# Patient Record
Sex: Female | Born: 1970 | Race: Black or African American | Hispanic: No | Marital: Married | Smoking: Never smoker
Health system: Southern US, Community
[De-identification: ages and names within clinical notes are randomized; demographics above are authoritative.]

## PROBLEM LIST (undated history)

## (undated) DIAGNOSIS — Z940 Kidney transplant status: Secondary | ICD-10-CM

## (undated) DIAGNOSIS — Z9889 Other specified postprocedural states: Secondary | ICD-10-CM

## (undated) DIAGNOSIS — E119 Type 2 diabetes mellitus without complications: Secondary | ICD-10-CM

## (undated) DIAGNOSIS — E785 Hyperlipidemia, unspecified: Secondary | ICD-10-CM

## (undated) DIAGNOSIS — E11319 Type 2 diabetes mellitus with unspecified diabetic retinopathy without macular edema: Secondary | ICD-10-CM

## (undated) DIAGNOSIS — I2510 Atherosclerotic heart disease of native coronary artery without angina pectoris: Secondary | ICD-10-CM

## (undated) DIAGNOSIS — I739 Peripheral vascular disease, unspecified: Secondary | ICD-10-CM

## (undated) DIAGNOSIS — I10 Essential (primary) hypertension: Secondary | ICD-10-CM

## (undated) DIAGNOSIS — N289 Disorder of kidney and ureter, unspecified: Secondary | ICD-10-CM

## (undated) DIAGNOSIS — Z8669 Personal history of other diseases of the nervous system and sense organs: Secondary | ICD-10-CM

## (undated) DIAGNOSIS — Z8673 Personal history of transient ischemic attack (TIA), and cerebral infarction without residual deficits: Secondary | ICD-10-CM

## (undated) DIAGNOSIS — K5900 Constipation, unspecified: Secondary | ICD-10-CM

## (undated) DIAGNOSIS — J189 Pneumonia, unspecified organism: Secondary | ICD-10-CM

## (undated) DIAGNOSIS — D649 Anemia, unspecified: Secondary | ICD-10-CM

## (undated) HISTORY — PX: CARDIAC CATHETERIZATION: SHX172

## (undated) HISTORY — PX: EYE SURGERY: SHX253

---

## 1997-07-19 ENCOUNTER — Ambulatory Visit (HOSPITAL_COMMUNITY): Admission: RE | Admit: 1997-07-19 | Discharge: 1997-07-19 | Payer: Self-pay | Admitting: Obstetrics and Gynecology

## 1997-11-18 ENCOUNTER — Inpatient Hospital Stay (HOSPITAL_COMMUNITY): Admission: AD | Admit: 1997-11-18 | Discharge: 1997-11-18 | Payer: Self-pay | Admitting: Obstetrics and Gynecology

## 1997-11-25 ENCOUNTER — Inpatient Hospital Stay (HOSPITAL_COMMUNITY): Admission: AD | Admit: 1997-11-25 | Discharge: 1997-11-25 | Payer: Self-pay | Admitting: *Deleted

## 1997-12-08 ENCOUNTER — Inpatient Hospital Stay (HOSPITAL_COMMUNITY): Admission: AD | Admit: 1997-12-08 | Discharge: 1997-12-13 | Payer: Self-pay | Admitting: Obstetrics and Gynecology

## 1997-12-13 ENCOUNTER — Encounter (HOSPITAL_COMMUNITY): Admission: RE | Admit: 1997-12-13 | Discharge: 1998-03-13 | Payer: Self-pay | Admitting: Obstetrics and Gynecology

## 1998-03-16 ENCOUNTER — Encounter (HOSPITAL_COMMUNITY): Admission: RE | Admit: 1998-03-16 | Discharge: 1998-06-14 | Payer: Self-pay | Admitting: *Deleted

## 1998-12-14 ENCOUNTER — Emergency Department (HOSPITAL_COMMUNITY): Admission: EM | Admit: 1998-12-14 | Discharge: 1998-12-14 | Payer: Self-pay | Admitting: *Deleted

## 1998-12-20 ENCOUNTER — Other Ambulatory Visit: Admission: RE | Admit: 1998-12-20 | Discharge: 1998-12-20 | Payer: Self-pay | Admitting: Obstetrics and Gynecology

## 2002-01-07 ENCOUNTER — Other Ambulatory Visit: Admission: RE | Admit: 2002-01-07 | Discharge: 2002-01-07 | Payer: Self-pay | Admitting: Obstetrics and Gynecology

## 2002-07-17 ENCOUNTER — Emergency Department (HOSPITAL_COMMUNITY): Admission: EM | Admit: 2002-07-17 | Discharge: 2002-07-17 | Payer: Self-pay | Admitting: Emergency Medicine

## 2002-07-17 ENCOUNTER — Encounter: Payer: Self-pay | Admitting: Emergency Medicine

## 2003-05-25 ENCOUNTER — Encounter: Admission: RE | Admit: 2003-05-25 | Discharge: 2003-05-25 | Payer: Self-pay | Admitting: Internal Medicine

## 2005-01-25 ENCOUNTER — Encounter: Admission: RE | Admit: 2005-01-25 | Discharge: 2005-01-25 | Payer: Self-pay | Admitting: Internal Medicine

## 2006-01-18 ENCOUNTER — Emergency Department (HOSPITAL_COMMUNITY): Admission: EM | Admit: 2006-01-18 | Discharge: 2006-01-18 | Payer: Self-pay | Admitting: Emergency Medicine

## 2006-05-19 ENCOUNTER — Emergency Department (HOSPITAL_COMMUNITY): Admission: EM | Admit: 2006-05-19 | Discharge: 2006-05-20 | Payer: Self-pay | Admitting: Emergency Medicine

## 2006-05-25 ENCOUNTER — Emergency Department (HOSPITAL_COMMUNITY): Admission: EM | Admit: 2006-05-25 | Discharge: 2006-05-25 | Payer: Self-pay | Admitting: Emergency Medicine

## 2006-05-29 ENCOUNTER — Encounter: Admission: RE | Admit: 2006-05-29 | Discharge: 2006-07-11 | Payer: Self-pay | Admitting: Internal Medicine

## 2006-11-01 ENCOUNTER — Encounter: Admission: RE | Admit: 2006-11-01 | Discharge: 2006-11-01 | Payer: Self-pay | Admitting: Obstetrics

## 2008-06-24 ENCOUNTER — Emergency Department (HOSPITAL_COMMUNITY): Admission: EM | Admit: 2008-06-24 | Discharge: 2008-06-24 | Payer: Self-pay | Admitting: Emergency Medicine

## 2009-02-26 HISTORY — PX: OTHER SURGICAL HISTORY: SHX169

## 2009-04-28 ENCOUNTER — Ambulatory Visit: Payer: Self-pay | Admitting: Vascular Surgery

## 2009-05-03 ENCOUNTER — Ambulatory Visit: Payer: Self-pay | Admitting: Vascular Surgery

## 2009-05-03 ENCOUNTER — Ambulatory Visit (HOSPITAL_COMMUNITY): Admission: RE | Admit: 2009-05-03 | Discharge: 2009-05-03 | Payer: Self-pay | Admitting: Vascular Surgery

## 2009-05-10 ENCOUNTER — Ambulatory Visit: Payer: Self-pay | Admitting: Vascular Surgery

## 2009-06-02 ENCOUNTER — Encounter: Admission: RE | Admit: 2009-06-02 | Discharge: 2009-06-02 | Payer: Self-pay | Admitting: Nephrology

## 2009-06-09 ENCOUNTER — Ambulatory Visit: Payer: Self-pay | Admitting: Vascular Surgery

## 2010-05-22 LAB — GLUCOSE, CAPILLARY: Glucose-Capillary: 164 mg/dL — ABNORMAL HIGH (ref 70–99)

## 2010-05-22 LAB — POCT I-STAT 4, (NA,K, GLUC, HGB,HCT)
HCT: 35 % — ABNORMAL LOW (ref 36.0–46.0)
Potassium: 4.7 mEq/L (ref 3.5–5.1)

## 2010-06-04 ENCOUNTER — Other Ambulatory Visit (HOSPITAL_COMMUNITY): Payer: Self-pay | Admitting: Nephrology

## 2010-06-04 ENCOUNTER — Ambulatory Visit (HOSPITAL_COMMUNITY)
Admission: RE | Admit: 2010-06-04 | Discharge: 2010-06-04 | Disposition: A | Discharge status: Home / Self Care | Payer: 59 | Source: Ambulatory Visit | Attending: Nephrology | Admitting: Nephrology

## 2010-06-04 DIAGNOSIS — N186 End stage renal disease: Secondary | ICD-10-CM

## 2010-06-04 DIAGNOSIS — E119 Type 2 diabetes mellitus without complications: Secondary | ICD-10-CM | POA: Insufficient documentation

## 2010-06-04 DIAGNOSIS — Y832 Surgical operation with anastomosis, bypass or graft as the cause of abnormal reaction of the patient, or of later complication, without mention of misadventure at the time of the procedure: Secondary | ICD-10-CM | POA: Insufficient documentation

## 2010-06-04 DIAGNOSIS — T82898A Other specified complication of vascular prosthetic devices, implants and grafts, initial encounter: Secondary | ICD-10-CM | POA: Insufficient documentation

## 2010-06-04 DIAGNOSIS — I120 Hypertensive chronic kidney disease with stage 5 chronic kidney disease or end stage renal disease: Secondary | ICD-10-CM | POA: Insufficient documentation

## 2010-06-04 MED ORDER — IOHEXOL 300 MG/ML  SOLN
100.0000 mL | Freq: Once | INTRAMUSCULAR | Status: AC | PRN
  Administered 2010-06-04: 40 mL via INTRAVENOUS

## 2010-06-05 ENCOUNTER — Other Ambulatory Visit (HOSPITAL_COMMUNITY): Payer: Self-pay

## 2010-06-05 ENCOUNTER — Emergency Department (HOSPITAL_COMMUNITY)
Admission: EM | Admit: 2010-06-05 | Discharge: 2010-06-06 | Disposition: A | Discharge status: Other Acute Inpatient Hospital | Payer: 59 | Attending: Emergency Medicine | Admitting: Emergency Medicine

## 2010-06-05 DIAGNOSIS — R45851 Suicidal ideations: Secondary | ICD-10-CM | POA: Insufficient documentation

## 2010-06-05 DIAGNOSIS — Z794 Long term (current) use of insulin: Secondary | ICD-10-CM | POA: Insufficient documentation

## 2010-06-05 DIAGNOSIS — F329 Major depressive disorder, single episode, unspecified: Secondary | ICD-10-CM | POA: Insufficient documentation

## 2010-06-05 DIAGNOSIS — E785 Hyperlipidemia, unspecified: Secondary | ICD-10-CM | POA: Insufficient documentation

## 2010-06-05 DIAGNOSIS — F3289 Other specified depressive episodes: Secondary | ICD-10-CM | POA: Insufficient documentation

## 2010-06-05 DIAGNOSIS — E119 Type 2 diabetes mellitus without complications: Secondary | ICD-10-CM | POA: Insufficient documentation

## 2010-06-05 DIAGNOSIS — I120 Hypertensive chronic kidney disease with stage 5 chronic kidney disease or end stage renal disease: Secondary | ICD-10-CM | POA: Insufficient documentation

## 2010-06-05 DIAGNOSIS — Z79899 Other long term (current) drug therapy: Secondary | ICD-10-CM | POA: Insufficient documentation

## 2010-06-05 DIAGNOSIS — N186 End stage renal disease: Secondary | ICD-10-CM | POA: Insufficient documentation

## 2010-06-05 LAB — URINE MICROSCOPIC-ADD ON

## 2010-06-05 LAB — URINALYSIS, ROUTINE W REFLEX MICROSCOPIC
Glucose, UA: 250 mg/dL — AB
Hgb urine dipstick: NEGATIVE
Protein, ur: >300 mg/dL — AB
pH: 8 (ref 5.0–8.0)

## 2010-06-05 LAB — DIFFERENTIAL
Basophils Relative: 0 % (ref 0–1)
Eosinophils Absolute: 0 10*3/uL (ref 0.0–0.7)
Eosinophils Relative: 0 % (ref 0–5)
Lymphocytes Relative: 13 % (ref 12–46)
Lymphs Abs: 1.9 10*3/uL (ref 0.7–4.0)
Monocytes Absolute: 1.1 10*3/uL — ABNORMAL HIGH (ref 0.1–1.0)
Neutro Abs: 12 10*3/uL — ABNORMAL HIGH (ref 1.7–7.7)
Neutrophils Relative %: 80 % — ABNORMAL HIGH (ref 43–77)

## 2010-06-05 LAB — CBC
HCT: 32.2 % — ABNORMAL LOW (ref 36.0–46.0)
MCH: 28.3 pg (ref 26.0–34.0)
Platelets: 470 10*3/uL — ABNORMAL HIGH (ref 150–400)
RBC: 3.85 MIL/uL — ABNORMAL LOW (ref 3.87–5.11)
RDW: 13.2 % (ref 11.5–15.5)

## 2010-06-05 LAB — RAPID URINE DRUG SCREEN, HOSP PERFORMED
Barbiturates: NOT DETECTED
Benzodiazepines: POSITIVE — AB

## 2010-06-05 LAB — ETHANOL: Alcohol, Ethyl (B): <5 mg/dL (ref 0–10)

## 2010-06-06 ENCOUNTER — Ambulatory Visit (HOSPITAL_COMMUNITY)
Admission: RE | Admit: 2010-06-06 | Discharge: 2010-06-06 | Disposition: A | Discharge status: Home / Self Care | Payer: 59 | Source: Ambulatory Visit | Attending: Nephrology | Admitting: Nephrology

## 2010-06-06 ENCOUNTER — Inpatient Hospital Stay (HOSPITAL_COMMUNITY)
Admission: RE | Admit: 2010-06-06 | Discharge: 2010-06-07 | DRG: 881 | Disposition: A | Discharge status: Home / Self Care | Payer: 59 | Source: Ambulatory Visit | Attending: Psychiatry | Admitting: Psychiatry

## 2010-06-06 DIAGNOSIS — E119 Type 2 diabetes mellitus without complications: Secondary | ICD-10-CM

## 2010-06-06 DIAGNOSIS — T46904A Poisoning by unspecified agents primarily affecting the cardiovascular system, undetermined, initial encounter: Secondary | ICD-10-CM

## 2010-06-06 DIAGNOSIS — T50992A Poisoning by other drugs, medicaments and biological substances, intentional self-harm, initial encounter: Secondary | ICD-10-CM

## 2010-06-06 DIAGNOSIS — F3289 Other specified depressive episodes: Secondary | ICD-10-CM

## 2010-06-06 DIAGNOSIS — N186 End stage renal disease: Secondary | ICD-10-CM | POA: Insufficient documentation

## 2010-06-06 DIAGNOSIS — N189 Chronic kidney disease, unspecified: Secondary | ICD-10-CM

## 2010-06-06 DIAGNOSIS — R45851 Suicidal ideations: Secondary | ICD-10-CM

## 2010-06-06 DIAGNOSIS — F329 Major depressive disorder, single episode, unspecified: Principal | ICD-10-CM

## 2010-06-06 LAB — GLUCOSE, CAPILLARY: Glucose-Capillary: 70 mg/dL (ref 70–99)

## 2010-06-06 LAB — COMPREHENSIVE METABOLIC PANEL
Albumin: 4 g/dL (ref 3.5–5.2)
Alkaline Phosphatase: 61 U/L (ref 39–117)
CO2: 28 mEq/L (ref 19–32)
Chloride: 97 mEq/L (ref 96–112)
GFR calc Af Amer: 12 mL/min — ABNORMAL LOW (ref >60)
GFR calc non Af Amer: 10 mL/min — ABNORMAL LOW (ref >60)
Glucose, Bld: 170 mg/dL — ABNORMAL HIGH (ref 70–99)
Total Protein: 7.9 g/dL (ref 6.0–8.3)

## 2010-06-06 LAB — RENAL FUNCTION PANEL
CO2: 27 mEq/L (ref 19–32)
Chloride: 97 mEq/L (ref 96–112)
GFR calc Af Amer: 8 mL/min — ABNORMAL LOW (ref >60)
GFR calc non Af Amer: 7 mL/min — ABNORMAL LOW (ref >60)
Glucose, Bld: 225 mg/dL — ABNORMAL HIGH (ref 70–99)
Potassium: 3.3 mEq/L — ABNORMAL LOW (ref 3.5–5.1)
Sodium: 137 mEq/L (ref 135–145)

## 2010-06-06 LAB — CBC
HCT: 31 % — ABNORMAL LOW (ref 36.0–46.0)
Hemoglobin: 10.2 g/dL — ABNORMAL LOW (ref 12.0–15.0)
RBC: 3.59 MIL/uL — ABNORMAL LOW (ref 3.87–5.11)
WBC: 10.8 10*3/uL — ABNORMAL HIGH (ref 4.0–10.5)

## 2010-06-07 LAB — GLUCOSE, CAPILLARY
Glucose-Capillary: 155 mg/dL — ABNORMAL HIGH (ref 70–99)
Glucose-Capillary: 270 mg/dL — ABNORMAL HIGH (ref 70–99)

## 2010-06-13 ENCOUNTER — Ambulatory Visit: Payer: 59 | Admitting: Vascular Surgery

## 2010-06-28 ENCOUNTER — Encounter (INDEPENDENT_AMBULATORY_CARE_PROVIDER_SITE_OTHER): Payer: 59

## 2010-06-28 ENCOUNTER — Ambulatory Visit: Payer: 59 | Admitting: Vascular Surgery

## 2010-06-28 ENCOUNTER — Ambulatory Visit (INDEPENDENT_AMBULATORY_CARE_PROVIDER_SITE_OTHER): Payer: 59 | Admitting: Vascular Surgery

## 2010-06-28 DIAGNOSIS — N186 End stage renal disease: Secondary | ICD-10-CM

## 2010-06-28 DIAGNOSIS — Z01810 Encounter for preprocedural cardiovascular examination: Secondary | ICD-10-CM

## 2010-06-29 NOTE — Assessment & Plan Note (Signed)
OFFICE VISIT  Morgan [name], Morgan [name] DOB:  11/09/1970                                       06/28/2010 [id number]  I saw patient in the office today to evaluate her for new access.  This is a pleasant 40 year old woman who had a left upper arm basilic vein transposition placed on 05/03/09.  This had been working nicely; however, she presented in April of this year with an occluded fistula. She underwent thrombolysis by interventional radiologist.  It was noted that the vein was diffusely small in caliber with significant diffuse stenoses.  There was no central venous stenosis.  The patient underwent balloon dilatation of a diffusely stenotic basilic vein, but the graft occluded.  There was noted to be some contrast extravasation at the time of the procedure also.  They did not feel that the fistula could be salvaged, and she had a dialysis catheter placed.  This was on 06/06/10. She has been using her dialysis catheter for access and comes in to be evaluated for new access.  Of note, she has had no recent uremic symptoms.  Specifically, she denies fatigue, anorexia, nausea, vomiting, palpitations, or significant shortness of breath.  SOCIAL HISTORY:  She is married.  She has 1 child.  She does not use tobacco.  REVIEW OF SYSTEMS:  CARDIOVASCULAR:  She had no chest pain, chest pressure, palpitations or arrhythmias. PULMONARY:  She has had no productive cough, bronchitis, asthma or wheezing.  PHYSICAL EXAMINATION:  This is a pleasant 40 year old woman who appears her stated age.  Temperature is 97.4, blood pressure 115/64, heart rate is 101.  Lungs are clear bilaterally to auscultation.  Cardiovascular: She has a regular rate and rhythm.  She has palpable brachial and radial pulse bilaterally.  Abdomen:  Soft and nontender with normal-pitched bowel sounds.  Neurologic:  She has no focal weakness or paresthesias. Skin exam reveals an occluded left upper  arm basilic vein transposition with a sclerotic, thrombosed basilic vein.  I have reviewed her fistulogram from April, which shows multiple areas of stenosis on the fistula diffusely which were ballooned but the graft occluded.  I have also independently interpreted a vein map of the right upper extremity today which shows that her forearm and upper arm cephalic vein in the right arm are very small.  Likewise, the basilic vein is quite small on the right.  I do not see a good option for a fistula in the right arm.  I have recommend we place an AV graft in the left arm.  We can then remove the catheter on the right in 1 month.  We have discussed the procedure and potential complications in the office today, and she is agreeable to proceed.  She is scheduled to have this done on 07/07/10.    Judeth Cornfield. Scot Dock, M.D. Electronically Signed  CSD/MEDQ  D:  06/28/2010  T:  06/29/2010  Job:  [id number]  cc:   Elzie Rings. Lorrene Reid, M.D.

## 2010-07-06 NOTE — Procedures (Unsigned)
CEPHALIC VEIN MAPPING  INDICATION:  Preoperative  vein mapping of the right arm.  HISTORY:  EXAM: The right cephalic vein is compressible.  Diameter measurements range from 0.14 to 0.25 cm.  The right basilic vein is compressible.  Diameter measurements range from 0.19 to 0.26 cm.  See attached worksheet for all measurements.  IMPRESSION:  Patent right cephalic and basilic veins with diameter measurements as described above.  ___________________________________________ Judeth Cornfield. Scot Dock, M.D.  CH/MEDQ  D:  06/29/2010  T:  06/29/2010  Job:  [id number]

## 2010-07-11 NOTE — Consult Note (Signed)
NEW PATIENT CONSULTATION   Morgan [name], Morgan [name]  DOB:  [birth year]                                       04/28/2009  [id number]   I saw the patient in the office today in consultation for hemodialysis  access.  She was referred by Dr. Lorrene Reid.  This is a pleasant 40 year old  left-handed woman with chronic kidney disease secondary to diabetic  nephropathy.  She is not yet on dialysis.  She has had progressive  worsening renal insufficiency and Dr. Lorrene Reid has sent her for evaluation  for access.  Of note, she has had no recent uremic symptoms.  She has  had no nausea or vomiting or weakness.  She has had no significant  shortness of breath.   PAST MEDICAL HISTORY:  Is otherwise significant for diabetes which has  been stable on her current medications.  She does require insulin.  She  has also had a history of a retinal detachment in 2008 but has had no  further problems with this since then.  Past medical history is  otherwise fairly unremarkable.  She denies any history of hypertension,  hypercholesterolemia, history of previous myocardial infarction, history  of congestive heart failure or history of COPD.   FAMILY HISTORY:  Her grandmother had diabetes and high blood pressure.  She is unaware of any history of premature cardiovascular disease.   SOCIAL HISTORY:  She is married.  She has one child.  She does not use  tobacco.   MEDICATIONS:  Her medications are documented on the medical history form  in her chart.   ALLERGIES:  IV dye.   REVIEW OF SYSTEMS:  GENERAL:  She has had no recent weight loss, weight  gain or problems with her appetite.  CARDIOVASCULAR:  She has had no  chest pain, chest pressure, palpitations or arrhythmias.  She has had no  significant dyspnea on exertion.  She has had no claudication, rest pain  or nonhealing ulcers.  She has had no history of stroke, TIAs or  amaurosis fugax.  She has had no history of DVT or phlebitis.  Pulmonary, GI, GU, neurologic, hematologic and integumentary view of  systems is unremarkable.  MUSCULOSKELETAL:  She does have occasional muscle pain.  PSYCHIATRIC:  She has occasional depression.  ENT:  She has had problems with her vision in the past but no recent new  problems.   PHYSICAL EXAMINATION:  General:  This is a pleasant 40 year old woman  who appears her stated age.  Vital signs:  Her blood pressure is 138/85,  heart rate is 85, temperature 98.2.  HEENT:  Unremarkable.  Lungs:  Are  clear bilaterally to auscultation without rales, rhonchi or wheezing.  Cardiovascular:  I do not detect any carotid bruits.  She has a regular  rate and rhythm without murmur appreciated.  She has no significant  peripheral edema.  She has palpable femoral pulses with warm and well-  perfused feet.  She has palpable brachial and radial pulse bilaterally.  Abdomen:  Soft and nontender with normal pitched bowel sounds.  No  masses appreciated.  Musculoskeletal:  There are no major deformities or  cyanosis.  Neurological:  There is no focal weakness or paresthesias.  Skin:  There are no ulcers or rashes.   I have independently interpreted her vein mapping which shows that her  cephalic  vein in the upper arm and forearm bilaterally is very small and  does not appear to be usable for a fistula.  Likewise the basilic vein  on the right side appears fairly small.  The largest vein she has for  potential fistula is her left upper arm basilic vein with diameters  ranging from 0.25 to 0.32 cm.   I have also reviewed her lab studies which show PTH is 94.  Creatinine  is 2.64 on 04/15/2009.   I have recommended we explore her upper arm basilic vein on her left arm  and if this is adequate do a basilic transposition on the left.  If this  is not adequate we would place an AV graft.  I have discussed how we  perform the basilic transposition and the incisions involved.  We have  also discussed the  potential complications of surgery including but not  limited to failure of the fistula to mature, graft thrombosis, graft  infection, steal syndrome, wound healing problems, arm swelling and  bleeding.  All of her questions were answered and she is agreeable to  proceed.  Her surgery has been scheduled for 05/03/2009.     Judeth Cornfield. Scot Dock, M.D.  Electronically Signed   CSD/MEDQ  D:  04/28/2009  T:  04/29/2009  Job:  [id number]   cc:   Elzie Rings. Lorrene Reid, M.D.

## 2010-07-11 NOTE — Assessment & Plan Note (Signed)
OFFICE VISIT   Morgan [name], Morgan [name]  DOB:  03/10/1970                                       06/09/2009  [id number]   I saw the patient in the office today for followup after her left upper  arm basilic vein transposition which was done on 05/03/2009.  She has  been doing well and her only complaint is some paresthesia along the  left medial forearm.  She has had no pain in her hand.   On examination the incisions are healed nicely.  She has an excellent  thrill in her fistula.  She has a palpable radial pulse.  Overall I am  pleased with her progress and I think the vein appears to be maturing  gradually.  Hopefully this will be ready for access if and when she  needs dialysis.  I will see her back p.r.n.     Judeth Cornfield. Scot Dock, M.D.  Electronically Signed   CSD/MEDQ  D:  06/09/2009  T:  06/10/2009  Job:  [id number]   cc:   [facility]

## 2010-07-11 NOTE — Assessment & Plan Note (Signed)
OFFICE VISIT   Morgan [name], Morgan [name]  DOB:  Sep 02, 1970                                       05/10/2009  [id number]   Patient is a 40 year old black female with worsening chronic kidney  disease secondary to diabetic nephropathy.  She is not yet on dialysis.  She is status post a left basilic vein transposition by Dr. Deitra Mayo on 05/03/2009.  She is here today regarding concerns of left arm  pain and swelling.  She reports that after surgery she did have some  mild numbness in her left fingers, 1 through 3.  This is now beginning  to improve.  Her strength in her left hand was also decreased after  surgery, and she feels this is also improving.  She is most concerned  about fairly persistent sensation of pins and needles in her left  forearm.  The pain is 6/10 at times, and she does get overall relief  with 1 oxycodone.  Despite the pins and needles sensation, she says her  forearm and left upper arm biceps region continues to feel numb.  She  says the numbness in her left upper arm actually started to improve  today.  She reports that she has been fatigued since surgery and has had  a 14-pound weight gain in the past week and is now at 186 pounds.  She  denies any shortness of breath.  She says she is not due to see Dr.  Lorrene Reid for up approximately 4 months.   PHYSICAL EXAM FINDINGS:  Blood pressure 158/85, heart rate 96,  respirations 20.  She is alert, pleasant, and in no acute distress.  Her  heart has a regular rate and rhythm.  Lung sounds are clear.  Bilateral  upper extremities showed 2+ radial pulses.  She has an ulnar Doppler  signal in her left hand and faint palmar arch signals as well.  Her  fingertips are cool, but this is symmetric bilaterally.  Grips are  overall strong, but there is slight decrease in her left hand.  She has  mild generalized swelling, more pronounced in the upper arm on the left.  There is still mild bruising  of the medial left upper arm.  The basilic  vein fistula has a good thrill.   Patient is 1 week out following left basilic vein transposition.  As  mentioned, she is not yet on dialysis.  In regards to her right arm pain  and numbness, this seems more related to intraoperative nerve  irritation.  I suspect this will improve in the next few weeks to  months.  At this time, she does not exhibit any significant symptoms of  steal.  I do believe she did initially have a mild steal, as evidenced  by her left hand weakness and numbness, both which she says are  improving.  She has an easily palpable radial pulse on the left, and no  acute ischemic changes were noted in her left hand.  I have prescribed  her another 20 pills of oxycodone 5 mg 1-2 tablets p.o. q.4h. p.r.n. for  pain.  She has an appointment to see Dr. Scot Dock on 06/09/2009 at 10:30  a.m.  Will keep this as scheduled but she was instructed to call sooner  if her left upper extremity pain worsens or if she feels that  her left  hand feels like it is going to sleep, becomes persistently cool, or  other symptoms of steal.  In regards to her recent weight gain as well  as her increased feeling of fatigue and due to the fact that both legs  feel tight with edema, I have asked her to contact Dr. Sanda Klein office  today regarding having her seen sooner rather than later, as she seems  to be exhibiting some uremic symptoms.   Jacinta Shoe, PA   Nelda Severe. Kellie Simmering, M.D.  Electronically Signed   AWZ/MEDQ  D:  05/10/2009  T:  05/11/2009  Job:  [id number]   cc:   Elzie Rings. Lorrene Reid, M.D.

## 2010-07-11 NOTE — Procedures (Signed)
CEPHALIC VEIN MAPPING   INDICATION:  Evaluation for an AV fistula placement.   HISTORY:  Renal failure.   EXAM:   The right cephalic vein is compressible.   Diameter measurements range from 0.15 to 0.16 cm.   The right basilic vein is compressible.   Diameter measurements range from 0.15 to 0.19 cm.   The left cephalic vein is compressible.   Diameter measurements range from 0.13 to 0.15 cm.   The left basilic vein is compressible.   Diameter measurements range from 0.25 to 0.32 in the upper arm only.   See attached worksheet for all measurements.   IMPRESSION:  Patent's bilateral basilic and cephalic veins are not of  acceptable diameter for use as a dialysis access site.   ___________________________________________  Judeth Cornfield. Scot Dock, M.D.   CJ/MEDQ  D:  04/28/2009  T:  04/28/2009  Job:  [id number]

## 2010-07-17 ENCOUNTER — Emergency Department (HOSPITAL_COMMUNITY): Payer: 59

## 2010-07-17 ENCOUNTER — Emergency Department (HOSPITAL_COMMUNITY)
Admission: EM | Admit: 2010-07-17 | Discharge: 2010-07-17 | Disposition: A | Discharge status: Home / Self Care | Payer: 59 | Attending: Emergency Medicine | Admitting: Emergency Medicine

## 2010-07-17 DIAGNOSIS — I10 Essential (primary) hypertension: Secondary | ICD-10-CM | POA: Insufficient documentation

## 2010-07-17 DIAGNOSIS — S92909A Unspecified fracture of unspecified foot, initial encounter for closed fracture: Secondary | ICD-10-CM | POA: Insufficient documentation

## 2010-07-17 DIAGNOSIS — E119 Type 2 diabetes mellitus without complications: Secondary | ICD-10-CM | POA: Insufficient documentation

## 2010-07-17 DIAGNOSIS — Y92009 Unspecified place in unspecified non-institutional (private) residence as the place of occurrence of the external cause: Secondary | ICD-10-CM | POA: Insufficient documentation

## 2010-07-17 DIAGNOSIS — Z794 Long term (current) use of insulin: Secondary | ICD-10-CM | POA: Insufficient documentation

## 2010-07-17 DIAGNOSIS — W108XXA Fall (on) (from) other stairs and steps, initial encounter: Secondary | ICD-10-CM | POA: Insufficient documentation

## 2010-07-17 DIAGNOSIS — IMO0002 Reserved for concepts with insufficient information to code with codable children: Secondary | ICD-10-CM | POA: Insufficient documentation

## 2010-07-17 LAB — GLUCOSE, CAPILLARY: Glucose-Capillary: 192 mg/dL — ABNORMAL HIGH (ref 70–99)

## 2010-07-28 HISTORY — PX: ARTERIOVENOUS GRAFT PLACEMENT: SUR1029

## 2010-08-11 ENCOUNTER — Ambulatory Visit (HOSPITAL_COMMUNITY): Payer: 59

## 2010-08-11 ENCOUNTER — Ambulatory Visit (HOSPITAL_COMMUNITY)
Admission: RE | Admit: 2010-08-11 | Discharge: 2010-08-11 | Disposition: A | Discharge status: Home / Self Care | Payer: 59 | Source: Ambulatory Visit | Attending: Vascular Surgery | Admitting: Vascular Surgery

## 2010-08-11 DIAGNOSIS — Z794 Long term (current) use of insulin: Secondary | ICD-10-CM | POA: Insufficient documentation

## 2010-08-11 DIAGNOSIS — Z79899 Other long term (current) drug therapy: Secondary | ICD-10-CM | POA: Insufficient documentation

## 2010-08-11 DIAGNOSIS — Z01812 Encounter for preprocedural laboratory examination: Secondary | ICD-10-CM | POA: Insufficient documentation

## 2010-08-11 DIAGNOSIS — I120 Hypertensive chronic kidney disease with stage 5 chronic kidney disease or end stage renal disease: Secondary | ICD-10-CM | POA: Insufficient documentation

## 2010-08-11 DIAGNOSIS — N186 End stage renal disease: Secondary | ICD-10-CM

## 2010-08-11 DIAGNOSIS — Z01818 Encounter for other preprocedural examination: Secondary | ICD-10-CM | POA: Insufficient documentation

## 2010-08-11 DIAGNOSIS — E119 Type 2 diabetes mellitus without complications: Secondary | ICD-10-CM | POA: Insufficient documentation

## 2010-08-11 LAB — GLUCOSE, CAPILLARY
Glucose-Capillary: 99 mg/dL (ref 70–99)
Glucose-Capillary: 91 mg/dL (ref 70–99)

## 2010-08-11 LAB — POCT I-STAT 4, (NA,K, GLUC, HGB,HCT)
Glucose, Bld: 122 mg/dL — ABNORMAL HIGH (ref 70–99)
Hemoglobin: 16.3 g/dL — ABNORMAL HIGH (ref 12.0–15.0)
Potassium: 4.5 mEq/L (ref 3.5–5.1)
Sodium: 138 mEq/L (ref 135–145)

## 2010-08-11 LAB — SURGICAL PCR SCREEN: Staphylococcus aureus: NEGATIVE

## 2010-08-16 NOTE — Op Note (Signed)
  [name], Morgan [name]                ACCOUNT NO.:  [id number]  MEDICAL RECORD NO.:  [id number]  LOCATION:  [facility]                         FACILITY:  [facility]  PHYSICIAN:  Judeth Cornfield. Scot Dock, M.D.DATE OF BIRTH:  [birth year]  DATE OF PROCEDURE:  08/11/2010 DATE OF DISCHARGE:                              OPERATIVE REPORT   PREOPERATIVE DIAGNOSIS:  Chronic kidney disease.  POSTOPERATIVE DIAGNOSIS:  Chronic kidney disease.  PROCEDURE:  Placement of new left upper arm AV graft.  SURGEON:  Judeth Cornfield. Scot Dock, MD  ASSISTANT:  None.  ANESTHESIA:  Local with sedation.  TECHNIQUE:  The patient was taken to the operating room and sedated by Anesthesia.  The left upper extremity was prepped and draped in usual sterile fashion.  After the skin was infiltrated with 1% lidocaine, a longitudinal incision was made just above the antecubital level and here the artery was dissected free.  It was very small artery and interrogated with a Doppler and it was clear that the patient had a high bifurcation of the brachial artery.  I therefore elected to place an upper arm loop.  After the skin was anesthetized, a longitudinal incision was made in the axilla.  The high brachial vein was dissected free.  However, this too was a very small artery, only about 2.5 mm in diameter.  The adjacent brachial vein which was lateral was also very small.  However, this was the only remaining option for graft in this arm and therefore I elected to proceed with placement of an upper arm loop graft.  A 4-7 mm graft tunneled in a loop fashion in the upper arm with the arterial aspect of the graft along the ulnar aspect of the upper arm.  The patient was then heparinized.  The brachial artery was clamped proximally, distally, and a longitudinal arteriotomy was made. A segment of 4 mm of the graft was excised.  The graft was spatulated and sewn end-to-side to the artery using continuous 6-0 Prolene suture. Graft sewn  to the appropriate length for anastomosis to the high brachial vein.  The vein was ligated distally and then spatulated, was sewn end-to-end to the vein using continuous 6-0 Prolene suture.  At the completion, there was a palpable thrill in the graft.  Hemostasis was obtained in the wounds.  The wounds were closed with deep layer of 3-0 Vicryl.  The skin was closed with 4-0 Vicryl.  Sterile dressing was applied.  The patient tolerated the procedure well and was transferred to the recovery room in stable condition.  All needle and sponge counts were correct.    Judeth Cornfield. Scot Dock, M.D.    CSD/MEDQ  D:  08/11/2010  T:  08/12/2010  Job:  [id number]  Electronically Signed by Deitra Mayo M.D. on 08/16/2010 08:52:48 AM

## 2010-09-19 ENCOUNTER — Other Ambulatory Visit (HOSPITAL_COMMUNITY): Payer: Self-pay | Admitting: Nephrology

## 2010-09-19 DIAGNOSIS — N186 End stage renal disease: Secondary | ICD-10-CM

## 2010-09-22 ENCOUNTER — Other Ambulatory Visit (HOSPITAL_COMMUNITY): Payer: Self-pay | Admitting: Nephrology

## 2010-09-22 ENCOUNTER — Ambulatory Visit (HOSPITAL_COMMUNITY)
Admission: RE | Admit: 2010-09-22 | Discharge: 2010-09-22 | Disposition: A | Discharge status: Home / Self Care | Payer: 59 | Source: Ambulatory Visit | Attending: Nephrology | Admitting: Nephrology

## 2010-09-22 DIAGNOSIS — Z452 Encounter for adjustment and management of vascular access device: Secondary | ICD-10-CM | POA: Insufficient documentation

## 2010-09-22 DIAGNOSIS — N186 End stage renal disease: Secondary | ICD-10-CM

## 2010-12-28 DIAGNOSIS — E113599 Type 2 diabetes mellitus with proliferative diabetic retinopathy without macular edema, unspecified eye: Secondary | ICD-10-CM | POA: Insufficient documentation

## 2010-12-28 DIAGNOSIS — H35341 Macular cyst, hole, or pseudohole, right eye: Secondary | ICD-10-CM | POA: Insufficient documentation

## 2011-02-01 DIAGNOSIS — IMO0002 Reserved for concepts with insufficient information to code with codable children: Secondary | ICD-10-CM | POA: Insufficient documentation

## 2011-06-04 DIAGNOSIS — F339 Major depressive disorder, recurrent, unspecified: Secondary | ICD-10-CM | POA: Diagnosis not present

## 2011-06-12 ENCOUNTER — Other Ambulatory Visit: Payer: Self-pay | Admitting: Vascular Surgery

## 2011-06-15 DIAGNOSIS — F339 Major depressive disorder, recurrent, unspecified: Secondary | ICD-10-CM | POA: Diagnosis not present

## 2011-06-17 IMAGING — CR DG KNEE COMPLETE 4+V*R*
4 series · 4 of 4 positions shown · non-contrast
Comparison: None.

CLINICAL DATA: Right knee pain secondary to a fall yesterday.

RIGHT KNEE - COMPLETE 4+ VIEW

[t knee ap right]
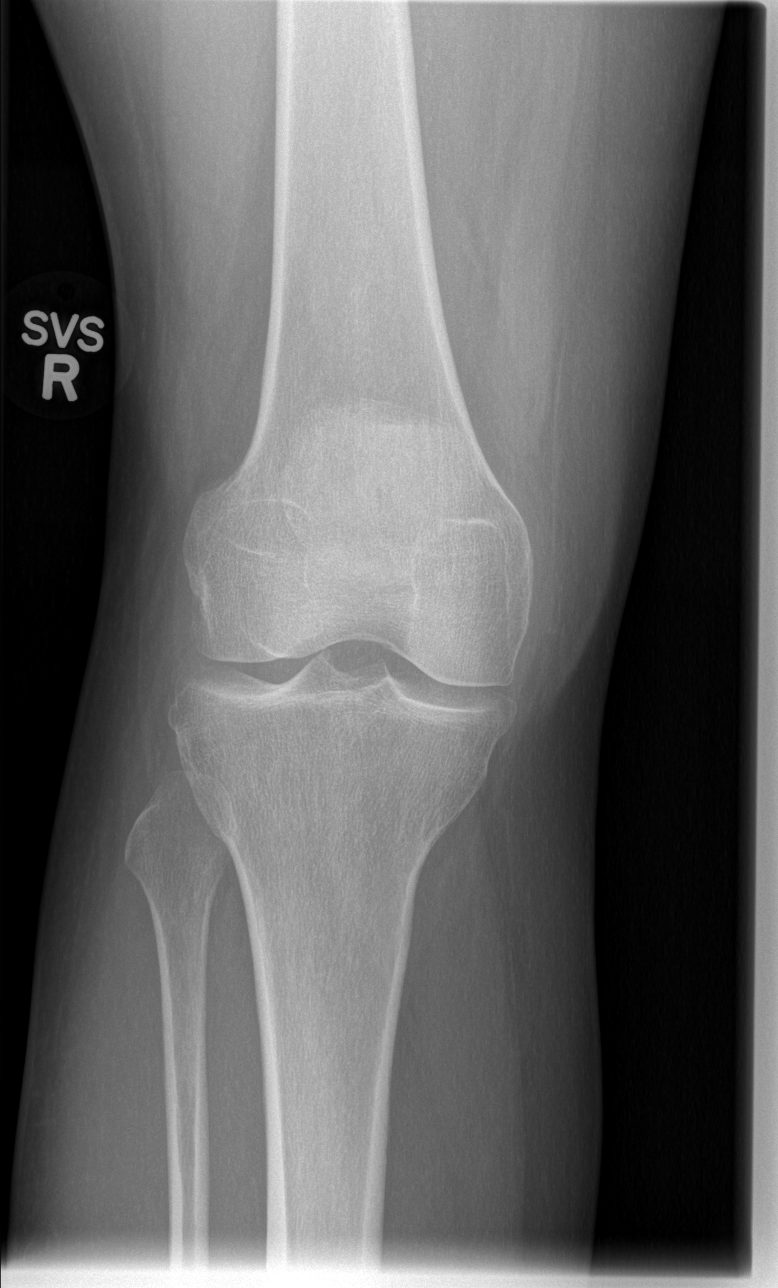

[t knee oblique right (1 of 2)]
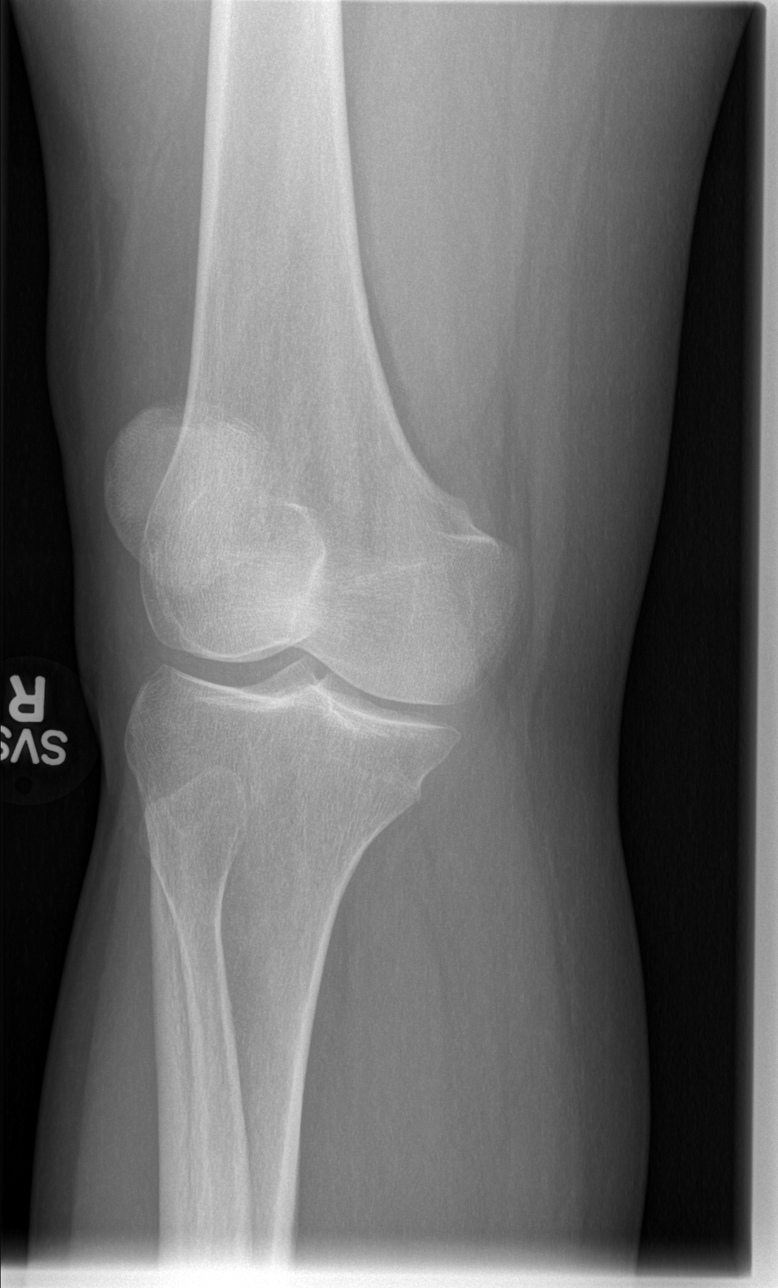

[t knee oblique right (2 of 2)]
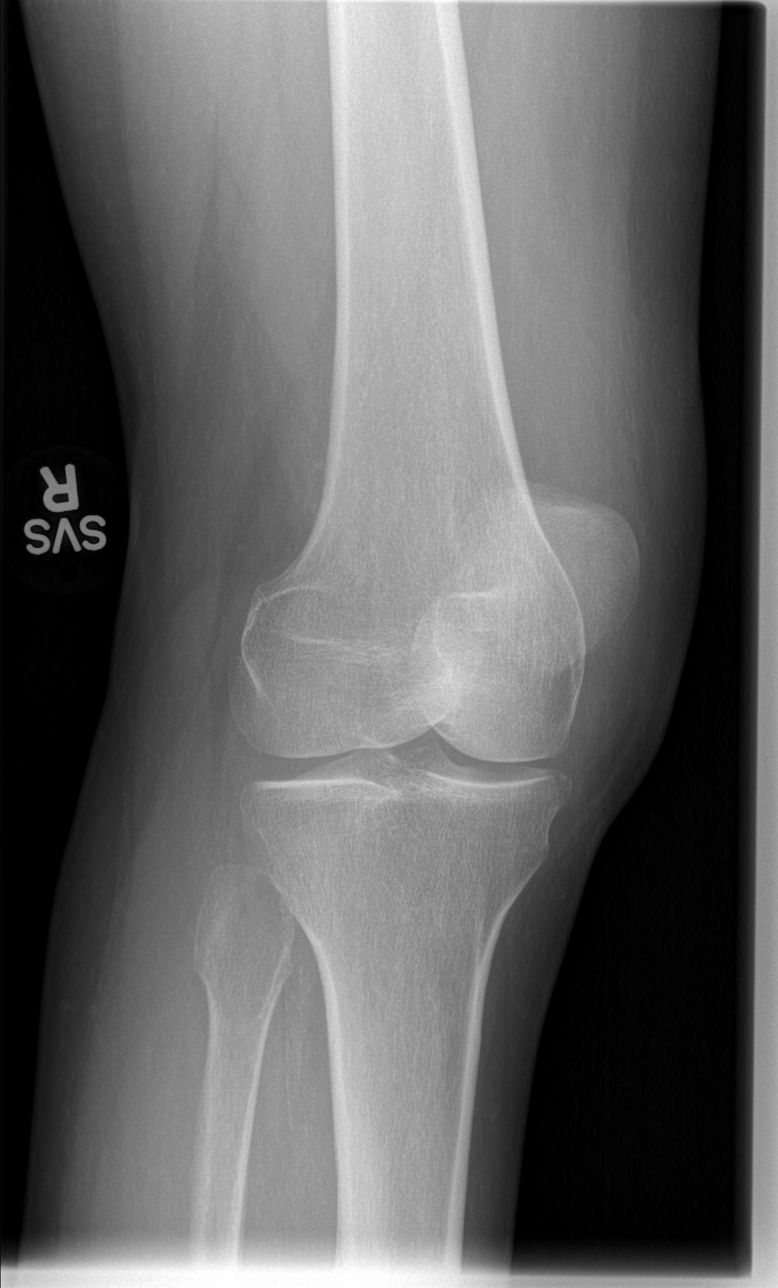

[t knee lat right]
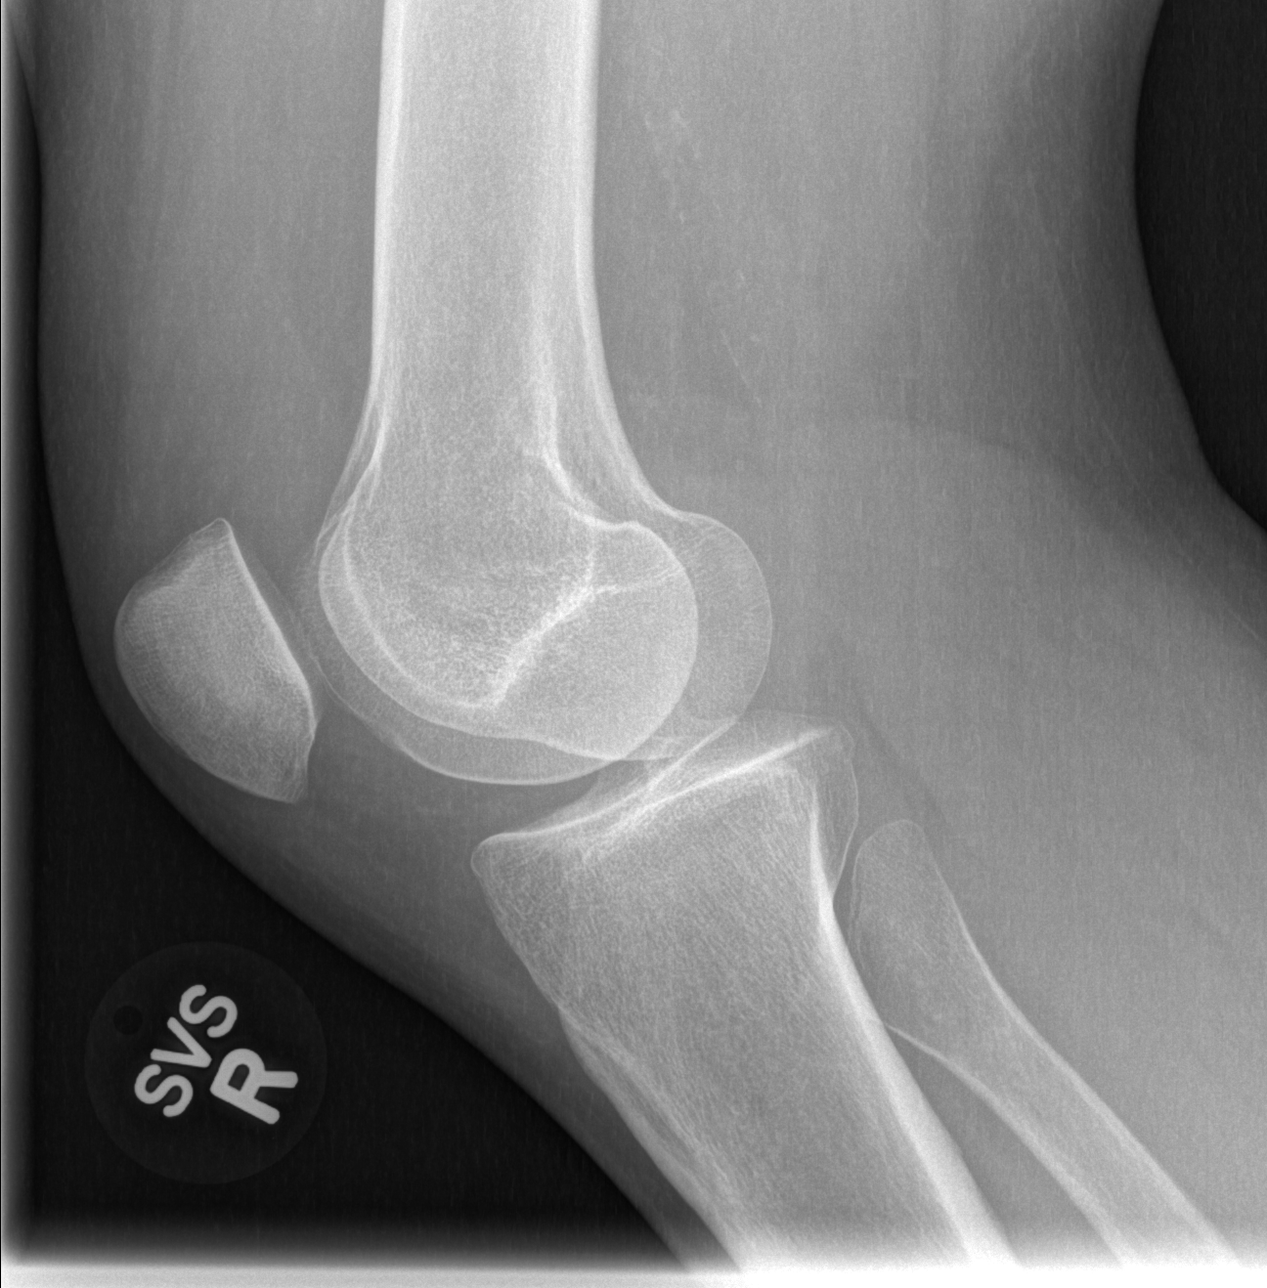

[4 of 4 positions shown; findings below may reference images not displayed]

FINDINGS: There is no fracture or dislocation.  However, there is a
prominent knee joint effusion.
IMPRESSION: No osseous abnormality.  Knee effusion.

## 2011-06-17 IMAGING — CR DG FOOT COMPLETE 3+V*L*
3 series · 3 of 3 positions shown · non-contrast
Comparison: None.

CLINICAL DATA: Dorsal foot pain secondary to a fall.

LEFT FOOT - COMPLETE 3+ VIEW

[t foot ap left]
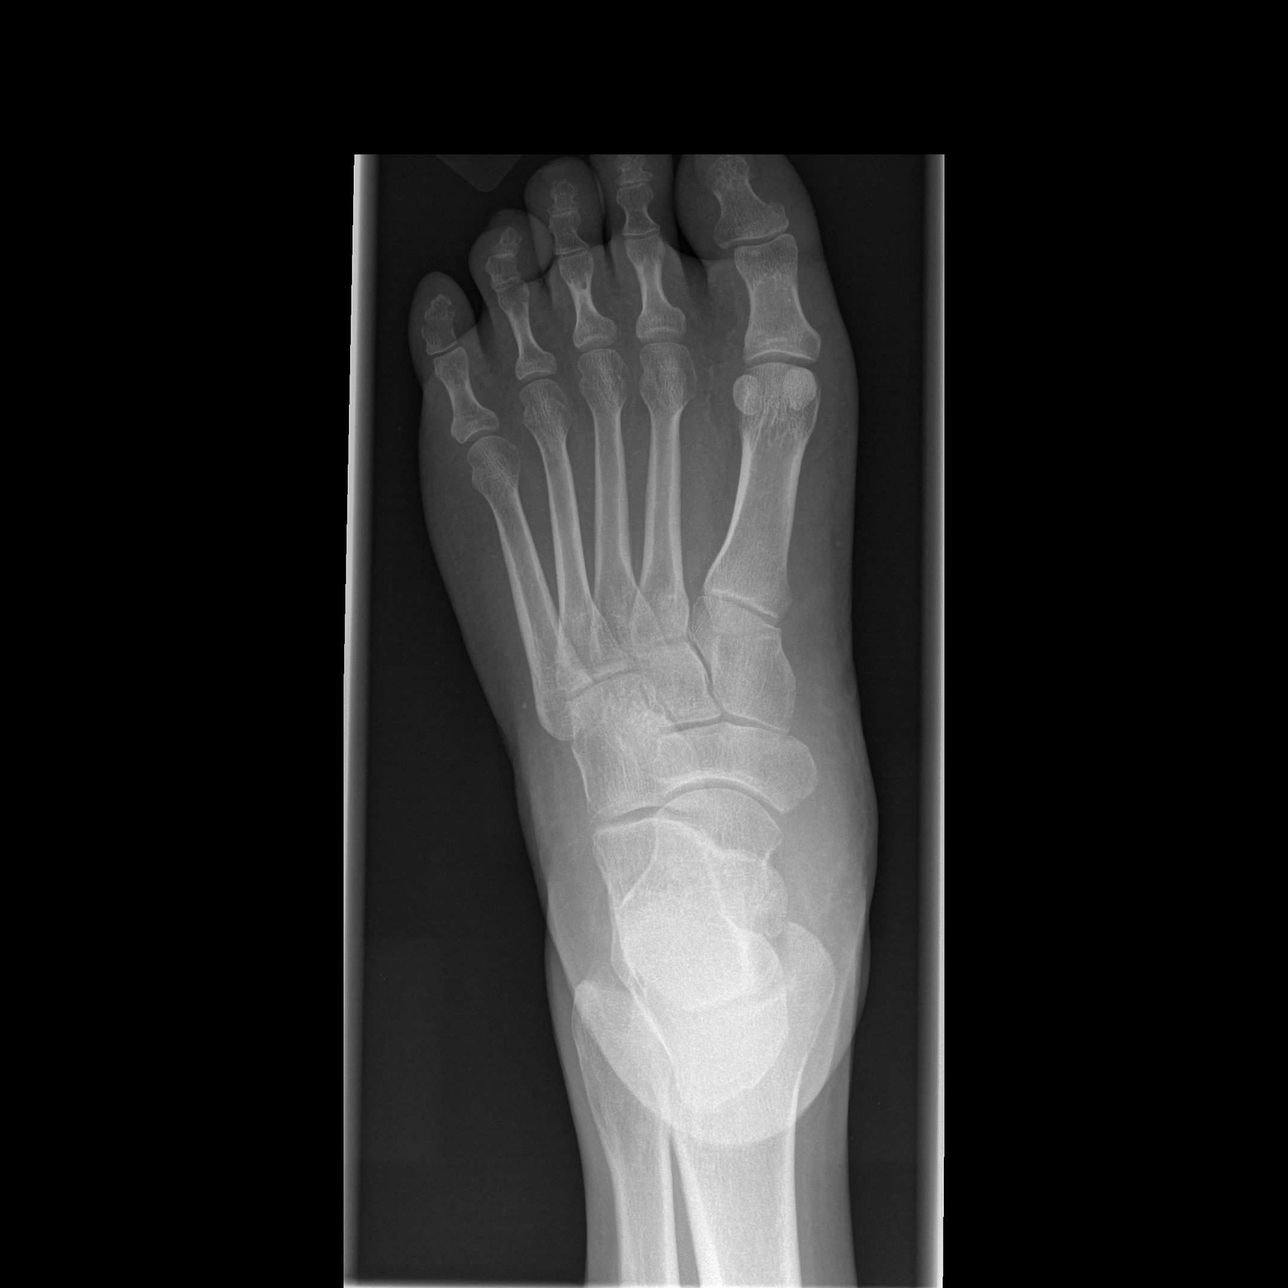

[t foot oblique left]
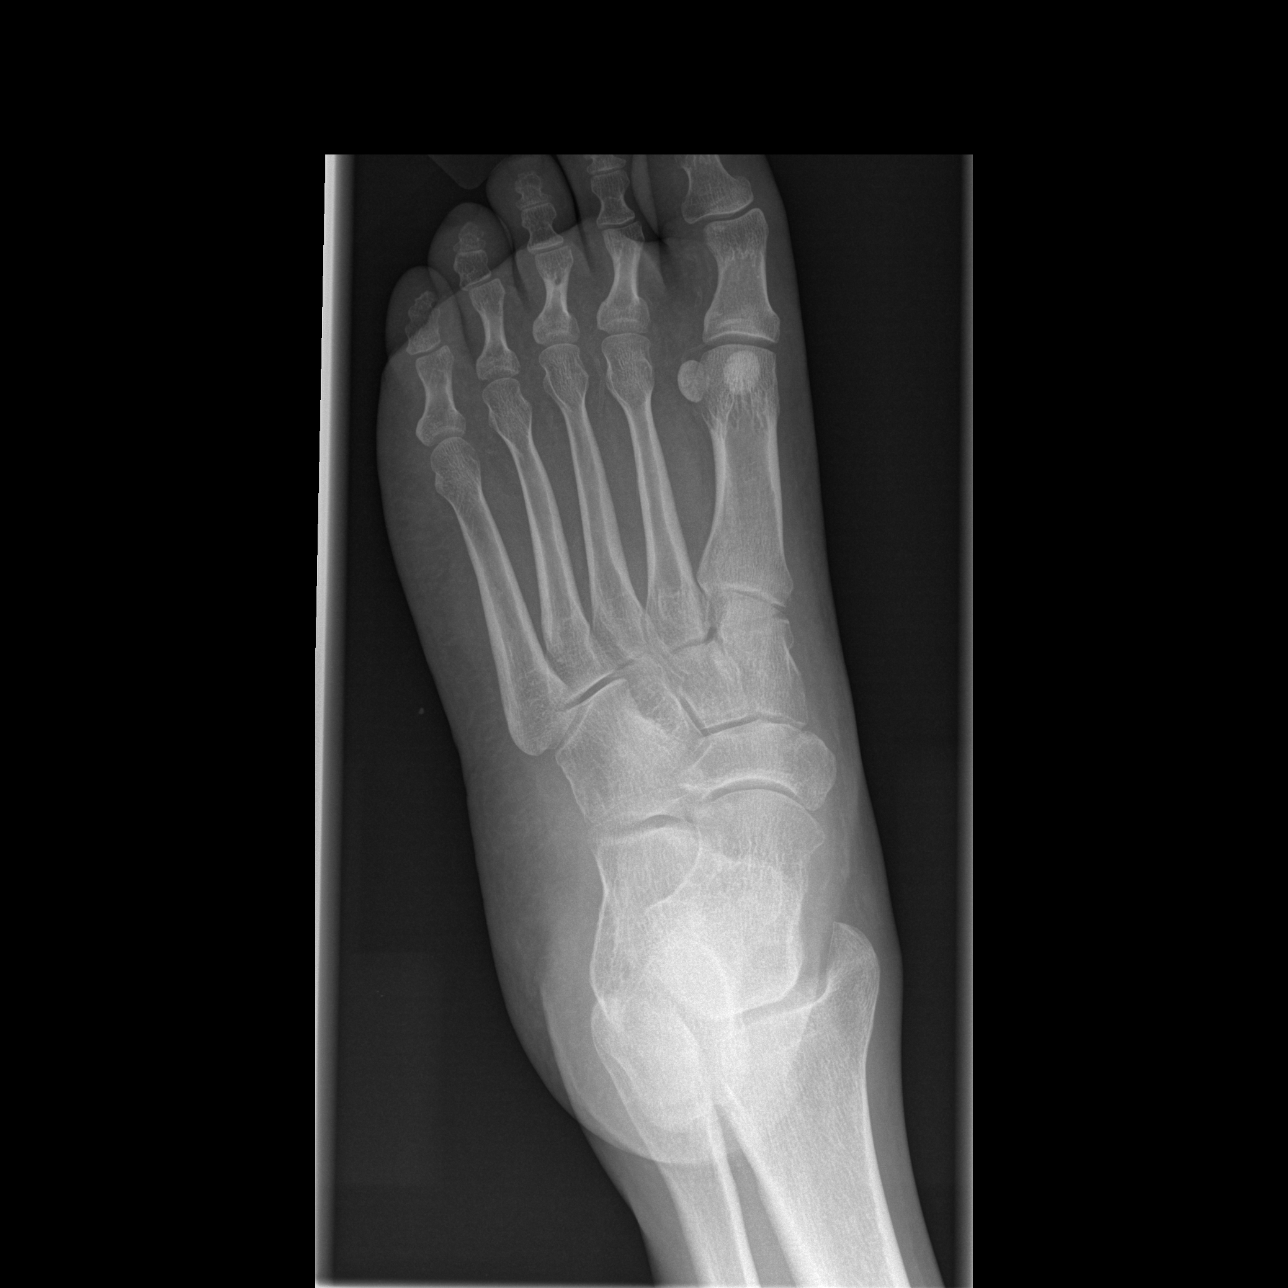

[t foot lat left]
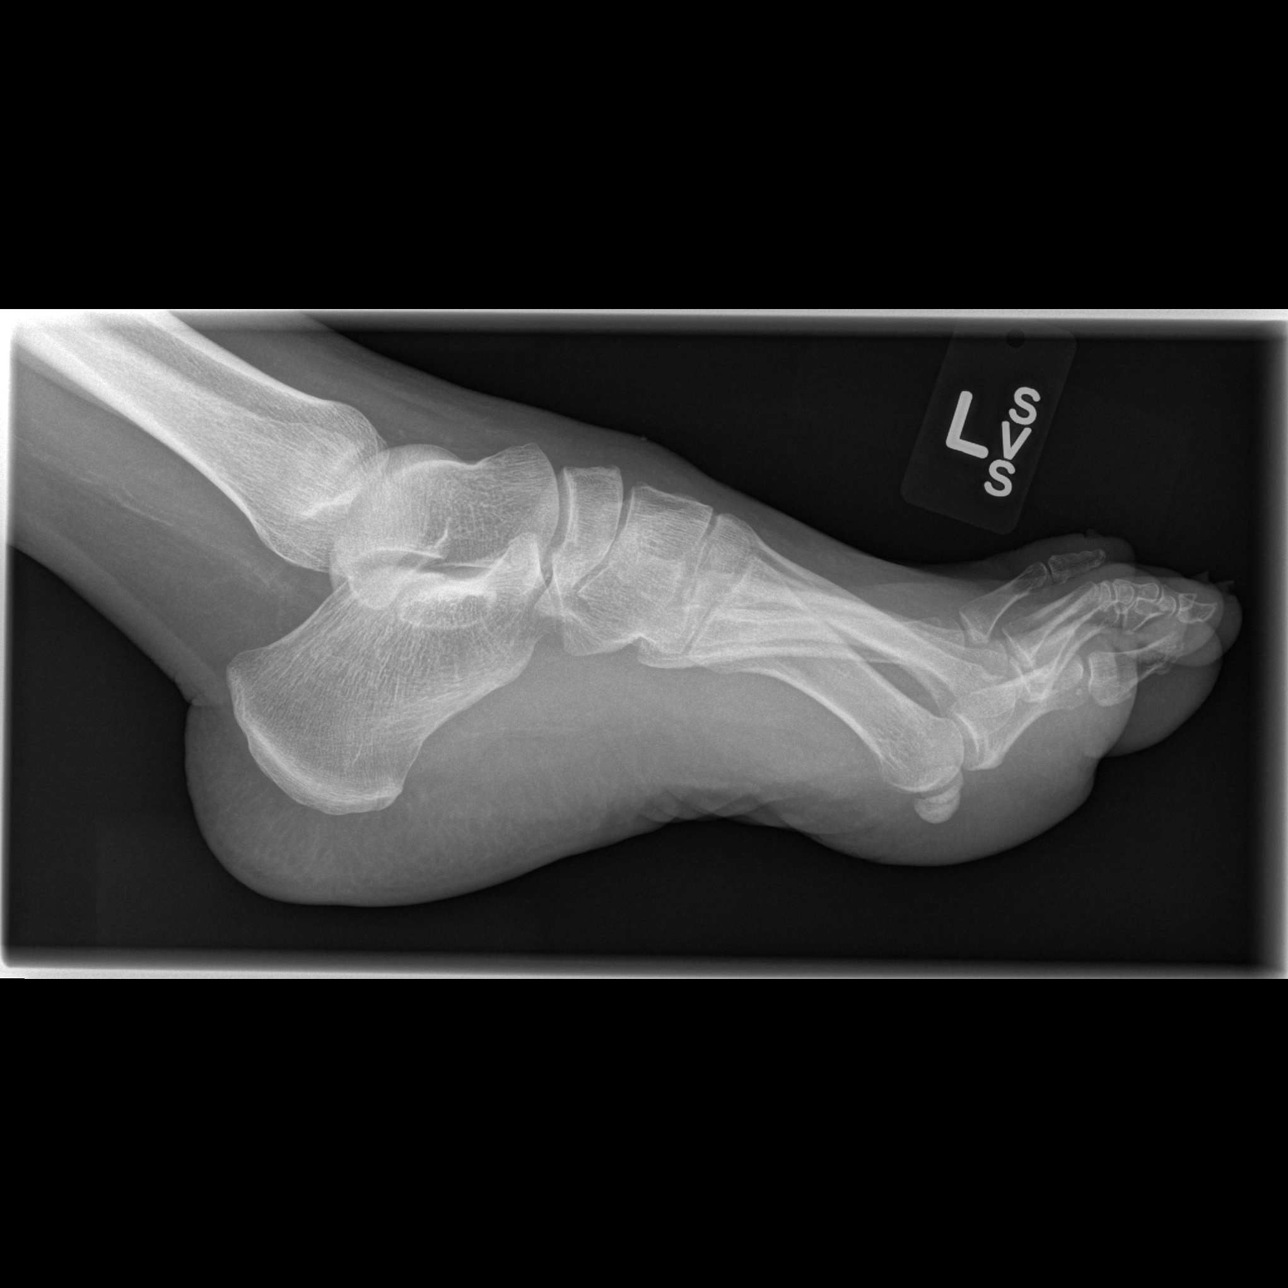

[3 of 3 positions shown; findings below may reference images not displayed]

FINDINGS: There is irregularity of the medial aspect of the
navicular with what appears be a hairline fracture.  This is not
definitive but there is soft tissue swelling over that area as
well.  The other bones of the foot appear normal.
IMPRESSION: Probable hairline fracture of the medial aspect of the navicular.

## 2011-06-20 DIAGNOSIS — F339 Major depressive disorder, recurrent, unspecified: Secondary | ICD-10-CM | POA: Diagnosis not present

## 2011-06-27 DIAGNOSIS — F339 Major depressive disorder, recurrent, unspecified: Secondary | ICD-10-CM | POA: Diagnosis not present

## 2011-07-04 DIAGNOSIS — F339 Major depressive disorder, recurrent, unspecified: Secondary | ICD-10-CM | POA: Diagnosis not present

## 2011-07-18 DIAGNOSIS — F339 Major depressive disorder, recurrent, unspecified: Secondary | ICD-10-CM | POA: Diagnosis not present

## 2011-07-30 ENCOUNTER — Emergency Department (HOSPITAL_COMMUNITY): Payer: No Typology Code available for payment source

## 2011-07-30 ENCOUNTER — Emergency Department (HOSPITAL_COMMUNITY)
Admission: EM | Admit: 2011-07-30 | Discharge: 2011-07-30 | Disposition: A | Discharge status: Home / Self Care | Payer: No Typology Code available for payment source | Attending: Emergency Medicine | Admitting: Emergency Medicine

## 2011-07-30 DIAGNOSIS — M542 Cervicalgia: Secondary | ICD-10-CM | POA: Insufficient documentation

## 2011-07-30 DIAGNOSIS — M25519 Pain in unspecified shoulder: Secondary | ICD-10-CM | POA: Insufficient documentation

## 2011-07-30 DIAGNOSIS — M25569 Pain in unspecified knee: Secondary | ICD-10-CM | POA: Insufficient documentation

## 2011-07-30 DIAGNOSIS — Y92410 Unspecified street and highway as the place of occurrence of the external cause: Secondary | ICD-10-CM | POA: Insufficient documentation

## 2011-07-30 MED ORDER — TRAMADOL HCL 50 MG PO TABS: 50.0000 mg | ORAL_TABLET | Freq: Four times a day (QID) | ORAL | Status: AC | PRN

## 2011-07-30 MED ORDER — CYCLOBENZAPRINE HCL 10 MG PO TABS: 10.0000 mg | ORAL_TABLET | Freq: Two times a day (BID) | ORAL | Status: AC | PRN

## 2011-07-30 NOTE — ED Provider Notes (Signed)
Medical screening examination/treatment/procedure(s) were performed by non-physician practitioner and as supervising physician I was immediately available for consultation/collaboration.   Kathalene Frames, MD 07/30/11 [phone]
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
## 2011-08-11 ENCOUNTER — Ambulatory Visit (INDEPENDENT_AMBULATORY_CARE_PROVIDER_SITE_OTHER): Payer: BC Managed Care – PPO | Admitting: Family Medicine

## 2011-08-11 VITALS — BP 109/72 | HR 125 | Temp 101.8°F | Resp 18 | Ht 61.75 in | Wt 181.0 lb

## 2011-08-11 DIAGNOSIS — R070 Pain in throat: Secondary | ICD-10-CM

## 2011-08-11 DIAGNOSIS — R509 Fever, unspecified: Secondary | ICD-10-CM

## 2011-08-11 DIAGNOSIS — E119 Type 2 diabetes mellitus without complications: Secondary | ICD-10-CM

## 2011-08-11 DIAGNOSIS — J029 Acute pharyngitis, unspecified: Secondary | ICD-10-CM

## 2011-08-11 DIAGNOSIS — J020 Streptococcal pharyngitis: Secondary | ICD-10-CM

## 2011-08-11 LAB — POCT CBC
Hemoglobin: 11.2 g/dL — AB (ref 12.2–16.2)
MCH, POC: 27.9 pg (ref 27–31.2)
MPV: 8.3 fL (ref 0–99.8)
POC Granulocyte: 10 — AB (ref 2–6.9)
POC MID %: 7.4 %M (ref 0–12)
RBC: 4.01 M/uL — AB (ref 4.04–5.48)
WBC: 12.1 10*3/uL — AB (ref 4.6–10.2)

## 2011-08-11 LAB — GLUCOSE, POCT (MANUAL RESULT ENTRY): POC Glucose: 121 mg/dl — AB (ref 70–99)

## 2011-08-11 LAB — POCT RAPID STREP A (OFFICE): Rapid Strep A Screen: POSITIVE — AB

## 2011-08-11 MED ORDER — PENICILLIN G BENZATHINE 1200000 UNIT/2ML IM SUSP
600000.0000 [IU] | Freq: Once | INTRAMUSCULAR | Status: AC
  Administered 2011-08-11: 600000 [IU] via INTRAMUSCULAR
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
## 2011-08-12 ENCOUNTER — Ambulatory Visit (INDEPENDENT_AMBULATORY_CARE_PROVIDER_SITE_OTHER): Payer: BC Managed Care – PPO | Admitting: Family Medicine

## 2011-08-12 VITALS — BP 144/81 | HR 105 | Temp 98.1°F | Resp 16

## 2011-08-12 DIAGNOSIS — J020 Streptococcal pharyngitis: Secondary | ICD-10-CM

## 2011-08-12 NOTE — Progress Notes (Signed)
     Patient Name: Morgan [name] Date of Birth: [birth year] Medical Record Number: [id number] Gender: female Date of Encounter: 08/12/2011  History of Present Illness:  Morgan [name] is a 41 y.o. very pleasant female patient who presents with the following:  Here today to follow- up illness- diagnosed with strep throat and treated with bicillin yesterday. She feels "so much better."  Fever is resolved, she is able to swallow and is generally doing a lot better today  There is no problem list on file for this patient.  No past medical history on file. No past surgical history on file. History  Substance Use Topics  . Smoking status: Never Smoker   . Smokeless tobacco: Never Used  . Alcohol Use: Not on file   No family history on file. Allergies  Allergen Reactions  . Iohexol      Code: HIVES, Desc: ITCHING, HIVES, SWELLING- 13 HR PRE-MEDS REQUIRED- ASM, Onset Date: [id number]     Medication list has been reviewed and updated.  Prior to Admission medications   Medication Sig Start Date End Date Taking? Authorizing Provider  cinacalcet (SENSIPAR) 30 MG tablet Take 60 mg by mouth daily.   Yes Historical Provider, MD  insulin glargine (LANTUS) 100 UNIT/ML injection Inject 30 Units into the skin daily.   Yes Historical Provider, MD  pioglitazone (ACTOS) 15 MG tablet Take 15 mg by mouth daily.   Yes Historical Provider, MD  sevelamer (RENVELA) 800 MG tablet Take 2,400 mg by mouth 3 (three) times daily with meals.   Yes Historical Provider, MD    Review of Systems:  As per HPI- otherwise negative.Marland Kitchen  Physical Examination: Filed Vitals:   08/12/11 0916  BP: 144/81  Pulse: 105  Temp: 98.1 F (36.7 C)  Resp: 16  pulse recheck about 80 BPM There were no vitals filed for this visit. There is no height or weight on file to calculate BMI. Ideal Body Weight:    GEN: WDWN, NAD, Non-toxic, A & O x 3, overweight HEENT: Atraumatic, Normocephalic. Neck supple. No masses, No LAD.   Oropharyngeal swelling is much better, exudate gone.  TM wnl bilaterally  Ears and Nose: No external deformity. CV: RRR, No M/G/R. No JVD. No thrill. No extra heart sounds. PULM: CTA B, no wheezes, crackles, rhonchi. No retractions. No resp. distress. No accessory muscle use. ABD: S, NT, ND, +BS. No rebound. No HSM. EXTR: No c/c/e NEURO Normal gait.  PSYCH: Normally interactive. Conversant. Not depressed or anxious appearing.  Calm demeanor.    Assessment and Plan: 1. Strep throat    Much better!  Continue supportive care at home, let me know if getting worse again.    Lamar Blinks, MD

## 2011-08-17 LAB — CULTURE, BLOOD, SINGLE SET ONLY: Organism ID, Bacteria: NO GROWTH

## 2011-08-23 DIAGNOSIS — F339 Major depressive disorder, recurrent, unspecified: Secondary | ICD-10-CM | POA: Diagnosis not present

## 2011-08-28 DIAGNOSIS — F339 Major depressive disorder, recurrent, unspecified: Secondary | ICD-10-CM | POA: Diagnosis not present

## 2011-09-10 DIAGNOSIS — F339 Major depressive disorder, recurrent, unspecified: Secondary | ICD-10-CM | POA: Diagnosis not present

## 2011-10-18 DIAGNOSIS — E11359 Type 2 diabetes mellitus with proliferative diabetic retinopathy without macular edema: Secondary | ICD-10-CM | POA: Diagnosis not present

## 2011-10-18 DIAGNOSIS — H35349 Macular cyst, hole, or pseudohole, unspecified eye: Secondary | ICD-10-CM | POA: Diagnosis not present

## 2011-10-18 DIAGNOSIS — H2510 Age-related nuclear cataract, unspecified eye: Secondary | ICD-10-CM | POA: Diagnosis not present

## 2012-01-16 DIAGNOSIS — E119 Type 2 diabetes mellitus without complications: Secondary | ICD-10-CM | POA: Insufficient documentation

## 2012-05-08 ENCOUNTER — Encounter (HOSPITAL_COMMUNITY): Payer: Self-pay | Admitting: Emergency Medicine

## 2012-05-08 ENCOUNTER — Inpatient Hospital Stay (HOSPITAL_COMMUNITY)
Admission: EM | Admit: 2012-05-08 | Discharge: 2012-05-12 | DRG: 449 | Disposition: A | Discharge status: Home / Self Care | Payer: BC Managed Care – PPO | Attending: Internal Medicine | Admitting: Internal Medicine

## 2012-05-08 ENCOUNTER — Emergency Department (HOSPITAL_COMMUNITY): Payer: BC Managed Care – PPO

## 2012-05-08 DIAGNOSIS — Z992 Dependence on renal dialysis: Secondary | ICD-10-CM

## 2012-05-08 DIAGNOSIS — N2581 Secondary hyperparathyroidism of renal origin: Secondary | ICD-10-CM | POA: Diagnosis present

## 2012-05-08 DIAGNOSIS — T383X1A Poisoning by insulin and oral hypoglycemic [antidiabetic] drugs, accidental (unintentional), initial encounter: Principal | ICD-10-CM

## 2012-05-08 DIAGNOSIS — T7421XA Adult sexual abuse, confirmed, initial encounter: Secondary | ICD-10-CM

## 2012-05-08 DIAGNOSIS — T50901A Poisoning by unspecified drugs, medicaments and biological substances, accidental (unintentional), initial encounter: Secondary | ICD-10-CM

## 2012-05-08 DIAGNOSIS — IMO0002 Reserved for concepts with insufficient information to code with codable children: Secondary | ICD-10-CM

## 2012-05-08 DIAGNOSIS — T7421XS Adult sexual abuse, confirmed, sequela: Secondary | ICD-10-CM

## 2012-05-08 DIAGNOSIS — N186 End stage renal disease: Secondary | ICD-10-CM

## 2012-05-08 DIAGNOSIS — T1491XA Suicide attempt, initial encounter: Secondary | ICD-10-CM

## 2012-05-08 DIAGNOSIS — E162 Hypoglycemia, unspecified: Secondary | ICD-10-CM | POA: Diagnosis present

## 2012-05-08 DIAGNOSIS — E1129 Type 2 diabetes mellitus with other diabetic kidney complication: Secondary | ICD-10-CM | POA: Diagnosis present

## 2012-05-08 DIAGNOSIS — Z794 Long term (current) use of insulin: Secondary | ICD-10-CM

## 2012-05-08 DIAGNOSIS — N19 Unspecified kidney failure: Secondary | ICD-10-CM

## 2012-05-08 DIAGNOSIS — D631 Anemia in chronic kidney disease: Secondary | ICD-10-CM | POA: Diagnosis present

## 2012-05-08 DIAGNOSIS — X838XXA Intentional self-harm by other specified means, initial encounter: Secondary | ICD-10-CM

## 2012-05-08 DIAGNOSIS — Z79899 Other long term (current) drug therapy: Secondary | ICD-10-CM

## 2012-05-08 DIAGNOSIS — F339 Major depressive disorder, recurrent, unspecified: Secondary | ICD-10-CM

## 2012-05-08 DIAGNOSIS — T50992A Poisoning by other drugs, medicaments and biological substances, intentional self-harm, initial encounter: Secondary | ICD-10-CM | POA: Diagnosis present

## 2012-05-08 DIAGNOSIS — E1169 Type 2 diabetes mellitus with other specified complication: Secondary | ICD-10-CM | POA: Diagnosis present

## 2012-05-08 DIAGNOSIS — F3189 Other bipolar disorder: Secondary | ICD-10-CM | POA: Diagnosis present

## 2012-05-08 DIAGNOSIS — E1029 Type 1 diabetes mellitus with other diabetic kidney complication: Secondary | ICD-10-CM

## 2012-05-08 HISTORY — DX: End stage renal disease: Z99.2

## 2012-05-08 HISTORY — DX: Poisoning by insulin and oral hypoglycemic (antidiabetic) drugs, accidental (unintentional), initial encounter: T38.3X1A

## 2012-05-08 HISTORY — DX: Type 2 diabetes mellitus without complications: E11.9

## 2012-05-08 HISTORY — DX: Disorder of kidney and ureter, unspecified: N28.9

## 2012-05-08 HISTORY — DX: Suicide attempt, initial encounter: T14.91XA

## 2012-05-08 HISTORY — DX: End stage renal disease: N18.6

## 2012-05-08 LAB — CBC WITH DIFFERENTIAL/PLATELET
Eosinophils Relative: 2 % (ref 0–5)
Lymphocytes Relative: 44 % (ref 12–46)
Lymphs Abs: 3.3 10*3/uL (ref 0.7–4.0)
MCV: 88 fL (ref 78.0–100.0)
Platelets: 312 10*3/uL (ref 150–400)
RBC: 4.75 MIL/uL (ref 3.87–5.11)
WBC: 7.5 10*3/uL (ref 4.0–10.5)

## 2012-05-08 LAB — ETHANOL: Alcohol, Ethyl (B): <11 mg/dL (ref 0–11)

## 2012-05-08 LAB — LIPASE, BLOOD: Lipase: 37 U/L (ref 11–59)

## 2012-05-08 LAB — GLUCOSE, CAPILLARY
Glucose-Capillary: 78 mg/dL (ref 70–99)
Glucose-Capillary: 56 mg/dL — ABNORMAL LOW (ref 70–99)

## 2012-05-08 LAB — COMPREHENSIVE METABOLIC PANEL
ALT: 21 U/L (ref 0–35)
Albumin: 3.5 g/dL (ref 3.5–5.2)
Alkaline Phosphatase: 95 U/L (ref 39–117)
Potassium: 4 mEq/L (ref 3.5–5.1)
Sodium: 137 mEq/L (ref 135–145)
Total Protein: 8 g/dL (ref 6.0–8.3)

## 2012-05-08 LAB — MRSA PCR SCREENING: MRSA by PCR: NEGATIVE

## 2012-05-08 LAB — SALICYLATE LEVEL: Salicylate Lvl: <2 mg/dL — ABNORMAL LOW (ref 2.8–20.0)

## 2012-05-08 LAB — ACETAMINOPHEN LEVEL: Acetaminophen (Tylenol), Serum: <15 ug/mL (ref 10–30)

## 2012-05-08 MED ORDER — DEXTROSE 10 % IV SOLN
INTRAVENOUS | Status: DC
  Administered 2012-05-08 – 2012-05-09 (×2): via INTRAVENOUS
  Filled 2012-05-08 (×2): qty 1000

## 2012-05-08 MED ORDER — DEXTROSE 50 % IV SOLN
50.0000 mL | Freq: Once | INTRAVENOUS | Status: AC
  Administered 2012-05-08: 50 mL via INTRAVENOUS
  Filled 2012-05-08: qty 50

## 2012-05-08 MED ORDER — ONDANSETRON HCL 4 MG/2ML IJ SOLN: 4.0000 mg | Freq: Four times a day (QID) | INTRAMUSCULAR | Status: DC | PRN

## 2012-05-08 MED ORDER — HEPARIN SODIUM (PORCINE) 5000 UNIT/ML IJ SOLN
5000.0000 [IU] | Freq: Three times a day (TID) | INTRAMUSCULAR | Status: DC
  Filled 2012-05-08 (×15): qty 1

## 2012-05-08 MED ORDER — ONDANSETRON HCL 4 MG PO TABS: 4.0000 mg | ORAL_TABLET | Freq: Four times a day (QID) | ORAL | Status: DC | PRN

## 2012-05-08 MED ORDER — DEXTROSE-NACL 5-0.45 % IV SOLN
INTRAVENOUS | Status: DC
  Administered 2012-05-08: 19:00:00 via INTRAVENOUS

## 2012-05-08 NOTE — ED Notes (Signed)
IV team at bedside for IV start 

## 2012-05-08 NOTE — ED Notes (Signed)
EMS reports being called to hotel where pt was staying due to marital problems.  Pt usually takes 30 units of Lantus but took "3x the dose" along with "3-4 actos tablets."  Pt did not receive dialysis today.

## 2012-05-08 NOTE — ED Notes (Signed)
MD Bridge at bedside for ultrasound guided IV attempt.

## 2012-05-08 NOTE — ED Notes (Signed)
Pt refusing to give urine sample.  Pt given orange juice d/t CBG, pt took two small sips then refused to drink more.  RN made aware

## 2012-05-08 NOTE — H&P (Signed)
PCP:   [name], NP   Chief Complaint:  overdose  HPI: 42 yo female esrd t/r/sat found in hotel today after taking overdose of her lantus and actos. Husband thinks took lantus 30 Units (on 10 u daily) and 3 actos pills.  Pt is very depressed mood, and will speak minimally to me.  She appears comfortable but very down.  She denies any pain.  Barely answers yes/no questions due to affect.    Review of Systems:  Unreliable from pt at this time  Past Medical History: Past Medical History  Diagnosis Date  . Diabetes mellitus without complication   . Renal disorder    Past Surgical History  Procedure Laterality Date  . Cesarean section    . Eye surgery      retinal detachment    Medications: Prior to Admission medications   Medication Sig Start Date End Date Taking? Authorizing Provider  cinacalcet (SENSIPAR) 30 MG tablet Take 60 mg by mouth daily.   Yes Historical Provider, MD  insulin glargine (LANTUS) 100 UNIT/ML injection Inject 30 Units into the skin daily.   Yes Historical Provider, MD  pioglitazone (ACTOS) 15 MG tablet Take 15 mg by mouth daily.   Yes Historical Provider, MD  sevelamer (RENVELA) 800 MG tablet Take 2,400 mg by mouth 3 (three) times daily with meals.   Yes Historical Provider, MD    Allergies:   Allergies  Allergen Reactions  . Iohexol      Code: HIVES, Desc: ITCHING, HIVES, SWELLING- 13 HR PRE-MEDS REQUIRED- ASM, Onset Date: [id number]   . Latex     unknown    Social History:  reports that she has never smoked. She has never used smokeless tobacco. She reports that she does not drink alcohol. Her drug history is not on file.  Family History: History reviewed. No pertinent family history.  Physical Exam: Filed Vitals:   05/08/12 1729 05/08/12 1830 05/08/12 1831 05/08/12 2005  BP: 154/70 141/71 141/70 157/86  Pulse: 84 85 84 85  Temp:      TempSrc:      Resp: 11 11 14 21   SpO2: 98% 98% 97% 100%   General appearance: alert and no  distress Head: Normocephalic, without obvious abnormality, atraumatic Eyes: negative Nose: Nares normal. Septum midline. Mucosa normal. No drainage or sinus tenderness. Neck: no JVD and supple, symmetrical, trachea midline Lungs: clear to auscultation bilaterally Heart: regular rate and rhythm, S1, S2 normal, no murmur, click, rub or gallop Abdomen: soft, non-tender; bowel sounds normal; no masses,  no organomegaly Extremities: extremities normal, atraumatic, no cyanosis or edema Pulses: 2+ and symmetric Skin: Skin color, texture, turgor normal. No rashes or lesions Neurologic: Grossly normal Psych:  Depressed and flat    Labs on Admission:   Recent Labs  05/08/12 1706  NA 137  K 4.0  CL 94*  CO2 22  GLUCOSE 69*  BUN 56*  CREATININE 9.17*  CALCIUM 8.8    Recent Labs  05/08/12 1706  AST 22  ALT 21  ALKPHOS 95  BILITOT 0.3  PROT 8.0  ALBUMIN 3.5    Recent Labs  05/08/12 1706  LIPASE 37    Recent Labs  05/08/12 1706  WBC 7.5  NEUTROABS 3.3  HGB 14.4  HCT 41.8  MCV 88.0  PLT 312   Radiological Exams on Admission: Dg Chest Port 1 View  05/08/2012  *RADIOLOGY REPORT*  Clinical Data: Diabetes, renal disorder, drug overdose  CHEST - 1 VIEW  Comparison:  08/11/2010  Findings: The heart size and mediastinal contours are within normal limits.  Both lungs are clear.  IMPRESSION: No active disease.   Original Report Authenticated By: Jerilynn Mages. Annamaria Boots, M.D.     Assessment/Plan  42 yo fejmale with intentional suicide attempt overdose of lantus/actos esrd patient Principal Problem:   Insulin overdose Active Problems:   ESRD (end stage renal disease) on dialysis   Suicide attempt   Hypoglycemia  d10 gtt.  Ccm consulted and did not feel patient needed monitoring in ICU and felt hourly glucose checks were sufficient.  Hold all diabetic meds.  Nephrology also notified and will see in am.  IVC paperwork reported to be done by EDP.  Full code.  Suicide  precautions.  DAVID,RACHAL A 05/08/2012, 8:30 PM

## 2012-05-08 NOTE — ED Provider Notes (Signed)
History     CSN: [id number]  Arrival date & time 05/08/12  1635   First MD Initiated Contact with Patient 05/08/12 1700      Chief Complaint  Patient presents with  . Drug Overdose    (Consider location/radiation/quality/duration/timing/severity/associated sxs/prior treatment) The history is provided by the EMS personnel, a relative and the patient. The history is limited by the condition of the patient.   42 year old female. Brought in by EMS. Patient was in a hotel room due to having marital problems. EMS was called to the hotel room. Patient is a known diabetic. She reported to EMS that she took 3 times her normal dose of Lantus. Her normal dose of Lantus is 30 units. Along with that she took 3-4 Actos tablets. Patient is also a dialysis patient she did not receive dialysis today. But she did receive dialysis 2 days ago. Patient in the ED acting somnolent not answering any questions. This is a presumed suicide attempt.  Later of patient's husband arrived he was not present in the hotel room however he did help clarify that she is followed by [facility].  For EMS the blood sugar was low initially some confusion on that we think it was in the 50s she was given oral glucose. IV line was not established by them.  Past Medical History  Diagnosis Date  . Diabetes mellitus without complication   . Renal disorder     Past Surgical History  Procedure Laterality Date  . Cesarean section    . Eye surgery      retinal detachment    History reviewed. No pertinent family history.  History  Substance Use Topics  . Smoking status: Never Smoker   . Smokeless tobacco: Never Used  . Alcohol Use: No    OB History   Grav Para Term Preterm Abortions TAB SAB Ect Mult Living                  Review of Systems  Unable to perform ROS Psychiatric/Behavioral: Positive for suicidal ideas.   level V caveat applies the review of systems. Patient not cooperating to give  information.  Allergies  Iohexol and Latex  Home Medications   Current Outpatient Rx  Name  Route  Sig  Dispense  Refill  . cinacalcet (SENSIPAR) 30 MG tablet   Oral   Take 60 mg by mouth daily.         . insulin glargine (LANTUS) 100 UNIT/ML injection   Subcutaneous   Inject 30 Units into the skin daily.         . pioglitazone (ACTOS) 15 MG tablet   Oral   Take 15 mg by mouth daily.         . sevelamer (RENVELA) 800 MG tablet   Oral   Take 2,400 mg by mouth 3 (three) times daily with meals.           BP 141/70  Pulse 84  Temp(Src) 98 F (36.7 C) (Oral)  Resp 14  SpO2 97%  Physical Exam  Nursing note and vitals reviewed. Constitutional: She appears well-developed and well-nourished.  HENT:  Head: Normocephalic and atraumatic.  Mouth/Throat: Oropharynx is clear and moist.  Eyes: Conjunctivae are normal. Pupils are equal, round, and reactive to light.  Cardiovascular: Normal rate, regular rhythm and normal heart sounds.   No murmur heard. Pulmonary/Chest: Effort normal and breath sounds normal. No respiratory distress.  Abdominal: Soft. Bowel sounds are normal. There is no tenderness.  Musculoskeletal:  Normal range of motion. She exhibits no edema.  Lymphadenopathy:    She has no cervical adenopathy.  Neurological:  Patient not cooperative neurological exam. She is a weight somewhat somnolent appears depressed moving all 4 extremities.  Skin: Skin is warm. No rash noted.    ED Course  Procedures (including critical care time)  Labs Reviewed  COMPREHENSIVE METABOLIC PANEL - Abnormal; Notable for the following:    Chloride 94 (*)    Glucose, Bld 69 (*)    BUN 56 (*)    Creatinine, Ser 9.17 (*)    GFR calc non Af Amer 5 (*)    GFR calc Af Amer 5 (*)    All other components within normal limits  SALICYLATE LEVEL - Abnormal; Notable for the following:    Salicylate Lvl 123456 (*)    All other components within normal limits  GLUCOSE, CAPILLARY -  Abnormal; Notable for the following:    Glucose-Capillary 56 (*)    All other components within normal limits  GLUCOSE, CAPILLARY  ETHANOL  LIPASE, BLOOD  CBC WITH DIFFERENTIAL  ACETAMINOPHEN LEVEL  GLUCOSE, CAPILLARY  GLUCOSE, CAPILLARY  URINE RAPID DRUG SCREEN (HOSP PERFORMED)  URINALYSIS, ROUTINE W REFLEX MICROSCOPIC  PREGNANCY, URINE   Dg Chest Port 1 View  05/08/2012  *RADIOLOGY REPORT*  Clinical Data: Diabetes, renal disorder, drug overdose  CHEST - 1 VIEW  Comparison:  08/11/2010  Findings: The heart size and mediastinal contours are within normal limits.  Both lungs are clear.  IMPRESSION: No active disease.   Original Report Authenticated By: Jerilynn Mages. Annamaria Boots, M.D.      Date: 05/08/2012  Rate: 87  Rhythm: normal sinus rhythm  QRS Axis: normal  Intervals: QT prolonged  ST/T Wave abnormalities: normal  Conduction Disutrbances:none  Narrative Interpretation:   Old EKG Reviewed: unchanged No change in EKG compared to 08/11/2010. Patient had prolonged QT then as well.  Results for orders placed during the hospital encounter of 05/08/12  GLUCOSE, CAPILLARY      Result Value Range   Glucose-Capillary 78  70 - 99 mg/dL   Comment 1 Documented in Chart     Comment 2 Notify RN    ETHANOL      Result Value Range   Alcohol, Ethyl (B) <11  0 - 11 mg/dL  COMPREHENSIVE METABOLIC PANEL      Result Value Range   Sodium 137  135 - 145 mEq/L   Potassium 4.0  3.5 - 5.1 mEq/L   Chloride 94 (*) 96 - 112 mEq/L   CO2 22  19 - 32 mEq/L   Glucose, Bld 69 (*) 70 - 99 mg/dL   BUN 56 (*) 6 - 23 mg/dL   Creatinine, Ser 9.17 (*) 0.50 - 1.10 mg/dL   Calcium 8.8  8.4 - 10.5 mg/dL   Total Protein 8.0  6.0 - 8.3 g/dL   Albumin 3.5  3.5 - 5.2 g/dL   AST 22  0 - 37 U/L   ALT 21  0 - 35 U/L   Alkaline Phosphatase 95  39 - 117 U/L   Total Bilirubin 0.3  0.3 - 1.2 mg/dL   GFR calc non Af Amer 5 (*) >90 mL/min   GFR calc Af Amer 5 (*) >90 mL/min  LIPASE, BLOOD      Result Value Range   Lipase 37  11  - 59 U/L  CBC WITH DIFFERENTIAL      Result Value Range   WBC 7.5  4.0 - 10.5 K/uL  RBC 4.75  3.87 - 5.11 MIL/uL   Hemoglobin 14.4  12.0 - 15.0 g/dL   HCT 41.8  36.0 - 46.0 %   MCV 88.0  78.0 - 100.0 fL   MCH 30.3  26.0 - 34.0 pg   MCHC 34.4  30.0 - 36.0 g/dL   RDW 12.8  11.5 - 15.5 %   Platelets 312  150 - 400 K/uL   Neutrophils Relative 44  43 - 77 %   Neutro Abs 3.3  1.7 - 7.7 K/uL   Lymphocytes Relative 44  12 - 46 %   Lymphs Abs 3.3  0.7 - 4.0 K/uL   Monocytes Relative 11  3 - 12 %   Monocytes Absolute 0.8  0.1 - 1.0 K/uL   Eosinophils Relative 2  0 - 5 %   Eosinophils Absolute 0.1  0.0 - 0.7 K/uL   Basophils Relative 0  0 - 1 %   Basophils Absolute 0.0  0.0 - 0.1 K/uL  ACETAMINOPHEN LEVEL      Result Value Range   Acetaminophen (Tylenol), Serum <15.0  10 - 30 ug/mL  SALICYLATE LEVEL      Result Value Range   Salicylate Lvl 123456 (*) 2.8 - 20.0 mg/dL  GLUCOSE, CAPILLARY      Result Value Range   Glucose-Capillary 56 (*) 70 - 99 mg/dL   Comment 1 Documented in Chart     Comment 2 Notify RN    GLUCOSE, CAPILLARY      Result Value Range   Glucose-Capillary 78  70 - 99 mg/dL   Comment 1 Notify RN     Comment 2 Documented in Chart    GLUCOSE, CAPILLARY      Result Value Range   Glucose-Capillary 74  70 - 99 mg/dL     1. Overdose, initial encounter   2. Hypoglycemia   3. Renal failure     CRITICAL CARE Performed by: Mervin Kung.   Total critical care time: 30 min Critical care time was exclusive of separately billable procedures and treating other patients.  Critical care was necessary to treat or prevent imminent or life-threatening deterioration.  Critical care was time spent personally by me on the following activities: development of treatment plan with patient and/or surrogate as well as nursing, discussions with consultants, evaluation of patient's response to treatment, examination of patient, obtaining history from patient or surrogate, ordering  and performing treatments and interventions, ordering and review of laboratory studies, ordering and review of radiographic studies, pulse oximetry and re-evaluation of patient's condition.   MDM  Patient will be admitted to step down unit by hospitalist team. Patient with attempted suicide by overdosing  on long-acting insulin and oral hypoglycemics Actos. Patient not cooperative in providing the amounts or times. However EMS got that she took 30 units of Lantus three-time her normal dose and took 3-4 Actos tablets.  The patient is also a dialysis patient. The thousands was on Tuesday she was due to be dialyzed today without dialyzed. Her potassium is okay [facility] has been consulted. Patient's treatment plan was initially D5 half-normal saline sugar for fine here EMS did have to give her oral of sugar for a low blood sugar it at the scene. However we had to getting IV access. Once we get IV access using the ultrasound patient blood sugar dropped down to 56. So the D5 switched over to D10 patient also given an amp of D50. Patient remained alert the whole thing. However not.  Cooperative. Involuntary commitment papers have been filed on her. Temporary admit orders completed.  Overdose workup negative for any evidence of a significant Tylenol overdose.       Mervin Kung, MD 05/14/12 [phone]
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
## 2012-05-08 NOTE — ED Notes (Signed)
IV team paged for IV attempt.

## 2012-05-08 NOTE — ED Notes (Signed)
Pt CBG read at 214 mg/dL

## 2012-05-08 NOTE — ED Notes (Signed)
Pt alert, lying in bed talking with husband at bedside.

## 2012-05-08 NOTE — Consult Note (Signed)
PULMONARY  / CRITICAL CARE MEDICINE  Name: CURLEY GRGAS MRN: [id number] DOB: [birth year]    ADMISSION DATE:  05/08/2012 CONSULTATION DATE:  05/08/12  REFERRING MD :  Linna Hoff  CHIEF COMPLAINT:  Intentional Insulin overdose  BRIEF PATIENT DESCRIPTION: 42 y/o female with ESRD had an intentional insulin overdose with 60 units of glargine insulin and actos on 3/13 and was admitted by [facility].  PCCM consulted for consideration of ICU level care.  SIGNIFICANT EVENTS / STUDIES:    LINES / TUBES:   CULTURES:   ANTIBIOTICS:   HISTORY OF PRESENT ILLNESS:  42 y/o female with ESRD had an intentional insulin overdose with 60 units of glargine insulin on 3/13 and was admitted by [facility]  PCCM consulted for consideration of ICU level care. She is quit reticent in the ED, but states that she took "two syringes full of glargine" intentionally on 3/13.  She states that she doesn't really recall why she did it but it is presumed that it was an intentional overdose.  She denies taking anything else.  She does not answer me when I ask about further suicidal ideation or even other general medical questions.  She was receiving D5 and PCCM consulted for consideration of ICU placement for frequent glucose checks.  PAST MEDICAL HISTORY :  Past Medical History  Diagnosis Date  . Diabetes mellitus without complication   . Renal disorder    Past Surgical History  Procedure Laterality Date  . Cesarean section    . Eye surgery      retinal detachment   Prior to Admission medications   Medication Sig Start Date End Date Taking? Authorizing Provider  cinacalcet (SENSIPAR) 30 MG tablet Take 60 mg by mouth daily.   Yes Historical Provider, MD  insulin glargine (LANTUS) 100 UNIT/ML injection Inject 30 Units into the skin daily.   Yes Historical Provider, MD  pioglitazone (ACTOS) 15 MG tablet Take 15 mg by mouth daily.   Yes Historical Provider, MD  sevelamer (RENVELA) 800 MG tablet Take 2,400 mg by mouth 3 (three)  times daily with meals.   Yes Historical Provider, MD   Allergies  Allergen Reactions  . Iohexol      Code: HIVES, Desc: ITCHING, HIVES, SWELLING- 13 HR PRE-MEDS REQUIRED- ASM, Onset Date: [id number]   . Latex     unknown    FAMILY HISTORY:  History reviewed. No pertinent family history. SOCIAL HISTORY:  reports that she has never smoked. She has never used smokeless tobacco. She reports that she does not drink alcohol. Her drug history is not on file.  REVIEW OF SYSTEMS:  Patient refuses to answer  SUBJECTIVE:   VITAL SIGNS: Temp:  [98 F (36.7 C)] 98 F (36.7 C) (03/13 2133) Pulse Rate:  [80-87] 87 (03/13 2133) Resp:  [10-21] 16 (03/13 2133) BP: (141-163)/(70-86) 155/84 mmHg (03/13 2133) SpO2:  [97 %-100 %] 100 % (03/13 2133) Weight:  [71 kg (156 lb 8.4 oz)] 71 kg (156 lb 8.4 oz) (03/13 2133) HEMODYNAMICS:   VENTILATOR SETTINGS:   INTAKE / OUTPUT: Intake/Output   None     PHYSICAL EXAMINATION:  Gen: forlorn but in no acute distress HEENT: NCAT, PERRL, EOMi, OP clear, neck supple without masses PULM: CTA B CV: RRR, no mgr, no JVD AB: BS+, soft, nontender, no hsm Ext: warm, no edema, no clubbing, no cyanosis Derm: no rash or skin breakdown Neuro: awake and alert, follows commands and moves all four extremities, fluent speech but reticent   LABS:  Recent Labs Lab 05/08/12 1706  HGB 14.4  WBC 7.5  PLT 312  NA 137  K 4.0  CL 94*  CO2 22  GLUCOSE 69*  BUN 56*  CREATININE 9.17*  CALCIUM 8.8  AST 22  ALT 21  ALKPHOS 95  BILITOT 0.3  PROT 8.0  ALBUMIN 3.5    Recent Labs Lab 05/08/12 1836 05/08/12 1916 05/08/12 1956 05/08/12 2033 05/08/12 2100  GLUCAP 56* 78 74 214* 192*    3/13 CXR: normal cardiac silhouette, lungs clear  Impression: 1) Intentional suicide attempt with insulin overdose 2) Hypoglycemia from suicide attempt 3) ESRD  ASSESSMENT / PLAN:  This 42 y/o female had an intentional overdose with insulin and actos on 3/13.  She  is currently intact from a neurologic standpoint and her glucose is well controlled on a D10 drip.  In fact she is now becoming hyperglycemic.  No indication for ICU level care  1) admit to SDU 2) D10 titrated to maintain blood glucose between 70 and 140 ideally 3) Encourage po intake 4) q1h to q2h accuchecks  5) Suicide precautions  PCCM will sign off, call if questions. [facility] available for assistance while in [place], DOUGLAS,MD Pulmonary and [place] Pager: [phone]  05/08/2012, 10:10 PM

## 2012-05-08 NOTE — ED Notes (Addendum)
Husband reported pt would drink apple juice instead of the orange juice.  Apple juice given to pt, pt refused to drink any.  MD made aware.

## 2012-05-08 NOTE — ED Notes (Signed)
IV team paged x2

## 2012-05-08 NOTE — ED Notes (Signed)
IV team unable to obtain IV access.  MD Zackonski made aware.

## 2012-05-08 NOTE — ED Notes (Signed)
Suicide precautions remain in place.  Sitter at bedside.

## 2012-05-09 ENCOUNTER — Inpatient Hospital Stay (HOSPITAL_COMMUNITY): Payer: BC Managed Care – PPO

## 2012-05-09 DIAGNOSIS — F3289 Other specified depressive episodes: Secondary | ICD-10-CM

## 2012-05-09 DIAGNOSIS — T7421XA Adult sexual abuse, confirmed, initial encounter: Secondary | ICD-10-CM

## 2012-05-09 DIAGNOSIS — N058 Unspecified nephritic syndrome with other morphologic changes: Secondary | ICD-10-CM

## 2012-05-09 DIAGNOSIS — F339 Major depressive disorder, recurrent, unspecified: Secondary | ICD-10-CM | POA: Diagnosis present

## 2012-05-09 DIAGNOSIS — F329 Major depressive disorder, single episode, unspecified: Secondary | ICD-10-CM

## 2012-05-09 DIAGNOSIS — E1029 Type 1 diabetes mellitus with other diabetic kidney complication: Secondary | ICD-10-CM

## 2012-05-09 LAB — URINALYSIS, ROUTINE W REFLEX MICROSCOPIC
Bilirubin Urine: NEGATIVE
Glucose, UA: 100 mg/dL — AB
Ketones, ur: NEGATIVE mg/dL
Leukocytes, UA: NEGATIVE
Nitrite: NEGATIVE
Protein, ur: 100 mg/dL — AB
Specific Gravity, Urine: 1.01 (ref 1.005–1.030)
Urobilinogen, UA: 0.2 mg/dL (ref 0.0–1.0)
pH: 6.5 (ref 5.0–8.0)

## 2012-05-09 LAB — GLUCOSE, CAPILLARY
Glucose-Capillary: 103 mg/dL — ABNORMAL HIGH (ref 70–99)
Glucose-Capillary: 112 mg/dL — ABNORMAL HIGH (ref 70–99)
Glucose-Capillary: 133 mg/dL — ABNORMAL HIGH (ref 70–99)
Glucose-Capillary: 133 mg/dL — ABNORMAL HIGH (ref 70–99)
Glucose-Capillary: 146 mg/dL — ABNORMAL HIGH (ref 70–99)
Glucose-Capillary: 149 mg/dL — ABNORMAL HIGH (ref 70–99)
Glucose-Capillary: 153 mg/dL — ABNORMAL HIGH (ref 70–99)
Glucose-Capillary: 163 mg/dL — ABNORMAL HIGH (ref 70–99)
Glucose-Capillary: 173 mg/dL — ABNORMAL HIGH (ref 70–99)
Glucose-Capillary: 179 mg/dL — ABNORMAL HIGH (ref 70–99)
Glucose-Capillary: 59 mg/dL — ABNORMAL LOW (ref 70–99)
Glucose-Capillary: 68 mg/dL — ABNORMAL LOW (ref 70–99)

## 2012-05-09 LAB — RENAL FUNCTION PANEL
BUN: 75 mg/dL — ABNORMAL HIGH (ref 6–23)
CO2: 23 mEq/L (ref 19–32)
Chloride: 89 mEq/L — ABNORMAL LOW (ref 96–112)
Glucose, Bld: 241 mg/dL — ABNORMAL HIGH (ref 70–99)
Potassium: 4.3 mEq/L (ref 3.5–5.1)

## 2012-05-09 LAB — RAPID URINE DRUG SCREEN, HOSP PERFORMED
Amphetamines: NOT DETECTED
Barbiturates: NOT DETECTED
Benzodiazepines: NOT DETECTED
Cocaine: NOT DETECTED
Opiates: NOT DETECTED
Tetrahydrocannabinol: NOT DETECTED

## 2012-05-09 LAB — CBC
HCT: 34.3 % — ABNORMAL LOW (ref 36.0–46.0)
Hemoglobin: 11.9 g/dL — ABNORMAL LOW (ref 12.0–15.0)
RBC: 3.98 MIL/uL (ref 3.87–5.11)
WBC: 6.9 10*3/uL (ref 4.0–10.5)

## 2012-05-09 LAB — PREGNANCY, URINE: Preg Test, Ur: NEGATIVE

## 2012-05-09 LAB — URINE MICROSCOPIC-ADD ON

## 2012-05-09 MED ORDER — SODIUM CHLORIDE 0.9 % IV SOLN
62.5000 mg | INTRAVENOUS | Status: AC
  Administered 2012-05-09: 62.5 mg via INTRAVENOUS
  Filled 2012-05-09: qty 5

## 2012-05-09 MED ORDER — DOXERCALCIFEROL 4 MCG/2ML IV SOLN
6.0000 ug | INTRAVENOUS | Status: DC
  Filled 2012-05-09: qty 4

## 2012-05-09 MED ORDER — SODIUM CHLORIDE 0.9 % IV SOLN: 62.5000 mg | INTRAVENOUS | Status: DC

## 2012-05-09 MED ORDER — DOXERCALCIFEROL 4 MCG/2ML IV SOLN
6.0000 ug | INTRAVENOUS | Status: AC
  Administered 2012-05-09: 6 ug via INTRAVENOUS
  Filled 2012-05-09: qty 4

## 2012-05-09 NOTE — Progress Notes (Signed)
Hypoglycemic Event  CBG: 59  Treatment: 4oz apple juice  Symptoms: asymptomatic  Follow-up CBG: Time: CBG Result:  Possible Reasons for Event: recent insulin OD. Reason for hospitalization  Comments/MD notified:    Phillis Knack  Remember to initiate Hypoglycemia Order Set & complete

## 2012-05-09 NOTE — Care Management Note (Signed)
    Page 1 of 1   05/09/2012     8:12:05 AM   CARE MANAGEMENT NOTE 05/09/2012  Patient:  Morgan [name], Morgan [name]   Account Number:  [id number]  Date Initiated:  05/09/2012  Documentation initiated by:  Elissa Hefty  Subjective/Objective Assessment:   adm w ins overdose     Action/Plan:   lives w husband, pcp dr Shanon Brow mabe   Anticipated DC Date:     Anticipated DC Plan:    In-house referral  Clinical Social Worker      DC Planning Services  CM consult      Choice offered to / List presented to:             Status of service:   Medicare Important Message given?   (If response is "NO", the following Medicare IM given date fields will be blank) Date Medicare IM given:   Date Additional Medicare IM given:    Discharge Disposition:    Per UR Regulation:  Reviewed for med. necessity/level of care/duration of stay  If discussed at [place] of Stay Meetings, dates discussed:    Comments:  3/14 0810 debbie dowell rn,bsn
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
## 2012-05-09 NOTE — Progress Notes (Signed)
Pt admitted to unit 6700 from unit 2600. Received report from [place], [place]. Pt is alert and oriented to staff, call bell, and room. Room swept for suicide precautions.  Sitter and husband at patient's bedside. Bed in lowest position. Call bell within reach. Full assessment to Epic. Will continue to monitor. Henry Russel, RN

## 2012-05-09 NOTE — Consult Note (Signed)
Morgan [name] 05/09/2012 SCHERTZ,ROBERT D Requesting Physician:  Dr Wynelle Cleveland  Reason for Consult:  ESRD pt with overdose, need for dialysis HPI: The patient is a 42 y.o. year-old with ESRD due to DM who was brought to ED after apparently intentional overdose with insulin and Actos because of marital problems. She is alert today on IV d10W at 125cc/hr.  No complaints, missed outpt HD yesterday. Gets HD at [facility] on TTS schedule.  ROS  no ha, visual change  no sob or cp   no n/v/d  no abd pain   no jt pain or swelling  no focal weakness or confusion   Past Medical History:  Past Medical History  Diagnosis Date  . Diabetes mellitus without complication   . Renal disorder     Past Surgical History:  Past Surgical History  Procedure Laterality Date  . Cesarean section    . Eye surgery      retinal detachment    Family History: History reviewed. No pertinent family history. Social History:  reports that she has never smoked. She has never used smokeless tobacco. She reports that she does not drink alcohol. Her drug history is not on file.  Allergies:  Allergies  Allergen Reactions  . Iohexol      Code: HIVES, Desc: ITCHING, HIVES, SWELLING- 13 HR PRE-MEDS REQUIRED- ASM, Onset Date: [id number]   . Latex     unknown    Home medications: Prior to Admission medications   Medication Sig Start Date End Date Taking? Authorizing Provider  cinacalcet (SENSIPAR) 30 MG tablet Take 60 mg by mouth daily.   Yes Historical Provider, MD  insulin glargine (LANTUS) 100 UNIT/ML injection Inject 30 Units into the skin daily.   Yes Historical Provider, MD  pioglitazone (ACTOS) 15 MG tablet Take 15 mg by mouth daily.   Yes Historical Provider, MD  sevelamer (RENVELA) 800 MG tablet Take 2,400 mg by mouth 3 (three) times daily with meals.   Yes Historical Provider, MD    Labs: Basic Metabolic Panel:  Recent Labs Lab 05/08/12 1706  NA 137  K 4.0  CL 94*  CO2 22  GLUCOSE 69*  BUN 56*   CREATININE 9.17*  CALCIUM 8.8   Liver Function Tests:  Recent Labs Lab 05/08/12 1706  AST 22  ALT 21  ALKPHOS 95  BILITOT 0.3  PROT 8.0  ALBUMIN 3.5    Recent Labs Lab 05/08/12 1706  LIPASE 37   No results found for this basename: AMMONIA,  in the last 168 hours CBC:  Recent Labs Lab 05/08/12 1706  WBC 7.5  NEUTROABS 3.3  HGB 14.4  HCT 41.8  MCV 88.0  PLT 312   PT/INR: @LABRCNTIP (inr:5) Cardiac Enzymes: )No results found for this basename: CKTOTAL, CKMB, CKMBINDEX, TROPONINI,  in the last 168 hours CBG:  Recent Labs Lab 05/09/12 0412 05/09/12 0512 05/09/12 0613 05/09/12 0710 05/09/12 0758  GLUCAP 103* 93 73 59* 68*    Physical Exam:  Blood pressure 141/85, pulse 86, temperature 97.7 F (36.5 C), temperature source Oral, resp. rate 18, height 5\' 2"  (1.575 m), weight 71 kg (156 lb 8.4 oz), SpO2 100.00%.  Gen: alert HEENT:  EOMI, sclera anicteric, throat clear Neck: no JVD, no LAN Chest: clear bilaterally CV: regular, no rub or gallop, , no carotid or femoral bruits, pedal pulses  Abdomen: soft, nontender, no mass or HSM Ext: no LE edema , no joint effusion or deformity, no gangrene or ulceration Neuro: alert, Ox3, no focal  deficit Access: LUA access is patent  [facility] 4hr EDW 70kg 2K/2.0Ca 400/A1.5 Heparin 7000u EPO none Hectorol 6ug/hd  Venofer 50/wk   Impression/Plan 1. Medication overdose w insuline and Actos- per primary 2. ESRD, missed hd yest, plan HD today 3 hr and usual HD tomorrow 3. HTN/volume- not on any BP medications at home. 1-2 kg up, UF same as tolerated 4. Anemia of CKD- no EPO, Hb 14, weekly IV iron 5. Secondary HPTH- sensipar 60/d, sevelamer 2400ac , vit D as above. Ca ok, check phos w HD 6. DM- takes insulin and oral agents   Kelly Splinter  MD [facility] [phone] pgr    [phone] cell 05/09/2012, 10:55 AM

## 2012-05-10 DIAGNOSIS — Z5189 Encounter for other specified aftercare: Secondary | ICD-10-CM

## 2012-05-10 LAB — GLUCOSE, CAPILLARY
Glucose-Capillary: 158 mg/dL — ABNORMAL HIGH (ref 70–99)
Glucose-Capillary: 156 mg/dL — ABNORMAL HIGH (ref 70–99)

## 2012-05-10 MED ORDER — INSULIN ASPART 100 UNIT/ML ~~LOC~~ SOLN
0.0000 [IU] | Freq: Three times a day (TID) | SUBCUTANEOUS | Status: DC
  Administered 2012-05-10: 2 [IU] via SUBCUTANEOUS
  Administered 2012-05-11: 3 [IU] via SUBCUTANEOUS

## 2012-05-10 MED ORDER — SEVELAMER CARBONATE 800 MG PO TABS
2400.0000 mg | ORAL_TABLET | Freq: Three times a day (TID) | ORAL | Status: DC
  Administered 2012-05-10 – 2012-05-12 (×5): 2400 mg via ORAL
  Filled 2012-05-10 (×10): qty 3

## 2012-05-10 NOTE — Progress Notes (Signed)
Subjective: no cos, husband and sitter in room Objective Vital signs in last 24 hours: Filed Vitals:   05/10/12 0130 05/10/12 0154 05/10/12 0245 05/10/12 1000  BP: 102/64 117/61 116/64 118/70  Pulse: 90 91 93 91  Temp:  98.4 F (36.9 C) 97.4 F (36.3 C) 98.5 F (36.9 C)  TempSrc:  Oral Oral Oral  Resp: 16 14 16 16   Height:      Weight:  71.2 kg (156 lb 15.5 oz)    SpO2:  97% 98% 98%   Weight change: 2.05 kg (4 lb 8.3 oz)  Intake/Output Summary (Last 24 hours) at 05/10/12 1220 Last data filed at 05/10/12 0900  Gross per 24 hour  Intake    620 ml  Output   1550 ml  Net   -930 ml   Labs: Basic Metabolic Panel:  Recent Labs Lab 05/08/12 1706 05/09/12 2302  NA 137 129*  K 4.0 4.3  CL 94* 89*  CO2 22 23  GLUCOSE 69* 241*  BUN 56* 75*  CREATININE 9.17* 10.48*  CALCIUM 8.8 8.3*  PHOS  --  6.4*   Liver Function Tests:  Recent Labs Lab 05/08/12 1706 05/09/12 2302  AST 22  --   ALT 21  --   ALKPHOS 95  --   BILITOT 0.3  --   PROT 8.0  --   ALBUMIN 3.5 3.1*    Recent Labs Lab 05/08/12 1706  LIPASE 37   No results found for this basename: AMMONIA,  in the last 168 hours CBC:  Recent Labs Lab 05/08/12 1706 05/09/12 2302  WBC 7.5 6.9  NEUTROABS 3.3  --   HGB 14.4 11.9*  HCT 41.8 34.3*  MCV 88.0 86.2  PLT 312 342   Cardiac Enzymes: No results found for this basename: CKTOTAL, CKMB, CKMBINDEX, TROPONINI,  in the last 168 hours CBG:  Recent Labs Lab 05/09/12 1731 05/09/12 1957 05/10/12 0239 05/10/12 0732 05/10/12 1151  GLUCAP 162* 163* 158* 156* 183*    Iron Studies: No results found for this basename: IRON, TIBC, TRANSFERRIN, FERRITIN,  in the last 72 hours Studies/Results: Mr Brain Wo Contrast  05/09/2012  *RADIOLOGY REPORT*  Clinical Data: Acute personality change.  Rule out mass.  Diabetes and renal failure  MRI HEAD WITHOUT CONTRAST  Technique:  Multiplanar, multiecho pulse sequences of the brain and surrounding structures were obtained  according to standard protocol without intravenous contrast.  Comparison: None  Findings: Generalized atrophy.  No significant acute or chronic ischemia is identified.  Negative for mass or edema.  No midline shift.  Ventricle size is normal.  Negative for hemorrhage.  Paranasal sinuses show mild mucosal edema.  IMPRESSION: No acute abnormality.  Mild atrophy without significant ischemic change.   Original Report Authenticated By: Carl Best, M.D.    Dg Chest Port 1 View  05/08/2012  *RADIOLOGY REPORT*  Clinical Data: Diabetes, renal disorder, drug overdose  CHEST - 1 VIEW  Comparison:  08/11/2010  Findings: The heart size and mediastinal contours are within normal limits.  Both lungs are clear.  IMPRESSION: No active disease.   Original Report Authenticated By: Jerilynn Mages. Annamaria Boots, M.D.    Medications:   . doxercalciferol  6 mcg Intravenous Q Fri-HD  . doxercalciferol  6 mcg Intravenous Q T,Th,Sa-HD  . ferric gluconate (FERRLECIT/NULECIT) IV  62.5 mg Intravenous Q Fri-HD  . [START ON 05/15/2012] ferric gluconate (FERRLECIT/NULECIT) IV  62.5 mg Intravenous Q Thu-HD  . heparin  5,000 Units Subcutaneous Q8H  . insulin aspart  0-9 Units Subcutaneous TID WC   I  have reviewed scheduled and prn medications.  Physical Exam: General: alert, flat affect, NAD Heart: RRR Lungs: CTA Bilaterally Abdomen:  bs pos. Soft ,nontender Extremities: Dialysis Access:  No pedal ,edema. Positive bruit  Lu A AVF  [facility] 4hr EDW 70kg 2K/2.0Ca 400/A1.5 Heparin 7000u EPO none Hectorol 6ug/hd Venofer 50/wk   Impression/Plan  1. Medication overdose w insuline and Actos- per primary 2. Depression= Psy seeing 3. ESRD,=([facility]) missed hd Thursday, HD tolarated3 hr last pm  and usual HD today latter afternoon 4. HTN/volume-  BP=118/70 this am/ not on any BP medications at home., UF same as tolerated 5. Anemia of CKD- no EPO, Hb 14> 11.9/ fu hgb  weekly IV iron 6. Secondary HPTH- sensipar 60/d, start  sevelamer 2400ac  With phos=6.4, vit D on hd/. Ca ok, /  7. DM- takes insulin and oral agents   Ernest Haber, PA-C [facility] [phone] 05/10/2012,12:20 PM  LOS: 2 days   Patient seen and examined.  Agree with assessment and plan as above. Kelly Splinter  MD [phone] pgr    [phone] cell 05/10/2012, 2:57 PM
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
## 2012-05-10 NOTE — Consult Note (Signed)
Reason for Consult:anger issues Referring Physician: unknown  Morgan [name] is an 42 y.o. female.  HPI:  42 yo female esrd t/r/sat found in hotel today after taking overdose of her lantus and actos. Husband thinks took lantus 30 Units (on 10 u daily) and 3 actos pills.   Seen. Reports anger issues and conflict with husband. Per pt she is going to talk and husband now and both willing to go for therapy now. Per pt she is not depressed. Denies mania or psychosis. Per her OD happened when she angry with her husband. Per pt it was impulsive act as she wanted hurt herself at that time. Denies any SI thoughts after that.    Past Medical History  Diagnosis Date  . Diabetes mellitus without complication   . Renal disorder     Past Surgical History  Procedure Laterality Date  . Cesarean section    . Eye surgery      retinal detachment    History reviewed. No pertinent family history.  Social History:  reports that she has never smoked. She has never used smokeless tobacco. She reports that she does not drink alcohol. Her drug history is not on file.  Allergies:  Allergies  Allergen Reactions  . Iohexol      Code: HIVES, Desc: ITCHING, HIVES, SWELLING- 13 HR PRE-MEDS REQUIRED- ASM, Onset Date: [id number]   . Latex     unknown    Medications: I have reviewed the patient's current medications.  Results for orders placed during the hospital encounter of 05/08/12 (from the past 48 hour(s))  MRSA PCR SCREENING     Status: None   Collection Time    05/08/12  9:36 PM      Result Value Range   MRSA by PCR NEGATIVE  NEGATIVE   Comment:            The GeneXpert MRSA Assay (FDA     approved for NASAL specimens     only), is one component of a     comprehensive MRSA colonization     surveillance program. It is not     intended to diagnose MRSA     infection nor to guide or     monitor treatment for     MRSA infections.  GLUCOSE, CAPILLARY     Status: Abnormal   Collection Time   05/08/12  9:38 PM      Result Value Range   Glucose-Capillary 174 (*) 70 - 99 mg/dL  GLUCOSE, CAPILLARY     Status: Abnormal   Collection Time    05/08/12 10:09 PM      Result Value Range   Glucose-Capillary 173 (*) 70 - 99 mg/dL  GLUCOSE, CAPILLARY     Status: Abnormal   Collection Time    05/08/12 11:14 PM      Result Value Range   Glucose-Capillary 179 (*) 70 - 99 mg/dL  GLUCOSE, CAPILLARY     Status: Abnormal   Collection Time    05/09/12 12:11 AM      Result Value Range   Glucose-Capillary 163 (*) 70 - 99 mg/dL  GLUCOSE, CAPILLARY     Status: Abnormal   Collection Time    05/09/12  1:12 AM      Result Value Range   Glucose-Capillary 149 (*) 70 - 99 mg/dL  GLUCOSE, CAPILLARY     Status: Abnormal   Collection Time    05/09/12  2:11 AM      Result Value Range  Glucose-Capillary 133 (*) 70 - 99 mg/dL  GLUCOSE, CAPILLARY     Status: Abnormal   Collection Time    05/09/12  3:11 AM      Result Value Range   Glucose-Capillary 106 (*) 70 - 99 mg/dL  GLUCOSE, CAPILLARY     Status: Abnormal   Collection Time    05/09/12  4:12 AM      Result Value Range   Glucose-Capillary 103 (*) 70 - 99 mg/dL  GLUCOSE, CAPILLARY     Status: None   Collection Time    05/09/12  5:12 AM      Result Value Range   Glucose-Capillary 93  70 - 99 mg/dL  URINE RAPID DRUG SCREEN (HOSP PERFORMED)     Status: None   Collection Time    05/09/12  5:32 AM      Result Value Range   Opiates NONE DETECTED  NONE DETECTED   Cocaine NONE DETECTED  NONE DETECTED   Benzodiazepines NONE DETECTED  NONE DETECTED   Amphetamines NONE DETECTED  NONE DETECTED   Tetrahydrocannabinol NONE DETECTED  NONE DETECTED   Barbiturates NONE DETECTED  NONE DETECTED   Comment:            DRUG SCREEN FOR MEDICAL PURPOSES     ONLY.  IF CONFIRMATION IS NEEDED     FOR ANY PURPOSE, NOTIFY LAB     WITHIN 5 DAYS.                LOWEST DETECTABLE LIMITS     FOR URINE DRUG SCREEN     Drug Class       Cutoff (ng/mL)      Amphetamine      1000     Barbiturate      200     Benzodiazepine   [id number]     Tricyclics       [id number]     Opiates          300     Cocaine          300     THC              50  URINALYSIS, ROUTINE W REFLEX MICROSCOPIC     Status: Abnormal   Collection Time    05/09/12  5:32 AM      Result Value Range   Color, Urine YELLOW  YELLOW   APPearance CLEAR  CLEAR   Specific Gravity, Urine 1.010  1.005 - 1.030   pH 6.5  5.0 - 8.0   Glucose, UA 100 (*) NEGATIVE mg/dL   Hgb urine dipstick LARGE (*) NEGATIVE   Bilirubin Urine NEGATIVE  NEGATIVE   Ketones, ur NEGATIVE  NEGATIVE mg/dL   Protein, ur 100 (*) NEGATIVE mg/dL   Urobilinogen, UA 0.2  0.0 - 1.0 mg/dL   Nitrite NEGATIVE  NEGATIVE   Leukocytes, UA NEGATIVE  NEGATIVE  URINE MICROSCOPIC-ADD ON     Status: None   Collection Time    05/09/12  5:32 AM      Result Value Range   Squamous Epithelial / LPF RARE  RARE   WBC, UA 0-2  <3 WBC/hpf   RBC / HPF 21-50  <3 RBC/hpf   Bacteria, UA RARE  RARE  PREGNANCY, URINE     Status: None   Collection Time    05/09/12  5:33 AM      Result Value Range   Preg Test, Ur NEGATIVE  NEGATIVE   Comment:  THE SENSITIVITY OF THIS     METHODOLOGY IS >20 mIU/mL.  GLUCOSE, CAPILLARY     Status: None   Collection Time    05/09/12  6:13 AM      Result Value Range   Glucose-Capillary 73  70 - 99 mg/dL  GLUCOSE, CAPILLARY     Status: Abnormal   Collection Time    05/09/12  7:10 AM      Result Value Range   Glucose-Capillary 59 (*) 70 - 99 mg/dL  GLUCOSE, CAPILLARY     Status: Abnormal   Collection Time    05/09/12  7:58 AM      Result Value Range   Glucose-Capillary 68 (*) 70 - 99 mg/dL  GLUCOSE, CAPILLARY     Status: Abnormal   Collection Time    05/09/12  9:07 AM      Result Value Range   Glucose-Capillary 112 (*) 70 - 99 mg/dL   Comment 1 Notify RN     Comment 2 Documented in Chart    GLUCOSE, CAPILLARY     Status: Abnormal   Collection Time    05/09/12 10:13 AM      Result Value  Range   Glucose-Capillary 133 (*) 70 - 99 mg/dL  GLUCOSE, CAPILLARY     Status: Abnormal   Collection Time    05/09/12 11:03 AM      Result Value Range   Glucose-Capillary 146 (*) 70 - 99 mg/dL   Comment 1 Notify RN     Comment 2 Documented in Chart    GLUCOSE, CAPILLARY     Status: Abnormal   Collection Time    05/09/12 12:05 PM      Result Value Range   Glucose-Capillary 153 (*) 70 - 99 mg/dL  GLUCOSE, CAPILLARY     Status: Abnormal   Collection Time    05/09/12  5:31 PM      Result Value Range   Glucose-Capillary 162 (*) 70 - 99 mg/dL  GLUCOSE, CAPILLARY     Status: Abnormal   Collection Time    05/09/12  7:57 PM      Result Value Range   Glucose-Capillary 163 (*) 70 - 99 mg/dL   Comment 1 Documented in Chart     Comment 2 Notify RN    CBC     Status: Abnormal   Collection Time    05/09/12 11:02 PM      Result Value Range   WBC 6.9  4.0 - 10.5 K/uL   RBC 3.98  3.87 - 5.11 MIL/uL   Hemoglobin 11.9 (*) 12.0 - 15.0 g/dL   Comment: DELTA CHECK NOTED     REPEATED TO VERIFY   HCT 34.3 (*) 36.0 - 46.0 %   MCV 86.2  78.0 - 100.0 fL   MCH 29.9  26.0 - 34.0 pg   MCHC 34.7  30.0 - 36.0 g/dL   RDW 12.6  11.5 - 15.5 %   Platelets 342  150 - 400 K/uL  RENAL FUNCTION PANEL     Status: Abnormal   Collection Time    05/09/12 11:02 PM      Result Value Range   Sodium 129 (*) 135 - 145 mEq/L   Comment: DELTA CHECK NOTED   Potassium 4.3  3.5 - 5.1 mEq/L   Chloride 89 (*) 96 - 112 mEq/L   CO2 23  19 - 32 mEq/L   Glucose, Bld 241 (*) 70 - 99 mg/dL   BUN 75 (*) 6 -  23 mg/dL   Creatinine, Ser 10.48 (*) 0.50 - 1.10 mg/dL   Calcium 8.3 (*) 8.4 - 10.5 mg/dL   Phosphorus 6.4 (*) 2.3 - 4.6 mg/dL   Albumin 3.1 (*) 3.5 - 5.2 g/dL   GFR calc non Af Amer 4 (*) >90 mL/min   GFR calc Af Amer 5 (*) >90 mL/min   Comment:            The eGFR has been calculated     using the CKD EPI equation.     This calculation has not been     validated in all clinical     situations.     eGFR's  persistently     <90 mL/min signify     possible Chronic Kidney Disease.  GLUCOSE, CAPILLARY     Status: Abnormal   Collection Time    05/10/12  2:39 AM      Result Value Range   Glucose-Capillary 158 (*) 70 - 99 mg/dL   Comment 1 Notify RN     Comment 2 Documented in Chart    GLUCOSE, CAPILLARY     Status: Abnormal   Collection Time    05/10/12  7:32 AM      Result Value Range   Glucose-Capillary 156 (*) 70 - 99 mg/dL   Comment 1 Documented in Chart     Comment 2 Notify RN    GLUCOSE, CAPILLARY     Status: Abnormal   Collection Time    05/10/12 11:51 AM      Result Value Range   Glucose-Capillary 183 (*) 70 - 99 mg/dL  GLUCOSE, CAPILLARY     Status: Abnormal   Collection Time    05/10/12  5:00 PM      Result Value Range   Glucose-Capillary 128 (*) 70 - 99 mg/dL    Mr Brain Wo Contrast  05/09/2012  *RADIOLOGY REPORT*  Clinical Data: Acute personality change.  Rule out mass.  Diabetes and renal failure  MRI HEAD WITHOUT CONTRAST  Technique:  Multiplanar, multiecho pulse sequences of the brain and surrounding structures were obtained according to standard protocol without intravenous contrast.  Comparison: None  Findings: Generalized atrophy.  No significant acute or chronic ischemia is identified.  Negative for mass or edema.  No midline shift.  Ventricle size is normal.  Negative for hemorrhage.  Paranasal sinuses show mild mucosal edema.  IMPRESSION: No acute abnormality.  Mild atrophy without significant ischemic change.   Original Report Authenticated By: Carl Best, M.D.     ROS Blood pressure 135/75, pulse 87, temperature 98.2 F (36.8 C), temperature source Oral, resp. rate 18, height 5\' 2"  (1.575 m), weight 71.2 kg (156 lb 15.5 oz), SpO2 98.00%. Physical Exam  Mental Status Examination/Evaluation:  Appearance: on bed  Eye Contact:: Good  Speech: normal  Volume: Normal  Mood: ok  Affect: ristricted  Thought Process: organized  Orientation: Full  Thought  Content: NO AVH  Suicidal Thoughts: No  Homicidal Thoughts: no  Memory: Recent; Poor  Judgement:  fair  Insight: fair  Psychomotor Activity: Normal  Concentration: Fair  Recall: Fair  Akathisia: No  Assessment:  AXIS I: adjustment d/o with mixed emotions  AXIS II: Deferred  AXIS III: see emdical hx ? ?  ? ?  ?  ? ?  ?  ? ?  ?  ? ?  ?  ?  AXIS IV: conflict with husband  AXIS V: 50  ?  Treatment Plan/Recommendations:  1. Offered psy in  pt. But pt not interested. Pt denies any SI thought now. Tried to call husband  3 times but no luck. Pt agreed with out pt treatment. Pt can be discharged if pt's husband feels safe to take her home at this time.  2. Will follow as needed  Raiford Simmonds 05/10/2012, 9:13 PM

## 2012-05-10 NOTE — Progress Notes (Signed)
Patient and spouse requesting female sitters only. Charge and [employer] notified. Will continue to monitor.

## 2012-05-11 LAB — GLUCOSE, CAPILLARY
Glucose-Capillary: 202 mg/dL — ABNORMAL HIGH (ref 70–99)
Glucose-Capillary: 136 mg/dL — ABNORMAL HIGH (ref 70–99)

## 2012-05-11 MED ORDER — DOXERCALCIFEROL 4 MCG/2ML IV SOLN
INTRAVENOUS | Status: AC
  Administered 2012-05-11: 6 ug via INTRAVENOUS
  Filled 2012-05-11: qty 4

## 2012-05-11 MED ORDER — CINACALCET HCL 30 MG PO TABS
60.0000 mg | ORAL_TABLET | Freq: Every day | ORAL | Status: DC
  Administered 2012-05-12: 60 mg via ORAL
  Filled 2012-05-11 (×2): qty 2

## 2012-05-11 NOTE — Progress Notes (Signed)
Subjective:  No cos, hd late last pm tolerated Objective Vital signs in last 24 hours: Filed Vitals:   05/11/12 0400 05/11/12 0430 05/11/12 0500 05/11/12 0515  BP: 124/88 113/58 98/47 126/56  Pulse: 90 98 95 91  Temp:    97.7 F (36.5 C)  TempSrc:    Oral  Resp: 16 16 16 18   Height:      Weight:    69.1 kg (152 lb 5.4 oz)  SpO2:    99%   Weight change: 0.35 kg (12.3 oz)  Intake/Output Summary (Last 24 hours) at 05/11/12 0843 Last data filed at 05/11/12 0515  Gross per 24 hour  Intake    360 ml  Output   3300 ml  Net  -2940 ml   Labs: Basic Metabolic Panel:  Recent Labs Lab 05/08/12 1706 05/09/12 2302  NA 137 129*  K 4.0 4.3  CL 94* 89*  CO2 22 23  GLUCOSE 69* 241*  BUN 56* 75*  CREATININE 9.17* 10.48*  CALCIUM 8.8 8.3*  PHOS  --  6.4*   Liver Function Tests:  Recent Labs Lab 05/08/12 1706 05/09/12 2302  AST 22  --   ALT 21  --   ALKPHOS 95  --   BILITOT 0.3  --   PROT 8.0  --   ALBUMIN 3.5 3.1*    Recent Labs Lab 05/08/12 1706  LIPASE 37   No results found for this basename: AMMONIA,  in the last 168 hours CBC:  Recent Labs Lab 05/08/12 1706 05/09/12 2302  WBC 7.5 6.9  NEUTROABS 3.3  --   HGB 14.4 11.9*  HCT 41.8 34.3*  MCV 88.0 86.2  PLT 312 342   Cardiac Enzymes: No results found for this basename: CKTOTAL, CKMB, CKMBINDEX, TROPONINI,  in the last 168 hours CBG:  Recent Labs Lab 05/10/12 0732 05/10/12 1151 05/10/12 1700 05/10/12 2125 05/11/12 0733  GLUCAP 156* 183* 128* 173* 129*    Iron Studies: No results found for this basename: IRON, TIBC, TRANSFERRIN, FERRITIN,  in the last 72 hours Studies/Results: Mr Brain Wo Contrast  05/09/2012  *RADIOLOGY REPORT*  Clinical Data: Acute personality change.  Rule out mass.  Diabetes and renal failure  MRI HEAD WITHOUT CONTRAST  Technique:  Multiplanar, multiecho pulse sequences of the brain and surrounding structures were obtained according to standard protocol without intravenous  contrast.  Comparison: None  Findings: Generalized atrophy.  No significant acute or chronic ischemia is identified.  Negative for mass or edema.  No midline shift.  Ventricle size is normal.  Negative for hemorrhage.  Paranasal sinuses show mild mucosal edema.  IMPRESSION: No acute abnormality.  Mild atrophy without significant ischemic change.   Original Report Authenticated By: Carl Best, M.D.    Medications:   . doxercalciferol  6 mcg Intravenous Q T,Th,Sa-HD  . [START ON 05/15/2012] ferric gluconate (FERRLECIT/NULECIT) IV  62.5 mg Intravenous Q Thu-HD  . heparin  5,000 Units Subcutaneous Q8H  . insulin aspart  0-9 Units Subcutaneous TID WC  . sevelamer carbonate  2,400 mg Oral TID WC   I  have reviewed scheduled and prn medications.  Physical Exam: General: Alert, nad, sitter in room Heart: RRR Lungs: CTA  Abdomen: soft, nontender Extremities: Dialysis Access: no pedal edema, positive bruit LU A AVF   [facility] 4hr EDW 70kg 2K/2.0Ca 400/A1.5 Heparin 7000u EPO none Hectorol 6ug/hd Venofer 50/wk   Impression/Plan  1. Medication overdose w insuline and Actos- per primary 2. Depression with reported  Suicide attempt = Psy seeing 3. ESRD,=(TTS , [facility]) missed hd Thursday, HD last night  Post wt to  69.1/  4. HTN/volume- BP=126/56 this am/ not on any BP medications at home.or hospital. Sl below edw of 70 kg. 5. Anemia of CKD- no EPO, Hb 14> 11.9/ fu hgb weekly IV iron 6. Secondary HPTH- sensipar 60/d, start sevelamer 2400ac With phos=6.4, vit D on hd/. Ca ok. 7. DM- takes insulin and oral agents    Ernest Haber, PA-C [facility] Bee[facility] [phone] 05/11/2012,8:43 AM  LOS: 3 days   Patient seen and examined.  Agree with assessment and plan as above. Kelly Splinter  MD [phone] pgr    [phone] cell 05/11/2012, 1:00 PM
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
## 2012-05-12 LAB — GLUCOSE, CAPILLARY: Glucose-Capillary: 221 mg/dL — ABNORMAL HIGH (ref 70–99)

## 2012-05-12 MED ORDER — RENA-VITE PO TABS
1.0000 | ORAL_TABLET | Freq: Every day | ORAL | Status: DC
  Administered 2012-05-12: 10:00:00 via ORAL
  Filled 2012-05-12: qty 1

## 2012-05-12 NOTE — Progress Notes (Signed)
Pt alert and oriented,VSS no neuro changes noted , understands all discharge by repeating fu care , meds and also noted education on su cide and community  Resources ."I am ready to go home waiting on my husband  To come back to pick me up ." No other concerns noted or voiced . IV came out in the shower

## 2012-05-12 NOTE — Progress Notes (Signed)
Utilization review completed.  

## 2012-05-12 NOTE — Consult Note (Signed)
Patient Identification:  Morgan [name] Date of Evaluation:  05/12/2012 Reason for Consult:  Overdose, Suicide attempt  F/U  Referring Provider: Dr. Wynelle Cleveland  ERROR:    Already Discharged today  Past Medical History:     Past Medical History  Diagnosis Date  . Diabetes mellitus without complication   . Renal disorder        Past Surgical History  Procedure Laterality Date  . Cesarean section    . Eye surgery      retinal detachment    Allergies:  Allergies  Allergen Reactions  . Iohexol      Code: HIVES, Desc: ITCHING, HIVES, SWELLING- 13 HR PRE-MEDS REQUIRED- ASM, Onset Date: [id number]   . Latex     unknown    Current Medications:  Prior to Admission medications   Medication Sig Start Date End Date Taking? Authorizing Provider  cinacalcet (SENSIPAR) 30 MG tablet Take 60 mg by mouth daily.   Yes Historical Provider, MD  insulin glargine (LANTUS) 100 UNIT/ML injection Inject 30 Units into the skin daily.   Yes Historical Provider, MD  pioglitazone (ACTOS) 15 MG tablet Take 15 mg by mouth daily.   Yes Historical Provider, MD  sevelamer (RENVELA) 800 MG tablet Take 2,400 mg by mouth 3 (three) times daily with meals.   Yes Historical Provider, MD    Social History:    reports that she has never smoked. She has never used smokeless tobacco. She reports that she does not drink alcohol. Her drug history is not on file.   Family History:    History reviewed. No pertinent family history.   Assessment/Plan:  Dr. Wynelle Cleveland,  Psych CSW Error  Already Discharged today  Maryruth Eve MD 05/12/2012 2:30 PM

## 2012-05-12 NOTE — Progress Notes (Signed)
  [facility] Progress Note  Subjective:   No problems with dialysis Saturday. Dislikes the food.  Objective Filed Vitals:   05/11/12 1304 05/11/12 1713 05/11/12 2005 05/12/12 0411  BP: 122/61 116/76 143/86 131/75  Pulse: 100 95 88 88  Temp: 98.7 F (37.1 C) 98 F (36.7 C) 98.2 F (36.8 C) 98 F (36.7 C)  TempSrc: Oral Oral Oral Oral  Resp: 18 18 18 18   Height:   5\' 2"  (1.575 m)   Weight:   69.1 kg (152 lb 5.4 oz)   SpO2: 99% 98% 99% 99%   Physical Exam General: NAD sitting in bed Heart: RRR Lungs: no wheezes or rales Abdomen: soft Extremities: no LE edema Dialysis Access:  Left upper AVGG + bruit  [facility] 4hr EDW 70kg 2K/2.0Ca 400/A1.5 Heparin 7000u EPO none Hectorol 6ug/hd Venofer 50/wk   Assessment/Plan: 1. Medication overdose w insuline and Actos in the setting of marital conflict; agrees to outpt counseling; has  2. Chronic depression/anger issues- needs psychotherapy - other prior issues plus now chronic dialysis x 1.5 years 3. ESRD,=TTS - HD orders written for tomorrow 4. HTN/volume- volume control; no meds 5. Anemia of CKD- no EPO, Hb 14> 11.9/ fu hgb weekly IV iron - no ESA 6. Secondary HPTH- sensipar 60/d, start sevelamer 2400ac With phos=6.4, vit D on hd/. Ca ok, /  7. DM- takes insulin and oral agents 8. Nutrition -- renal diet + multivitamin   [name], PA-C [facility] Beeper [phone] 05/12/2012,8:20 AM  LOS: 4 days    Additional Objective Labs: Basic Metabolic Panel:  Recent Labs Lab 05/08/12 1706 05/09/12 2302  NA 137 129*  K 4.0 4.3  CL 94* 89*  CO2 22 23  GLUCOSE 69* 241*  BUN 56* 75*  CREATININE 9.17* 10.48*  CALCIUM 8.8 8.3*  PHOS  --  6.4*   Liver Function Tests:  Recent Labs Lab 05/08/12 1706 05/09/12 2302  AST 22  --   ALT 21  --   ALKPHOS 95  --   BILITOT 0.3  --   PROT 8.0  --   ALBUMIN 3.5 3.1*    Recent Labs Lab 05/08/12 1706  LIPASE 37   CBC:  Recent  Labs Lab 05/08/12 1706 05/09/12 2302  WBC 7.5 6.9  NEUTROABS 3.3  --   HGB 14.4 11.9*  HCT 41.8 34.3*  MCV 88.0 86.2  PLT 312 342  CBG:  Recent Labs Lab 05/11/12 1712 05/11/12 2008 05/12/12 0005 05/12/12 0346 05/12/12 0738  GLUCAP 136* 207* 221* 146* 141*   Medications:   . cinacalcet  60 mg Oral Q breakfast  . doxercalciferol  6 mcg Intravenous Q T,Th,Sa-HD  . [START ON 05/15/2012] ferric gluconate (FERRLECIT/NULECIT) IV  62.5 mg Intravenous Q Thu-HD  . heparin  5,000 Units Subcutaneous Q8H  . insulin aspart  0-9 Units Subcutaneous TID WC  . sevelamer carbonate  2,400 mg Oral TID WC    Stable, feels better and mood stabilizing, hemodynamics are stable. For HD Tuesday. Morgan [name] C

## 2012-05-12 NOTE — Progress Notes (Signed)
Clinical Social Work Department CLINICAL SOCIAL WORK PSYCHIATRY SERVICE LINE ASSESSMENT 05/12/2012  Patient:  Morgan [name]  Account:  [id number]  Admit Date:  05/08/2012  Clinical Social Worker:  Daiva Huge  Date/Time:  05/12/2012 12:34 PM Referred by:  Physician  Date referred:  05/12/2012 Reason for Referral  Behavioral Health Issues   Presenting Symptoms/Problems (In the person's/family's own words):   Patient admits to the overdose of her meds- regrets thtis action and is glad she was not successful in killing herself.   Abuse/Neglect/Trauma History (check all that apply)  Denies history   Abuse/Neglect/Trauma Comments:   Psychiatric History (check all that apply)  Outpatient treatment   Psychiatric medications:  Current Mental Health Hospitalizations/Previous Mental Health History:   Patient denies any history of suicidal thoughts/attempts- states she sees Ecologist at the [facility] a regular basis- already has an appointment set for this Thursday.   Current provider:   Marita Snellen @ [place] and Date:   Thursday March 17th- 1pm   Current Medications:   .  cinacalcet   60 mg  Oral  Q breakfast  .  doxercalciferol   6 mcg  Intravenous  Q T,Th,Sa-HD  .  [START ON 05/15/2012] ferric gluconate (FERRLECIT/NULECIT) IV   62.5 mg  Intravenous  Q Thu-HD  .  heparin   5,000 Units  Subcutaneous  Q8H  .  insulin aspart   0-9 Units  Subcutaneous  TID WC  .  sevelamer carbonate   2,400 mg  Oral  TID WC   Previous Impatient Admission/Date/Reason:   N/A   Emotional Health / Current Symptoms    Suicide/Self Harm  None reported   Suicide attempt in the past:   05/08/12- this admission   Other harmful behavior:   Psychotic/Dissociative Symptoms  None reported   Other Psychotic/Dissociative Symptoms:    Attention/Behavioral Symptoms  Other - See comment   Other Attention / Behavioral Symptoms:     Other  Cognitive Impairment:      Stress, Anxiety, Trauma, Any Recent Loss/Stressor  Anxiety  Relationship   Anxiety (frequency):   Phobia (specify):   Compulsive behavior (specify):   Obsessive behavior (specify):   Other:   finances, death of her grandmother whom she was very close to, marital issues   Substance Abuse/Use  None   SBIRT completed (please refer for detailed history):  N  Self-reported substance use:   Urinary Drug Screen Completed:   Alcohol level:    Environmental/Housing/Living Arrangement  With Family Member   Who is in the home:   husband  13yo daughter   Emergency contact:  Product manager   Patient's Strengths and Goals (patient's own words):   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 She is motivated for professional growth and is eager to get her personal life(marriage) figured out as well.   Clinical Social Worker's Interpretive Summary:   Patient is very open and eager to talk about her anger issues which are primarily related to her marital issues-  She is trying to work out plans with her husband related to their marriage and decisions related to separation, marriage reconciliation, custody of daughter, etc.   Disposition:  Outpatient referral made/needed Eduard Clos, MSW, [place]
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
## 2012-05-15 DIAGNOSIS — F339 Major depressive disorder, recurrent, unspecified: Secondary | ICD-10-CM | POA: Diagnosis not present

## 2012-05-20 ENCOUNTER — Ambulatory Visit (HOSPITAL_COMMUNITY): Payer: BC Managed Care – PPO | Admitting: Psychiatry

## 2012-05-26 ENCOUNTER — Telehealth (HOSPITAL_COMMUNITY): Payer: Self-pay

## 2012-05-26 ENCOUNTER — Ambulatory Visit (HOSPITAL_COMMUNITY): Payer: Self-pay | Admitting: Psychiatry

## 2012-05-26 NOTE — Telephone Encounter (Signed)
05/26/12 pt called requesting to r/s stating that she had just left another dr's office - informed the pt that the protocol is that only 2-r/s for new patient 3rd r/s is referral outisde the practice.  Gave pt 2- referrals to schedule appt/sh

## 2012-05-30 ENCOUNTER — Ambulatory Visit: Payer: Self-pay | Admitting: Internal Medicine

## 2012-05-30 DIAGNOSIS — Z0289 Encounter for other administrative examinations: Secondary | ICD-10-CM

## 2012-06-06 DIAGNOSIS — F339 Major depressive disorder, recurrent, unspecified: Secondary | ICD-10-CM | POA: Diagnosis not present

## 2012-06-27 DIAGNOSIS — F339 Major depressive disorder, recurrent, unspecified: Secondary | ICD-10-CM | POA: Diagnosis not present

## 2012-07-16 DIAGNOSIS — F339 Major depressive disorder, recurrent, unspecified: Secondary | ICD-10-CM | POA: Diagnosis not present

## 2012-08-15 DIAGNOSIS — F339 Major depressive disorder, recurrent, unspecified: Secondary | ICD-10-CM | POA: Diagnosis not present

## 2013-04-10 DIAGNOSIS — N3090 Cystitis, unspecified without hematuria: Secondary | ICD-10-CM | POA: Diagnosis not present

## 2013-04-10 DIAGNOSIS — R300 Dysuria: Secondary | ICD-10-CM | POA: Diagnosis not present

## 2013-04-10 DIAGNOSIS — N760 Acute vaginitis: Secondary | ICD-10-CM | POA: Diagnosis not present

## 2013-06-24 DIAGNOSIS — F4323 Adjustment disorder with mixed anxiety and depressed mood: Secondary | ICD-10-CM | POA: Diagnosis not present

## 2013-08-26 ENCOUNTER — Ambulatory Visit (HOSPITAL_COMMUNITY)
Admission: RE | Admit: 2013-08-26 | Discharge: 2013-08-26 | Disposition: A | Discharge status: Home / Self Care | Payer: Medicare Other | Source: Ambulatory Visit | Attending: Nephrology | Admitting: Nephrology

## 2013-08-26 ENCOUNTER — Other Ambulatory Visit (HOSPITAL_COMMUNITY): Payer: Self-pay | Admitting: Nephrology

## 2013-08-26 DIAGNOSIS — R059 Cough, unspecified: Secondary | ICD-10-CM | POA: Diagnosis not present

## 2013-08-26 DIAGNOSIS — R05 Cough: Secondary | ICD-10-CM

## 2013-08-26 DIAGNOSIS — R0989 Other specified symptoms and signs involving the circulatory and respiratory systems: Secondary | ICD-10-CM | POA: Diagnosis not present

## 2013-08-27 DIAGNOSIS — N2581 Secondary hyperparathyroidism of renal origin: Secondary | ICD-10-CM | POA: Diagnosis not present

## 2013-08-27 DIAGNOSIS — D509 Iron deficiency anemia, unspecified: Secondary | ICD-10-CM | POA: Diagnosis not present

## 2013-08-27 DIAGNOSIS — N186 End stage renal disease: Secondary | ICD-10-CM | POA: Diagnosis not present

## 2013-08-27 DIAGNOSIS — E1129 Type 2 diabetes mellitus with other diabetic kidney complication: Secondary | ICD-10-CM | POA: Diagnosis not present

## 2013-09-17 DIAGNOSIS — E1129 Type 2 diabetes mellitus with other diabetic kidney complication: Secondary | ICD-10-CM | POA: Diagnosis not present

## 2013-09-23 DIAGNOSIS — I871 Compression of vein: Secondary | ICD-10-CM | POA: Diagnosis not present

## 2013-09-23 DIAGNOSIS — T82898A Other specified complication of vascular prosthetic devices, implants and grafts, initial encounter: Secondary | ICD-10-CM | POA: Diagnosis not present

## 2013-09-23 DIAGNOSIS — N186 End stage renal disease: Secondary | ICD-10-CM | POA: Diagnosis not present

## 2013-09-25 DIAGNOSIS — N186 End stage renal disease: Secondary | ICD-10-CM | POA: Diagnosis not present

## 2013-09-26 DIAGNOSIS — E1129 Type 2 diabetes mellitus with other diabetic kidney complication: Secondary | ICD-10-CM | POA: Diagnosis not present

## 2013-09-26 DIAGNOSIS — D509 Iron deficiency anemia, unspecified: Secondary | ICD-10-CM | POA: Diagnosis not present

## 2013-09-26 DIAGNOSIS — N186 End stage renal disease: Secondary | ICD-10-CM | POA: Diagnosis not present

## 2013-09-26 DIAGNOSIS — N2581 Secondary hyperparathyroidism of renal origin: Secondary | ICD-10-CM | POA: Diagnosis not present

## 2013-10-05 DIAGNOSIS — N760 Acute vaginitis: Secondary | ICD-10-CM | POA: Diagnosis not present

## 2013-10-05 DIAGNOSIS — N949 Unspecified condition associated with female genital organs and menstrual cycle: Secondary | ICD-10-CM | POA: Diagnosis not present

## 2013-10-05 DIAGNOSIS — Z30431 Encounter for routine checking of intrauterine contraceptive device: Secondary | ICD-10-CM | POA: Diagnosis not present

## 2013-10-16 DIAGNOSIS — N186 End stage renal disease: Secondary | ICD-10-CM | POA: Diagnosis not present

## 2013-10-16 DIAGNOSIS — T82898A Other specified complication of vascular prosthetic devices, implants and grafts, initial encounter: Secondary | ICD-10-CM | POA: Diagnosis not present

## 2013-10-16 DIAGNOSIS — I871 Compression of vein: Secondary | ICD-10-CM | POA: Diagnosis not present

## 2013-10-26 DIAGNOSIS — N186 End stage renal disease: Secondary | ICD-10-CM | POA: Diagnosis not present

## 2013-10-27 DIAGNOSIS — N2581 Secondary hyperparathyroidism of renal origin: Secondary | ICD-10-CM | POA: Diagnosis not present

## 2013-10-27 DIAGNOSIS — E1129 Type 2 diabetes mellitus with other diabetic kidney complication: Secondary | ICD-10-CM | POA: Diagnosis not present

## 2013-10-27 DIAGNOSIS — N186 End stage renal disease: Secondary | ICD-10-CM | POA: Diagnosis not present

## 2013-10-27 DIAGNOSIS — D509 Iron deficiency anemia, unspecified: Secondary | ICD-10-CM | POA: Diagnosis not present

## 2013-11-13 DIAGNOSIS — I871 Compression of vein: Secondary | ICD-10-CM | POA: Diagnosis not present

## 2013-11-13 DIAGNOSIS — N186 End stage renal disease: Secondary | ICD-10-CM | POA: Diagnosis not present

## 2013-11-13 DIAGNOSIS — T82898A Other specified complication of vascular prosthetic devices, implants and grafts, initial encounter: Secondary | ICD-10-CM | POA: Diagnosis not present

## 2013-11-25 DIAGNOSIS — N186 End stage renal disease: Secondary | ICD-10-CM | POA: Diagnosis not present

## 2013-11-26 DIAGNOSIS — Z23 Encounter for immunization: Secondary | ICD-10-CM | POA: Diagnosis not present

## 2013-11-26 DIAGNOSIS — E1129 Type 2 diabetes mellitus with other diabetic kidney complication: Secondary | ICD-10-CM | POA: Diagnosis not present

## 2013-11-26 DIAGNOSIS — N186 End stage renal disease: Secondary | ICD-10-CM | POA: Diagnosis not present

## 2013-11-26 DIAGNOSIS — D509 Iron deficiency anemia, unspecified: Secondary | ICD-10-CM | POA: Diagnosis not present

## 2013-11-28 DIAGNOSIS — N186 End stage renal disease: Secondary | ICD-10-CM | POA: Diagnosis not present

## 2013-11-28 DIAGNOSIS — Z23 Encounter for immunization: Secondary | ICD-10-CM | POA: Diagnosis not present

## 2013-11-28 DIAGNOSIS — E1129 Type 2 diabetes mellitus with other diabetic kidney complication: Secondary | ICD-10-CM | POA: Diagnosis not present

## 2013-11-28 DIAGNOSIS — D509 Iron deficiency anemia, unspecified: Secondary | ICD-10-CM | POA: Diagnosis not present

## 2013-12-01 DIAGNOSIS — E1129 Type 2 diabetes mellitus with other diabetic kidney complication: Secondary | ICD-10-CM | POA: Diagnosis not present

## 2013-12-01 DIAGNOSIS — N186 End stage renal disease: Secondary | ICD-10-CM | POA: Diagnosis not present

## 2013-12-01 DIAGNOSIS — Z23 Encounter for immunization: Secondary | ICD-10-CM | POA: Diagnosis not present

## 2013-12-01 DIAGNOSIS — D509 Iron deficiency anemia, unspecified: Secondary | ICD-10-CM | POA: Diagnosis not present

## 2013-12-03 DIAGNOSIS — N186 End stage renal disease: Secondary | ICD-10-CM | POA: Diagnosis not present

## 2013-12-03 DIAGNOSIS — D509 Iron deficiency anemia, unspecified: Secondary | ICD-10-CM | POA: Diagnosis not present

## 2013-12-03 DIAGNOSIS — E1129 Type 2 diabetes mellitus with other diabetic kidney complication: Secondary | ICD-10-CM | POA: Diagnosis not present

## 2013-12-03 DIAGNOSIS — Z23 Encounter for immunization: Secondary | ICD-10-CM | POA: Diagnosis not present

## 2013-12-05 DIAGNOSIS — E1129 Type 2 diabetes mellitus with other diabetic kidney complication: Secondary | ICD-10-CM | POA: Diagnosis not present

## 2013-12-05 DIAGNOSIS — Z23 Encounter for immunization: Secondary | ICD-10-CM | POA: Diagnosis not present

## 2013-12-05 DIAGNOSIS — D509 Iron deficiency anemia, unspecified: Secondary | ICD-10-CM | POA: Diagnosis not present

## 2013-12-05 DIAGNOSIS — N186 End stage renal disease: Secondary | ICD-10-CM | POA: Diagnosis not present

## 2013-12-08 DIAGNOSIS — N186 End stage renal disease: Secondary | ICD-10-CM | POA: Diagnosis not present

## 2013-12-08 DIAGNOSIS — E1129 Type 2 diabetes mellitus with other diabetic kidney complication: Secondary | ICD-10-CM | POA: Diagnosis not present

## 2013-12-08 DIAGNOSIS — D509 Iron deficiency anemia, unspecified: Secondary | ICD-10-CM | POA: Diagnosis not present

## 2013-12-08 DIAGNOSIS — Z23 Encounter for immunization: Secondary | ICD-10-CM | POA: Diagnosis not present

## 2013-12-10 DIAGNOSIS — Z23 Encounter for immunization: Secondary | ICD-10-CM | POA: Diagnosis not present

## 2013-12-10 DIAGNOSIS — E1129 Type 2 diabetes mellitus with other diabetic kidney complication: Secondary | ICD-10-CM | POA: Diagnosis not present

## 2013-12-10 DIAGNOSIS — N186 End stage renal disease: Secondary | ICD-10-CM | POA: Diagnosis not present

## 2013-12-10 DIAGNOSIS — D509 Iron deficiency anemia, unspecified: Secondary | ICD-10-CM | POA: Diagnosis not present

## 2013-12-12 DIAGNOSIS — D509 Iron deficiency anemia, unspecified: Secondary | ICD-10-CM | POA: Diagnosis not present

## 2013-12-12 DIAGNOSIS — N186 End stage renal disease: Secondary | ICD-10-CM | POA: Diagnosis not present

## 2013-12-12 DIAGNOSIS — E1129 Type 2 diabetes mellitus with other diabetic kidney complication: Secondary | ICD-10-CM | POA: Diagnosis not present

## 2013-12-12 DIAGNOSIS — Z23 Encounter for immunization: Secondary | ICD-10-CM | POA: Diagnosis not present

## 2013-12-15 DIAGNOSIS — E1129 Type 2 diabetes mellitus with other diabetic kidney complication: Secondary | ICD-10-CM | POA: Diagnosis not present

## 2013-12-15 DIAGNOSIS — D509 Iron deficiency anemia, unspecified: Secondary | ICD-10-CM | POA: Diagnosis not present

## 2013-12-15 DIAGNOSIS — Z23 Encounter for immunization: Secondary | ICD-10-CM | POA: Diagnosis not present

## 2013-12-15 DIAGNOSIS — N186 End stage renal disease: Secondary | ICD-10-CM | POA: Diagnosis not present

## 2013-12-17 DIAGNOSIS — E1129 Type 2 diabetes mellitus with other diabetic kidney complication: Secondary | ICD-10-CM | POA: Diagnosis not present

## 2013-12-17 DIAGNOSIS — N186 End stage renal disease: Secondary | ICD-10-CM | POA: Diagnosis not present

## 2013-12-17 DIAGNOSIS — Z23 Encounter for immunization: Secondary | ICD-10-CM | POA: Diagnosis not present

## 2013-12-17 DIAGNOSIS — D509 Iron deficiency anemia, unspecified: Secondary | ICD-10-CM | POA: Diagnosis not present

## 2013-12-19 DIAGNOSIS — D509 Iron deficiency anemia, unspecified: Secondary | ICD-10-CM | POA: Diagnosis not present

## 2013-12-19 DIAGNOSIS — N186 End stage renal disease: Secondary | ICD-10-CM | POA: Diagnosis not present

## 2013-12-19 DIAGNOSIS — Z23 Encounter for immunization: Secondary | ICD-10-CM | POA: Diagnosis not present

## 2013-12-19 DIAGNOSIS — E1129 Type 2 diabetes mellitus with other diabetic kidney complication: Secondary | ICD-10-CM | POA: Diagnosis not present

## 2013-12-22 DIAGNOSIS — Z23 Encounter for immunization: Secondary | ICD-10-CM | POA: Diagnosis not present

## 2013-12-22 DIAGNOSIS — E1129 Type 2 diabetes mellitus with other diabetic kidney complication: Secondary | ICD-10-CM | POA: Diagnosis not present

## 2013-12-22 DIAGNOSIS — N186 End stage renal disease: Secondary | ICD-10-CM | POA: Diagnosis not present

## 2013-12-22 DIAGNOSIS — D509 Iron deficiency anemia, unspecified: Secondary | ICD-10-CM | POA: Diagnosis not present

## 2013-12-23 DIAGNOSIS — Z1231 Encounter for screening mammogram for malignant neoplasm of breast: Secondary | ICD-10-CM | POA: Diagnosis not present

## 2013-12-23 DIAGNOSIS — N760 Acute vaginitis: Secondary | ICD-10-CM | POA: Diagnosis not present

## 2013-12-24 DIAGNOSIS — E1129 Type 2 diabetes mellitus with other diabetic kidney complication: Secondary | ICD-10-CM | POA: Diagnosis not present

## 2013-12-24 DIAGNOSIS — N186 End stage renal disease: Secondary | ICD-10-CM | POA: Diagnosis not present

## 2013-12-24 DIAGNOSIS — D509 Iron deficiency anemia, unspecified: Secondary | ICD-10-CM | POA: Diagnosis not present

## 2013-12-24 DIAGNOSIS — Z23 Encounter for immunization: Secondary | ICD-10-CM | POA: Diagnosis not present

## 2013-12-26 DIAGNOSIS — N186 End stage renal disease: Secondary | ICD-10-CM | POA: Diagnosis not present

## 2013-12-26 DIAGNOSIS — E1129 Type 2 diabetes mellitus with other diabetic kidney complication: Secondary | ICD-10-CM | POA: Diagnosis not present

## 2013-12-26 DIAGNOSIS — Z992 Dependence on renal dialysis: Secondary | ICD-10-CM | POA: Diagnosis not present

## 2013-12-26 DIAGNOSIS — D509 Iron deficiency anemia, unspecified: Secondary | ICD-10-CM | POA: Diagnosis not present

## 2013-12-26 DIAGNOSIS — Z23 Encounter for immunization: Secondary | ICD-10-CM | POA: Diagnosis not present

## 2013-12-29 DIAGNOSIS — D509 Iron deficiency anemia, unspecified: Secondary | ICD-10-CM | POA: Diagnosis not present

## 2013-12-29 DIAGNOSIS — N2581 Secondary hyperparathyroidism of renal origin: Secondary | ICD-10-CM | POA: Diagnosis not present

## 2013-12-29 DIAGNOSIS — E1129 Type 2 diabetes mellitus with other diabetic kidney complication: Secondary | ICD-10-CM | POA: Diagnosis not present

## 2013-12-29 DIAGNOSIS — N186 End stage renal disease: Secondary | ICD-10-CM | POA: Diagnosis not present

## 2014-01-15 DIAGNOSIS — I871 Compression of vein: Secondary | ICD-10-CM | POA: Diagnosis not present

## 2014-01-15 DIAGNOSIS — N186 End stage renal disease: Secondary | ICD-10-CM | POA: Diagnosis not present

## 2014-01-15 DIAGNOSIS — Z992 Dependence on renal dialysis: Secondary | ICD-10-CM | POA: Diagnosis not present

## 2014-01-15 DIAGNOSIS — T82858A Stenosis of vascular prosthetic devices, implants and grafts, initial encounter: Secondary | ICD-10-CM | POA: Diagnosis not present

## 2014-01-25 DIAGNOSIS — N186 End stage renal disease: Secondary | ICD-10-CM | POA: Diagnosis not present

## 2014-01-25 DIAGNOSIS — Z992 Dependence on renal dialysis: Secondary | ICD-10-CM | POA: Diagnosis not present

## 2014-01-26 DIAGNOSIS — D509 Iron deficiency anemia, unspecified: Secondary | ICD-10-CM | POA: Diagnosis not present

## 2014-01-26 DIAGNOSIS — N2581 Secondary hyperparathyroidism of renal origin: Secondary | ICD-10-CM | POA: Diagnosis not present

## 2014-01-26 DIAGNOSIS — N186 End stage renal disease: Secondary | ICD-10-CM | POA: Diagnosis not present

## 2014-01-26 DIAGNOSIS — E1129 Type 2 diabetes mellitus with other diabetic kidney complication: Secondary | ICD-10-CM | POA: Diagnosis not present

## 2014-02-25 DIAGNOSIS — Z992 Dependence on renal dialysis: Secondary | ICD-10-CM | POA: Diagnosis not present

## 2014-02-25 DIAGNOSIS — N186 End stage renal disease: Secondary | ICD-10-CM | POA: Diagnosis not present

## 2014-02-28 DIAGNOSIS — E1129 Type 2 diabetes mellitus with other diabetic kidney complication: Secondary | ICD-10-CM | POA: Diagnosis not present

## 2014-02-28 DIAGNOSIS — N2581 Secondary hyperparathyroidism of renal origin: Secondary | ICD-10-CM | POA: Diagnosis not present

## 2014-02-28 DIAGNOSIS — D509 Iron deficiency anemia, unspecified: Secondary | ICD-10-CM | POA: Diagnosis not present

## 2014-02-28 DIAGNOSIS — N186 End stage renal disease: Secondary | ICD-10-CM | POA: Diagnosis not present

## 2014-03-02 DIAGNOSIS — N186 End stage renal disease: Secondary | ICD-10-CM | POA: Diagnosis not present

## 2014-03-02 DIAGNOSIS — D509 Iron deficiency anemia, unspecified: Secondary | ICD-10-CM | POA: Diagnosis not present

## 2014-03-02 DIAGNOSIS — E1129 Type 2 diabetes mellitus with other diabetic kidney complication: Secondary | ICD-10-CM | POA: Diagnosis not present

## 2014-03-02 DIAGNOSIS — N2581 Secondary hyperparathyroidism of renal origin: Secondary | ICD-10-CM | POA: Diagnosis not present

## 2014-03-04 DIAGNOSIS — N2581 Secondary hyperparathyroidism of renal origin: Secondary | ICD-10-CM | POA: Diagnosis not present

## 2014-03-04 DIAGNOSIS — E1129 Type 2 diabetes mellitus with other diabetic kidney complication: Secondary | ICD-10-CM | POA: Diagnosis not present

## 2014-03-04 DIAGNOSIS — D509 Iron deficiency anemia, unspecified: Secondary | ICD-10-CM | POA: Diagnosis not present

## 2014-03-04 DIAGNOSIS — N186 End stage renal disease: Secondary | ICD-10-CM | POA: Diagnosis not present

## 2014-03-09 DIAGNOSIS — N2581 Secondary hyperparathyroidism of renal origin: Secondary | ICD-10-CM | POA: Diagnosis not present

## 2014-03-09 DIAGNOSIS — N186 End stage renal disease: Secondary | ICD-10-CM | POA: Diagnosis not present

## 2014-03-09 DIAGNOSIS — E1129 Type 2 diabetes mellitus with other diabetic kidney complication: Secondary | ICD-10-CM | POA: Diagnosis not present

## 2014-03-09 DIAGNOSIS — D509 Iron deficiency anemia, unspecified: Secondary | ICD-10-CM | POA: Diagnosis not present

## 2014-03-11 DIAGNOSIS — N2581 Secondary hyperparathyroidism of renal origin: Secondary | ICD-10-CM | POA: Diagnosis not present

## 2014-03-11 DIAGNOSIS — N186 End stage renal disease: Secondary | ICD-10-CM | POA: Diagnosis not present

## 2014-03-11 DIAGNOSIS — D509 Iron deficiency anemia, unspecified: Secondary | ICD-10-CM | POA: Diagnosis not present

## 2014-03-11 DIAGNOSIS — E1129 Type 2 diabetes mellitus with other diabetic kidney complication: Secondary | ICD-10-CM | POA: Diagnosis not present

## 2014-03-12 DIAGNOSIS — N390 Urinary tract infection, site not specified: Secondary | ICD-10-CM | POA: Diagnosis not present

## 2014-03-12 DIAGNOSIS — R309 Painful micturition, unspecified: Secondary | ICD-10-CM | POA: Diagnosis not present

## 2014-03-12 DIAGNOSIS — N760 Acute vaginitis: Secondary | ICD-10-CM | POA: Diagnosis not present

## 2014-03-13 DIAGNOSIS — D509 Iron deficiency anemia, unspecified: Secondary | ICD-10-CM | POA: Diagnosis not present

## 2014-03-13 DIAGNOSIS — N186 End stage renal disease: Secondary | ICD-10-CM | POA: Diagnosis not present

## 2014-03-13 DIAGNOSIS — N2581 Secondary hyperparathyroidism of renal origin: Secondary | ICD-10-CM | POA: Diagnosis not present

## 2014-03-13 DIAGNOSIS — E1129 Type 2 diabetes mellitus with other diabetic kidney complication: Secondary | ICD-10-CM | POA: Diagnosis not present

## 2014-03-16 DIAGNOSIS — N2581 Secondary hyperparathyroidism of renal origin: Secondary | ICD-10-CM | POA: Diagnosis not present

## 2014-03-16 DIAGNOSIS — E1129 Type 2 diabetes mellitus with other diabetic kidney complication: Secondary | ICD-10-CM | POA: Diagnosis not present

## 2014-03-16 DIAGNOSIS — N186 End stage renal disease: Secondary | ICD-10-CM | POA: Diagnosis not present

## 2014-03-16 DIAGNOSIS — D509 Iron deficiency anemia, unspecified: Secondary | ICD-10-CM | POA: Diagnosis not present

## 2014-03-18 DIAGNOSIS — D509 Iron deficiency anemia, unspecified: Secondary | ICD-10-CM | POA: Diagnosis not present

## 2014-03-18 DIAGNOSIS — N2581 Secondary hyperparathyroidism of renal origin: Secondary | ICD-10-CM | POA: Diagnosis not present

## 2014-03-18 DIAGNOSIS — N186 End stage renal disease: Secondary | ICD-10-CM | POA: Diagnosis not present

## 2014-03-18 DIAGNOSIS — E1129 Type 2 diabetes mellitus with other diabetic kidney complication: Secondary | ICD-10-CM | POA: Diagnosis not present

## 2014-03-20 DIAGNOSIS — N2581 Secondary hyperparathyroidism of renal origin: Secondary | ICD-10-CM | POA: Diagnosis not present

## 2014-03-20 DIAGNOSIS — E1129 Type 2 diabetes mellitus with other diabetic kidney complication: Secondary | ICD-10-CM | POA: Diagnosis not present

## 2014-03-20 DIAGNOSIS — N186 End stage renal disease: Secondary | ICD-10-CM | POA: Diagnosis not present

## 2014-03-20 DIAGNOSIS — D509 Iron deficiency anemia, unspecified: Secondary | ICD-10-CM | POA: Diagnosis not present

## 2014-03-23 DIAGNOSIS — D509 Iron deficiency anemia, unspecified: Secondary | ICD-10-CM | POA: Diagnosis not present

## 2014-03-23 DIAGNOSIS — N2581 Secondary hyperparathyroidism of renal origin: Secondary | ICD-10-CM | POA: Diagnosis not present

## 2014-03-23 DIAGNOSIS — E1129 Type 2 diabetes mellitus with other diabetic kidney complication: Secondary | ICD-10-CM | POA: Diagnosis not present

## 2014-03-23 DIAGNOSIS — N186 End stage renal disease: Secondary | ICD-10-CM | POA: Diagnosis not present

## 2014-03-25 DIAGNOSIS — N2581 Secondary hyperparathyroidism of renal origin: Secondary | ICD-10-CM | POA: Diagnosis not present

## 2014-03-25 DIAGNOSIS — N186 End stage renal disease: Secondary | ICD-10-CM | POA: Diagnosis not present

## 2014-03-25 DIAGNOSIS — D509 Iron deficiency anemia, unspecified: Secondary | ICD-10-CM | POA: Diagnosis not present

## 2014-03-25 DIAGNOSIS — E1129 Type 2 diabetes mellitus with other diabetic kidney complication: Secondary | ICD-10-CM | POA: Diagnosis not present

## 2014-03-27 DIAGNOSIS — N2581 Secondary hyperparathyroidism of renal origin: Secondary | ICD-10-CM | POA: Diagnosis not present

## 2014-03-27 DIAGNOSIS — N186 End stage renal disease: Secondary | ICD-10-CM | POA: Diagnosis not present

## 2014-03-27 DIAGNOSIS — Z992 Dependence on renal dialysis: Secondary | ICD-10-CM | POA: Diagnosis not present

## 2014-03-28 DIAGNOSIS — N186 End stage renal disease: Secondary | ICD-10-CM | POA: Diagnosis not present

## 2014-03-28 DIAGNOSIS — Z992 Dependence on renal dialysis: Secondary | ICD-10-CM | POA: Diagnosis not present

## 2014-03-30 DIAGNOSIS — N2581 Secondary hyperparathyroidism of renal origin: Secondary | ICD-10-CM | POA: Diagnosis not present

## 2014-03-30 DIAGNOSIS — E1129 Type 2 diabetes mellitus with other diabetic kidney complication: Secondary | ICD-10-CM | POA: Diagnosis not present

## 2014-03-30 DIAGNOSIS — N186 End stage renal disease: Secondary | ICD-10-CM | POA: Diagnosis not present

## 2014-04-01 DIAGNOSIS — N2581 Secondary hyperparathyroidism of renal origin: Secondary | ICD-10-CM | POA: Diagnosis not present

## 2014-04-01 DIAGNOSIS — N186 End stage renal disease: Secondary | ICD-10-CM | POA: Diagnosis not present

## 2014-04-01 DIAGNOSIS — E1129 Type 2 diabetes mellitus with other diabetic kidney complication: Secondary | ICD-10-CM | POA: Diagnosis not present

## 2014-04-03 DIAGNOSIS — N186 End stage renal disease: Secondary | ICD-10-CM | POA: Diagnosis not present

## 2014-04-03 DIAGNOSIS — E1129 Type 2 diabetes mellitus with other diabetic kidney complication: Secondary | ICD-10-CM | POA: Diagnosis not present

## 2014-04-03 DIAGNOSIS — N2581 Secondary hyperparathyroidism of renal origin: Secondary | ICD-10-CM | POA: Diagnosis not present

## 2014-04-05 ENCOUNTER — Encounter: Payer: Self-pay | Admitting: Endocrinology

## 2014-04-05 ENCOUNTER — Ambulatory Visit (INDEPENDENT_AMBULATORY_CARE_PROVIDER_SITE_OTHER): Payer: Medicare Other | Admitting: Endocrinology

## 2014-04-05 ENCOUNTER — Telehealth: Payer: Self-pay | Admitting: Endocrinology

## 2014-04-05 VITALS — BP 136/84 | HR 96 | Temp 98.4°F | Ht 62.0 in | Wt 172.0 lb

## 2014-04-05 DIAGNOSIS — E1029 Type 1 diabetes mellitus with other diabetic kidney complication: Secondary | ICD-10-CM | POA: Diagnosis not present

## 2014-04-05 MED ORDER — INSULIN LISPRO 100 UNIT/ML (KWIKPEN): 10.0000 [IU] | PEN_INJECTOR | Freq: Three times a day (TID) | SUBCUTANEOUS | Status: DC

## 2014-04-05 MED ORDER — GLUCOSE BLOOD VI STRP: 1.0000 | ORAL_STRIP | Freq: Two times a day (BID) | Status: DC

## 2014-04-05 NOTE — Telephone Encounter (Signed)
Lvom advising of note below. Advised pt to call back if she had any questions.

## 2014-04-05 NOTE — Telephone Encounter (Signed)
i was planning to continue for now, but i'll leave it up to you.

## 2014-04-05 NOTE — Patient Instructions (Addendum)
good diet and exercise habits significanly improve the control of your diabetes.  please let me know if you wish to be referred to a dietician.  high blood sugar is very risky to your health.  you should see an eye doctor and dentist every year.  It is very important to get all recommended vaccinations.  controlling your blood pressure and cholesterol drastically reduces the damage diabetes does to your body.  Those who smoke should quit.  please discuss these with your doctor.  check your blood sugar twice a day.  vary the time of day when you check, between before the 3 meals, and at bedtime.  also check if you have symptoms of your blood sugar being too high or too low.  please keep a record of the readings and bring it to your next appointment here.  You can write it on any piece of paper.  please call us sooner if your blood sugar goes below 70, or if you have a lot of readings over 200. Please change the lantus to humalog 10 units 3 times a day (just before each meal). Please come back for a follow-up appointment in 2 weeks.

## 2014-04-05 NOTE — Telephone Encounter (Signed)
See note below and please advise, Thanks! 

## 2014-04-05 NOTE — Telephone Encounter (Signed)
Patient seen Dr. Loanne Drilling today as new patient  She would like to know if she is still required to take the actos while on insulin   Please advise  Thank You

## 2014-04-05 NOTE — Progress Notes (Signed)
Subjective:    Patient ID: Morgan [name], female    DOB: Dec 11, 1970, 44 y.o.   MRN: [id number]  HPI pt states DM was dx'ed in 1998; she has moderate neuropathy of the lower extremities; she has associated proliferative retinopathy and renal failure; she has been on insulin since dx; pt says her diet is good, but exercise is not good; she has never had GDM, pancreatitis, severe hypoglycemia or DKA.  She does not check cbg's.  Past Medical History  Diagnosis Date  . Diabetes mellitus without complication   . Renal disorder     Past Surgical History  Procedure Laterality Date  . Cesarean section    . Eye surgery      retinal detachment    History   Social History  . Marital Status: Married    Spouse Name: N/A    Number of Children: N/A  . Years of Education: N/A   Occupational History  . Not on file.   Social History Main Topics  . Smoking status: Never Smoker   . Smokeless tobacco: Never Used  . Alcohol Use: No  . Drug Use: Not on file  . Sexual Activity: Not on file   Other Topics Concern  . Not on file   Social History Narrative    Current Outpatient Prescriptions on File Prior to Visit  Medication Sig Dispense Refill  . pioglitazone (ACTOS) 15 MG tablet Take 15 mg by mouth daily.     No current facility-administered medications on file prior to visit.    Allergies  Allergen Reactions  . Iohexol      Code: HIVES, Desc: ITCHING, HIVES, SWELLING- 13 HR PRE-MEDS REQUIRED- ASM, Onset Date: [id number]   . Latex     unknown    Family History  Problem Relation Age of Onset  . Diabetes Maternal Grandmother     BP 136/84 mmHg  Pulse 96  Temp(Src) 98.4 F (36.9 C) (Oral)  Ht _0  (1.575 m)  Wt 172 lb (78.019 kg)  BMI 31.45 kg/m2  SpO2 94%  Review of Systems denies weight loss, headache, chest pain, sob, n/v, muscle cramps, excessive diaphoresis, rhinorrhea, and easy bruising.  No change in chronic vision loss.  She urinates only once per day.   Depression is well-controlled.  She has chronic cold intolerance.    Objective:   Physical Exam VS: see vs page GEN: no distress HEAD: head: no deformity eyes: no periorbital swelling, no proptosis external nose and ears are normal mouth: no lesion seen NECK: supple, thyroid is not enlarged CHEST WALL: no deformity LUNGS:  Clear to auscultation.  CV: reg rate and rhythm, no murmur ABD: abdomen is soft, nontender.  no hepatosplenomegaly.  not distended.  no hernia MUSCULOSKELETAL: muscle bulk and strength are grossly normal.  no obvious joint swelling.  gait is normal and steady EXTEMITIES: no deformity.  no ulcer on the feet.  feet are of normal color and temp.  no edema.  Graft os present at the left upper arm.   PULSES: dorsalis pedis intact bilat.  no carotid bruit NEURO:  cn 2-12 grossly intact.   readily moves all 4's.  sensation is intact to touch on the feet, but decreased from normal. SKIN:  Normal texture and temperature.  No rash or suspicious lesion is visible.   NODES:  None palpable at the neck PSYCH: alert, well-oriented.  Does not appear anxious nor depressed.    outside test results are reviewed: A1c=8.0    Assessment &  Plan:  DM: new to me.   Renal failure, severe: in this context, rapid-acting insulin may be better.  She says she'll take it tid.   Patient is advised the following: Patient Instructions  good diet and exercise habits significanly improve the control of your diabetes.  please let me know if you wish to be referred to a dietician.  high blood sugar is very risky to your health.  you should see an eye doctor and dentist every year.  It is very important to get all recommended vaccinations.  controlling your blood pressure and cholesterol drastically reduces the damage diabetes does to your body.  Those who smoke should quit.  please discuss these with your doctor.  check your blood sugar twice a day.  vary the time of day when you check, between  before the 3 meals, and at bedtime.  also check if you have symptoms of your blood sugar being too high or too low.  please keep a record of the readings and bring it to your next appointment here.  You can write it on any piece of paper.  please call us sooner if your blood sugar goes below 70, or if you have a lot of readings over 200. Please change the lantus to humalog 10 units 3 times a day (just before each meal). Please come back for a follow-up appointment in 2 weeks.

## 2014-04-06 DIAGNOSIS — E1129 Type 2 diabetes mellitus with other diabetic kidney complication: Secondary | ICD-10-CM | POA: Diagnosis not present

## 2014-04-06 DIAGNOSIS — N186 End stage renal disease: Secondary | ICD-10-CM | POA: Diagnosis not present

## 2014-04-06 DIAGNOSIS — N2581 Secondary hyperparathyroidism of renal origin: Secondary | ICD-10-CM | POA: Diagnosis not present

## 2014-04-08 DIAGNOSIS — E1129 Type 2 diabetes mellitus with other diabetic kidney complication: Secondary | ICD-10-CM | POA: Diagnosis not present

## 2014-04-08 DIAGNOSIS — N186 End stage renal disease: Secondary | ICD-10-CM | POA: Diagnosis not present

## 2014-04-08 DIAGNOSIS — N2581 Secondary hyperparathyroidism of renal origin: Secondary | ICD-10-CM | POA: Diagnosis not present

## 2014-04-09 DIAGNOSIS — I871 Compression of vein: Secondary | ICD-10-CM | POA: Diagnosis not present

## 2014-04-09 DIAGNOSIS — Z992 Dependence on renal dialysis: Secondary | ICD-10-CM | POA: Diagnosis not present

## 2014-04-09 DIAGNOSIS — N186 End stage renal disease: Secondary | ICD-10-CM | POA: Diagnosis not present

## 2014-04-09 DIAGNOSIS — T82858D Stenosis of vascular prosthetic devices, implants and grafts, subsequent encounter: Secondary | ICD-10-CM | POA: Diagnosis not present

## 2014-04-10 DIAGNOSIS — N186 End stage renal disease: Secondary | ICD-10-CM | POA: Diagnosis not present

## 2014-04-10 DIAGNOSIS — E1129 Type 2 diabetes mellitus with other diabetic kidney complication: Secondary | ICD-10-CM | POA: Diagnosis not present

## 2014-04-10 DIAGNOSIS — N2581 Secondary hyperparathyroidism of renal origin: Secondary | ICD-10-CM | POA: Diagnosis not present

## 2014-04-13 DIAGNOSIS — N2581 Secondary hyperparathyroidism of renal origin: Secondary | ICD-10-CM | POA: Diagnosis not present

## 2014-04-13 DIAGNOSIS — N186 End stage renal disease: Secondary | ICD-10-CM | POA: Diagnosis not present

## 2014-04-13 DIAGNOSIS — E1129 Type 2 diabetes mellitus with other diabetic kidney complication: Secondary | ICD-10-CM | POA: Diagnosis not present

## 2014-04-15 DIAGNOSIS — E1129 Type 2 diabetes mellitus with other diabetic kidney complication: Secondary | ICD-10-CM | POA: Diagnosis not present

## 2014-04-15 DIAGNOSIS — N186 End stage renal disease: Secondary | ICD-10-CM | POA: Diagnosis not present

## 2014-04-15 DIAGNOSIS — N2581 Secondary hyperparathyroidism of renal origin: Secondary | ICD-10-CM | POA: Diagnosis not present

## 2014-04-16 DIAGNOSIS — F4323 Adjustment disorder with mixed anxiety and depressed mood: Secondary | ICD-10-CM | POA: Diagnosis not present

## 2014-04-17 DIAGNOSIS — N2581 Secondary hyperparathyroidism of renal origin: Secondary | ICD-10-CM | POA: Diagnosis not present

## 2014-04-17 DIAGNOSIS — E1129 Type 2 diabetes mellitus with other diabetic kidney complication: Secondary | ICD-10-CM | POA: Diagnosis not present

## 2014-04-17 DIAGNOSIS — N186 End stage renal disease: Secondary | ICD-10-CM | POA: Diagnosis not present

## 2014-04-19 ENCOUNTER — Encounter: Payer: Self-pay | Admitting: Endocrinology

## 2014-04-19 ENCOUNTER — Ambulatory Visit (INDEPENDENT_AMBULATORY_CARE_PROVIDER_SITE_OTHER): Payer: Medicare Other | Admitting: Endocrinology

## 2014-04-19 VITALS — BP 144/103 | HR 99 | Temp 98.5°F | Ht 62.0 in | Wt 173.0 lb

## 2014-04-19 DIAGNOSIS — E1029 Type 1 diabetes mellitus with other diabetic kidney complication: Secondary | ICD-10-CM

## 2014-04-19 NOTE — Progress Notes (Signed)
Subjective:    Patient ID: Morgan [name], female    DOB: 1970/06/14, 44 y.o.   MRN: [id number]  HPI  Pt returns for f/u of diabetes mellitus: DM type: Insulin-requiring type 2 Dx'ed: 2376 Complications: polyneuropathy, proliferative retinopathy, and ESRD. Therapy: insulin since dx GDM: never DKA: never Severe hypoglycemia: never Pancreatitis: never Other: she takes pioglitizone (which she would like to continue), and multiple daily injections Interval history: Pt says she takes humalog 1-2 times per day.  She says her meals are extremely irregular.  She has mild hypoglycemia approx once per week.  This happens in the fasting state.  no cbg record, but states cbg's vary from 72-180.  It is in general higher as the day goes on Past Medical History  Diagnosis Date  . Diabetes mellitus without complication   . Renal disorder     Past Surgical History  Procedure Laterality Date  . Cesarean section    . Eye surgery      retinal detachment    History   Social History  . Marital Status: Married    Spouse Name: N/A  . Number of Children: N/A  . Years of Education: N/A   Occupational History  . Not on file.   Social History Main Topics  . Smoking status: Never Smoker   . Smokeless tobacco: Never Used  . Alcohol Use: No  . Drug Use: Not on file  . Sexual Activity: Not on file   Other Topics Concern  . Not on file   Social History Narrative    Current Outpatient Prescriptions on File Prior to Visit  Medication Sig Dispense Refill  . glucose blood (ONE TOUCH ULTRA TEST) test strip 1 each by Other route 2 (two) times daily. And lancets 2/day 100 each 12  . insulin lispro (HUMALOG KWIKPEN) 100 UNIT/ML KiwkPen Inject 0.1 mLs (10 Units total) into the skin 3 (three) times daily with meals. And pen needles 3/day 15 mL 11  . pioglitazone (ACTOS) 15 MG tablet Take 15 mg by mouth daily.     No current facility-administered medications on file prior to visit.    Allergies    Allergen Reactions  . Iohexol      Code: HIVES, Desc: ITCHING, HIVES, SWELLING- 13 HR PRE-MEDS REQUIRED- ASM, Onset Date: [id number]   . Latex     unknown    Family History  Problem Relation Age of Onset  . Diabetes Maternal Grandmother     BP 144/103 mmHg  Pulse 99  Temp(Src) 98.5 F (36.9 C) (Oral)  Ht 5' 2" (1.575 m)  Wt 173 lb (78.472 kg)  BMI 31.63 kg/m2  SpO2 96%    Review of Systems Denies LOC and weight change    Objective:   Physical Exam VITAL SIGNS:  See vs page GENERAL: no distress Pulses: dorsalis pedis intact bilat.   MSK: no deformity of the feet CV: no leg edema.  Skin:  no ulcer on the feet.  normal color and temp on the feet. Neuro: sensation is intact to touch on the feet.        Assessment & Plan:  DM: mild exacerbation Noncompliance with cbg recording and insulin dosing: we discussed compliance.   Pt say she will work on this.  The pattern of cbg's says it is in general higher as the day goes on, so intermediate or long-acting insulin is much less preferred.     Patient is advised the following: Patient Instructions  check your blood sugar  twice a day.  vary the time of day when you check, between before the 3 meals, and at bedtime.  also check if you have symptoms of your blood sugar being too high or too low.  please keep a record of the readings and bring it to your next appointment here.  You can write it on any piece of paper.  please call us sooner if your blood sugar goes below 70, or if you have a lot of readings over 200. Please continue the humalog, 10 units 3 times a day (just before each meal).  It is important to never miss this. Please come back for a follow-up appointment in 2 months.

## 2014-04-19 NOTE — Patient Instructions (Addendum)
check your blood sugar twice a day.  vary the time of day when you check, between before the 3 meals, and at bedtime.  also check if you have symptoms of your blood sugar being too high or too low.  please keep a record of the readings and bring it to your next appointment here.  You can write it on any piece of paper.  please call us sooner if your blood sugar goes below 70, or if you have a lot of readings over 200. Please continue the humalog, 10 units 3 times a day (just before each meal).  It is important to never miss this. Please come back for a follow-up appointment in 2 months.

## 2014-04-20 DIAGNOSIS — N186 End stage renal disease: Secondary | ICD-10-CM | POA: Diagnosis not present

## 2014-04-20 DIAGNOSIS — N2581 Secondary hyperparathyroidism of renal origin: Secondary | ICD-10-CM | POA: Diagnosis not present

## 2014-04-20 DIAGNOSIS — E1129 Type 2 diabetes mellitus with other diabetic kidney complication: Secondary | ICD-10-CM | POA: Diagnosis not present

## 2014-04-22 DIAGNOSIS — N2581 Secondary hyperparathyroidism of renal origin: Secondary | ICD-10-CM | POA: Diagnosis not present

## 2014-04-22 DIAGNOSIS — N186 End stage renal disease: Secondary | ICD-10-CM | POA: Diagnosis not present

## 2014-04-22 DIAGNOSIS — E1129 Type 2 diabetes mellitus with other diabetic kidney complication: Secondary | ICD-10-CM | POA: Diagnosis not present

## 2014-04-23 DIAGNOSIS — F4323 Adjustment disorder with mixed anxiety and depressed mood: Secondary | ICD-10-CM | POA: Diagnosis not present

## 2014-04-24 DIAGNOSIS — N2581 Secondary hyperparathyroidism of renal origin: Secondary | ICD-10-CM | POA: Diagnosis not present

## 2014-04-24 DIAGNOSIS — N186 End stage renal disease: Secondary | ICD-10-CM | POA: Diagnosis not present

## 2014-04-24 DIAGNOSIS — E1129 Type 2 diabetes mellitus with other diabetic kidney complication: Secondary | ICD-10-CM | POA: Diagnosis not present

## 2014-04-26 DIAGNOSIS — Z992 Dependence on renal dialysis: Secondary | ICD-10-CM | POA: Diagnosis not present

## 2014-04-26 DIAGNOSIS — N186 End stage renal disease: Secondary | ICD-10-CM | POA: Diagnosis not present

## 2014-04-27 DIAGNOSIS — E1129 Type 2 diabetes mellitus with other diabetic kidney complication: Secondary | ICD-10-CM | POA: Diagnosis not present

## 2014-04-27 DIAGNOSIS — N2581 Secondary hyperparathyroidism of renal origin: Secondary | ICD-10-CM | POA: Diagnosis not present

## 2014-04-27 DIAGNOSIS — N186 End stage renal disease: Secondary | ICD-10-CM | POA: Diagnosis not present

## 2014-04-30 DIAGNOSIS — F4323 Adjustment disorder with mixed anxiety and depressed mood: Secondary | ICD-10-CM | POA: Diagnosis not present

## 2014-05-14 DIAGNOSIS — F4323 Adjustment disorder with mixed anxiety and depressed mood: Secondary | ICD-10-CM | POA: Diagnosis not present

## 2014-05-27 DIAGNOSIS — E1129 Type 2 diabetes mellitus with other diabetic kidney complication: Secondary | ICD-10-CM | POA: Diagnosis not present

## 2014-05-27 DIAGNOSIS — Z992 Dependence on renal dialysis: Secondary | ICD-10-CM | POA: Diagnosis not present

## 2014-05-27 DIAGNOSIS — N186 End stage renal disease: Secondary | ICD-10-CM | POA: Diagnosis not present

## 2014-05-28 DIAGNOSIS — F4323 Adjustment disorder with mixed anxiety and depressed mood: Secondary | ICD-10-CM | POA: Diagnosis not present

## 2014-05-29 DIAGNOSIS — D509 Iron deficiency anemia, unspecified: Secondary | ICD-10-CM | POA: Diagnosis not present

## 2014-05-29 DIAGNOSIS — N186 End stage renal disease: Secondary | ICD-10-CM | POA: Diagnosis not present

## 2014-05-29 DIAGNOSIS — N2581 Secondary hyperparathyroidism of renal origin: Secondary | ICD-10-CM | POA: Diagnosis not present

## 2014-05-29 DIAGNOSIS — D631 Anemia in chronic kidney disease: Secondary | ICD-10-CM | POA: Diagnosis not present

## 2014-06-01 DIAGNOSIS — F4323 Adjustment disorder with mixed anxiety and depressed mood: Secondary | ICD-10-CM | POA: Diagnosis not present

## 2014-06-05 ENCOUNTER — Encounter (HOSPITAL_COMMUNITY): Payer: Self-pay | Admitting: *Deleted

## 2014-06-05 ENCOUNTER — Observation Stay (HOSPITAL_COMMUNITY)
Admission: EM | Admit: 2014-06-05 | Discharge: 2014-06-06 | Disposition: A | Discharge status: Home / Self Care | Payer: Medicare Other | Attending: Internal Medicine | Admitting: Internal Medicine

## 2014-06-05 ENCOUNTER — Emergency Department (HOSPITAL_COMMUNITY): Payer: Medicare Other

## 2014-06-05 DIAGNOSIS — R079 Chest pain, unspecified: Secondary | ICD-10-CM | POA: Diagnosis not present

## 2014-06-05 DIAGNOSIS — E119 Type 2 diabetes mellitus without complications: Secondary | ICD-10-CM | POA: Insufficient documentation

## 2014-06-05 DIAGNOSIS — R0602 Shortness of breath: Secondary | ICD-10-CM | POA: Insufficient documentation

## 2014-06-05 DIAGNOSIS — R0789 Other chest pain: Secondary | ICD-10-CM | POA: Diagnosis not present

## 2014-06-05 DIAGNOSIS — Z79899 Other long term (current) drug therapy: Secondary | ICD-10-CM | POA: Insufficient documentation

## 2014-06-05 DIAGNOSIS — Z794 Long term (current) use of insulin: Secondary | ICD-10-CM | POA: Diagnosis not present

## 2014-06-05 DIAGNOSIS — N186 End stage renal disease: Secondary | ICD-10-CM

## 2014-06-05 DIAGNOSIS — Z992 Dependence on renal dialysis: Secondary | ICD-10-CM | POA: Diagnosis not present

## 2014-06-05 DIAGNOSIS — E1029 Type 1 diabetes mellitus with other diabetic kidney complication: Secondary | ICD-10-CM

## 2014-06-05 DIAGNOSIS — I120 Hypertensive chronic kidney disease with stage 5 chronic kidney disease or end stage renal disease: Secondary | ICD-10-CM | POA: Diagnosis not present

## 2014-06-05 DIAGNOSIS — Z91040 Latex allergy status: Secondary | ICD-10-CM | POA: Insufficient documentation

## 2014-06-05 DIAGNOSIS — F339 Major depressive disorder, recurrent, unspecified: Secondary | ICD-10-CM | POA: Diagnosis present

## 2014-06-05 HISTORY — DX: End stage renal disease: Z99.2

## 2014-06-05 HISTORY — DX: End stage renal disease: N18.6

## 2014-06-05 HISTORY — DX: Essential (primary) hypertension: I10

## 2014-06-05 LAB — BASIC METABOLIC PANEL
Anion gap: 10 (ref 5–15)
BUN: 17 mg/dL (ref 6–23)
CO2: 34 mmol/L — ABNORMAL HIGH (ref 19–32)
CREATININE: 5.08 mg/dL — AB (ref 0.50–1.10)
Calcium: 8.7 mg/dL (ref 8.4–10.5)
Chloride: 95 mmol/L — ABNORMAL LOW (ref 96–112)
GFR, EST AFRICAN AMERICAN: 11 mL/min — AB (ref >90)
GFR, EST NON AFRICAN AMERICAN: 9 mL/min — AB (ref >90)
Glucose, Bld: 209 mg/dL — ABNORMAL HIGH (ref 70–99)
Potassium: 4.1 mmol/L (ref 3.5–5.1)
Sodium: 139 mmol/L (ref 135–145)

## 2014-06-05 LAB — CBC
HCT: 41.5 % (ref 36.0–46.0)
Hemoglobin: 13.8 g/dL (ref 12.0–15.0)
MCH: 30.3 pg (ref 26.0–34.0)
MCHC: 33.3 g/dL (ref 30.0–36.0)
MCV: 91 fL (ref 78.0–100.0)
Platelets: 301 10*3/uL (ref 150–400)
RBC: 4.56 MIL/uL (ref 3.87–5.11)
RDW: 12.8 % (ref 11.5–15.5)
WBC: 6.7 10*3/uL (ref 4.0–10.5)

## 2014-06-05 LAB — TROPONIN I: Troponin I: <0.03 ng/mL (ref <0.031)

## 2014-06-05 LAB — GLUCOSE, CAPILLARY
GLUCOSE-CAPILLARY: 146 mg/dL — AB (ref 70–99)
Glucose-Capillary: 173 mg/dL — ABNORMAL HIGH (ref 70–99)

## 2014-06-05 MED ORDER — INSULIN ASPART 100 UNIT/ML ~~LOC~~ SOLN
6.0000 [IU] | Freq: Three times a day (TID) | SUBCUTANEOUS | Status: DC
  Administered 2014-06-05 – 2014-06-06 (×2): 6 [IU] via SUBCUTANEOUS

## 2014-06-05 MED ORDER — CINACALCET HCL 30 MG PO TABS
60.0000 mg | ORAL_TABLET | Freq: Every day | ORAL | Status: DC
  Administered 2014-06-06: 60 mg via ORAL
  Filled 2014-06-05 (×2): qty 2

## 2014-06-05 MED ORDER — ASPIRIN EC 325 MG PO TBEC
325.0000 mg | DELAYED_RELEASE_TABLET | Freq: Every day | ORAL | Status: DC
  Administered 2014-06-06: 325 mg via ORAL
  Filled 2014-06-05: qty 1

## 2014-06-05 MED ORDER — INSULIN ASPART 100 UNIT/ML ~~LOC~~ SOLN
0.0000 [IU] | Freq: Three times a day (TID) | SUBCUTANEOUS | Status: DC
  Administered 2014-06-06: 3 [IU] via SUBCUTANEOUS

## 2014-06-05 MED ORDER — ONDANSETRON HCL 4 MG/2ML IJ SOLN: 4.0000 mg | Freq: Four times a day (QID) | INTRAMUSCULAR | Status: DC | PRN

## 2014-06-05 MED ORDER — HEPARIN SODIUM (PORCINE) 5000 UNIT/ML IJ SOLN
5000.0000 [IU] | Freq: Three times a day (TID) | INTRAMUSCULAR | Status: DC
  Administered 2014-06-05: 5000 [IU] via SUBCUTANEOUS
  Filled 2014-06-05: qty 1

## 2014-06-05 MED ORDER — CALCIUM ACETATE (PHOS BINDER) 667 MG PO CAPS
667.0000 mg | ORAL_CAPSULE | Freq: Three times a day (TID) | ORAL | Status: DC
  Administered 2014-06-05 – 2014-06-06 (×2): 667 mg via ORAL
  Filled 2014-06-05 (×5): qty 1

## 2014-06-05 MED ORDER — METOPROLOL TARTRATE 12.5 MG HALF TABLET
12.5000 mg | ORAL_TABLET | Freq: Two times a day (BID) | ORAL | Status: DC
Start: 2014-06-05 — End: 2014-06-06
  Administered 2014-06-05: 12.5 mg via ORAL
  Filled 2014-06-05: qty 1

## 2014-06-05 MED ORDER — INSULIN ASPART 100 UNIT/ML ~~LOC~~ SOLN: 0.0000 [IU] | Freq: Every day | SUBCUTANEOUS | Status: DC

## 2014-06-05 MED ORDER — SEVELAMER CARBONATE 800 MG PO TABS: 4000.0000 mg | ORAL_TABLET | Freq: Three times a day (TID) | ORAL | Status: DC

## 2014-06-05 MED ORDER — SEVELAMER CARBONATE 800 MG PO TABS
4000.0000 mg | ORAL_TABLET | Freq: Three times a day (TID) | ORAL | Status: DC
  Administered 2014-06-05 – 2014-06-06 (×2): 4000 mg via ORAL
  Filled 2014-06-05 (×2): qty 5

## 2014-06-05 MED ORDER — ACETAMINOPHEN 325 MG PO TABS: 650.0000 mg | ORAL_TABLET | ORAL | Status: DC | PRN

## 2014-06-05 MED ORDER — PIOGLITAZONE HCL 15 MG PO TABS
15.0000 mg | ORAL_TABLET | Freq: Every day | ORAL | Status: DC
Start: 2014-06-06 — End: 2014-06-06
  Administered 2014-06-06: 15 mg via ORAL
  Filled 2014-06-05: qty 1

## 2014-06-05 MED ORDER — NITROGLYCERIN 0.4 MG SL SUBL: 0.4000 mg | SUBLINGUAL_TABLET | SUBLINGUAL | Status: DC | PRN

## 2014-06-05 MED ORDER — RENA-VITE PO TABS
1.0000 | ORAL_TABLET | Freq: Every day | ORAL | Status: DC
  Administered 2014-06-06: 1 via ORAL
  Filled 2014-06-05 (×2): qty 1

## 2014-06-05 MED ORDER — ALBUTEROL SULFATE (2.5 MG/3ML) 0.083% IN NEBU: 3.0000 mL | INHALATION_SOLUTION | Freq: Four times a day (QID) | RESPIRATORY_TRACT | Status: DC | PRN

## 2014-06-05 NOTE — ED Provider Notes (Signed)
CSN: [id number]     Arrival date & time 06/05/14  1312 History   First MD Initiated Contact with Patient 06/05/14 1314     Chief Complaint  Patient presents with  . Chest Pain     (Consider location/radiation/quality/duration/timing/severity/associated sxs/prior Treatment) HPI Comments: Patient is a 44 year old female past medical history significant for DM, ESRD on dialysis TTS presenting to the ED for evaluation of chest heaviness that occurred while at dialysis. Patient states that approximately 1140 she developed severe central chest heaviness with associated shortness of breath, last approximately 20-25 minutes and resolved without recurrence. She states she was completely finished with dialysis at this time. Denies any missed dialysis sessions. Denies any history of similar chest pain. No history of echocardiogram, cardiac catheterization or stress test. Received 324mg  ASA via EMS.   Patient is a 44 y.o. female presenting with chest pain.  Chest Pain   Past Medical History  Diagnosis Date  . Diabetes mellitus without complication   . Renal disorder   . Hypertension    Past Surgical History  Procedure Laterality Date  . Cesarean section    . Eye surgery      retinal detachment   Family History  Problem Relation Age of Onset  . Diabetes Maternal Grandmother    History  Substance Use Topics  . Smoking status: Never Smoker   . Smokeless tobacco: Never Used  . Alcohol Use: No   OB History    No data available     Review of Systems  Cardiovascular: Positive for chest pain.  All other systems reviewed and are negative.     Allergies  Iohexol and Latex  Home Medications   Prior to Admission medications   Medication Sig Start Date End Date Taking? Authorizing Provider  albuterol (PROVENTIL HFA;VENTOLIN HFA) 108 (90 BASE) MCG/ACT inhaler Inhale 1-2 puffs into the lungs every 6 (six) hours as needed for wheezing or shortness of breath.   Yes Historical Provider, MD    B Complex-C-Folic Acid (DIALYVITE TABLET) TABS Take 1 tablet by mouth daily at 12 noon.   Yes Historical Provider, MD  calcium acetate (PHOSLO) 667 MG capsule Take 667 mg by mouth 3 (three) times daily with meals.    Yes Historical Provider, MD  cinacalcet (SENSIPAR) 60 MG tablet Take 60 mg by mouth daily.   Yes Historical Provider, MD  glucose blood (ONE TOUCH ULTRA TEST) test strip 1 each by Other route 2 (two) times daily. And lancets 2/day 04/05/14  Yes Renato Shin, MD  insulin lispro (HUMALOG KWIKPEN) 100 UNIT/ML KiwkPen Inject 0.1 mLs (10 Units total) into the skin 3 (three) times daily with meals. And pen needles 3/day 04/05/14  Yes Renato Shin, MD  levonorgestrel (MIRENA) 20 MCG/24HR IUD 1 each by Intrauterine route once.   Yes Historical Provider, MD  lidocaine-prilocaine (EMLA) cream Apply 1 application topically daily as needed (dialysis).    Yes Historical Provider, MD  pioglitazone (ACTOS) 15 MG tablet Take 15 mg by mouth daily.   Yes Historical Provider, MD  sevelamer carbonate (RENVELA) 800 MG tablet Take 4,000-4,800 mg by mouth 3 (three) times daily with meals.    Yes Historical Provider, MD   BP 162/91 mmHg  Pulse 88  Temp(Src) 97.8 F (36.6 C) (Oral)  Resp 14  SpO2 97% Physical Exam  Constitutional: She is oriented to person, place, and time. She appears well-developed and well-nourished.  HENT:  Head: Normocephalic and atraumatic.  Right Ear: External ear normal.  Left Ear: External  ear normal.  Nose: Nose normal.  Eyes: Conjunctivae are normal.  Neck: Neck supple.  Cardiovascular: Normal rate, regular rhythm and normal heart sounds.   Pulmonary/Chest: Effort normal and breath sounds normal. No respiratory distress.  Abdominal: Soft. There is no tenderness.  Musculoskeletal: Normal range of motion. She exhibits no edema.  Neurological: She is alert and oriented to person, place, and time.  Skin: Skin is warm and dry.     Nursing note and vitals reviewed.   ED  Course  Procedures (including critical care time) Medications - No data to display  Labs Review Labs Reviewed  BASIC METABOLIC PANEL - Abnormal; Notable for the following:    Chloride 95 (*)    CO2 34 (*)    Glucose, Bld 209 (*)    Creatinine, Ser 5.08 (*)    GFR calc non Af Amer 9 (*)    GFR calc Af Amer 11 (*)    All other components within normal limits  CBC  TROPONIN I    Imaging Review Dg Chest 2 View  06/05/2014   CLINICAL DATA:  Chest pain.  EXAM: CHEST  2 VIEW  COMPARISON:  August 26, 2013.  FINDINGS: The heart size and mediastinal contours are within normal limits. Both lungs are clear. No pneumothorax or pleural effusion is noted. The visualized skeletal structures are unremarkable.  IMPRESSION: No active cardiopulmonary disease.   Electronically Signed   By: Marijo Conception, M.D.   On: 06/05/2014 14:11     EKG Interpretation   Date/Time:  Saturday June 05 2014 13:20:38 EDT Ventricular Rate:  89 PR Interval:  132 QRS Duration: 73 QT Interval:  408 QTC Calculation: 496 R Axis:   -7 Text Interpretation:  Sinus rhythm Borderline T wave abnormalities  Borderline prolonged QT interval diffuse T wave flattening Confirmed by  Wyvonnia Dusky  MD, STEPHEN [phone]) on 06/05/2014 1:39:04 PM      MDM   Final diagnoses:  Chest pain, unspecified chest pain type    Filed Vitals:   06/05/14 1630  BP: 162/91  Pulse: 88  Temp:   Resp: 14   Afebrile, NAD, non-toxic appearing, AAOx4.  I have reviewed nursing notes, vital signs, and all appropriate lab and imaging results for this patient.  Concern for cardiac etiology of Chest Pain. Hospitalist has been consulted and will see patient in the ED for likely admit. Pt does not meet criteria for CP protocol and a further evaluation is recommended. Pt has been re-evaluated prior to consult and VSS, NAD, heart RRR, pain 0/10, lungs CTAB. No acute abnormalities found on EKG and first round of cardiac enzymes negative. This case was discussed  with Dr. Wyvonnia Dusky who has seen the patient and agrees with plan to admit.      Baron Sane, PA-C 06/05/14 [place], MD 06/05/14 [phone]

## 2014-06-05 NOTE — H&P (Addendum)
[facility] History and Physical  DORCUS RIGA FHL:[id number] DOB: March 09, 1970 DOA: 06/05/2014  Referring physician: ED PCP: [name], NP   Primary nephrologist: Jamal Maes, MD  Chief Complaint:  Chest pain since one day  HPI:  [name] with history of end-stage renal disease on dialysis, diabetes mellitus on Actos and insulin, major depression who was at dialysis today when she had chest heaviness. Patient reports she was about to complete her dialysis when she developed chest heaviness with feeling of someone sitting on her chest, 4/10 in severe T and lasting for almost 25 minutes. Pain was nonradiating and without any aggravating or relieving factors. Denies similar pain symptoms in the past. Denies shortness of breath, orthopnea, PND or palpitations. She reported that her dialysis was completed and EMS was called. She received a full dose aspirin in route chest pain subsided. She had a normal stress echo in 2013 done at [facility] prior to being evaluated for renal transplant. Patient denies headache, dizziness, fever, chills, nausea , vomiting,  palpitations, SOB, abdominal pain, bowel or urinary symptoms. Denies change in weight or appetite. Reports making urine twice a day.  Course in the ED Patient's vitals were stable except for mildly elevated blood pressure. Blood work showed normal CBC, chemistry showed sodium 139, K of 4.1, chloride 95, CO2 34, BUN 17 and creatinine 5.08. Initial troponin was negative. Glucose of 209. Chest x-ray was unremarkable. EKG showed normal sinus rhythm at 89 with T-wave flattening in lateral leads which is new compared to last EKG from 2014. Hospitalist admission requested to telemetry.  Review of Systems:  Constitutional: Denies fever, chills, diaphoresis, appetite change and fatigue.  HEENT: Denies sore hearing symptoms, difficult swallowing, congestion, neck pain or stiffness Respiratory: Denies SOB, DOE,  cough,   and wheezing.   Cardiovascular: Chest pain++, denies palpitations and leg swelling.  Gastrointestinal: Denies nausea, vomiting, abdominal pain, diarrhea,  blood in stool and abdominal distention.  Genitourinary: Denies dysuria,  hematuria, flank pain and difficulty urinating.  Endocrine: Denies hot or cold intolerance, polyuria, polydipsia  Musculoskeletal: Denies myalgias, back pain, joint pain or swelling  Skin: Denies  rash and wound.  Neurological: Denies dizziness, syncope, weakness, light-headedness, numbness and headaches.  Hematological: Denies adenopathy.  Psychiatric/Behavioral: Denies mood changes, confusion  Past Medical History  Diagnosis Date  . Diabetes mellitus without complication   . Renal disorder   . Hypertension    Past Surgical History  Procedure Laterality Date  . Cesarean section    . Eye surgery      retinal detachment   Social History:  reports that she has never smoked. She has never used smokeless tobacco. She reports that she does not drink alcohol. Her drug history is not on file.  Allergies  Allergen Reactions  . Iohexol      Code: HIVES, Desc: ITCHING, HIVES, SWELLING- 13 HR PRE-MEDS REQUIRED- ASM, Onset Date: [id number]   . Latex     unknown    Family History  Problem Relation Age of Onset  . Diabetes Maternal Grandmother    grandmother had angina. Grandfather died of heart attack.  Prior to Admission medications   Medication Sig Start Date End Date Taking? Authorizing Provider  albuterol (PROVENTIL HFA;VENTOLIN HFA) 108 (90 BASE) MCG/ACT inhaler Inhale 1-2 puffs into the lungs every 6 (six) hours as needed for wheezing or shortness of breath.   Yes Historical Provider, MD  B Complex-C-Folic Acid (DIALYVITE TABLET) TABS Take 1 tablet by mouth daily at 12 noon.  Yes Historical Provider, MD  calcium acetate (PHOSLO) 667 MG capsule Take 667 mg by mouth 3 (three) times daily with meals.    Yes Historical Provider, MD  cinacalcet  (SENSIPAR) 60 MG tablet Take 60 mg by mouth daily.   Yes Historical Provider, MD  glucose blood (ONE TOUCH ULTRA TEST) test strip 1 each by Other route 2 (two) times daily. And lancets 2/day 04/05/14  Yes Renato Shin, MD  insulin lispro (HUMALOG KWIKPEN) 100 UNIT/ML KiwkPen Inject 0.1 mLs (10 Units total) into the skin 3 (three) times daily with meals. And pen needles 3/day 04/05/14  Yes Renato Shin, MD  levonorgestrel (MIRENA) 20 MCG/24HR IUD 1 each by Intrauterine route once.   Yes Historical Provider, MD  lidocaine-prilocaine (EMLA) cream Apply 1 application topically daily as needed (dialysis).    Yes Historical Provider, MD  pioglitazone (ACTOS) 15 MG tablet Take 15 mg by mouth daily.   Yes Historical Provider, MD  sevelamer carbonate (RENVELA) 800 MG tablet Take 4,000-4,800 mg by mouth 3 (three) times daily with meals.    Yes Historical Provider, MD     Physical Exam:  Filed Vitals:   06/05/14 1323 06/05/14 1552 06/05/14 1615 06/05/14 1630  BP: 130/68 153/74 164/75 162/91  Pulse: 95 88 85 88  Temp: 97.8 F (36.6 C)     TempSrc: Oral     Resp: '14 13 10 14  ' SpO2: 98% 99% 97% 97%    Constitutional: Vital signs reviewed.  Middle aged female in no acute distress HEENT: no pallor, no icterus, moist oral mucosa, no cervical lymphadenopathy Cardiovascular: RRR, S1 normal, S2 normal, no MRG Chest: CTAB, no wheezes, rales, or rhonchi GI: Soft. Non-tender, non-distended, bowel sounds are normal,   musculoskeletal: warm, no edema, left upper extremities AV graft  Neurological: Alert and oriented  Labs on Admission:  Basic Metabolic Panel:  Recent Labs Lab 06/05/14 1412  NA 139  K 4.1  CL 95*  CO2 34*  GLUCOSE 209*  BUN 17  CREATININE 5.08*  CALCIUM 8.7   Liver Function Tests: No results for input(s): AST, ALT, ALKPHOS, BILITOT, PROT, ALBUMIN in the last 168 hours. No results for input(s): LIPASE, AMYLASE in the last 168 hours. No results for input(s): AMMONIA in the last 168  hours. CBC:  Recent Labs Lab 06/05/14 1412  WBC 6.7  HGB 13.8  HCT 41.5  MCV 91.0  PLT 301   Cardiac Enzymes:  Recent Labs Lab 06/05/14 1412  TROPONINI <0.03   BNP: Invalid input(s): POCBNP CBG: No results for input(s): GLUCAP in the last 168 hours.  Radiological Exams on Admission: Dg Chest 2 View  06/05/2014   CLINICAL DATA:  Chest pain.  EXAM: CHEST  2 VIEW  COMPARISON:  August 26, 2013.  FINDINGS: The heart size and mediastinal contours are within normal limits. Both lungs are clear. No pneumothorax or pleural effusion is noted. The visualized skeletal structures are unremarkable.  IMPRESSION: No active cardiopulmonary disease.   Electronically Signed   By: Marijo Conception, M.D.   On: 06/05/2014 14:11    EKG: Normal sinus rhythm with T-wave flattening in lateral leads. This is new compared to last EKG 2 years back.  Assessment/Plan  Principal Problem:   Chest pain at rest Admit on observation to telemetry. Patient has both typical and atypical features. Her heart score is 4. EKG showing T-wave flattening in lateral leads. Initial troponin negative. Cycle serial cardiac enzymes and EKG. -place on full dose aspirin. Sublingual nitroglycerin when  necessary. -We'll place on low-dose beta blocker. -Spoke with cardiology consult Dr. Aundra Dubin who agrees with ruling out for ACS and if negative, stress test in the morning.  Active Problems:   End-stage renal disease on hemodialysis (Tu, th, sat) Follows with Dr. Lorrene Reid as outpatient. Reports completing her dialysis today. Appears euvolemic. Renal consult notified by ED. Continue calcium acetate, Sensipar and Renvela  Diabetes mellitus Patient on Humalog 8 units 3 times a day with meals. place on NovoLog 6 units 3 times a day with meals along with sliding scale coverage. Continue Actos  Depression Resume home medications  There is a less: Subcutaneous heparin  Diet: Heart healthy/renal  Code Status: full code Family  Communication: discussed with husband at bedside Disposition Plan: admit to [place], [place] [facility] Pager [phone]  Total time spent on admission :55 minutes  If 7PM-7AM, please contact night-coverage [email or web address] Password [facility] 06/05/2014, 4:47 PM

## 2014-06-05 NOTE — ED Notes (Signed)
Pt reports being at dialysis when her chest began to feel heavy during the return process. Heaviness lasted 20-38mins with shortness of breath then resolved after being disconnected. Pt had 4.3kL of fluid removed. Denies shortness of breath or chest heaviness at this time

## 2014-06-05 NOTE — ED Notes (Signed)
Pt to ED from dialysis via [facility] c/o chest pain. Reports chest heaviness started after return of blood while on the machine associated with shortness of breath lastiing 20-25 mins. Chest pain has since resolved. Denies sob at this time.Graft to L arm deaccessed prior to arrival, pressure bandage applied. EMS gave 324mg  [facility] VS 147/93; pulse 93; CBG 210

## 2014-06-06 ENCOUNTER — Observation Stay (HOSPITAL_COMMUNITY): Payer: Medicare Other

## 2014-06-06 ENCOUNTER — Other Ambulatory Visit: Payer: Self-pay

## 2014-06-06 DIAGNOSIS — N186 End stage renal disease: Secondary | ICD-10-CM | POA: Diagnosis not present

## 2014-06-06 DIAGNOSIS — R079 Chest pain, unspecified: Secondary | ICD-10-CM | POA: Diagnosis not present

## 2014-06-06 DIAGNOSIS — Z992 Dependence on renal dialysis: Secondary | ICD-10-CM | POA: Diagnosis not present

## 2014-06-06 DIAGNOSIS — E119 Type 2 diabetes mellitus without complications: Secondary | ICD-10-CM | POA: Diagnosis not present

## 2014-06-06 DIAGNOSIS — I120 Hypertensive chronic kidney disease with stage 5 chronic kidney disease or end stage renal disease: Secondary | ICD-10-CM | POA: Diagnosis not present

## 2014-06-06 LAB — GLUCOSE, CAPILLARY
Glucose-Capillary: 171 mg/dL — ABNORMAL HIGH (ref 70–99)
Glucose-Capillary: 243 mg/dL — ABNORMAL HIGH (ref 70–99)

## 2014-06-06 LAB — TROPONIN I

## 2014-06-06 MED ORDER — TECHNETIUM TC 99M SESTAMIBI GENERIC - CARDIOLITE
10.0000 | Freq: Once | INTRAVENOUS | Status: AC | PRN
  Administered 2014-06-06: 10 via INTRAVENOUS

## 2014-06-06 MED ORDER — TECHNETIUM TC 99M SESTAMIBI GENERIC - CARDIOLITE
30.0000 | Freq: Once | INTRAVENOUS | Status: AC | PRN
  Administered 2014-06-06: 30 via INTRAVENOUS

## 2014-06-06 MED ORDER — REGADENOSON 0.4 MG/5ML IV SOLN
INTRAVENOUS | Status: AC
Start: 2014-06-06 — End: 2014-06-06
  Filled 2014-06-06: qty 5

## 2014-06-06 MED ORDER — REGADENOSON 0.4 MG/5ML IV SOLN
0.4000 mg | Freq: Once | INTRAVENOUS | Status: AC
  Administered 2014-06-06: 0.4 mg via INTRAVENOUS
  Filled 2014-06-06: qty 5

## 2014-06-06 NOTE — Discharge Instructions (Signed)

## 2014-06-06 NOTE — Progress Notes (Signed)
UR completed 

## 2014-06-06 NOTE — Discharge Summary (Signed)
Physician Discharge Summary  Morgan [name] EHM:[id number] DOB: [birth year] DOA: 06/05/2014  PCP: Morgan Napoleon, NP  Admit date: 06/05/2014 Discharge date: 06/06/2014  Time spent: 25 minutes  Recommendations for Outpatient Follow-up:  Discharge home with outpt follow up  Discharge Diagnoses:  Principal Problem:   Chest pain at rest  Active Problems:   Depression, major, recurrent   DM (diabetes mellitus) type I controlled with renal manifestation   ESRD on hemodialysis   Discharge Condition: fair  Diet recommendation: renal/ diabetic  Filed Weights   06/06/14 0000 06/06/14 0349  Weight: 75.206 kg (165 lb 12.8 oz) 75.206 kg (165 lb 12.8 oz)    History of present illness:  [name] with history of end-stage renal disease on dialysis, diabetes mellitus on Actos and insulin, major depression who was at dialysis  when she had chest heaviness. Patient was sent to the ED after completion of her dialysis. Admission EKG showed  T-wave flattening in lateral leads with negative troponin.. Admitted to hospitalist service for chest pain rule out.  Hospital Course:   Principal Problem:  Chest pain at rest  likely musculoskeletal. EKG showing T-wave flattening in lateral leads. Serial cardiac enzymes negative. Patient remains chest pain free.  Placed on aspirin and low-dose beta blocker. Lexiscan Myoview done on shows low risk for ischemia with normal EF of 63%  Active Problems:  End-stage renal disease on hemodialysis (Tu, th, sat) Follows with Morgan [name] as outpatient. Completed dialysis on the day of admission. Euvolemic.  Continue calcium acetate, Sensipar and Renvela  scheduled dialysis as outpatient.  Diabetes mellitus Last A1c of 7.8. Resume home dose humalog and actos.    Depression Continue home medication    Code Status: full code Family Communication: None at bedside today  Disposition Plan: Home  Discharge Exam: Filed  Vitals:   06/06/14 0952  BP: 109/66  Pulse: 100  Temp:   Resp:     General: Middle aged female in no acute distress HEENT: No pallor, moist oral mucosa Chest: Clear to auscultation bilaterally, no added sounds CVS: Normal S1&S2, no murmurs GI: Soft, nondistended, nontender Musculoskeletal: Warm, left UE AV fistula CNS: alert and oriented   Discharge Instructions    Current Discharge Medication List    CONTINUE these medications which have NOT CHANGED   Details  albuterol (PROVENTIL HFA;VENTOLIN HFA) 108 (90 BASE) MCG/ACT inhaler Inhale 1-2 puffs into the lungs every 6 (six) hours as needed for wheezing or shortness of breath.    B Complex-C-Folic Acid (DIALYVITE TABLET) TABS Take 1 tablet by mouth daily at 12 noon.    calcium acetate (PHOSLO) 667 MG capsule Take 667 mg by mouth 3 (three) times daily with meals.     cinacalcet (SENSIPAR) 60 MG tablet Take 60 mg by mouth daily.    glucose blood (ONE TOUCH ULTRA TEST) test strip 1 each by Other route 2 (two) times daily. And lancets 2/day Qty: 100 each, Refills: 12    insulin lispro (HUMALOG KWIKPEN) 100 UNIT/ML KiwkPen Inject 0.1 mLs (10 Units total) into the skin 3 (three) times daily with meals. And pen needles 3/day Qty: 15 mL, Refills: 11    levonorgestrel (MIRENA) 20 MCG/24HR IUD 1 each by Intrauterine route once.    lidocaine-prilocaine (EMLA) cream Apply 1 application topically daily as needed (dialysis).     pioglitazone (ACTOS) 15 MG tablet Take 15 mg by mouth daily.    sevelamer carbonate (RENVELA) 800 MG tablet Take 4,000-4,800 mg by mouth 3 (three) times daily  with meals.        Allergies  Allergen Reactions  . Iohexol      Code: HIVES, Desc: ITCHING, HIVES, SWELLING- 13 HR PRE-MEDS REQUIRED- ASM, Onset Date: [id number]   . Latex     unknown   Follow-up Information    Follow up with MorganDAVID, NP. Schedule an appointment as soon as possible for a visit in 1 week.   Specialty:  Family Medicine    Contact information:   [place] [place] [address] [phone]        The results of significant diagnostics from this hospitalization (including imaging, microbiology, ancillary and laboratory) are listed below for reference.    Significant Diagnostic Studies: Dg Chest 2 View  06/05/2014   CLINICAL DATA:  Chest pain.  EXAM: CHEST  2 VIEW  COMPARISON:  August 26, 2013.  FINDINGS: The heart size and mediastinal contours are within normal limits. Both lungs are clear. No pneumothorax or pleural effusion is noted. The visualized skeletal structures are unremarkable.  IMPRESSION: No active cardiopulmonary disease.   Electronically Signed   By: Marijo Conception, M.D.   On: 06/05/2014 14:11    Microbiology: No results found for this or any previous visit (from the past 240 hour(s)).   Labs: Basic Metabolic Panel:  Recent Labs Lab 06/05/14 1412  NA 139  K 4.1  CL 95*  CO2 34*  GLUCOSE 209*  BUN 17  CREATININE 5.08*  CALCIUM 8.7   Liver Function Tests: No results for input(s): AST, ALT, ALKPHOS, BILITOT, PROT, ALBUMIN in the last 168 hours. No results for input(s): LIPASE, AMYLASE in the last 168 hours. No results for input(s): AMMONIA in the last 168 hours. CBC:  Recent Labs Lab 06/05/14 1412  WBC 6.7  HGB 13.8  HCT 41.5  MCV 91.0  PLT 301   Cardiac Enzymes:  Recent Labs Lab 06/05/14 1412 06/05/14 2050 06/05/14 2345 06/06/14 0525  TROPONINI <0.03 <0.03 <0.03 <0.03   BNP: BNP (last 3 results) No results for input(s): BNP in the last 8760 hours.  ProBNP (last 3 results) No results for input(s): PROBNP in the last 8760 hours.  CBG:  Recent Labs Lab 06/05/14 1719 06/05/14 2202 06/06/14 0748  GLUCAP 173* 146* 171*       Signed:  Malvin [name]  [facility] 06/06/2014, 11:40 AM

## 2014-06-06 NOTE — Progress Notes (Signed)
   [place] Injected. ECG: no ST changes Tolerated well.  Complained of SOB, weakness. Images pending. Signed,  Richardson Dopp, PA-C   06/06/2014 9:43 AM

## 2014-06-10 DIAGNOSIS — I708 Atherosclerosis of other arteries: Secondary | ICD-10-CM | POA: Diagnosis not present

## 2014-06-10 DIAGNOSIS — Z7982 Long term (current) use of aspirin: Secondary | ICD-10-CM | POA: Diagnosis not present

## 2014-06-10 DIAGNOSIS — I771 Stricture of artery: Secondary | ICD-10-CM | POA: Diagnosis not present

## 2014-06-10 DIAGNOSIS — R55 Syncope and collapse: Secondary | ICD-10-CM | POA: Diagnosis not present

## 2014-06-10 DIAGNOSIS — Z992 Dependence on renal dialysis: Secondary | ICD-10-CM | POA: Diagnosis not present

## 2014-06-10 DIAGNOSIS — Z794 Long term (current) use of insulin: Secondary | ICD-10-CM | POA: Diagnosis not present

## 2014-06-10 DIAGNOSIS — D631 Anemia in chronic kidney disease: Secondary | ICD-10-CM | POA: Diagnosis not present

## 2014-06-10 DIAGNOSIS — E119 Type 2 diabetes mellitus without complications: Secondary | ICD-10-CM | POA: Diagnosis not present

## 2014-06-10 DIAGNOSIS — I120 Hypertensive chronic kidney disease with stage 5 chronic kidney disease or end stage renal disease: Secondary | ICD-10-CM | POA: Diagnosis not present

## 2014-06-10 DIAGNOSIS — R9431 Abnormal electrocardiogram [ECG] [EKG]: Secondary | ICD-10-CM | POA: Diagnosis not present

## 2014-06-10 DIAGNOSIS — I739 Peripheral vascular disease, unspecified: Secondary | ICD-10-CM | POA: Diagnosis not present

## 2014-06-10 DIAGNOSIS — N186 End stage renal disease: Secondary | ICD-10-CM | POA: Diagnosis not present

## 2014-06-11 DIAGNOSIS — I739 Peripheral vascular disease, unspecified: Secondary | ICD-10-CM | POA: Diagnosis not present

## 2014-06-11 DIAGNOSIS — R55 Syncope and collapse: Secondary | ICD-10-CM | POA: Diagnosis not present

## 2014-06-11 DIAGNOSIS — E119 Type 2 diabetes mellitus without complications: Secondary | ICD-10-CM | POA: Diagnosis not present

## 2014-06-11 DIAGNOSIS — D631 Anemia in chronic kidney disease: Secondary | ICD-10-CM | POA: Diagnosis not present

## 2014-06-11 DIAGNOSIS — I120 Hypertensive chronic kidney disease with stage 5 chronic kidney disease or end stage renal disease: Secondary | ICD-10-CM | POA: Diagnosis not present

## 2014-06-11 DIAGNOSIS — N186 End stage renal disease: Secondary | ICD-10-CM | POA: Diagnosis not present

## 2014-06-12 DIAGNOSIS — N186 End stage renal disease: Secondary | ICD-10-CM | POA: Diagnosis not present

## 2014-06-12 DIAGNOSIS — I120 Hypertensive chronic kidney disease with stage 5 chronic kidney disease or end stage renal disease: Secondary | ICD-10-CM | POA: Diagnosis not present

## 2014-06-12 DIAGNOSIS — R55 Syncope and collapse: Secondary | ICD-10-CM | POA: Diagnosis not present

## 2014-06-12 DIAGNOSIS — E119 Type 2 diabetes mellitus without complications: Secondary | ICD-10-CM | POA: Diagnosis not present

## 2014-06-12 DIAGNOSIS — I739 Peripheral vascular disease, unspecified: Secondary | ICD-10-CM | POA: Diagnosis not present

## 2014-06-12 DIAGNOSIS — D631 Anemia in chronic kidney disease: Secondary | ICD-10-CM | POA: Diagnosis not present

## 2014-06-17 ENCOUNTER — Encounter: Payer: Self-pay | Admitting: Vascular Surgery

## 2014-06-17 ENCOUNTER — Ambulatory Visit (HOSPITAL_COMMUNITY)
Admission: RE | Admit: 2014-06-17 | Discharge: 2014-06-17 | Disposition: A | Discharge status: Home / Self Care | Payer: Medicare Other | Source: Ambulatory Visit | Attending: Vascular Surgery | Admitting: Vascular Surgery

## 2014-06-17 ENCOUNTER — Ambulatory Visit (INDEPENDENT_AMBULATORY_CARE_PROVIDER_SITE_OTHER): Payer: Medicare Other | Admitting: Vascular Surgery

## 2014-06-17 ENCOUNTER — Other Ambulatory Visit: Payer: Self-pay | Admitting: *Deleted

## 2014-06-17 VITALS — BP 125/75 | HR 105 | Ht 62.0 in | Wt 168.9 lb

## 2014-06-17 DIAGNOSIS — E1129 Type 2 diabetes mellitus with other diabetic kidney complication: Secondary | ICD-10-CM | POA: Diagnosis not present

## 2014-06-17 DIAGNOSIS — I724 Aneurysm of artery of lower extremity: Secondary | ICD-10-CM | POA: Insufficient documentation

## 2014-06-17 DIAGNOSIS — R1909 Other intra-abdominal and pelvic swelling, mass and lump: Secondary | ICD-10-CM | POA: Diagnosis not present

## 2014-06-17 NOTE — Progress Notes (Signed)
[facility] [place] HISTORY AND PHYSICAL   History of Present Illness:  Patient is a 44 y.o. 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 The patient was recently referred by Dr. Augustin Coupe of [facility] to [facility] for evaluation of her left arm AV graft. No records of that procedure are available for review today. The patient states that when she awoke from her procedure she had a lump in her right groin and a stitch in her left arm AV graft. She states that the lump in her groin has been present since the procedure. This was one week ago. Her procedure was done by Dr. Andree Elk at [facility]. The patient was told that she was referred there to undergo a "specialized procedure".  Her left arm AV graft was placed by Dr. Scot Dock 4 years ago. She is currently on Plavix and aspirin subsequent to the procedure that she underwent at [facility]. She states that her graft has been working okay since she returned from there.  Other medical problems include diabetes and end-stage renal disease as well as hypertension. These are both currently controlled. Her nephrologist is Dr. Lorrene Reid. [facility] Thursday [facility] She does not report any bleeding from the right groin.  Past Medical History  Diagnosis Date  . Diabetes mellitus without complication   . Renal disorder   . Hypertension     Past Surgical History  Procedure Laterality Date  . Cesarean section    . Eye surgery      retinal detachment    Social History History  Substance Use Topics  . Smoking status: Never Smoker   . Smokeless tobacco: Never Used  . Alcohol Use: No    Family History Family History  Problem Relation Age of Onset  . Diabetes Maternal Grandmother     Allergies  Allergies  Allergen Reactions  . Iohexol      Code: HIVES, Desc: ITCHING, HIVES, SWELLING- 13 HR PRE-MEDS REQUIRED- ASM, Onset Date: [id number]   . Latex    unknown     Current Outpatient Prescriptions  Medication Sig Dispense Refill  . aspirin 81 MG tablet Take 81 mg by mouth daily.    . B Complex-C-Folic Acid (DIALYVITE TABLET) TABS Take 1 tablet by mouth daily at 12 noon.    . calcium acetate (PHOSLO) 667 MG capsule Take 667 mg by mouth 3 (three) times daily with meals.     . cinacalcet (SENSIPAR) 60 MG tablet Take 60 mg by mouth daily.    Marland Kitchen glucose blood (ONE TOUCH ULTRA TEST) test strip 1 each by Other route 2 (two) times daily. And lancets 2/day 100 each 12  . insulin lispro (HUMALOG KWIKPEN) 100 UNIT/ML KiwkPen Inject 0.1 mLs (10 Units total) into the skin 3 (three) times daily with meals. And pen needles 3/day 15 mL 11  . levonorgestrel (MIRENA) 20 MCG/24HR IUD 1 each by Intrauterine route once.    . lidocaine-prilocaine (EMLA) cream Apply 1 application topically daily as needed (dialysis).     . pioglitazone (ACTOS) 15 MG tablet Take 15 mg by mouth daily.    . sevelamer carbonate (RENVELA) 800 MG tablet Take 4,000-4,800 mg by mouth 3 (three) times daily with meals.     Marland Kitchen albuterol (PROVENTIL HFA;VENTOLIN HFA) 108 (90 BASE) MCG/ACT inhaler Inhale 1-2 puffs into the lungs every 6 (six) hours as needed for wheezing or shortness of breath.  No current facility-administered medications for this visit.    ROS:   General:  No weight loss, Fever, chills  HEENT: No recent headaches, no nasal bleeding, no visual changes, no sore throat  Neurologic: No dizziness, blackouts, seizures. No recent symptoms of stroke or mini- stroke. No recent episodes of slurred speech, or temporary blindness.  Cardiac: No recent episodes of chest pain/pressure, no shortness of breath at rest.  No shortness of breath with exertion.  Denies history of atrial fibrillation or irregular heartbeat  Vascular: No history of rest pain in feet.  No history of claudication.  No history of non-healing ulcer, No history of DVT   Pulmonary: No home oxygen, no productive  cough, no hemoptysis,  No asthma or wheezing  Musculoskeletal:  _0  Arthritis, _1  Low back pain,  _2  Joint pain  Hematologic:No history of hypercoagulable state.  No history of easy bleeding.  No history of anemia  Gastrointestinal: No hematochezia or melena,  No gastroesophageal reflux, no trouble swallowing  Urinary: _3  chronic Kidney disease, _4  on HD - _5  MWF or _6  TTHS, _7  Burning with urination, _8  Frequent urination, _9  Difficulty urinating;   Skin: No rashes  Psychological: No history of anxiety,  No history of depression   Physical Examination  Filed Vitals:   06/17/14 1602  BP: 125/75  Pulse: 105  Height: _10  (1.575 m)  Weight: 168 lb 14 oz (76.6 kg)  SpO2: 96%    Body mass index is 30.88 kg/(m^2).  General:  Alert and oriented, no acute distress HEENT: Normal Neck: No bruit or JVD Pulmonary: Clear to auscultation bilaterally Cardiac: Regular Rate and Rhythm without murmur Abdomen: Soft, non-tender, non-distended, no mass Skin: No rash Extremity Pulses:  2+ radial, brachial, femoral, dorsalis pedis right leg, 2 cm mass right common femoral region slightly tender to palpation no ulceration no erythema, audible bruit over the left upper arm loop graft Musculoskeletal: No deformity or edema  Neurologic: Upper and lower extremity motor 5/5 and symmetric  DATA:  Patient had a duplex ultrasound of her right groin today. This showed a right common femoral pseudoaneurysm with a long narrow neck. Pseudoaneurysm was approximately 2 cm in diameter. No obvious evidence of AV fistula.   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 Andree Elk at [place]:  Right femoral artery pseudoaneurysm thrombin injection scheduled for Monday in short stay. If we are unable to thrombose the pseudoaneurysm with a thrombin injection she may need open operative repair. All of this was discussed with the patient today as well as the  possibility of vessel thrombosis after thrombin injection. She understands and agrees to proceed. She should not do any significant work on Tuesday. She should be able to return to normal activities by Wednesday. To be able to have normal dialysis on Tuesday.  Ruta Hinds, MD [facility] [place] Office: [phone] Pager: [phone]

## 2014-06-18 ENCOUNTER — Other Ambulatory Visit: Payer: Self-pay

## 2014-06-18 ENCOUNTER — Telehealth: Payer: Self-pay | Admitting: Endocrinology

## 2014-06-18 ENCOUNTER — Ambulatory Visit: Payer: Self-pay | Admitting: Endocrinology

## 2014-06-18 DIAGNOSIS — F4323 Adjustment disorder with mixed anxiety and depressed mood: Secondary | ICD-10-CM | POA: Diagnosis not present

## 2014-06-18 NOTE — Telephone Encounter (Signed)
Patient no showed today's appt. Please advise on how to follow up. °A. No follow up necessary. °B. Follow up urgent. Contact patient immediately. °C. Follow up necessary. Contact patient and schedule visit in ___ days. °D. Follow up advised. Contact patient and schedule visit in ____weeks. ° °

## 2014-06-20 MED ORDER — SODIUM CHLORIDE 0.9 % IV SOLN
INTRAVENOUS | Status: DC
  Administered 2014-06-21: 10:00:00 via INTRAVENOUS

## 2014-06-20 NOTE — Telephone Encounter (Signed)
Is this a new patient?

## 2014-06-21 ENCOUNTER — Encounter (HOSPITAL_COMMUNITY)
Admission: RE | Disposition: A | Discharge status: Home / Self Care | Payer: Self-pay | Source: Ambulatory Visit | Attending: Vascular Surgery

## 2014-06-21 ENCOUNTER — Ambulatory Visit (HOSPITAL_COMMUNITY)
Admission: RE | Admit: 2014-06-21 | Discharge: 2014-06-21 | Disposition: A | Discharge status: Home / Self Care | Payer: Medicare Other | Source: Ambulatory Visit | Attending: Vascular Surgery | Admitting: Vascular Surgery

## 2014-06-21 ENCOUNTER — Telehealth: Payer: Self-pay | Admitting: Vascular Surgery

## 2014-06-21 DIAGNOSIS — Z992 Dependence on renal dialysis: Secondary | ICD-10-CM | POA: Diagnosis not present

## 2014-06-21 DIAGNOSIS — Z7982 Long term (current) use of aspirin: Secondary | ICD-10-CM | POA: Insufficient documentation

## 2014-06-21 DIAGNOSIS — Z7902 Long term (current) use of antithrombotics/antiplatelets: Secondary | ICD-10-CM | POA: Diagnosis not present

## 2014-06-21 DIAGNOSIS — Z975 Presence of (intrauterine) contraceptive device: Secondary | ICD-10-CM | POA: Insufficient documentation

## 2014-06-21 DIAGNOSIS — I724 Aneurysm of artery of lower extremity: Secondary | ICD-10-CM | POA: Diagnosis not present

## 2014-06-21 DIAGNOSIS — I120 Hypertensive chronic kidney disease with stage 5 chronic kidney disease or end stage renal disease: Secondary | ICD-10-CM | POA: Diagnosis not present

## 2014-06-21 DIAGNOSIS — N186 End stage renal disease: Secondary | ICD-10-CM | POA: Insufficient documentation

## 2014-06-21 DIAGNOSIS — Z794 Long term (current) use of insulin: Secondary | ICD-10-CM | POA: Insufficient documentation

## 2014-06-21 DIAGNOSIS — E119 Type 2 diabetes mellitus without complications: Secondary | ICD-10-CM | POA: Insufficient documentation

## 2014-06-21 DIAGNOSIS — Z79899 Other long term (current) drug therapy: Secondary | ICD-10-CM | POA: Insufficient documentation

## 2014-06-21 LAB — GLUCOSE, CAPILLARY: Glucose-Capillary: 248 mg/dL — ABNORMAL HIGH (ref 70–99)

## 2014-06-21 SURGERY — CARDIOVERSION
Anesthesia: LOCAL

## 2014-06-21 MED ORDER — LIDOCAINE HCL (PF) 1 % IJ SOLN
INTRAMUSCULAR | Status: AC
Start: 2014-06-21 — End: 2014-06-21
  Filled 2014-06-21: qty 30

## 2014-06-21 MED ORDER — CHLORHEXIDINE GLUCONATE CLOTH 2 % EX PADS: 6.0000 | MEDICATED_PAD | Freq: Once | CUTANEOUS | Status: DC

## 2014-06-21 MED ORDER — MIDAZOLAM HCL 2 MG/2ML IJ SOLN: 2.0000 mg | Freq: Once | INTRAMUSCULAR | Status: DC

## 2014-06-21 MED ORDER — FENTANYL CITRATE (PF) 100 MCG/2ML IJ SOLN
INTRAMUSCULAR | Status: AC
  Filled 2014-06-21: qty 2

## 2014-06-21 MED ORDER — ACETAMINOPHEN 325 MG PO TABS: 325.0000 mg | ORAL_TABLET | ORAL | Status: DC | PRN

## 2014-06-21 MED ORDER — GUAIFENESIN-DM 100-10 MG/5ML PO SYRP: 15.0000 mL | ORAL_SOLUTION | ORAL | Status: DC | PRN

## 2014-06-21 MED ORDER — PHENOL 1.4 % MT LIQD: 1.0000 | OROMUCOSAL | Status: DC | PRN

## 2014-06-21 MED ORDER — HYDRALAZINE HCL 20 MG/ML IJ SOLN: 5.0000 mg | INTRAMUSCULAR | Status: DC | PRN

## 2014-06-21 MED ORDER — ACETAMINOPHEN 325 MG RE SUPP: 325.0000 mg | RECTAL | Status: DC | PRN

## 2014-06-21 MED ORDER — ONDANSETRON HCL 4 MG/2ML IJ SOLN: 4.0000 mg | Freq: Four times a day (QID) | INTRAMUSCULAR | Status: DC | PRN

## 2014-06-21 MED ORDER — THROMBIN 20000 UNITS EX KIT
PACK | CUTANEOUS | Status: AC
Start: 2014-06-21 — End: 2014-06-21
  Filled 2014-06-21: qty 1

## 2014-06-21 MED ORDER — LABETALOL HCL 5 MG/ML IV SOLN: 10.0000 mg | INTRAVENOUS | Status: DC | PRN

## 2014-06-21 MED ORDER — MIDAZOLAM HCL 2 MG/2ML IJ SOLN
INTRAMUSCULAR | Status: AC
  Filled 2014-06-21: qty 2

## 2014-06-21 MED ORDER — METOPROLOL TARTRATE 1 MG/ML IV SOLN: 2.0000 mg | INTRAVENOUS | Status: DC | PRN

## 2014-06-21 MED ORDER — FENTANYL CITRATE (PF) 100 MCG/2ML IJ SOLN: 25.0000 ug | Freq: Once | INTRAMUSCULAR | Status: DC

## 2014-06-21 NOTE — Progress Notes (Signed)
VASCULAR LAB PRELIMINARY  PRELIMINARY  PRELIMINARY  PRELIMINARY  Thrombin injection for pseudoaneurysm closure completed.    Preliminary report:  Positive for pseudoaneurysm closure immediately post injection and 2 hours after.  Garnell Phenix, RVS 06/21/2014, 2:31 PM

## 2014-06-21 NOTE — Telephone Encounter (Signed)
-----   Message from Mena Goes, RN sent at 06/21/2014  1:23 PM EDT ----- Regarding: Schedule FYI no follow up   ----- Message -----    From: Elam Dutch, MD    Sent: 06/21/2014  11:47 AM      To: Vvs Charge Pool  US guided thrombin injection of right femoral pseudoaneurysm  She does not need follow up  Ruta Hinds

## 2014-06-21 NOTE — H&P (View-Only) (Signed)
[facility] [place] HISTORY AND PHYSICAL   History of Present Illness:  Patient is a 44 y.o. 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 The patient was recently referred by Dr. Augustin Coupe of [facility] to [facility] for evaluation of her left arm AV graft. No records of that procedure are available for review today. The patient states that when she awoke from her procedure she had a lump in her right groin and a stitch in her left arm AV graft. She states that the lump in her groin has been present since the procedure. This was one week ago. Her procedure was done by Dr. Andree Elk at [facility]. The patient was told that she was referred there to undergo a "specialized procedure".  Her left arm AV graft was placed by Dr. Scot Dock 4 years ago. She is currently on Plavix and aspirin subsequent to the procedure that she underwent at [facility]. She states that her graft has been working okay since she returned from there.  Other medical problems include diabetes and end-stage renal disease as well as hypertension. These are both currently controlled. Her nephrologist is Dr. Lorrene Reid. [facility] She does not report any bleeding from the right groin.  Past Medical History  Diagnosis Date  . Diabetes mellitus without complication   . Renal disorder   . Hypertension     Past Surgical History  Procedure Laterality Date  . Cesarean section    . Eye surgery      retinal detachment    Social History History  Substance Use Topics  . Smoking status: Never Smoker   . Smokeless tobacco: Never Used  . Alcohol Use: No    Family History Family History  Problem Relation Age of Onset  . Diabetes Maternal Grandmother     Allergies  Allergies  Allergen Reactions  . Iohexol      Code: HIVES, Desc: ITCHING, HIVES, SWELLING- 13 HR PRE-MEDS REQUIRED- ASM, Onset Date: 25427062   . Latex    unknown     Current Outpatient Prescriptions  Medication Sig Dispense Refill  . aspirin 81 MG tablet Take 81 mg by mouth daily.    . B Complex-C-Folic Acid (DIALYVITE TABLET) TABS Take 1 tablet by mouth daily at 12 noon.    . calcium acetate (PHOSLO) 667 MG capsule Take 667 mg by mouth 3 (three) times daily with meals.     . cinacalcet (SENSIPAR) 60 MG tablet Take 60 mg by mouth daily.    Marland Kitchen glucose blood (ONE TOUCH ULTRA TEST) test strip 1 each by Other route 2 (two) times daily. And lancets 2/day 100 each 12  . insulin lispro (HUMALOG KWIKPEN) 100 UNIT/ML KiwkPen Inject 0.1 mLs (10 Units total) into the skin 3 (three) times daily with meals. And pen needles 3/day 15 mL 11  . levonorgestrel (MIRENA) 20 MCG/24HR IUD 1 each by Intrauterine route once.    . lidocaine-prilocaine (EMLA) cream Apply 1 application topically daily as needed (dialysis).     . pioglitazone (ACTOS) 15 MG tablet Take 15 mg by mouth daily.    . sevelamer carbonate (RENVELA) 800 MG tablet Take 4,000-4,800 mg by mouth 3 (three) times daily with meals.     Marland Kitchen albuterol (PROVENTIL HFA;VENTOLIN HFA) 108 (90 BASE) MCG/ACT inhaler Inhale 1-2 puffs into the lungs every 6 (six) hours as needed for wheezing or shortness of breath.  No current facility-administered medications for this visit.    ROS:   General:  No weight loss, Fever, chills  HEENT: No recent headaches, no nasal bleeding, no visual changes, no sore throat  Neurologic: No dizziness, blackouts, seizures. No recent symptoms of stroke or mini- stroke. No recent episodes of slurred speech, or temporary blindness.  Cardiac: No recent episodes of chest pain/pressure, no shortness of breath at rest.  No shortness of breath with exertion.  Denies history of atrial fibrillation or irregular heartbeat  Vascular: No history of rest pain in feet.  No history of claudication.  No history of non-healing ulcer, No history of DVT   Pulmonary: No home oxygen, no productive  cough, no hemoptysis,  No asthma or wheezing  Musculoskeletal:  _0  Arthritis, _1  Low back pain,  _2  Joint pain  Hematologic:No history of hypercoagulable state.  No history of easy bleeding.  No history of anemia  Gastrointestinal: No hematochezia or melena,  No gastroesophageal reflux, no trouble swallowing  Urinary: _3  chronic Kidney disease, _4  on HD - _5  MWF or _6  TTHS, _7  Burning with urination, _8  Frequent urination, _9  Difficulty urinating;   Skin: No rashes  Psychological: No history of anxiety,  No history of depression   Physical Examination  Filed Vitals:   06/17/14 1602  BP: 125/75  Pulse: 105  Height: _10  (1.575 m)  Weight: 168 lb 14 oz (76.6 kg)  SpO2: 96%    Body mass index is 30.88 kg/(m^2).  General:  Alert and oriented, no acute distress HEENT: Normal Neck: No bruit or JVD Pulmonary: Clear to auscultation bilaterally Cardiac: Regular Rate and Rhythm without murmur Abdomen: Soft, non-tender, non-distended, no mass Skin: No rash Extremity Pulses:  2+ radial, brachial, femoral, dorsalis pedis right leg, 2 cm mass right common femoral region slightly tender to palpation no ulceration no erythema, audible bruit over the left upper arm loop graft Musculoskeletal: No deformity or edema  Neurologic: Upper and lower extremity motor 5/5 and symmetric  DATA:  Patient had a duplex ultrasound of her right groin today. This showed a right common femoral pseudoaneurysm with a long narrow neck. Pseudoaneurysm was approximately 2 cm in diameter. No obvious evidence of AV fistula.   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 Andree Elk at [place]:  Right femoral artery pseudoaneurysm thrombin injection scheduled for Monday in short stay. If we are unable to thrombose the pseudoaneurysm with a thrombin injection she may need open operative repair. All of this was discussed with the patient today as well as the  possibility of vessel thrombosis after thrombin injection. She understands and agrees to proceed. She should not do any significant work on Tuesday. She should be able to return to normal activities by Wednesday. To be able to have normal dialysis on Tuesday.  Ruta Hinds, MD [facility] [place] Office: [phone] Pager: [phone]

## 2014-06-21 NOTE — Discharge Instructions (Signed)
Pseudoaneurysm A pseudoaneurysm occurs when there is injury to an artery. The injury allows blood to leak out of the artery. The leaking blood collects or pools and is contained in the nearby tissues. CAUSES  The most common cause for a pseudoaneurysm is a procedure such as angiography. After angiography, if the insertion site does not fully seal, blood may leak out of the artery. Other causes include:  Trauma to the walls of an artery.  Bypass artery grafting surgery.  An infection affecting the walls of an artery.  A heart attack (myocardial infarction).  The rupture of an aneurysm. SYMPTOMS  Symptoms may include:  Pain, discomfort, or tenderness at the angiography insertion site or site of trauma or surgery.  Swelling at the angiography insertion site or site of trauma or surgery.  Bruising or discoloration at the angiography insertion site or site of trauma or surgery.  A pulsing mass at the angiography insertion site or site of trauma or surgery. DIAGNOSIS  Doppler ultrasonography is used to diagnose the pseudoaneurysm. This imaging exam shows the blood flow in the arteries and pseudoaneurysm. TREATMENT A pseudoaneurysm may resolve without treatment. To prevent uncontrolled bleeding or other problems, your health care provider may recommend one of the following treatments:  Injecting a blood-clotting enzyme, such as thrombin, into the area.  Repairing the artery with surgery.  Applying pressure (compression) to the pseudoaneurysm. HOME CARE INSTRUCTIONS  Only take over-the-counter or prescription medicines for pain or discomfort as directed by your health care provider.  Take your prescribed medicine as directed by your health care provider. You may need to keep taking blood thinners if they were prescribed for you.  Follow your health care provider's advice for physical activity.  Keep all follow-up appointments as directed by your health care provider. SEEK MEDICAL  CARE IF:  Your pain, discomfort, or tenderness at the angiography insertion site or site of trauma or surgery increases.  The swelling at the angiography insertion site or site of trauma or surgery increases. SEEK IMMEDIATE MEDICAL CARE IF:   You have severe or persistent pain at the angiography insertion site or site of trauma or surgery.  There is bleeding or drainage from the angiography insertion site or site of trauma or surgery.  The extremity used as the angiography insertion site or site of trauma or surgery becomes painful, discolored, cold, or numb.  You develop chest pain or shortness of breath, feel faint, or pass out. MAKE SURE YOU:  Understand these instructions.  Will watch your condition.  Will get help right away if you are not doing well or get worse. Document Released: 08/01/2007 Document Revised: 02/17/2013 Document Reviewed: 09/22/2012 [facility] Patient Information 2015 [place], [place]. This information is not intended to replace advice given to you by your health care provider. Make sure you discuss any questions you have with your health care provider.

## 2014-06-21 NOTE — Op Note (Signed)
Procedure: Ultrasound guided injection of right femoral pseudoaneurysm PreOp: right common femoral artery pseudoaneurysm Post Op: same Anesthesia: local Details: Pt groin prepped with betadine.  Right common femoral pseudoaneurysm identified under ultrasound.  Recombinant thrombin reconstituted with saline.  Local anesthesia over right common femoral artery 1% plain lidocaine.  Introducer needle placed just into pseudoaneurysm cavity under ultrasound.  0.5 cc thrombin injected under direct vision.  Successful thrombosis of pseudoaneurysm.  Pressure held for 5 min with ultrasound probe.  Follow up study in 2 hours.  Pt had palpable right DP pulse 2+ pre and post procedure.  Ruta Hinds, MD [facility] [place] Office: [phone] Pager: [phone]

## 2014-06-21 NOTE — Interval H&P Note (Signed)
History and Physical Interval Note:  06/21/2014 10:39 AM  Morgan [name]  has presented today for surgery, with the diagnosis of pseudo-Aneurysm  The various methods of treatment have been discussed with the patient and family. After consideration of risks, benefits and other options for treatment, the patient has consented to  Procedure(s): Right Femoral Artery Pseudo-Aneurysm Injection (N/A) as a surgical intervention .  The patient's history has been reviewed, patient examined, no change in status, stable for surgery.  I have reviewed the patient's chart and labs.  Questions were answered to the patient's satisfaction.     FIELDS,CHARLES E

## 2014-06-26 DIAGNOSIS — Z992 Dependence on renal dialysis: Secondary | ICD-10-CM | POA: Diagnosis not present

## 2014-06-26 DIAGNOSIS — N186 End stage renal disease: Secondary | ICD-10-CM | POA: Diagnosis not present

## 2014-06-26 DIAGNOSIS — E1129 Type 2 diabetes mellitus with other diabetic kidney complication: Secondary | ICD-10-CM | POA: Diagnosis not present

## 2014-06-28 DIAGNOSIS — N2581 Secondary hyperparathyroidism of renal origin: Secondary | ICD-10-CM | POA: Diagnosis not present

## 2014-06-28 DIAGNOSIS — D509 Iron deficiency anemia, unspecified: Secondary | ICD-10-CM | POA: Diagnosis not present

## 2014-06-28 DIAGNOSIS — E1129 Type 2 diabetes mellitus with other diabetic kidney complication: Secondary | ICD-10-CM | POA: Diagnosis not present

## 2014-06-28 DIAGNOSIS — T82858D Stenosis of vascular prosthetic devices, implants and grafts, subsequent encounter: Secondary | ICD-10-CM | POA: Diagnosis not present

## 2014-06-28 DIAGNOSIS — I871 Compression of vein: Secondary | ICD-10-CM | POA: Diagnosis not present

## 2014-06-28 DIAGNOSIS — N186 End stage renal disease: Secondary | ICD-10-CM | POA: Diagnosis not present

## 2014-06-28 DIAGNOSIS — Z992 Dependence on renal dialysis: Secondary | ICD-10-CM | POA: Diagnosis not present

## 2014-07-01 DIAGNOSIS — D509 Iron deficiency anemia, unspecified: Secondary | ICD-10-CM | POA: Diagnosis not present

## 2014-07-01 DIAGNOSIS — E1129 Type 2 diabetes mellitus with other diabetic kidney complication: Secondary | ICD-10-CM | POA: Diagnosis not present

## 2014-07-01 DIAGNOSIS — N2581 Secondary hyperparathyroidism of renal origin: Secondary | ICD-10-CM | POA: Diagnosis not present

## 2014-07-01 DIAGNOSIS — N186 End stage renal disease: Secondary | ICD-10-CM | POA: Diagnosis not present

## 2014-07-02 DIAGNOSIS — Z124 Encounter for screening for malignant neoplasm of cervix: Secondary | ICD-10-CM | POA: Diagnosis not present

## 2014-07-02 DIAGNOSIS — Z01419 Encounter for gynecological examination (general) (routine) without abnormal findings: Secondary | ICD-10-CM | POA: Diagnosis not present

## 2014-07-02 DIAGNOSIS — N921 Excessive and frequent menstruation with irregular cycle: Secondary | ICD-10-CM | POA: Diagnosis not present

## 2014-07-02 DIAGNOSIS — F4323 Adjustment disorder with mixed anxiety and depressed mood: Secondary | ICD-10-CM | POA: Diagnosis not present

## 2014-07-02 DIAGNOSIS — N760 Acute vaginitis: Secondary | ICD-10-CM | POA: Diagnosis not present

## 2014-07-03 DIAGNOSIS — N2581 Secondary hyperparathyroidism of renal origin: Secondary | ICD-10-CM | POA: Diagnosis not present

## 2014-07-03 DIAGNOSIS — E1129 Type 2 diabetes mellitus with other diabetic kidney complication: Secondary | ICD-10-CM | POA: Diagnosis not present

## 2014-07-03 DIAGNOSIS — D509 Iron deficiency anemia, unspecified: Secondary | ICD-10-CM | POA: Diagnosis not present

## 2014-07-03 DIAGNOSIS — N186 End stage renal disease: Secondary | ICD-10-CM | POA: Diagnosis not present

## 2014-07-06 DIAGNOSIS — N186 End stage renal disease: Secondary | ICD-10-CM | POA: Diagnosis not present

## 2014-07-06 DIAGNOSIS — E1129 Type 2 diabetes mellitus with other diabetic kidney complication: Secondary | ICD-10-CM | POA: Diagnosis not present

## 2014-07-06 DIAGNOSIS — N2581 Secondary hyperparathyroidism of renal origin: Secondary | ICD-10-CM | POA: Diagnosis not present

## 2014-07-06 DIAGNOSIS — D509 Iron deficiency anemia, unspecified: Secondary | ICD-10-CM | POA: Diagnosis not present

## 2014-07-08 DIAGNOSIS — N2581 Secondary hyperparathyroidism of renal origin: Secondary | ICD-10-CM | POA: Diagnosis not present

## 2014-07-08 DIAGNOSIS — N186 End stage renal disease: Secondary | ICD-10-CM | POA: Diagnosis not present

## 2014-07-08 DIAGNOSIS — E1129 Type 2 diabetes mellitus with other diabetic kidney complication: Secondary | ICD-10-CM | POA: Diagnosis not present

## 2014-07-08 DIAGNOSIS — D509 Iron deficiency anemia, unspecified: Secondary | ICD-10-CM | POA: Diagnosis not present

## 2014-07-09 DIAGNOSIS — F4323 Adjustment disorder with mixed anxiety and depressed mood: Secondary | ICD-10-CM | POA: Diagnosis not present

## 2014-07-10 DIAGNOSIS — N186 End stage renal disease: Secondary | ICD-10-CM | POA: Diagnosis not present

## 2014-07-10 DIAGNOSIS — E1129 Type 2 diabetes mellitus with other diabetic kidney complication: Secondary | ICD-10-CM | POA: Diagnosis not present

## 2014-07-10 DIAGNOSIS — D509 Iron deficiency anemia, unspecified: Secondary | ICD-10-CM | POA: Diagnosis not present

## 2014-07-10 DIAGNOSIS — N2581 Secondary hyperparathyroidism of renal origin: Secondary | ICD-10-CM | POA: Diagnosis not present

## 2014-07-13 DIAGNOSIS — N186 End stage renal disease: Secondary | ICD-10-CM | POA: Diagnosis not present

## 2014-07-13 DIAGNOSIS — N2581 Secondary hyperparathyroidism of renal origin: Secondary | ICD-10-CM | POA: Diagnosis not present

## 2014-07-13 DIAGNOSIS — E1129 Type 2 diabetes mellitus with other diabetic kidney complication: Secondary | ICD-10-CM | POA: Diagnosis not present

## 2014-07-13 DIAGNOSIS — D509 Iron deficiency anemia, unspecified: Secondary | ICD-10-CM | POA: Diagnosis not present

## 2014-07-15 DIAGNOSIS — D509 Iron deficiency anemia, unspecified: Secondary | ICD-10-CM | POA: Diagnosis not present

## 2014-07-15 DIAGNOSIS — N2581 Secondary hyperparathyroidism of renal origin: Secondary | ICD-10-CM | POA: Diagnosis not present

## 2014-07-15 DIAGNOSIS — N186 End stage renal disease: Secondary | ICD-10-CM | POA: Diagnosis not present

## 2014-07-15 DIAGNOSIS — E1129 Type 2 diabetes mellitus with other diabetic kidney complication: Secondary | ICD-10-CM | POA: Diagnosis not present

## 2014-07-17 DIAGNOSIS — N2581 Secondary hyperparathyroidism of renal origin: Secondary | ICD-10-CM | POA: Diagnosis not present

## 2014-07-17 DIAGNOSIS — E1129 Type 2 diabetes mellitus with other diabetic kidney complication: Secondary | ICD-10-CM | POA: Diagnosis not present

## 2014-07-17 DIAGNOSIS — D509 Iron deficiency anemia, unspecified: Secondary | ICD-10-CM | POA: Diagnosis not present

## 2014-07-17 DIAGNOSIS — N186 End stage renal disease: Secondary | ICD-10-CM | POA: Diagnosis not present

## 2014-07-20 DIAGNOSIS — N186 End stage renal disease: Secondary | ICD-10-CM | POA: Diagnosis not present

## 2014-07-20 DIAGNOSIS — E1129 Type 2 diabetes mellitus with other diabetic kidney complication: Secondary | ICD-10-CM | POA: Diagnosis not present

## 2014-07-20 DIAGNOSIS — D509 Iron deficiency anemia, unspecified: Secondary | ICD-10-CM | POA: Diagnosis not present

## 2014-07-20 DIAGNOSIS — N2581 Secondary hyperparathyroidism of renal origin: Secondary | ICD-10-CM | POA: Diagnosis not present

## 2014-07-22 DIAGNOSIS — E1129 Type 2 diabetes mellitus with other diabetic kidney complication: Secondary | ICD-10-CM | POA: Diagnosis not present

## 2014-07-22 DIAGNOSIS — N186 End stage renal disease: Secondary | ICD-10-CM | POA: Diagnosis not present

## 2014-07-22 DIAGNOSIS — N2581 Secondary hyperparathyroidism of renal origin: Secondary | ICD-10-CM | POA: Diagnosis not present

## 2014-07-22 DIAGNOSIS — D509 Iron deficiency anemia, unspecified: Secondary | ICD-10-CM | POA: Diagnosis not present

## 2014-07-24 DIAGNOSIS — N186 End stage renal disease: Secondary | ICD-10-CM | POA: Diagnosis not present

## 2014-07-24 DIAGNOSIS — D509 Iron deficiency anemia, unspecified: Secondary | ICD-10-CM | POA: Diagnosis not present

## 2014-07-24 DIAGNOSIS — N2581 Secondary hyperparathyroidism of renal origin: Secondary | ICD-10-CM | POA: Diagnosis not present

## 2014-07-24 DIAGNOSIS — E1129 Type 2 diabetes mellitus with other diabetic kidney complication: Secondary | ICD-10-CM | POA: Diagnosis not present

## 2014-07-27 DIAGNOSIS — Z992 Dependence on renal dialysis: Secondary | ICD-10-CM | POA: Diagnosis not present

## 2014-07-27 DIAGNOSIS — N2581 Secondary hyperparathyroidism of renal origin: Secondary | ICD-10-CM | POA: Diagnosis not present

## 2014-07-27 DIAGNOSIS — D509 Iron deficiency anemia, unspecified: Secondary | ICD-10-CM | POA: Diagnosis not present

## 2014-07-27 DIAGNOSIS — N186 End stage renal disease: Secondary | ICD-10-CM | POA: Diagnosis not present

## 2014-07-27 DIAGNOSIS — E1129 Type 2 diabetes mellitus with other diabetic kidney complication: Secondary | ICD-10-CM | POA: Diagnosis not present

## 2014-07-29 DIAGNOSIS — D631 Anemia in chronic kidney disease: Secondary | ICD-10-CM | POA: Diagnosis not present

## 2014-07-29 DIAGNOSIS — D509 Iron deficiency anemia, unspecified: Secondary | ICD-10-CM | POA: Diagnosis not present

## 2014-07-29 DIAGNOSIS — N186 End stage renal disease: Secondary | ICD-10-CM | POA: Diagnosis not present

## 2014-07-29 DIAGNOSIS — N2581 Secondary hyperparathyroidism of renal origin: Secondary | ICD-10-CM | POA: Diagnosis not present

## 2014-07-31 DIAGNOSIS — N2581 Secondary hyperparathyroidism of renal origin: Secondary | ICD-10-CM | POA: Diagnosis not present

## 2014-07-31 DIAGNOSIS — N186 End stage renal disease: Secondary | ICD-10-CM | POA: Diagnosis not present

## 2014-08-03 DIAGNOSIS — D509 Iron deficiency anemia, unspecified: Secondary | ICD-10-CM | POA: Diagnosis not present

## 2014-08-03 DIAGNOSIS — N2581 Secondary hyperparathyroidism of renal origin: Secondary | ICD-10-CM | POA: Diagnosis not present

## 2014-08-03 DIAGNOSIS — N186 End stage renal disease: Secondary | ICD-10-CM | POA: Diagnosis not present

## 2014-08-03 DIAGNOSIS — D631 Anemia in chronic kidney disease: Secondary | ICD-10-CM | POA: Diagnosis not present

## 2014-08-05 DIAGNOSIS — N186 End stage renal disease: Secondary | ICD-10-CM | POA: Diagnosis not present

## 2014-08-05 DIAGNOSIS — D509 Iron deficiency anemia, unspecified: Secondary | ICD-10-CM | POA: Diagnosis not present

## 2014-08-05 DIAGNOSIS — N2581 Secondary hyperparathyroidism of renal origin: Secondary | ICD-10-CM | POA: Diagnosis not present

## 2014-08-05 DIAGNOSIS — F4323 Adjustment disorder with mixed anxiety and depressed mood: Secondary | ICD-10-CM | POA: Diagnosis not present

## 2014-08-05 DIAGNOSIS — D631 Anemia in chronic kidney disease: Secondary | ICD-10-CM | POA: Diagnosis not present

## 2014-08-07 DIAGNOSIS — N186 End stage renal disease: Secondary | ICD-10-CM | POA: Diagnosis not present

## 2014-08-07 DIAGNOSIS — N2581 Secondary hyperparathyroidism of renal origin: Secondary | ICD-10-CM | POA: Diagnosis not present

## 2014-08-07 DIAGNOSIS — D509 Iron deficiency anemia, unspecified: Secondary | ICD-10-CM | POA: Diagnosis not present

## 2014-08-07 DIAGNOSIS — D631 Anemia in chronic kidney disease: Secondary | ICD-10-CM | POA: Diagnosis not present

## 2014-08-10 DIAGNOSIS — N2581 Secondary hyperparathyroidism of renal origin: Secondary | ICD-10-CM | POA: Diagnosis not present

## 2014-08-10 DIAGNOSIS — D631 Anemia in chronic kidney disease: Secondary | ICD-10-CM | POA: Diagnosis not present

## 2014-08-10 DIAGNOSIS — N186 End stage renal disease: Secondary | ICD-10-CM | POA: Diagnosis not present

## 2014-08-10 DIAGNOSIS — D509 Iron deficiency anemia, unspecified: Secondary | ICD-10-CM | POA: Diagnosis not present

## 2014-08-12 DIAGNOSIS — D509 Iron deficiency anemia, unspecified: Secondary | ICD-10-CM | POA: Diagnosis not present

## 2014-08-12 DIAGNOSIS — N186 End stage renal disease: Secondary | ICD-10-CM | POA: Diagnosis not present

## 2014-08-12 DIAGNOSIS — D631 Anemia in chronic kidney disease: Secondary | ICD-10-CM | POA: Diagnosis not present

## 2014-08-12 DIAGNOSIS — N2581 Secondary hyperparathyroidism of renal origin: Secondary | ICD-10-CM | POA: Diagnosis not present

## 2014-08-13 DIAGNOSIS — F4323 Adjustment disorder with mixed anxiety and depressed mood: Secondary | ICD-10-CM | POA: Diagnosis not present

## 2014-08-14 DIAGNOSIS — N2581 Secondary hyperparathyroidism of renal origin: Secondary | ICD-10-CM | POA: Diagnosis not present

## 2014-08-14 DIAGNOSIS — D631 Anemia in chronic kidney disease: Secondary | ICD-10-CM | POA: Diagnosis not present

## 2014-08-14 DIAGNOSIS — N186 End stage renal disease: Secondary | ICD-10-CM | POA: Diagnosis not present

## 2014-08-14 DIAGNOSIS — D509 Iron deficiency anemia, unspecified: Secondary | ICD-10-CM | POA: Diagnosis not present

## 2014-08-17 DIAGNOSIS — D631 Anemia in chronic kidney disease: Secondary | ICD-10-CM | POA: Diagnosis not present

## 2014-08-17 DIAGNOSIS — D509 Iron deficiency anemia, unspecified: Secondary | ICD-10-CM | POA: Diagnosis not present

## 2014-08-17 DIAGNOSIS — N2581 Secondary hyperparathyroidism of renal origin: Secondary | ICD-10-CM | POA: Diagnosis not present

## 2014-08-17 DIAGNOSIS — N186 End stage renal disease: Secondary | ICD-10-CM | POA: Diagnosis not present

## 2014-08-18 DIAGNOSIS — F4323 Adjustment disorder with mixed anxiety and depressed mood: Secondary | ICD-10-CM | POA: Diagnosis not present

## 2014-08-19 DIAGNOSIS — D509 Iron deficiency anemia, unspecified: Secondary | ICD-10-CM | POA: Diagnosis not present

## 2014-08-19 DIAGNOSIS — N2581 Secondary hyperparathyroidism of renal origin: Secondary | ICD-10-CM | POA: Diagnosis not present

## 2014-08-19 DIAGNOSIS — N186 End stage renal disease: Secondary | ICD-10-CM | POA: Diagnosis not present

## 2014-08-19 DIAGNOSIS — D631 Anemia in chronic kidney disease: Secondary | ICD-10-CM | POA: Diagnosis not present

## 2014-08-21 DIAGNOSIS — N186 End stage renal disease: Secondary | ICD-10-CM | POA: Diagnosis not present

## 2014-08-21 DIAGNOSIS — N2581 Secondary hyperparathyroidism of renal origin: Secondary | ICD-10-CM | POA: Diagnosis not present

## 2014-08-21 DIAGNOSIS — D509 Iron deficiency anemia, unspecified: Secondary | ICD-10-CM | POA: Diagnosis not present

## 2014-08-21 DIAGNOSIS — D631 Anemia in chronic kidney disease: Secondary | ICD-10-CM | POA: Diagnosis not present

## 2014-08-24 DIAGNOSIS — N186 End stage renal disease: Secondary | ICD-10-CM | POA: Diagnosis not present

## 2014-08-24 DIAGNOSIS — D631 Anemia in chronic kidney disease: Secondary | ICD-10-CM | POA: Diagnosis not present

## 2014-08-24 DIAGNOSIS — D509 Iron deficiency anemia, unspecified: Secondary | ICD-10-CM | POA: Diagnosis not present

## 2014-08-24 DIAGNOSIS — N2581 Secondary hyperparathyroidism of renal origin: Secondary | ICD-10-CM | POA: Diagnosis not present

## 2014-08-26 DIAGNOSIS — Z992 Dependence on renal dialysis: Secondary | ICD-10-CM | POA: Diagnosis not present

## 2014-08-26 DIAGNOSIS — E1129 Type 2 diabetes mellitus with other diabetic kidney complication: Secondary | ICD-10-CM | POA: Diagnosis not present

## 2014-08-26 DIAGNOSIS — D631 Anemia in chronic kidney disease: Secondary | ICD-10-CM | POA: Diagnosis not present

## 2014-08-26 DIAGNOSIS — D509 Iron deficiency anemia, unspecified: Secondary | ICD-10-CM | POA: Diagnosis not present

## 2014-08-26 DIAGNOSIS — N2581 Secondary hyperparathyroidism of renal origin: Secondary | ICD-10-CM | POA: Diagnosis not present

## 2014-08-26 DIAGNOSIS — N186 End stage renal disease: Secondary | ICD-10-CM | POA: Diagnosis not present

## 2014-08-28 DIAGNOSIS — N186 End stage renal disease: Secondary | ICD-10-CM | POA: Diagnosis not present

## 2014-08-28 DIAGNOSIS — E1129 Type 2 diabetes mellitus with other diabetic kidney complication: Secondary | ICD-10-CM | POA: Diagnosis not present

## 2014-08-28 DIAGNOSIS — D509 Iron deficiency anemia, unspecified: Secondary | ICD-10-CM | POA: Diagnosis not present

## 2014-08-28 DIAGNOSIS — N2581 Secondary hyperparathyroidism of renal origin: Secondary | ICD-10-CM | POA: Diagnosis not present

## 2014-09-10 ENCOUNTER — Other Ambulatory Visit: Payer: Self-pay

## 2014-09-10 MED ORDER — INSULIN PEN NEEDLE 31G X 5 MM MISC: Status: DC

## 2014-09-16 DIAGNOSIS — E1129 Type 2 diabetes mellitus with other diabetic kidney complication: Secondary | ICD-10-CM | POA: Diagnosis not present

## 2014-09-21 DIAGNOSIS — F4323 Adjustment disorder with mixed anxiety and depressed mood: Secondary | ICD-10-CM | POA: Diagnosis not present

## 2014-09-22 ENCOUNTER — Inpatient Hospital Stay (HOSPITAL_COMMUNITY)
Admission: EM | Admit: 2014-09-22 | Discharge: 2014-09-24 | DRG: 640 | Disposition: A | Discharge status: Home / Self Care | Payer: Medicare Other | Attending: Internal Medicine | Admitting: Internal Medicine

## 2014-09-22 ENCOUNTER — Encounter (HOSPITAL_COMMUNITY): Payer: Self-pay | Admitting: Emergency Medicine

## 2014-09-22 ENCOUNTER — Emergency Department (HOSPITAL_COMMUNITY): Payer: Medicare Other

## 2014-09-22 DIAGNOSIS — Z91040 Latex allergy status: Secondary | ICD-10-CM | POA: Diagnosis not present

## 2014-09-22 DIAGNOSIS — I724 Aneurysm of artery of lower extremity: Secondary | ICD-10-CM | POA: Diagnosis present

## 2014-09-22 DIAGNOSIS — Z833 Family history of diabetes mellitus: Secondary | ICD-10-CM | POA: Diagnosis not present

## 2014-09-22 DIAGNOSIS — K828 Other specified diseases of gallbladder: Secondary | ICD-10-CM | POA: Diagnosis present

## 2014-09-22 DIAGNOSIS — N838 Other noninflammatory disorders of ovary, fallopian tube and broad ligament: Secondary | ICD-10-CM | POA: Diagnosis not present

## 2014-09-22 DIAGNOSIS — N186 End stage renal disease: Secondary | ICD-10-CM | POA: Diagnosis present

## 2014-09-22 DIAGNOSIS — Z794 Long term (current) use of insulin: Secondary | ICD-10-CM | POA: Diagnosis not present

## 2014-09-22 DIAGNOSIS — Z91041 Radiographic dye allergy status: Secondary | ICD-10-CM

## 2014-09-22 DIAGNOSIS — E1022 Type 1 diabetes mellitus with diabetic chronic kidney disease: Secondary | ICD-10-CM | POA: Diagnosis present

## 2014-09-22 DIAGNOSIS — R197 Diarrhea, unspecified: Secondary | ICD-10-CM | POA: Diagnosis present

## 2014-09-22 DIAGNOSIS — E1065 Type 1 diabetes mellitus with hyperglycemia: Secondary | ICD-10-CM | POA: Diagnosis present

## 2014-09-22 DIAGNOSIS — Z7982 Long term (current) use of aspirin: Secondary | ICD-10-CM

## 2014-09-22 DIAGNOSIS — I120 Hypertensive chronic kidney disease with stage 5 chronic kidney disease or end stage renal disease: Secondary | ICD-10-CM | POA: Diagnosis present

## 2014-09-22 DIAGNOSIS — N1330 Unspecified hydronephrosis: Secondary | ICD-10-CM | POA: Diagnosis not present

## 2014-09-22 DIAGNOSIS — Z992 Dependence on renal dialysis: Secondary | ICD-10-CM | POA: Diagnosis not present

## 2014-09-22 DIAGNOSIS — F4310 Post-traumatic stress disorder, unspecified: Secondary | ICD-10-CM | POA: Diagnosis present

## 2014-09-22 DIAGNOSIS — T82858D Stenosis of vascular prosthetic devices, implants and grafts, subsequent encounter: Secondary | ICD-10-CM | POA: Diagnosis not present

## 2014-09-22 DIAGNOSIS — Z7902 Long term (current) use of antithrombotics/antiplatelets: Secondary | ICD-10-CM | POA: Diagnosis not present

## 2014-09-22 DIAGNOSIS — D72829 Elevated white blood cell count, unspecified: Secondary | ICD-10-CM | POA: Diagnosis not present

## 2014-09-22 DIAGNOSIS — Y832 Surgical operation with anastomosis, bypass or graft as the cause of abnormal reaction of the patient, or of later complication, without mention of misadventure at the time of the procedure: Secondary | ICD-10-CM | POA: Diagnosis present

## 2014-09-22 DIAGNOSIS — N8320 Unspecified ovarian cysts: Secondary | ICD-10-CM | POA: Diagnosis present

## 2014-09-22 DIAGNOSIS — R52 Pain, unspecified: Secondary | ICD-10-CM

## 2014-09-22 DIAGNOSIS — Z95820 Peripheral vascular angioplasty status with implants and grafts: Secondary | ICD-10-CM | POA: Diagnosis not present

## 2014-09-22 DIAGNOSIS — R310 Gross hematuria: Secondary | ICD-10-CM | POA: Diagnosis present

## 2014-09-22 DIAGNOSIS — R1084 Generalized abdominal pain: Secondary | ICD-10-CM | POA: Diagnosis not present

## 2014-09-22 DIAGNOSIS — T82858A Stenosis of vascular prosthetic devices, implants and grafts, initial encounter: Secondary | ICD-10-CM | POA: Diagnosis present

## 2014-09-22 DIAGNOSIS — R109 Unspecified abdominal pain: Secondary | ICD-10-CM | POA: Diagnosis not present

## 2014-09-22 DIAGNOSIS — R319 Hematuria, unspecified: Secondary | ICD-10-CM | POA: Diagnosis not present

## 2014-09-22 DIAGNOSIS — E10319 Type 1 diabetes mellitus with unspecified diabetic retinopathy without macular edema: Secondary | ICD-10-CM | POA: Diagnosis present

## 2014-09-22 DIAGNOSIS — E1129 Type 2 diabetes mellitus with other diabetic kidney complication: Secondary | ICD-10-CM | POA: Diagnosis not present

## 2014-09-22 DIAGNOSIS — Z79899 Other long term (current) drug therapy: Secondary | ICD-10-CM | POA: Diagnosis not present

## 2014-09-22 DIAGNOSIS — D649 Anemia, unspecified: Secondary | ICD-10-CM | POA: Diagnosis present

## 2014-09-22 DIAGNOSIS — I871 Compression of vein: Secondary | ICD-10-CM | POA: Diagnosis not present

## 2014-09-22 DIAGNOSIS — E1029 Type 1 diabetes mellitus with other diabetic kidney complication: Secondary | ICD-10-CM | POA: Diagnosis not present

## 2014-09-22 DIAGNOSIS — T82898A Other specified complication of vascular prosthetic devices, implants and grafts, initial encounter: Secondary | ICD-10-CM | POA: Diagnosis not present

## 2014-09-22 DIAGNOSIS — E875 Hyperkalemia: Secondary | ICD-10-CM | POA: Diagnosis not present

## 2014-09-22 DIAGNOSIS — N949 Unspecified condition associated with female genital organs and menstrual cycle: Secondary | ICD-10-CM | POA: Diagnosis not present

## 2014-09-22 DIAGNOSIS — F329 Major depressive disorder, single episode, unspecified: Secondary | ICD-10-CM | POA: Diagnosis present

## 2014-09-22 DIAGNOSIS — N2889 Other specified disorders of kidney and ureter: Secondary | ICD-10-CM | POA: Diagnosis not present

## 2014-09-22 DIAGNOSIS — Z975 Presence of (intrauterine) contraceptive device: Secondary | ICD-10-CM | POA: Diagnosis not present

## 2014-09-22 DIAGNOSIS — D631 Anemia in chronic kidney disease: Secondary | ICD-10-CM | POA: Diagnosis not present

## 2014-09-22 DIAGNOSIS — N9489 Other specified conditions associated with female genital organs and menstrual cycle: Secondary | ICD-10-CM | POA: Diagnosis present

## 2014-09-22 HISTORY — DX: Pneumonia, unspecified organism: J18.9

## 2014-09-22 LAB — RENAL FUNCTION PANEL
Albumin: 3.8 g/dL (ref 3.5–5.0)
Anion gap: 17 — ABNORMAL HIGH (ref 5–15)
BUN: 24 mg/dL — ABNORMAL HIGH (ref 6–20)
CO2: 25 mmol/L (ref 22–32)
Calcium: 9.5 mg/dL (ref 8.9–10.3)
Chloride: 96 mmol/L — ABNORMAL LOW (ref 101–111)
Creatinine, Ser: 5.5 mg/dL — ABNORMAL HIGH (ref 0.44–1.00)
GFR calc Af Amer: 10 mL/min — ABNORMAL LOW (ref >60)
GFR calc non Af Amer: 9 mL/min — ABNORMAL LOW (ref >60)
Glucose, Bld: 228 mg/dL — ABNORMAL HIGH (ref 65–99)
Phosphorus: 5.7 mg/dL — ABNORMAL HIGH (ref 2.5–4.6)
Potassium: 4.3 mmol/L (ref 3.5–5.1)
Sodium: 138 mmol/L (ref 135–145)

## 2014-09-22 LAB — COMPREHENSIVE METABOLIC PANEL
ALK PHOS: 56 U/L (ref 38–126)
ALT: 8 U/L — ABNORMAL LOW (ref 14–54)
AST: 60 U/L — AB (ref 15–41)
Albumin: 3.5 g/dL (ref 3.5–5.0)
Anion gap: 17 — ABNORMAL HIGH (ref 5–15)
BILIRUBIN TOTAL: 2.3 mg/dL — AB (ref 0.3–1.2)
BUN: 66 mg/dL — ABNORMAL HIGH (ref 6–20)
CO2: 17 mmol/L — ABNORMAL LOW (ref 22–32)
Calcium: 8.8 mg/dL — ABNORMAL LOW (ref 8.9–10.3)
Chloride: 97 mmol/L — ABNORMAL LOW (ref 101–111)
Creatinine, Ser: 9.81 mg/dL — ABNORMAL HIGH (ref 0.44–1.00)
GFR calc Af Amer: 5 mL/min — ABNORMAL LOW (ref >60)
GFR calc non Af Amer: 4 mL/min — ABNORMAL LOW (ref >60)
Glucose, Bld: 308 mg/dL — ABNORMAL HIGH (ref 65–99)
POTASSIUM: 7.4 mmol/L — AB (ref 3.5–5.1)
Sodium: 131 mmol/L — ABNORMAL LOW (ref 135–145)
Total Protein: 7.4 g/dL (ref 6.5–8.1)

## 2014-09-22 LAB — URINALYSIS, ROUTINE W REFLEX MICROSCOPIC
Bilirubin Urine: NEGATIVE
GLUCOSE, UA: 500 mg/dL — AB
Ketones, ur: 15 mg/dL — AB
NITRITE: NEGATIVE
PROTEIN: 100 mg/dL — AB
Specific Gravity, Urine: 1.014 (ref 1.005–1.030)
Urobilinogen, UA: 0.2 mg/dL (ref 0.0–1.0)
pH: 8 (ref 5.0–8.0)

## 2014-09-22 LAB — URINE MICROSCOPIC-ADD ON

## 2014-09-22 LAB — CBC WITH DIFFERENTIAL/PLATELET
BAND NEUTROPHILS: 0 % (ref 0–10)
BASOS PCT: 0 % (ref 0–1)
Basophils Absolute: 0 10*3/uL (ref 0.0–0.1)
Blasts: 0 %
EOS ABS: 0 10*3/uL (ref 0.0–0.7)
EOS PCT: 0 % (ref 0–5)
HCT: 37.6 % (ref 36.0–46.0)
HEMOGLOBIN: 12.8 g/dL (ref 12.0–15.0)
Lymphocytes Relative: 12 % (ref 12–46)
Lymphs Abs: 1.7 10*3/uL (ref 0.7–4.0)
MCH: 30.9 pg (ref 26.0–34.0)
MCHC: 34 g/dL (ref 30.0–36.0)
MCV: 90.8 fL (ref 78.0–100.0)
METAMYELOCYTES PCT: 0 %
MONO ABS: 0 10*3/uL — AB (ref 0.1–1.0)
Monocytes Relative: 0 % — ABNORMAL LOW (ref 3–12)
Myelocytes: 0 %
NEUTROS ABS: 12.2 10*3/uL — AB (ref 1.7–7.7)
Neutrophils Relative %: 88 % — ABNORMAL HIGH (ref 43–77)
Other: 0 %
PLATELETS: 405 10*3/uL — AB (ref 150–400)
Promyelocytes Absolute: 0 %
RBC: 4.14 MIL/uL (ref 3.87–5.11)
RDW: 13.5 % (ref 11.5–15.5)
WBC: 13.9 10*3/uL — ABNORMAL HIGH (ref 4.0–10.5)
nRBC: 0 /100 WBC

## 2014-09-22 LAB — POC URINE PREG, ED: Preg Test, Ur: NEGATIVE

## 2014-09-22 LAB — GLUCOSE, CAPILLARY
Glucose-Capillary: 183 mg/dL — ABNORMAL HIGH (ref 65–99)
Glucose-Capillary: 214 mg/dL — ABNORMAL HIGH (ref 65–99)

## 2014-09-22 LAB — POTASSIUM
POTASSIUM: 6.4 mmol/L — AB (ref 3.5–5.1)
Potassium: 3.7 mmol/L (ref 3.5–5.1)

## 2014-09-22 LAB — LIPASE, BLOOD: Lipase: 35 U/L (ref 22–51)

## 2014-09-22 MED ORDER — SODIUM CHLORIDE 0.9 % IV SOLN: 100.0000 mL | INTRAVENOUS | Status: DC | PRN

## 2014-09-22 MED ORDER — HEPARIN SODIUM (PORCINE) 1000 UNIT/ML DIALYSIS: 1000.0000 [IU] | INTRAMUSCULAR | Status: DC | PRN

## 2014-09-22 MED ORDER — CHLORHEXIDINE GLUCONATE 0.12 % MT SOLN
15.0000 mL | Freq: Two times a day (BID) | OROMUCOSAL | Status: DC
  Administered 2014-09-23 – 2014-09-24 (×4): 15 mL via OROMUCOSAL
  Filled 2014-09-22 (×4): qty 15

## 2014-09-22 MED ORDER — SEVELAMER CARBONATE 800 MG PO TABS
4000.0000 mg | ORAL_TABLET | Freq: Three times a day (TID) | ORAL | Status: DC
  Administered 2014-09-24: 4000 mg via ORAL
  Filled 2014-09-22 (×2): qty 5

## 2014-09-22 MED ORDER — ALTEPLASE 2 MG IJ SOLR
2.0000 mg | Freq: Once | INTRAMUSCULAR | Status: AC | PRN
  Filled 2014-09-22: qty 2

## 2014-09-22 MED ORDER — CINACALCET HCL 30 MG PO TABS
60.0000 mg | ORAL_TABLET | Freq: Every day | ORAL | Status: DC
  Administered 2014-09-22 – 2014-09-24 (×2): 60 mg via ORAL
  Filled 2014-09-22 (×3): qty 2

## 2014-09-22 MED ORDER — ALUM & MAG HYDROXIDE-SIMETH 200-200-20 MG/5ML PO SUSP: 30.0000 mL | Freq: Four times a day (QID) | ORAL | Status: DC | PRN

## 2014-09-22 MED ORDER — SODIUM CHLORIDE 0.9 % IV SOLN
INTRAVENOUS | Status: DC
  Administered 2014-09-22: 21:00:00 via INTRAVENOUS

## 2014-09-22 MED ORDER — ASPIRIN 81 MG PO TABS: 81.0000 mg | ORAL_TABLET | Freq: Every day | ORAL | Status: DC

## 2014-09-22 MED ORDER — PREDNISONE 50 MG PO TABS
50.0000 mg | ORAL_TABLET | Freq: Four times a day (QID) | ORAL | Status: AC
  Administered 2014-09-22 – 2014-09-23 (×3): 50 mg via ORAL
  Filled 2014-09-22 (×3): qty 1

## 2014-09-22 MED ORDER — ONDANSETRON HCL 4 MG/2ML IJ SOLN
4.0000 mg | Freq: Four times a day (QID) | INTRAMUSCULAR | Status: DC | PRN
Start: 2014-09-22 — End: 2014-09-24

## 2014-09-22 MED ORDER — ACETAMINOPHEN 650 MG RE SUPP: 650.0000 mg | Freq: Four times a day (QID) | RECTAL | Status: DC | PRN

## 2014-09-22 MED ORDER — ACETAMINOPHEN 325 MG PO TABS: 650.0000 mg | ORAL_TABLET | Freq: Four times a day (QID) | ORAL | Status: DC | PRN

## 2014-09-22 MED ORDER — HYDROCORTISONE NA SUCCINATE PF 100 MG IJ SOLR
200.0000 mg | Freq: Once | INTRAMUSCULAR | Status: AC
  Administered 2014-09-22: 200 mg via INTRAVENOUS
  Filled 2014-09-22: qty 4

## 2014-09-22 MED ORDER — SODIUM CHLORIDE 0.9 % IJ SOLN
3.0000 mL | Freq: Two times a day (BID) | INTRAMUSCULAR | Status: DC
  Administered 2014-09-24: 3 mL via INTRAVENOUS

## 2014-09-22 MED ORDER — ONDANSETRON HCL 4 MG PO TABS
4.0000 mg | ORAL_TABLET | Freq: Four times a day (QID) | ORAL | Status: DC | PRN
Start: 2014-09-22 — End: 2014-09-24

## 2014-09-22 MED ORDER — DIPHENHYDRAMINE HCL 25 MG PO CAPS
50.0000 mg | ORAL_CAPSULE | Freq: Once | ORAL | Status: AC
  Administered 2014-09-22: 50 mg via ORAL
  Filled 2014-09-22: qty 2

## 2014-09-22 MED ORDER — PENTAFLUOROPROP-TETRAFLUOROETH EX AERO: 1.0000 "application " | INHALATION_SPRAY | CUTANEOUS | Status: DC | PRN

## 2014-09-22 MED ORDER — NEPRO/CARBSTEADY PO LIQD: 237.0000 mL | ORAL | Status: DC | PRN

## 2014-09-22 MED ORDER — LIDOCAINE-PRILOCAINE 2.5-2.5 % EX CREA
1.0000 "application " | TOPICAL_CREAM | Freq: Every day | CUTANEOUS | Status: DC | PRN
  Filled 2014-09-22: qty 5

## 2014-09-22 MED ORDER — MORPHINE SULFATE 4 MG/ML IJ SOLN
6.0000 mg | Freq: Once | INTRAMUSCULAR | Status: AC
  Administered 2014-09-22: 6 mg via INTRAVENOUS
  Filled 2014-09-22: qty 2

## 2014-09-22 MED ORDER — CINACALCET HCL 30 MG PO TABS: 60.0000 mg | ORAL_TABLET | Freq: Every day | ORAL | Status: DC

## 2014-09-22 MED ORDER — HEPARIN SODIUM (PORCINE) 5000 UNIT/ML IJ SOLN
5000.0000 [IU] | Freq: Three times a day (TID) | INTRAMUSCULAR | Status: DC
  Administered 2014-09-22 – 2014-09-24 (×5): 5000 [IU] via SUBCUTANEOUS
  Filled 2014-09-22: qty 1

## 2014-09-22 MED ORDER — DIPHENHYDRAMINE HCL 25 MG PO CAPS
50.0000 mg | ORAL_CAPSULE | Freq: Once | ORAL | Status: AC
  Administered 2014-09-23: 50 mg via ORAL
  Filled 2014-09-22: qty 2

## 2014-09-22 MED ORDER — CLOPIDOGREL BISULFATE 75 MG PO TABS
75.0000 mg | ORAL_TABLET | Freq: Every day | ORAL | Status: DC
Start: 2014-09-22 — End: 2014-09-24
  Administered 2014-09-22 – 2014-09-24 (×3): 75 mg via ORAL
  Filled 2014-09-22 (×3): qty 1

## 2014-09-22 MED ORDER — CETYLPYRIDINIUM CHLORIDE 0.05 % MT LIQD
7.0000 mL | Freq: Two times a day (BID) | OROMUCOSAL | Status: DC
  Administered 2014-09-23: 7 mL via OROMUCOSAL

## 2014-09-22 MED ORDER — LIDOCAINE-PRILOCAINE 2.5-2.5 % EX CREA
1.0000 "application " | TOPICAL_CREAM | CUTANEOUS | Status: DC | PRN
  Filled 2014-09-22: qty 5

## 2014-09-22 MED ORDER — ALBUTEROL SULFATE (2.5 MG/3ML) 0.083% IN NEBU: 3.0000 mL | INHALATION_SOLUTION | Freq: Four times a day (QID) | RESPIRATORY_TRACT | Status: DC | PRN

## 2014-09-22 MED ORDER — CALCIUM ACETATE (PHOS BINDER) 667 MG PO CAPS
667.0000 mg | ORAL_CAPSULE | Freq: Three times a day (TID) | ORAL | Status: DC
  Administered 2014-09-24: 667 mg via ORAL
  Filled 2014-09-22 (×2): qty 1

## 2014-09-22 MED ORDER — ASPIRIN 81 MG PO CHEW
81.0000 mg | CHEWABLE_TABLET | Freq: Every day | ORAL | Status: DC
  Administered 2014-09-22 – 2014-09-24 (×3): 81 mg via ORAL
  Filled 2014-09-22 (×3): qty 1

## 2014-09-22 MED ORDER — LIDOCAINE HCL (PF) 1 % IJ SOLN: 5.0000 mL | INTRAMUSCULAR | Status: DC | PRN

## 2014-09-22 MED ORDER — PIOGLITAZONE HCL 15 MG PO TABS
15.0000 mg | ORAL_TABLET | Freq: Every day | ORAL | Status: DC
  Administered 2014-09-22 – 2014-09-24 (×3): 15 mg via ORAL
  Filled 2014-09-22 (×4): qty 1

## 2014-09-22 MED ORDER — HEPARIN SODIUM (PORCINE) 1000 UNIT/ML DIALYSIS: 20.0000 [IU]/kg | INTRAMUSCULAR | Status: DC | PRN

## 2014-09-22 MED ORDER — INSULIN ASPART 100 UNIT/ML ~~LOC~~ SOLN
0.0000 [IU] | Freq: Three times a day (TID) | SUBCUTANEOUS | Status: DC
Start: 2014-09-23 — End: 2014-09-24
  Administered 2014-09-23: 7 [IU] via SUBCUTANEOUS
  Administered 2014-09-23: 11 [IU] via SUBCUTANEOUS
  Administered 2014-09-24: 3 [IU] via SUBCUTANEOUS

## 2014-09-22 NOTE — H&P (Addendum)
History and Physical  Morgan [name]:[id number] DOB: 02-11-1971 DOA: 09/22/2014  Referring physician: Dr Winfred Leeds, ED physician PCP: [name], NP   Chief Complaint: Abdominal pain, diarrhea  HPI: Morgan [name] is a 44 y.o. female  With a history of end-stage renal disease on dialysis, diabetes mellitus, hypertension.  Patient has a partial occlusion of her graft and was supposed to undergo a procedure by interventional radiology today to correct this. Because of her allergy to IV contrast, the patient was undergoing premedication with prednisone yesterday. The patient started having severe constant central abdominal pain yesterday with multiple episodes of nonbloody, watery diarrhea. The patient presented to the emergency department this morning for evaluation. Her pain radiates into her lower abdomen. She denies palliating or provoking factors.  Emergency department she was noted to have hyperkalemia, although she currently has no EKG changes. She is on dialysis and has her dialysis Tuesday, Thursday, and Saturdays. She did have dialysis yesterday, however only spent 3 hours at dialysis.  Review of Systems:   Pt denies any fevers, chills, nausea, vomiting, chest pain, palpitations, rectal bleeding, melena, headache, blurred vision, shortness of breath.  Review of systems are otherwise negative  Past Medical History  Diagnosis Date  . Diabetes mellitus without complication   . Renal disorder   . Hypertension    Past Surgical History  Procedure Laterality Date  . Cesarean section    . Eye surgery      retinal detachment   Social History:  reports that she has never smoked. She has never used smokeless tobacco. She reports that she does not drink alcohol or use illicit drugs. Patient lives at home & is able to participate in activities of daily living  Allergies  Allergen Reactions  . Iohexol      Code: HIVES, Desc: ITCHING, HIVES, SWELLING- 13 HR  PRE-MEDS REQUIRED- ASM, Onset Date: 31517616   . Latex     unknown    Family History  Problem Relation Age of Onset  . Diabetes Maternal Grandmother       Prior to Admission medications   Medication Sig Start Date End Date Taking? Authorizing Provider  albuterol (PROVENTIL HFA;VENTOLIN HFA) 108 (90 BASE) MCG/ACT inhaler Inhale 1-2 puffs into the lungs every 6 (six) hours as needed for wheezing or shortness of breath.   Yes Historical Provider, MD  aspirin 81 MG tablet Take 81 mg by mouth daily.   Yes Historical Provider, MD  B Complex-C-Folic Acid (DIALYVITE TABLET) TABS Take 1 tablet by mouth daily at 12 noon.   Yes Historical Provider, MD  calcium acetate (PHOSLO) 667 MG capsule Take 667 mg by mouth 3 (three) times daily with meals.    Yes Historical Provider, MD  cinacalcet (SENSIPAR) 60 MG tablet Take 60 mg by mouth daily.   Yes Historical Provider, MD  clopidogrel (PLAVIX) 75 MG tablet Take 75 mg by mouth daily.   Yes Historical Provider, MD  glucose blood (ONE TOUCH ULTRA TEST) test strip 1 each by Other route 2 (two) times daily. And lancets 2/day 04/05/14  Yes Renato Shin, MD  insulin lispro (HUMALOG KWIKPEN) 100 UNIT/ML KiwkPen Inject 0.1 mLs (10 Units total) into the skin 3 (three) times daily with meals. And pen needles 3/day 04/05/14  Yes Renato Shin, MD  Insulin Pen Needle (DROPLET PEN NEEDLES) 31G X 5 MM MISC Use to inject insulin 3 times per day 09/10/14  Yes Renato Shin, MD  levonorgestrel (MIRENA) 20 MCG/24HR IUD 1 each by Intrauterine  route once.   Yes Historical Provider, MD  lidocaine-prilocaine (EMLA) cream Apply 1 application topically daily as needed (dialysis).    Yes Historical Provider, MD  pioglitazone (ACTOS) 15 MG tablet Take 15 mg by mouth daily.   Yes Historical Provider, MD  sevelamer carbonate (RENVELA) 800 MG tablet Take 4,000-4,800 mg by mouth 3 (three) times daily with meals.    Yes Historical Provider, MD    Physical Exam: BP 145/59 mmHg  Pulse 81   Temp(Src) 98.2 F (36.8 C) (Oral)  Resp 14  Ht '5\' 2"'  (1.575 m)  Wt 75.5 kg (166 lb 7.2 oz)  BMI 30.44 kg/m2  SpO2 94%  General: Middle-aged black female. Awake and alert and oriented x3. No acute cardiopulmonary distress.  Eyes: Pupils equal, round, reactive to light. Extraocular muscles are intact. Sclerae anicteric and noninjected.  ENT:  Moist mucosal membranes. No mucosal lesions.   Neck: Neck supple without lymphadenopathy. No carotid bruits. No masses palpated.  Cardiovascular: Regular rate with normal S1-S2 sounds. No murmurs, rubs, gallops auscultated. No JVD.  Respiratory: Good respiratory effort with no wheezes, rales, rhonchi. Lungs clear to auscultation bilaterally.  Abdomen: Soft, diffusely tender to palpation particularly periumbilically and in the lower abdomen, nondistended. Active bowel sounds. No masses or hepatosplenomegaly  Skin: Dry, warm to touch. 2+ dorsalis pedis and radial pulses. Musculoskeletal: No calf or leg pain. All major joints not erythematous nontender.  Psychiatric: Intact judgment and insight.  Neurologic: No focal neurological deficits. Cranial nerves II through XII are grossly intact.           Labs on Admission:  Basic Metabolic Panel:  Recent Labs Lab 09/22/14 1350 09/22/14 1512  NA 131*  --   K 7.4* 6.4*  CL 97*  --   CO2 17*  --   GLUCOSE 308*  --   BUN 66*  --   CREATININE 9.81*  --   CALCIUM 8.8*  --    Liver Function Tests:  Recent Labs Lab 09/22/14 1350  AST 60*  ALT 8*  ALKPHOS 56  BILITOT 2.3*  PROT 7.4  ALBUMIN 3.5    Recent Labs Lab 09/22/14 1350  LIPASE 35   No results for input(s): AMMONIA in the last 168 hours. CBC:  Recent Labs Lab 09/22/14 1350  WBC 13.9*  NEUTROABS 12.2*  HGB 12.8  HCT 37.6  MCV 90.8  PLT 405*   Cardiac Enzymes: No results for input(s): CKTOTAL, CKMB, CKMBINDEX, TROPONINI in the last 168 hours.  BNP (last 3 results) No results for input(s): BNP in the last 8760  hours.  ProBNP (last 3 results) No results for input(s): PROBNP in the last 8760 hours.  CBG: No results for input(s): GLUCAP in the last 168 hours.  Radiological Exams on Admission: Dg Abd Acute W/chest  09/22/2014   CLINICAL DATA:  Central and right-sided abdominal pain since last night with radiation to back. Dialysis patient. History of hypertension and diabetes.  EXAM: DG ABDOMEN ACUTE W/ 1V CHEST  COMPARISON:  None.  FINDINGS: Single view of the chest: Cardiomediastinal silhouette is within normal limits in size and configuration. Lungs are clear. No evidence of pneumonia. No pleural effusion. No pneumothorax. Axillary/subclavian vessels stents noted on the left. No osseous abnormality.  Decubitus and supine views of the abdomen: Bowel gas pattern is nonobstructive. No evidence of free intraperitoneal air. No evidence of soft tissue mass or abnormal fluid collection. No pathologic -appearing calcifications. Intrauterine device noted within the central pelvis. No osseous abnormality.  IMPRESSION: No evidence of acute cardiopulmonary abnormality.  Lungs are clear.  Nonobstructive bowel gas pattern and no evidence of acute intra-abdominal abnormality.   Electronically Signed   By: Franki Cabot M.D.   On: 09/22/2014 14:32    EKG: Independently reviewed. Sinus rhythm with a ventricular rate of 89. Normal PR and QRS intervals. Slightly prolonged QTC at 0.492. No acute ST elevation or depression. No evidence of STEMI  Assessment/Plan Present on Admission:  . Leukocytosis . Hyperkalemia . Abdominal pain . Diarrhea . 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  We also discussed care given by the ED provider.  #1 abdominal pain  Due to inability to obtain CAT scan and the [facility] department will admit to telemetry, premedicate with steroids and Benadryl  and obtain CAT scan tomorrow to look for colitis  We'll hold nothing by mouth for now  As the patient is not septic or have evidence of cardiovascular instability, we'll hold off on empiric treatment with antibiotics until results of the CT obtained  IV fluids with light hydration at 75 mL per hour #2 hyperkalemia  Hemodialysis tonight - nephrology consulted  Check metabolic panel in the morning #3 leukocytosis  Likely secondary to steroids  Check CBC in the morning #4 diarrhea  Symptomatic treatment #5 diabetes mellitus  Continue Actos  CBG every 4 hours  Sliding Scale insulin #6 end-stage renal disease on dialysis #7 Graft partial occlusion  Interventional radiology will need to be consulted in the morning to see if they're able to do the procedure well the patient is inpatient    DVT prophylaxis: Heparin  Consultants: Nephrology  Code Status: Full code  Family Communication: None   Disposition Plan: Pending   Truett Mainland, DO [facility] Pager [phone]

## 2014-09-22 NOTE — ED Notes (Signed)
Family at bedside. 

## 2014-09-22 NOTE — Consult Note (Signed)
[facility] Renal Consultation Note    Indication for Consultation:  Management of ESRD/hemodialysis; anemia, hypertension/volume and secondary hyperparathyroidism PCP:  HPI: Morgan [name] is a 44 y.o. female with ESRD since 04/2010 who has hemodialysis TTS at [facility]. Past medical history significant for DM, HTN, retinopathy, Depression, PTSD.  Patient presented to [facility] ED today with C/O Abdominal pain she describes as starting in subxiphoid area and radiatating to back with watery diarrhea last night.  Patient was scheduled today for fistulogram at [facility] for decreased AF. Patient was taking prednisone prior to procedure due to Iohexol. She said she waited at [facility] and couldn't stand the pain any longer and came to ED. Upon arrival to ED,  Serum Potassium was found to be initially 7.0. Repeat serum K+6.4.  She was brought to hemodialysis for urgent dialysis.  Patient denies fever, + chills, dizziness, N,V today. No headaches, joint aches, chest pain/SOB. Patient states that she took nothing for abdominal pain and that the only thing that improved pain was morphine given earlier in ED department.  Patient has HD at [facility]. Patient has signed off hemodialysis early last 7 treatments. Last Lab Values are as follows: Hgb 13.2, Ca 9.2 C Ca 9.3 Phos 6.9 PTH 293.   Past Medical History  Diagnosis Date  . Diabetes mellitus without complication   . Renal disorder   . Hypertension    Past Surgical History  Procedure Laterality Date  . Cesarean section    . Eye surgery      retinal detachment   Family History  Problem Relation Age of Onset  . Diabetes Maternal Grandmother    Social History:  reports that she has never smoked. She has never used smokeless tobacco. She reports that she does not drink alcohol or use illicit drugs. Allergies  Allergen Reactions  . Iohexol      Code: HIVES, Desc:  ITCHING, HIVES, SWELLING- 13 HR PRE-MEDS REQUIRED- ASM, Onset Date: 10315945   . Latex     unknown   Prior to Admission medications   Medication Sig Start Date End Date Taking? Authorizing Provider  albuterol (PROVENTIL HFA;VENTOLIN HFA) 108 (90 BASE) MCG/ACT inhaler Inhale 1-2 puffs into the lungs every 6 (six) hours as needed for wheezing or shortness of breath.   Yes Historical Provider, MD  aspirin 81 MG tablet Take 81 mg by mouth daily.   Yes Historical Provider, MD  B Complex-C-Folic Acid (DIALYVITE TABLET) TABS Take 1 tablet by mouth daily at 12 noon.   Yes Historical Provider, MD  calcium acetate (PHOSLO) 667 MG capsule Take 667 mg by mouth 3 (three) times daily with meals.    Yes Historical Provider, MD  cinacalcet (SENSIPAR) 60 MG tablet Take 60 mg by mouth daily.   Yes Historical Provider, MD  clopidogrel (PLAVIX) 75 MG tablet Take 75 mg by mouth daily.   Yes Historical Provider, MD  glucose blood (ONE TOUCH ULTRA TEST) test strip 1 each by Other route 2 (two) times daily. And lancets 2/day 04/05/14  Yes Renato Shin, MD  insulin lispro (HUMALOG KWIKPEN) 100 UNIT/ML KiwkPen Inject 0.1 mLs (10 Units total) into the skin 3 (three) times daily with meals. And pen needles 3/day 04/05/14  Yes Renato Shin, MD  Insulin Pen Needle (DROPLET PEN NEEDLES) 31G X 5 MM MISC Use to inject insulin 3 times per day 09/10/14  Yes Renato Shin, MD  levonorgestrel (MIRENA) 20 MCG/24HR IUD 1 each by Intrauterine  route once.   Yes Historical Provider, MD  lidocaine-prilocaine (EMLA) cream Apply 1 application topically daily as needed (dialysis).    Yes Historical Provider, MD  pioglitazone (ACTOS) 15 MG tablet Take 15 mg by mouth daily.   Yes Historical Provider, MD  sevelamer carbonate (RENVELA) 800 MG tablet Take 4,000-4,800 mg by mouth 3 (three) times daily with meals.    Yes Historical Provider, MD   No current facility-administered medications for this encounter.   Current Outpatient Prescriptions   Medication Sig Dispense Refill  . albuterol (PROVENTIL HFA;VENTOLIN HFA) 108 (90 BASE) MCG/ACT inhaler Inhale 1-2 puffs into the lungs every 6 (six) hours as needed for wheezing or shortness of breath.    Marland Kitchen aspirin 81 MG tablet Take 81 mg by mouth daily.    . B Complex-C-Folic Acid (DIALYVITE TABLET) TABS Take 1 tablet by mouth daily at 12 noon.    . calcium acetate (PHOSLO) 667 MG capsule Take 667 mg by mouth 3 (three) times daily with meals.     . cinacalcet (SENSIPAR) 60 MG tablet Take 60 mg by mouth daily.    . clopidogrel (PLAVIX) 75 MG tablet Take 75 mg by mouth daily.    Marland Kitchen glucose blood (ONE TOUCH ULTRA TEST) test strip 1 each by Other route 2 (two) times daily. And lancets 2/day 100 each 12  . insulin lispro (HUMALOG KWIKPEN) 100 UNIT/ML KiwkPen Inject 0.1 mLs (10 Units total) into the skin 3 (three) times daily with meals. And pen needles 3/day 15 mL 11  . Insulin Pen Needle (DROPLET PEN NEEDLES) 31G X 5 MM MISC Use to inject insulin 3 times per day 300 each 2  . levonorgestrel (MIRENA) 20 MCG/24HR IUD 1 each by Intrauterine route once.    . lidocaine-prilocaine (EMLA) cream Apply 1 application topically daily as needed (dialysis).     . pioglitazone (ACTOS) 15 MG tablet Take 15 mg by mouth daily.    . sevelamer carbonate (RENVELA) 800 MG tablet Take 4,000-4,800 mg by mouth 3 (three) times daily with meals.      Labs: Basic Metabolic Panel:  Recent Labs Lab 09/22/14 1350 09/22/14 1512  NA 131*  --   K 7.4* 6.4*  CL 97*  --   CO2 17*  --   GLUCOSE 308*  --   BUN 66*  --   CREATININE 9.81*  --   CALCIUM 8.8*  --    Liver Function Tests:  Recent Labs Lab 09/22/14 1350  AST 60*  ALT 8*  ALKPHOS 56  BILITOT 2.3*  PROT 7.4  ALBUMIN 3.5    Recent Labs Lab 09/22/14 1350  LIPASE 35   No results for input(s): AMMONIA in the last 168 hours. CBC:  Recent Labs Lab 09/22/14 1350  WBC 13.9*  NEUTROABS 12.2*  HGB 12.8  HCT 37.6  MCV 90.8  PLT 405*   Cardiac  Enzymes: No results for input(s): CKTOTAL, CKMB, CKMBINDEX, TROPONINI in the last 168 hours. CBG: No results for input(s): GLUCAP in the last 168 hours. Iron Studies: No results for input(s): IRON, TIBC, TRANSFERRIN, FERRITIN in the last 72 hours. Studies/Results: Dg Abd Acute W/chest  09/22/2014   CLINICAL DATA:  Central and right-sided abdominal pain since last night with radiation to back. Dialysis patient. History of hypertension and diabetes.  EXAM: DG ABDOMEN ACUTE W/ 1V CHEST  COMPARISON:  None.  FINDINGS: Single view of the chest: Cardiomediastinal silhouette is within normal limits in size and configuration. Lungs are clear. No evidence  of pneumonia. No pleural effusion. No pneumothorax. Axillary/subclavian vessels stents noted on the left. No osseous abnormality.  Decubitus and supine views of the abdomen: Bowel gas pattern is nonobstructive. No evidence of free intraperitoneal air. No evidence of soft tissue mass or abnormal fluid collection. No pathologic -appearing calcifications. Intrauterine device noted within the central pelvis. No osseous abnormality.  IMPRESSION: No evidence of acute cardiopulmonary abnormality.  Lungs are clear.  Nonobstructive bowel gas pattern and no evidence of acute intra-abdominal abnormality.   Electronically Signed   By: Franki Cabot M.D.   On: 09/22/2014 14:32    ROS: As per HPI otherwise negative.   Physical Exam: Filed Vitals:   09/22/14 1126 09/22/14 1400 09/22/14 1555 09/22/14 1600  BP: 181/64 144/68 122/102 145/59  Pulse: 79 83 77 81  Temp: 98.2 F (36.8 C)     TempSrc: Oral     Resp: '18  12 14  ' Height: '5\' 2"'  (1.575 m)     Weight: 75.5 kg (166 lb 7.2 oz)     SpO2: 100% 98% 95% 94%     General: Well developed, well nourished, in no acute distress. Head: Normocephalic, atraumatic, sclera non-icteric, mucus membranes are moist Neck: Supple. JVD not elevated. Lungs: Clear bilaterally to auscultation without wheezes, rales, or rhonchi.  Breathing is unlabored. Heart: RRR with S1 S2. No murmurs, rubs, or gallops appreciated. Abdomen: Soft, tender in across RUQ, RLQ LLQ. Normoactive BS.  No rebound/guarding. No obvious abdominal masses. M-S:  Strength and tone appear normal for age. Lower extremities:without edema or ischemic changes, no open wounds  Neuro: Alert and oriented X 3. Moves all extremities spontaneously. Psych:  Responds to questions appropriately with a normal affect. Dialysis Access: LUA AVG + Thrill  Dialysis Orders: Center: [facility] on TTS . EDW 75.5 KG HD Bath 2.0 K 2.0 K 1 mg   Time 4 hours Heparin 3500 Units. Access LUA AVG BFR 400 DFR Autoflow 1.5    Hectoral 4 mcg IV T,T,S (last dose 0726/16)   Assessment/Plan: 1.  Hyperkalemia:  On HD at present on 1.0 K bath. Recheck K+ in 1 hour. Will have HD for 3 hours. 12 lead EKG normal. 2.  Decreased AF in AVG: AF 1702 on 08/28/14 Repeat AF 470 (09/18/14) and 452 (09/21/14). Had apt with Dr. Augustin Coupe today for graftogram.  If patient stays in hospital will consult [facility] to see pt. 3.  Abdominal Pain: Per primary-will have CT of abdomen after HD.  4.  ESRD - T,T,S at [facility].  Heparin 3500 units at center. No heparin tonight. 5.  Hypertension/volume  - OP EDW 75.5 kg. Current wt 79.5. Will UF for 2 liters. SBP 145-181. No antihypertensive drugs prescribed. 6.  Anemia  - HGB 12.8 No ESAs. Follow CBCs.  7.  Metabolic bone disease -  Ca 8.8 C Ca 9.2 Continue out patient binders.  8.  Nutrition - NPO at present.  9.  DM/Hyperglycemia: BS in ED 308 now 202. Probably R/T prednisone. SS per primary.    Marcin Holte H. Owens Shark, NP-C 09/22/2014, 5:16 PM  [employer] [phone]

## 2014-09-22 NOTE — ED Notes (Addendum)
Patient states she started having abdominal pain last night with radiation to back.  Patient states did have some diarrhea and nausea.  Patient denies any vomiting.   Patient denies urine problems at this time. Dialysis patient Cain Saupe, and Saturday.   Patient states she did have dialysis yesterday.

## 2014-09-22 NOTE — ED Notes (Signed)
IV team at the bedside. 

## 2014-09-22 NOTE — Progress Notes (Signed)
Patient was seen on dialysis and the procedure was supervised.   BFR 350 Via LUA loop AVG  BP is 111/57.  AP -170   VP 190 UF 3L Bath 2/2.5  Patient appears to be tolerating treatment well. Certainly feeling more comfortable. Denies any nausea, dizziness or cramping.  Otelia Santee, MD 09/22/2014, 8:29 PM

## 2014-09-22 NOTE — ED Notes (Signed)
EDP informed about potassium level

## 2014-09-22 NOTE — ED Provider Notes (Signed)
CSN: [id number]     Arrival date & time 09/22/14  1052 History   First MD Initiated Contact with Patient 09/22/14 1225     Chief Complaint  Patient presents with  . Abdominal Pain  . Back Pain     (Consider location/radiation/quality/duration/timing/severity/associated sxs/prior Treatment) HPI Patient complain of abdominal pain, diffuse rating tobacco onset 7 PM yesterday. Symptoms are coming by 2 episodes of nonbloody nonbilious diarrhea. No other associated symptoms. She denies nausea or vomiting. No fever. No other associated symptoms. Pain got worse progressively throughout today. Pain is severe constant and diffuse she's not had pain similar to this before. No treatment prior to coming here. No other associated symptoms. Past Medical History  Diagnosis Date  . Diabetes mellitus without complication   . Renal disorder   . Hypertension    Past Surgical History  Procedure Laterality Date  . Cesarean section    . Eye surgery      retinal detachment   Family History  Problem Relation Age of Onset  . Diabetes Maternal Grandmother    History  Substance Use Topics  . Smoking status: Never Smoker   . Smokeless tobacco: Never Used  . Alcohol Use: No   OB History    No data available     Review of Systems  Gastrointestinal: Positive for abdominal pain and diarrhea.  Genitourinary:       Hemodialysis patient. Receives hemodialysis Tuesdays Thursdays Saturdays. Urinates approximately twice per day  All other systems reviewed and are negative.     Allergies  Iohexol and Latex  Home Medications   Prior to Admission medications   Medication Sig Start Date End Date Taking? Authorizing Provider  albuterol (PROVENTIL HFA;VENTOLIN HFA) 108 (90 BASE) MCG/ACT inhaler Inhale 1-2 puffs into the lungs every 6 (six) hours as needed for wheezing or shortness of breath.    Historical Provider, MD  aspirin 81 MG tablet Take 81 mg by mouth daily.    Historical Provider, MD  B  Complex-C-Folic Acid (DIALYVITE TABLET) TABS Take 1 tablet by mouth daily at 12 noon.    Historical Provider, MD  calcium acetate (PHOSLO) 667 MG capsule Take 667 mg by mouth 3 (three) times daily with meals.     Historical Provider, MD  cinacalcet (SENSIPAR) 60 MG tablet Take 60 mg by mouth daily.    Historical Provider, MD  clopidogrel (PLAVIX) 75 MG tablet Take 75 mg by mouth daily.    Historical Provider, MD  glucose blood (ONE TOUCH ULTRA TEST) test strip 1 each by Other route 2 (two) times daily. And lancets 2/day 04/05/14   Renato Shin, MD  insulin lispro (HUMALOG KWIKPEN) 100 UNIT/ML KiwkPen Inject 0.1 mLs (10 Units total) into the skin 3 (three) times daily with meals. And pen needles 3/day 04/05/14   Renato Shin, MD  Insulin Pen Needle (DROPLET PEN NEEDLES) 31G X 5 MM MISC Use to inject insulin 3 times per day 09/10/14   Renato Shin, MD  levonorgestrel (MIRENA) 20 MCG/24HR IUD 1 each by Intrauterine route once.    Historical Provider, MD  lidocaine-prilocaine (EMLA) cream Apply 1 application topically daily as needed (dialysis).     Historical Provider, MD  pioglitazone (ACTOS) 15 MG tablet Take 15 mg by mouth daily.    Historical Provider, MD  sevelamer carbonate (RENVELA) 800 MG tablet Take 4,000-4,800 mg by mouth 3 (three) times daily with meals.     Historical Provider, MD   BP 181/64 mmHg  Pulse 79  Temp(Src)  98.2 F (36.8 C) (Oral)  Resp 18  Ht 5\' 2"  (1.575 m)  Wt 166 lb 7.2 oz (75.5 kg)  BMI 30.44 kg/m2  SpO2 100% Physical Exam  Constitutional: She is oriented to person, place, and time. She appears well-developed and well-nourished. She appears distressed.  Years uncomfortable Glasgow Coma Score 15  HENT:  Head: Normocephalic and atraumatic.  Eyes: Conjunctivae are normal. Pupils are equal, round, and reactive to light.  Neck: Neck supple. No tracheal deviation present. No thyromegaly present.  Cardiovascular: Normal rate and regular rhythm.   No murmur  heard. Pulmonary/Chest: Effort normal and breath sounds normal.  Abdominal: Soft. Bowel sounds are normal. She exhibits no distension and no mass. There is tenderness. There is no rebound and no guarding.  Diffusely tender  Genitourinary:  Bilateral flank tenderness  Musculoskeletal: Normal range of motion. She exhibits no edema or tenderness.  Left upper extremity with graft with good thrill  Neurological: She is alert and oriented to person, place, and time. Coordination normal.  Skin: Skin is warm and dry. No rash noted.  Psychiatric: She has a normal mood and affect.  Nursing note and vitals reviewed.   ED Course  Procedures (including critical care time) Labs Review Labs Reviewed  CBC WITH DIFFERENTIAL/PLATELET  COMPREHENSIVE METABOLIC PANEL  LIPASE, BLOOD  URINALYSIS, ROUTINE W REFLEX MICROSCOPIC (NOT AT [facility])    Imaging Review No results found.   EKG Interpretation   Date/Time:  Wednesday September 22 2014 14:52:12 EDT Ventricular Rate:  89 PR Interval:  131 QRS Duration: 72 QT Interval:  404 QTC Calculation: 492 R Axis:   29 Text Interpretation:  Sinus rhythm Low voltage, precordial leads  Borderline prolonged QT interval Baseline wander in lead(s) V6 No  significant change since last tracing Confirmed by Winfred Leeds  MD, Massa Pe  [phone]) on 09/22/2014 3:57:54 PM     4:30 PM patient resting comfortably after treatment with intravenous opioids Acute Abdominal series x-rays viewed by me Results for orders placed or performed during the hospital encounter of 09/22/14  CBC WITH DIFFERENTIAL  Result Value Ref Range   WBC 13.9 (H) 4.0 - 10.5 K/uL   RBC 4.14 3.87 - 5.11 MIL/uL   Hemoglobin 12.8 12.0 - 15.0 g/dL   HCT 37.6 36.0 - 46.0 %   MCV 90.8 78.0 - 100.0 fL   MCH 30.9 26.0 - 34.0 pg   MCHC 34.0 30.0 - 36.0 g/dL   RDW 13.5 11.5 - 15.5 %   Platelets 405 (H) 150 - 400 K/uL   Neutrophils Relative % 88 (H) 43 - 77 %   Lymphocytes Relative 12 12 - 46 %   Monocytes  Relative 0 (L) 3 - 12 %   Eosinophils Relative 0 0 - 5 %   Basophils Relative 0 0 - 1 %   Band Neutrophils 0 0 - 10 %   Metamyelocytes Relative 0 %   Myelocytes 0 %   Promyelocytes Absolute 0 %   Blasts 0 %   nRBC 0 0 /100 WBC   Other 0 %   Neutro Abs 12.2 (H) 1.7 - 7.7 K/uL   Lymphs Abs 1.7 0.7 - 4.0 K/uL   Monocytes Absolute 0.0 (L) 0.1 - 1.0 K/uL   Eosinophils Absolute 0.0 0.0 - 0.7 K/uL   Basophils Absolute 0.0 0.0 - 0.1 K/uL   Smear Review MORPHOLOGY UNREMARKABLE   Comprehensive metabolic panel  Result Value Ref Range   Sodium 131 (L) 135 - 145 mmol/L   Potassium  7.4 (HH) 3.5 - 5.1 mmol/L   Chloride 97 (L) 101 - 111 mmol/L   CO2 17 (L) 22 - 32 mmol/L   Glucose, Bld 308 (H) 65 - 99 mg/dL   BUN 66 (H) 6 - 20 mg/dL   Creatinine, Ser 9.81 (H) 0.44 - 1.00 mg/dL   Calcium 8.8 (L) 8.9 - 10.3 mg/dL   Total Protein 7.4 6.5 - 8.1 g/dL   Albumin 3.5 3.5 - 5.0 g/dL   AST 60 (H) 15 - 41 U/L   ALT 8 (L) 14 - 54 U/L   Alkaline Phosphatase 56 38 - 126 U/L   Total Bilirubin 2.3 (H) 0.3 - 1.2 mg/dL   GFR calc non Af Amer 4 (L) >60 mL/min   GFR calc Af Amer 5 (L) >60 mL/min   Anion gap 17 (H) 5 - 15  Lipase, blood  Result Value Ref Range   Lipase 35 22 - 51 U/L  Urinalysis, Routine w reflex microscopic (not at [facility])  Result Value Ref Range   Color, Urine ORANGE (A) YELLOW   APPearance CLOUDY (A) CLEAR   Specific Gravity, Urine 1.014 1.005 - 1.030   pH 8.0 5.0 - 8.0   Glucose, UA 500 (A) NEGATIVE mg/dL   Hgb urine dipstick LARGE (A) NEGATIVE   Bilirubin Urine NEGATIVE NEGATIVE   Ketones, ur 15 (A) NEGATIVE mg/dL   Protein, ur 100 (A) NEGATIVE mg/dL   Urobilinogen, UA 0.2 0.0 - 1.0 mg/dL   Nitrite NEGATIVE NEGATIVE   Leukocytes, UA TRACE (A) NEGATIVE  Potassium  Result Value Ref Range   Potassium 6.4 (HH) 3.5 - 5.1 mmol/L  Urine microscopic-add on  Result Value Ref Range   Squamous Epithelial / LPF FEW (A) RARE   WBC, UA 0-2 <3 WBC/hpf   RBC / HPF TOO NUMEROUS TO COUNT <3  RBC/hpf   Bacteria, UA RARE RARE  POC urine preg, ED (not at [facility])  Result Value Ref Range   Preg Test, Ur NEGATIVE NEGATIVE   Dg Abd Acute W/chest  09/22/2014   CLINICAL DATA:  Central and right-sided abdominal pain since last night with radiation to back. Dialysis patient. History of hypertension and diabetes.  EXAM: DG ABDOMEN ACUTE W/ 1V CHEST  COMPARISON:  None.  FINDINGS: Single view of the chest: Cardiomediastinal silhouette is within normal limits in size and configuration. Lungs are clear. No evidence of pneumonia. No pleural effusion. No pneumothorax. Axillary/subclavian vessels stents noted on the left. No osseous abnormality.  Decubitus and supine views of the abdomen: Bowel gas pattern is nonobstructive. No evidence of free intraperitoneal air. No evidence of soft tissue mass or abnormal fluid collection. No pathologic -appearing calcifications. Intrauterine device noted within the central pelvis. No osseous abnormality.  IMPRESSION: No evidence of acute cardiopulmonary abnormality.  Lungs are clear.  Nonobstructive bowel gas pattern and no evidence of acute intra-abdominal abnormality.   Electronically Signed   By: Franki Cabot M.D.   On: 09/22/2014 14:32    MDM  I spoke with Dr. Justin Mend who will arrange for emergent hemodialysis for hyperkalemia. EKG shows no evidence of hyperkalemia . I spoke with Dr. Nehemiah Settle will arrange for CT scan and premedication as patient has had allergic reaction to contrast dye. Also suggested consultation with vascular surgeon as her dialysis graft is partially occluded and she was to have evaluation by vascular surgeon of her dialysis graft Final diagnoses:  None   plan 23 hour observation to telemetry Diagnosis #1 abdominal pain #2 hyperkalemia #3  hematuria    Orlie Dakin, MD 09/22/14 1640

## 2014-09-23 ENCOUNTER — Observation Stay (HOSPITAL_COMMUNITY): Payer: Medicare Other

## 2014-09-23 ENCOUNTER — Encounter (HOSPITAL_COMMUNITY): Payer: Self-pay | Admitting: Radiology

## 2014-09-23 DIAGNOSIS — I871 Compression of vein: Secondary | ICD-10-CM | POA: Diagnosis not present

## 2014-09-23 DIAGNOSIS — E1029 Type 1 diabetes mellitus with other diabetic kidney complication: Secondary | ICD-10-CM

## 2014-09-23 DIAGNOSIS — Z7982 Long term (current) use of aspirin: Secondary | ICD-10-CM | POA: Diagnosis not present

## 2014-09-23 DIAGNOSIS — Z833 Family history of diabetes mellitus: Secondary | ICD-10-CM | POA: Diagnosis not present

## 2014-09-23 DIAGNOSIS — N2889 Other specified disorders of kidney and ureter: Secondary | ICD-10-CM | POA: Diagnosis not present

## 2014-09-23 DIAGNOSIS — N949 Unspecified condition associated with female genital organs and menstrual cycle: Secondary | ICD-10-CM | POA: Diagnosis not present

## 2014-09-23 DIAGNOSIS — R310 Gross hematuria: Secondary | ICD-10-CM | POA: Diagnosis present

## 2014-09-23 DIAGNOSIS — Z992 Dependence on renal dialysis: Secondary | ICD-10-CM | POA: Diagnosis not present

## 2014-09-23 DIAGNOSIS — E10319 Type 1 diabetes mellitus with unspecified diabetic retinopathy without macular edema: Secondary | ICD-10-CM | POA: Diagnosis present

## 2014-09-23 DIAGNOSIS — T82898A Other specified complication of vascular prosthetic devices, implants and grafts, initial encounter: Secondary | ICD-10-CM | POA: Diagnosis not present

## 2014-09-23 DIAGNOSIS — Z79899 Other long term (current) drug therapy: Secondary | ICD-10-CM | POA: Diagnosis not present

## 2014-09-23 DIAGNOSIS — R1084 Generalized abdominal pain: Secondary | ICD-10-CM | POA: Diagnosis not present

## 2014-09-23 DIAGNOSIS — I724 Aneurysm of artery of lower extremity: Secondary | ICD-10-CM | POA: Diagnosis present

## 2014-09-23 DIAGNOSIS — F4310 Post-traumatic stress disorder, unspecified: Secondary | ICD-10-CM | POA: Diagnosis present

## 2014-09-23 DIAGNOSIS — R197 Diarrhea, unspecified: Secondary | ICD-10-CM | POA: Diagnosis present

## 2014-09-23 DIAGNOSIS — Z7902 Long term (current) use of antithrombotics/antiplatelets: Secondary | ICD-10-CM | POA: Diagnosis not present

## 2014-09-23 DIAGNOSIS — T82858A Stenosis of vascular prosthetic devices, implants and grafts, initial encounter: Secondary | ICD-10-CM | POA: Diagnosis present

## 2014-09-23 DIAGNOSIS — F329 Major depressive disorder, single episode, unspecified: Secondary | ICD-10-CM | POA: Diagnosis present

## 2014-09-23 DIAGNOSIS — Z975 Presence of (intrauterine) contraceptive device: Secondary | ICD-10-CM | POA: Diagnosis not present

## 2014-09-23 DIAGNOSIS — Z91040 Latex allergy status: Secondary | ICD-10-CM | POA: Diagnosis not present

## 2014-09-23 DIAGNOSIS — N1330 Unspecified hydronephrosis: Secondary | ICD-10-CM | POA: Diagnosis not present

## 2014-09-23 DIAGNOSIS — R109 Unspecified abdominal pain: Secondary | ICD-10-CM | POA: Diagnosis not present

## 2014-09-23 DIAGNOSIS — E875 Hyperkalemia: Secondary | ICD-10-CM | POA: Diagnosis not present

## 2014-09-23 DIAGNOSIS — N838 Other noninflammatory disorders of ovary, fallopian tube and broad ligament: Secondary | ICD-10-CM | POA: Diagnosis not present

## 2014-09-23 DIAGNOSIS — Y832 Surgical operation with anastomosis, bypass or graft as the cause of abnormal reaction of the patient, or of later complication, without mention of misadventure at the time of the procedure: Secondary | ICD-10-CM | POA: Diagnosis present

## 2014-09-23 DIAGNOSIS — T82858D Stenosis of vascular prosthetic devices, implants and grafts, subsequent encounter: Secondary | ICD-10-CM | POA: Diagnosis not present

## 2014-09-23 DIAGNOSIS — E1065 Type 1 diabetes mellitus with hyperglycemia: Secondary | ICD-10-CM | POA: Diagnosis present

## 2014-09-23 DIAGNOSIS — E1022 Type 1 diabetes mellitus with diabetic chronic kidney disease: Secondary | ICD-10-CM | POA: Diagnosis present

## 2014-09-23 DIAGNOSIS — Z91041 Radiographic dye allergy status: Secondary | ICD-10-CM | POA: Diagnosis not present

## 2014-09-23 DIAGNOSIS — I120 Hypertensive chronic kidney disease with stage 5 chronic kidney disease or end stage renal disease: Secondary | ICD-10-CM | POA: Diagnosis present

## 2014-09-23 DIAGNOSIS — E1129 Type 2 diabetes mellitus with other diabetic kidney complication: Secondary | ICD-10-CM | POA: Diagnosis not present

## 2014-09-23 DIAGNOSIS — N8320 Unspecified ovarian cysts: Secondary | ICD-10-CM | POA: Diagnosis present

## 2014-09-23 DIAGNOSIS — Z95820 Peripheral vascular angioplasty status with implants and grafts: Secondary | ICD-10-CM | POA: Diagnosis not present

## 2014-09-23 DIAGNOSIS — N186 End stage renal disease: Secondary | ICD-10-CM | POA: Diagnosis not present

## 2014-09-23 DIAGNOSIS — D72829 Elevated white blood cell count, unspecified: Secondary | ICD-10-CM | POA: Diagnosis not present

## 2014-09-23 DIAGNOSIS — D649 Anemia, unspecified: Secondary | ICD-10-CM | POA: Diagnosis present

## 2014-09-23 DIAGNOSIS — Z794 Long term (current) use of insulin: Secondary | ICD-10-CM | POA: Diagnosis not present

## 2014-09-23 LAB — BASIC METABOLIC PANEL
Anion gap: 10 (ref 5–15)
BUN: 24 mg/dL — ABNORMAL HIGH (ref 6–20)
CHLORIDE: 111 mmol/L (ref 101–111)
CO2: 21 mmol/L — ABNORMAL LOW (ref 22–32)
CREATININE: 5.35 mg/dL — AB (ref 0.44–1.00)
Calcium: 7 mg/dL — ABNORMAL LOW (ref 8.9–10.3)
GFR calc non Af Amer: 9 mL/min — ABNORMAL LOW (ref >60)
GFR, EST AFRICAN AMERICAN: 10 mL/min — AB (ref >60)
Glucose, Bld: 217 mg/dL — ABNORMAL HIGH (ref 65–99)
Potassium: 3.6 mmol/L (ref 3.5–5.1)
Sodium: 142 mmol/L (ref 135–145)

## 2014-09-23 LAB — GLUCOSE, CAPILLARY
GLUCOSE-CAPILLARY: 276 mg/dL — AB (ref 65–99)
GLUCOSE-CAPILLARY: 378 mg/dL — AB (ref 65–99)
Glucose-Capillary: 202 mg/dL — ABNORMAL HIGH (ref 65–99)
Glucose-Capillary: 250 mg/dL — ABNORMAL HIGH (ref 65–99)
Glucose-Capillary: 225 mg/dL — ABNORMAL HIGH (ref 65–99)

## 2014-09-23 LAB — CBC
HEMATOCRIT: 32.6 % — AB (ref 36.0–46.0)
HEMOGLOBIN: 10.6 g/dL — AB (ref 12.0–15.0)
MCH: 30.3 pg (ref 26.0–34.0)
MCHC: 32.5 g/dL (ref 30.0–36.0)
MCV: 93.1 fL (ref 78.0–100.0)
Platelets: 321 10*3/uL (ref 150–400)
RBC: 3.5 MIL/uL — AB (ref 3.87–5.11)
RDW: 13.9 % (ref 11.5–15.5)
WBC: 10.5 10*3/uL (ref 4.0–10.5)

## 2014-09-23 LAB — HEPATIC FUNCTION PANEL
ALT: 15 U/L (ref 14–54)
AST: 28 U/L (ref 15–41)
Albumin: 3.6 g/dL (ref 3.5–5.0)
Alkaline Phosphatase: 64 U/L (ref 38–126)
BILIRUBIN TOTAL: 0.6 mg/dL (ref 0.3–1.2)
Bilirubin, Direct: <0.1 mg/dL — ABNORMAL LOW (ref 0.1–0.5)
Total Protein: 7.6 g/dL (ref 6.5–8.1)

## 2014-09-23 MED ORDER — LIDOCAINE HCL (PF) 1 % IJ SOLN: 5.0000 mL | INTRAMUSCULAR | Status: DC | PRN

## 2014-09-23 MED ORDER — LIDOCAINE-PRILOCAINE 2.5-2.5 % EX CREA
1.0000 "application " | TOPICAL_CREAM | CUTANEOUS | Status: DC | PRN
  Filled 2014-09-23: qty 5

## 2014-09-23 MED ORDER — NEPRO/CARBSTEADY PO LIQD: 237.0000 mL | ORAL | Status: DC | PRN

## 2014-09-23 MED ORDER — SODIUM CHLORIDE 0.9 % IV SOLN: 100.0000 mL | INTRAVENOUS | Status: DC | PRN

## 2014-09-23 MED ORDER — HEPARIN SODIUM (PORCINE) 1000 UNIT/ML DIALYSIS: 1000.0000 [IU] | INTRAMUSCULAR | Status: DC | PRN

## 2014-09-23 MED ORDER — ALTEPLASE 2 MG IJ SOLR
2.0000 mg | Freq: Once | INTRAMUSCULAR | Status: DC | PRN
  Filled 2014-09-23: qty 2

## 2014-09-23 MED ORDER — PENTAFLUOROPROP-TETRAFLUOROETH EX AERO: 1.0000 "application " | INHALATION_SPRAY | CUTANEOUS | Status: DC | PRN

## 2014-09-23 MED ORDER — INSULIN ASPART 100 UNIT/ML ~~LOC~~ SOLN
3.0000 [IU] | Freq: Three times a day (TID) | SUBCUTANEOUS | Status: DC
  Administered 2014-09-24: 3 [IU] via SUBCUTANEOUS

## 2014-09-23 MED ORDER — HEPARIN SODIUM (PORCINE) 1000 UNIT/ML DIALYSIS: 20.0000 [IU]/kg | INTRAMUSCULAR | Status: DC | PRN

## 2014-09-23 MED ORDER — CEFTRIAXONE SODIUM IN DEXTROSE 20 MG/ML IV SOLN
1.0000 g | INTRAVENOUS | Status: DC
Start: 2014-09-23 — End: 2014-09-24
  Administered 2014-09-23: 1 g via INTRAVENOUS
  Filled 2014-09-23 (×2): qty 50

## 2014-09-23 MED ORDER — IOHEXOL 300 MG/ML  SOLN
100.0000 mL | Freq: Once | INTRAMUSCULAR | Status: AC | PRN
  Administered 2014-09-23: 100 mL via INTRAVENOUS

## 2014-09-23 NOTE — Progress Notes (Signed)
09/22/2014 9:05PM  Patient arrived on unit on bed with Dialysis RN. Patient alert and oriented x4. Patient oriented to room, staff and unit. Patient placed on telemetry, Bernie notified. Skin assessment completed with Rockie Neighbours RN. Bruising on lower extremities were noted. IV clean, dry and intact. Patient denies being in pain. Safety Fall Prevention Plan was given, discussed and signed by patient. Patient has daughter and husband at the bedside. Orders have been reviewed and implemented. Call light has been placed within reach. RN will continue to monitor the patient.   Nena Polio BSN, RN  Phone Number: [phone]

## 2014-09-23 NOTE — Consult Note (Signed)
Hospital Consult    Reason for Consult:  "partially clogged graft" Referring Physician:  Nephrology MRN #:  [id number]  History of Present Illness: This is a 44 y.o. female with ESRD who states she has been having decreased flow rates on dialysis.  She is not having trouble dialyzing via her graft otherwise.   A couple of days ago, she had undergone premedication for a procedure by Dr. Augustin Coupe to "unclog" her graft.  She states that about an 1.5 hrs after taking her medication, she developed abdominal pain.  She states that it was going to be about an hour before Dr. Augustin Coupe could do her procedure and she states she couldn't wait and came to the ED.  At that time, she was found to have hyperkalemia and was admitted to the hospital and underwent HD for 3 hours.  K+ this am is 3.6.    She states that her abdominal pain has improved with Morphine.  She is undergoing premedication at this time for CT scan today.  She states that it was all of a sudden and not associated with eating.  She did have some non bloody diarrhea that lasted ~ 4 hours.  After that, she had the sensation to use the bathroom, but no further diarrhea.  She states she did have some blood in her urine this morning.  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 Scot Dock.  She states that she is very protective of her graft and does not let anyone stick the same place twice.    In April 2016, she was seen by Dr. Oneida Alar after having a right groin pseudoaneurysm after she was sent to [facility] by Dr. Augustin Coupe for a "specialized procedure".  After review of her records, she underwent a stent placement of the left axillary artery with a 6x120 Supera SES.  She is on Plavix for this.  The 80% stenosis was decreased to a 20% stenosis.  She was having pain in her right groin ~ 1 week later and she was found to have a  pseudoaneurysm, which was injected by Dr. Oneida Alar.  She has not had any further issues with this.    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  She is on insulin/Actos for her DM.   She states that she is not on a transplant list at this time, but does have plans to restart the process in September.   She dialyzes T/T/S at [facility] center.  She is left handed, but states she was not a candidate for right arm access.  Past Medical History  Diagnosis Date  . Diabetes mellitus without complication   . Renal disorder   . Hypertension   . Pneumonia     Past Surgical History  Procedure Laterality Date  . Cesarean section    . Eye surgery      retinal detachment    Allergies  Allergen Reactions  . Iohexol      Code: HIVES, Desc: ITCHING, HIVES, SWELLING- 13 HR PRE-MEDS REQUIRED- ASM, Onset Date: [id number]   . Latex     unknown    Prior to Admission medications   Medication Sig Start Date End Date Taking? Authorizing Provider  albuterol (PROVENTIL HFA;VENTOLIN HFA) 108 (90 BASE) MCG/ACT inhaler Inhale 1-2 puffs into the lungs every 6 (six) hours as needed  for wheezing or shortness of breath.   Yes Historical Provider, MD  aspirin 81 MG tablet Take 81 mg by mouth daily.   Yes Historical Provider, MD  B Complex-C-Folic Acid (DIALYVITE TABLET) TABS Take 1 tablet by mouth daily at 12 noon.   Yes Historical Provider, MD  calcium acetate (PHOSLO) 667 MG capsule Take 667 mg by mouth 3 (three) times daily with meals.    Yes Historical Provider, MD  cinacalcet (SENSIPAR) 60 MG tablet Take 60 mg by mouth daily.   Yes Historical Provider, MD  clopidogrel (PLAVIX) 75 MG tablet Take 75 mg by mouth daily.   Yes Historical Provider, MD  glucose blood (ONE TOUCH ULTRA TEST) test strip 1 each by Other route 2 (two) times daily. And lancets 2/day 04/05/14  Yes Renato Shin, MD  insulin lispro (HUMALOG KWIKPEN) 100 UNIT/ML KiwkPen Inject 0.1 mLs (10 Units total) into the skin 3  (three) times daily with meals. And pen needles 3/day 04/05/14  Yes Renato Shin, MD  Insulin Pen Needle (DROPLET PEN NEEDLES) 31G X 5 MM MISC Use to inject insulin 3 times per day 09/10/14  Yes Renato Shin, MD  levonorgestrel (MIRENA) 20 MCG/24HR IUD 1 each by Intrauterine route once.   Yes Historical Provider, MD  lidocaine-prilocaine (EMLA) cream Apply 1 application topically daily as needed (dialysis).    Yes Historical Provider, MD  pioglitazone (ACTOS) 15 MG tablet Take 15 mg by mouth daily.   Yes Historical Provider, MD  sevelamer carbonate (RENVELA) 800 MG tablet Take 4,000-4,800 mg by mouth 3 (three) times daily with meals.    Yes Historical Provider, MD    History   Social History  . Marital Status: Married    Spouse Name: N/A  . Number of Children: N/A  . Years of Education: N/A   Occupational History  . Not on file.   Social History Main Topics  . Smoking status: Never Smoker   . Smokeless tobacco: Never Used  . Alcohol Use: No  . Drug Use: No  . Sexual Activity: Not on file   Other Topics Concern  . Not on file   Social History Narrative     Family History  Problem Relation Age of Onset  . Diabetes Maternal Grandmother     ROS: '[x]'  Positive   '[ ]'  Negative   '[ ]'  All sytems reviewed and are negative  Cardiovascular: '[]'  chest pain/pressure '[]'  palpitations '[]'  SOB lying flat '[]'  DOE '[]'  pain in legs while walking '[]'  pain in legs at rest '[]'  pain in legs at night '[]'  non-healing ulcers '[]'  hx of DVT '[]'  swelling in legs  Pulmonary: '[]'  productive cough '[]'  asthma/wheezing '[]'  home O2  Neurologic: '[]'  weakness in '[]'  arms '[]'  legs '[]'  numbness in '[]'  arms '[]'  legs '[]'  hx of CVA '[]'  mini stroke '[]' difficulty speaking or slurred speech '[]'  temporary loss of vision in one eye '[]'  dizziness  Hematologic: '[]'  hx of cancer '[]'  bleeding problems '[]'  problems with blood clotting easily  Endocrine:   '[x]'  diabetes '[]'  thyroid disease  GI '[]'  vomiting blood '[]'  blood in  stool '[x]'  diarrhea-resolved '[x]'  abdominal pain-improved with morphine  GU: '[x]'  CKD/renal failure '[x]'  HD--'[]'  M/W/F or '[x]'  T/T/S-[place] '[]'  burning with urination '[x]'  blood in urine  Psychiatric: '[]'  anxiety '[]'  depression  Musculoskeletal: '[]'  arthritis '[]'  joint pain  Integumentary: '[]'  rashes '[]'  ulcers  Constitutional: '[]'  fever '[]'  chills   Physical Examination  Filed Vitals:   09/23/14 0952  BP: 146/64  Pulse: 89  Temp: 98.3 F (36.8 C)  Resp: 18   Body mass index is 30.72 kg/(m^2).  General:  WDWN in NAD Gait: Not observed HENT: WNL, normocephalic Pulmonary: normal non-labored breathing, without Rales, rhonchi,  wheezing Cardiac: regular, without  Murmurs, rubs or gallops; without carotid bruits Abdomen: soft, NT/ND, no masses Skin: without rashes, without ulcers  Vascular Exam/Pulses:  Right Left  Radial 1+ (weak) 1+ (weak)  Ulnar Unable to palpate  Unable to palpate   Femoral Unable to palpate  2+ (normal)  DP 2+ (normal) 2+ (normal)  PT Unable to palpate  Unable to palpate    Extremities: without ischemic changes, without Gangrene , without cellulitis; without open wounds;  +thrill/bruit within graft.  Well healed incisions. Musculoskeletal: no muscle wasting or atrophy  Neurologic: A&O X 3; Appropriate Affect ; SENSATION: normal; MOTOR FUNCTION:  moving all extremities equally. Speech is fluent/normal Psychiatric:  Normal affect appearing capable of medical decision making Lymph:  No inguinal lymphadenopathy   CBC    Component Value Date/Time   WBC 10.5 09/23/2014 0446   WBC 12.1* 08/11/2011 1330   RBC 3.50* 09/23/2014 0446   RBC 4.01* 08/11/2011 1330   HGB 10.6* 09/23/2014 0446   HGB 11.2* 08/11/2011 1330   HCT 32.6* 09/23/2014 0446   HCT 35.0* 08/11/2011 1330   PLT 321 09/23/2014 0446   MCV 93.1 09/23/2014 0446   MCV 87.2 08/11/2011 1330   MCH 30.3 09/23/2014 0446   MCH 27.9 08/11/2011 1330   MCHC 32.5 09/23/2014 0446   MCHC  32.0 08/11/2011 1330   RDW 13.9 09/23/2014 0446   LYMPHSABS 1.7 09/22/2014 1350   MONOABS 0.0* 09/22/2014 1350   EOSABS 0.0 09/22/2014 1350   BASOSABS 0.0 09/22/2014 1350    BMET    Component Value Date/Time   NA 142 09/23/2014 0446   K 3.6 09/23/2014 0446   CL 111 09/23/2014 0446   CO2 21* 09/23/2014 0446   GLUCOSE 217* 09/23/2014 0446   BUN 24* 09/23/2014 0446   CREATININE 5.35* 09/23/2014 0446   CALCIUM 7.0* 09/23/2014 0446   GFRNONAA 9* 09/23/2014 0446   GFRAA 10* 09/23/2014 0446    COAGS: No results found for: INR, PROTIME   Non-Invasive Vascular Imaging:   none  Statin:  No. Beta Blocker:  No. Aspirin:  Yes.   ACEI:  No. ARB:  No. Other antiplatelets/anticoagulants:  Yes.   Heparin SQ   ASSESSMENT/PLAN: This is a 44 y.o. female with ESRD with LUA AVG with low flow rates on HD.   -pt was scheduled to undergo procedure by Dr. Augustin Coupe a couple of days ago, but this did not happen secondary to acute abdominal pain and she was admitted to the hospital. -she is not having any trouble dialyzing at this time, however, she is having low return rates per pt.  After she has her CT scans and her abdominal pain issues have resolved, she may need fistulogram to evaluate graft. -she has had stenting of her left axillary artery earlier this year at [facility] hospital with subsequent right femoral pseudoaneurysm treated with thrombin injection by Dr. Oneida Alar in April -will follow   Leontine Locket, PA-C [facility] [phone]  Agree with above.  Patient is currently on HD and the graft is working well. If it becomes a problem workup could include duplex to evaluate AVG and to evaluate the left axillary stent (although may be difficult to visualize) +/- fistulogram.  Will follow.  Deitra Mayo, MD, FACS  Beeper [phone]

## 2014-09-23 NOTE — Consult Note (Signed)
Urology Consult  CC: Referring physician:  Dr. Cruzita Lederer Reason for referral: Right hydronephrosis  History of Present Illness: The patient is a 44 year old female with end-stage renal disease secondary to hypertension and diabetes.  She was admitted to the hospital abdominal pain that she described as severe and primarily located in the anterior abdomen beneath the ribs and radiated in the area of the spine on the right-hand side. She said she noted that the pain was worsened when she laid on her right side.  A CT scan was obtained which revealed right hydronephrosis with bilateral renal atrophy and no evidence of renal calculi or renal mass.  There was no evidence of stone along the course of the ureter.  The bladder was decompressed and she had reported experiencing gross hematuria earlier today. She reports that she has never had a kidney stone. She has had rare UTIs. She has not had any fever but said she had some chills before she came into the hospital. She urinates about 2-3 times per day. She has not expressed any gross hematuria prior to her hospitalization however she was catheterized for a specimen and reports that when she urinated after having been catheterized she noted grossly bloody urine. Her abdominal and flank pain has now resolved. She denies any suprapubic discomfort.  Past Medical History  Diagnosis Date  . Diabetes mellitus without complication   . Renal disorder   . Hypertension   . Pneumonia    Past Surgical History  Procedure Laterality Date  . Cesarean section    . Eye surgery      retinal detachment    Medications:  Prior to Admission:  Prescriptions prior to admission  Medication Sig Dispense Refill Last Dose  . albuterol (PROVENTIL HFA;VENTOLIN HFA) 108 (90 BASE) MCG/ACT inhaler Inhale 1-2 puffs into the lungs every 6 (six) hours as needed for wheezing or shortness of breath.   rescue at rescue  . aspirin 81 MG tablet Take 81 mg by mouth daily.   09/21/2014 at  Unknown time  . B Complex-C-Folic Acid (DIALYVITE TABLET) TABS Take 1 tablet by mouth daily at 12 noon.   09/21/2014 at Unknown time  . calcium acetate (PHOSLO) 667 MG capsule Take 667 mg by mouth 3 (three) times daily with meals.    09/21/2014 at Unknown time  . cinacalcet (SENSIPAR) 60 MG tablet Take 60 mg by mouth daily.   09/21/2014 at Unknown time  . clopidogrel (PLAVIX) 75 MG tablet Take 75 mg by mouth daily.   09/21/2014 at Unknown time  . glucose blood (ONE TOUCH ULTRA TEST) test strip 1 each by Other route 2 (two) times daily. And lancets 2/day 100 each 12 09/21/2014 at Unknown time  . insulin lispro (HUMALOG KWIKPEN) 100 UNIT/ML KiwkPen Inject 0.1 mLs (10 Units total) into the skin 3 (three) times daily with meals. And pen needles 3/day 15 mL 11 09/21/2014 at Unknown time  . Insulin Pen Needle (DROPLET PEN NEEDLES) 31G X 5 MM MISC Use to inject insulin 3 times per day 300 each 2 09/21/2014 at Unknown time  . levonorgestrel (MIRENA) 20 MCG/24HR IUD 1 each by Intrauterine route once.   unk at unk  . lidocaine-prilocaine (EMLA) cream Apply 1 application topically daily as needed (dialysis).    Past Week at Unknown time  . pioglitazone (ACTOS) 15 MG tablet Take 15 mg by mouth daily.   09/21/2014 at Unknown time  . sevelamer carbonate (RENVELA) 800 MG tablet Take 4,000-4,800 mg by mouth 3 (three)  times daily with meals.    09/21/2014 at Unknown time   Scheduled: . antiseptic oral rinse  7 mL Mouth Rinse q12n4p  . aspirin  81 mg Oral Daily  . calcium acetate  667 mg Oral TID WC  . cefTRIAXone (ROCEPHIN)  IV  1 g Intravenous Q24H  . chlorhexidine  15 mL Mouth Rinse BID  . cinacalcet  60 mg Oral Q breakfast  . clopidogrel  75 mg Oral Daily  . heparin  5,000 Units Subcutaneous 3 times per day  . insulin aspart  0-20 Units Subcutaneous TID WC  . insulin aspart  3 Units Subcutaneous TID WC  . pioglitazone  15 mg Oral Daily  . sevelamer carbonate  4,000 mg Oral TID WC  . sodium chloride  3 mL  Intravenous Q12H    Allergies:  Allergies  Allergen Reactions  . Iohexol      Code: HIVES, Desc: ITCHING, HIVES, SWELLING- 13 HR PRE-MEDS REQUIRED- ASM, Onset Date: [id number]   . Latex     unknown    Family History  Problem Relation Age of Onset  . Diabetes Maternal Grandmother     Social History:  reports that she has never smoked. She has never used smokeless tobacco. She reports that she does not drink alcohol or use illicit drugs.  Review of Systems: Pertinent items are noted in HPI. A comprehensive review of systems was negative except as noted above.  Physical Exam:  Vital signs in last 24 hours: Temp:  [97.7 F (36.5 C)-98.3 F (36.8 C)] 98.3 F (36.8 C) (07/28 0952) Pulse Rate:  [77-98] 83 (07/28 1500) Resp:  [12-21] 17 (07/28 1500) BP: (99-181)/(51-102) 152/76 mmHg (07/28 1500) SpO2:  [94 %-100 %] 98 % (07/28 0952) Weight:  [76.204 kg (168 lb)-79.5 kg (175 lb 4.3 oz)] 76.204 kg (168 lb) (07/27 2111) General appearance: alert and appears stated age Head: Normocephalic, without obvious abnormality, atraumatic Eyes: conjunctivae/corneas clear. EOM's intact.  Oropharynx: moist mucous membranes Neck: supple, symmetrical, trachea midline Resp: normal respiratory effort Cardio: regular rate and rhythm Back: symmetric, no curvature. ROM normal. Mild right CVAT. GI: soft, non-tender; bowel sounds normal; no masses,  no organomegaly Extremities: extremities normal, atraumatic, no cyanosis or edema Skin: Skin color normal. No visible rashes or lesions Neurologic: Grossly normal  Laboratory Data:   Recent Labs  09/22/14 1350 09/23/14 0446  WBC 13.9* 10.5  HGB 12.8 10.6*  HCT 37.6 32.6*   BMET  Recent Labs  09/22/14 2221 09/23/14 0446  NA 138 142  K 4.3 3.6  CL 96* 111  CO2 25 21*  GLUCOSE 228* 217*  BUN 24* 24*  CREATININE 5.50* 5.35*  CALCIUM 9.5 7.0*   No results for input(s): LABPT, INR in the last 72 hours. No results for input(s): LABURIN in  the last 72 hours. Results for orders placed or performed during the hospital encounter of 05/08/12  MRSA PCR Screening     Status: None   Collection Time: 05/08/12  9:36 PM  Result Value Ref Range Status   MRSA by PCR NEGATIVE NEGATIVE Final    Comment:        The GeneXpert MRSA Assay (FDA approved for NASAL specimens only), is one component of a comprehensive MRSA colonization surveillance program. It is not intended to diagnose MRSA infection nor to guide or monitor treatment for MRSA infections.   Creatinine:  Recent Labs  09/22/14 1350 09/22/14 2221 09/23/14 0446  CREATININE 9.81* 5.50* 5.35*    Imaging: Ct Abdomen  Pelvis W Contrast  09/23/2014   CLINICAL DATA:  Right upper quadrant and right flank pain and tenderness.  EXAM: CT ABDOMEN AND PELVIS WITH CONTRAST  TECHNIQUE: Multidetector CT imaging of the abdomen and pelvis was performed using the standard protocol following bolus administration of intravenous contrast.  CONTRAST:  80 mL Omnipaque 300  COMPARISON:  Noncontrast CT on 05/19/2006  FINDINGS: Lower Chest: No acute findings.  Hepatobiliary: No masses or other significant abnormality identified. Gallbladder is unremarkable.  Pancreas: No mass, inflammatory changes, or other significant abnormality identified.  Spleen:  Within normal limits in size and appearance.  Adrenals:  No masses identified.  Kidneys/Urinary Tract: Progressive diffuse bilateral renal parenchymal atrophy is seen compared to previous study. Renal vascular calcification noted. No definite renal calculi identified.  Moderate right hydronephrosis and ureterectasis is seen which is new since previous study, however no obstructing calculus visualized. The urinary bladder is nearly completely empty and not well visualized.  Stomach/Bowel/Peritoneum: No evidence of wall thickening, mass, or obstruction. Normal appendix visualized.  Vascular/Lymphatic: No pathologically enlarged lymph nodes identified. No  abdominal aortic aneurysm or other significant retroperitoneal abnormality demonstrated.  Reproductive: IUD seen in appropriate position. A complex cystic lesion is seen in the left adnexa which measures 2.8 x 4.6 cm and has a mural nodular density measuring 2.4 cm. This is new since previous study and suspicious for cystic ovarian neoplasm. No evidence of free fluid.  Other:  None.  Musculoskeletal:  No suspicious bone lesions identified.  IMPRESSION: Moderate right hydronephrosis and ureterectasis. No ureteral calculus or other obstructing etiology visualized radiographically. Consider urology referral and retrograde ureteroscopy for further evaluation.  Diffuse bilateral renal parenchymal atrophy which shows significant progression since 2008 exam.  4.6 cm cystic and solid appearing left adnexal mass, suspicious for cystic ovarian neoplasm. Recommend pelvic ultrasound for further evaluation.  IUD in appropriate position.   Electronically Signed   By: Earle Gell M.D.   On: 09/23/2014 14:09   Dg Abd Acute W/chest  09/22/2014   CLINICAL DATA:  Central and right-sided abdominal pain since last night with radiation to back. Dialysis patient. History of hypertension and diabetes.  EXAM: DG ABDOMEN ACUTE W/ 1V CHEST  COMPARISON:  None.  FINDINGS: Single view of the chest: Cardiomediastinal silhouette is within normal limits in size and configuration. Lungs are clear. No evidence of pneumonia. No pleural effusion. No pneumothorax. Axillary/subclavian vessels stents noted on the left. No osseous abnormality.  Decubitus and supine views of the abdomen: Bowel gas pattern is nonobstructive. No evidence of free intraperitoneal air. No evidence of soft tissue mass or abnormal fluid collection. No pathologic -appearing calcifications. Intrauterine device noted within the central pelvis. No osseous abnormality.  IMPRESSION: No evidence of acute cardiopulmonary abnormality.  Lungs are clear.  Nonobstructive bowel gas pattern  and no evidence of acute intra-abdominal abnormality.   Electronically Signed   By: Franki Cabot M.D.   On: 09/22/2014 14:32    Impression/Assessment:  She has unexplained right hydronephrosis.  Acute obstruction of the kidney due to a stone and cause flank pain but a slow, progressive obstruction of the kidney as seen in neoplastic causes typically causes no discomfort whatsoever.  Her white blood cell count was slightly elevated at 13.9 yesterday and today is normal at 10.5 and she is on antibiotics but her urine did not appear to be infected in any way with only 0-2 white blood cells and nitrite negativity.  A urine culture is currently pending.  She has not  had any fever.  While the passage of a stone could have occurred which would result in acute flank pain and hematuria with no stone being seen on the CT scan, no prior history of stones and a history of having been catheterized and then experiencing gross hematuria the next time she urinated, it is my opinion that this still needs further evaluation to rule out a neoplastic causes. What will be necessary is cystoscopy with retrograde pyelogram and probable ureteroscopy.  This will be undertaken as an outpatient.  I do not feel she needs an acute stent at this time.   I went over the planned procedure with her today in detail and have discussed the potential risks and complications, alternatives, probability of success, outpatient nature of the procedure as well as the anticipated postoperative course. She understands the fact that she very well may need to have a stent remain indwelling after the procedure. We also discussed the reason why this cannot be performed here at [facility] during this admission.    Plan:  1. Await urine culture results. 2. My office will schedule her for cystoscopy, retrograde pyelography, ureteroscopy and stent.   Juliah Scadden C 09/23/2014, 3:15 PM

## 2014-09-23 NOTE — Progress Notes (Signed)
Results for TAMALYN, MELKA (MRN [id number]) as of 09/23/2014 14:30  Ref. Range 09/22/2014 21:07 09/22/2014 23:50 09/23/2014 04:32 09/23/2014 08:08 09/23/2014 12:54  Glucose-Capillary Latest Ref Range: 65-99 mg/dL 183 (H) 214 (H) 250 (H) 276 (H) 225 (H)  Noted that blood sugars are greater than 180 mg/dl.  Recommend adding Novolog 4-5 units TID as meal coverage if postprandial blood sugars continue to be greater than 180 mg/dl.  Patient takes Novolog 10 units TID at home. Harvel Ricks RN BSN CDE

## 2014-09-23 NOTE — Progress Notes (Signed)
[facility] Progress Note  Assessment/Plan: 1. Hyperkalemia:Had HD last night for three hours,  1 hour on 1 K bath.  09/23/14 K+ 3.6 Resolved.  2. Decreased AF in AVG: AF 1702 on 08/28/14 Repeat AF 470 (09/18/14) and 452 (09/21/14). Had apt with Dr. Augustin Coupe today for graftogram. Called [facility] to see patient 3. Abdominal Pain: Per primary-was scheduled for CT of abdomen after HD last night. Has not been done-remains NPO. States abdominal pain improved.  4. ESRD - T,T,S at [facility]. Heparin 3500 units at center. No heparin tonight. 5. Hypertension/volume - Net UF 2000 Post Wt 76.2. Will had short HD today to get back on schedule.  6. Anemia - HGB 12.8 No ESAs. Follow CBCs.  7. Metabolic bone disease - Ca 8.8 C Ca 9.2 Continue out patient binders.  8. Nutrition - remains NPO for CT scan of abdomin.  9. DM/Hyperglycemia: SS per primary.  Rita H. Brown NP-C 09/23/2014, 8:49 AM  [facility] [phone]  Pt seen, examined and agree w A/P as above. SP pain/ flank pain/ epigastric pain with urinary urgency and abnormal UA. Also some RUQ tenderness. CT pending r/o cholecystitis, other likely cause is UTI +/- pyelo. Agree w empiric abx. Will follow. HD today again to get back on schedule. Have asked [facility] to see for poor access flows, she is on a prednisone prep for a dye allergy already.  Kelly Splinter MD pager [phone]    cell [phone] 09/23/2014, 11:26 AM    Subjective:  "I'm feeling a little better-I still haven't had my scans". Sitting in bed talking on phone. States abdominal pain improved but still lingering in back. No temps during night, no further NVD.   Objective Filed Vitals:   09/22/14 2030 09/22/14 2034 09/22/14 2111 09/23/14 0443  BP: 106/84 122/65 99/51 134/56  Pulse: 94 92 91 85  Temp:  98 F (36.7 C) 97.7 F (36.5 C) 98 F (36.7 C)  TempSrc:  Oral    Resp: 16 17 21 18   Height:      Weight:   76.204 kg (168 lb)   SpO2:  100% 100% 99%    Physical Exam General: Well nourished, alert, NAD Heart: S1,S2, RRR  Lungs: Bilateral breath sounds CTA Abdomen: soft still tender upper quadrants no distention. Extremities: No edema.  Dialysis Access: LUA AVG + Thrill + Bruit. Decreased in R. Inner aspect of graft.   Dialysis Orders: Center: [facility] on TTS . EDW 75.5 KG HD Bath 2.0 K 2.0 K 1 mg Time 4 hours Heparin 3500 Units. Access LUA AVG BFR 400 DFR Autoflow 1.5  Hectoral 4 mcg IV T,T,S (last dose 0726/16)  Additional Objective Labs: Basic Metabolic Panel:  Recent Labs Lab 09/22/14 1350  09/22/14 1840 09/22/14 2221 09/23/14 0446  NA 131*  --   --  138 142  K 7.4*  < > 3.7 4.3 3.6  CL 97*  --   --  96* 111  CO2 17*  --   --  25 21*  GLUCOSE 308*  --   --  228* 217*  BUN 66*  --   --  24* 24*  CREATININE 9.81*  --   --  5.50* 5.35*  CALCIUM 8.8*  --   --  9.5 7.0*  PHOS  --   --   --  5.7*  --   < > = values in this interval not displayed. Liver Function Tests:  Recent Labs Lab 09/22/14 1350 09/22/14 2221  AST 60*  --  ALT 8*  --   ALKPHOS 56  --   BILITOT 2.3*  --   PROT 7.4  --   ALBUMIN 3.5 3.8    Recent Labs Lab 09/22/14 1350  LIPASE 35   CBC:  Recent Labs Lab 09/22/14 1350 09/23/14 0446  WBC 13.9* 10.5  NEUTROABS 12.2*  --   HGB 12.8 10.6*  HCT 37.6 32.6*  MCV 90.8 93.1  PLT 405* 321   Blood Culture No results found for: SDES, SPECREQUEST, CULT, REPTSTATUS  Cardiac Enzymes: No results for input(s): CKTOTAL, CKMB, CKMBINDEX, TROPONINI in the last 168 hours. CBG:  Recent Labs Lab 09/22/14 2107 09/22/14 2350 09/23/14 0432 09/23/14 0808  GLUCAP 183* 214* 250* 276*   Iron Studies: No results for input(s): IRON, TIBC, TRANSFERRIN, FERRITIN in the last 72 hours. @lablastinr3 @ Studies/Results: Dg Abd Acute W/chest  09/22/2014   CLINICAL DATA:  Central and right-sided abdominal pain since last night with radiation to back. Dialysis patient. History of hypertension and diabetes.   EXAM: DG ABDOMEN ACUTE W/ 1V CHEST  COMPARISON:  None.  FINDINGS: Single view of the chest: Cardiomediastinal silhouette is within normal limits in size and configuration. Lungs are clear. No evidence of pneumonia. No pleural effusion. No pneumothorax. Axillary/subclavian vessels stents noted on the left. No osseous abnormality.  Decubitus and supine views of the abdomen: Bowel gas pattern is nonobstructive. No evidence of free intraperitoneal air. No evidence of soft tissue mass or abnormal fluid collection. No pathologic -appearing calcifications. Intrauterine device noted within the central pelvis. No osseous abnormality.  IMPRESSION: No evidence of acute cardiopulmonary abnormality.  Lungs are clear.  Nonobstructive bowel gas pattern and no evidence of acute intra-abdominal abnormality.   Electronically Signed   By: Franki Cabot M.D.   On: 09/22/2014 14:32   Medications:   . antiseptic oral rinse  7 mL Mouth Rinse q12n4p  . aspirin  81 mg Oral Daily  . calcium acetate  667 mg Oral TID WC  . chlorhexidine  15 mL Mouth Rinse BID  . cinacalcet  60 mg Oral Q breakfast  . clopidogrel  75 mg Oral Daily  . diphenhydrAMINE  50 mg Oral Once  . heparin  5,000 Units Subcutaneous 3 times per day  . insulin aspart  0-20 Units Subcutaneous TID WC  . pioglitazone  15 mg Oral Daily  . predniSONE  50 mg Oral Q6H  . sevelamer carbonate  4,000 mg Oral TID WC  . sodium chloride  3 mL Intravenous Q12H

## 2014-09-23 NOTE — Progress Notes (Addendum)
PROGRESS NOTE  ESSICA JAISWAL [id number] DOB: 1970/04/22 DOA: 09/22/2014 PCP: [name], NP   HPI: Morgan [name] is a 44 y.o. female end-stage renal disease on dialysis, diabetes mellitus, and hypertension presenting with central abdominal pain. Patient has a partial occlusion of her graft and was supposed to undergo a procedure by interventional radiology today to correct this. Because of her allergy to IV contrast, the patient was undergoing premedication with prednisone yesterday. The patient started having severe constant central abdominal pain yesterday with multiple episodes of nonbloody, watery diarrhea. The patient presented to the emergency department yesterday morning for evaluation. Her pain radiates into her lower abdomen. She denies palliating or provoking factors.  In the Emergency Department, she was noted to have hyperkalemia, although she currently has no EKG changes. She is on dialysis and has her dialysis Tuesday, Thursday, and Saturdays. She did have dialysis yesterday, however only spent 3 hours at dialysis.  Subjective / 24 H Interval events - Persistent pain in central abdomen, RUQ, and right flank. Blood in urine seen this am.  - Denies nausea, vomiting, or chest pain. No fever.  Assessment/Plan: Active Problems:   DM (diabetes mellitus) type I controlled with renal manifestation   Leukocytosis   Hyperkalemia   Abdominal pain   Diarrhea  Right upper quadrant/Right flank pain with right hydronephrosis - Right CVA tenderness - CT scan shows moderate right hydronephrosis and ureterectasis - no ureteral calculus or other obstructing cause visualized. Will consult urology.   Left Adnexal mass - CT also shows a 4.6 cm cystic and solid appearing left adnexal mass, suspicious for cystic ovarian neoplasm. Pelvic US ordered. Will contact her gynecologist Dr. Aloha Gell at [facility].  Hyperkalemia - Potassium is 3.6. - monitor  metabolic panel in a.m.  Leukocytosis - WBC is 10.5. - follow CBC in a.m.  Diarrhea - no complaints today - maalox prn  Diabetes Mellitus - continue Actos - CBG every 4 hours - SSI Novolog + scheduled  End-stage renal disease, on dialysis - dialyzed today - nephrology following  Graft partial occlusion - Vascular surgery consulted.   Diet: Diet renal with fluid restriction Fluid restriction:: 1200 mL Fluid; Room service appropriate?: Yes; Fluid consistency:: Thin Fluids: NS DVT Prophylaxis: Heparin  Code Status: Full Code Family Communication: no family at bedside Disposition Plan: Pending  Consultants:  Nephrology  Urology  Procedures:  Dialysis   Antibiotics  Anti-infectives    Start     Dose/Rate Route Frequency Ordered Stop   09/23/14 1100  cefTRIAXone (ROCEPHIN) 1 g in dextrose 5 % 50 mL IVPB - Premix     1 g 100 mL/hr over 30 Minutes Intravenous Every 24 hours 09/23/14 1048        Studies  Ct Abdomen Pelvis W Contrast  09/23/2014   CLINICAL DATA:  Right upper quadrant and right flank pain and tenderness.  EXAM: CT ABDOMEN AND PELVIS WITH CONTRAST  TECHNIQUE: Multidetector CT imaging of the abdomen and pelvis was performed using the standard protocol following bolus administration of intravenous contrast.  CONTRAST:  80 mL Omnipaque 300  COMPARISON:  Noncontrast CT on 05/19/2006  FINDINGS: Lower Chest: No acute findings.  Hepatobiliary: No masses or other significant abnormality identified. Gallbladder is unremarkable.  Pancreas: No mass, inflammatory changes, or other significant abnormality identified.  Spleen:  Within normal limits in size and appearance.  Adrenals:  No masses identified.  Kidneys/Urinary Tract: Progressive diffuse bilateral renal parenchymal atrophy is seen compared to previous study. Renal vascular  calcification noted. No definite renal calculi identified.  Moderate right hydronephrosis and ureterectasis is seen which is new since  previous study, however no obstructing calculus visualized. The urinary bladder is nearly completely empty and not well visualized.  Stomach/Bowel/Peritoneum: No evidence of wall thickening, mass, or obstruction. Normal appendix visualized.  Vascular/Lymphatic: No pathologically enlarged lymph nodes identified. No abdominal aortic aneurysm or other significant retroperitoneal abnormality demonstrated.  Reproductive: IUD seen in appropriate position. A complex cystic lesion is seen in the left adnexa which measures 2.8 x 4.6 cm and has a mural nodular density measuring 2.4 cm. This is new since previous study and suspicious for cystic ovarian neoplasm. No evidence of free fluid.  Other:  None.  Musculoskeletal:  No suspicious bone lesions identified.  IMPRESSION: Moderate right hydronephrosis and ureterectasis. No ureteral calculus or other obstructing etiology visualized radiographically. Consider urology referral and retrograde ureteroscopy for further evaluation.  Diffuse bilateral renal parenchymal atrophy which shows significant progression since 2008 exam.  4.6 cm cystic and solid appearing left adnexal mass, suspicious for cystic ovarian neoplasm. Recommend pelvic ultrasound for further evaluation.  IUD in appropriate position.   Electronically Signed   By: Earle Gell M.D.   On: 09/23/2014 14:09   Dg Abd Acute W/chest  09/22/2014   CLINICAL DATA:  Central and right-sided abdominal pain since last night with radiation to back. Dialysis patient. History of hypertension and diabetes.  EXAM: DG ABDOMEN ACUTE W/ 1V CHEST  COMPARISON:  None.  FINDINGS: Single view of the chest: Cardiomediastinal silhouette is within normal limits in size and configuration. Lungs are clear. No evidence of pneumonia. No pleural effusion. No pneumothorax. Axillary/subclavian vessels stents noted on the left. No osseous abnormality.  Decubitus and supine views of the abdomen: Bowel gas pattern is nonobstructive. No evidence of free  intraperitoneal air. No evidence of soft tissue mass or abnormal fluid collection. No pathologic -appearing calcifications. Intrauterine device noted within the central pelvis. No osseous abnormality.  IMPRESSION: No evidence of acute cardiopulmonary abnormality.  Lungs are clear.  Nonobstructive bowel gas pattern and no evidence of acute intra-abdominal abnormality.   Electronically Signed   By: Franki Cabot M.D.   On: 09/22/2014 14:32   Objective  Filed Vitals:   09/23/14 0443 09/23/14 0952 09/23/14 1455 09/23/14 1500  BP: 134/56 146/64 156/77 152/76  Pulse: 85 89 79 83  Temp: 98 F (36.7 C) 98.3 F (36.8 C)    TempSrc:  Oral    Resp: 18 18 18 17   Height:      Weight:      SpO2: 99% 98%      Intake/Output Summary (Last 24 hours) at 09/23/14 1516 Last data filed at 09/23/14 1336  Gross per 24 hour  Intake    710 ml  Output   2000 ml  Net  -1290 ml   Filed Weights   09/22/14 1126 09/22/14 1656 09/22/14 2111  Weight: 75.5 kg (166 lb 7.2 oz) 79.5 kg (175 lb 4.3 oz) 76.204 kg (168 lb)   Exam:  General:  NAD.  Cardiovascular: RRR without MRG, 2+ peripheral pulses, no edema, no JVD  Respiratory: CTA bilaterally, good air movement, no wheezing, no crackles, no rales  Abdomen: soft, tender to palpation of LLQ and RUQ with increased tenderness in RUQ, non-distended, active bowel sounds.   MSK/Extremities: no clubbing/cyanosis, no joint swelling, right CVA tenderness  Skin: no rashes, 2+ dorsalis pedis  Neuro: alert and oriented x 3, non-focal, moves all extremities  Data Reviewed:  Basic Metabolic Panel:  Recent Labs Lab 09/22/14 1350 09/22/14 1512 09/22/14 1840 09/22/14 2221 09/23/14 0446  NA 131*  --   --  138 142  K 7.4* 6.4* 3.7 4.3 3.6  CL 97*  --   --  96* 111  CO2 17*  --   --  25 21*  GLUCOSE 308*  --   --  228* 217*  BUN 66*  --   --  24* 24*  CREATININE 9.81*  --   --  5.50* 5.35*  CALCIUM 8.8*  --   --  9.5 7.0*  PHOS  --   --   --  5.7*  --     Liver Function Tests:  Recent Labs Lab 09/22/14 1350 09/22/14 2221 09/23/14 1057  AST 60*  --  28  ALT 8*  --  15  ALKPHOS 56  --  64  BILITOT 2.3*  --  0.6  PROT 7.4  --  7.6  ALBUMIN 3.5 3.8 3.6    Recent Labs Lab 09/22/14 1350  LIPASE 35   CBC:  Recent Labs Lab 09/22/14 1350 09/23/14 0446  WBC 13.9* 10.5  NEUTROABS 12.2*  --   HGB 12.8 10.6*  HCT 37.6 32.6*  MCV 90.8 93.1  PLT 405* 321   CBG:  Recent Labs Lab 09/22/14 2107 09/22/14 2350 09/23/14 0432 09/23/14 0808 09/23/14 1254  GLUCAP 183* 214* 250* 276* 225*   Scheduled Meds: . antiseptic oral rinse  7 mL Mouth Rinse q12n4p  . aspirin  81 mg Oral Daily  . calcium acetate  667 mg Oral TID WC  . cefTRIAXone (ROCEPHIN)  IV  1 g Intravenous Q24H  . chlorhexidine  15 mL Mouth Rinse BID  . cinacalcet  60 mg Oral Q breakfast  . clopidogrel  75 mg Oral Daily  . heparin  5,000 Units Subcutaneous 3 times per day  . insulin aspart  0-20 Units Subcutaneous TID WC  . pioglitazone  15 mg Oral Daily  . sevelamer carbonate  4,000 mg Oral TID WC  . sodium chloride  3 mL Intravenous Q12H   Lenda Kelp, PA-S   Time spent: 25 minutes  Marzetta Board, MD [facility] Pager [phone]. If 7 PM - 7 AM, please contact night-coverage at [email or web address], password [facility] 09/23/2014, 3:16 PM  LOS: 0 days

## 2014-09-24 ENCOUNTER — Inpatient Hospital Stay (HOSPITAL_COMMUNITY): Payer: Medicare Other

## 2014-09-24 DIAGNOSIS — N949 Unspecified condition associated with female genital organs and menstrual cycle: Secondary | ICD-10-CM

## 2014-09-24 DIAGNOSIS — T82858D Stenosis of vascular prosthetic devices, implants and grafts, subsequent encounter: Secondary | ICD-10-CM | POA: Diagnosis not present

## 2014-09-24 DIAGNOSIS — Z992 Dependence on renal dialysis: Secondary | ICD-10-CM | POA: Diagnosis not present

## 2014-09-24 DIAGNOSIS — N186 End stage renal disease: Secondary | ICD-10-CM | POA: Diagnosis not present

## 2014-09-24 DIAGNOSIS — I871 Compression of vein: Secondary | ICD-10-CM | POA: Diagnosis not present

## 2014-09-24 DIAGNOSIS — N9489 Other specified conditions associated with female genital organs and menstrual cycle: Secondary | ICD-10-CM | POA: Diagnosis present

## 2014-09-24 LAB — RENAL FUNCTION PANEL
ALBUMIN: 3.3 g/dL — AB (ref 3.5–5.0)
ANION GAP: 13 (ref 5–15)
BUN: 33 mg/dL — AB (ref 6–20)
CALCIUM: 9 mg/dL (ref 8.9–10.3)
CO2: 27 mmol/L (ref 22–32)
CREATININE: 5.98 mg/dL — AB (ref 0.44–1.00)
Chloride: 95 mmol/L — ABNORMAL LOW (ref 101–111)
GFR calc Af Amer: 9 mL/min — ABNORMAL LOW (ref >60)
GFR calc non Af Amer: 8 mL/min — ABNORMAL LOW (ref >60)
Glucose, Bld: 197 mg/dL — ABNORMAL HIGH (ref 65–99)
Phosphorus: 4.3 mg/dL (ref 2.5–4.6)
Potassium: 4.2 mmol/L (ref 3.5–5.1)
Sodium: 135 mmol/L (ref 135–145)

## 2014-09-24 LAB — CBC
HEMATOCRIT: 37.4 % (ref 36.0–46.0)
HEMOGLOBIN: 12.2 g/dL (ref 12.0–15.0)
MCH: 30.3 pg (ref 26.0–34.0)
MCHC: 32.6 g/dL (ref 30.0–36.0)
MCV: 92.8 fL (ref 78.0–100.0)
Platelets: 388 10*3/uL (ref 150–400)
RBC: 4.03 MIL/uL (ref 3.87–5.11)
RDW: 13.9 % (ref 11.5–15.5)
WBC: 16.2 10*3/uL — ABNORMAL HIGH (ref 4.0–10.5)

## 2014-09-24 LAB — URINE CULTURE

## 2014-09-24 LAB — GLUCOSE, CAPILLARY
GLUCOSE-CAPILLARY: 145 mg/dL — AB (ref 65–99)
GLUCOSE-CAPILLARY: 220 mg/dL — AB (ref 65–99)
GLUCOSE-CAPILLARY: 446 mg/dL — AB (ref 65–99)

## 2014-09-24 MED ORDER — CIPROFLOXACIN HCL 250 MG PO TABS: 250.0000 mg | ORAL_TABLET | Freq: Every day | ORAL | Status: DC

## 2014-09-24 MED ORDER — INSULIN GLARGINE 100 UNIT/ML ~~LOC~~ SOLN
15.0000 [IU] | Freq: Every day | SUBCUTANEOUS | Status: DC
  Administered 2014-09-24: 15 [IU] via SUBCUTANEOUS
  Filled 2014-09-24 (×2): qty 0.15

## 2014-09-24 MED ORDER — PREDNISONE 50 MG PO TABS
50.0000 mg | ORAL_TABLET | Freq: Once | ORAL | Status: AC
  Administered 2014-09-24: 50 mg via ORAL
  Filled 2014-09-24: qty 1

## 2014-09-24 MED ORDER — INSULIN ASPART 100 UNIT/ML ~~LOC~~ SOLN
15.0000 [IU] | Freq: Once | SUBCUTANEOUS | Status: AC
  Administered 2014-09-24: 15 [IU] via SUBCUTANEOUS

## 2014-09-24 NOTE — Discharge Summary (Signed)
Physician Discharge Summary  Morgan [name] DOB: 1970/09/26 DOA: 09/22/2014  PCP: [name], NP  Admit date: 09/22/2014 Discharge date: 09/24/2014  Time spent: > 45 minutes  Recommendations for Outpatient Follow-up:  1. Follow up with Nephrology for HD 2. Follow up with Dr. Karsten Ro with Urology  3. Follow up with Dr. Pamala Hurry with ObGyn 4. Follow up with Vascular surgery    Discharge Diagnoses:  Active Problems:   DM (diabetes mellitus) type I controlled with renal manifestation   Leukocytosis   Hyperkalemia   Abdominal pain   Diarrhea  Discharge Condition: stable  Diet recommendation: renal  Filed Weights   09/23/14 1450 09/23/14 1755 09/23/14 2004  Weight: 77.2 kg (170 lb 3.1 oz) 75.5 kg (166 lb 7.2 oz) 76.6 kg (168 lb 14 oz)   History of present illness:  Morgan [name] is a 44 y.o. female With a history of end-stage renal disease on dialysis, diabetes mellitus, hypertension. Patient has a partial occlusion of her graft and was supposed to undergo a procedure by interventional radiology today to correct this. Because of her allergy to IV contrast, the patient was undergoing premedication with prednisone yesterday. The patient started having severe constant central abdominal pain yesterday with multiple episodes of nonbloody, watery diarrhea. The patient presented to the emergency department this morning for evaluation. Her pain radiates into her lower abdomen. She denies palliating or provoking factors. Emergency department she was noted to have hyperkalemia, although she currently has no EKG changes. She is on dialysis and has her dialysis Tuesday, Thursday, and Saturdays. She did have dialysis yesterday, however only spent 3 hours at dialysis.  Hospital Course:  Right upper quadrant/Right flank pain with right hydronephrosis - Right CVA tenderness, on admission patient underwent a CT scan which showed moderate right hydronephrosis and  ureterectasis - no ureteral calculus or other obstructing cause visualized. Urology was consulted and Dr. Karsten Ro evaluated the patient while hospitalized. He plans to do a cystoscopy, retrograde pyelography, ureteroscopic with potential stent, however there was no need for emergent intervention and this can be done as an outpatient. His office will plan to schedule this after patient's discharge. Her CVA tenderness and abdominal pain have improved significantly on discharge. Her initial urinalysis was concerning for infection, she was empirically started on ceftriaxone and this was narrowed to ciprofloxacin for 3 additional days on discharge. Urine cultures showed multiple species present. Left Adnexal mass - CT also shows a 4.6 cm cystic and solid appearing left adnexal mass, suspicious for cystic ovarian neoplasm. Patient underwent a pelvic ultrasound which shows cystic masses arising from the left ovary as well as a complex cystic mass measuring [phone] cm, representing simple and hemorrhagic cysts versus cystadenoma. I've contacted her primary OB/GYN doctor Fogleman who was on vacation, however I spoke with her nurse and the message will be related to her when she gets back. I've also discussed extensively with the patient regarding close follow-up with probable repeat ultrasound 6-12 weeks per radiology recommendations. Diarrhea - resolved Diabetes Mellitus - resume home medications End-stage renal disease, on dialysis - dialyzed per nephrology Graft partial occlusion - Vascular surgery consulted, per nephrology she go today to [place] right after discharge to have it evaluated.   Procedures:  None    Consultations:  Nephrology  Vascular surgery   Discharge Exam: Filed Vitals:   09/23/14 1755 09/23/14 2004 09/24/14 0416 09/24/14 1054  BP: 132/64 126/54 121/58 113/55  Pulse: 97 100 88 88  Temp: 98.3 F (36.8  C) 98.2 F (36.8 C) 98.1 F (36.7 C) 98.2 F (36.8 C)  TempSrc: Oral    Oral  Resp: 18 20 21 19   Height:      Weight: 75.5 kg (166 lb 7.2 oz) 76.6 kg (168 lb 14 oz)    SpO2: 100% 96% 98% 98%    General: NAD Cardiovascular: RRR Respiratory: CTA biL  Discharge Instructions     Medication List    TAKE these medications        albuterol 108 (90 BASE) MCG/ACT inhaler  Commonly known as:  PROVENTIL HFA;VENTOLIN HFA  Inhale 1-2 puffs into the lungs every 6 (six) hours as needed for wheezing or shortness of breath.     aspirin 81 MG tablet  Take 81 mg by mouth daily.     calcium acetate 667 MG capsule  Commonly known as:  PHOSLO  Take 667 mg by mouth 3 (three) times daily with meals.     cinacalcet 60 MG tablet  Commonly known as:  SENSIPAR  Take 60 mg by mouth daily.     ciprofloxacin 250 MG tablet  Commonly known as:  CIPRO  Take 1 tablet (250 mg total) by mouth daily with breakfast.     clopidogrel 75 MG tablet  Commonly known as:  PLAVIX  Take 75 mg by mouth daily.     DIALYVITE TABLET Tabs  Take 1 tablet by mouth daily at 12 noon.     glucose blood test strip  Commonly known as:  ONE TOUCH ULTRA TEST  1 each by Other route 2 (two) times daily. And lancets 2/day     insulin lispro 100 UNIT/ML KiwkPen  Commonly known as:  HUMALOG KWIKPEN  Inject 0.1 mLs (10 Units total) into the skin 3 (three) times daily with meals. And pen needles 3/day     Insulin Pen Needle 31G X 5 MM Misc  Commonly known as:  DROPLET PEN NEEDLES  Use to inject insulin 3 times per day     levonorgestrel 20 MCG/24HR IUD  Commonly known as:  MIRENA  1 each by Intrauterine route once.     lidocaine-prilocaine cream  Commonly known as:  EMLA  Apply 1 application topically daily as needed (dialysis).     pioglitazone 15 MG tablet  Commonly known as:  ACTOS  Take 15 mg by mouth daily.     sevelamer carbonate 800 MG tablet  Commonly known as:  RENVELA  Take 4,000-4,800 mg by mouth 3 (three) times daily with meals.           Follow-up Information     Follow up with Claybon Jabs, MD.   Specialty:  Urology   Why:  We will contact you with a date and time of your planned procedure.   Contact information:   [address] [place] [address] [phone]       Follow up with MABE,DAVID, NP In 1 month.   Specialty:  Family Medicine   Contact information:   [place] [place] [address] [phone]       Follow up with Aloha Gell A., MD. Schedule an appointment as soon as possible for a visit in 3 weeks.   Specialty:  Obstetrics and Gynecology   Contact information:   [place] [address] [phone]       The results of significant diagnostics from this hospitalization (including imaging, microbiology, ancillary and laboratory) are listed below for reference.    Significant Diagnostic Studies:  US Transvaginal Non-ob  09/24/2014   CLINICAL DATA:  Complex left adnexal mass seen on recent CT  EXAM: TRANSABDOMINAL AND TRANSVAGINAL ULTRASOUND OF PELVIS  TECHNIQUE: Study was performed transabdominally to optimize pelvic field of view evaluation and transvaginally to optimize internal visceral architecture evaluation.  COMPARISON:  CT abdomen and pelvis September 23, 2014  FINDINGS: Uterus  Measurements: 8.8 x 3.9 x 4.3 cm. No fibroids or other mass visualized.  Endometrium  Thickness: 11 mm. There is no intrauterine mass. Intrauterine device is positioned within the endometrium.  Right ovary  Measurements: 2.2 x 2.0 x 2.0 cm. Normal appearance/no adnexal mass.  Left ovary  Measurements: 4.5 x 2.7 x 4.6 cm. There is a cystic mass arising from the left ovary measuring 3.6 x 2.6 x 2.6 cm. There is a hypoechoic mass also arising from the left ovary measuring 2.2 x 2.2 x 1.9 cm. There is also a probable follicle measuring 1.8 x 1.5 x 1.8 cm.  Other findings  No free fluid.  IMPRESSION: There is an intrauterine device positioned within the endometrium. Uterus otherwise appears unremarkable.  There are  cystic masses arising from left ovary as well as a complex cystic mass measuring 2.2 x 2.2 x 1.9 cm. While these cystic masses likely represent simple and hemorrhagic cysts, the possibility of cystadenoma must be of concern. Short-interval follow up ultrasound in 6-12 weeks is recommended, preferably during the week following the patient's normal menses.   Electronically Signed   By: Lowella Grip III M.D.   On: 09/24/2014 07:58   US Pelvis Complete  09/24/2014   CLINICAL DATA:  Complex left adnexal mass seen on recent CT  EXAM: TRANSABDOMINAL AND TRANSVAGINAL ULTRASOUND OF PELVIS  TECHNIQUE: Study was performed transabdominally to optimize pelvic field of view evaluation and transvaginally to optimize internal visceral architecture evaluation.  COMPARISON:  CT abdomen and pelvis September 23, 2014  FINDINGS: Uterus  Measurements: 8.8 x 3.9 x 4.3 cm. No fibroids or other mass visualized.  Endometrium  Thickness: 11 mm. There is no intrauterine mass. Intrauterine device is positioned within the endometrium.  Right ovary  Measurements: 2.2 x 2.0 x 2.0 cm. Normal appearance/no adnexal mass.  Left ovary  Measurements: 4.5 x 2.7 x 4.6 cm. There is a cystic mass arising from the left ovary measuring 3.6 x 2.6 x 2.6 cm. There is a hypoechoic mass also arising from the left ovary measuring 2.2 x 2.2 x 1.9 cm. There is also a probable follicle measuring 1.8 x 1.5 x 1.8 cm.  Other findings  No free fluid.  IMPRESSION: There is an intrauterine device positioned within the endometrium. Uterus otherwise appears unremarkable.  There are cystic masses arising from left ovary as well as a complex cystic mass measuring 2.2 x 2.2 x 1.9 cm. While these cystic masses likely represent simple and hemorrhagic cysts, the possibility of cystadenoma must be of concern. Short-interval follow up ultrasound in 6-12 weeks is recommended, preferably during the week following the patient's normal menses.   Electronically Signed   By: Lowella Grip III M.D.   On: 09/24/2014 07:58   Ct Abdomen Pelvis W Contrast  09/23/2014   CLINICAL DATA:  Right upper quadrant and right flank pain and tenderness.  EXAM: CT ABDOMEN AND PELVIS WITH CONTRAST  TECHNIQUE: Multidetector CT imaging of the abdomen and pelvis was performed using the standard protocol following bolus administration of intravenous contrast.  CONTRAST:  80 mL Omnipaque 300  COMPARISON:  Noncontrast CT on 05/19/2006  FINDINGS: Lower Chest: No acute findings.  Hepatobiliary: No masses or other significant abnormality identified. Gallbladder is unremarkable.  Pancreas: No mass, inflammatory changes, or other significant abnormality identified.  Spleen:  Within normal limits in size and appearance.  Adrenals:  No masses identified.  Kidneys/Urinary Tract: Progressive diffuse bilateral renal parenchymal atrophy is seen compared to previous study. Renal vascular calcification noted. No definite renal calculi identified.  Moderate right hydronephrosis and ureterectasis is seen which is new since previous study, however no obstructing calculus visualized. The urinary bladder is nearly completely empty and not well visualized.  Stomach/Bowel/Peritoneum: No evidence of wall thickening, mass, or obstruction. Normal appendix visualized.  Vascular/Lymphatic: No pathologically enlarged lymph nodes identified. No abdominal aortic aneurysm or other significant retroperitoneal abnormality demonstrated.  Reproductive: IUD seen in appropriate position. A complex cystic lesion is seen in the left adnexa which measures 2.8 x 4.6 cm and has a mural nodular density measuring 2.4 cm. This is new since previous study and suspicious for cystic ovarian neoplasm. No evidence of free fluid.  Other:  None.  Musculoskeletal:  No suspicious bone lesions identified.  IMPRESSION: Moderate right hydronephrosis and ureterectasis. No ureteral calculus or other obstructing etiology visualized radiographically. Consider urology  referral and retrograde ureteroscopy for further evaluation.  Diffuse bilateral renal parenchymal atrophy which shows significant progression since 2008 exam.  4.6 cm cystic and solid appearing left adnexal mass, suspicious for cystic ovarian neoplasm. Recommend pelvic ultrasound for further evaluation.  IUD in appropriate position.   Electronically Signed   By: Earle Gell M.D.   On: 09/23/2014 14:09   Dg Abd Acute W/chest  09/22/2014   CLINICAL DATA:  Central and right-sided abdominal pain since last night with radiation to back. Dialysis patient. History of hypertension and diabetes.  EXAM: DG ABDOMEN ACUTE W/ 1V CHEST  COMPARISON:  None.  FINDINGS: Single view of the chest: Cardiomediastinal silhouette is within normal limits in size and configuration. Lungs are clear. No evidence of pneumonia. No pleural effusion. No pneumothorax. Axillary/subclavian vessels stents noted on the left. No osseous abnormality.  Decubitus and supine views of the abdomen: Bowel gas pattern is nonobstructive. No evidence of free intraperitoneal air. No evidence of soft tissue mass or abnormal fluid collection. No pathologic -appearing calcifications. Intrauterine device noted within the central pelvis. No osseous abnormality.  IMPRESSION: No evidence of acute cardiopulmonary abnormality.  Lungs are clear.  Nonobstructive bowel gas pattern and no evidence of acute intra-abdominal abnormality.   Electronically Signed   By: Franki Cabot M.D.   On: 09/22/2014 14:32    Microbiology: Recent Results (from the past 240 hour(s))  Culture, Urine     Status: None   Collection Time: 09/23/14 12:30 PM  Result Value Ref Range Status   Specimen Description URINE, RANDOM  Final   Special Requests NONE  Final   Culture MULTIPLE SPECIES PRESENT, SUGGEST RECOLLECTION  Final   Report Status 09/24/2014 FINAL  Final     Labs: Basic Metabolic Panel:  Recent Labs Lab 09/22/14 1350 09/22/14 1512 09/22/14 1840 09/22/14 2221  09/23/14 0446 09/24/14 0442  NA 131*  --   --  138 142 135  K 7.4* 6.4* 3.7 4.3 3.6 4.2  CL 97*  --   --  96* 111 95*  CO2 17*  --   --  25 21* 27  GLUCOSE 308*  --   --  228* 217* 197*  BUN 66*  --   --  24* 24* 33*  CREATININE 9.81*  --   --  5.50* 5.35* 5.98*  CALCIUM 8.8*  --   --  9.5 7.0* 9.0  PHOS  --   --   --  5.7*  --  4.3   Liver Function Tests:  Recent Labs Lab 09/22/14 1350 09/22/14 2221 09/23/14 1057 09/24/14 0442  AST 60*  --  28  --   ALT 8*  --  15  --   ALKPHOS 56  --  64  --   BILITOT 2.3*  --  0.6  --   PROT 7.4  --  7.6  --   ALBUMIN 3.5 3.8 3.6 3.3*    Recent Labs Lab 09/22/14 1350  LIPASE 35   CBC:  Recent Labs Lab 09/22/14 1350 09/23/14 0446 09/24/14 0442  WBC 13.9* 10.5 16.2*  NEUTROABS 12.2*  --   --   HGB 12.8 10.6* 12.2  HCT 37.6 32.6* 37.4  MCV 90.8 93.1 92.8  PLT 405* 321 388    CBG:  Recent Labs Lab 09/23/14 1254 09/23/14 2002 09/24/14 0007 09/24/14 0412 09/24/14 0749  GLUCAP 225* 378* 446* 220* 145*    Signed:  Marzetta Board  [facility] 09/24/2014, 6:30 PM

## 2014-09-24 NOTE — Progress Notes (Signed)
Patient received 200 mg of Solucortef + 3 doses of 50 mg of PO Prednisone. Glucose increased into the 400's. Agree with current medication regimen. Will watch trend while inpatient.  Thanks,  Tama Headings RN, MSN, [facility] Inpatient Diabetes Coordinator Team Pager [phone]

## 2014-09-24 NOTE — Discharge Instructions (Signed)
Follow with MABE,DAVID, NP in 1-2 weeks  Please get a complete blood count and chemistry panel checked by your Primary MD at your next visit, and again as instructed by your Primary MD. Please get your medications reviewed and adjusted by your Primary MD.  Please request your Primary MD to go over all Hospital Tests and Procedure/Radiological results at the follow up, please get all Hospital records sent to your Prim MD by signing hospital release before you go home.  If you had Pneumonia of Lung problems at the Hospital: Please get a 2 view Chest X ray done in 6-8 weeks after hospital discharge or sooner if instructed by your Primary MD.  If you have Congestive Heart Failure: Please call your Cardiologist or Primary MD anytime you have any of the following symptoms:  1) 3 pound weight gain in 24 hours or 5 pounds in 1 week  2) shortness of breath, with or without a dry hacking cough  3) swelling in the hands, feet or stomach  4) if you have to sleep on extra pillows at night in order to breathe  Follow cardiac low salt diet and 1.5 lit/day fluid restriction.  If you have diabetes Accuchecks 4 times/day, Once in AM empty stomach and then before each meal. Log in all results and show them to your primary doctor at your next visit. If any glucose reading is under 80 or above 300 call your primary MD immediately.  If you have Seizure/Convulsions/Epilepsy: Please do not drive, operate heavy machinery, participate in activities at heights or participate in high speed sports until you have seen by Primary MD or a Neurologist and advised to do so again.  If you had Gastrointestinal Bleeding: Please ask your Primary MD to check a complete blood count within one week of discharge or at your next visit. Your endoscopic/colonoscopic biopsies that are pending at the time of discharge, will also need to followed by your Primary MD.  Get Medicines reviewed and adjusted. Please take all your  medications with you for your next visit with your Primary MD  Please request your Primary MD to go over all hospital tests and procedure/radiological results at the follow up, please ask your Primary MD to get all Hospital records sent to his/her office.  If you experience worsening of your admission symptoms, develop shortness of breath, life threatening emergency, suicidal or homicidal thoughts you must seek medical attention immediately by calling 911 or calling your MD immediately  if symptoms less severe.  You must read complete instructions/literature along with all the possible adverse reactions/side effects for all the Medicines you take and that have been prescribed to you. Take any new Medicines after you have completely understood and accpet all the possible adverse reactions/side effects.   Do not drive or operate heavy machinery when taking Pain medications.   Do not take more than prescribed Pain, Sleep and Anxiety Medications  Special Instructions: If you have smoked or chewed Tobacco  in the last 2 yrs please stop smoking, stop any regular Alcohol  and or any Recreational drug use.  Wear Seat belts while driving.  Please note You were cared for by a hospitalist during your hospital stay. If you have any questions about your discharge medications or the care you received while you were in the hospital after you are discharged, you can call the unit and asked to speak with the hospitalist on call if the hospitalist that took care of you is not available. Once you  are discharged, your primary care physician will handle any further medical issues. Please note that NO REFILLS for any discharge medications will be authorized once you are discharged, as it is imperative that you return to your primary care physician (or establish a relationship with a primary care physician if you do not have one) for your aftercare needs so that they can reassess your need for medications and monitor your  lab values.  You can reach the hospitalist office at phone [phone] or fax [phone]   If you do not have a primary care physician, you can call [phone] for a physician referral.  Activity: As tolerated with Full fall precautions use walker/cane & assistance as needed  Diet: renal  Disposition Home

## 2014-09-24 NOTE — Progress Notes (Signed)
[facility] Progress Note  Assessment/Plan: 1. Hyperkalemia: Resolved.  2. Decreased AF in AVG: AF 1702 on 08/28/14 Repeat AF 470 (09/18/14) and 452 (09/21/14). Seen per Dr. Scot Dock yesterday. VVS following. 3. Right flank pain with right hydronephrosis:  Per primary: Urology consulted and following. Also found to have L adnexal mass. OB/GYN consulted. 4. ESRD - T,T,S at [place]. Heparin 3500 units at center. Had HD yesterday to resume TTS schedule 5. Hypertension/volume - HD 09/23/14 Net UF 1700 Post Wt 75.5 which is OP EDW.  Will have HD tomorrow in center if [place]. 6. Anemia - HGB 12.2 today. No ESAs. Follow CBCs.  7. Metabolic bone disease - Ca 9.0 C Ca 9.6  Phos 4.3 .Continue out patient binders.  8. Nutrition - Renal diet with fld restrictions. Renal vit.  9. DM/Hyperglycemia: SS per primary.   Rita H. Brown NP-C 09/24/2014, 9:27 AM  [facility] [phone]  Pt seen, examined and agree w A/P as above.  Kelly Splinter MD pager [phone]    cell [phone] 09/24/2014, 1:21 PM    Subjective:  "I feel a lot better". No C/O Abdominal pain. Sitting up in bed, talking on phone.  Objective Filed Vitals:   09/23/14 1729 09/23/14 1755 09/23/14 2004 09/24/14 0416  BP: 142/65 132/64 126/54 121/58  Pulse: 98 97 100 88  Temp:  98.3 F (36.8 C) 98.2 F (36.8 C) 98.1 F (36.7 C)  TempSrc:  Oral    Resp: 16 18 20 21   Height:      Weight:  75.5 kg (166 lb 7.2 oz) 76.6 kg (168 lb 14 oz)   SpO2:  100% 96% 98%   General: Well nourished, alert, NAD Heart: S1,S2, RRR  Lungs: Bilateral breath sounds CTA Abdomen: soft still tender upper quadrants no distention. Extremities: No edema.  Dialysis Access: LUA AVG + Thrill + Bruit. High pitched bruit on RL limb of AVG  Dialysis Orders: Center: [facility] on TTS . EDW 75.5 KG HD Bath 2.0 K 2.0 K 1 mg Time 4 hours Heparin 3500 Units. Access LUA AVG BFR 400 DFR Autoflow 1.5  Hectoral 4 mcg IV T,T,S (last dose  0726/16)  Additional Objective Labs: Basic Metabolic Panel:  Recent Labs Lab 09/22/14 2221 09/23/14 0446 09/24/14 0442  NA 138 142 135  K 4.3 3.6 4.2  CL 96* 111 95*  CO2 25 21* 27  GLUCOSE 228* 217* 197*  BUN 24* 24* 33*  CREATININE 5.50* 5.35* 5.98*  CALCIUM 9.5 7.0* 9.0  PHOS 5.7*  --  4.3   Liver Function Tests:  Recent Labs Lab 09/22/14 1350 09/22/14 2221 09/23/14 1057 09/24/14 0442  AST 60*  --  28  --   ALT 8*  --  15  --   ALKPHOS 56  --  64  --   BILITOT 2.3*  --  0.6  --   PROT 7.4  --  7.6  --   ALBUMIN 3.5 3.8 3.6 3.3*    Recent Labs Lab 09/22/14 1350  LIPASE 35   CBC:  Recent Labs Lab 09/22/14 1350 09/23/14 0446 09/24/14 0442  WBC 13.9* 10.5 16.2*  NEUTROABS 12.2*  --   --   HGB 12.8 10.6* 12.2  HCT 37.6 32.6* 37.4  MCV 90.8 93.1 92.8  PLT 405* 321 388   Blood Culture No results found for: SDES, SPECREQUEST, CULT, REPTSTATUS  Cardiac Enzymes: No results for input(s): CKTOTAL, CKMB, CKMBINDEX, TROPONINI in the last 168 hours. CBG:  Recent Labs Lab 09/23/14  1254 09/23/14 2002 09/24/14 0007 09/24/14 0412 09/24/14 0749  GLUCAP 225* 378* 446* 220* 145*   Iron Studies: No results for input(s): IRON, TIBC, TRANSFERRIN, FERRITIN in the last 72 hours. @lablastinr3 @ Studies/Results: US Transvaginal Non-ob  09/24/2014   CLINICAL DATA:  Complex left adnexal mass seen on recent CT  EXAM: TRANSABDOMINAL AND TRANSVAGINAL ULTRASOUND OF PELVIS  TECHNIQUE: Study was performed transabdominally to optimize pelvic field of view evaluation and transvaginally to optimize internal visceral architecture evaluation.  COMPARISON:  CT abdomen and pelvis September 23, 2014  FINDINGS: Uterus  Measurements: 8.8 x 3.9 x 4.3 cm. No fibroids or other mass visualized.  Endometrium  Thickness: 11 mm. There is no intrauterine mass. Intrauterine device is positioned within the endometrium.  Right ovary  Measurements: 2.2 x 2.0 x 2.0 cm. Normal appearance/no adnexal mass.   Left ovary  Measurements: 4.5 x 2.7 x 4.6 cm. There is a cystic mass arising from the left ovary measuring 3.6 x 2.6 x 2.6 cm. There is a hypoechoic mass also arising from the left ovary measuring 2.2 x 2.2 x 1.9 cm. There is also a probable follicle measuring 1.8 x 1.5 x 1.8 cm.  Other findings  No free fluid.  IMPRESSION: There is an intrauterine device positioned within the endometrium. Uterus otherwise appears unremarkable.  There are cystic masses arising from left ovary as well as a complex cystic mass measuring 2.2 x 2.2 x 1.9 cm. While these cystic masses likely represent simple and hemorrhagic cysts, the possibility of cystadenoma must be of concern. Short-interval follow up ultrasound in 6-12 weeks is recommended, preferably during the week following the patient's normal menses.   Electronically Signed   By: Lowella Grip III M.D.   On: 09/24/2014 07:58   US Pelvis Complete  09/24/2014   CLINICAL DATA:  Complex left adnexal mass seen on recent CT  EXAM: TRANSABDOMINAL AND TRANSVAGINAL ULTRASOUND OF PELVIS  TECHNIQUE: Study was performed transabdominally to optimize pelvic field of view evaluation and transvaginally to optimize internal visceral architecture evaluation.  COMPARISON:  CT abdomen and pelvis September 23, 2014  FINDINGS: Uterus  Measurements: 8.8 x 3.9 x 4.3 cm. No fibroids or other mass visualized.  Endometrium  Thickness: 11 mm. There is no intrauterine mass. Intrauterine device is positioned within the endometrium.  Right ovary  Measurements: 2.2 x 2.0 x 2.0 cm. Normal appearance/no adnexal mass.  Left ovary  Measurements: 4.5 x 2.7 x 4.6 cm. There is a cystic mass arising from the left ovary measuring 3.6 x 2.6 x 2.6 cm. There is a hypoechoic mass also arising from the left ovary measuring 2.2 x 2.2 x 1.9 cm. There is also a probable follicle measuring 1.8 x 1.5 x 1.8 cm.  Other findings  No free fluid.  IMPRESSION: There is an intrauterine device positioned within the endometrium.  Uterus otherwise appears unremarkable.  There are cystic masses arising from left ovary as well as a complex cystic mass measuring 2.2 x 2.2 x 1.9 cm. While these cystic masses likely represent simple and hemorrhagic cysts, the possibility of cystadenoma must be of concern. Short-interval follow up ultrasound in 6-12 weeks is recommended, preferably during the week following the patient's normal menses.   Electronically Signed   By: Lowella Grip III M.D.   On: 09/24/2014 07:58   Ct Abdomen Pelvis W Contrast  09/23/2014   CLINICAL DATA:  Right upper quadrant and right flank pain and tenderness.  EXAM: CT ABDOMEN AND PELVIS WITH CONTRAST  TECHNIQUE:  Multidetector CT imaging of the abdomen and pelvis was performed using the standard protocol following bolus administration of intravenous contrast.  CONTRAST:  80 mL Omnipaque 300  COMPARISON:  Noncontrast CT on 05/19/2006  FINDINGS: Lower Chest: No acute findings.  Hepatobiliary: No masses or other significant abnormality identified. Gallbladder is unremarkable.  Pancreas: No mass, inflammatory changes, or other significant abnormality identified.  Spleen:  Within normal limits in size and appearance.  Adrenals:  No masses identified.  Kidneys/Urinary Tract: Progressive diffuse bilateral renal parenchymal atrophy is seen compared to previous study. Renal vascular calcification noted. No definite renal calculi identified.  Moderate right hydronephrosis and ureterectasis is seen which is new since previous study, however no obstructing calculus visualized. The urinary bladder is nearly completely empty and not well visualized.  Stomach/Bowel/Peritoneum: No evidence of wall thickening, mass, or obstruction. Normal appendix visualized.  Vascular/Lymphatic: No pathologically enlarged lymph nodes identified. No abdominal aortic aneurysm or other significant retroperitoneal abnormality demonstrated.  Reproductive: IUD seen in appropriate position. A complex cystic  lesion is seen in the left adnexa which measures 2.8 x 4.6 cm and has a mural nodular density measuring 2.4 cm. This is new since previous study and suspicious for cystic ovarian neoplasm. No evidence of free fluid.  Other:  None.  Musculoskeletal:  No suspicious bone lesions identified.  IMPRESSION: Moderate right hydronephrosis and ureterectasis. No ureteral calculus or other obstructing etiology visualized radiographically. Consider urology referral and retrograde ureteroscopy for further evaluation.  Diffuse bilateral renal parenchymal atrophy which shows significant progression since 2008 exam.  4.6 cm cystic and solid appearing left adnexal mass, suspicious for cystic ovarian neoplasm. Recommend pelvic ultrasound for further evaluation.  IUD in appropriate position.   Electronically Signed   By: Earle Gell M.D.   On: 09/23/2014 14:09   Dg Abd Acute W/chest  09/22/2014   CLINICAL DATA:  Central and right-sided abdominal pain since last night with radiation to back. Dialysis patient. History of hypertension and diabetes.  EXAM: DG ABDOMEN ACUTE W/ 1V CHEST  COMPARISON:  None.  FINDINGS: Single view of the chest: Cardiomediastinal silhouette is within normal limits in size and configuration. Lungs are clear. No evidence of pneumonia. No pleural effusion. No pneumothorax. Axillary/subclavian vessels stents noted on the left. No osseous abnormality.  Decubitus and supine views of the abdomen: Bowel gas pattern is nonobstructive. No evidence of free intraperitoneal air. No evidence of soft tissue mass or abnormal fluid collection. No pathologic -appearing calcifications. Intrauterine device noted within the central pelvis. No osseous abnormality.  IMPRESSION: No evidence of acute cardiopulmonary abnormality.  Lungs are clear.  Nonobstructive bowel gas pattern and no evidence of acute intra-abdominal abnormality.   Electronically Signed   By: Franki Cabot M.D.   On: 09/22/2014 14:32   Medications:   .  antiseptic oral rinse  7 mL Mouth Rinse q12n4p  . aspirin  81 mg Oral Daily  . calcium acetate  667 mg Oral TID WC  . cefTRIAXone (ROCEPHIN)  IV  1 g Intravenous Q24H  . chlorhexidine  15 mL Mouth Rinse BID  . cinacalcet  60 mg Oral Q breakfast  . clopidogrel  75 mg Oral Daily  . heparin  5,000 Units Subcutaneous 3 times per day  . insulin aspart  0-20 Units Subcutaneous TID WC  . insulin aspart  3 Units Subcutaneous TID WC  . insulin glargine  15 Units Subcutaneous QHS  . pioglitazone  15 mg Oral Daily  . sevelamer carbonate  4,000 mg Oral  TID WC  . sodium chloride  3 mL Intravenous Q12H

## 2014-09-24 NOTE — Progress Notes (Signed)
Morgan [name] to be D/C'd Home per MD order. Gave patient Prednisone before discharge per Dr. Augustin Coupe.  Discussed prescriptions and follow up appointments with the patient. Prescriptions given to patient, medication list explained in detail. Pt verbalized understanding.    Medication List    TAKE these medications        albuterol 108 (90 BASE) MCG/ACT inhaler  Commonly known as:  PROVENTIL HFA;VENTOLIN HFA  Inhale 1-2 puffs into the lungs every 6 (six) hours as needed for wheezing or shortness of breath.     aspirin 81 MG tablet  Take 81 mg by mouth daily.     calcium acetate 667 MG capsule  Commonly known as:  PHOSLO  Take 667 mg by mouth 3 (three) times daily with meals.     cinacalcet 60 MG tablet  Commonly known as:  SENSIPAR  Take 60 mg by mouth daily.     ciprofloxacin 250 MG tablet  Commonly known as:  CIPRO  Take 1 tablet (250 mg total) by mouth daily with breakfast.     clopidogrel 75 MG tablet  Commonly known as:  PLAVIX  Take 75 mg by mouth daily.     DIALYVITE TABLET Tabs  Take 1 tablet by mouth daily at 12 noon.     glucose blood test strip  Commonly known as:  ONE TOUCH ULTRA TEST  1 each by Other route 2 (two) times daily. And lancets 2/day     insulin lispro 100 UNIT/ML KiwkPen  Commonly known as:  HUMALOG KWIKPEN  Inject 0.1 mLs (10 Units total) into the skin 3 (three) times daily with meals. And pen needles 3/day     Insulin Pen Needle 31G X 5 MM Misc  Commonly known as:  DROPLET PEN NEEDLES  Use to inject insulin 3 times per day     levonorgestrel 20 MCG/24HR IUD  Commonly known as:  MIRENA  1 each by Intrauterine route once.     lidocaine-prilocaine cream  Commonly known as:  EMLA  Apply 1 application topically daily as needed (dialysis).     pioglitazone 15 MG tablet  Commonly known as:  ACTOS  Take 15 mg by mouth daily.     sevelamer carbonate 800 MG tablet  Commonly known as:  RENVELA  Take 4,000-4,800 mg by mouth 3 (three)  times daily with meals.        Filed Vitals:   09/24/14 1054  BP: 113/55  Pulse: 88  Temp: 98.2 F (36.8 C)  Resp: 19    Skin clean, dry and intact without evidence of skin break down, no evidence of skin tears noted. IV catheter discontinued intact. Site without signs and symptoms of complications. Dressing and pressure applied. Pt denies pain at this time. No complaints noted.  An After Visit Summary was printed and given to the patient. Patient escorted via [place], and D/C home via private auto.  [name] A 09/24/2014 12:20 PM

## 2014-09-24 NOTE — Progress Notes (Signed)
Patient ID: Morgan [name], female   DOB: 03-May-1970, 44 y.o.   MRN: [id number]  Subjective: The patient reports no further abdominal or flank pain.  Objective: Vital signs in last 24 hours: Temp:  [97.9 F (36.6 C)-98.3 F (36.8 C)] 98.1 F (36.7 C) (07/29 0416) Pulse Rate:  [79-100] 88 (07/29 0416) Resp:  [13-21] 21 (07/29 0416) BP: (121-159)/(54-78) 121/58 mmHg (07/29 0416) SpO2:  [96 %-100 %] 98 % (07/29 0416) Weight:  [75.5 kg (166 lb 7.2 oz)-77.2 kg (170 lb 3.1 oz)] 76.6 kg (168 lb 14 oz) (07/28 2004)A  Intake/Output from previous day: 07/28 0701 - 07/29 0700 In: 240 [P.O.:240] Out: 1850 [Urine:150] Intake/Output this shift:    Past Medical History  Diagnosis Date  . Diabetes mellitus without complication   . Renal disorder   . Hypertension   . Pneumonia     Physical Exam:  Lungs - Normal respiratory effort, chest expands symmetrically.  Abdomen - Soft, non-tender & non-distended.  Lab Results:  Recent Labs  09/22/14 1350 09/23/14 0446 09/24/14 0442  WBC 13.9* 10.5 16.2*  HGB 12.8 10.6* 12.2  HCT 37.6 32.6* 37.4   BMET  Recent Labs  09/23/14 0446 09/24/14 0442  NA 142 135  K 3.6 4.2  CL 111 95*  CO2 21* 27  GLUCOSE 217* 197*  BUN 24* 33*  CREATININE 5.35* 5.98*  CALCIUM 7.0* 9.0   No results for input(s): LABURIN in the last 72 hours. Results for orders placed or performed during the hospital encounter of 05/08/12  MRSA PCR Screening     Status: None   Collection Time: 05/08/12  9:36 PM  Result Value Ref Range Status   MRSA by PCR NEGATIVE NEGATIVE Final    Comment:        The GeneXpert MRSA Assay (FDA approved for NASAL specimens only), is one component of a comprehensive MRSA colonization surveillance program. It is not intended to diagnose MRSA infection nor to guide or monitor treatment for MRSA infections.    Studies/Results: Ct Abdomen Pelvis W Contrast  09/23/2014   CLINICAL DATA:  Right upper quadrant and right  flank pain and tenderness.  EXAM: CT ABDOMEN AND PELVIS WITH CONTRAST  TECHNIQUE: Multidetector CT imaging of the abdomen and pelvis was performed using the standard protocol following bolus administration of intravenous contrast.  CONTRAST:  80 mL Omnipaque 300  COMPARISON:  Noncontrast CT on 05/19/2006  FINDINGS: Lower Chest: No acute findings.  Hepatobiliary: No masses or other significant abnormality identified. Gallbladder is unremarkable.  Pancreas: No mass, inflammatory changes, or other significant abnormality identified.  Spleen:  Within normal limits in size and appearance.  Adrenals:  No masses identified.  Kidneys/Urinary Tract: Progressive diffuse bilateral renal parenchymal atrophy is seen compared to previous study. Renal vascular calcification noted. No definite renal calculi identified.  Moderate right hydronephrosis and ureterectasis is seen which is new since previous study, however no obstructing calculus visualized. The urinary bladder is nearly completely empty and not well visualized.  Stomach/Bowel/Peritoneum: No evidence of wall thickening, mass, or obstruction. Normal appendix visualized.  Vascular/Lymphatic: No pathologically enlarged lymph nodes identified. No abdominal aortic aneurysm or other significant retroperitoneal abnormality demonstrated.  Reproductive: IUD seen in appropriate position. A complex cystic lesion is seen in the left adnexa which measures 2.8 x 4.6 cm and has a mural nodular density measuring 2.4 cm. This is new since previous study and suspicious for cystic ovarian neoplasm. No evidence of free fluid.  Other:  None.  Musculoskeletal:  No suspicious bone lesions identified.  IMPRESSION: Moderate right hydronephrosis and ureterectasis. No ureteral calculus or other obstructing etiology visualized radiographically. Consider urology referral and retrograde ureteroscopy for further evaluation.  Diffuse bilateral renal parenchymal atrophy which shows significant  progression since 2008 exam.  4.6 cm cystic and solid appearing left adnexal mass, suspicious for cystic ovarian neoplasm. Recommend pelvic ultrasound for further evaluation.  IUD in appropriate position.   Electronically Signed   By: Earle Gell M.D.   On: 09/23/2014 14:09    Assessment: Her flank pain has resolved. Urine culture remains pending.  Plan:  1. Treat culture positivity per sensitivities. 2. I will schedule her for further evaluation as an outpatient.  Morgan [name] C 09/24/2014, 7:34 AM

## 2014-09-26 DIAGNOSIS — E1129 Type 2 diabetes mellitus with other diabetic kidney complication: Secondary | ICD-10-CM | POA: Diagnosis not present

## 2014-09-26 DIAGNOSIS — Z992 Dependence on renal dialysis: Secondary | ICD-10-CM | POA: Diagnosis not present

## 2014-09-26 DIAGNOSIS — N186 End stage renal disease: Secondary | ICD-10-CM | POA: Diagnosis not present

## 2014-09-27 ENCOUNTER — Other Ambulatory Visit: Payer: Self-pay | Admitting: Urology

## 2014-09-27 MED ORDER — IOHEXOL 300 MG/ML  SOLN
80.0000 mL | Freq: Once | INTRAMUSCULAR | Status: AC | PRN
  Administered 2014-09-27: 80 mL via INTRAVENOUS

## 2014-09-28 DIAGNOSIS — D631 Anemia in chronic kidney disease: Secondary | ICD-10-CM | POA: Diagnosis not present

## 2014-09-28 DIAGNOSIS — N186 End stage renal disease: Secondary | ICD-10-CM | POA: Diagnosis not present

## 2014-09-28 DIAGNOSIS — N2581 Secondary hyperparathyroidism of renal origin: Secondary | ICD-10-CM | POA: Diagnosis not present

## 2014-09-28 DIAGNOSIS — E1129 Type 2 diabetes mellitus with other diabetic kidney complication: Secondary | ICD-10-CM | POA: Diagnosis not present

## 2014-09-29 DIAGNOSIS — F4323 Adjustment disorder with mixed anxiety and depressed mood: Secondary | ICD-10-CM | POA: Diagnosis not present

## 2014-10-01 DIAGNOSIS — I771 Stricture of artery: Secondary | ICD-10-CM | POA: Diagnosis not present

## 2014-10-01 DIAGNOSIS — T82858D Stenosis of vascular prosthetic devices, implants and grafts, subsequent encounter: Secondary | ICD-10-CM | POA: Diagnosis not present

## 2014-10-01 DIAGNOSIS — N186 End stage renal disease: Secondary | ICD-10-CM | POA: Diagnosis not present

## 2014-10-01 DIAGNOSIS — Z992 Dependence on renal dialysis: Secondary | ICD-10-CM | POA: Diagnosis not present

## 2014-10-05 DIAGNOSIS — F4323 Adjustment disorder with mixed anxiety and depressed mood: Secondary | ICD-10-CM | POA: Diagnosis not present

## 2014-10-06 ENCOUNTER — Ambulatory Visit (INDEPENDENT_AMBULATORY_CARE_PROVIDER_SITE_OTHER): Payer: Medicare Other

## 2014-10-06 ENCOUNTER — Encounter (HOSPITAL_COMMUNITY): Payer: Self-pay | Admitting: Family Medicine

## 2014-10-06 ENCOUNTER — Emergency Department (HOSPITAL_COMMUNITY): Payer: Medicare Other

## 2014-10-06 ENCOUNTER — Ambulatory Visit (INDEPENDENT_AMBULATORY_CARE_PROVIDER_SITE_OTHER): Payer: Medicare Other | Admitting: Family Medicine

## 2014-10-06 ENCOUNTER — Inpatient Hospital Stay (HOSPITAL_COMMUNITY)
Admission: EM | Admit: 2014-10-06 | Discharge: 2014-10-09 | DRG: 444 | Disposition: A | Discharge status: Home / Self Care | Payer: Medicare Other | Attending: Internal Medicine | Admitting: Internal Medicine

## 2014-10-06 VITALS — BP 128/80 | HR 94 | Temp 98.6°F | Resp 16 | Ht 62.0 in | Wt 168.0 lb

## 2014-10-06 DIAGNOSIS — K828 Other specified diseases of gallbladder: Secondary | ICD-10-CM | POA: Diagnosis present

## 2014-10-06 DIAGNOSIS — Z992 Dependence on renal dialysis: Secondary | ICD-10-CM | POA: Diagnosis not present

## 2014-10-06 DIAGNOSIS — N186 End stage renal disease: Secondary | ICD-10-CM

## 2014-10-06 DIAGNOSIS — N2581 Secondary hyperparathyroidism of renal origin: Secondary | ICD-10-CM | POA: Diagnosis present

## 2014-10-06 DIAGNOSIS — Z794 Long term (current) use of insulin: Secondary | ICD-10-CM

## 2014-10-06 DIAGNOSIS — Z7982 Long term (current) use of aspirin: Secondary | ICD-10-CM

## 2014-10-06 DIAGNOSIS — M898X9 Other specified disorders of bone, unspecified site: Secondary | ICD-10-CM | POA: Diagnosis present

## 2014-10-06 DIAGNOSIS — E875 Hyperkalemia: Secondary | ICD-10-CM

## 2014-10-06 DIAGNOSIS — N949 Unspecified condition associated with female genital organs and menstrual cycle: Secondary | ICD-10-CM | POA: Diagnosis not present

## 2014-10-06 DIAGNOSIS — N189 Chronic kidney disease, unspecified: Secondary | ICD-10-CM | POA: Diagnosis not present

## 2014-10-06 DIAGNOSIS — E1022 Type 1 diabetes mellitus with diabetic chronic kidney disease: Secondary | ICD-10-CM | POA: Diagnosis present

## 2014-10-06 DIAGNOSIS — R1011 Right upper quadrant pain: Secondary | ICD-10-CM

## 2014-10-06 DIAGNOSIS — N9489 Other specified conditions associated with female genital organs and menstrual cycle: Secondary | ICD-10-CM | POA: Diagnosis present

## 2014-10-06 DIAGNOSIS — E8770 Fluid overload, unspecified: Secondary | ICD-10-CM | POA: Diagnosis not present

## 2014-10-06 DIAGNOSIS — D649 Anemia, unspecified: Secondary | ICD-10-CM | POA: Diagnosis present

## 2014-10-06 DIAGNOSIS — K8010 Calculus of gallbladder with chronic cholecystitis without obstruction: Secondary | ICD-10-CM | POA: Diagnosis not present

## 2014-10-06 DIAGNOSIS — Z79899 Other long term (current) drug therapy: Secondary | ICD-10-CM

## 2014-10-06 DIAGNOSIS — K8061 Calculus of gallbladder and bile duct with cholecystitis, unspecified, with obstruction: Secondary | ICD-10-CM

## 2014-10-06 DIAGNOSIS — Z7902 Long term (current) use of antithrombotics/antiplatelets: Secondary | ICD-10-CM

## 2014-10-06 DIAGNOSIS — R110 Nausea: Secondary | ICD-10-CM | POA: Diagnosis not present

## 2014-10-06 DIAGNOSIS — I120 Hypertensive chronic kidney disease with stage 5 chronic kidney disease or end stage renal disease: Secondary | ICD-10-CM | POA: Diagnosis present

## 2014-10-06 DIAGNOSIS — E1122 Type 2 diabetes mellitus with diabetic chronic kidney disease: Secondary | ICD-10-CM | POA: Diagnosis not present

## 2014-10-06 DIAGNOSIS — K8020 Calculus of gallbladder without cholecystitis without obstruction: Principal | ICD-10-CM | POA: Diagnosis present

## 2014-10-06 DIAGNOSIS — R1012 Left upper quadrant pain: Secondary | ICD-10-CM

## 2014-10-06 DIAGNOSIS — D72829 Elevated white blood cell count, unspecified: Secondary | ICD-10-CM | POA: Diagnosis not present

## 2014-10-06 DIAGNOSIS — R109 Unspecified abdominal pain: Secondary | ICD-10-CM | POA: Diagnosis not present

## 2014-10-06 DIAGNOSIS — N2889 Other specified disorders of kidney and ureter: Secondary | ICD-10-CM | POA: Diagnosis not present

## 2014-10-06 DIAGNOSIS — E1029 Type 1 diabetes mellitus with other diabetic kidney complication: Secondary | ICD-10-CM | POA: Diagnosis not present

## 2014-10-06 DIAGNOSIS — R1084 Generalized abdominal pain: Secondary | ICD-10-CM | POA: Diagnosis not present

## 2014-10-06 DIAGNOSIS — R1013 Epigastric pain: Secondary | ICD-10-CM

## 2014-10-06 DIAGNOSIS — R142 Eructation: Secondary | ICD-10-CM

## 2014-10-06 DIAGNOSIS — E119 Type 2 diabetes mellitus without complications: Secondary | ICD-10-CM

## 2014-10-06 DIAGNOSIS — Z87440 Personal history of urinary (tract) infections: Secondary | ICD-10-CM

## 2014-10-06 LAB — COMPREHENSIVE METABOLIC PANEL
ALBUMIN: 3.1 g/dL — AB (ref 3.5–5.0)
ALT: 14 U/L (ref 14–54)
AST: 16 U/L (ref 15–41)
Alkaline Phosphatase: 70 U/L (ref 38–126)
Anion gap: 12 (ref 5–15)
BILIRUBIN TOTAL: 0.4 mg/dL (ref 0.3–1.2)
BUN: 46 mg/dL — ABNORMAL HIGH (ref 6–20)
CHLORIDE: 96 mmol/L — AB (ref 101–111)
CO2: 26 mmol/L (ref 22–32)
Calcium: 8.3 mg/dL — ABNORMAL LOW (ref 8.9–10.3)
Creatinine, Ser: 8.73 mg/dL — ABNORMAL HIGH (ref 0.44–1.00)
GFR calc Af Amer: 6 mL/min — ABNORMAL LOW (ref >60)
GFR calc non Af Amer: 5 mL/min — ABNORMAL LOW (ref >60)
Glucose, Bld: 379 mg/dL — ABNORMAL HIGH (ref 65–99)
Potassium: 5.3 mmol/L — ABNORMAL HIGH (ref 3.5–5.1)
Sodium: 134 mmol/L — ABNORMAL LOW (ref 135–145)
Total Protein: 6.8 g/dL (ref 6.5–8.1)

## 2014-10-06 LAB — POCT CBC
Granulocyte percent: 73 %G (ref 37–80)
HCT, POC: 38.7 % (ref 37.7–47.9)
Hemoglobin: 11.8 g/dL — AB (ref 12.2–16.2)
LYMPH, POC: 2.6 (ref 0.6–3.4)
MCH: 27.6 pg (ref 27–31.2)
MCHC: 30.5 g/dL — AB (ref 31.8–35.4)
MCV: 90.6 fL (ref 80–97)
MID (cbc): 1.3 — AB (ref 0–0.9)
MPV: 7.4 fL (ref 0–99.8)
PLATELET COUNT, POC: 396 10*3/uL (ref 142–424)
POC Granulocyte: 10.4 — AB (ref 2–6.9)
POC LYMPH PERCENT: 18.1 %L (ref 10–50)
POC MID %: 8.9 % (ref 0–12)
RBC: 4.27 M/uL (ref 4.04–5.48)
RDW, POC: 15.9 %
WBC: 14.2 10*3/uL — AB (ref 4.6–10.2)

## 2014-10-06 LAB — POCT URINALYSIS DIPSTICK
Bilirubin, UA: NEGATIVE
GLUCOSE UA: 500
KETONES UA: NEGATIVE
Leukocytes, UA: NEGATIVE
Nitrite, UA: NEGATIVE
Protein, UA: 100
SPEC GRAV UA: 1.015
Urobilinogen, UA: 0.2
pH, UA: 8

## 2014-10-06 LAB — POCT UA - MICROSCOPIC ONLY
Casts, Ur, LPF, POC: NEGATIVE
Crystals, Ur, HPF, POC: NEGATIVE
MUCUS UA: NEGATIVE
Yeast, UA: NEGATIVE

## 2014-10-06 LAB — CBC
HCT: 36.1 % (ref 36.0–46.0)
Hemoglobin: 11.9 g/dL — ABNORMAL LOW (ref 12.0–15.0)
MCH: 30.3 pg (ref 26.0–34.0)
MCHC: 33 g/dL (ref 30.0–36.0)
MCV: 91.9 fL (ref 78.0–100.0)
Platelets: 345 10*3/uL (ref 150–400)
RBC: 3.93 MIL/uL (ref 3.87–5.11)
RDW: 14.1 % (ref 11.5–15.5)
WBC: 11.4 10*3/uL — ABNORMAL HIGH (ref 4.0–10.5)

## 2014-10-06 LAB — HEPATIC FUNCTION PANEL
ALT: 11 U/L (ref 6–29)
AST: 11 U/L (ref 10–30)
Albumin: 3.9 g/dL (ref 3.6–5.1)
Alkaline Phosphatase: 73 U/L (ref 33–115)
BILIRUBIN INDIRECT: 0.4 mg/dL (ref 0.2–1.2)
Bilirubin, Direct: 0.1 mg/dL (ref <=0.2)
Total Bilirubin: 0.5 mg/dL (ref 0.2–1.2)
Total Protein: 7.3 g/dL (ref 6.1–8.1)

## 2014-10-06 LAB — LIPASE, BLOOD: Lipase: 40 U/L (ref 22–51)

## 2014-10-06 LAB — LIPASE: Lipase: 41 U/L (ref 7–60)

## 2014-10-06 LAB — GLUCOSE, POCT (MANUAL RESULT ENTRY): POC GLUCOSE: 236 mg/dL — AB (ref 70–99)

## 2014-10-06 MED ORDER — GI COCKTAIL ~~LOC~~
30.0000 mL | Freq: Once | ORAL | Status: AC
  Administered 2014-10-06: 30 mL via ORAL
  Filled 2014-10-06: qty 30

## 2014-10-06 MED ORDER — PANTOPRAZOLE SODIUM 40 MG PO TBEC
40.0000 mg | DELAYED_RELEASE_TABLET | Freq: Once | ORAL | Status: AC
  Administered 2014-10-06: 40 mg via ORAL
  Filled 2014-10-06: qty 1

## 2014-10-06 MED ORDER — FAMOTIDINE 20 MG PO TABS
20.0000 mg | ORAL_TABLET | Freq: Once | ORAL | Status: AC
  Administered 2014-10-06: 20 mg via ORAL
  Filled 2014-10-06: qty 1

## 2014-10-06 NOTE — Progress Notes (Signed)
Called ED RN for report. Room ready for admission.

## 2014-10-06 NOTE — ED Provider Notes (Signed)
CSN: [id number]     Arrival date & time 10/06/14  1452 History   First MD Initiated Contact with Patient 10/06/14 1802     Chief Complaint  Patient presents with  . Abdominal Pain     (Consider location/radiation/quality/duration/timing/severity/associated sxs/prior Treatment) HPI Comments: Pt is a 44 yo female who presents to the ED from [facility] complaining of indigestion, onset Saturday. Pt reports that she started taking Prednisone on Saturday (due to her allergy to contrast and having a procedure done on Monday to place a dialysis port) and notes that she began to have indigestion and abdominal pain after starting the medication. She states the pain is dull, constant, worsens with laying supine and worsens an hour after eating. Endorses nausea. Denies fever, headache, CP, SOB, vomiting, diarrhea, constipation, urinary sxs. Pt reports that she went to [facility] today where she had labs done (elevated WBC) and was then told to come to the ED. She notes that she has taken Tums without relief. She reports that she tried to drink a coke which caused her to belch and reliefed the pain for only a few hours. She notes that she also is having a procedure done on Friday for her kidney.    Past Medical History  Diagnosis Date  . Diabetes mellitus without complication   . Renal disorder   . Hypertension   . Pneumonia    Past Surgical History  Procedure Laterality Date  . Cesarean section    . Eye surgery      retinal detachment   Family History  Problem Relation Age of Onset  . Diabetes Maternal Grandmother    Social History  Substance Use Topics  . Smoking status: Never Smoker   . Smokeless tobacco: Never Used  . Alcohol Use: No   OB History    No data available     Review of Systems  Gastrointestinal: Positive for nausea and abdominal pain.  All other systems reviewed and are negative.     Allergies  Iohexol and Latex  Home Medications   Prior to Admission medications    Medication Sig Start Date End Date Taking? Authorizing Provider  albuterol (PROVENTIL HFA;VENTOLIN HFA) 108 (90 BASE) MCG/ACT inhaler Inhale 1-2 puffs into the lungs every 6 (six) hours as needed for wheezing or shortness of breath.    Historical Provider, MD  aspirin 81 MG tablet Take 81 mg by mouth daily.    Historical Provider, MD  B Complex-C-Folic Acid (DIALYVITE TABLET) TABS Take 1 tablet by mouth daily at 12 noon.    Historical Provider, MD  calcium acetate (PHOSLO) 667 MG capsule Take 667 mg by mouth 3 (three) times daily with meals.     Historical Provider, MD  cinacalcet (SENSIPAR) 60 MG tablet Take 60 mg by mouth daily.    Historical Provider, MD  ciprofloxacin (CIPRO) 250 MG tablet Take 1 tablet (250 mg total) by mouth daily with breakfast. Patient not taking: Reported on 10/06/2014 09/24/14   Caren Griffins, MD  clopidogrel (PLAVIX) 75 MG tablet Take 75 mg by mouth daily.    Historical Provider, MD  glucose blood (ONE TOUCH ULTRA TEST) test strip 1 each by Other route 2 (two) times daily. And lancets 2/day 04/05/14   Renato Shin, MD  insulin lispro (HUMALOG KWIKPEN) 100 UNIT/ML KiwkPen Inject 0.1 mLs (10 Units total) into the skin 3 (three) times daily with meals. And pen needles 3/day 04/05/14   Renato Shin, MD  Insulin Pen Needle (DROPLET PEN NEEDLES) 31G  X 5 MM MISC Use to inject insulin 3 times per day 09/10/14   Renato Shin, MD  levonorgestrel [facility]) 20 MCG/24HR IUD 1 each by Intrauterine route once.    Historical Provider, MD  lidocaine-prilocaine (EMLA) cream Apply 1 application topically daily as needed (dialysis).     Historical Provider, MD  pioglitazone (ACTOS) 15 MG tablet Take 15 mg by mouth daily.    Historical Provider, MD  sevelamer carbonate (RENVELA) 800 MG tablet Take 4,000-4,800 mg by mouth 3 (three) times daily with meals.     Historical Provider, MD   BP 152/57 mmHg  Pulse 95  Temp(Src) 98.2 F (36.8 C)  Resp 18  SpO2 100% Physical Exam  Constitutional:  She is oriented to person, place, and time. She appears well-developed and well-nourished.  HENT:  Head: Normocephalic and atraumatic.  Eyes: Conjunctivae and EOM are normal. Right eye exhibits no discharge. Left eye exhibits no discharge. No scleral icterus.  Cardiovascular: Normal rate, regular rhythm, normal heart sounds and intact distal pulses.   Pulmonary/Chest: Effort normal and breath sounds normal.  Abdominal: Soft. Bowel sounds are normal. She exhibits no mass. There is tenderness. There is guarding. There is no rebound.  Diffuse mild tenderness with moderate tenderness at epigastric and RUQ region. Mild guarding present.  Musculoskeletal: She exhibits no edema.  Lymphadenopathy:    She has no cervical adenopathy.  Neurological: She is alert and oriented to person, place, and time.  Skin: Skin is warm and dry.    ED Course  Procedures (including critical care time) Labs Review Labs Reviewed - No data to display  Imaging Review Dg Abd Acute W/chest  10/06/2014   CLINICAL DATA:  Indigestion for last 4 hours, nausea without vomiting, epigastric pain, belching, heartburn, end-stage renal disease post dialysis graft placement 2 days ago, diabetes mellitus, hypertension  EXAM: DG ABDOMEN ACUTE W/ 1V CHEST  COMPARISON:  09/22/2014  FINDINGS: Normal heart size, mediastinal contours, and pulmonary vascularity.  Lungs clear.  No pleural effusion or pneumothorax.  Vascular grafts in the LEFT upper arm and at the LEFT axillary region.  Nonobstructive bowel gas pattern.  IUD projects over pelvis.  No bowel dilatation or bowel wall thickening, or free intraperitoneal air.  Small LEFT pelvic phleboliths stable.  No urinary tract calcification or acute osseous findings.  IMPRESSION: No acute abnormalities.   Electronically Signed   By: Lavonia Dana M.D.   On: 10/06/2014 15:01      MDM   Final diagnoses:  Right upper quadrant pain  Hyperkalemia    Pt presents from [facility] with indigestion. Labs  ordered. Abdominal US ordered to assess gallbladder.   Labs reviewed, WBC elevated (11.4), Hyperkalemia (5.3). Abdominal US shows multiple gallstones with no gallbladder wall thickening or pericholecystic fluid.   Hospitalist consulted, will plan to admit. Also spoke with surgeon who will plan to schedule a HIDA scan.   Discussed plan for admission with pt, pt agrees.   Meds given in ED:  Medications  famotidine (PEPCID) tablet 20 mg (20 mg Oral Given 10/06/14 2059)  pantoprazole (PROTONIX) EC tablet 40 mg (40 mg Oral Given 10/06/14 2059)  gi cocktail (Maalox,Lidocaine,Donnatal) (30 mLs Oral Given 10/06/14 2059)    New Prescriptions   No medications on file    Nona Dell, [place] 10/06/14 [place], MD 10/07/14 [phone]

## 2014-10-06 NOTE — ED Notes (Signed)
Clarified with Dr. Hulen Skains, patient is to have nuclear med study tomorrow morning. Called radiology and spoke to [place] to update on plan of care.
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
## 2014-10-06 NOTE — ED Notes (Signed)
Morgan [name], [place], as patient is requesting food to eat. She acknowledges, patient is allowed clear liquids at this time. Informed patient.
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
## 2014-10-06 NOTE — ED Notes (Signed)
Phlebotomy at the bedside  

## 2014-10-06 NOTE — ED Notes (Signed)
Called xray in regards to nuclear med study.

## 2014-10-06 NOTE — ED Notes (Signed)
Pt here for abd pain, burning and loss of appetite. Sent here by [facility] r/o gallbladder issues. sts her WBC count was elevated at [facility].

## 2014-10-06 NOTE — Patient Instructions (Signed)
Your infection fighting cells or white blood cells were elevated in the office today and with your abdominal pain  Recommend you have further evaluation to the emergency room with possible repeat CT T scan to look at gallbladder. I did discuss this plan with your nephrologist, Dr. Lorrene Reid. Your urine test today showed a few infection fighting cells, but not enough to indicate an obvious infection. We will check a urine culture,  but again this is less likely an infection in the urine causing your elevated blood count or abdominal pain.

## 2014-10-06 NOTE — ED Provider Notes (Signed)
The patient is a 44 year old female who is end-stage renal disease on dialysis. She has recently had an admission to the hospital for severe hyperkalemia requiring emergent dialysis, she has also recently had trouble with access for dialysis and has been undergoing multiple procedures requiring IV dye. She has an allergy to dye and thus has been on prednisone to help with preventing allergic reactions. She took her last dose on Saturday. This correlates with the onset of pain in her upper abdomen which is in the epigastrium and right upper quadrant, seems to be somewhat worse after eating a meal but has been rather persistent throughout the last several days. She denies nausea vomiting or diarrhea  On exam the patient has a soft obese abdomen, she has tenderness in the epigastrium and the right upper quadrant with mild guarding, no peritoneal signs, no pain in the lower abdomen, clear heart and lung sounds. The patient does not appear to be in distress.  Due to the prednisone treatment she may have a stomach ulcer, this could also be related to biliary colic or cholelithiasis, cholecystitis. Labs pending, ultrasound pending, the patient is in agreement with the plan.  Care was discussed with Dr. Hulen Skains of the Gen. surgery's service as well as with Dr. Shanon Brow of the hospitalist service, the latter of who will admit.  Medical screening examination/treatment/procedure(s) were conducted as a shared visit with non-physician practitioner(s) and myself.  I personally evaluated the patient during the encounter.  Clinical Impression:   Final diagnoses:  Right upper quadrant pain  Hyperkalemia         Noemi Chapel, MD 10/07/14 1549

## 2014-10-06 NOTE — ED Notes (Signed)
Explained npo until testing has been completed. Patient and family acknowledge.

## 2014-10-06 NOTE — Consult Note (Signed)
Reason for Consult:Gallstones and abdominal pain Referring Physician: [name] is an 44 y.o. female.  HPI: Abdominal pain with nausea and vomiting.  US demonstrates stones without GB wall thickening, no pericholecystic fluid.  LFTs normal  Past Medical History  Diagnosis Date  . Diabetes mellitus without complication   . Renal disorder   . Hypertension   . Pneumonia     Past Surgical History  Procedure Laterality Date  . Cesarean section    . Eye surgery      retinal detachment    Family History  Problem Relation Age of Onset  . Diabetes Maternal Grandmother     Social History:  reports that she has never smoked. She has never used smokeless tobacco. She reports that she does not drink alcohol or use illicit drugs.  Allergies:  Allergies  Allergen Reactions  . Iohexol Hives, Itching and Swelling     Code: HIVES, Desc: ITCHING, HIVES, SWELLING- 13 HR PRE-MEDS REQUIRED- ASM, Onset Date: [id number]   . Latex Other (See Comments)    unknown    Medications: I have reviewed the patient's current medications.  Results for orders placed or performed during the hospital encounter of 10/06/14 (from the past 48 hour(s))  CBC     Status: Abnormal   Collection Time: 10/06/14  9:07 PM  Result Value Ref Range   WBC 11.4 (H) 4.0 - 10.5 K/uL   RBC 3.93 3.87 - 5.11 MIL/uL   Hemoglobin 11.9 (L) 12.0 - 15.0 g/dL   HCT 36.1 36.0 - 46.0 %   MCV 91.9 78.0 - 100.0 fL   MCH 30.3 26.0 - 34.0 pg   MCHC 33.0 30.0 - 36.0 g/dL   RDW 14.1 11.5 - 15.5 %   Platelets 345 150 - 400 K/uL    US Abdomen Complete  10/06/2014   CLINICAL DATA:  Upper abdominal pain  EXAM: ULTRASOUND ABDOMEN COMPLETE  COMPARISON:  CT abdomen and pelvis September 23, 2014  FINDINGS: Gallbladder: Within the gallbladder, there are multiple echogenic foci which move and shadow consistent with gallstones. Largest individual gallstone measures 1.4 cm in length. There is no gallbladder wall thickening  or pericholecystic fluid. Patient is focally tender over the gallbladder.  Common bile duct: Diameter: 4 mm. There is no intrahepatic, common hepatic, or common bile duct dilatation.  Liver: No focal lesion identified. Within normal limits in parenchymal echogenicity.  IVC: No abnormality visualized.  Pancreas: Visualized portion unremarkable. Much of the pancreas is obscured by gas.  Spleen: Size and appearance within normal limits.  Right Kidney: Length: 8.9 cm. Echogenicity is increased. There is renal cortical thinning. No mass or hydronephrosis visualized.  Left Kidney: Length: 7.6 cm. Echogenicity is increased. There is borderline renal cortical thinning. No mass or hydronephrosis visualized.  Abdominal aorta: No aneurysm visualized.  Other findings: No demonstrable ascites.  IMPRESSION: Cholelithiasis with focal tenderness over the gallbladder. These are findings concerning for early cholecystitis.  Small kidneys with increased echogenicity and renal cortical thinning. These findings are concerning for medical renal disease.  Most of pancreas obscured by gas. Visualized portions of pancreas appear normal.  Study otherwise unremarkable.   Electronically Signed   By: Morgan [name] M.D.   On: 10/06/2014 21:05   Dg Abd Acute W/chest  10/06/2014   CLINICAL DATA:  Indigestion for last 4 hours, nausea without vomiting, epigastric pain, belching, heartburn, end-stage renal disease post dialysis graft placement 2 days ago, diabetes mellitus, hypertension  EXAM: DG ABDOMEN  ACUTE W/ 1V CHEST  COMPARISON:  09/22/2014  FINDINGS: Normal heart size, mediastinal contours, and pulmonary vascularity.  Lungs clear.  No pleural effusion or pneumothorax.  Vascular grafts in the LEFT upper arm and at the LEFT axillary region.  Nonobstructive bowel gas pattern.  IUD projects over pelvis.  No bowel dilatation or bowel wall thickening, or free intraperitoneal air.  Small LEFT pelvic phleboliths stable.  No urinary tract  calcification or acute osseous findings.  IMPRESSION: No acute abnormalities.   Electronically Signed   By: Morgan [name] M.D.   On: 10/06/2014 15:01    Review of Systems  Gastrointestinal: Positive for nausea, vomiting and abdominal pain.  All other systems reviewed and are negative.  Blood pressure 166/73, pulse 96, temperature 98.2 F (36.8 C), resp. rate 20, SpO2 100 %. Physical Exam  Vitals reviewed. Constitutional: She is oriented to person, place, and time. She appears well-developed and well-nourished.  HENT:  Head: Normocephalic and atraumatic.  Right Ear: External ear normal.  Left Ear: External ear normal.  Eyes: Conjunctivae and EOM are normal. Pupils are equal, round, and reactive to light.  Neck: Normal range of motion. Neck supple.  Cardiovascular: Normal rate, regular rhythm, normal heart sounds and intact distal pulses.   Respiratory: Effort normal and breath sounds normal.  GI: Soft. Normal appearance and bowel sounds are normal. There is tenderness in the right upper quadrant, right lower quadrant and epigastric area. There is positive Murphy's sign. There is no rigidity, no rebound and no guarding.    Musculoskeletal: Normal range of motion.  Neurological: She is alert and oriented to person, place, and time. She has normal reflexes.  Skin: Skin is warm and dry.  Psychiatric: She has a normal mood and affect. Her behavior is normal. Judgment and thought content normal.    Assessment/Plan: Likely acute biliary colic and perhaps coleocystitis, but not seen on [place], and no sonographic Murphy's sign, but does have Murphy's sign on my examination.  LFTs normal.  With this slight ambiguity, I believe that the patient requires a HIDA scan in the AM.  NPO now.  She will need cholecystectomy likely before being discharges.  Patient was scheduled for procedure by Morgan [name] on this Friday.  May need to call him to see if this can be postponed, or if it could be combined with  cholecystectomy on Friday.  Morgan [name] 10/06/2014, 10:05 PM

## 2014-10-07 ENCOUNTER — Encounter (HOSPITAL_COMMUNITY): Payer: Self-pay | Admitting: *Deleted

## 2014-10-07 ENCOUNTER — Inpatient Hospital Stay (HOSPITAL_COMMUNITY): Payer: Medicare Other

## 2014-10-07 DIAGNOSIS — N186 End stage renal disease: Secondary | ICD-10-CM

## 2014-10-07 DIAGNOSIS — E1029 Type 1 diabetes mellitus with other diabetic kidney complication: Secondary | ICD-10-CM

## 2014-10-07 DIAGNOSIS — R1011 Right upper quadrant pain: Secondary | ICD-10-CM

## 2014-10-07 DIAGNOSIS — Z992 Dependence on renal dialysis: Secondary | ICD-10-CM

## 2014-10-07 LAB — COMPREHENSIVE METABOLIC PANEL
ALK PHOS: 52 U/L (ref 38–126)
ALT: 10 U/L — AB (ref 14–54)
ANION GAP: 15 (ref 5–15)
AST: 12 U/L — ABNORMAL LOW (ref 15–41)
Albumin: 2.5 g/dL — ABNORMAL LOW (ref 3.5–5.0)
BUN: 49 mg/dL — AB (ref 6–20)
CHLORIDE: 98 mmol/L — AB (ref 101–111)
CO2: 23 mmol/L (ref 22–32)
Calcium: 8 mg/dL — ABNORMAL LOW (ref 8.9–10.3)
Creatinine, Ser: 9.21 mg/dL — ABNORMAL HIGH (ref 0.44–1.00)
GFR calc non Af Amer: 5 mL/min — ABNORMAL LOW (ref >60)
GFR, EST AFRICAN AMERICAN: 5 mL/min — AB (ref >60)
GLUCOSE: 142 mg/dL — AB (ref 65–99)
Potassium: 4.3 mmol/L (ref 3.5–5.1)
Sodium: 136 mmol/L (ref 135–145)
TOTAL PROTEIN: 5.7 g/dL — AB (ref 6.5–8.1)
Total Bilirubin: 0.5 mg/dL (ref 0.3–1.2)

## 2014-10-07 LAB — CBC
HCT: 31.7 % — ABNORMAL LOW (ref 36.0–46.0)
HEMOGLOBIN: 10.5 g/dL — AB (ref 12.0–15.0)
MCH: 30.3 pg (ref 26.0–34.0)
MCHC: 33.1 g/dL (ref 30.0–36.0)
MCV: 91.6 fL (ref 78.0–100.0)
Platelets: 318 10*3/uL (ref 150–400)
RBC: 3.46 MIL/uL — ABNORMAL LOW (ref 3.87–5.11)
RDW: 14.3 % (ref 11.5–15.5)
WBC: 8.6 10*3/uL (ref 4.0–10.5)

## 2014-10-07 LAB — GLUCOSE, CAPILLARY
GLUCOSE-CAPILLARY: 262 mg/dL — AB (ref 65–99)
GLUCOSE-CAPILLARY: 276 mg/dL — AB (ref 65–99)
Glucose-Capillary: 119 mg/dL — ABNORMAL HIGH (ref 65–99)
Glucose-Capillary: 289 mg/dL — ABNORMAL HIGH (ref 65–99)

## 2014-10-07 MED ORDER — ONDANSETRON HCL 4 MG/2ML IJ SOLN: 4.0000 mg | Freq: Three times a day (TID) | INTRAMUSCULAR | Status: DC | PRN

## 2014-10-07 MED ORDER — CEFTRIAXONE SODIUM 2 G IJ SOLR
2.0000 g | INTRAMUSCULAR | Status: DC
  Administered 2014-10-07: 2 g via INTRAVENOUS
  Filled 2014-10-07: qty 2

## 2014-10-07 MED ORDER — CALCIUM ACETATE (PHOS BINDER) 667 MG PO CAPS
1334.0000 mg | ORAL_CAPSULE | Freq: Three times a day (TID) | ORAL | Status: DC
  Administered 2014-10-07: 667 mg via ORAL
  Filled 2014-10-07: qty 2

## 2014-10-07 MED ORDER — HYDROMORPHONE HCL 1 MG/ML IJ SOLN: 0.5000 mg | Freq: Four times a day (QID) | INTRAMUSCULAR | Status: DC | PRN

## 2014-10-07 MED ORDER — TECHNETIUM TC 99M MEBROFENIN IV KIT
5.0000 | PACK | Freq: Once | INTRAVENOUS | Status: DC | PRN
  Administered 2014-10-07: 5 via INTRAVENOUS
  Filled 2014-10-07: qty 6

## 2014-10-07 MED ORDER — INSULIN ASPART 100 UNIT/ML ~~LOC~~ SOLN
3.0000 [IU] | Freq: Once | SUBCUTANEOUS | Status: AC
  Administered 2014-10-07: 3 [IU] via SUBCUTANEOUS

## 2014-10-07 MED ORDER — INSULIN ASPART 100 UNIT/ML ~~LOC~~ SOLN
0.0000 [IU] | Freq: Three times a day (TID) | SUBCUTANEOUS | Status: DC
  Administered 2014-10-09 (×2): 3 [IU] via SUBCUTANEOUS

## 2014-10-07 MED ORDER — SINCALIDE 5 MCG IJ SOLR
INTRAMUSCULAR | Status: AC
  Administered 2014-10-07: 1.57 ug
  Filled 2014-10-07: qty 5

## 2014-10-07 MED ORDER — DOXERCALCIFEROL 4 MCG/2ML IV SOLN
4.0000 ug | INTRAVENOUS | Status: DC
  Administered 2014-10-08 – 2014-10-09 (×2): 4 ug via INTRAVENOUS
  Filled 2014-10-07 (×2): qty 2

## 2014-10-07 MED ORDER — PANTOPRAZOLE SODIUM 40 MG PO TBEC
40.0000 mg | DELAYED_RELEASE_TABLET | Freq: Two times a day (BID) | ORAL | Status: DC
  Administered 2014-10-07 – 2014-10-09 (×4): 40 mg via ORAL
  Filled 2014-10-07 (×4): qty 1

## 2014-10-07 MED ORDER — CINACALCET HCL 30 MG PO TABS
60.0000 mg | ORAL_TABLET | Freq: Every day | ORAL | Status: DC
  Administered 2014-10-07: 60 mg via ORAL
  Filled 2014-10-07 (×2): qty 2

## 2014-10-07 MED ORDER — CALCIUM ACETATE (PHOS BINDER) 667 MG PO CAPS
667.0000 mg | ORAL_CAPSULE | Freq: Three times a day (TID) | ORAL | Status: DC
  Administered 2014-10-09: 667 mg via ORAL
  Filled 2014-10-07 (×2): qty 1

## 2014-10-07 MED ORDER — OXYCODONE-ACETAMINOPHEN 5-325 MG PO TABS: 1.0000 | ORAL_TABLET | ORAL | Status: DC | PRN

## 2014-10-07 MED ORDER — ALBUTEROL SULFATE (2.5 MG/3ML) 0.083% IN NEBU: 3.0000 mL | INHALATION_SOLUTION | Freq: Four times a day (QID) | RESPIRATORY_TRACT | Status: DC | PRN

## 2014-10-07 MED ORDER — ACETAMINOPHEN 325 MG PO TABS: 650.0000 mg | ORAL_TABLET | Freq: Four times a day (QID) | ORAL | Status: DC | PRN

## 2014-10-07 MED ORDER — INSULIN ASPART 100 UNIT/ML ~~LOC~~ SOLN
0.0000 [IU] | SUBCUTANEOUS | Status: DC
  Administered 2014-10-07: 5 [IU] via SUBCUTANEOUS

## 2014-10-07 MED ORDER — SEVELAMER CARBONATE 800 MG PO TABS
3200.0000 mg | ORAL_TABLET | Freq: Three times a day (TID) | ORAL | Status: DC
  Administered 2014-10-07: 4000 mg via ORAL
  Administered 2014-10-09: 3200 mg via ORAL
  Filled 2014-10-07: qty 5
  Filled 2014-10-07 (×2): qty 4

## 2014-10-07 MED ORDER — IBUPROFEN 400 MG PO TABS
400.0000 mg | ORAL_TABLET | Freq: Four times a day (QID) | ORAL | Status: DC | PRN
  Administered 2014-10-08 – 2014-10-09 (×3): 400 mg via ORAL
  Filled 2014-10-07 (×3): qty 1

## 2014-10-07 MED ORDER — STERILE WATER FOR INJECTION IJ SOLN
INTRAMUSCULAR | Status: AC
  Filled 2014-10-07: qty 10

## 2014-10-07 MED ORDER — SODIUM CHLORIDE 0.9 % IJ SOLN
3.0000 mL | Freq: Two times a day (BID) | INTRAMUSCULAR | Status: DC
  Administered 2014-10-07 – 2014-10-09 (×3): 3 mL via INTRAVENOUS

## 2014-10-07 MED ORDER — SODIUM CHLORIDE 0.9 % IV SOLN: 250.0000 mL | INTRAVENOUS | Status: DC | PRN

## 2014-10-07 MED ORDER — PIPERACILLIN-TAZOBACTAM IN DEX 2-0.25 GM/50ML IV SOLN
2.2500 g | Freq: Three times a day (TID) | INTRAVENOUS | Status: DC
  Administered 2014-10-07: 2.25 g via INTRAVENOUS
  Filled 2014-10-07 (×2): qty 50

## 2014-10-07 MED ORDER — SODIUM CHLORIDE 0.9 % IJ SOLN: 3.0000 mL | INTRAMUSCULAR | Status: DC | PRN

## 2014-10-07 NOTE — Progress Notes (Signed)
[facility] Progress Note     Subjective: Pt still hurting in RUQ/epigastrium.  No N/V, ambulating well.  Awaiting HIDA.  NPO.  Concerned about proceeding with her urology procedure (cystoscopy? To check to see why her right kidney is blocked).  She also has a pending appointment with [facility] on Monday that she's worried about missing to re-do her HD catheter.  Objective: Vital signs in last 24 hours: Temp:  [98.2 F (36.8 C)-98.7 F (37.1 C)] 98.4 F (36.9 C) (08/11 0356) Pulse Rate:  [84-98] 84 (08/11 0356) Resp:  [16-20] 18 (08/11 0356) BP: (128-166)/(57-81) 137/61 mmHg (08/11 0356) SpO2:  [93 %-100 %] 98 % (08/11 0356) Weight:  [76.204 kg (168 lb)-78.6 kg (173 lb 4.5 oz)] 78.6 kg (173 lb 4.5 oz) (08/11 0030) Last BM Date: 10/06/14  Intake/Output from previous day:   Intake/Output this shift: Total I/O In: 50 [IV Piggyback:50] Out: -   PE: Gen:  Alert, NAD, pleasant Abd: Soft, tender in RUQ and epigastrium, ND, +BS, no HSM   Lab Results:   Recent Labs  10/06/14 2107 10/07/14 0640  WBC 11.4* 8.6  HGB 11.9* 10.5*  HCT 36.1 31.7*  PLT 345 318   BMET  Recent Labs  10/06/14 2107 10/07/14 0640  NA 134* 136  K 5.3* 4.3  CL 96* 98*  CO2 26 23  GLUCOSE 379* 142*  BUN 46* 49*  CREATININE 8.73* 9.21*  CALCIUM 8.3* 8.0*   PT/INR No results for input(s): LABPROT, INR in the last 72 hours. CMP     Component Value Date/Time   NA 136 10/07/2014 0640   K 4.3 10/07/2014 0640   CL 98* 10/07/2014 0640   CO2 23 10/07/2014 0640   GLUCOSE 142* 10/07/2014 0640   BUN 49* 10/07/2014 0640   CREATININE 9.21* 10/07/2014 0640   CALCIUM 8.0* 10/07/2014 0640   PROT 5.7* 10/07/2014 0640   ALBUMIN 2.5* 10/07/2014 0640   AST 12* 10/07/2014 0640   ALT 10* 10/07/2014 0640   ALKPHOS 52 10/07/2014 0640   BILITOT 0.5 10/07/2014 0640   GFRNONAA 5* 10/07/2014 0640   GFRAA 5* 10/07/2014 0640   Lipase     Component Value Date/Time   LIPASE 40  10/06/2014 2107       Studies/Results: US Abdomen Complete  10/06/2014   CLINICAL DATA:  Upper abdominal pain  EXAM: ULTRASOUND ABDOMEN COMPLETE  COMPARISON:  CT abdomen and pelvis September 23, 2014  FINDINGS: Gallbladder: Within the gallbladder, there are multiple echogenic foci which move and shadow consistent with gallstones. Largest individual gallstone measures 1.4 cm in length. There is no gallbladder wall thickening or pericholecystic fluid. Patient is focally tender over the gallbladder.  Common bile duct: Diameter: 4 mm. There is no intrahepatic, common hepatic, or common bile duct dilatation.  Liver: No focal lesion identified. Within normal limits in parenchymal echogenicity.  IVC: No abnormality visualized.  Pancreas: Visualized portion unremarkable. Much of the pancreas is obscured by gas.  Spleen: Size and appearance within normal limits.  Right Kidney: Length: 8.9 cm. Echogenicity is increased. There is renal cortical thinning. No mass or hydronephrosis visualized.  Left Kidney: Length: 7.6 cm. Echogenicity is increased. There is borderline renal cortical thinning. No mass or hydronephrosis visualized.  Abdominal aorta: No aneurysm visualized.  Other findings: No demonstrable ascites.  IMPRESSION: Cholelithiasis with focal tenderness over the gallbladder. These are findings concerning for early cholecystitis.  Small kidneys with increased echogenicity and renal cortical thinning. These findings are concerning  for medical renal disease.  Most of pancreas obscured by gas. Visualized portions of pancreas appear normal.  Study otherwise unremarkable.   Electronically Signed   By: Lowella Grip III M.D.   On: 10/06/2014 21:05   Dg Abd Acute W/chest  10/06/2014   CLINICAL DATA:  Indigestion for last 4 hours, nausea without vomiting, epigastric pain, belching, heartburn, end-stage renal disease post dialysis graft placement 2 days ago, diabetes mellitus, hypertension  EXAM: DG ABDOMEN ACUTE W/ 1V  CHEST  COMPARISON:  09/22/2014  FINDINGS: Normal heart size, mediastinal contours, and pulmonary vascularity.  Lungs clear.  No pleural effusion or pneumothorax.  Vascular grafts in the LEFT upper arm and at the LEFT axillary region.  Nonobstructive bowel gas pattern.  IUD projects over pelvis.  No bowel dilatation or bowel wall thickening, or free intraperitoneal air.  Small LEFT pelvic phleboliths stable.  No urinary tract calcification or acute osseous findings.  IMPRESSION: No acute abnormalities.   Electronically Signed   By: Lavonia Dana M.D.   On: 10/06/2014 15:01    Anti-infectives: Anti-infectives    Start     Dose/Rate Route Frequency Ordered Stop   10/07/14 0800  piperacillin-tazobactam (ZOSYN) IVPB 2.25 g     2.25 g 100 mL/hr over 30 Minutes Intravenous Every 8 hours 10/07/14 0741         Assessment/Plan Biliary colic, possible cholecystitis -HIDA scan pending at lunch -NPO, IVF, pain control, antiemetics -Was pending procedure by Dr. Karsten Ro on Friday, this will need to be postponed or in combination with lap chole on Friday.  Sent him an epic message to discuss. -WBC normalized this am and LFT's normal -D/c Zosyn, change to rocephin -Concerned about proceeding with her urology procedure (cystoscopy? To check to see why her right kidney is blocked).  She also has a pending appointment with [facility] on Monday to see about re-doing her HD cath. -If HIDA negative could transfer to [facility] for her procedure tomorrow?  She'd prefer if HIDA negative to not interrupt her pending surgery and appointments and to see [place] as an OP.  ESRD on HD -Would be better to have HD tonight so that she can get her HIDA today and potentially surgery tomorrow if HIDA positive    LOS: 1 day    Nat Christen 10/07/2014, 9:37 AM Pager: [phone]

## 2014-10-07 NOTE — H&P (Signed)
PCP:   MABE,Nyala Kirchner, NP   Chief Complaint:  abd pain  HPI: 44 yo female h/o esrd s/p renal transplant failed still on dialysis due tomorrow comes in with ruq abd pain all day today no worse with eating.  Denies fever.  Pt not very cooperative with interview, she is upset that she is here still in the ED and being admitted.  She said we were "forcing her to stay".  She reports she has other surgeries scheduled for later this week and this is going to mess up her other appointments.  Her pain has resolved with treatment in the ED.  No n/v.  She still has her gallbladder.  General surgery has seen the patient and requested HIDA scan in the am for which she was referred to [place] for admission for HIDA in am.    Review of Systems:  Positive and negative as per HPI otherwise all other systems are negative  Past Medical History: Past Medical History  Diagnosis Date  . Diabetes mellitus without complication   . Renal disorder   . Hypertension   . Pneumonia    Past Surgical History  Procedure Laterality Date  . Cesarean section    . Eye surgery      retinal detachment    Medications: Prior to Admission medications   Medication Sig Start Date End Date Taking? Authorizing Provider  albuterol (PROVENTIL HFA;VENTOLIN HFA) 108 (90 BASE) MCG/ACT inhaler Inhale 1-2 puffs into the lungs every 6 (six) hours as needed for wheezing or shortness of breath.   Yes Historical Provider, MD  aspirin 81 MG tablet Take 81 mg by mouth daily.   Yes Historical Provider, MD  B Complex-C-Folic Acid (DIALYVITE TABLET) TABS Take 1 tablet by mouth daily at 12 noon.   Yes Historical Provider, MD  calcium acetate (PHOSLO) 667 MG capsule Take 667 mg by mouth 3 (three) times daily with meals.    Yes Historical Provider, MD  cinacalcet (SENSIPAR) 60 MG tablet Take 60 mg by mouth daily.   Yes Historical Provider, MD  clopidogrel (PLAVIX) 75 MG tablet Take 75 mg by mouth daily.   Yes Historical Provider, MD  insulin lispro  (HUMALOG KWIKPEN) 100 UNIT/ML KiwkPen Inject 0.1 mLs (10 Units total) into the skin 3 (three) times daily with meals. And pen needles 3/day 04/05/14  Yes Renato Shin, MD  levonorgestrel (MIRENA) 20 MCG/24HR IUD 1 each by Intrauterine route once.   Yes Historical Provider, MD  lidocaine-prilocaine (EMLA) cream Apply 1 application topically daily as needed (dialysis).    Yes Historical Provider, MD  pioglitazone (ACTOS) 15 MG tablet Take 15 mg by mouth daily.   Yes Historical Provider, MD  sevelamer carbonate (RENVELA) 800 MG tablet Take 4,000-4,800 mg by mouth 3 (three) times daily with meals.    Yes Historical Provider, MD  ciprofloxacin (CIPRO) 250 MG tablet Take 1 tablet (250 mg total) by mouth daily with breakfast. Patient not taking: Reported on 10/06/2014 09/24/14   Caren Griffins, MD  glucose blood (ONE TOUCH ULTRA TEST) test strip 1 each by Other route 2 (two) times daily. And lancets 2/day 04/05/14   Renato Shin, MD  Insulin Pen Needle (DROPLET PEN NEEDLES) 31G X 5 MM MISC Use to inject insulin 3 times per day 09/10/14   Renato Shin, MD    Allergies:   Allergies  Allergen Reactions  . Iohexol Hives, Itching and Swelling     Code: HIVES, Desc: ITCHING, HIVES, SWELLING- 13 HR PRE-MEDS REQUIRED- ASM, Onset  Date: [id number]   . Latex Other (See Comments)    unknown    Social History:  reports that she has never smoked. She has never used smokeless tobacco. She reports that she does not drink alcohol or use illicit drugs.  Family History: Family History  Problem Relation Age of Onset  . Diabetes Maternal Grandmother     Physical Exam: Filed Vitals:   10/06/14 2245 10/06/14 2314 10/06/14 2315 10/07/14 0030  BP: 160/72 141/79 128/66 147/63  Pulse: 88 92 87 85  Temp:    98.5 F (36.9 C)  TempSrc:      Resp:  20  20  Weight:    78.6 kg (173 lb 4.5 oz)  SpO2: 100% 100% 100% 100%    At first patient would not let me examine her as she report "i have already been examined multiple  times".  After explaining why i would like to examine her she gave me permission to touch her with husband in the room.  General appearance: alert, no distress and uncooperative Head: Normocephalic, without obvious abnormality, atraumatic Eyes: negative Nose: Nares normal. Septum midline. Mucosa normal. No drainage or sinus tenderness. Neck: no JVD and supple, symmetrical, trachea midline Lungs: clear to auscultation bilaterally Heart: regular rate and rhythm, S1, S2 normal, no murmur, click, rub or gallop Abdomen: soft, non-tender; bowel sounds normal; no masses,  no organomegaly Extremities: extremities normal, atraumatic, no cyanosis or edema Pulses: 2+ and symmetric Skin: Skin color, texture, turgor normal. No rashes or lesions Neurologic: Grossly normal    Labs on Admission:   Recent Labs  10/06/14 2107  NA 134*  K 5.3*  CL 96*  CO2 26  GLUCOSE 379*  BUN 46*  CREATININE 8.73*  CALCIUM 8.3*    Recent Labs  10/06/14 1254 10/06/14 2107  AST 11 16  ALT 11 14  ALKPHOS 73 70  BILITOT 0.5 0.4  PROT 7.3 6.8  ALBUMIN 3.9 3.1*    Recent Labs  10/06/14 1254 10/06/14 2107  LIPASE 41 40    Recent Labs  10/06/14 1308 10/06/14 2107  WBC 14.2* 11.4*  HGB 11.8* 11.9*  HCT 38.7 36.1  MCV 90.6 91.9  PLT  --  345   No results for input(s): CKTOTAL, CKMB, CKMBINDEX, TROPONINI in the last 72 hours. No results for input(s): TSH, T4TOTAL, T3FREE, THYROIDAB in the last 72 hours.  Invalid input(s): FREET3 No results for input(s): VITAMINB12, FOLATE, FERRITIN, TIBC, IRON, RETICCTPCT in the last 72 hours.  Radiological Exams on Admission: US Abdomen Complete  10/06/2014   CLINICAL DATA:  Upper abdominal pain  EXAM: ULTRASOUND ABDOMEN COMPLETE  COMPARISON:  CT abdomen and pelvis September 23, 2014  FINDINGS: Gallbladder: Within the gallbladder, there are multiple echogenic foci which move and shadow consistent with gallstones. Largest individual gallstone measures 1.4 cm in  length. There is no gallbladder wall thickening or pericholecystic fluid. Patient is focally tender over the gallbladder.  Common bile duct: Diameter: 4 mm. There is no intrahepatic, common hepatic, or common bile duct dilatation.  Liver: No focal lesion identified. Within normal limits in parenchymal echogenicity.  IVC: No abnormality visualized.  Pancreas: Visualized portion unremarkable. Much of the pancreas is obscured by gas.  Spleen: Size and appearance within normal limits.  Right Kidney: Length: 8.9 cm. Echogenicity is increased. There is renal cortical thinning. No mass or hydronephrosis visualized.  Left Kidney: Length: 7.6 cm. Echogenicity is increased. There is borderline renal cortical thinning. No mass or hydronephrosis visualized.  Abdominal  aorta: No aneurysm visualized.  Other findings: No demonstrable ascites.  IMPRESSION: Cholelithiasis with focal tenderness over the gallbladder. These are findings concerning for early cholecystitis.  Small kidneys with increased echogenicity and renal cortical thinning. These findings are concerning for medical renal disease.  Most of pancreas obscured by gas. Visualized portions of pancreas appear normal.  Study otherwise unremarkable.   Electronically Signed   By: Lowella Grip III M.D.   On: 10/06/2014 21:05   Dg Abd Acute W/chest  10/06/2014   CLINICAL DATA:  Indigestion for last 4 hours, nausea without vomiting, epigastric pain, belching, heartburn, end-stage renal disease post dialysis graft placement 2 days ago, diabetes mellitus, hypertension  EXAM: DG ABDOMEN ACUTE W/ 1V CHEST  COMPARISON:  09/22/2014  FINDINGS: Normal heart size, mediastinal contours, and pulmonary vascularity.  Lungs clear.  No pleural effusion or pneumothorax.  Vascular grafts in the LEFT upper arm and at the LEFT axillary region.  Nonobstructive bowel gas pattern.  IUD projects over pelvis.  No bowel dilatation or bowel wall thickening, or free intraperitoneal air.  Small LEFT  pelvic phleboliths stable.  No urinary tract calcification or acute osseous findings.  IMPRESSION: No acute abnormalities.   Electronically Signed   By: Lavonia Dana M.D.   On: 10/06/2014 15:01   Assessment/Plan  44 yo female ESRD dialysis status with ruq pain concerning   Principal Problem:   Abdominal pain-  obs for HIDA in am.  General surgery following.  Active Problems:   CKD (chronic kidney disease) stage V requiring chronic dialysis- called dialysis and left a message for dialysis in am   DM (diabetes mellitus) type I controlled with renal manifestation- cont lantus.  Ssi.   Pt seen before midnight Keri Tavella A 10/07/2014, 12:44 AM

## 2014-10-07 NOTE — Progress Notes (Signed)
Called Darrel Reach, scheduler at [facility] and left message on voicemail  that patient is in [facility] with gallbladder issues and is on the OR schedule for surgery at [facility] on Friday 10/08/2014.

## 2014-10-07 NOTE — Progress Notes (Addendum)
[facility] PROGRESS NOTE  Morgan [name] [id number] DOB: 01/15/1971 DOA: 10/06/2014 PCP: [name], NP  Brief Summary  44 yo female h/o esrd s/p renal transplant failed still on dialysis due tomorrow comes in with ruq abd pain all day today no worse with eating. Denies fever. Pt not very cooperative with interview, she is upset that she is here still in the ED and being admitted. She said we were "forcing her to stay". She reports she has other surgeries scheduled for later this week and this is going to mess up her other appointments. Her pain has resolved with treatment in the ED. No n/v. She still has her gallbladder. General surgery has seen the patient and requested HIDA scan in the am for which she was referred to [place] for admission for HIDA in am.   Assessment/Plan  Abdominal pain. Differential diagnosis included right hydronephrosis, acute cholecystitis, peptic ulcer disease, gastritis. She had an abdominal ultrasound which demonstrated cholelithiasis with tenderness over the gallbladder which was concerning for cholecystitis. Although she had had some hydronephrosis on ultrasound a few weeks ago, her kidneys appear to be small with medical renal disease. They did not have hydronephrosis during this admission. She was seen by general surgery who recommended a HIDA scan which demonstrated normal gallbladder filling (NO acute cholecystitis), however, she had no contraction with CCK infusion suggesting gallbladder dyskinesia.   -  Appreciate urology and general surgery assistance -  May still need cholecystectomy for gallbladder dyskinesia -  Continue to hold plavix and aspirin  ESRD -  Appreciate nephrology assistance  DM, type 2 with renal manifestations -  Continue low dose SSI   Diet:  Renal/diabetic Access:  PIV IVF:  off Proph:  SCDs  Code Status: Full code Family Communication: Patient alone Disposition Plan: Pending possible laparoscopic  cholecystectomy   Consultants:  Nephrology  General surgery  Urology  Procedures:  Abdominal ultrasound  HIDA scan  Antibiotics:  Ceftriaxone once on 8/11   HPI/Subjective:  States that she continues to have severe right upper quadrant pain and some chest pain.  Did not want to answer my questions today.  Objective: Filed Vitals:   10/06/14 2315 10/07/14 0030 10/07/14 0356 10/07/14 0845  BP: 128/66 147/63 137/61 124/68  Pulse: 87 85 84 77  Temp:  98.5 F (36.9 C) 98.4 F (36.9 C) 97.3 F (36.3 C)  TempSrc:    Oral  Resp:  20 18 18   Weight:  78.6 kg (173 lb 4.5 oz)    SpO2: 100% 100% 98% 98%    Intake/Output Summary (Last 24 hours) at 10/07/14 1625 Last data filed at 10/07/14 1400  Gross per 24 hour  Intake    100 ml  Output      0 ml  Net    100 ml   Filed Weights   10/07/14 0030  Weight: 78.6 kg (173 lb 4.5 oz)   Body mass index is 31.69 kg/(m^2).  Exam:   General:  Adult female, No acute distress  HEENT:  NCAT, MMM  Cardiovascular:  RRR  Respiratory:  CTAB, no increased WOB  Abdomen:   NABS, soft, tender to palpation in the right upper quadrant and epigastrium without rebound or guarding  MSK:   Normal tone and bulk, no LEE  Neuro:  Grossly intact  Data Reviewed: Basic Metabolic Panel:  Recent Labs Lab 10/06/14 2107 10/07/14 0640  NA 134* 136  K 5.3* 4.3  CL 96* 98*  CO2 26 23  GLUCOSE 379* 142*  BUN 46* 49*  CREATININE 8.73* 9.21*  CALCIUM 8.3* 8.0*   Liver Function Tests:  Recent Labs Lab 10/06/14 1254 10/06/14 2107 10/07/14 0640  AST 11 16 12*  ALT 11 14 10*  ALKPHOS 73 70 52  BILITOT 0.5 0.4 0.5  PROT 7.3 6.8 5.7*  ALBUMIN 3.9 3.1* 2.5*    Recent Labs Lab 10/06/14 1254 10/06/14 2107  LIPASE 41 40   No results for input(s): AMMONIA in the last 168 hours. CBC:  Recent Labs Lab 10/06/14 1308 10/06/14 2107 10/07/14 0640  WBC 14.2* 11.4* 8.6  HGB 11.8* 11.9* 10.5*  HCT 38.7 36.1 31.7*  MCV 90.6 91.9  91.6  PLT  --  345 318    No results found for this or any previous visit (from the past 240 hour(s)).   Studies: Nm Hepatobiliary Including Gb  10/07/2014   CLINICAL DATA:  Upper abdominal pain.  Cholelithiasis.  EXAM: NUCLEAR MEDICINE HEPATOBILIARY IMAGING WITH GALLBLADDER EF  TECHNIQUE: Sequential images of the abdomen were obtained out to 60 minutes following intravenous administration of radiopharmaceutical. After slow intravenous infusion of 1.57 micrograms Cholecystokinin, gallbladder ejection fraction was determined.  RADIOPHARMACEUTICALS:  5.0 mCi Tc-12m Choletec IV  COMPARISON:  Ultrasound dated 10/06/2014  FINDINGS: The gallbladder is visualized at 10 min. There is activity in the duodenum at 10 min. At 59 min the ejection fraction was 1.2%. At 45 min, normal ejection fraction is greater than 40%.  IMPRESSION: Abnormal hepatobiliary scan. The gallbladder visualizes normally but there is no contraction with CCK infusion. 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   Electronically Signed   By: Lorriane Shire M.D.   On: 10/07/2014 16:03   US Abdomen Complete  10/06/2014   CLINICAL DATA:  Upper abdominal pain  EXAM: ULTRASOUND ABDOMEN COMPLETE  COMPARISON:  CT abdomen and pelvis September 23, 2014  FINDINGS: Gallbladder: Within the gallbladder, there are multiple echogenic foci which move and shadow consistent with gallstones. Largest individual gallstone measures 1.4 cm in length. There is no gallbladder wall thickening or pericholecystic fluid. Patient is focally tender over the gallbladder.  Common bile duct: Diameter: 4 mm. There is no intrahepatic, common hepatic, or common bile duct dilatation.  Liver: No focal lesion identified. Within normal limits in parenchymal echogenicity.  IVC: No abnormality visualized.  Pancreas: Visualized portion unremarkable. Much of the pancreas is obscured by gas.  Spleen: Size and appearance within normal limits.  Right Kidney: Length: 8.9 cm.  Echogenicity is increased. There is renal cortical thinning. No mass or hydronephrosis visualized.  Left Kidney: Length: 7.6 cm. Echogenicity is increased. There is borderline renal cortical thinning. No mass or hydronephrosis visualized.  Abdominal aorta: No aneurysm visualized.  Other findings: No demonstrable ascites.  IMPRESSION: Cholelithiasis with focal tenderness over the gallbladder. These are findings concerning for early cholecystitis.  Small kidneys with increased echogenicity and renal cortical thinning. These findings are concerning for medical renal disease.  Most of pancreas obscured by gas. Visualized portions of pancreas appear normal.  Study otherwise unremarkable.   Electronically Signed   By: Lowella Grip III M.D.   On: 10/06/2014 21:05   Dg Abd Acute W/chest  10/06/2014   CLINICAL DATA:  Indigestion for last 4 hours, nausea without vomiting, epigastric pain, belching, heartburn, end-stage renal disease post dialysis graft placement 2 days ago, diabetes mellitus, hypertension  EXAM: DG ABDOMEN ACUTE W/ 1V CHEST  COMPARISON:  09/22/2014  FINDINGS: Normal heart size, mediastinal contours, and pulmonary vascularity.  Lungs clear.  No  pleural effusion or pneumothorax.  Vascular grafts in the LEFT upper arm and at the LEFT axillary region.  Nonobstructive bowel gas pattern.  IUD projects over pelvis.  No bowel dilatation or bowel wall thickening, or free intraperitoneal air.  Small LEFT pelvic phleboliths stable.  No urinary tract calcification or acute osseous findings.  IMPRESSION: No acute abnormalities.   Electronically Signed   By: Lavonia Dana M.D.   On: 10/06/2014 15:01    Scheduled Meds: . calcium acetate  1,334 mg Oral TID WC  . cefTRIAXone (ROCEPHIN)  IV  2 g Intravenous Q24H  . cinacalcet  60 mg Oral Q supper  . doxercalciferol  4 mcg Intravenous Q T,Th,Sa-HD  . [START ON 10/08/2014] insulin aspart  0-9 Units Subcutaneous TID WC  . sodium chloride  3 mL Intravenous Q12H  .  sterile water (preservative free)       Continuous Infusions:   Principal Problem:   Abdominal pain Active Problems:   CKD (chronic kidney disease) stage V requiring chronic dialysis   DM (diabetes mellitus) type I controlled with renal manifestation    Time spent: 30 min    Shaquitta Burbridge, [facility] Pager [phone]. If 7PM-7AM, please contact night-coverage at [email or web address], password [facility] 10/07/2014, 4:25 PM  LOS: 1 day

## 2014-10-07 NOTE — Progress Notes (Signed)
ANTIBIOTIC CONSULT NOTE - INITIAL  Pharmacy Consult for zosyn Indication: intra-abd infection  Allergies  Allergen Reactions  . Iohexol Hives, Itching and Swelling     Code: HIVES, Desc: ITCHING, HIVES, SWELLING- 13 HR PRE-MEDS REQUIRED- ASM, Onset Date: 09233007   . Latex Other (See Comments)    unknown    Patient Measurements: Weight: 173 lb 4.5 oz (78.6 kg)   Vital Signs: Temp: 98.4 F (36.9 C) (08/11 0356) Temp Source: Oral (08/10 2224) BP: 137/61 mmHg (08/11 0356) Pulse Rate: 84 (08/11 0356) Intake/Output from previous day:   Intake/Output from this shift:    Labs:  Recent Labs  10/06/14 1308 10/06/14 2107  WBC 14.2* 11.4*  HGB 11.8* 11.9*  PLT  --  345  CREATININE  --  8.73*   Estimated Creatinine Clearance: 8 mL/min (by C-G formula based on Cr of 8.73). No results for input(s): VANCOTROUGH, VANCOPEAK, VANCORANDOM, GENTTROUGH, GENTPEAK, GENTRANDOM, TOBRATROUGH, TOBRAPEAK, TOBRARND, AMIKACINPEAK, AMIKACINTROU, AMIKACIN in the last 72 hours.   Microbiology: Recent Results (from the past 720 hour(s))  Culture, Urine     Status: None   Collection Time: 09/23/14 12:30 PM  Result Value Ref Range Status   Specimen Description URINE, RANDOM  Final   Special Requests NONE  Final   Culture MULTIPLE SPECIES PRESENT, SUGGEST RECOLLECTION  Final   Report Status 09/24/2014 FINAL  Final    Medical History: Past Medical History  Diagnosis Date  . Diabetes mellitus without complication   . Renal disorder   . Hypertension   . Pneumonia    Assessment: 44 yo ESRD pt s/p failed renal transplant, on HD.  Pharmacy consulted to dose zosyn for intra-abd infection.   Plan:  -zosyn 2.25 gm IV q8h -pharmacy to sign off Thanks  Eudelia Bunch, Pharm.D. [phone] 10/07/2014 7:42 AM

## 2014-10-07 NOTE — Consult Note (Signed)
Indication for Consultation:  Management of ESRD/hemodialysis; anemia, hypertension/volume and secondary hyperparathyroidism  HPI: Morgan [name] is a 44 y.o. female who was sent to the ED last night from urgent care for evaluation of abd pain and WBC 14.2. She recieves HD [facility], history of HTN, DM. Pt is not cooperative with exam. She was recently admitted 7/27-/29 for abd pain, she was also found to have R hydronephrosis and Left adnexal mass. She is scheduled to have cysto tomorrow 8/12 with Dr Karsten Ro. She was admitted for further evaluation of abd pain, surgery is following for possible cholecystitis. Will arrange for HD  Past Medical History  Diagnosis Date  . Diabetes mellitus without complication   . Renal disorder   . Hypertension   . Pneumonia    Past Surgical History  Procedure Laterality Date  . Cesarean section    . Eye surgery      retinal detachment   Family History  Problem Relation Age of Onset  . Diabetes Maternal Grandmother    Social History:  reports that she has never smoked. She has never used smokeless tobacco. She reports that she does not drink alcohol or use illicit drugs. Allergies  Allergen Reactions  . Iohexol Hives, Itching and Swelling     Code: HIVES, Desc: ITCHING, HIVES, SWELLING- 13 HR PRE-MEDS REQUIRED- ASM, Onset Date: 16109604   . Latex Other (See Comments)    unknown   Prior to Admission medications   Medication Sig Start Date End Date Taking? Authorizing Provider  albuterol (PROVENTIL HFA;VENTOLIN HFA) 108 (90 BASE) MCG/ACT inhaler Inhale 1-2 puffs into the lungs every 6 (six) hours as needed for wheezing or shortness of breath.   Yes Historical Provider, MD  aspirin 81 MG tablet Take 81 mg by mouth daily.   Yes Historical Provider, MD  B Complex-C-Folic Acid (DIALYVITE TABLET) TABS Take 1 tablet by mouth daily at 12 noon.   Yes Historical Provider, MD  calcium acetate (PHOSLO) 667 MG capsule Take 667 mg by mouth 3 (three)  times daily with meals.    Yes Historical Provider, MD  cinacalcet (SENSIPAR) 60 MG tablet Take 60 mg by mouth daily.   Yes Historical Provider, MD  clopidogrel (PLAVIX) 75 MG tablet Take 75 mg by mouth daily.   Yes Historical Provider, MD  insulin lispro (HUMALOG KWIKPEN) 100 UNIT/ML KiwkPen Inject 0.1 mLs (10 Units total) into the skin 3 (three) times daily with meals. And pen needles 3/day 04/05/14  Yes Renato Shin, MD  levonorgestrel (MIRENA) 20 MCG/24HR IUD 1 each by Intrauterine route once.   Yes Historical Provider, MD  lidocaine-prilocaine (EMLA) cream Apply 1 application topically daily as needed (dialysis).    Yes Historical Provider, MD  pioglitazone (ACTOS) 15 MG tablet Take 15 mg by mouth daily.   Yes Historical Provider, MD  sevelamer carbonate (RENVELA) 800 MG tablet Take 4,000-4,800 mg by mouth 3 (three) times daily with meals.    Yes Historical Provider, MD  ciprofloxacin (CIPRO) 250 MG tablet Take 1 tablet (250 mg total) by mouth daily with breakfast. Patient not taking: Reported on 10/06/2014 09/24/14   Caren Griffins, MD  glucose blood (ONE TOUCH ULTRA TEST) test strip 1 each by Other route 2 (two) times daily. And lancets 2/day 04/05/14   Renato Shin, MD  Insulin Pen Needle (DROPLET PEN NEEDLES) 31G X 5 MM MISC Use to inject insulin 3 times per day 09/10/14   Renato Shin, MD   Current Facility-Administered Medications  Medication Dose  Route Frequency Provider Last Rate Last Dose  . 0.9 %  sodium chloride infusion  250 mL Intravenous PRN Phillips Grout, MD      . albuterol (PROVENTIL) (2.5 MG/3ML) 0.083% nebulizer solution 3 mL  3 mL Inhalation Q6H PRN Phillips Grout, MD      . cefTRIAXone (ROCEPHIN) 2 g in dextrose 5 % 50 mL IVPB  2 g Intravenous Q24H Nat Christen, PA-C   2 g at 10/07/14 1058  . doxercalciferol (HECTOROL) injection 4 mcg  4 mcg Intravenous Q T,Th,Sa-HD Shelle Iron, NP      . HYDROmorphone (DILAUDID) injection 0.5 mg  0.5 mg Intravenous Q6H PRN Noemi Chapel,  MD      . insulin aspart (novoLOG) injection 0-9 Units  0-9 Units Subcutaneous 6 times per day Phillips Grout, MD   5 Units at 10/07/14 0357  . ondansetron (ZOFRAN) injection 4 mg  4 mg Intravenous Q8H PRN Noemi Chapel, MD      . sodium chloride 0.9 % injection 3 mL  3 mL Intravenous Q12H Phillips Grout, MD   3 mL at 10/07/14 0103  . sodium chloride 0.9 % injection 3 mL  3 mL Intravenous PRN Phillips Grout, MD       Labs: Basic Metabolic Panel:  Recent Labs Lab 10/06/14 2107 10/07/14 0640  NA 134* 136  K 5.3* 4.3  CL 96* 98*  CO2 26 23  GLUCOSE 379* 142*  BUN 46* 49*  CREATININE 8.73* 9.21*  CALCIUM 8.3* 8.0*   Liver Function Tests:  Recent Labs Lab 10/06/14 1254 10/06/14 2107 10/07/14 0640  AST 11 16 12*  ALT 11 14 10*  ALKPHOS 73 70 52  BILITOT 0.5 0.4 0.5  PROT 7.3 6.8 5.7*  ALBUMIN 3.9 3.1* 2.5*    Recent Labs Lab 10/06/14 1254 10/06/14 2107  LIPASE 41 40   No results for input(s): AMMONIA in the last 168 hours. CBC:  Recent Labs Lab 10/06/14 1308 10/06/14 2107 10/07/14 0640  WBC 14.2* 11.4* 8.6  HGB 11.8* 11.9* 10.5*  HCT 38.7 36.1 31.7*  MCV 90.6 91.9 91.6  PLT  --  345 318   Cardiac Enzymes: No results for input(s): CKTOTAL, CKMB, CKMBINDEX, TROPONINI in the last 168 hours. CBG:  Recent Labs Lab 10/07/14 0027 10/07/14 0353 10/07/14 0810  GLUCAP 276* 262* 119*   Iron Studies: No results for input(s): IRON, TIBC, TRANSFERRIN, FERRITIN in the last 72 hours. Studies/Results: US Abdomen Complete  10/06/2014   CLINICAL DATA:  Upper abdominal pain  EXAM: ULTRASOUND ABDOMEN COMPLETE  COMPARISON:  CT abdomen and pelvis September 23, 2014  FINDINGS: Gallbladder: Within the gallbladder, there are multiple echogenic foci which move and shadow consistent with gallstones. Largest individual gallstone measures 1.4 cm in length. There is no gallbladder wall thickening or pericholecystic fluid. Patient is focally tender over the gallbladder.  Common bile duct:  Diameter: 4 mm. There is no intrahepatic, common hepatic, or common bile duct dilatation.  Liver: No focal lesion identified. Within normal limits in parenchymal echogenicity.  IVC: No abnormality visualized.  Pancreas: Visualized portion unremarkable. Much of the pancreas is obscured by gas.  Spleen: Size and appearance within normal limits.  Right Kidney: Length: 8.9 cm. Echogenicity is increased. There is renal cortical thinning. No mass or hydronephrosis visualized.  Left Kidney: Length: 7.6 cm. Echogenicity is increased. There is borderline renal cortical thinning. No mass or hydronephrosis visualized.  Abdominal aorta: No aneurysm visualized.  Other findings: No demonstrable  ascites.  IMPRESSION: Cholelithiasis with focal tenderness over the gallbladder. These are findings concerning for early cholecystitis.  Small kidneys with increased echogenicity and renal cortical thinning. These findings are concerning for medical renal disease.  Most of pancreas obscured by gas. Visualized portions of pancreas appear normal.  Study otherwise unremarkable.   Electronically Signed   By: Lowella Grip III M.D.   On: 10/06/2014 21:05   Dg Abd Acute W/chest  10/06/2014   CLINICAL DATA:  Indigestion for last 4 hours, nausea without vomiting, epigastric pain, belching, heartburn, end-stage renal disease post dialysis graft placement 2 days ago, diabetes mellitus, hypertension  EXAM: DG ABDOMEN ACUTE W/ 1V CHEST  COMPARISON:  09/22/2014  FINDINGS: Normal heart size, mediastinal contours, and pulmonary vascularity.  Lungs clear.  No pleural effusion or pneumothorax.  Vascular grafts in the LEFT upper arm and at the LEFT axillary region.  Nonobstructive bowel gas pattern.  IUD projects over pelvis.  No bowel dilatation or bowel wall thickening, or free intraperitoneal air.  Small LEFT pelvic phleboliths stable.  No urinary tract calcification or acute osseous findings.  IMPRESSION: No acute abnormalities.   Electronically  Signed   By: Lavonia Dana M.D.   On: 10/06/2014 15:01    Review of Systems: abd pain is her only complaint Not cooperative with exam  Physical Exam: Filed Vitals:   10/06/14 2315 10/07/14 0030 10/07/14 0356 10/07/14 0845  BP: 128/66 147/63 137/61 124/68  Pulse: 87 85 84 77  Temp:  98.5 F (36.9 C) 98.4 F (36.9 C) 97.3 F (36.3 C)  TempSrc:    Oral  Resp:  _0 Weight:  78.6 kg (173 lb 4.5 oz)    SpO2: 100% 100% 98% 98%     General: Well developed, well nourished, in no acute distress. Head: Normocephalic, atraumatic, sclera non-icteric, mucus membranes are moist Neck: Supple. JVD not elevated. Lungs: Clear bilaterally to auscultation without wheezes, rales, or rhonchi. Breathing is unlabored. Heart: RRR with S1 S2. No murmurs, rubs, or gallops appreciated. Abdomen: soft, mild epigastric tenderness. +BS M-S:  Strength and tone appear normal for age. Lower extremities:without edema or ischemic changes, no open wounds  Neuro: Alert and oriented X 3. Moves all extremities spontaneously. Psych:  Responds to questions appropriately with a normal affect. Dialysis Access: L AVG +b/t   Dialysis Orders:  [facility] 4hrs    75.5kgs   2k/2ca    LAVG    3500u heparin hectorol 4  Assessment/Plan: 1.  abd pain- [place]- cholelithiasis. HIDA scan pending. General surgery following. Rocephin 2. R hydronephrosis- urology following- plan for cysto tomorrow 3.  ESRD -  [facility], reports apt at [facility] Monday 8/15 to have stents placed in access- had fistulogram 8/5 for persistant low AF 4.  Hypertension/volume  - 124/68 no volume excess. Up 2-3 kg by wt 5.  Anemia  - hgb 10.5 no ESA or Fe. Watch labs 6.  Metabolic bone disease - last phos 6.9 and PTH 293 cont hectorol, phoslo and sensipar 7.  Nutrition - NPO, alb 2.5 8. DM- per primary  Shelle Iron, NP [facility] [phone] 10/07/2014, 11:44 AM   Pt seen, examined and agree w A/P as above.  Kelly Splinter MD pager  [phone]    cell [phone] 10/07/2014, 2:59 PM

## 2014-10-07 NOTE — Progress Notes (Signed)
I saw the patient was admitted with right upper quadrant pain and it looks like she was having pain in in the right upper quadrant again.  She was having this pain when I saw her initially and she was found to have some right hydronephrosis.  During this admission she came in and she had a renal ultrasound done that shows complete resolution of her hydronephrosis now.  It appears her right upper quadrant pain is likely from her gallbladder and not from her kidney but either way with her hydronephrosis now having resolved she does not need any further urologic evaluation.  I have discussed this with the patient.  Her urologic procedure will be canceled.

## 2014-10-08 ENCOUNTER — Ambulatory Visit (HOSPITAL_COMMUNITY): Admission: RE | Admit: 2014-10-08 | Payer: Medicare Other | Source: Ambulatory Visit | Admitting: Urology

## 2014-10-08 ENCOUNTER — Encounter (HOSPITAL_COMMUNITY): Payer: Self-pay | Admitting: Certified Registered"

## 2014-10-08 ENCOUNTER — Encounter (HOSPITAL_COMMUNITY)
Admission: EM | Disposition: A | Discharge status: Home / Self Care | Payer: Self-pay | Source: Home / Self Care | Attending: Internal Medicine

## 2014-10-08 ENCOUNTER — Encounter (HOSPITAL_COMMUNITY): Admission: RE | Payer: Self-pay | Source: Ambulatory Visit

## 2014-10-08 ENCOUNTER — Inpatient Hospital Stay (HOSPITAL_COMMUNITY): Payer: Medicare Other | Admitting: Certified Registered"

## 2014-10-08 HISTORY — PX: CHOLECYSTECTOMY: SHX55

## 2014-10-08 LAB — CBC
HCT: 31.6 % — ABNORMAL LOW (ref 36.0–46.0)
HEMOGLOBIN: 10.4 g/dL — AB (ref 12.0–15.0)
MCH: 29.6 pg (ref 26.0–34.0)
MCHC: 32.9 g/dL (ref 30.0–36.0)
MCV: 90 fL (ref 78.0–100.0)
Platelets: 355 10*3/uL (ref 150–400)
RBC: 3.51 MIL/uL — AB (ref 3.87–5.11)
RDW: 14.2 % (ref 11.5–15.5)
WBC: 8.9 10*3/uL (ref 4.0–10.5)

## 2014-10-08 LAB — PHOSPHORUS: PHOSPHORUS: 6.2 mg/dL — AB (ref 2.5–4.6)

## 2014-10-08 LAB — COMPREHENSIVE METABOLIC PANEL
ALT: 12 U/L — ABNORMAL LOW (ref 14–54)
AST: 11 U/L — ABNORMAL LOW (ref 15–41)
Albumin: 2.6 g/dL — ABNORMAL LOW (ref 3.5–5.0)
Alkaline Phosphatase: 61 U/L (ref 38–126)
Anion gap: 13 (ref 5–15)
BUN: 57 mg/dL — ABNORMAL HIGH (ref 6–20)
CALCIUM: 8.2 mg/dL — AB (ref 8.9–10.3)
CHLORIDE: 99 mmol/L — AB (ref 101–111)
CO2: 23 mmol/L (ref 22–32)
CREATININE: 10.62 mg/dL — AB (ref 0.44–1.00)
GFR calc Af Amer: 4 mL/min — ABNORMAL LOW (ref >60)
GFR calc non Af Amer: 4 mL/min — ABNORMAL LOW (ref >60)
Glucose, Bld: 189 mg/dL — ABNORMAL HIGH (ref 65–99)
POTASSIUM: 4.7 mmol/L (ref 3.5–5.1)
Sodium: 135 mmol/L (ref 135–145)
Total Bilirubin: 0.6 mg/dL (ref 0.3–1.2)
Total Protein: 6 g/dL — ABNORMAL LOW (ref 6.5–8.1)

## 2014-10-08 LAB — GLUCOSE, CAPILLARY
GLUCOSE-CAPILLARY: 181 mg/dL — AB (ref 65–99)
Glucose-Capillary: 114 mg/dL — ABNORMAL HIGH (ref 65–99)
Glucose-Capillary: 112 mg/dL — ABNORMAL HIGH (ref 65–99)
Glucose-Capillary: 146 mg/dL — ABNORMAL HIGH (ref 65–99)
Glucose-Capillary: 153 mg/dL — ABNORMAL HIGH (ref 65–99)

## 2014-10-08 LAB — URINE CULTURE
COLONY COUNT: NO GROWTH
Organism ID, Bacteria: NO GROWTH

## 2014-10-08 LAB — MAGNESIUM: Magnesium: 2.1 mg/dL (ref 1.7–2.4)

## 2014-10-08 SURGERY — LAPAROSCOPIC CHOLECYSTECTOMY WITH INTRAOPERATIVE CHOLANGIOGRAM
Anesthesia: General | Site: Abdomen

## 2014-10-08 SURGERY — CYSTOURETEROSCOPY, WITH RETROGRADE PYELOGRAM AND STENT INSERTION
Anesthesia: General | Laterality: Right

## 2014-10-08 MED ORDER — PROPOFOL 10 MG/ML IV BOLUS
INTRAVENOUS | Status: DC | PRN
  Administered 2014-10-08: 150 mg via INTRAVENOUS

## 2014-10-08 MED ORDER — HEMOSTATIC AGENTS (NO CHARGE) OPTIME
TOPICAL | Status: DC | PRN
  Administered 2014-10-08: 1 via TOPICAL

## 2014-10-08 MED ORDER — IOHEXOL 300 MG/ML  SOLN: INTRAMUSCULAR | Status: DC | PRN

## 2014-10-08 MED ORDER — GLYCOPYRROLATE 0.2 MG/ML IJ SOLN
INTRAMUSCULAR | Status: AC
  Filled 2014-10-08: qty 2

## 2014-10-08 MED ORDER — MEPERIDINE HCL 25 MG/ML IJ SOLN: 6.2500 mg | INTRAMUSCULAR | Status: DC | PRN

## 2014-10-08 MED ORDER — MIDAZOLAM HCL 2 MG/2ML IJ SOLN: 0.5000 mg | Freq: Once | INTRAMUSCULAR | Status: DC | PRN

## 2014-10-08 MED ORDER — DOXERCALCIFEROL 4 MCG/2ML IV SOLN
INTRAVENOUS | Status: AC
Start: 2014-10-08 — End: 2014-10-08
  Filled 2014-10-08: qty 2

## 2014-10-08 MED ORDER — MIDAZOLAM HCL 2 MG/2ML IJ SOLN
INTRAMUSCULAR | Status: AC
  Filled 2014-10-08: qty 4

## 2014-10-08 MED ORDER — PROMETHAZINE HCL 25 MG/ML IJ SOLN: 6.2500 mg | INTRAMUSCULAR | Status: DC | PRN

## 2014-10-08 MED ORDER — FENTANYL CITRATE (PF) 100 MCG/2ML IJ SOLN
INTRAMUSCULAR | Status: AC
  Filled 2014-10-08: qty 2

## 2014-10-08 MED ORDER — HEPARIN SODIUM (PORCINE) 1000 UNIT/ML DIALYSIS: 20.0000 [IU]/kg | Freq: Once | INTRAMUSCULAR | Status: DC

## 2014-10-08 MED ORDER — ROCURONIUM BROMIDE 50 MG/5ML IV SOLN
INTRAVENOUS | Status: AC
  Filled 2014-10-08: qty 1

## 2014-10-08 MED ORDER — NEPRO/CARBSTEADY PO LIQD: 237.0000 mL | ORAL | Status: DC | PRN

## 2014-10-08 MED ORDER — LIDOCAINE HCL (PF) 1 % IJ SOLN
5.0000 mL | INTRAMUSCULAR | Status: DC | PRN
  Administered 2014-10-08: 5 mL via INTRADERMAL

## 2014-10-08 MED ORDER — BUPIVACAINE-EPINEPHRINE (PF) 0.25% -1:200000 IJ SOLN
INTRAMUSCULAR | Status: AC
  Filled 2014-10-08: qty 30

## 2014-10-08 MED ORDER — FENTANYL CITRATE (PF) 250 MCG/5ML IJ SOLN
INTRAMUSCULAR | Status: AC
  Filled 2014-10-08: qty 5

## 2014-10-08 MED ORDER — OXYCODONE-ACETAMINOPHEN 5-325 MG PO TABS
1.0000 | ORAL_TABLET | ORAL | Status: DC | PRN
  Administered 2014-10-08 – 2014-10-09 (×3): 1 via ORAL
  Filled 2014-10-08 (×3): qty 1

## 2014-10-08 MED ORDER — CEFAZOLIN SODIUM-DEXTROSE 2-3 GM-% IV SOLR
INTRAVENOUS | Status: AC
Start: 2014-10-08 — End: 2014-10-08
  Filled 2014-10-08: qty 50

## 2014-10-08 MED ORDER — ONDANSETRON HCL 4 MG/2ML IJ SOLN
INTRAMUSCULAR | Status: AC
  Filled 2014-10-08: qty 2

## 2014-10-08 MED ORDER — GLYCOPYRROLATE 0.2 MG/ML IJ SOLN
INTRAMUSCULAR | Status: DC | PRN
  Administered 2014-10-08: .6 mg via INTRAVENOUS

## 2014-10-08 MED ORDER — ALBUTEROL SULFATE HFA 108 (90 BASE) MCG/ACT IN AERS
INHALATION_SPRAY | RESPIRATORY_TRACT | Status: AC
  Filled 2014-10-08: qty 6.7

## 2014-10-08 MED ORDER — SODIUM CHLORIDE 0.9 % IV SOLN: 100.0000 mL | INTRAVENOUS | Status: DC | PRN

## 2014-10-08 MED ORDER — ALTEPLASE 2 MG IJ SOLR: 2.0000 mg | Freq: Once | INTRAMUSCULAR | Status: DC | PRN

## 2014-10-08 MED ORDER — SODIUM CHLORIDE 0.9 % IV SOLN
INTRAVENOUS | Status: DC
  Administered 2014-10-08: 13:00:00 via INTRAVENOUS

## 2014-10-08 MED ORDER — MIDAZOLAM HCL 5 MG/5ML IJ SOLN
INTRAMUSCULAR | Status: DC | PRN
  Administered 2014-10-08: 2 mg via INTRAVENOUS

## 2014-10-08 MED ORDER — LIDOCAINE-PRILOCAINE 2.5-2.5 % EX CREA: 1.0000 "application " | TOPICAL_CREAM | CUTANEOUS | Status: DC | PRN

## 2014-10-08 MED ORDER — CISATRACURIUM BESYLATE (PF) 10 MG/5ML IV SOLN
INTRAVENOUS | Status: DC | PRN
  Administered 2014-10-08: 1200 ug via INTRAVENOUS

## 2014-10-08 MED ORDER — SODIUM CHLORIDE 0.9 % IR SOLN
Status: DC | PRN
  Administered 2014-10-08: 1

## 2014-10-08 MED ORDER — CISATRACURIUM BESYLATE 20 MG/10ML IV SOLN
INTRAVENOUS | Status: AC
  Filled 2014-10-08: qty 10

## 2014-10-08 MED ORDER — LIDOCAINE HCL (CARDIAC) 20 MG/ML IV SOLN
INTRAVENOUS | Status: DC | PRN
  Administered 2014-10-08: 20 mg via INTRAVENOUS

## 2014-10-08 MED ORDER — CEFAZOLIN SODIUM-DEXTROSE 2-3 GM-% IV SOLR
INTRAVENOUS | Status: DC | PRN
  Administered 2014-10-08: 2 g via INTRAVENOUS

## 2014-10-08 MED ORDER — GLYCOPYRROLATE 0.2 MG/ML IJ SOLN
INTRAMUSCULAR | Status: AC
  Filled 2014-10-08: qty 1

## 2014-10-08 MED ORDER — LIDOCAINE HCL (CARDIAC) 20 MG/ML IV SOLN
INTRAVENOUS | Status: AC
  Filled 2014-10-08: qty 10

## 2014-10-08 MED ORDER — BUPIVACAINE-EPINEPHRINE 0.25% -1:200000 IJ SOLN
INTRAMUSCULAR | Status: DC | PRN
  Administered 2014-10-08: 8 mL

## 2014-10-08 MED ORDER — 0.9 % SODIUM CHLORIDE (POUR BTL) OPTIME
TOPICAL | Status: DC | PRN
  Administered 2014-10-08: 100 mL

## 2014-10-08 MED ORDER — PENTAFLUOROPROP-TETRAFLUOROETH EX AERO: 1.0000 "application " | INHALATION_SPRAY | CUTANEOUS | Status: DC | PRN

## 2014-10-08 MED ORDER — ONDANSETRON HCL 4 MG/2ML IJ SOLN
INTRAMUSCULAR | Status: DC | PRN
  Administered 2014-10-08: 4 mg via INTRAVENOUS

## 2014-10-08 MED ORDER — HEPARIN SODIUM (PORCINE) 1000 UNIT/ML DIALYSIS: 1000.0000 [IU] | INTRAMUSCULAR | Status: DC | PRN

## 2014-10-08 MED ORDER — PROPOFOL 10 MG/ML IV BOLUS
INTRAVENOUS | Status: AC
  Filled 2014-10-08: qty 20

## 2014-10-08 MED ORDER — NEOSTIGMINE METHYLSULFATE 10 MG/10ML IV SOLN
INTRAVENOUS | Status: AC
Start: 2014-10-08 — End: 2014-10-08
  Filled 2014-10-08: qty 1

## 2014-10-08 MED ORDER — FENTANYL CITRATE (PF) 100 MCG/2ML IJ SOLN
25.0000 ug | INTRAMUSCULAR | Status: DC | PRN
  Administered 2014-10-08: 50 ug via INTRAVENOUS
  Administered 2014-10-08 (×2): 25 ug via INTRAVENOUS

## 2014-10-08 MED ORDER — NEOSTIGMINE METHYLSULFATE 10 MG/10ML IV SOLN
INTRAVENOUS | Status: DC | PRN
  Administered 2014-10-08: 4 mg via INTRAVENOUS

## 2014-10-08 MED ORDER — FENTANYL CITRATE (PF) 250 MCG/5ML IJ SOLN
INTRAMUSCULAR | Status: DC | PRN
  Administered 2014-10-08: 100 ug via INTRAVENOUS

## 2014-10-08 SURGICAL SUPPLY — 43 items
APPLIER CLIP ROT 10 11.4 M/L (STAPLE) ×2
APR CLP MED LRG 11.4X10 (STAPLE) ×1
BAG SPEC RTRVL LRG 6X4 10 (ENDOMECHANICALS) ×1
BLADE SURG ROTATE 9660 (MISCELLANEOUS) IMPLANT
CANISTER SUCTION 2500CC (MISCELLANEOUS) ×2 IMPLANT
CHLORAPREP W/TINT 26ML (MISCELLANEOUS) ×2 IMPLANT
CLIP APPLIE ROT 10 11.4 M/L (STAPLE) ×1 IMPLANT
COVER MAYO STAND STRL (DRAPES) ×2 IMPLANT
COVER SURGICAL LIGHT HANDLE (MISCELLANEOUS) ×2 IMPLANT
DRAPE C-ARM 42X72 X-RAY (DRAPES) ×2 IMPLANT
DRAPE WARM FLUID 44X44 (DRAPE) ×2 IMPLANT
ELECT REM PT RETURN 9FT ADLT (ELECTROSURGICAL) ×2
ELECTRODE REM PT RTRN 9FT ADLT (ELECTROSURGICAL) ×1 IMPLANT
GLOVE BIO SURGEON STRL SZ8 (GLOVE) ×2 IMPLANT
GLOVE BIOGEL PI IND STRL 7.0 (GLOVE) IMPLANT
GLOVE BIOGEL PI IND STRL 8 (GLOVE) ×1 IMPLANT
GLOVE BIOGEL PI INDICATOR 7.0 (GLOVE) ×2
GLOVE BIOGEL PI INDICATOR 8 (GLOVE) ×1
GLOVE SURG SS PI 7.0 STRL IVOR (GLOVE) ×1 IMPLANT
GLOVE SURG SS PI 8.0 STRL IVOR (GLOVE) ×2 IMPLANT
GOWN STRL REUS W/ TWL LRG LVL3 (GOWN DISPOSABLE) ×2 IMPLANT
GOWN STRL REUS W/ TWL XL LVL3 (GOWN DISPOSABLE) ×1 IMPLANT
GOWN STRL REUS W/TWL LRG LVL3 (GOWN DISPOSABLE) ×4
GOWN STRL REUS W/TWL XL LVL3 (GOWN DISPOSABLE) ×2
KIT BASIN OR (CUSTOM PROCEDURE TRAY) ×2 IMPLANT
KIT ROOM TURNOVER OR (KITS) ×2 IMPLANT
LIQUID BAND (GAUZE/BANDAGES/DRESSINGS) ×2 IMPLANT
NS IRRIG 1000ML POUR BTL (IV SOLUTION) ×2 IMPLANT
PAD ARMBOARD 7.5X6 YLW CONV (MISCELLANEOUS) ×2 IMPLANT
POUCH SPECIMEN RETRIEVAL 10MM (ENDOMECHANICALS) ×2 IMPLANT
SCISSORS LAP 5X35 DISP (ENDOMECHANICALS) ×2 IMPLANT
SET CHOLANGIOGRAPH 5 50 .035 (SET/KITS/TRAYS/PACK) ×2 IMPLANT
SET IRRIG TUBING LAPAROSCOPIC (IRRIGATION / IRRIGATOR) ×2 IMPLANT
SLEEVE ENDOPATH XCEL 5M (ENDOMECHANICALS) ×2 IMPLANT
SPECIMEN JAR SMALL (MISCELLANEOUS) ×2 IMPLANT
SUT MNCRL AB 4-0 PS2 18 (SUTURE) ×2 IMPLANT
TOWEL OR 17X24 6PK STRL BLUE (TOWEL DISPOSABLE) ×2 IMPLANT
TOWEL OR 17X26 10 PK STRL BLUE (TOWEL DISPOSABLE) ×2 IMPLANT
TRAY LAPAROSCOPIC MC (CUSTOM PROCEDURE TRAY) ×2 IMPLANT
TROCAR XCEL BLUNT TIP 100MML (ENDOMECHANICALS) ×2 IMPLANT
TROCAR XCEL NON-BLD 11X100MML (ENDOMECHANICALS) ×2 IMPLANT
TROCAR XCEL NON-BLD 5MMX100MML (ENDOMECHANICALS) ×2 IMPLANT
TUBING INSUFFLATION (TUBING) ×2 IMPLANT

## 2014-10-08 NOTE — Progress Notes (Signed)
[facility] Progress Note     Subjective: Pt still has pain in RUQ.  No N/V.  Ambulating well.  Anxious to proceed with surgery.  NPO  Objective: Vital signs in last 24 hours: Temp:  [97.2 F (36.2 C)-98.4 F (36.9 C)] 98.4 F (36.9 C) (08/12 0700) Pulse Rate:  [84-95] 86 (08/12 0700) Resp:  [16-20] 16 (08/12 0700) BP: (114-155)/(55-82) 135/66 mmHg (08/12 0700) SpO2:  [95 %-99 %] 99 % (08/12 0700) Weight:  [78.2 kg (172 lb 6.4 oz)-79.1 kg (174 lb 6.1 oz)] 78.2 kg (172 lb 6.4 oz) (08/12 0400) Last BM Date: 10/06/14  Intake/Output from previous day: 08/11 0701 - 08/12 0700 In: 340 [P.O.:240; IV Piggyback:100] Out: 0  Intake/Output this shift:    PE: Gen:  Alert, NAD, pleasant Abd: Soft, tenderness in RUQ, ND, +BS, no HSM   Lab Results:   Recent Labs  10/07/14 0640 10/08/14 0430  WBC 8.6 8.9  HGB 10.5* 10.4*  HCT 31.7* 31.6*  PLT 318 355   BMET  Recent Labs  10/07/14 0640 10/08/14 0430  NA 136 135  K 4.3 4.7  CL 98* 99*  CO2 23 23  GLUCOSE 142* 189*  BUN 49* 57*  CREATININE 9.21* 10.62*  CALCIUM 8.0* 8.2*   PT/INR No results for input(s): LABPROT, INR in the last 72 hours. CMP     Component Value Date/Time   NA 135 10/08/2014 0430   K 4.7 10/08/2014 0430   CL 99* 10/08/2014 0430   CO2 23 10/08/2014 0430   GLUCOSE 189* 10/08/2014 0430   BUN 57* 10/08/2014 0430   CREATININE 10.62* 10/08/2014 0430   CALCIUM 8.2* 10/08/2014 0430   PROT 6.0* 10/08/2014 0430   ALBUMIN 2.6* 10/08/2014 0430   AST 11* 10/08/2014 0430   ALT 12* 10/08/2014 0430   ALKPHOS 61 10/08/2014 0430   BILITOT 0.6 10/08/2014 0430   GFRNONAA 4* 10/08/2014 0430   GFRAA 4* 10/08/2014 0430   Lipase     Component Value Date/Time   LIPASE 40 10/06/2014 2107       Studies/Results: Nm Hepatobiliary Including Gb  10/07/2014   CLINICAL DATA:  Upper abdominal pain.  Cholelithiasis.  EXAM: NUCLEAR MEDICINE HEPATOBILIARY IMAGING WITH GALLBLADDER EF  TECHNIQUE:  Sequential images of the abdomen were obtained out to 60 minutes following intravenous administration of radiopharmaceutical. After slow intravenous infusion of 1.57 micrograms Cholecystokinin, gallbladder ejection fraction was determined.  RADIOPHARMACEUTICALS:  5.0 mCi Tc-65m Choletec IV  COMPARISON:  Ultrasound dated 10/06/2014  FINDINGS: The gallbladder is visualized at 10 min. There is activity in the duodenum at 10 min. At 59 min the ejection fraction was 1.2%. At 45 min, normal ejection fraction is greater than 40%.  IMPRESSION: Abnormal hepatobiliary scan. The gallbladder visualizes normally but there is no contraction with CCK infusion. 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   Electronically Signed   By: Lorriane Shire M.D.   On: 10/07/2014 16:03   US Abdomen Complete  10/06/2014   CLINICAL DATA:  Upper abdominal pain  EXAM: ULTRASOUND ABDOMEN COMPLETE  COMPARISON:  CT abdomen and pelvis September 23, 2014  FINDINGS: Gallbladder: Within the gallbladder, there are multiple echogenic foci which move and shadow consistent with gallstones. Largest individual gallstone measures 1.4 cm in length. There is no gallbladder wall thickening or pericholecystic fluid. Patient is focally tender over the gallbladder.  Common bile duct: Diameter: 4 mm. There is no intrahepatic, common hepatic, or common bile duct dilatation.  Liver: No focal lesion  identified. Within normal limits in parenchymal echogenicity.  IVC: No abnormality visualized.  Pancreas: Visualized portion unremarkable. Much of the pancreas is obscured by gas.  Spleen: Size and appearance within normal limits.  Right Kidney: Length: 8.9 cm. Echogenicity is increased. There is renal cortical thinning. No mass or hydronephrosis visualized.  Left Kidney: Length: 7.6 cm. Echogenicity is increased. There is borderline renal cortical thinning. No mass or hydronephrosis visualized.  Abdominal aorta: No aneurysm visualized.  Other findings: No  demonstrable ascites.  IMPRESSION: Cholelithiasis with focal tenderness over the gallbladder. These are findings concerning for early cholecystitis.  Small kidneys with increased echogenicity and renal cortical thinning. These findings are concerning for medical renal disease.  Most of pancreas obscured by gas. Visualized portions of pancreas appear normal.  Study otherwise unremarkable.   Electronically Signed   By: Lowella Grip III M.D.   On: 10/06/2014 21:05   Dg Abd Acute W/chest  10/06/2014   CLINICAL DATA:  Indigestion for last 4 hours, nausea without vomiting, epigastric pain, belching, heartburn, end-stage renal disease post dialysis graft placement 2 days ago, diabetes mellitus, hypertension  EXAM: DG ABDOMEN ACUTE W/ 1V CHEST  COMPARISON:  09/22/2014  FINDINGS: Normal heart size, mediastinal contours, and pulmonary vascularity.  Lungs clear.  No pleural effusion or pneumothorax.  Vascular grafts in the LEFT upper arm and at the LEFT axillary region.  Nonobstructive bowel gas pattern.  IUD projects over pelvis.  No bowel dilatation or bowel wall thickening, or free intraperitoneal air.  Small LEFT pelvic phleboliths stable.  No urinary tract calcification or acute osseous findings.  IMPRESSION: No acute abnormalities.   Electronically Signed   By: Lavonia Dana M.D.   On: 10/06/2014 15:01    Anti-infectives: Anti-infectives    Start     Dose/Rate Route Frequency Ordered Stop   10/07/14 1000  cefTRIAXone (ROCEPHIN) 2 g in dextrose 5 % 50 mL IVPB  Status:  Discontinued    Comments:  Pharmacy may adjust dosing strength / duration / interval for maximal efficacy   2 g 100 mL/hr over 30 Minutes Intravenous Every 24 hours 10/07/14 0945 10/07/14 1642   10/07/14 0800  piperacillin-tazobactam (ZOSYN) IVPB 2.25 g  Status:  Discontinued     2.25 g 100 mL/hr over 30 Minutes Intravenous Every 8 hours 10/07/14 0741 10/07/14 0945       Assessment/Plan Biliary colic, possible cholecystitis -HIDA  scan shows biliary dyskinesia, will proceed with lap chole with possible IOC -NPO, IVF, pain control, antiemetics -Was pending procedure by Dr. Karsten Ro says her hydro has resolved, and thus doesn't need her scheduled procedure -WBC and LFT's normal -Rocephin Day #2, will not likely need to be continued after surgery -Discussed surgical risks/benefits, she would like to proceed  ESRD on HD     LOS: 2 days    Nat Christen 10/08/2014, 9:13 AM Pager: [phone]

## 2014-10-08 NOTE — Care Management Important Message (Signed)
Important Message  Patient Details  Name: Morgan [name] MRN: [id number] Date of Birth: 13-Feb-1971   Medicare Important Message Given:  Yes-second notification given    Delorse Lek 10/08/2014, 3:09 PM

## 2014-10-08 NOTE — H&P (View-Only) (Signed)
[facility] Progress Note     Subjective: Pt still has pain in RUQ.  No N/V.  Ambulating well.  Anxious to proceed with surgery.  NPO  Objective: Vital signs in last 24 hours: Temp:  [97.2 F (36.2 C)-98.4 F (36.9 C)] 98.4 F (36.9 C) (08/12 0700) Pulse Rate:  [84-95] 86 (08/12 0700) Resp:  [16-20] 16 (08/12 0700) BP: (114-155)/(55-82) 135/66 mmHg (08/12 0700) SpO2:  [95 %-99 %] 99 % (08/12 0700) Weight:  [78.2 kg (172 lb 6.4 oz)-79.1 kg (174 lb 6.1 oz)] 78.2 kg (172 lb 6.4 oz) (08/12 0400) Last BM Date: 10/06/14  Intake/Output from previous day: 08/11 0701 - 08/12 0700 In: 340 [P.O.:240; IV Piggyback:100] Out: 0  Intake/Output this shift:    PE: Gen:  Alert, NAD, pleasant Abd: Soft, tenderness in RUQ, ND, +BS, no HSM   Lab Results:   Recent Labs  10/07/14 0640 10/08/14 0430  WBC 8.6 8.9  HGB 10.5* 10.4*  HCT 31.7* 31.6*  PLT 318 355   BMET  Recent Labs  10/07/14 0640 10/08/14 0430  NA 136 135  K 4.3 4.7  CL 98* 99*  CO2 23 23  GLUCOSE 142* 189*  BUN 49* 57*  CREATININE 9.21* 10.62*  CALCIUM 8.0* 8.2*   PT/INR No results for input(s): LABPROT, INR in the last 72 hours. CMP     Component Value Date/Time   NA 135 10/08/2014 0430   K 4.7 10/08/2014 0430   CL 99* 10/08/2014 0430   CO2 23 10/08/2014 0430   GLUCOSE 189* 10/08/2014 0430   BUN 57* 10/08/2014 0430   CREATININE 10.62* 10/08/2014 0430   CALCIUM 8.2* 10/08/2014 0430   PROT 6.0* 10/08/2014 0430   ALBUMIN 2.6* 10/08/2014 0430   AST 11* 10/08/2014 0430   ALT 12* 10/08/2014 0430   ALKPHOS 61 10/08/2014 0430   BILITOT 0.6 10/08/2014 0430   GFRNONAA 4* 10/08/2014 0430   GFRAA 4* 10/08/2014 0430   Lipase     Component Value Date/Time   LIPASE 40 10/06/2014 2107       Studies/Results: Nm Hepatobiliary Including Gb  10/07/2014   CLINICAL DATA:  Upper abdominal pain.  Cholelithiasis.  EXAM: NUCLEAR MEDICINE HEPATOBILIARY IMAGING WITH GALLBLADDER EF  TECHNIQUE:  Sequential images of the abdomen were obtained out to 60 minutes following intravenous administration of radiopharmaceutical. After slow intravenous infusion of 1.57 micrograms Cholecystokinin, gallbladder ejection fraction was determined.  RADIOPHARMACEUTICALS:  5.0 mCi Tc-65m Choletec IV  COMPARISON:  Ultrasound dated 10/06/2014  FINDINGS: The gallbladder is visualized at 10 min. There is activity in the duodenum at 10 min. At 59 min the ejection fraction was 1.2%. At 45 min, normal ejection fraction is greater than 40%.  IMPRESSION: Abnormal hepatobiliary scan. The gallbladder visualizes normally but there is no contraction with CCK infusion. 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   Electronically Signed   By: Lorriane Shire M.D.   On: 10/07/2014 16:03   US Abdomen Complete  10/06/2014   CLINICAL DATA:  Upper abdominal pain  EXAM: ULTRASOUND ABDOMEN COMPLETE  COMPARISON:  CT abdomen and pelvis September 23, 2014  FINDINGS: Gallbladder: Within the gallbladder, there are multiple echogenic foci which move and shadow consistent with gallstones. Largest individual gallstone measures 1.4 cm in length. There is no gallbladder wall thickening or pericholecystic fluid. Patient is focally tender over the gallbladder.  Common bile duct: Diameter: 4 mm. There is no intrahepatic, common hepatic, or common bile duct dilatation.  Liver: No focal lesion  identified. Within normal limits in parenchymal echogenicity.  IVC: No abnormality visualized.  Pancreas: Visualized portion unremarkable. Much of the pancreas is obscured by gas.  Spleen: Size and appearance within normal limits.  Right Kidney: Length: 8.9 cm. Echogenicity is increased. There is renal cortical thinning. No mass or hydronephrosis visualized.  Left Kidney: Length: 7.6 cm. Echogenicity is increased. There is borderline renal cortical thinning. No mass or hydronephrosis visualized.  Abdominal aorta: No aneurysm visualized.  Other findings: No  demonstrable ascites.  IMPRESSION: Cholelithiasis with focal tenderness over the gallbladder. These are findings concerning for early cholecystitis.  Small kidneys with increased echogenicity and renal cortical thinning. These findings are concerning for medical renal disease.  Most of pancreas obscured by gas. Visualized portions of pancreas appear normal.  Study otherwise unremarkable.   Electronically Signed   By: Lowella Grip III M.D.   On: 10/06/2014 21:05   Dg Abd Acute W/chest  10/06/2014   CLINICAL DATA:  Indigestion for last 4 hours, nausea without vomiting, epigastric pain, belching, heartburn, end-stage renal disease post dialysis graft placement 2 days ago, diabetes mellitus, hypertension  EXAM: DG ABDOMEN ACUTE W/ 1V CHEST  COMPARISON:  09/22/2014  FINDINGS: Normal heart size, mediastinal contours, and pulmonary vascularity.  Lungs clear.  No pleural effusion or pneumothorax.  Vascular grafts in the LEFT upper arm and at the LEFT axillary region.  Nonobstructive bowel gas pattern.  IUD projects over pelvis.  No bowel dilatation or bowel wall thickening, or free intraperitoneal air.  Small LEFT pelvic phleboliths stable.  No urinary tract calcification or acute osseous findings.  IMPRESSION: No acute abnormalities.   Electronically Signed   By: Lavonia Dana M.D.   On: 10/06/2014 15:01    Anti-infectives: Anti-infectives    Start     Dose/Rate Route Frequency Ordered Stop   10/07/14 1000  cefTRIAXone (ROCEPHIN) 2 g in dextrose 5 % 50 mL IVPB  Status:  Discontinued    Comments:  Pharmacy may adjust dosing strength / duration / interval for maximal efficacy   2 g 100 mL/hr over 30 Minutes Intravenous Every 24 hours 10/07/14 0945 10/07/14 1642   10/07/14 0800  piperacillin-tazobactam (ZOSYN) IVPB 2.25 g  Status:  Discontinued     2.25 g 100 mL/hr over 30 Minutes Intravenous Every 8 hours 10/07/14 0741 10/07/14 0945       Assessment/Plan Biliary colic, possible cholecystitis -HIDA  scan shows biliary dyskinesia, will proceed with lap chole with possible IOC -NPO, IVF, pain control, antiemetics -Was pending procedure by Dr. Karsten Ro says her hydro has resolved, and thus doesn't need her scheduled procedure -WBC and LFT's normal -Rocephin Day #2, will not likely need to be continued after surgery -Discussed surgical risks/benefits, she would like to proceed  ESRD on HD     LOS: 2 days    Nat Christen 10/08/2014, 9:13 AM Pager: [phone]

## 2014-10-08 NOTE — Transfer of Care (Signed)
Immediate Anesthesia Transfer of Care Note  Patient: Morgan [name]  Procedure(s) Performed: Procedure(s): LAPAROSCOPIC CHOLECYSTECTOMY  (N/A)  Patient Location: PACU  Anesthesia Type:General  Level of Consciousness: awake, alert  and oriented  Airway & Oxygen Therapy: Patient Spontanous Breathing and Patient connected to face mask oxygen  Post-op Assessment: Report given to RN and Post -op Vital signs reviewed and stable  Post vital signs: Reviewed and stable  Last Vitals:  Filed Vitals:   10/08/14 1500  BP: 131/55  Pulse:   Temp: 36.7 C  Resp: 19    Complications: No apparent anesthesia complications

## 2014-10-08 NOTE — Anesthesia Postprocedure Evaluation (Signed)
  Anesthesia Post-op Note  Patient: Morgan [name]  Procedure(s) Performed: Procedure(s): LAPAROSCOPIC CHOLECYSTECTOMY  (N/A)  Patient Location: PACU  Anesthesia Type:General  Level of Consciousness: awake, alert , oriented and patient cooperative  Airway and Oxygen Therapy: Patient Spontanous Breathing  Post-op Pain: mild  Post-op Assessment: Post-op Vital signs reviewed, Patient's Cardiovascular Status Stable, Respiratory Function Stable, Patent Airway, No signs of Nausea or vomiting and Pain level controlled              Post-op Vital Signs: Reviewed and stable  Last Vitals:  Filed Vitals:   10/08/14 1547  BP: 117/57  Pulse: 74  Temp: 36.2 C  Resp:     Complications: No apparent anesthesia complications

## 2014-10-08 NOTE — Anesthesia Preprocedure Evaluation (Addendum)
Anesthesia Evaluation  Patient identified by MRN, date of birth, ID band Patient awake    Reviewed: Allergy & Precautions, NPO status , Patient's Chart, lab work & pertinent test results  History of Anesthesia Complications Negative for: history of anesthetic complications  Airway Mallampati: I  TM Distance: >3 FB Neck ROM: Full    Dental  (+) Dental Advisory Given   Pulmonary COPD COPD inhaler,  breath sounds clear to auscultation        Cardiovascular hypertension (controlled ), Rhythm:Regular Rate:Normal  4/16 Stress myoview: EF 63%, normal perfusion   Neuro/Psych Depression negative neurological ROS     GI/Hepatic Neg liver ROS, Acute cholecystitis   Endo/Other  diabetes (glu 112), Insulin Dependent, Oral Hypoglycemic AgentsMorbid obesity  Renal/GU ESRF and DialysisRenal disease (K+ 4.7, dialyzed today)     Musculoskeletal   Abdominal (+) + obese,   Peds  Hematology  (+) Blood dyscrasia (Hb 10.4), ,   Anesthesia Other Findings   Reproductive/Obstetrics                           Anesthesia Physical Anesthesia Plan  ASA: III  Anesthesia Plan: General   Post-op Pain Management:    Induction: Intravenous  Airway Management Planned: Oral ETT  Additional Equipment:   Intra-op Plan:   Post-operative Plan: Extubation in OR  Informed Consent: I have reviewed the patients History and Physical, chart, labs and discussed the procedure including the risks, benefits and alternatives for the proposed anesthesia with the patient or authorized representative who has indicated his/her understanding and acceptance.   Dental advisory given  Plan Discussed with: CRNA and Surgeon  Anesthesia Plan Comments: (Plan routine monitors, GETA)        Anesthesia Quick Evaluation

## 2014-10-08 NOTE — Anesthesia Procedure Notes (Signed)
Procedure Name: Intubation Date/Time: 10/08/2014 1:31 PM Performed by: Julian Reil Pre-anesthesia Checklist: Patient identified, Emergency Drugs available, Suction available and Patient being monitored Patient Re-evaluated:Patient Re-evaluated prior to inductionOxygen Delivery Method: Circle system utilized Preoxygenation: Pre-oxygenation with 100% oxygen Intubation Type: IV induction Ventilation: Mask ventilation without difficulty and Oral airway inserted - appropriate to patient size Laryngoscope Size: Mac and 3 Grade View: Grade II Tube type: Oral Tube size: 7.5 mm Number of attempts: 1 Airway Equipment and Method: Stylet Placement Confirmation: positive ETCO2,  ETT inserted through vocal cords under direct vision and breath sounds checked- equal and bilateral Secured at: 22 cm Tube secured with: Tape Dental Injury: Teeth and Oropharynx as per pre-operative assessment

## 2014-10-08 NOTE — Progress Notes (Signed)
Rings given to patients daughter.

## 2014-10-08 NOTE — Progress Notes (Addendum)
[facility] PROGRESS NOTE  Morgan [name] [id number] DOB: 1970-02-28 DOA: 10/06/2014 PCP: [name], NP  Brief Summary  44 yo female h/o esrd s/p renal transplant failed still on dialysis due tomorrow comes in with ruq abd pain all day today no worse with eating. Denies fever. Pt not very cooperative with interview, she is upset that she is here still in the ED and being admitted. She said we were "forcing her to stay". She reports she has other surgeries scheduled for later this week and this is going to mess up her other appointments. Her pain has resolved with treatment in the ED. No n/v. She still has her gallbladder. General surgery has seen the patient and requested HIDA scan in the am for which she was referred to [place] for admission for HIDA in am.   Assessment/Plan  Gallbladder dyskinesia with RUQ pain. HIDA scan which demonstrated normal gallbladder filling (NO acute cholecystitis), however, she had no contraction with CCK infusion.  Her previous right hydronephrosis had resolved on her recent ultrasound and therefore she does not need to follow-up with urology. -  Laparoscopic cholecystectomy on 8/12 -  Continue to hold plavix and aspirin -  Discontinue antibiotics  ESRD -  Appreciate nephrology assistance -  Plan for hemodialysis on 8/13  DM, type 2 with renal manifestations, CBG well controlled -  Continue low dose SSI  Left Adnexal mass - CT from a month ago demonstrated a 4.6 cm cystic and solid appearing left adnexal mass, suspicious for cystic ovarian neoplasm and pelvic ultrasound demonstrated cystic masses arising from the left ovary as well as a complex cystic mass measuring [phone] cm, representing simple hemorrhagic cyst versus cystadenoma.  -  Repeat US in 1 month  -  F/u with GYN scheduled for Monday  Diet:  Renal/diabetic Access:  PIV IVF:  off Proph:  SCDs  Code Status: Full code Family Communication: Patient alone Disposition  Plan:  Once tolerating diet, recovered from surgery, home after HD tomorrow   Consultants:  Nephrology  General surgery  Urology  Procedures:  Abdominal ultrasound  HIDA scan  Antibiotics:  Ceftriaxone once on 8/11   HPI/Subjective:  RUQ is better today.  Denies SOB, CP, nausea, vomiting.   Objective: Filed Vitals:   10/08/14 0600 10/08/14 0630 10/08/14 0700 10/08/14 1500  BP: 138/74 114/75 135/66 131/55  Pulse: 91 90 86   Temp:   98.4 F (36.9 C) 98 F (36.7 C)  TempSrc:   Oral   Resp:   16 19  Weight:      SpO2:   99% 99%    Intake/Output Summary (Last 24 hours) at 10/08/14 1511 Last data filed at 10/08/14 0829  Gross per 24 hour  Intake    240 ml  Output      0 ml  Net    240 ml   Filed Weights   10/07/14 0030 10/07/14 2100 10/08/14 0400  Weight: 78.6 kg (173 lb 4.5 oz) 79.1 kg (174 lb 6.1 oz) 78.2 kg (172 lb 6.4 oz)   Body mass index is 31.52 kg/(m^2).  Exam:   General:  Adult female, No acute distress  HEENT:  NCAT, MMM  Cardiovascular:  RRR  Respiratory:  CTAB, no increased WOB  Abdomen:   NABS, soft, mildly tender to palpation in the right upper quadrant and epigastrium without rebound or guarding  MSK:   Normal tone and bulk, no LEE  Neuro:  Grossly intact  Data Reviewed: Basic Metabolic Panel:  Recent  Labs Lab 10/06/14 2107 10/07/14 0640 10/08/14 0430  NA 134* 136 135  K 5.3* 4.3 4.7  CL 96* 98* 99*  CO2 26 23 23   GLUCOSE 379* 142* 189*  BUN 46* 49* 57*  CREATININE 8.73* 9.21* 10.62*  CALCIUM 8.3* 8.0* 8.2*  MG  --   --  2.1  PHOS  --   --  6.2*   Liver Function Tests:  Recent Labs Lab 10/06/14 1254 10/06/14 2107 10/07/14 0640 10/08/14 0430  AST 11 16 12* 11*  ALT 11 14 10* 12*  ALKPHOS 73 70 52 61  BILITOT 0.5 0.4 0.5 0.6  PROT 7.3 6.8 5.7* 6.0*  ALBUMIN 3.9 3.1* 2.5* 2.6*    Recent Labs Lab 10/06/14 1254 10/06/14 2107  LIPASE 41 40   No results for input(s): AMMONIA in the last 168  hours. CBC:  Recent Labs Lab 10/06/14 1308 10/06/14 2107 10/07/14 0640 10/08/14 0430  WBC 14.2* 11.4* 8.6 8.9  HGB 11.8* 11.9* 10.5* 10.4*  HCT 38.7 36.1 31.7* 31.6*  MCV 90.6 91.9 91.6 90.0  PLT  --  345 318 355    Recent Results (from the past 240 hour(s))  Urine culture     Status: None   Collection Time: 10/06/14  2:30 PM  Result Value Ref Range Status   Colony Count NO GROWTH  Final   Organism ID, Bacteria NO GROWTH  Final     Studies: Nm Hepatobiliary Including Gb  10/07/2014   CLINICAL DATA:  Upper abdominal pain.  Cholelithiasis.  EXAM: NUCLEAR MEDICINE HEPATOBILIARY IMAGING WITH GALLBLADDER EF  TECHNIQUE: Sequential images of the abdomen were obtained out to 60 minutes following intravenous administration of radiopharmaceutical. After slow intravenous infusion of 1.57 micrograms Cholecystokinin, gallbladder ejection fraction was determined.  RADIOPHARMACEUTICALS:  5.0 mCi Tc-42m Choletec IV  COMPARISON:  Ultrasound dated 10/06/2014  FINDINGS: The gallbladder is visualized at 10 min. There is activity in the duodenum at 10 min. At 59 min the ejection fraction was 1.2%. At 45 min, normal ejection fraction is greater than 40%.  IMPRESSION: Abnormal hepatobiliary scan. The gallbladder visualizes normally but there is no contraction with CCK infusion. 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   Electronically Signed   By: Lorriane Shire M.D.   On: 10/07/2014 16:03   US Abdomen Complete  10/06/2014   CLINICAL DATA:  Upper abdominal pain  EXAM: ULTRASOUND ABDOMEN COMPLETE  COMPARISON:  CT abdomen and pelvis September 23, 2014  FINDINGS: Gallbladder: Within the gallbladder, there are multiple echogenic foci which move and shadow consistent with gallstones. Largest individual gallstone measures 1.4 cm in length. There is no gallbladder wall thickening or pericholecystic fluid. Patient is focally tender over the gallbladder.  Common bile duct: Diameter: 4 mm. There is no  intrahepatic, common hepatic, or common bile duct dilatation.  Liver: No focal lesion identified. Within normal limits in parenchymal echogenicity.  IVC: No abnormality visualized.  Pancreas: Visualized portion unremarkable. Much of the pancreas is obscured by gas.  Spleen: Size and appearance within normal limits.  Right Kidney: Length: 8.9 cm. Echogenicity is increased. There is renal cortical thinning. No mass or hydronephrosis visualized.  Left Kidney: Length: 7.6 cm. Echogenicity is increased. There is borderline renal cortical thinning. No mass or hydronephrosis visualized.  Abdominal aorta: No aneurysm visualized.  Other findings: No demonstrable ascites.  IMPRESSION: Cholelithiasis with focal tenderness over the gallbladder. These are findings concerning for early cholecystitis.  Small kidneys with increased echogenicity and renal cortical thinning. These findings  are concerning for medical renal disease.  Most of pancreas obscured by gas. Visualized portions of pancreas appear normal.  Study otherwise unremarkable.   Electronically Signed   By: Lowella Grip III M.D.   On: 10/06/2014 21:05    Scheduled Meds: . [MAR Hold] calcium acetate  667 mg Oral TID WC  . [MAR Hold] cinacalcet  60 mg Oral Q supper  . doxercalciferol      . [MAR Hold] doxercalciferol  4 mcg Intravenous Q T,Th,Sa-HD  . [MAR Hold] insulin aspart  0-9 Units Subcutaneous TID WC  . [MAR Hold] pantoprazole  40 mg Oral BID AC  . [MAR Hold] sevelamer carbonate  3,200-4,000 mg Oral TID WC  . [MAR Hold] sodium chloride  3 mL Intravenous Q12H   Continuous Infusions: . sodium chloride 10 mL/hr at 10/08/14 1236    Principal Problem:   Abdominal pain Active Problems:   CKD (chronic kidney disease) stage V requiring chronic dialysis   DM (diabetes mellitus) type I controlled with renal manifestation    Time spent: 30 min    Evann Erazo, [facility] Pager [phone]. If 7PM-7AM, please contact night-coverage  at [email or web address], password [facility] 10/08/2014, 3:11 PM  LOS: 2 days

## 2014-10-08 NOTE — Progress Notes (Signed)
[facility] Progress Note  Assessment/Plan: 1. abd pain- US- Gallbladder dyskinesia with RUQ pain. Laparoscopic cholecystectomy today. Primary and surgery following. 2. R hydronephrosis- resolved.  3. ESRD - [facility], reports apt at [facility] Monday 8/15 to have stents placed in access- had fistulogram 8/5 for persistant low AF. Will have HD tomorrow to get back on schedule. 4. Hypertension/volume - BP 114/75-155/67. Had HD this AM. Net UF 3 liters. Post wt. 74.8 KG.  Will attempt 74 kg tomorrow as EDW.  5. Anemia - hgb 10.4 . Follow CBC.  6. Metabolic bone disease - Ca 8.2 C Ca 9.32 Phos 6.2 continue binders as ordered. 7. Nutrition - NPO except ice chips at present. Advance diet as needed.  8. DM- per primary  Rita H. Brown NP-C 10/08/2014, 4:31 PM  [facility] [phone]  Pt seen, examined and agree w A/P as above.  Kelly Splinter MD pager [phone]    cell [phone] 10/09/2014, 12:29 PM    Subjective:   "I'm hurting, I'm hurting so bad".  Just arrived to room from PACU. Husband and daughter at bedside.  Objective Filed Vitals:   10/08/14 1500 10/08/14 1515 10/08/14 1530 10/08/14 1547  BP: 131/55 113/55 110/55 117/57  Pulse:  80 74 74  Temp: 98 F (36.7 C)   97.2 F (36.2 C)  TempSrc:      Resp: 19 16 11    Weight:      SpO2: 99% 93% 100% 100%   Physical Exam General: Awake, C/O extreme pain and cramps in LE.  Heart: S1, S2, RRR. No M/R/G Lungs: Bilateral breath sounds decreased in bases otherwise CTA Abdomen: Abdomen firm, no BS at present. Laparoscopy  incisions C/D/I.  Extremities: no edema LE. Having cramps in feet.  Dialysis Access: LUA  AVG  + Bruit.  Dialysis Orders: TTS [facility] 4hrs 75.5kgs 2k/2ca LAVG 3500u heparin hectorol 4 Additional Objective Labs: Basic Metabolic Panel:  Recent Labs Lab 10/06/14 2107 10/07/14 0640 10/08/14 0430  NA 134* 136 135  K 5.3* 4.3 4.7  CL 96* 98* 99*  CO2 26 23 23   GLUCOSE  379* 142* 189*  BUN 46* 49* 57*  CREATININE 8.73* 9.21* 10.62*  CALCIUM 8.3* 8.0* 8.2*  PHOS  --   --  6.2*   Liver Function Tests:  Recent Labs Lab 10/06/14 2107 10/07/14 0640 10/08/14 0430  AST 16 12* 11*  ALT 14 10* 12*  ALKPHOS 70 52 61  BILITOT 0.4 0.5 0.6  PROT 6.8 5.7* 6.0*  ALBUMIN 3.1* 2.5* 2.6*    Recent Labs Lab 10/06/14 1254 10/06/14 2107  LIPASE 41 40   CBC:  Recent Labs Lab 10/06/14 1308 10/06/14 2107 10/07/14 0640 10/08/14 0430  WBC 14.2* 11.4* 8.6 8.9  HGB 11.8* 11.9* 10.5* 10.4*  HCT 38.7 36.1 31.7* 31.6*  MCV 90.6 91.9 91.6 90.0  PLT  --  345 318 355   Blood Culture    Component Value Date/Time   SDES URINE, RANDOM 09/23/2014 1230   SPECREQUEST NONE 09/23/2014 1230   CULT MULTIPLE SPECIES PRESENT, SUGGEST RECOLLECTION 09/23/2014 1230   REPTSTATUS 09/24/2014 FINAL 09/23/2014 1230    Cardiac Enzymes: No results for input(s): CKTOTAL, CKMB, CKMBINDEX, TROPONINI in the last 168 hours. CBG:  Recent Labs Lab 10/07/14 0810 10/07/14 2111 10/08/14 0755 10/08/14 1129 10/08/14 1503  GLUCAP 119* 289* 114* 112* 146*   Iron Studies: No results for input(s): IRON, TIBC, TRANSFERRIN, FERRITIN in the last 72 hours. @lablastinr3 @ Studies/Results: Nm Hepatobiliary Including Gb  10/07/2014   CLINICAL DATA:  Upper abdominal pain.  Cholelithiasis.  EXAM: NUCLEAR MEDICINE HEPATOBILIARY IMAGING WITH GALLBLADDER EF  TECHNIQUE: Sequential images of the abdomen were obtained out to 60 minutes following intravenous administration of radiopharmaceutical. After slow intravenous infusion of 1.57 micrograms Cholecystokinin, gallbladder ejection fraction was determined.  RADIOPHARMACEUTICALS:  5.0 mCi Tc-49m Choletec IV  COMPARISON:  Ultrasound dated 10/06/2014  FINDINGS: The gallbladder is visualized at 10 min. There is activity in the duodenum at 10 min. At 59 min the ejection fraction was 1.2%. At 45 min, normal ejection fraction is greater than 40%.   IMPRESSION: Abnormal hepatobiliary scan. The gallbladder visualizes normally but there is no contraction with CCK infusion. 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   Electronically Signed   By: Lorriane Shire M.D.   On: 10/07/2014 16:03   US Abdomen Complete  10/06/2014   CLINICAL DATA:  Upper abdominal pain  EXAM: ULTRASOUND ABDOMEN COMPLETE  COMPARISON:  CT abdomen and pelvis September 23, 2014  FINDINGS: Gallbladder: Within the gallbladder, there are multiple echogenic foci which move and shadow consistent with gallstones. Largest individual gallstone measures 1.4 cm in length. There is no gallbladder wall thickening or pericholecystic fluid. Patient is focally tender over the gallbladder.  Common bile duct: Diameter: 4 mm. There is no intrahepatic, common hepatic, or common bile duct dilatation.  Liver: No focal lesion identified. Within normal limits in parenchymal echogenicity.  IVC: No abnormality visualized.  Pancreas: Visualized portion unremarkable. Much of the pancreas is obscured by gas.  Spleen: Size and appearance within normal limits.  Right Kidney: Length: 8.9 cm. Echogenicity is increased. There is renal cortical thinning. No mass or hydronephrosis visualized.  Left Kidney: Length: 7.6 cm. Echogenicity is increased. There is borderline renal cortical thinning. No mass or hydronephrosis visualized.  Abdominal aorta: No aneurysm visualized.  Other findings: No demonstrable ascites.  IMPRESSION: Cholelithiasis with focal tenderness over the gallbladder. These are findings concerning for early cholecystitis.  Small kidneys with increased echogenicity and renal cortical thinning. These findings are concerning for medical renal disease.  Most of pancreas obscured by gas. Visualized portions of pancreas appear normal.  Study otherwise unremarkable.   Electronically Signed   By: Lowella Grip III M.D.   On: 10/06/2014 21:05   Medications: . sodium chloride 10 mL/hr at 10/08/14 1236   .  calcium acetate  667 mg Oral TID WC  . cinacalcet  60 mg Oral Q supper  . doxercalciferol      . doxercalciferol  4 mcg Intravenous Q T,Th,Sa-HD  . fentaNYL      . insulin aspart  0-9 Units Subcutaneous TID WC  . pantoprazole  40 mg Oral BID AC  . sevelamer carbonate  3,200-4,000 mg Oral TID WC  . sodium chloride  3 mL Intravenous Q12H

## 2014-10-08 NOTE — Interval H&P Note (Signed)
History and Physical Interval Note:  10/08/2014 12:58 PM  Morgan [name]  has presented today for surgery, with the diagnosis of Biliary colic/dyskinesia  The various methods of treatment have been discussed with the patient and family. After consideration of risks, benefits and other options for treatment, the patient has consented to  Procedure(s): LAPAROSCOPIC CHOLECYSTECTOMY WITH INTRAOPERATIVE CHOLANGIOGRAM (N/A) as a surgical intervention .  The patient's history has been reviewed, patient examined, no change in status, stable for surgery.  I have reviewed the patient's chart and labs.  Questions were answered to the patient's satisfaction.   The procedure has been discussed with the patient. Operative and non operative treatments have been discussed. Risks of surgery include bleeding, infection,  Common bile duct injury,  Injury to the stomach,liver, colon,small intestine, abdominal wall,  Diaphragm,  Major blood vessels,  And the need for an open procedure.  Other risks include worsening of medical problems, death,  DVT and pulmonary embolism, and cardiovascular events.   Medical options have also been discussed. The patient has been informed of long term expectations of surgery and non surgical options,  The patient agrees to proceed.    [name] A.

## 2014-10-08 NOTE — Op Note (Signed)
Laparoscopic Cholecystectomy  Procedure Note  Indications: This patient presents with symptomatic gallbladder disease and will undergo laparoscopic cholecystectomy. The procedure has been discussed with the patient. Operative and non operative treatments have been discussed. Risks of surgery include bleeding, infection,  Common bile duct injury,  Injury to the stomach,liver, colon,small intestine, abdominal wall,  Diaphragm,  Major blood vessels,  And the need for an open procedure.  Other risks include worsening of medical problems, death,  DVT and pulmonary embolism, and cardiovascular events.   Medical options have also been discussed. The patient has been informed of long term expectations of surgery and non surgical options,  The patient agrees to proceed.    Pre-operative Diagnosis: Calculus of gallbladder without mention of cholecystitis or obstruction  Post-operative Diagnosis: Same  Surgeon: Kree Armato A.   Assistants: Deon Pilling RNFA  Anesthesia: General endotracheal anesthesia and Local anesthesia 0.25.% bupivacaine, with epinephrine  ASA Class: 3  Procedure Details  The patient was seen again in the Holding Room. The risks, benefits, complications, treatment options, and expected outcomes were discussed with the patient. The possibilities of reaction to medication, pulmonary aspiration, perforation of viscus, bleeding, recurrent infection, finding a normal gallbladder, the need for additional procedures, failure to diagnose a condition, the possible need to convert to an open procedure, and creating a complication requiring transfusion or operation were discussed with the patient. The patient and/or family concurred with the proposed plan, giving informed consent. The site of surgery properly noted/marked. The patient was taken to Operating Room, identified as [place] and the procedure verified as Laparoscopic Cholecystectomy with Intraoperative Cholangiograms. A Time  Out was held and the above information confirmed.  Prior to the induction of general anesthesia, antibiotic prophylaxis was administered. General endotracheal anesthesia was then administered and tolerated well. After the induction, the abdomen was prepped in the usual sterile fashion. The patient was positioned in the supine position with the left arm comfortably tucked, along with some reverse Trendelenburg.  Local anesthetic agent was injected into the skin near the umbilicus and an incision made. The midline fascia was incised and the Hasson technique was used to introduce a 12 mm port under direct vision. It was secured with a figure of eight Vicryl suture placed in the usual fashion. Pneumoperitoneum was then created with CO2 and tolerated well without any adverse changes in the patient's vital signs. Additional trocars were introduced under direct vision with an 11 mm trocar in the epigastrium and 2 5 mm trocars in the right upper quadrant. All skin incisions were infiltrated with a local anesthetic agent before making the incision and placing the trocars.   The gallbladder was identified, the fundus grasped and retracted cephalad. Adhesions were lysed bluntly and with the electrocautery where indicated, taking care not to injure any adjacent organs or viscus. The infundibulum was grasped and retracted laterally, exposing the peritoneum overlying the triangle of Calot. This was then divided and exposed in a blunt fashion. The cystic duct was clearly identified and bluntly dissected circumferentially. The junctions of the gallbladder, cystic duct and common bile duct were clearly identified prior to the division of any linear structure.   An incision was made in the cystic duct. Multiple attempts to pass the catheter failed. I decided t forgo cholangiogram at this point given HIDA and normal LFT studies.    The cystic duct was then  ligated with surgical clips  on the patient side and  clipped on  the gallbladder side and divided. The cystic  artery was identified, dissected free, ligated with clips and divided as well. Posterior cystic artery clipped and divided.  The gallbladder was dissected from the liver bed in retrograde fashion with the electrocautery. The gallbladder was removed via the umbilical incision with Endocatch bag. The liver bed was irrigated and inspected. Hemostasis was achieved with the electrocautery. Copious irrigation was utilized and was repeatedly aspirated until clear all particulate matter. Hemostasis was achieved with no signs  Of bleeding or bile leakage.  Pneumoperitoneum was completely reduced after viewing removal of the trocars under direct vision. The wound was thoroughly irrigated and the fascia was then closed with a figure of eight suture; the skin was then closed with 4 0 MONOCRYL and a sterile dressing was applied.  Instrument, sponge, and needle counts were correct at closure and at the conclusion of the case.   Findings:  Cholelithiasis  Estimated Blood Loss: Minimal                Total IV Fluids: 400 mL         Specimens: Gallbladder           Complications: None; patient tolerated the procedure well.         Disposition: PACU - hemodynamically stable.         Condition: stable

## 2014-10-09 DIAGNOSIS — N949 Unspecified condition associated with female genital organs and menstrual cycle: Secondary | ICD-10-CM

## 2014-10-09 DIAGNOSIS — K828 Other specified diseases of gallbladder: Secondary | ICD-10-CM

## 2014-10-09 LAB — COMPREHENSIVE METABOLIC PANEL
ALT: 22 U/L (ref 14–54)
AST: 60 U/L — ABNORMAL HIGH (ref 15–41)
Albumin: 2.7 g/dL — ABNORMAL LOW (ref 3.5–5.0)
Alkaline Phosphatase: 60 U/L (ref 38–126)
Anion gap: 15 (ref 5–15)
BILIRUBIN TOTAL: 0.3 mg/dL (ref 0.3–1.2)
BUN: 38 mg/dL — AB (ref 6–20)
CO2: 25 mmol/L (ref 22–32)
CREATININE: 9.76 mg/dL — AB (ref 0.44–1.00)
Calcium: 8.2 mg/dL — ABNORMAL LOW (ref 8.9–10.3)
Chloride: 95 mmol/L — ABNORMAL LOW (ref 101–111)
GFR calc Af Amer: 5 mL/min — ABNORMAL LOW (ref >60)
GFR, EST NON AFRICAN AMERICAN: 4 mL/min — AB (ref >60)
Glucose, Bld: 183 mg/dL — ABNORMAL HIGH (ref 65–99)
Potassium: 4.6 mmol/L (ref 3.5–5.1)
Sodium: 135 mmol/L (ref 135–145)
Total Protein: 6.3 g/dL — ABNORMAL LOW (ref 6.5–8.1)

## 2014-10-09 LAB — CBC
HCT: 34.1 % — ABNORMAL LOW (ref 36.0–46.0)
Hemoglobin: 11.1 g/dL — ABNORMAL LOW (ref 12.0–15.0)
MCH: 30.1 pg (ref 26.0–34.0)
MCHC: 32.6 g/dL (ref 30.0–36.0)
MCV: 92.4 fL (ref 78.0–100.0)
PLATELETS: 331 10*3/uL (ref 150–400)
RBC: 3.69 MIL/uL — ABNORMAL LOW (ref 3.87–5.11)
RDW: 14.5 % (ref 11.5–15.5)
WBC: 8.6 10*3/uL (ref 4.0–10.5)

## 2014-10-09 LAB — GLUCOSE, CAPILLARY
GLUCOSE-CAPILLARY: 237 mg/dL — AB (ref 65–99)
GLUCOSE-CAPILLARY: 177 mg/dL — AB (ref 65–99)
Glucose-Capillary: 228 mg/dL — ABNORMAL HIGH (ref 65–99)

## 2014-10-09 MED ORDER — ALTEPLASE 2 MG IJ SOLR
2.0000 mg | Freq: Once | INTRAMUSCULAR | Status: DC | PRN
  Filled 2014-10-09: qty 2

## 2014-10-09 MED ORDER — SODIUM CHLORIDE 0.9 % IV SOLN: 100.0000 mL | INTRAVENOUS | Status: DC | PRN

## 2014-10-09 MED ORDER — PENTAFLUOROPROP-TETRAFLUOROETH EX AERO: 1.0000 "application " | INHALATION_SPRAY | CUTANEOUS | Status: DC | PRN

## 2014-10-09 MED ORDER — HEPARIN SODIUM (PORCINE) 1000 UNIT/ML DIALYSIS: 1000.0000 [IU] | INTRAMUSCULAR | Status: DC | PRN

## 2014-10-09 MED ORDER — LIDOCAINE-PRILOCAINE 2.5-2.5 % EX CREA: 1.0000 "application " | TOPICAL_CREAM | CUTANEOUS | Status: DC | PRN

## 2014-10-09 MED ORDER — DOXERCALCIFEROL 4 MCG/2ML IV SOLN
INTRAVENOUS | Status: AC
  Filled 2014-10-09: qty 2

## 2014-10-09 MED ORDER — OXYCODONE-ACETAMINOPHEN 5-325 MG PO TABS: 1.0000 | ORAL_TABLET | Freq: Four times a day (QID) | ORAL | Status: DC | PRN

## 2014-10-09 MED ORDER — LIDOCAINE HCL (PF) 1 % IJ SOLN: 5.0000 mL | INTRAMUSCULAR | Status: DC | PRN

## 2014-10-09 MED ORDER — NEPRO/CARBSTEADY PO LIQD: 237.0000 mL | ORAL | Status: DC | PRN

## 2014-10-09 NOTE — Progress Notes (Signed)
[facility] Progress Note  Assessment/Plan: 1. Abd pain- US- Gallbladder dyskinesia with RUQ pain. Laparoscopic cholecystectomy POD1. Eating and drinking, No C/Os. Primary and surgery following. 2. R hydronephrosis- resolved.  3. ESRD - [facility], reports apt at [place] Monday 8/15 to have stents placed in access- had fistulogram 8/5 for persistant low AF. Will have HD today to get back on schedule. 4. Hypertension/volume - HD today. Will attempt 74 kg. BP controlled.  5. Anemia - hgb 10.4 . Follow CBC.  6. Metabolic bone disease - Ca 8.2 C Ca 9.32 Phos 6.2 continue binders as ordered. 7. Nutrition - Renal diet. Tolerating PO intake well.  8. DM- per primary  Disposition:  Home today after HD.   Rita H. Brown NP-C 10/09/2014, 10:02 AM  [facility] [phone]  Pt seen, examined and agree w A/P as above.  Kelly Splinter MD pager [phone]    cell [phone] 10/09/2014, 12:28 PM    Subjective:   "I feel so much better".  Sitting up in up bed. No c/os abd. Pain.   Objective Filed Vitals:   10/08/14 1547 10/08/14 2100 10/09/14 0500 10/09/14 0727  BP: 142/59 115/51 95/45 95/52   Pulse: 74 81 80 85  Temp: 97.2 F (36.2 C) 98.2 F (36.8 C) 98.1 F (36.7 C) 98 F (36.7 C)  TempSrc:  Oral Oral Oral  Resp:  16 16 17   Weight:  78.1 kg (172 lb 2.9 oz)    SpO2: 100% 99% 100% 99%   Physical Exam General: Awake, pleasant and cooperative. NAD  Heart: S1, S2, RRR. No M/R/G Lungs: Bilateral breath sounds  CTA Abdomen: Abdomen soft, hypoactive BS.  Laparoscopy incisions C/D/I.  Extremities: no edema LE.  Dialysis Access: LUA AVG + Bruit.  Dialysis Orders: [facility] 4hrs 75.5kgs 2k/2ca LAVG 3500u heparin hectorol 4 Additional Objective Labs: Basic Metabolic Panel:  Recent Labs Lab 10/06/14 2107 10/07/14 0640 10/08/14 0430  NA 134* 136 135  K 5.3* 4.3 4.7  CL 96* 98* 99*  CO2 26 23 23   GLUCOSE 379* 142* 189*  BUN 46* 49* 57*   CREATININE 8.73* 9.21* 10.62*  CALCIUM 8.3* 8.0* 8.2*  PHOS  --   --  6.2*   Liver Function Tests:  Recent Labs Lab 10/06/14 2107 10/07/14 0640 10/08/14 0430  AST 16 12* 11*  ALT 14 10* 12*  ALKPHOS 70 52 61  BILITOT 0.4 0.5 0.6  PROT 6.8 5.7* 6.0*  ALBUMIN 3.1* 2.5* 2.6*    Recent Labs Lab 10/06/14 1254 10/06/14 2107  LIPASE 41 40   CBC:  Recent Labs Lab 10/06/14 1308 10/06/14 2107 10/07/14 0640 10/08/14 0430  WBC 14.2* 11.4* 8.6 8.9  HGB 11.8* 11.9* 10.5* 10.4*  HCT 38.7 36.1 31.7* 31.6*  MCV 90.6 91.9 91.6 90.0  PLT  --  345 318 355   Blood Culture    Component Value Date/Time   SDES URINE, RANDOM 09/23/2014 1230   SPECREQUEST NONE 09/23/2014 1230   CULT MULTIPLE SPECIES PRESENT, SUGGEST RECOLLECTION 09/23/2014 1230   REPTSTATUS 09/24/2014 FINAL 09/23/2014 1230    Cardiac Enzymes: No results for input(s): CKTOTAL, CKMB, CKMBINDEX, TROPONINI in the last 168 hours. CBG:  Recent Labs Lab 10/08/14 1129 10/08/14 1503 10/08/14 1601 10/08/14 2146 10/09/14 0725  GLUCAP 112* 146* 153* 181* 228*   Iron Studies: No results for input(s): IRON, TIBC, TRANSFERRIN, FERRITIN in the last 72 hours. @lablastinr3 @ Studies/Results: Nm Hepatobiliary Including Gb  10/07/2014   CLINICAL DATA:  Upper abdominal pain.  Cholelithiasis.  EXAM: NUCLEAR MEDICINE HEPATOBILIARY IMAGING WITH GALLBLADDER EF  TECHNIQUE: Sequential images of the abdomen were obtained out to 60 minutes following intravenous administration of radiopharmaceutical. After slow intravenous infusion of 1.57 micrograms Cholecystokinin, gallbladder ejection fraction was determined.  RADIOPHARMACEUTICALS:  5.0 mCi Tc-72m Choletec IV  COMPARISON:  Ultrasound dated 10/06/2014  FINDINGS: The gallbladder is visualized at 10 min. There is activity in the duodenum at 10 min. At 59 min the ejection fraction was 1.2%. At 45 min, normal ejection fraction is greater than 40%.  IMPRESSION: Abnormal hepatobiliary scan.  The gallbladder visualizes normally but there is no contraction with CCK infusion. 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   Electronically Signed   By: Lorriane Shire M.D.   On: 10/07/2014 16:03   Medications: . sodium chloride 10 mL/hr at 10/08/14 1236   . calcium acetate  667 mg Oral TID WC  . cinacalcet  60 mg Oral Q supper  . doxercalciferol  4 mcg Intravenous Q T,Th,Sa-HD  . insulin aspart  0-9 Units Subcutaneous TID WC  . pantoprazole  40 mg Oral BID AC  . sevelamer carbonate  3,200-4,000 mg Oral TID WC  . sodium chloride  3 mL Intravenous Q12H

## 2014-10-09 NOTE — Progress Notes (Signed)
1 Day Post-Op  Subjective: Pt feels well  Objective: Vital signs in last 24 hours: Temp:  [97.2 F (36.2 C)-98.2 F (36.8 C)] 98 F (36.7 C) (08/13 0727) Pulse Rate:  [74-85] 85 (08/13 0727) Resp:  [11-19] 17 (08/13 0727) BP: (95-142)/(45-59) 95/52 mmHg (08/13 0727) SpO2:  [93 %-100 %] 99 % (08/13 0727) Weight:  [78.1 kg (172 lb 2.9 oz)] 78.1 kg (172 lb 2.9 oz) (08/12 2100) Last BM Date: 10/08/14  Intake/Output from previous day: 08/12 0701 - 08/13 0700 In: 690 [P.O.:340; I.V.:350] Out: 0  Intake/Output this shift: Total I/O In: 240 [P.O.:240] Out: -   Incision/Wound:CDI soft   Lab Results:   Recent Labs  10/07/14 0640 10/08/14 0430  WBC 8.6 8.9  HGB 10.5* 10.4*  HCT 31.7* 31.6*  PLT 318 355   BMET  Recent Labs  10/07/14 0640 10/08/14 0430  NA 136 135  K 4.3 4.7  CL 98* 99*  CO2 23 23  GLUCOSE 142* 189*  BUN 49* 57*  CREATININE 9.21* 10.62*  CALCIUM 8.0* 8.2*   PT/INR No results for input(s): LABPROT, INR in the last 72 hours. ABG No results for input(s): PHART, HCO3 in the last 72 hours.  Invalid input(s): PCO2, PO2  Studies/Results: Nm Hepatobiliary Including Gb  10/07/2014   CLINICAL DATA:  Upper abdominal pain.  Cholelithiasis.  EXAM: NUCLEAR MEDICINE HEPATOBILIARY IMAGING WITH GALLBLADDER EF  TECHNIQUE: Sequential images of the abdomen were obtained out to 60 minutes following intravenous administration of radiopharmaceutical. After slow intravenous infusion of 1.57 micrograms Cholecystokinin, gallbladder ejection fraction was determined.  RADIOPHARMACEUTICALS:  5.0 mCi Tc-2m Choletec IV  COMPARISON:  Ultrasound dated 10/06/2014  FINDINGS: The gallbladder is visualized at 10 min. There is activity in the duodenum at 10 min. At 59 min the ejection fraction was 1.2%. At 45 min, normal ejection fraction is greater than 40%.  IMPRESSION: Abnormal hepatobiliary scan. The gallbladder visualizes normally but there is no contraction with CCK infusion. The  finding is consistent with gallbladder dyskinesia.   Electronically Signed   By: Lorriane Shire M.D.   On: 10/07/2014 16:03    Anti-infectives: Anti-infectives    Start     Dose/Rate Route Frequency Ordered Stop   10/07/14 1000  cefTRIAXone (ROCEPHIN) 2 g in dextrose 5 % 50 mL IVPB  Status:  Discontinued    Comments:  Pharmacy may adjust dosing strength / duration / interval for maximal efficacy   2 g 100 mL/hr over 30 Minutes Intravenous Every 24 hours 10/07/14 0945 10/07/14 1642   10/07/14 0800  piperacillin-tazobactam (ZOSYN) IVPB 2.25 g  Status:  Discontinued     2.25 g 100 mL/hr over 30 Minutes Intravenous Every 8 hours 10/07/14 0741 10/07/14 0945      Assessment/Plan: s/p Procedure(s): LAPAROSCOPIC CHOLECYSTECTOMY  (N/A) Doing well  Ok to discharge Follow up 2 weeks for DOW clinic.   LOS: 3 days    Zadin Lange A. 10/09/2014

## 2014-10-09 NOTE — Discharge Instructions (Signed)
[facility] ______[facility] °LAPAROSCOPIC SURGERY: POST OP INSTRUCTIONS °Always review your discharge instruction sheet given to you by the facility where your surgery was performed. °IF YOU HAVE DISABILITY OR FAMILY LEAVE FORMS, YOU MUST BRING THEM TO THE OFFICE FOR PROCESSING.   °DO NOT GIVE THEM TO YOUR DOCTOR. ° °1. A prescription for pain medication may be given to you upon discharge.  Take your pain medication as prescribed, if needed.  If narcotic pain medicine is not needed, then you may take acetaminophen (Tylenol) or ibuprofen (Advil) as needed. °2. Take your usually prescribed medications unless otherwise directed. °3. If you need a refill on your pain medication, please contact your pharmacy.  They will contact our office to request authorization. Prescriptions will not be filled after 5pm or on week-ends. °4. You should follow a light diet the first few days after arrival home, such as soup and crackers, etc.  Be sure to include lots of fluids daily. °5. Most patients will experience some swelling and bruising in the area of the incisions.  Ice packs will help.  Swelling and bruising can take several days to resolve.  °6. It is common to experience some constipation if taking pain medication after surgery.  Increasing fluid intake and taking a stool softener (such as Colace) will usually help or prevent this problem from occurring.  A mild laxative (Milk of Magnesia or Miralax) should be taken according to package instructions if there are no bowel movements after 48 hours. °7. Unless discharge instructions indicate otherwise, you may remove your bandages 24-48 hours after surgery, and you may shower at that time.  You may have steri-strips (small skin tapes) in place directly over the incision.  These strips should be left on the skin for 7-10 days.  If your surgeon used skin glue on the incision, you may shower in 24 hours.  The glue will flake off over the next 2-3 weeks.  Any sutures or  staples will be removed at the office during your follow-up visit. °8. ACTIVITIES:  You may resume regular (light) daily activities beginning the next day--such as daily self-care, walking, climbing stairs--gradually increasing activities as tolerated.  You may have sexual intercourse when it is comfortable.  Refrain from any heavy lifting or straining until approved by your doctor. °a. You may drive when you are no longer taking prescription pain medication, you can comfortably wear a seatbelt, and you can safely maneuver your car and apply brakes. °b. RETURN TO WORK:  __________________________________________________________ °9. You should see your doctor in the office for a follow-up appointment approximately 2-3 weeks after your surgery.  Make sure that you call for this appointment within a day or two after you arrive home to insure a convenient appointment time. °10. OTHER INSTRUCTIONS: __________________________________________________________________________________________________________________________ __________________________________________________________________________________________________________________________ °WHEN TO CALL YOUR DOCTOR: °1. Fever over 101.0 °2. Inability to urinate °3. Continued bleeding from incision. °4. Increased pain, redness, or drainage from the incision. °5. Increasing abdominal pain ° °The clinic staff is available to answer your questions during regular business hours.  Please don’t hesitate to call and ask to speak to one of the nurses for clinical concerns.  If you have a medical emergency, go to the nearest emergency room or call 911.  A surgeon from [facility] is always on call at the hospital. °[address], [address], [place], [place]  [address] ? [address] [address], [place], [place]   [address] °[phone] ? [phone] ? FAX [phone] °Web site:   [email or web address] °

## 2014-10-09 NOTE — Discharge Summary (Signed)
Physician Discharge Summary  Morgan [name] [id number] DOB: 09/12/1970 DOA: 10/06/2014  PCP: Morgan Napoleon, NP  Admit date: 10/06/2014 Discharge date: 10/09/2014  Recommendations for Outpatient Follow-up:  1. F/u with Dr. Brantley Stage in clinic in 2 weeks 2. Continue dialysis on [facility] schedule as before 3. F/u with vascular surgery on Monday 8/15 to have stents placed in her access  4. 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 She had had similar pain off and on for months and had recently been hospitalized and found to have right hydronephrosis.  She was supposed to follow up with urology as an outpatient for stent placement.  She was not very cooperative with interview and was she upset she needed to admitted.  She was concerned her hospitalization was going to cause her to miss some important outpatient appointments.  General surgery saw the patient and requested HIDA scan.    Hospital Course:   Gallbladder dyskinesia with RUQ pain.  HIDA scan which demonstrated normal gallbladder filling (NO acute cholecystitis), however, she had no contraction with CCK infusion. Her previous right hydronephrosis had resolved on her recent ultrasound, therefore, her pain was not related to hydronephrosis.  She was informated that she did not need to follow up with urology.  Her aspirin and plavix were held and she  underwent Laparoscopic cholecystectomy on 8/12.  Post surgery she was able to tolerate a renal diet and she was spontaneously passing flatus.  She will need to follow-up with general surgery in 2 weeks for reevaluation.  ESRD, she received hemodialysis on a Tuesday, Thursday, Saturday schedule.  DM, type 2 with renal manifestations, CBG well controlled. She was advised to continue her home diabetes regimen.  Left Adnexal mass - CT from a month ago demonstrated a 4.6 cm cystic and solid appearing left adnexal mass, suspicious for cystic ovarian neoplasm and pelvic ultrasound demonstrated cystic masses arising from the left ovary as well as a complex cystic mass measuring [id number] cm, representing simple hemorrhagic cyst versus cystadenoma.  - Repeat US in 1 month  - F/u with GYN at already scheduled appointments   Consultants:  Nephrology  General surgery  Urology  Procedures:  Abdominal ultrasound  HIDA scan  Antibiotics:  Ceftriaxone once on 8/11  Discharge Exam: Filed Vitals:   10/09/14 0727  BP: 95/52  Pulse: 85  Temp: 98 F (36.7 C)  Resp: 17   Filed Vitals:   10/08/14 1547 10/08/14 2100 10/09/14 0500 10/09/14 0727  BP: 142/59 115/51 95/45 95/52   Pulse: 74 81 80 85  Temp: 97.2 F (36.2 C) 98.2 F (36.8 C) 98.1 F (36.7 C) 98 F (36.7 C)  TempSrc:  Oral Oral Oral  Resp:  16 16 17   Weight:  78.1 kg (172 lb 2.9 oz)    SpO2: 100% 99% 100% 99%     General: Adult female, No acute distress  HEENT: NCAT, MMM  Cardiovascular: RRR  Respiratory: CTAB, no increased WOB  Abdomen: NABS, soft, mildly distended, minimally tender to palpation diffusely without rebound or guarding. Her for abdominal port sites are without bruising, induration, erythema, purulence. Edges are well approximated with Dermabond intact.   MSK: Normal tone and bulk, no LEE  Neuro: Grossly intact  Discharge Instructions      Discharge Instructions    Call MD for:   difficulty breathing, headache or visual disturbances    Complete by:  As directed      Call MD for:  extreme fatigue    Complete by:  As directed      Call MD for:  hives    Complete by:  As directed      Call MD for:  persistant dizziness or light-headedness    Complete by:  As directed      Call MD for:  persistant nausea and vomiting    Complete by:  As directed      Call MD for:  redness, tenderness, or signs of infection (pain, swelling, redness, odor or green/yellow discharge around incision site)    Complete by:  As directed      Call MD for:  severe uncontrolled pain    Complete by:  As directed      Call MD for:  temperature >100.4    Complete by:  As directed      Diet - low sodium heart healthy    Complete by:  As directed      Increase activity slowly    Complete by:  As directed             Medication List    STOP taking these medications        ciprofloxacin 250 MG tablet  Commonly known as:  CIPRO      TAKE these medications        albuterol 108 (90 BASE) MCG/ACT inhaler  Commonly known as:  PROVENTIL HFA;VENTOLIN HFA  Inhale 1-2 puffs into the lungs every 6 (six) hours as needed for wheezing or shortness of breath.     aspirin 81 MG tablet  Take 81 mg by mouth daily.     calcium acetate 667 MG capsule  Commonly known as:  PHOSLO  Take 667 mg by mouth 3 (three) times daily with meals.     cinacalcet 60 MG tablet  Commonly known as:  SENSIPAR  Take 60 mg by mouth daily.     clopidogrel 75 MG tablet  Commonly known as:  PLAVIX  Take 75 mg by mouth daily.     DIALYVITE TABLET Tabs  Take 1 tablet by mouth daily at 12 noon.     glucose blood test strip  Commonly known as:  ONE TOUCH ULTRA TEST  1 each by Other route 2 (two) times daily. And lancets 2/day     insulin lispro 100 UNIT/ML KiwkPen  Commonly known as:  HUMALOG KWIKPEN  Inject 0.1 mLs (10 Units total) into the skin 3 (three) times daily with meals. And pen needles 3/day     Insulin  Pen Needle 31G X 5 MM Misc  Commonly known as:  DROPLET PEN NEEDLES  Use to inject insulin 3 times per day     levonorgestrel 20 MCG/24HR IUD  Commonly known as:  MIRENA  1 each by Intrauterine route once.     lidocaine-prilocaine cream  Commonly known as:  EMLA  Apply 1 application topically daily as needed (  dialysis).     oxyCODONE-acetaminophen 5-325 MG per tablet  Commonly known as:  PERCOCET/ROXICET  Take 1 tablet by mouth every 6 (six) hours as needed for severe pain.     pioglitazone 15 MG tablet  Commonly known as:  ACTOS  Take 15 mg by mouth daily.     sevelamer carbonate 800 MG tablet  Commonly known as:  RENVELA  Take 4,000-4,800 mg by mouth 3 (three) times daily with meals.       Follow-up Information    Follow up with [facility] On 11/02/2014.   Specialty:  General Surgery   Why:  [facility], 1:45pm, arrive no later than 1:15pm for paperwork   Contact information:   [address] [place] [address] [phone]       Follow up with MABE,DAVID, NP. Schedule an appointment as soon as possible for a visit in 2 weeks.   Specialty:  Family Medicine   Why:  As needed   Contact information:   [address] [place] [address] [phone]       Follow up with [facility] On 10/11/2014.      Follow up with Aloha Gell A., MD. Call in 1 month.   Specialty:  Obstetrics and Gynecology   Contact information:   [place] [address] [phone]        The results of significant diagnostics from this hospitalization (including imaging, microbiology, ancillary and laboratory) are listed below for reference.    Significant Diagnostic Studies: Nm Hepatobiliary Including Gb  10/07/2014   CLINICAL DATA:  Upper abdominal pain.  Cholelithiasis.  EXAM: NUCLEAR MEDICINE HEPATOBILIARY IMAGING WITH GALLBLADDER EF  TECHNIQUE: Sequential images of the abdomen were obtained out to 60  minutes following intravenous administration of radiopharmaceutical. After slow intravenous infusion of 1.57 micrograms Cholecystokinin, gallbladder ejection fraction was determined.  RADIOPHARMACEUTICALS:  5.0 mCi Tc-27m Choletec IV  COMPARISON:  Ultrasound dated 10/06/2014  FINDINGS: The gallbladder is visualized at 10 min. There is activity in the duodenum at 10 min. At 59 min the ejection fraction was 1.2%. At 45 min, normal ejection fraction is greater than 40%.  IMPRESSION: Abnormal hepatobiliary scan. The gallbladder visualizes normally but there is no contraction with CCK infusion. 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   Electronically Signed   By: Lorriane Shire M.D.   On: 10/07/2014 16:03   US Abdomen Complete  10/06/2014   CLINICAL DATA:  Upper abdominal pain  EXAM: ULTRASOUND ABDOMEN COMPLETE  COMPARISON:  CT abdomen and pelvis September 23, 2014  FINDINGS: Gallbladder: Within the gallbladder, there are multiple echogenic foci which move and shadow consistent with gallstones. Largest individual gallstone measures 1.4 cm in length. There is no gallbladder wall thickening or pericholecystic fluid. Patient is focally tender over the gallbladder.  Common bile duct: Diameter: 4 mm. There is no intrahepatic, common hepatic, or common bile duct dilatation.  Liver: No focal lesion identified. Within normal limits in parenchymal echogenicity.  IVC: No abnormality visualized.  Pancreas: Visualized portion unremarkable. Much of the pancreas is obscured by gas.  Spleen: Size and appearance within normal limits.  Right Kidney: Length: 8.9 cm. Echogenicity is increased. There is renal cortical thinning. No mass or hydronephrosis visualized.  Left Kidney: Length: 7.6 cm. Echogenicity is increased. There is borderline renal cortical thinning. No mass or hydronephrosis visualized.  Abdominal aorta: No aneurysm visualized.  Other findings: No demonstrable ascites.  IMPRESSION: Cholelithiasis with focal  tenderness over the gallbladder. These are findings concerning for early cholecystitis.  Small kidneys with increased echogenicity and renal cortical thinning. These findings are concerning for medical renal disease.  Most of pancreas obscured by gas. Visualized portions of pancreas appear normal.  Study otherwise unremarkable.   Electronically Signed   By: Lowella Grip III M.D.   On: 10/06/2014 21:05   US Transvaginal Non-ob  09/24/2014   CLINICAL DATA:  Complex left adnexal mass seen on recent CT  EXAM: TRANSABDOMINAL AND TRANSVAGINAL ULTRASOUND OF PELVIS  TECHNIQUE: Study was performed transabdominally to optimize pelvic field of view evaluation and transvaginally to optimize internal visceral architecture evaluation.  COMPARISON:  CT abdomen and pelvis September 23, 2014  FINDINGS: Uterus  Measurements: 8.8 x 3.9 x 4.3 cm. No fibroids or other mass visualized.  Endometrium  Thickness: 11 mm. There is no intrauterine mass. Intrauterine device is positioned within the endometrium.  Right ovary  Measurements: 2.2 x 2.0 x 2.0 cm. Normal appearance/no adnexal mass.  Left ovary  Measurements: 4.5 x 2.7 x 4.6 cm. There is a cystic mass arising from the left ovary measuring 3.6 x 2.6 x 2.6 cm. There is a hypoechoic mass also arising from the left ovary measuring 2.2 x 2.2 x 1.9 cm. There is also a probable follicle measuring 1.8 x 1.5 x 1.8 cm.  Other findings  No free fluid.  IMPRESSION: There is an intrauterine device positioned within the endometrium. Uterus otherwise appears unremarkable.  There are cystic masses arising from left ovary as well as a complex cystic mass measuring 2.2 x 2.2 x 1.9 cm. While these cystic masses likely represent simple and hemorrhagic cysts, the possibility of cystadenoma must be of concern. Adrina Armijo-interval follow up ultrasound in 6-12 weeks is recommended, preferably during the week following the patient's normal menses.   Electronically Signed   By: Lowella Grip III M.D.   On:  09/24/2014 07:58   US Pelvis Complete  09/24/2014   CLINICAL DATA:  Complex left adnexal mass seen on recent CT  EXAM: TRANSABDOMINAL AND TRANSVAGINAL ULTRASOUND OF PELVIS  TECHNIQUE: Study was performed transabdominally to optimize pelvic field of view evaluation and transvaginally to optimize internal visceral architecture evaluation.  COMPARISON:  CT abdomen and pelvis September 23, 2014  FINDINGS: Uterus  Measurements: 8.8 x 3.9 x 4.3 cm. No fibroids or other mass visualized.  Endometrium  Thickness: 11 mm. There is no intrauterine mass. Intrauterine device is positioned within the endometrium.  Right ovary  Measurements: 2.2 x 2.0 x 2.0 cm. Normal appearance/no adnexal mass.  Left ovary  Measurements: 4.5 x 2.7 x 4.6 cm. There is a cystic mass arising from the left ovary measuring 3.6 x 2.6 x 2.6 cm. There is a hypoechoic mass also arising from the left ovary measuring 2.2 x 2.2 x 1.9 cm. There is also a probable follicle measuring 1.8 x 1.5 x 1.8 cm.  Other findings  No free fluid.  IMPRESSION: There is an intrauterine device positioned within the endometrium. Uterus otherwise appears unremarkable.  There are cystic masses arising from left ovary as well as a complex cystic mass measuring 2.2 x 2.2 x 1.9 cm. While these cystic masses likely represent simple and hemorrhagic cysts, the possibility of cystadenoma must be of concern. Fay Bagg-interval follow up ultrasound in 6-12 weeks is recommended, preferably during the week following the patient's normal menses.   Electronically Signed   By: Lowella Grip III M.D.   On: 09/24/2014 07:58   Ct Abdomen  Pelvis W Contrast  09/23/2014   CLINICAL DATA:  Right upper quadrant and right flank pain and tenderness.  EXAM: CT ABDOMEN AND PELVIS WITH CONTRAST  TECHNIQUE: Multidetector CT imaging of the abdomen and pelvis was performed using the standard protocol following bolus administration of intravenous contrast.  CONTRAST:  80 mL Omnipaque 300  COMPARISON:   Noncontrast CT on 05/19/2006  FINDINGS: Lower Chest: No acute findings.  Hepatobiliary: No masses or other significant abnormality identified. Gallbladder is unremarkable.  Pancreas: No mass, inflammatory changes, or other significant abnormality identified.  Spleen:  Within normal limits in size and appearance.  Adrenals:  No masses identified.  Kidneys/Urinary Tract: Progressive diffuse bilateral renal parenchymal atrophy is seen compared to previous study. Renal vascular calcification noted. No definite renal calculi identified.  Moderate right hydronephrosis and ureterectasis is seen which is new since previous study, however no obstructing calculus visualized. The urinary bladder is nearly completely empty and not well visualized.  Stomach/Bowel/Peritoneum: No evidence of wall thickening, mass, or obstruction. Normal appendix visualized.  Vascular/Lymphatic: No pathologically enlarged lymph nodes identified. No abdominal aortic aneurysm or other significant retroperitoneal abnormality demonstrated.  Reproductive: IUD seen in appropriate position. A complex cystic lesion is seen in the left adnexa which measures 2.8 x 4.6 cm and has a mural nodular density measuring 2.4 cm. This is new since previous study and suspicious for cystic ovarian neoplasm. No evidence of free fluid.  Other:  None.  Musculoskeletal:  No suspicious bone lesions identified.  IMPRESSION: Moderate right hydronephrosis and ureterectasis. No ureteral calculus or other obstructing etiology visualized radiographically. Consider urology referral and retrograde ureteroscopy for further evaluation.  Diffuse bilateral renal parenchymal atrophy which shows significant progression since 2008 exam.  4.6 cm cystic and solid appearing left adnexal mass, suspicious for cystic ovarian neoplasm. Recommend pelvic ultrasound for further evaluation.  IUD in appropriate position.   Electronically Signed   By: Earle Gell M.D.   On: 09/23/2014 14:09   Dg Abd  Acute W/chest  10/06/2014   CLINICAL DATA:  Indigestion for last 4 hours, nausea without vomiting, epigastric pain, belching, heartburn, end-stage renal disease post dialysis graft placement 2 days ago, diabetes mellitus, hypertension  EXAM: DG ABDOMEN ACUTE W/ 1V CHEST  COMPARISON:  09/22/2014  FINDINGS: Normal heart size, mediastinal contours, and pulmonary vascularity.  Lungs clear.  No pleural effusion or pneumothorax.  Vascular grafts in the LEFT upper arm and at the LEFT axillary region.  Nonobstructive bowel gas pattern.  IUD projects over pelvis.  No bowel dilatation or bowel wall thickening, or free intraperitoneal air.  Small LEFT pelvic phleboliths stable.  No urinary tract calcification or acute osseous findings.  IMPRESSION: No acute abnormalities.   Electronically Signed   By: Lavonia Dana M.D.   On: 10/06/2014 15:01   Dg Abd Acute W/chest  09/22/2014   CLINICAL DATA:  Central and right-sided abdominal pain since last night with radiation to back. Dialysis patient. History of hypertension and diabetes.  EXAM: DG ABDOMEN ACUTE W/ 1V CHEST  COMPARISON:  None.  FINDINGS: Single view of the chest: Cardiomediastinal silhouette is within normal limits in size and configuration. Lungs are clear. No evidence of pneumonia. No pleural effusion. No pneumothorax. Axillary/subclavian vessels stents noted on the left. No osseous abnormality.  Decubitus and supine views of the abdomen: Bowel gas pattern is nonobstructive. No evidence of free intraperitoneal air. No evidence of soft tissue mass or abnormal fluid collection. No pathologic -appearing calcifications. Intrauterine device noted within the central  pelvis. No osseous abnormality.  IMPRESSION: No evidence of acute cardiopulmonary abnormality.  Lungs are clear.  Nonobstructive bowel gas pattern and no evidence of acute intra-abdominal abnormality.   Electronically Signed   By: Franki Cabot M.D.   On: 09/22/2014 14:32    Microbiology: Recent Results  (from the past 240 hour(s))  Urine culture     Status: None   Collection Time: 10/06/14  2:30 PM  Result Value Ref Range Status   Colony Count NO GROWTH  Final   Organism ID, Bacteria NO GROWTH  Final     Labs: Basic Metabolic Panel:  Recent Labs Lab 10/06/14 2107 10/07/14 0640 10/08/14 0430  NA 134* 136 135  K 5.3* 4.3 4.7  CL 96* 98* 99*  CO2 26 23 23   GLUCOSE 379* 142* 189*  BUN 46* 49* 57*  CREATININE 8.73* 9.21* 10.62*  CALCIUM 8.3* 8.0* 8.2*  MG  --   --  2.1  PHOS  --   --  6.2*   Liver Function Tests:  Recent Labs Lab 10/06/14 1254 10/06/14 2107 10/07/14 0640 10/08/14 0430  AST 11 16 12* 11*  ALT 11 14 10* 12*  ALKPHOS 73 70 52 61  BILITOT 0.5 0.4 0.5 0.6  PROT 7.3 6.8 5.7* 6.0*  ALBUMIN 3.9 3.1* 2.5* 2.6*    Recent Labs Lab 10/06/14 1254 10/06/14 2107  LIPASE 41 40   No results for input(s): AMMONIA in the last 168 hours. CBC:  Recent Labs Lab 10/06/14 1308 10/06/14 2107 10/07/14 0640 10/08/14 0430  WBC 14.2* 11.4* 8.6 8.9  HGB 11.8* 11.9* 10.5* 10.4*  HCT 38.7 36.1 31.7* 31.6*  MCV 90.6 91.9 91.6 90.0  PLT  --  345 318 355   Cardiac Enzymes: No results for input(s): CKTOTAL, CKMB, CKMBINDEX, TROPONINI in the last 168 hours. BNP: BNP (last 3 results) No results for input(s): BNP in the last 8760 hours.  ProBNP (last 3 results) No results for input(s): PROBNP in the last 8760 hours.  CBG:  Recent Labs Lab 10/08/14 1503 10/08/14 1601 10/08/14 2146 10/09/14 0725 10/09/14 1138  GLUCAP 146* 153* 181* 228* 237*    Time coordinating discharge: 35 minutes  Signed:  Hoyte Ziebell  [facility] 10/09/2014, 3:31 PM

## 2014-10-11 ENCOUNTER — Encounter (HOSPITAL_COMMUNITY): Payer: Self-pay | Admitting: Surgery

## 2014-10-11 DIAGNOSIS — M79609 Pain in unspecified limb: Secondary | ICD-10-CM | POA: Diagnosis not present

## 2014-10-11 DIAGNOSIS — I771 Stricture of artery: Secondary | ICD-10-CM | POA: Diagnosis not present

## 2014-10-11 DIAGNOSIS — I739 Peripheral vascular disease, unspecified: Secondary | ICD-10-CM | POA: Diagnosis not present

## 2014-10-11 DIAGNOSIS — Z794 Long term (current) use of insulin: Secondary | ICD-10-CM | POA: Diagnosis not present

## 2014-10-11 DIAGNOSIS — T82858A Stenosis of vascular prosthetic devices, implants and grafts, initial encounter: Secondary | ICD-10-CM | POA: Diagnosis not present

## 2014-10-11 DIAGNOSIS — N186 End stage renal disease: Secondary | ICD-10-CM | POA: Diagnosis not present

## 2014-10-11 DIAGNOSIS — I120 Hypertensive chronic kidney disease with stage 5 chronic kidney disease or end stage renal disease: Secondary | ICD-10-CM | POA: Diagnosis not present

## 2014-10-11 DIAGNOSIS — Z7902 Long term (current) use of antithrombotics/antiplatelets: Secondary | ICD-10-CM | POA: Diagnosis not present

## 2014-10-11 DIAGNOSIS — E119 Type 2 diabetes mellitus without complications: Secondary | ICD-10-CM | POA: Diagnosis not present

## 2014-10-11 DIAGNOSIS — Z7982 Long term (current) use of aspirin: Secondary | ICD-10-CM | POA: Diagnosis not present

## 2014-10-11 DIAGNOSIS — Z992 Dependence on renal dialysis: Secondary | ICD-10-CM | POA: Diagnosis not present

## 2014-10-12 DIAGNOSIS — F4323 Adjustment disorder with mixed anxiety and depressed mood: Secondary | ICD-10-CM | POA: Diagnosis not present

## 2014-10-20 DIAGNOSIS — F4323 Adjustment disorder with mixed anxiety and depressed mood: Secondary | ICD-10-CM | POA: Diagnosis not present

## 2014-10-27 DIAGNOSIS — Z992 Dependence on renal dialysis: Secondary | ICD-10-CM | POA: Diagnosis not present

## 2014-10-27 DIAGNOSIS — N186 End stage renal disease: Secondary | ICD-10-CM | POA: Diagnosis not present

## 2014-10-27 DIAGNOSIS — F4323 Adjustment disorder with mixed anxiety and depressed mood: Secondary | ICD-10-CM | POA: Diagnosis not present

## 2014-10-27 DIAGNOSIS — E1129 Type 2 diabetes mellitus with other diabetic kidney complication: Secondary | ICD-10-CM | POA: Diagnosis not present

## 2014-10-28 DIAGNOSIS — N2581 Secondary hyperparathyroidism of renal origin: Secondary | ICD-10-CM | POA: Diagnosis not present

## 2014-10-28 DIAGNOSIS — E1129 Type 2 diabetes mellitus with other diabetic kidney complication: Secondary | ICD-10-CM | POA: Diagnosis not present

## 2014-10-28 DIAGNOSIS — N186 End stage renal disease: Secondary | ICD-10-CM | POA: Diagnosis not present

## 2014-10-28 DIAGNOSIS — D631 Anemia in chronic kidney disease: Secondary | ICD-10-CM | POA: Diagnosis not present

## 2014-10-30 DIAGNOSIS — N2581 Secondary hyperparathyroidism of renal origin: Secondary | ICD-10-CM | POA: Diagnosis not present

## 2014-10-30 DIAGNOSIS — N186 End stage renal disease: Secondary | ICD-10-CM | POA: Diagnosis not present

## 2014-10-30 DIAGNOSIS — E1129 Type 2 diabetes mellitus with other diabetic kidney complication: Secondary | ICD-10-CM | POA: Diagnosis not present

## 2014-10-30 DIAGNOSIS — D631 Anemia in chronic kidney disease: Secondary | ICD-10-CM | POA: Diagnosis not present

## 2014-11-02 DIAGNOSIS — N186 End stage renal disease: Secondary | ICD-10-CM | POA: Diagnosis not present

## 2014-11-02 DIAGNOSIS — N2581 Secondary hyperparathyroidism of renal origin: Secondary | ICD-10-CM | POA: Diagnosis not present

## 2014-11-02 DIAGNOSIS — E1129 Type 2 diabetes mellitus with other diabetic kidney complication: Secondary | ICD-10-CM | POA: Diagnosis not present

## 2014-11-02 DIAGNOSIS — D631 Anemia in chronic kidney disease: Secondary | ICD-10-CM | POA: Diagnosis not present

## 2014-11-03 DIAGNOSIS — F4323 Adjustment disorder with mixed anxiety and depressed mood: Secondary | ICD-10-CM | POA: Diagnosis not present

## 2014-11-04 DIAGNOSIS — D631 Anemia in chronic kidney disease: Secondary | ICD-10-CM | POA: Diagnosis not present

## 2014-11-04 DIAGNOSIS — N186 End stage renal disease: Secondary | ICD-10-CM | POA: Diagnosis not present

## 2014-11-04 DIAGNOSIS — E1129 Type 2 diabetes mellitus with other diabetic kidney complication: Secondary | ICD-10-CM | POA: Diagnosis not present

## 2014-11-04 DIAGNOSIS — N2581 Secondary hyperparathyroidism of renal origin: Secondary | ICD-10-CM | POA: Diagnosis not present

## 2014-11-06 DIAGNOSIS — D631 Anemia in chronic kidney disease: Secondary | ICD-10-CM | POA: Diagnosis not present

## 2014-11-06 DIAGNOSIS — N186 End stage renal disease: Secondary | ICD-10-CM | POA: Diagnosis not present

## 2014-11-06 DIAGNOSIS — E1129 Type 2 diabetes mellitus with other diabetic kidney complication: Secondary | ICD-10-CM | POA: Diagnosis not present

## 2014-11-06 DIAGNOSIS — N2581 Secondary hyperparathyroidism of renal origin: Secondary | ICD-10-CM | POA: Diagnosis not present

## 2014-11-09 DIAGNOSIS — N186 End stage renal disease: Secondary | ICD-10-CM | POA: Diagnosis not present

## 2014-11-09 DIAGNOSIS — D631 Anemia in chronic kidney disease: Secondary | ICD-10-CM | POA: Diagnosis not present

## 2014-11-09 DIAGNOSIS — N2581 Secondary hyperparathyroidism of renal origin: Secondary | ICD-10-CM | POA: Diagnosis not present

## 2014-11-09 DIAGNOSIS — E1129 Type 2 diabetes mellitus with other diabetic kidney complication: Secondary | ICD-10-CM | POA: Diagnosis not present

## 2014-11-10 DIAGNOSIS — F4323 Adjustment disorder with mixed anxiety and depressed mood: Secondary | ICD-10-CM | POA: Diagnosis not present

## 2014-11-11 DIAGNOSIS — N2581 Secondary hyperparathyroidism of renal origin: Secondary | ICD-10-CM | POA: Diagnosis not present

## 2014-11-11 DIAGNOSIS — N186 End stage renal disease: Secondary | ICD-10-CM | POA: Diagnosis not present

## 2014-11-11 DIAGNOSIS — E1129 Type 2 diabetes mellitus with other diabetic kidney complication: Secondary | ICD-10-CM | POA: Diagnosis not present

## 2014-11-11 DIAGNOSIS — D631 Anemia in chronic kidney disease: Secondary | ICD-10-CM | POA: Diagnosis not present

## 2014-11-13 DIAGNOSIS — E1129 Type 2 diabetes mellitus with other diabetic kidney complication: Secondary | ICD-10-CM | POA: Diagnosis not present

## 2014-11-13 DIAGNOSIS — N2581 Secondary hyperparathyroidism of renal origin: Secondary | ICD-10-CM | POA: Diagnosis not present

## 2014-11-13 DIAGNOSIS — D631 Anemia in chronic kidney disease: Secondary | ICD-10-CM | POA: Diagnosis not present

## 2014-11-13 DIAGNOSIS — N186 End stage renal disease: Secondary | ICD-10-CM | POA: Diagnosis not present

## 2014-11-16 DIAGNOSIS — N2581 Secondary hyperparathyroidism of renal origin: Secondary | ICD-10-CM | POA: Diagnosis not present

## 2014-11-16 DIAGNOSIS — D631 Anemia in chronic kidney disease: Secondary | ICD-10-CM | POA: Diagnosis not present

## 2014-11-16 DIAGNOSIS — E1129 Type 2 diabetes mellitus with other diabetic kidney complication: Secondary | ICD-10-CM | POA: Diagnosis not present

## 2014-11-16 DIAGNOSIS — N186 End stage renal disease: Secondary | ICD-10-CM | POA: Diagnosis not present

## 2014-11-17 DIAGNOSIS — N8320 Unspecified ovarian cysts: Secondary | ICD-10-CM | POA: Diagnosis not present

## 2014-11-17 DIAGNOSIS — B373 Candidiasis of vulva and vagina: Secondary | ICD-10-CM | POA: Diagnosis not present

## 2014-11-17 DIAGNOSIS — N760 Acute vaginitis: Secondary | ICD-10-CM | POA: Diagnosis not present

## 2014-11-17 DIAGNOSIS — Z113 Encounter for screening for infections with a predominantly sexual mode of transmission: Secondary | ICD-10-CM | POA: Diagnosis not present

## 2014-11-17 DIAGNOSIS — N898 Other specified noninflammatory disorders of vagina: Secondary | ICD-10-CM | POA: Diagnosis not present

## 2014-11-17 DIAGNOSIS — Z118 Encounter for screening for other infectious and parasitic diseases: Secondary | ICD-10-CM | POA: Diagnosis not present

## 2014-11-18 DIAGNOSIS — E1129 Type 2 diabetes mellitus with other diabetic kidney complication: Secondary | ICD-10-CM | POA: Diagnosis not present

## 2014-11-18 DIAGNOSIS — N186 End stage renal disease: Secondary | ICD-10-CM | POA: Diagnosis not present

## 2014-11-18 DIAGNOSIS — N2581 Secondary hyperparathyroidism of renal origin: Secondary | ICD-10-CM | POA: Diagnosis not present

## 2014-11-18 DIAGNOSIS — D631 Anemia in chronic kidney disease: Secondary | ICD-10-CM | POA: Diagnosis not present

## 2014-11-19 DIAGNOSIS — I10 Essential (primary) hypertension: Secondary | ICD-10-CM | POA: Diagnosis not present

## 2014-11-19 DIAGNOSIS — I739 Peripheral vascular disease, unspecified: Secondary | ICD-10-CM | POA: Diagnosis not present

## 2014-11-19 DIAGNOSIS — E118 Type 2 diabetes mellitus with unspecified complications: Secondary | ICD-10-CM | POA: Diagnosis not present

## 2014-11-19 DIAGNOSIS — Z6830 Body mass index (BMI) 30.0-30.9, adult: Secondary | ICD-10-CM | POA: Diagnosis not present

## 2014-11-20 DIAGNOSIS — N2581 Secondary hyperparathyroidism of renal origin: Secondary | ICD-10-CM | POA: Diagnosis not present

## 2014-11-20 DIAGNOSIS — E1129 Type 2 diabetes mellitus with other diabetic kidney complication: Secondary | ICD-10-CM | POA: Diagnosis not present

## 2014-11-20 DIAGNOSIS — N186 End stage renal disease: Secondary | ICD-10-CM | POA: Diagnosis not present

## 2014-11-20 DIAGNOSIS — D631 Anemia in chronic kidney disease: Secondary | ICD-10-CM | POA: Diagnosis not present

## 2014-11-23 DIAGNOSIS — E1129 Type 2 diabetes mellitus with other diabetic kidney complication: Secondary | ICD-10-CM | POA: Diagnosis not present

## 2014-11-23 DIAGNOSIS — D631 Anemia in chronic kidney disease: Secondary | ICD-10-CM | POA: Diagnosis not present

## 2014-11-23 DIAGNOSIS — N186 End stage renal disease: Secondary | ICD-10-CM | POA: Diagnosis not present

## 2014-11-23 DIAGNOSIS — N2581 Secondary hyperparathyroidism of renal origin: Secondary | ICD-10-CM | POA: Diagnosis not present

## 2014-11-24 DIAGNOSIS — N8320 Unspecified ovarian cysts: Secondary | ICD-10-CM | POA: Diagnosis not present

## 2014-11-24 DIAGNOSIS — R1084 Generalized abdominal pain: Secondary | ICD-10-CM | POA: Diagnosis not present

## 2014-11-25 DIAGNOSIS — N2581 Secondary hyperparathyroidism of renal origin: Secondary | ICD-10-CM | POA: Diagnosis not present

## 2014-11-25 DIAGNOSIS — D631 Anemia in chronic kidney disease: Secondary | ICD-10-CM | POA: Diagnosis not present

## 2014-11-25 DIAGNOSIS — E1129 Type 2 diabetes mellitus with other diabetic kidney complication: Secondary | ICD-10-CM | POA: Diagnosis not present

## 2014-11-25 DIAGNOSIS — N186 End stage renal disease: Secondary | ICD-10-CM | POA: Diagnosis not present

## 2014-11-26 DIAGNOSIS — N186 End stage renal disease: Secondary | ICD-10-CM | POA: Diagnosis not present

## 2014-11-26 DIAGNOSIS — Z992 Dependence on renal dialysis: Secondary | ICD-10-CM | POA: Diagnosis not present

## 2014-11-26 DIAGNOSIS — E1129 Type 2 diabetes mellitus with other diabetic kidney complication: Secondary | ICD-10-CM | POA: Diagnosis not present

## 2014-11-27 DIAGNOSIS — E1129 Type 2 diabetes mellitus with other diabetic kidney complication: Secondary | ICD-10-CM | POA: Diagnosis not present

## 2014-11-27 DIAGNOSIS — Z23 Encounter for immunization: Secondary | ICD-10-CM | POA: Diagnosis not present

## 2014-11-27 DIAGNOSIS — N2581 Secondary hyperparathyroidism of renal origin: Secondary | ICD-10-CM | POA: Diagnosis not present

## 2014-11-27 DIAGNOSIS — N186 End stage renal disease: Secondary | ICD-10-CM | POA: Diagnosis not present

## 2014-11-27 DIAGNOSIS — D509 Iron deficiency anemia, unspecified: Secondary | ICD-10-CM | POA: Diagnosis not present

## 2014-11-27 DIAGNOSIS — D631 Anemia in chronic kidney disease: Secondary | ICD-10-CM | POA: Diagnosis not present

## 2014-11-30 DIAGNOSIS — D509 Iron deficiency anemia, unspecified: Secondary | ICD-10-CM | POA: Diagnosis not present

## 2014-11-30 DIAGNOSIS — N186 End stage renal disease: Secondary | ICD-10-CM | POA: Diagnosis not present

## 2014-11-30 DIAGNOSIS — E1129 Type 2 diabetes mellitus with other diabetic kidney complication: Secondary | ICD-10-CM | POA: Diagnosis not present

## 2014-11-30 DIAGNOSIS — D631 Anemia in chronic kidney disease: Secondary | ICD-10-CM | POA: Diagnosis not present

## 2014-11-30 DIAGNOSIS — Z23 Encounter for immunization: Secondary | ICD-10-CM | POA: Diagnosis not present

## 2014-11-30 DIAGNOSIS — N2581 Secondary hyperparathyroidism of renal origin: Secondary | ICD-10-CM | POA: Diagnosis not present

## 2014-12-01 DIAGNOSIS — F4323 Adjustment disorder with mixed anxiety and depressed mood: Secondary | ICD-10-CM | POA: Diagnosis not present

## 2014-12-02 DIAGNOSIS — Z23 Encounter for immunization: Secondary | ICD-10-CM | POA: Diagnosis not present

## 2014-12-02 DIAGNOSIS — D509 Iron deficiency anemia, unspecified: Secondary | ICD-10-CM | POA: Diagnosis not present

## 2014-12-02 DIAGNOSIS — E1129 Type 2 diabetes mellitus with other diabetic kidney complication: Secondary | ICD-10-CM | POA: Diagnosis not present

## 2014-12-02 DIAGNOSIS — N2581 Secondary hyperparathyroidism of renal origin: Secondary | ICD-10-CM | POA: Diagnosis not present

## 2014-12-02 DIAGNOSIS — D631 Anemia in chronic kidney disease: Secondary | ICD-10-CM | POA: Diagnosis not present

## 2014-12-02 DIAGNOSIS — N186 End stage renal disease: Secondary | ICD-10-CM | POA: Diagnosis not present

## 2014-12-04 DIAGNOSIS — E1129 Type 2 diabetes mellitus with other diabetic kidney complication: Secondary | ICD-10-CM | POA: Diagnosis not present

## 2014-12-04 DIAGNOSIS — N2581 Secondary hyperparathyroidism of renal origin: Secondary | ICD-10-CM | POA: Diagnosis not present

## 2014-12-04 DIAGNOSIS — D631 Anemia in chronic kidney disease: Secondary | ICD-10-CM | POA: Diagnosis not present

## 2014-12-04 DIAGNOSIS — D509 Iron deficiency anemia, unspecified: Secondary | ICD-10-CM | POA: Diagnosis not present

## 2014-12-04 DIAGNOSIS — N186 End stage renal disease: Secondary | ICD-10-CM | POA: Diagnosis not present

## 2014-12-04 DIAGNOSIS — Z23 Encounter for immunization: Secondary | ICD-10-CM | POA: Diagnosis not present

## 2014-12-07 DIAGNOSIS — Z23 Encounter for immunization: Secondary | ICD-10-CM | POA: Diagnosis not present

## 2014-12-07 DIAGNOSIS — E1129 Type 2 diabetes mellitus with other diabetic kidney complication: Secondary | ICD-10-CM | POA: Diagnosis not present

## 2014-12-07 DIAGNOSIS — D631 Anemia in chronic kidney disease: Secondary | ICD-10-CM | POA: Diagnosis not present

## 2014-12-07 DIAGNOSIS — N186 End stage renal disease: Secondary | ICD-10-CM | POA: Diagnosis not present

## 2014-12-07 DIAGNOSIS — D509 Iron deficiency anemia, unspecified: Secondary | ICD-10-CM | POA: Diagnosis not present

## 2014-12-07 DIAGNOSIS — N2581 Secondary hyperparathyroidism of renal origin: Secondary | ICD-10-CM | POA: Diagnosis not present

## 2014-12-09 DIAGNOSIS — N186 End stage renal disease: Secondary | ICD-10-CM | POA: Diagnosis not present

## 2014-12-09 DIAGNOSIS — D509 Iron deficiency anemia, unspecified: Secondary | ICD-10-CM | POA: Diagnosis not present

## 2014-12-09 DIAGNOSIS — Z23 Encounter for immunization: Secondary | ICD-10-CM | POA: Diagnosis not present

## 2014-12-09 DIAGNOSIS — E1129 Type 2 diabetes mellitus with other diabetic kidney complication: Secondary | ICD-10-CM | POA: Diagnosis not present

## 2014-12-09 DIAGNOSIS — N2581 Secondary hyperparathyroidism of renal origin: Secondary | ICD-10-CM | POA: Diagnosis not present

## 2014-12-09 DIAGNOSIS — D631 Anemia in chronic kidney disease: Secondary | ICD-10-CM | POA: Diagnosis not present

## 2014-12-10 DIAGNOSIS — Z23 Encounter for immunization: Secondary | ICD-10-CM | POA: Diagnosis not present

## 2014-12-10 DIAGNOSIS — N186 End stage renal disease: Secondary | ICD-10-CM | POA: Diagnosis not present

## 2014-12-10 DIAGNOSIS — D509 Iron deficiency anemia, unspecified: Secondary | ICD-10-CM | POA: Diagnosis not present

## 2014-12-10 DIAGNOSIS — N2581 Secondary hyperparathyroidism of renal origin: Secondary | ICD-10-CM | POA: Diagnosis not present

## 2014-12-10 DIAGNOSIS — D631 Anemia in chronic kidney disease: Secondary | ICD-10-CM | POA: Diagnosis not present

## 2014-12-10 DIAGNOSIS — E1129 Type 2 diabetes mellitus with other diabetic kidney complication: Secondary | ICD-10-CM | POA: Diagnosis not present

## 2014-12-14 DIAGNOSIS — N186 End stage renal disease: Secondary | ICD-10-CM | POA: Diagnosis not present

## 2014-12-14 DIAGNOSIS — D509 Iron deficiency anemia, unspecified: Secondary | ICD-10-CM | POA: Diagnosis not present

## 2014-12-14 DIAGNOSIS — E1129 Type 2 diabetes mellitus with other diabetic kidney complication: Secondary | ICD-10-CM | POA: Diagnosis not present

## 2014-12-14 DIAGNOSIS — D631 Anemia in chronic kidney disease: Secondary | ICD-10-CM | POA: Diagnosis not present

## 2014-12-14 DIAGNOSIS — Z23 Encounter for immunization: Secondary | ICD-10-CM | POA: Diagnosis not present

## 2014-12-14 DIAGNOSIS — N2581 Secondary hyperparathyroidism of renal origin: Secondary | ICD-10-CM | POA: Diagnosis not present

## 2014-12-16 DIAGNOSIS — D631 Anemia in chronic kidney disease: Secondary | ICD-10-CM | POA: Diagnosis not present

## 2014-12-16 DIAGNOSIS — D509 Iron deficiency anemia, unspecified: Secondary | ICD-10-CM | POA: Diagnosis not present

## 2014-12-16 DIAGNOSIS — Z23 Encounter for immunization: Secondary | ICD-10-CM | POA: Diagnosis not present

## 2014-12-16 DIAGNOSIS — N2581 Secondary hyperparathyroidism of renal origin: Secondary | ICD-10-CM | POA: Diagnosis not present

## 2014-12-16 DIAGNOSIS — N186 End stage renal disease: Secondary | ICD-10-CM | POA: Diagnosis not present

## 2014-12-16 DIAGNOSIS — E1129 Type 2 diabetes mellitus with other diabetic kidney complication: Secondary | ICD-10-CM | POA: Diagnosis not present

## 2014-12-18 DIAGNOSIS — N186 End stage renal disease: Secondary | ICD-10-CM | POA: Diagnosis not present

## 2014-12-18 DIAGNOSIS — Z23 Encounter for immunization: Secondary | ICD-10-CM | POA: Diagnosis not present

## 2014-12-18 DIAGNOSIS — N2581 Secondary hyperparathyroidism of renal origin: Secondary | ICD-10-CM | POA: Diagnosis not present

## 2014-12-18 DIAGNOSIS — E1129 Type 2 diabetes mellitus with other diabetic kidney complication: Secondary | ICD-10-CM | POA: Diagnosis not present

## 2014-12-18 DIAGNOSIS — D631 Anemia in chronic kidney disease: Secondary | ICD-10-CM | POA: Diagnosis not present

## 2014-12-18 DIAGNOSIS — D509 Iron deficiency anemia, unspecified: Secondary | ICD-10-CM | POA: Diagnosis not present

## 2014-12-21 DIAGNOSIS — Z23 Encounter for immunization: Secondary | ICD-10-CM | POA: Diagnosis not present

## 2014-12-21 DIAGNOSIS — D631 Anemia in chronic kidney disease: Secondary | ICD-10-CM | POA: Diagnosis not present

## 2014-12-21 DIAGNOSIS — N2581 Secondary hyperparathyroidism of renal origin: Secondary | ICD-10-CM | POA: Diagnosis not present

## 2014-12-21 DIAGNOSIS — E1129 Type 2 diabetes mellitus with other diabetic kidney complication: Secondary | ICD-10-CM | POA: Diagnosis not present

## 2014-12-21 DIAGNOSIS — D509 Iron deficiency anemia, unspecified: Secondary | ICD-10-CM | POA: Diagnosis not present

## 2014-12-21 DIAGNOSIS — N186 End stage renal disease: Secondary | ICD-10-CM | POA: Diagnosis not present

## 2014-12-23 DIAGNOSIS — E1129 Type 2 diabetes mellitus with other diabetic kidney complication: Secondary | ICD-10-CM | POA: Diagnosis not present

## 2014-12-23 DIAGNOSIS — D509 Iron deficiency anemia, unspecified: Secondary | ICD-10-CM | POA: Diagnosis not present

## 2014-12-23 DIAGNOSIS — N186 End stage renal disease: Secondary | ICD-10-CM | POA: Diagnosis not present

## 2014-12-23 DIAGNOSIS — Z23 Encounter for immunization: Secondary | ICD-10-CM | POA: Diagnosis not present

## 2014-12-23 DIAGNOSIS — N2581 Secondary hyperparathyroidism of renal origin: Secondary | ICD-10-CM | POA: Diagnosis not present

## 2014-12-23 DIAGNOSIS — D631 Anemia in chronic kidney disease: Secondary | ICD-10-CM | POA: Diagnosis not present

## 2014-12-25 DIAGNOSIS — D631 Anemia in chronic kidney disease: Secondary | ICD-10-CM | POA: Diagnosis not present

## 2014-12-25 DIAGNOSIS — Z23 Encounter for immunization: Secondary | ICD-10-CM | POA: Diagnosis not present

## 2014-12-25 DIAGNOSIS — N2581 Secondary hyperparathyroidism of renal origin: Secondary | ICD-10-CM | POA: Diagnosis not present

## 2014-12-25 DIAGNOSIS — N186 End stage renal disease: Secondary | ICD-10-CM | POA: Diagnosis not present

## 2014-12-25 DIAGNOSIS — E1129 Type 2 diabetes mellitus with other diabetic kidney complication: Secondary | ICD-10-CM | POA: Diagnosis not present

## 2014-12-25 DIAGNOSIS — D509 Iron deficiency anemia, unspecified: Secondary | ICD-10-CM | POA: Diagnosis not present

## 2014-12-27 DIAGNOSIS — N186 End stage renal disease: Secondary | ICD-10-CM | POA: Diagnosis not present

## 2014-12-27 DIAGNOSIS — E1129 Type 2 diabetes mellitus with other diabetic kidney complication: Secondary | ICD-10-CM | POA: Diagnosis not present

## 2014-12-27 DIAGNOSIS — Z992 Dependence on renal dialysis: Secondary | ICD-10-CM | POA: Diagnosis not present

## 2014-12-28 DIAGNOSIS — D509 Iron deficiency anemia, unspecified: Secondary | ICD-10-CM | POA: Diagnosis not present

## 2014-12-28 DIAGNOSIS — E1129 Type 2 diabetes mellitus with other diabetic kidney complication: Secondary | ICD-10-CM | POA: Diagnosis not present

## 2014-12-28 DIAGNOSIS — N2581 Secondary hyperparathyroidism of renal origin: Secondary | ICD-10-CM | POA: Diagnosis not present

## 2014-12-28 DIAGNOSIS — N186 End stage renal disease: Secondary | ICD-10-CM | POA: Diagnosis not present

## 2014-12-29 DIAGNOSIS — R300 Dysuria: Secondary | ICD-10-CM | POA: Diagnosis not present

## 2014-12-30 DIAGNOSIS — N2581 Secondary hyperparathyroidism of renal origin: Secondary | ICD-10-CM | POA: Diagnosis not present

## 2014-12-30 DIAGNOSIS — N186 End stage renal disease: Secondary | ICD-10-CM | POA: Diagnosis not present

## 2014-12-30 DIAGNOSIS — D509 Iron deficiency anemia, unspecified: Secondary | ICD-10-CM | POA: Diagnosis not present

## 2014-12-30 DIAGNOSIS — E1129 Type 2 diabetes mellitus with other diabetic kidney complication: Secondary | ICD-10-CM | POA: Diagnosis not present

## 2015-01-01 DIAGNOSIS — E1129 Type 2 diabetes mellitus with other diabetic kidney complication: Secondary | ICD-10-CM | POA: Diagnosis not present

## 2015-01-01 DIAGNOSIS — D509 Iron deficiency anemia, unspecified: Secondary | ICD-10-CM | POA: Diagnosis not present

## 2015-01-01 DIAGNOSIS — N2581 Secondary hyperparathyroidism of renal origin: Secondary | ICD-10-CM | POA: Diagnosis not present

## 2015-01-01 DIAGNOSIS — N186 End stage renal disease: Secondary | ICD-10-CM | POA: Diagnosis not present

## 2015-01-04 DIAGNOSIS — D509 Iron deficiency anemia, unspecified: Secondary | ICD-10-CM | POA: Diagnosis not present

## 2015-01-04 DIAGNOSIS — E1129 Type 2 diabetes mellitus with other diabetic kidney complication: Secondary | ICD-10-CM | POA: Diagnosis not present

## 2015-01-04 DIAGNOSIS — N2581 Secondary hyperparathyroidism of renal origin: Secondary | ICD-10-CM | POA: Diagnosis not present

## 2015-01-04 DIAGNOSIS — N186 End stage renal disease: Secondary | ICD-10-CM | POA: Diagnosis not present

## 2015-01-05 DIAGNOSIS — F4323 Adjustment disorder with mixed anxiety and depressed mood: Secondary | ICD-10-CM | POA: Diagnosis not present

## 2015-01-06 DIAGNOSIS — N186 End stage renal disease: Secondary | ICD-10-CM | POA: Diagnosis not present

## 2015-01-06 DIAGNOSIS — E1129 Type 2 diabetes mellitus with other diabetic kidney complication: Secondary | ICD-10-CM | POA: Diagnosis not present

## 2015-01-06 DIAGNOSIS — N2581 Secondary hyperparathyroidism of renal origin: Secondary | ICD-10-CM | POA: Diagnosis not present

## 2015-01-06 DIAGNOSIS — D509 Iron deficiency anemia, unspecified: Secondary | ICD-10-CM | POA: Diagnosis not present

## 2015-01-08 DIAGNOSIS — E1129 Type 2 diabetes mellitus with other diabetic kidney complication: Secondary | ICD-10-CM | POA: Diagnosis not present

## 2015-01-08 DIAGNOSIS — N186 End stage renal disease: Secondary | ICD-10-CM | POA: Diagnosis not present

## 2015-01-08 DIAGNOSIS — D509 Iron deficiency anemia, unspecified: Secondary | ICD-10-CM | POA: Diagnosis not present

## 2015-01-08 DIAGNOSIS — N2581 Secondary hyperparathyroidism of renal origin: Secondary | ICD-10-CM | POA: Diagnosis not present

## 2015-01-11 DIAGNOSIS — N186 End stage renal disease: Secondary | ICD-10-CM | POA: Diagnosis not present

## 2015-01-11 DIAGNOSIS — E1129 Type 2 diabetes mellitus with other diabetic kidney complication: Secondary | ICD-10-CM | POA: Diagnosis not present

## 2015-01-11 DIAGNOSIS — N2581 Secondary hyperparathyroidism of renal origin: Secondary | ICD-10-CM | POA: Diagnosis not present

## 2015-01-11 DIAGNOSIS — D509 Iron deficiency anemia, unspecified: Secondary | ICD-10-CM | POA: Diagnosis not present

## 2015-01-12 DIAGNOSIS — F4323 Adjustment disorder with mixed anxiety and depressed mood: Secondary | ICD-10-CM | POA: Diagnosis not present

## 2015-01-13 DIAGNOSIS — N186 End stage renal disease: Secondary | ICD-10-CM | POA: Diagnosis not present

## 2015-01-13 DIAGNOSIS — D509 Iron deficiency anemia, unspecified: Secondary | ICD-10-CM | POA: Diagnosis not present

## 2015-01-13 DIAGNOSIS — N2581 Secondary hyperparathyroidism of renal origin: Secondary | ICD-10-CM | POA: Diagnosis not present

## 2015-01-13 DIAGNOSIS — E1129 Type 2 diabetes mellitus with other diabetic kidney complication: Secondary | ICD-10-CM | POA: Diagnosis not present

## 2015-01-15 DIAGNOSIS — N2581 Secondary hyperparathyroidism of renal origin: Secondary | ICD-10-CM | POA: Diagnosis not present

## 2015-01-15 DIAGNOSIS — N186 End stage renal disease: Secondary | ICD-10-CM | POA: Diagnosis not present

## 2015-01-15 DIAGNOSIS — E1129 Type 2 diabetes mellitus with other diabetic kidney complication: Secondary | ICD-10-CM | POA: Diagnosis not present

## 2015-01-15 DIAGNOSIS — D509 Iron deficiency anemia, unspecified: Secondary | ICD-10-CM | POA: Diagnosis not present

## 2015-01-18 DIAGNOSIS — N2581 Secondary hyperparathyroidism of renal origin: Secondary | ICD-10-CM | POA: Diagnosis not present

## 2015-01-18 DIAGNOSIS — E1129 Type 2 diabetes mellitus with other diabetic kidney complication: Secondary | ICD-10-CM | POA: Diagnosis not present

## 2015-01-18 DIAGNOSIS — D509 Iron deficiency anemia, unspecified: Secondary | ICD-10-CM | POA: Diagnosis not present

## 2015-01-18 DIAGNOSIS — N186 End stage renal disease: Secondary | ICD-10-CM | POA: Diagnosis not present

## 2015-01-19 DIAGNOSIS — N186 End stage renal disease: Secondary | ICD-10-CM | POA: Diagnosis not present

## 2015-01-19 DIAGNOSIS — N2581 Secondary hyperparathyroidism of renal origin: Secondary | ICD-10-CM | POA: Diagnosis not present

## 2015-01-19 DIAGNOSIS — D509 Iron deficiency anemia, unspecified: Secondary | ICD-10-CM | POA: Diagnosis not present

## 2015-01-19 DIAGNOSIS — E1129 Type 2 diabetes mellitus with other diabetic kidney complication: Secondary | ICD-10-CM | POA: Diagnosis not present

## 2015-01-21 DIAGNOSIS — E1129 Type 2 diabetes mellitus with other diabetic kidney complication: Secondary | ICD-10-CM | POA: Diagnosis not present

## 2015-01-21 DIAGNOSIS — N186 End stage renal disease: Secondary | ICD-10-CM | POA: Diagnosis not present

## 2015-01-21 DIAGNOSIS — N2581 Secondary hyperparathyroidism of renal origin: Secondary | ICD-10-CM | POA: Diagnosis not present

## 2015-01-21 DIAGNOSIS — D509 Iron deficiency anemia, unspecified: Secondary | ICD-10-CM | POA: Diagnosis not present

## 2015-01-25 DIAGNOSIS — E1129 Type 2 diabetes mellitus with other diabetic kidney complication: Secondary | ICD-10-CM | POA: Diagnosis not present

## 2015-01-25 DIAGNOSIS — N2581 Secondary hyperparathyroidism of renal origin: Secondary | ICD-10-CM | POA: Diagnosis not present

## 2015-01-25 DIAGNOSIS — D509 Iron deficiency anemia, unspecified: Secondary | ICD-10-CM | POA: Diagnosis not present

## 2015-01-25 DIAGNOSIS — N186 End stage renal disease: Secondary | ICD-10-CM | POA: Diagnosis not present

## 2015-01-26 DIAGNOSIS — Z992 Dependence on renal dialysis: Secondary | ICD-10-CM | POA: Diagnosis not present

## 2015-01-26 DIAGNOSIS — E1129 Type 2 diabetes mellitus with other diabetic kidney complication: Secondary | ICD-10-CM | POA: Diagnosis not present

## 2015-01-26 DIAGNOSIS — N186 End stage renal disease: Secondary | ICD-10-CM | POA: Diagnosis not present

## 2015-01-27 DIAGNOSIS — N2581 Secondary hyperparathyroidism of renal origin: Secondary | ICD-10-CM | POA: Diagnosis not present

## 2015-01-27 DIAGNOSIS — D509 Iron deficiency anemia, unspecified: Secondary | ICD-10-CM | POA: Diagnosis not present

## 2015-01-27 DIAGNOSIS — E1129 Type 2 diabetes mellitus with other diabetic kidney complication: Secondary | ICD-10-CM | POA: Diagnosis not present

## 2015-01-27 DIAGNOSIS — N186 End stage renal disease: Secondary | ICD-10-CM | POA: Diagnosis not present

## 2015-02-07 DIAGNOSIS — F4323 Adjustment disorder with mixed anxiety and depressed mood: Secondary | ICD-10-CM | POA: Diagnosis not present

## 2015-02-11 DIAGNOSIS — I871 Compression of vein: Secondary | ICD-10-CM | POA: Diagnosis not present

## 2015-02-11 DIAGNOSIS — N186 End stage renal disease: Secondary | ICD-10-CM | POA: Diagnosis not present

## 2015-02-11 DIAGNOSIS — Z992 Dependence on renal dialysis: Secondary | ICD-10-CM | POA: Diagnosis not present

## 2015-02-11 DIAGNOSIS — T82858D Stenosis of vascular prosthetic devices, implants and grafts, subsequent encounter: Secondary | ICD-10-CM | POA: Diagnosis not present

## 2015-02-26 DIAGNOSIS — Z992 Dependence on renal dialysis: Secondary | ICD-10-CM | POA: Diagnosis not present

## 2015-02-26 DIAGNOSIS — E1129 Type 2 diabetes mellitus with other diabetic kidney complication: Secondary | ICD-10-CM | POA: Diagnosis not present

## 2015-02-26 DIAGNOSIS — N186 End stage renal disease: Secondary | ICD-10-CM | POA: Diagnosis not present

## 2015-03-01 DIAGNOSIS — N186 End stage renal disease: Secondary | ICD-10-CM | POA: Diagnosis not present

## 2015-03-01 DIAGNOSIS — E1129 Type 2 diabetes mellitus with other diabetic kidney complication: Secondary | ICD-10-CM | POA: Diagnosis not present

## 2015-03-01 DIAGNOSIS — N2581 Secondary hyperparathyroidism of renal origin: Secondary | ICD-10-CM | POA: Diagnosis not present

## 2015-03-02 DIAGNOSIS — F4323 Adjustment disorder with mixed anxiety and depressed mood: Secondary | ICD-10-CM | POA: Diagnosis not present

## 2015-03-04 DIAGNOSIS — Z992 Dependence on renal dialysis: Secondary | ICD-10-CM | POA: Diagnosis not present

## 2015-03-04 DIAGNOSIS — T82858D Stenosis of vascular prosthetic devices, implants and grafts, subsequent encounter: Secondary | ICD-10-CM | POA: Diagnosis not present

## 2015-03-04 DIAGNOSIS — I771 Stricture of artery: Secondary | ICD-10-CM | POA: Diagnosis not present

## 2015-03-04 DIAGNOSIS — N186 End stage renal disease: Secondary | ICD-10-CM | POA: Diagnosis not present

## 2015-03-09 DIAGNOSIS — F4323 Adjustment disorder with mixed anxiety and depressed mood: Secondary | ICD-10-CM | POA: Diagnosis not present

## 2015-03-24 DIAGNOSIS — E1129 Type 2 diabetes mellitus with other diabetic kidney complication: Secondary | ICD-10-CM | POA: Diagnosis not present

## 2015-03-29 DIAGNOSIS — E1129 Type 2 diabetes mellitus with other diabetic kidney complication: Secondary | ICD-10-CM | POA: Diagnosis not present

## 2015-03-29 DIAGNOSIS — N186 End stage renal disease: Secondary | ICD-10-CM | POA: Diagnosis not present

## 2015-03-29 DIAGNOSIS — Z992 Dependence on renal dialysis: Secondary | ICD-10-CM | POA: Diagnosis not present

## 2015-03-30 DIAGNOSIS — F4323 Adjustment disorder with mixed anxiety and depressed mood: Secondary | ICD-10-CM | POA: Diagnosis not present

## 2015-03-31 DIAGNOSIS — D509 Iron deficiency anemia, unspecified: Secondary | ICD-10-CM | POA: Diagnosis not present

## 2015-03-31 DIAGNOSIS — N2581 Secondary hyperparathyroidism of renal origin: Secondary | ICD-10-CM | POA: Diagnosis not present

## 2015-03-31 DIAGNOSIS — E1129 Type 2 diabetes mellitus with other diabetic kidney complication: Secondary | ICD-10-CM | POA: Diagnosis not present

## 2015-03-31 DIAGNOSIS — N186 End stage renal disease: Secondary | ICD-10-CM | POA: Diagnosis not present

## 2015-04-01 DIAGNOSIS — I70218 Atherosclerosis of native arteries of extremities with intermittent claudication, other extremity: Secondary | ICD-10-CM | POA: Diagnosis not present

## 2015-04-01 DIAGNOSIS — T82856A Stenosis of peripheral vascular stent, initial encounter: Secondary | ICD-10-CM | POA: Diagnosis not present

## 2015-04-01 DIAGNOSIS — I739 Peripheral vascular disease, unspecified: Secondary | ICD-10-CM | POA: Diagnosis not present

## 2015-04-01 DIAGNOSIS — Z992 Dependence on renal dialysis: Secondary | ICD-10-CM | POA: Diagnosis not present

## 2015-04-01 DIAGNOSIS — E1122 Type 2 diabetes mellitus with diabetic chronic kidney disease: Secondary | ICD-10-CM | POA: Diagnosis not present

## 2015-04-01 DIAGNOSIS — E119 Type 2 diabetes mellitus without complications: Secondary | ICD-10-CM | POA: Diagnosis not present

## 2015-04-01 DIAGNOSIS — N186 End stage renal disease: Secondary | ICD-10-CM | POA: Diagnosis not present

## 2015-04-01 DIAGNOSIS — I120 Hypertensive chronic kidney disease with stage 5 chronic kidney disease or end stage renal disease: Secondary | ICD-10-CM | POA: Diagnosis not present

## 2015-04-08 DIAGNOSIS — F4323 Adjustment disorder with mixed anxiety and depressed mood: Secondary | ICD-10-CM | POA: Diagnosis not present

## 2015-04-13 ENCOUNTER — Other Ambulatory Visit: Payer: Self-pay

## 2015-04-13 DIAGNOSIS — Z01810 Encounter for preprocedural cardiovascular examination: Secondary | ICD-10-CM

## 2015-04-20 DIAGNOSIS — T82858D Stenosis of vascular prosthetic devices, implants and grafts, subsequent encounter: Secondary | ICD-10-CM | POA: Diagnosis not present

## 2015-04-20 DIAGNOSIS — N186 End stage renal disease: Secondary | ICD-10-CM | POA: Diagnosis not present

## 2015-04-20 DIAGNOSIS — Z992 Dependence on renal dialysis: Secondary | ICD-10-CM | POA: Diagnosis not present

## 2015-04-20 DIAGNOSIS — I871 Compression of vein: Secondary | ICD-10-CM | POA: Diagnosis not present

## 2015-04-22 DIAGNOSIS — F4323 Adjustment disorder with mixed anxiety and depressed mood: Secondary | ICD-10-CM | POA: Diagnosis not present

## 2015-04-26 ENCOUNTER — Encounter: Payer: Self-pay | Admitting: Vascular Surgery

## 2015-04-26 DIAGNOSIS — E1129 Type 2 diabetes mellitus with other diabetic kidney complication: Secondary | ICD-10-CM | POA: Diagnosis not present

## 2015-04-26 DIAGNOSIS — Z992 Dependence on renal dialysis: Secondary | ICD-10-CM | POA: Diagnosis not present

## 2015-04-26 DIAGNOSIS — N186 End stage renal disease: Secondary | ICD-10-CM | POA: Diagnosis not present

## 2015-04-28 DIAGNOSIS — N186 End stage renal disease: Secondary | ICD-10-CM | POA: Diagnosis not present

## 2015-04-28 DIAGNOSIS — D509 Iron deficiency anemia, unspecified: Secondary | ICD-10-CM | POA: Diagnosis not present

## 2015-04-28 DIAGNOSIS — E1129 Type 2 diabetes mellitus with other diabetic kidney complication: Secondary | ICD-10-CM | POA: Diagnosis not present

## 2015-04-28 DIAGNOSIS — N2581 Secondary hyperparathyroidism of renal origin: Secondary | ICD-10-CM | POA: Diagnosis not present

## 2015-05-04 ENCOUNTER — Ambulatory Visit (INDEPENDENT_AMBULATORY_CARE_PROVIDER_SITE_OTHER): Payer: Medicare Other | Admitting: Vascular Surgery

## 2015-05-04 ENCOUNTER — Ambulatory Visit (HOSPITAL_COMMUNITY)
Admission: RE | Admit: 2015-05-04 | Discharge: 2015-05-04 | Disposition: A | Discharge status: Home / Self Care | Payer: Medicare Other | Source: Ambulatory Visit | Attending: Vascular Surgery | Admitting: Vascular Surgery

## 2015-05-04 ENCOUNTER — Encounter: Payer: Self-pay | Admitting: Vascular Surgery

## 2015-05-04 ENCOUNTER — Ambulatory Visit (INDEPENDENT_AMBULATORY_CARE_PROVIDER_SITE_OTHER)
Admission: RE | Admit: 2015-05-04 | Discharge: 2015-05-04 | Disposition: A | Discharge status: Home / Self Care | Payer: Medicare Other | Source: Ambulatory Visit | Attending: Vascular Surgery | Admitting: Vascular Surgery

## 2015-05-04 VITALS — BP 147/82 | HR 92 | Temp 97.7°F | Resp 14 | Ht 62.0 in | Wt 165.0 lb

## 2015-05-04 DIAGNOSIS — M79603 Pain in arm, unspecified: Secondary | ICD-10-CM | POA: Diagnosis present

## 2015-05-04 DIAGNOSIS — Z01810 Encounter for preprocedural cardiovascular examination: Secondary | ICD-10-CM | POA: Diagnosis not present

## 2015-05-04 DIAGNOSIS — E119 Type 2 diabetes mellitus without complications: Secondary | ICD-10-CM | POA: Insufficient documentation

## 2015-05-04 DIAGNOSIS — N184 Chronic kidney disease, stage 4 (severe): Secondary | ICD-10-CM | POA: Diagnosis not present

## 2015-05-04 DIAGNOSIS — I10 Essential (primary) hypertension: Secondary | ICD-10-CM | POA: Diagnosis not present

## 2015-05-04 NOTE — Progress Notes (Signed)
Vascular and Vein Specialist of [facility]  Patient name: Morgan [name] MRN: [id number] DOB: [birth year] Sex: female  REASON FOR VISIT: To evaluate for new access. Referred by Dr. Jamal Maes.  HPI: Morgan [name] is a 45 y.o. female who dialyzes on Tuesdays Thursdays and Saturdays. She's had her left upper arm graft since 2012. She states that the lateral aspect of the graft, which is the venous side, has become degenerative according to Dr. Augustin Coupe. We have been asked to replace the lateral half, venous half of the graft. Of note she states that the graft has been working well. She has had a previous axillary stent done elsewhere.  I had seen her in consultation in July 2016 with a partially clotted graft. She had been having low flow rates on dialysis. She had a left basilic vein transposition in March 2011. She subsequently had a graft placed in the left upper arm in June 2012. She has had a previous left axillary stent placed at [facility].  Past Medical History  Diagnosis Date  . Diabetes mellitus without complication ([place])   . Renal disorder   . Hypertension   . Pneumonia     Family History  Problem Relation Age of Onset  . Diabetes Maternal Grandmother     SOCIAL HISTORY: Social History  Substance Use Topics  . Smoking status: Never Smoker   . Smokeless tobacco: Never Used  . Alcohol Use: No    Allergies  Allergen Reactions  . Iohexol Hives, Itching and Swelling     Code: HIVES, Desc: ITCHING, HIVES, SWELLING- 13 HR PRE-MEDS REQUIRED- ASM, Onset Date: [id number]   . Latex Other (See Comments)    unknown    Current Outpatient Prescriptions  Medication Sig Dispense Refill  . aspirin 81 MG tablet Take 81 mg by mouth daily.    . calcium acetate (PHOSLO) 667 MG capsule Take 667 mg by mouth 3 (three) times daily with meals.     . cinacalcet (SENSIPAR) 60 MG tablet Take 60 mg by mouth daily.    . clopidogrel (PLAVIX) 75 MG tablet Take 75 mg by  mouth daily.    Marland Kitchen glucose blood (ONE TOUCH ULTRA TEST) test strip 1 each by Other route 2 (two) times daily. And lancets 2/day 100 each 12  . insulin lispro (HUMALOG KWIKPEN) 100 UNIT/ML KiwkPen Inject 0.1 mLs (10 Units total) into the skin 3 (three) times daily with meals. And pen needles 3/day 15 mL 11  . Insulin Pen Needle (DROPLET PEN NEEDLES) 31G X 5 MM MISC Use to inject insulin 3 times per day 300 each 2  . levonorgestrel (MIRENA) 20 MCG/24HR IUD 1 each by Intrauterine route once.    . lidocaine-prilocaine (EMLA) cream Apply 1 application topically daily as needed (dialysis).     . pioglitazone (ACTOS) 15 MG tablet Take 15 mg by mouth daily.    . sevelamer carbonate (RENVELA) 800 MG tablet Take 4,000-4,800 mg by mouth 3 (three) times daily with meals.     Marland Kitchen albuterol (PROVENTIL HFA;VENTOLIN HFA) 108 (90 BASE) MCG/ACT inhaler Inhale 1-2 puffs into the lungs every 6 (six) hours as needed for wheezing or shortness of breath. Reported on 05/04/2015    . B Complex-C-Folic Acid (DIALYVITE TABLET) TABS Take 1 tablet by mouth daily at 12 noon. Reported on 05/04/2015    . oxyCODONE-acetaminophen (PERCOCET/ROXICET) 5-325 MG per tablet Take 1 tablet by mouth every 6 (six) hours as needed for severe pain. (Patient not taking: Reported  on 05/04/2015) 10 tablet 0   No current facility-administered medications for this visit.    REVIEW OF SYSTEMS:  '[X]'  denotes positive finding, '[ ]'  denotes negative finding Cardiac  Comments:  Chest pain or chest pressure:    Shortness of breath upon exertion:    Short of breath when lying flat:    Irregular heart rhythm:        Vascular    Pain in calf, thigh, or hip brought on by ambulation:    Pain in feet at night that wakes you up from your sleep:     Blood clot in your veins:    Leg swelling:         Pulmonary    Oxygen at home:    Productive cough:     Wheezing:         Neurologic    Sudden weakness in arms or legs:     Sudden numbness in arms or legs:      Sudden onset of difficulty speaking or slurred speech:    Temporary loss of vision in one eye:     Problems with dizziness:         Gastrointestinal    Blood in stool:     Vomited blood:         Genitourinary    Burning when urinating:     Blood in urine:        Psychiatric    Major depression:         Hematologic    Bleeding problems:    Problems with blood clotting too easily:        Skin    Rashes or ulcers:        Constitutional    Fever or chills:      PHYSICAL EXAM: Filed Vitals:   05/04/15 1002 05/04/15 1006  BP: 147/81 147/82  Pulse: 92 92  Temp: 97.7 F (36.5 C)   Resp: 14   Height: '5\' 2"'  (1.575 m)   Weight: 165 lb (74.844 kg)   SpO2: 100%     GENERAL: The patient is a well-nourished female, in no acute distress. The vital signs are documented above. CARDIAC: There is a regular rate and rhythm.  VASCULAR: her left upper arm graft has an excellent thrill and is not pulsatile. I cannot palpate a left radial pulse. PULMONARY: There is good air exchange bilaterally without wheezing or rales. ABDOMEN: Soft and non-tender with normal pitched bowel sounds.  MUSCULOSKELETAL: There are no major deformities or cyanosis. NEUROLOGIC: No focal weakness or paresthesias are detected. SKIN: There are no ulcers or rashes noted. PSYCHIATRIC: The patient has a normal affect.  DATA:   UPPER EXTREMITY VEIN MAP: I have independently interpreted her right upper extremity vein map. The forearm and upper arm cephalic vein on the right do not appear adequate. The basilic vein is also marginal in size.  UPPER EXTREMITY ARTERIAL DUPLEX: I have independently interpreted her upper Sherry arterial duplex. She has triphasic Doppler signals in the radial and ulnar positions on the right.  MEDICAL ISSUES:  DEGENERATIVE LEFT UPPER ARM GRAFT: I would agree that it would be reasonable to replace the venous half of the graft. I think the axillary stent is patent given that the graft is  not pulsatile. I have scheduled her for replacement of the venous half of the graft, along the lateral aspect of the upper arm, on 05/13/2015. She dialyzes on Tuesdays Thursdays and Saturdays and this is Friday, a nondialysis  day. We have discussed the procedure potential risks and she is agreeable to proceed.  HYPERTENSION: The patient's initial blood pressure today was elevated. We repeated this and this was still elevated. We have encouraged the patient to follow up with their primary care physician for management of their blood pressure.   Deitra Mayo Vascular and Vein Specialists of [place]: [phone]

## 2015-05-04 NOTE — Progress Notes (Signed)
Filed Vitals:   05/04/15 1002 05/04/15 1006  BP: 147/81 147/82  Pulse: 92 92  Temp: 97.7 F (36.5 C)   Resp: 14   Height: 5\' 2"  (1.575 m)   Weight: 165 lb (74.844 kg)   SpO2: 100%

## 2015-05-06 ENCOUNTER — Ambulatory Visit: Payer: Self-pay | Admitting: Endocrinology

## 2015-05-06 DIAGNOSIS — F4323 Adjustment disorder with mixed anxiety and depressed mood: Secondary | ICD-10-CM | POA: Diagnosis not present

## 2015-05-10 ENCOUNTER — Other Ambulatory Visit: Payer: Self-pay

## 2015-05-11 ENCOUNTER — Encounter (HOSPITAL_COMMUNITY): Payer: Self-pay | Admitting: *Deleted

## 2015-05-11 NOTE — Progress Notes (Addendum)
Pt denies cardiac history, chest pain or sob. Pt is diabetic. Instructed pt to check her blood sugar every 2 hours prior to leaving for surgery on Friday. Instructed pt if blood sugar is 70 or less, to treat it with 1/2 cup (4 oz) of clear juice (apple or cranberry) and to recheck blood sugar 15 minutes after drinking juice. If still 70 or below, instructed pt to call Short Stay (number given to pt) and ask to speak to a nurse. Pt voiced understanding. These instructions given per Diabetes Medication Adjustment Guidelines Prior to Procedure and Surgery.  Echo/Stress - 2013 - in Care Everywhere Stress test - 06/06/14 - in EPIC EKG - 10/06/14 - in [facility]

## 2015-05-12 MED ORDER — DEXTROSE 5 % IV SOLN
1.5000 g | INTRAVENOUS | Status: AC
  Administered 2015-05-13: 1.5 g via INTRAVENOUS
  Filled 2015-05-12 (×2): qty 1.5

## 2015-05-12 MED ORDER — SODIUM CHLORIDE 0.9 % IV SOLN: INTRAVENOUS | Status: DC

## 2015-05-13 ENCOUNTER — Encounter (HOSPITAL_COMMUNITY): Payer: Self-pay | Admitting: *Deleted

## 2015-05-13 ENCOUNTER — Ambulatory Visit (HOSPITAL_COMMUNITY): Payer: Medicare Other | Admitting: Anesthesiology

## 2015-05-13 ENCOUNTER — Ambulatory Visit (HOSPITAL_COMMUNITY)
Admission: RE | Admit: 2015-05-13 | Discharge: 2015-05-13 | Disposition: A | Discharge status: Home / Self Care | Payer: Medicare Other | Source: Ambulatory Visit | Attending: Vascular Surgery | Admitting: Vascular Surgery

## 2015-05-13 ENCOUNTER — Encounter (HOSPITAL_COMMUNITY)
Admission: RE | Disposition: A | Discharge status: Home / Self Care | Payer: Self-pay | Source: Ambulatory Visit | Attending: Vascular Surgery

## 2015-05-13 DIAGNOSIS — N185 Chronic kidney disease, stage 5: Secondary | ICD-10-CM | POA: Insufficient documentation

## 2015-05-13 DIAGNOSIS — Z7902 Long term (current) use of antithrombotics/antiplatelets: Secondary | ICD-10-CM | POA: Insufficient documentation

## 2015-05-13 DIAGNOSIS — Z7982 Long term (current) use of aspirin: Secondary | ICD-10-CM | POA: Diagnosis not present

## 2015-05-13 DIAGNOSIS — Z992 Dependence on renal dialysis: Secondary | ICD-10-CM | POA: Insufficient documentation

## 2015-05-13 DIAGNOSIS — I120 Hypertensive chronic kidney disease with stage 5 chronic kidney disease or end stage renal disease: Secondary | ICD-10-CM | POA: Diagnosis not present

## 2015-05-13 DIAGNOSIS — T82898A Other specified complication of vascular prosthetic devices, implants and grafts, initial encounter: Secondary | ICD-10-CM | POA: Diagnosis not present

## 2015-05-13 DIAGNOSIS — Z79899 Other long term (current) drug therapy: Secondary | ICD-10-CM | POA: Diagnosis not present

## 2015-05-13 DIAGNOSIS — E1122 Type 2 diabetes mellitus with diabetic chronic kidney disease: Secondary | ICD-10-CM | POA: Insufficient documentation

## 2015-05-13 DIAGNOSIS — D649 Anemia, unspecified: Secondary | ICD-10-CM | POA: Diagnosis not present

## 2015-05-13 DIAGNOSIS — Z794 Long term (current) use of insulin: Secondary | ICD-10-CM | POA: Diagnosis not present

## 2015-05-13 DIAGNOSIS — Y832 Surgical operation with anastomosis, bypass or graft as the cause of abnormal reaction of the patient, or of later complication, without mention of misadventure at the time of the procedure: Secondary | ICD-10-CM | POA: Insufficient documentation

## 2015-05-13 DIAGNOSIS — N186 End stage renal disease: Secondary | ICD-10-CM | POA: Diagnosis not present

## 2015-05-13 HISTORY — DX: Type 2 diabetes mellitus with unspecified diabetic retinopathy without macular edema: E11.319

## 2015-05-13 HISTORY — DX: Anemia, unspecified: D64.9

## 2015-05-13 HISTORY — DX: Constipation, unspecified: K59.00

## 2015-05-13 HISTORY — DX: Personal history of other diseases of the nervous system and sense organs: Z86.69

## 2015-05-13 HISTORY — PX: REVISION OF ARTERIOVENOUS GORETEX GRAFT: SHX6073

## 2015-05-13 HISTORY — DX: Personal history of other diseases of the nervous system and sense organs: Z98.890

## 2015-05-13 LAB — POCT I-STAT 4, (NA,K, GLUC, HGB,HCT)
GLUCOSE: 221 mg/dL — AB (ref 65–99)
HEMATOCRIT: 47 % — AB (ref 36.0–46.0)
HEMOGLOBIN: 16 g/dL — AB (ref 12.0–15.0)
Potassium: 5.2 mmol/L — ABNORMAL HIGH (ref 3.5–5.1)
Sodium: 134 mmol/L — ABNORMAL LOW (ref 135–145)

## 2015-05-13 LAB — GLUCOSE, CAPILLARY
GLUCOSE-CAPILLARY: 197 mg/dL — AB (ref 65–99)
Glucose-Capillary: 174 mg/dL — ABNORMAL HIGH (ref 65–99)

## 2015-05-13 SURGERY — REVISION OF ARTERIOVENOUS GORETEX GRAFT
Anesthesia: General | Site: Arm Upper | Laterality: Left

## 2015-05-13 MED ORDER — SODIUM CHLORIDE 0.9 % IV SOLN
INTRAVENOUS | Status: DC | PRN
  Administered 2015-05-13: 500 mL

## 2015-05-13 MED ORDER — ONDANSETRON HCL 4 MG/2ML IJ SOLN: 4.0000 mg | Freq: Once | INTRAMUSCULAR | Status: DC | PRN

## 2015-05-13 MED ORDER — FENTANYL CITRATE (PF) 250 MCG/5ML IJ SOLN
INTRAMUSCULAR | Status: AC
  Filled 2015-05-13: qty 5

## 2015-05-13 MED ORDER — FENTANYL CITRATE (PF) 100 MCG/2ML IJ SOLN
INTRAMUSCULAR | Status: DC | PRN
  Administered 2015-05-13 (×2): 50 ug via INTRAVENOUS
  Administered 2015-05-13: 100 ug via INTRAVENOUS

## 2015-05-13 MED ORDER — PROTAMINE SULFATE 10 MG/ML IV SOLN
INTRAVENOUS | Status: DC | PRN
  Administered 2015-05-13: 30 mg via INTRAVENOUS

## 2015-05-13 MED ORDER — MIDAZOLAM HCL 2 MG/2ML IJ SOLN
INTRAMUSCULAR | Status: AC
  Filled 2015-05-13: qty 2

## 2015-05-13 MED ORDER — ONDANSETRON HCL 4 MG/2ML IJ SOLN
INTRAMUSCULAR | Status: DC | PRN
  Administered 2015-05-13: 4 mg via INTRAVENOUS

## 2015-05-13 MED ORDER — OXYCODONE HCL 5 MG PO TABS
ORAL_TABLET | ORAL | Status: AC
  Administered 2015-05-13: 5 mg via ORAL
  Filled 2015-05-13: qty 1

## 2015-05-13 MED ORDER — MIDAZOLAM HCL 5 MG/5ML IJ SOLN
INTRAMUSCULAR | Status: DC | PRN
  Administered 2015-05-13: 2 mg via INTRAVENOUS

## 2015-05-13 MED ORDER — OXYCODONE-ACETAMINOPHEN 5-325 MG PO TABS: 1.0000 | ORAL_TABLET | ORAL | Status: DC | PRN

## 2015-05-13 MED ORDER — OXYCODONE HCL 5 MG/5ML PO SOLN: 5.0000 mg | Freq: Once | ORAL | Status: AC | PRN

## 2015-05-13 MED ORDER — OXYCODONE HCL 5 MG PO TABS
5.0000 mg | ORAL_TABLET | Freq: Once | ORAL | Status: AC | PRN
  Administered 2015-05-13: 5 mg via ORAL

## 2015-05-13 MED ORDER — 0.9 % SODIUM CHLORIDE (POUR BTL) OPTIME
TOPICAL | Status: DC | PRN
  Administered 2015-05-13: 1000 mL

## 2015-05-13 MED ORDER — SODIUM CHLORIDE 0.9 % IV SOLN
INTRAVENOUS | Status: DC
  Administered 2015-05-13: 10:00:00 via INTRAVENOUS

## 2015-05-13 MED ORDER — SUCCINYLCHOLINE CHLORIDE 20 MG/ML IJ SOLN
INTRAMUSCULAR | Status: DC | PRN
  Administered 2015-05-13: 140 mg via INTRAVENOUS

## 2015-05-13 MED ORDER — PROPOFOL 10 MG/ML IV BOLUS
INTRAVENOUS | Status: DC | PRN
  Administered 2015-05-13: 50 mg via INTRAVENOUS
  Administered 2015-05-13: 150 mg via INTRAVENOUS

## 2015-05-13 MED ORDER — ONDANSETRON HCL 4 MG/2ML IJ SOLN
INTRAMUSCULAR | Status: AC
  Filled 2015-05-13: qty 2

## 2015-05-13 MED ORDER — PROPOFOL 10 MG/ML IV BOLUS
INTRAVENOUS | Status: AC
  Filled 2015-05-13: qty 20

## 2015-05-13 MED ORDER — CHLORHEXIDINE GLUCONATE CLOTH 2 % EX PADS: 6.0000 | MEDICATED_PAD | Freq: Once | CUTANEOUS | Status: DC

## 2015-05-13 MED ORDER — FENTANYL CITRATE (PF) 100 MCG/2ML IJ SOLN
25.0000 ug | INTRAMUSCULAR | Status: DC | PRN
  Administered 2015-05-13: 50 ug via INTRAVENOUS

## 2015-05-13 MED ORDER — LIDOCAINE HCL (CARDIAC) 20 MG/ML IV SOLN
INTRAVENOUS | Status: DC | PRN
  Administered 2015-05-13: 10 mg via INTRAVENOUS

## 2015-05-13 MED ORDER — LIDOCAINE HCL (CARDIAC) 20 MG/ML IV SOLN
INTRAVENOUS | Status: AC
  Filled 2015-05-13: qty 5

## 2015-05-13 MED ORDER — FENTANYL CITRATE (PF) 100 MCG/2ML IJ SOLN
INTRAMUSCULAR | Status: AC
  Administered 2015-05-13: 50 ug via INTRAVENOUS
  Filled 2015-05-13: qty 2

## 2015-05-13 MED ORDER — PHENYLEPHRINE HCL 10 MG/ML IJ SOLN
10.0000 mg | INTRAVENOUS | Status: DC | PRN
  Administered 2015-05-13: 30 ug/min via INTRAVENOUS

## 2015-05-13 MED ORDER — HEPARIN SODIUM (PORCINE) 1000 UNIT/ML IJ SOLN
INTRAMUSCULAR | Status: DC | PRN
  Administered 2015-05-13: 6000 [IU] via INTRAVENOUS

## 2015-05-13 SURGICAL SUPPLY — 29 items
CANISTER SUCTION 2500CC (MISCELLANEOUS) ×2 IMPLANT
CANNULA VESSEL 3MM 2 BLNT TIP (CANNULA) IMPLANT
CLIP TI MEDIUM 6 (CLIP) ×2 IMPLANT
CLIP TI WIDE RED SMALL 6 (CLIP) ×2 IMPLANT
DECANTER SPIKE VIAL GLASS SM (MISCELLANEOUS) ×2 IMPLANT
ELECT REM PT RETURN 9FT ADLT (ELECTROSURGICAL) ×2
ELECTRODE REM PT RTRN 9FT ADLT (ELECTROSURGICAL) ×1 IMPLANT
GLOVE BIO SURGEON STRL SZ7.5 (GLOVE) ×2 IMPLANT
GLOVE BIOGEL PI IND STRL 8 (GLOVE) ×1 IMPLANT
GLOVE BIOGEL PI INDICATOR 8 (GLOVE) ×1
GOWN STRL REUS W/ TWL LRG LVL3 (GOWN DISPOSABLE) ×3 IMPLANT
GOWN STRL REUS W/TWL LRG LVL3 (GOWN DISPOSABLE) ×6
GRAFT GORETEX STND 4X7 (Vascular Products) ×2 IMPLANT
GRAFT GORETEXSTD 4X7 (Vascular Products) IMPLANT
KIT BASIN OR (CUSTOM PROCEDURE TRAY) ×2 IMPLANT
KIT ROOM TURNOVER OR (KITS) ×2 IMPLANT
LIQUID BAND (GAUZE/BANDAGES/DRESSINGS) ×2 IMPLANT
NDL HYPO 25GX1X1/2 BEV (NEEDLE) ×1 IMPLANT
NEEDLE HYPO 25GX1X1/2 BEV (NEEDLE) ×2 IMPLANT
NS IRRIG 1000ML POUR BTL (IV SOLUTION) ×2 IMPLANT
PACK CV ACCESS (CUSTOM PROCEDURE TRAY) ×2 IMPLANT
PAD ARMBOARD 7.5X6 YLW CONV (MISCELLANEOUS) ×4 IMPLANT
SPONGE SURGIFOAM ABS GEL 100 (HEMOSTASIS) IMPLANT
SUT PROLENE 6 0 BV (SUTURE) ×4 IMPLANT
SUT VIC AB 3-0 SH 27 (SUTURE) ×4
SUT VIC AB 3-0 SH 27X BRD (SUTURE) ×2 IMPLANT
SUT VICRYL 4-0 PS2 18IN ABS (SUTURE) ×4 IMPLANT
UNDERPAD 30X30 INCONTINENT (UNDERPADS AND DIAPERS) ×2 IMPLANT
WATER STERILE IRR 1000ML POUR (IV SOLUTION) ×2 IMPLANT

## 2015-05-13 NOTE — Anesthesia Procedure Notes (Signed)
Procedure Name: Intubation Date/Time: 05/13/2015 12:27 PM Performed by: Scheryl Darter Pre-anesthesia Checklist: Patient identified, Emergency Drugs available, Suction available and Patient being monitored Patient Re-evaluated:Patient Re-evaluated prior to inductionOxygen Delivery Method: Circle System Utilized Preoxygenation: Pre-oxygenation with 100% oxygen Intubation Type: IV induction and Rapid sequence Ventilation: Oral airway inserted - appropriate to patient size Laryngoscope Size: Sabra Heck and 2 Grade View: Grade II Tube type: Oral Tube size: 7.0 mm Number of attempts: 1 Airway Equipment and Method: Stylet,  Oral airway and Bite block Placement Confirmation: ETT inserted through vocal cords under direct vision,  positive ETCO2 and breath sounds checked- equal and bilateral Secured at: 21 cm Tube secured with: Tape Dental Injury: Teeth and Oropharynx as per pre-operative assessment

## 2015-05-13 NOTE — Interval H&P Note (Signed)
History and Physical Interval Note:  05/13/2015 11:52 AM  Morgan [name]  has presented today for surgery, with the diagnosis of End Stage Renal Disease N18.6  The various methods of treatment have been discussed with the patient and family. After consideration of risks, benefits and other options for treatment, the patient has consented to  Procedure(s): REVISION OF ARTERIOVENOUS GORETEX GRAFT (Left) as a surgical intervention .  The patient's history has been reviewed, patient examined, no change in status, stable for surgery.  I have reviewed the patient's chart and labs.  Questions were answered to the patient's satisfaction.     Deitra Mayo

## 2015-05-13 NOTE — H&P (View-Only) (Signed)
Vascular and Vein Specialist of [facility]  Patient name: Morgan [name] MRN: [id number] DOB: November 04, 1970 Sex: female  REASON FOR VISIT: To evaluate for new access. Referred by Dr. Jamal Maes.  HPI: Morgan [name] is a 45 y.o. female who dialyzes on Tuesdays Thursdays and Saturdays. She's had her left upper arm graft since 2012. She states that the lateral aspect of the graft, which is the venous side, has become degenerative according to Dr. Augustin Coupe. We have been asked to replace the lateral half, venous half of the graft. Of note she states that the graft has been working well. She has had a previous axillary stent done elsewhere.  I had seen her in consultation in July 2016 with a partially clotted graft. She had been having low flow rates on dialysis. She had a left basilic vein transposition in March 2011. She subsequently had a graft placed in the left upper arm in June 2012. She has had a previous left axillary stent placed at [facility].  Past Medical History  Diagnosis Date  . Diabetes mellitus without complication ([place])   . Renal disorder   . Hypertension   . Pneumonia     Family History  Problem Relation Age of Onset  . Diabetes Maternal Grandmother     SOCIAL HISTORY: Social History  Substance Use Topics  . Smoking status: Never Smoker   . Smokeless tobacco: Never Used  . Alcohol Use: No    Allergies  Allergen Reactions  . Iohexol Hives, Itching and Swelling     Code: HIVES, Desc: ITCHING, HIVES, SWELLING- 13 HR PRE-MEDS REQUIRED- ASM, Onset Date: [id number]   . Latex Other (See Comments)    unknown    Current Outpatient Prescriptions  Medication Sig Dispense Refill  . aspirin 81 MG tablet Take 81 mg by mouth daily.    . calcium acetate (PHOSLO) 667 MG capsule Take 667 mg by mouth 3 (three) times daily with meals.     . cinacalcet (SENSIPAR) 60 MG tablet Take 60 mg by mouth daily.    . clopidogrel (PLAVIX) 75 MG tablet Take 75 mg by  mouth daily.    Marland Kitchen glucose blood (ONE TOUCH ULTRA TEST) test strip 1 each by Other route 2 (two) times daily. And lancets 2/day 100 each 12  . insulin lispro (HUMALOG KWIKPEN) 100 UNIT/ML KiwkPen Inject 0.1 mLs (10 Units total) into the skin 3 (three) times daily with meals. And pen needles 3/day 15 mL 11  . Insulin Pen Needle (DROPLET PEN NEEDLES) 31G X 5 MM MISC Use to inject insulin 3 times per day 300 each 2  . levonorgestrel (MIRENA) 20 MCG/24HR IUD 1 each by Intrauterine route once.    . lidocaine-prilocaine (EMLA) cream Apply 1 application topically daily as needed (dialysis).     . pioglitazone (ACTOS) 15 MG tablet Take 15 mg by mouth daily.    . sevelamer carbonate (RENVELA) 800 MG tablet Take 4,000-4,800 mg by mouth 3 (three) times daily with meals.     Marland Kitchen albuterol (PROVENTIL HFA;VENTOLIN HFA) 108 (90 BASE) MCG/ACT inhaler Inhale 1-2 puffs into the lungs every 6 (six) hours as needed for wheezing or shortness of breath. Reported on 05/04/2015    . B Complex-C-Folic Acid (DIALYVITE TABLET) TABS Take 1 tablet by mouth daily at 12 noon. Reported on 05/04/2015    . oxyCODONE-acetaminophen (PERCOCET/ROXICET) 5-325 MG per tablet Take 1 tablet by mouth every 6 (six) hours as needed for severe pain. (Patient not taking: Reported  on 05/04/2015) 10 tablet 0   No current facility-administered medications for this visit.    REVIEW OF SYSTEMS:  '[X]'  denotes positive finding, '[ ]'  denotes negative finding Cardiac  Comments:  Chest pain or chest pressure:    Shortness of breath upon exertion:    Short of breath when lying flat:    Irregular heart rhythm:        Vascular    Pain in calf, thigh, or hip brought on by ambulation:    Pain in feet at night that wakes you up from your sleep:     Blood clot in your veins:    Leg swelling:         Pulmonary    Oxygen at home:    Productive cough:     Wheezing:         Neurologic    Sudden weakness in arms or legs:     Sudden numbness in arms or legs:      Sudden onset of difficulty speaking or slurred speech:    Temporary loss of vision in one eye:     Problems with dizziness:         Gastrointestinal    Blood in stool:     Vomited blood:         Genitourinary    Burning when urinating:     Blood in urine:        Psychiatric    Major depression:         Hematologic    Bleeding problems:    Problems with blood clotting too easily:        Skin    Rashes or ulcers:        Constitutional    Fever or chills:      PHYSICAL EXAM: Filed Vitals:   05/04/15 1002 05/04/15 1006  BP: 147/81 147/82  Pulse: 92 92  Temp: 97.7 F (36.5 C)   Resp: 14   Height: '5\' 2"'  (1.575 m)   Weight: 165 lb (74.844 kg)   SpO2: 100%     GENERAL: The patient is a well-nourished female, in no acute distress. The vital signs are documented above. CARDIAC: There is a regular rate and rhythm.  VASCULAR: her left upper arm graft has an excellent thrill and is not pulsatile. I cannot palpate a left radial pulse. PULMONARY: There is good air exchange bilaterally without wheezing or rales. ABDOMEN: Soft and non-tender with normal pitched bowel sounds.  MUSCULOSKELETAL: There are no major deformities or cyanosis. NEUROLOGIC: No focal weakness or paresthesias are detected. SKIN: There are no ulcers or rashes noted. PSYCHIATRIC: The patient has a normal affect.  DATA:   UPPER EXTREMITY VEIN MAP: I have independently interpreted her right upper extremity vein map. The forearm and upper arm cephalic vein on the right do not appear adequate. The basilic vein is also marginal in size.  UPPER EXTREMITY ARTERIAL DUPLEX: I have independently interpreted her upper Sherry arterial duplex. She has triphasic Doppler signals in the radial and ulnar positions on the right.  MEDICAL ISSUES:  DEGENERATIVE LEFT UPPER ARM GRAFT: I would agree that it would be reasonable to replace the venous half of the graft. I think the axillary stent is patent given that the graft is  not pulsatile. I have scheduled her for replacement of the venous half of the graft, along the lateral aspect of the upper arm, on 05/13/2015. She dialyzes on Tuesdays Thursdays and Saturdays and this is Friday, a nondialysis  day. We have discussed the procedure potential risks and she is agreeable to proceed.  HYPERTENSION: The patient's initial blood pressure today was elevated. We repeated this and this was still elevated. We have encouraged the patient to follow up with their primary care physician for management of their blood pressure.   Deitra Mayo Vascular and Vein Specialists of [place]: [phone]

## 2015-05-13 NOTE — Anesthesia Postprocedure Evaluation (Signed)
Anesthesia Post Note  Patient: Morgan [name] [id number]  Procedure(s) Performed: Procedure(s) (LRB): REVISION OF ARTERIOVENOUS GORETEX GRAFT (Left)  Patient location during evaluation: PACU Anesthesia Type: General Level of consciousness: awake, awake and alert and oriented Pain management: pain level controlled Vital Signs Assessment: post-procedure vital signs reviewed and stable Respiratory status: spontaneous breathing, nonlabored ventilation and respiratory function stable Cardiovascular status: blood pressure returned to baseline Anesthetic complications: no    Last Vitals:  Filed Vitals:   05/13/15 1440 05/13/15 1455  BP: 113/58 114/57  Pulse: 88 88  Temp:  36.7 C  Resp: 11 13    Last Pain:  Filed Vitals:   05/13/15 1534  PainSc: 6                  Falisa Lamora COKER

## 2015-05-13 NOTE — Anesthesia Preprocedure Evaluation (Signed)

## 2015-05-13 NOTE — Op Note (Signed)
    NAME: Morgan [name]  MRN: [id number] DOB: [birth year]    DATE OF OPERATION: 05/13/2015  PREOP DIAGNOSIS: Stage V chronic kidney disease  POSTOP DIAGNOSIS: Same  PROCEDURE: Revision of left upper arm loop AV graft (replaced lateral/venous half of graft)  SURGEON: Judeth Cornfield. Scot Dock, MD, FACS  ASSIST: Silva Bandy, [facility]  ANESTHESIA: Gen.   EBL: minimal  INDICATIONS: Morgan [name] is a 45 y.o. female who has undergone a fistulogram which showed significant degeneration of the lateral aspect of her left upper arm loop AV graft. This was the venous half of the graft. She presents for replacement of the lateral half of the graft.  FINDINGS: Good thrill at the completion of the procedure.  TECHNIQUE: The patient was taken to the operating room and seated general anesthetic. The left upper extremity was prepped and draped in usual sterile fashion. An incision was made over the venous limb of the graft just below the shoulder where the graft here was dissected free. A second incision was made at the distal loop of the graft where the graft was dissected free. A tunnel was then created lateral to the old graft and then the patient was heparinized. A 7 mm graft was tunneled between the 2 incisions. The patient was heparinized. The graft was clamped at both ends and then divided. The new segment of graft was sewn into and both ends using continuous 6-0 Prolene suture. At the completion was a good thrill in the graft. Hemostasis was obtained in the wounds. The heparin was partially reversed with protamine. Suture the wounds was closed with deeper 3-0 Vicryl and the skin closed with 4-0 Vicryl. Sterile dressing was applied and the patient tolerated the procedure well and was transferred to recovery room in stable condition. All needle and sponge counts were correct.  Deitra Mayo, MD, FACS Vascular and Vein Specialists of [facility]  DATE OF DICTATION:   05/13/2015

## 2015-05-14 NOTE — Transfer of Care (Signed)
Immediate Anesthesia Transfer of Care Note  Patient: Morgan [name] [id number]  Procedure(s) Performed: Procedure(s): REVISION OF ARTERIOVENOUS GORETEX GRAFT (Left)  Patient Location: PACU  Anesthesia Type:General  Level of Consciousness: awake, alert , oriented and sedated  Airway & Oxygen Therapy: Patient Spontanous Breathing and Patient connected to nasal cannula oxygen  Post-op Assessment: Report given to RN, Post -op Vital signs reviewed and stable and Patient moving all extremities  Post vital signs: Reviewed and stable  Last Vitals:  Filed Vitals:   05/13/15 1440 05/13/15 1455  BP: 113/58 114/57  Pulse: 88 88  Temp:  36.7 C  Resp: 11 13    Complications: No apparent anesthesia complications

## 2015-05-16 ENCOUNTER — Encounter (HOSPITAL_COMMUNITY): Payer: Self-pay | Admitting: Vascular Surgery

## 2015-05-17 ENCOUNTER — Telehealth: Payer: Self-pay | Admitting: Vascular Surgery

## 2015-05-17 NOTE — Telephone Encounter (Signed)
-----   Message from Mena Goes, RN sent at 05/13/2015  3:14 PM EDT ----- Regarding: No follow up No follow up needed  FYI  :) ----- Message -----    From: Angelia Mould, MD    Sent: 05/13/2015   2:00 PM      To: Vvs Charge Pool Subject: charge                                         PROCEDURE: Revision of left upper arm loop AV graft (replaced lateral/venous half of graft)  SURGEON: Judeth Cornfield. Scot Dock, MD, FACS  ASSIST: Silva Bandy, [facility]  F/u prn

## 2015-05-18 DIAGNOSIS — F4323 Adjustment disorder with mixed anxiety and depressed mood: Secondary | ICD-10-CM | POA: Diagnosis not present

## 2015-05-19 ENCOUNTER — Ambulatory Visit: Payer: Self-pay | Admitting: Endocrinology

## 2015-05-20 ENCOUNTER — Ambulatory Visit: Payer: Self-pay | Admitting: Endocrinology

## 2015-05-20 DIAGNOSIS — I739 Peripheral vascular disease, unspecified: Secondary | ICD-10-CM | POA: Diagnosis not present

## 2015-05-20 DIAGNOSIS — Z794 Long term (current) use of insulin: Secondary | ICD-10-CM | POA: Diagnosis not present

## 2015-05-20 DIAGNOSIS — I10 Essential (primary) hypertension: Secondary | ICD-10-CM | POA: Diagnosis not present

## 2015-05-20 DIAGNOSIS — Z6830 Body mass index (BMI) 30.0-30.9, adult: Secondary | ICD-10-CM | POA: Diagnosis not present

## 2015-05-20 DIAGNOSIS — E118 Type 2 diabetes mellitus with unspecified complications: Secondary | ICD-10-CM | POA: Diagnosis not present

## 2015-05-27 ENCOUNTER — Ambulatory Visit: Payer: Self-pay | Admitting: Endocrinology

## 2015-05-27 ENCOUNTER — Ambulatory Visit: Payer: Medicare Other | Admitting: Cardiovascular Disease

## 2015-05-27 DIAGNOSIS — E1129 Type 2 diabetes mellitus with other diabetic kidney complication: Secondary | ICD-10-CM | POA: Diagnosis not present

## 2015-05-27 DIAGNOSIS — Z992 Dependence on renal dialysis: Secondary | ICD-10-CM | POA: Diagnosis not present

## 2015-05-27 DIAGNOSIS — N186 End stage renal disease: Secondary | ICD-10-CM | POA: Diagnosis not present

## 2015-05-28 DIAGNOSIS — E1129 Type 2 diabetes mellitus with other diabetic kidney complication: Secondary | ICD-10-CM | POA: Diagnosis not present

## 2015-05-28 DIAGNOSIS — N186 End stage renal disease: Secondary | ICD-10-CM | POA: Diagnosis not present

## 2015-05-28 DIAGNOSIS — D509 Iron deficiency anemia, unspecified: Secondary | ICD-10-CM | POA: Diagnosis not present

## 2015-05-28 DIAGNOSIS — N2581 Secondary hyperparathyroidism of renal origin: Secondary | ICD-10-CM | POA: Diagnosis not present

## 2015-05-31 ENCOUNTER — Encounter: Payer: Self-pay | Admitting: *Deleted

## 2015-06-03 ENCOUNTER — Ambulatory Visit: Payer: Self-pay | Admitting: Endocrinology

## 2015-06-07 NOTE — Anesthesia Postprocedure Evaluation (Signed)
Anesthesia Post Note  Patient: Morgan [name] [id number]  Procedure(s) Performed: Procedure(s) (LRB): REVISION OF ARTERIOVENOUS GORETEX GRAFT (Left)  Patient location during evaluation: PACU Anesthesia Type: General Level of consciousness: awake, awake and alert and oriented Pain management: pain level controlled Vital Signs Assessment: post-procedure vital signs reviewed and stable Respiratory status: spontaneous breathing, nonlabored ventilation and respiratory function stable Cardiovascular status: blood pressure returned to baseline Anesthetic complications: no    Last Vitals:  Filed Vitals:   05/13/15 1440 05/13/15 1455  BP: 113/58 114/57  Pulse: 88 88  Temp:  36.7 C  Resp: 11 13    Last Pain:  Filed Vitals:   05/13/15 1534  PainSc: 6                  Clarke Amburn COKER

## 2015-06-22 DIAGNOSIS — I871 Compression of vein: Secondary | ICD-10-CM | POA: Diagnosis not present

## 2015-06-22 DIAGNOSIS — N186 End stage renal disease: Secondary | ICD-10-CM | POA: Diagnosis not present

## 2015-06-22 DIAGNOSIS — T82858D Stenosis of vascular prosthetic devices, implants and grafts, subsequent encounter: Secondary | ICD-10-CM | POA: Diagnosis not present

## 2015-06-22 DIAGNOSIS — Z992 Dependence on renal dialysis: Secondary | ICD-10-CM | POA: Diagnosis not present

## 2015-06-25 DIAGNOSIS — E1129 Type 2 diabetes mellitus with other diabetic kidney complication: Secondary | ICD-10-CM | POA: Diagnosis not present

## 2015-06-26 DIAGNOSIS — Z992 Dependence on renal dialysis: Secondary | ICD-10-CM | POA: Diagnosis not present

## 2015-06-26 DIAGNOSIS — N186 End stage renal disease: Secondary | ICD-10-CM | POA: Diagnosis not present

## 2015-06-26 DIAGNOSIS — E1129 Type 2 diabetes mellitus with other diabetic kidney complication: Secondary | ICD-10-CM | POA: Diagnosis not present

## 2015-06-28 DIAGNOSIS — N2581 Secondary hyperparathyroidism of renal origin: Secondary | ICD-10-CM | POA: Diagnosis not present

## 2015-06-28 DIAGNOSIS — E1129 Type 2 diabetes mellitus with other diabetic kidney complication: Secondary | ICD-10-CM | POA: Diagnosis not present

## 2015-06-28 DIAGNOSIS — N186 End stage renal disease: Secondary | ICD-10-CM | POA: Diagnosis not present

## 2015-06-30 DIAGNOSIS — N2581 Secondary hyperparathyroidism of renal origin: Secondary | ICD-10-CM | POA: Diagnosis not present

## 2015-06-30 DIAGNOSIS — E1129 Type 2 diabetes mellitus with other diabetic kidney complication: Secondary | ICD-10-CM | POA: Diagnosis not present

## 2015-06-30 DIAGNOSIS — N186 End stage renal disease: Secondary | ICD-10-CM | POA: Diagnosis not present

## 2015-07-02 DIAGNOSIS — E1129 Type 2 diabetes mellitus with other diabetic kidney complication: Secondary | ICD-10-CM | POA: Diagnosis not present

## 2015-07-02 DIAGNOSIS — N2581 Secondary hyperparathyroidism of renal origin: Secondary | ICD-10-CM | POA: Diagnosis not present

## 2015-07-02 DIAGNOSIS — N186 End stage renal disease: Secondary | ICD-10-CM | POA: Diagnosis not present

## 2015-07-04 ENCOUNTER — Encounter: Payer: Self-pay | Admitting: Endocrinology

## 2015-07-04 ENCOUNTER — Telehealth: Payer: Self-pay | Admitting: Endocrinology

## 2015-07-04 ENCOUNTER — Ambulatory Visit (INDEPENDENT_AMBULATORY_CARE_PROVIDER_SITE_OTHER): Payer: Medicare Other | Admitting: Endocrinology

## 2015-07-04 VITALS — BP 134/80 | HR 95 | Ht 62.0 in | Wt 167.8 lb

## 2015-07-04 DIAGNOSIS — E1122 Type 2 diabetes mellitus with diabetic chronic kidney disease: Secondary | ICD-10-CM | POA: Diagnosis not present

## 2015-07-04 DIAGNOSIS — Z992 Dependence on renal dialysis: Secondary | ICD-10-CM | POA: Diagnosis not present

## 2015-07-04 DIAGNOSIS — N186 End stage renal disease: Secondary | ICD-10-CM | POA: Diagnosis not present

## 2015-07-04 DIAGNOSIS — Z794 Long term (current) use of insulin: Secondary | ICD-10-CM | POA: Diagnosis not present

## 2015-07-04 MED ORDER — INSULIN LISPRO PROT & LISPRO (75-25 MIX) 100 UNIT/ML KWIKPEN: 30.0000 [IU] | PEN_INJECTOR | Freq: Every day | SUBCUTANEOUS | Status: DC

## 2015-07-04 NOTE — Telephone Encounter (Signed)
Is pt still to take actos and is there a discount card we could giver her for the new med you rx today

## 2015-07-04 NOTE — Telephone Encounter (Signed)
Pt wanted to verify if she should continue the actos along with the Humalog 75/25. Please advise, Thanks!

## 2015-07-04 NOTE — Progress Notes (Signed)
Subjective:    Patient ID: Morgan [name], female    DOB: April 10, 1970, 45 y.o.   MRN: [id number]  HPI  Pt returns for f/u of diabetes mellitus: DM type: Insulin-requiring type 2 Dx'ed: [id number] Complications: polyneuropathy, proliferative retinopathy, and ESRD Therapy: insulin since dx GDM: never DKA: never Severe hypoglycemia: never Pancreatitis: never Other: She took an intentional insulin overdose in 2014, in a suicide attempt; therapy limited by noncompliance.   Interval history:  Pt says she often misses the insulin. She wants to get back on the transplant list (she says she was taken off, due to depression).  pt states she feels well in general.   She urinates very little.  Past Medical History  Diagnosis Date  . Hypertension   . Pneumonia   . Diabetes mellitus without complication ([place])     type 2  . Renal disorder     dialysis on T/Th/Sa  . Anemia     low iron  . Constipation     when given iron  . Diabetic retinopathy ([place])   . H/O detached retina repair     bilateral    Past Surgical History  Procedure Laterality Date  . Cesarean section    . Eye surgery      retinal detachment  . Cholecystectomy N/A 10/08/2014    Procedure: LAPAROSCOPIC CHOLECYSTECTOMY ;  Surgeon: Erroll Luna, MD;  Location: [place];  Service: General;  Laterality: N/A;  . Basilic vein transposition Left 2011  . Arteriovenous graft placement Left 07/2010  . Revision of arteriovenous goretex graft Left 05/13/2015    Procedure: REVISION OF ARTERIOVENOUS GORETEX GRAFT;  Surgeon: Angelia Mould, MD;  Location: [facility];  Service: Vascular;  Laterality: Left;    Social History   Social History  . Marital Status: Married    Spouse Name: N/A  . Number of Children: N/A  . Years of Education: N/A   Occupational History  . Not on file.   Social History Main Topics  . Smoking status: Never Smoker   . Smokeless tobacco: Never Used  . Alcohol Use: No  . Drug Use: No  . Sexual  Activity: Not on file   Other Topics Concern  . Not on file   Social History Narrative    Current Outpatient Prescriptions on File Prior to Visit  Medication Sig Dispense Refill  . aspirin 81 MG tablet Take 81 mg by mouth daily.    . B Complex-C-Folic Acid (DIALYVITE TABLET) TABS Take 1 tablet by mouth daily at 12 noon. Reported on 05/04/2015    . cinacalcet (SENSIPAR) 60 MG tablet Take 60 mg by mouth daily.    . clopidogrel (PLAVIX) 75 MG tablet Take 75 mg by mouth daily.    Marland Kitchen glucose blood (ONE TOUCH ULTRA TEST) test strip 1 each by Other route 2 (two) times daily. And lancets 2/day 100 each 12  . Insulin Pen Needle (DROPLET PEN NEEDLES) 31G X 5 MM MISC Use to inject insulin 3 times per day 300 each 2  . levonorgestrel (MIRENA) 20 MCG/24HR IUD 1 each by Intrauterine route once.    . lidocaine-prilocaine (EMLA) cream Apply 1 application topically daily as needed (dialysis).     . pioglitazone (ACTOS) 15 MG tablet Take 15 mg by mouth daily.    . sevelamer carbonate (RENVELA) 800 MG tablet Take 4,000-4,800 mg by mouth 3 (three) times daily with meals.      No current facility-administered medications on file prior to visit.  Allergies  Allergen Reactions  . Fluorescein Shortness Of Breath and Other (See Comments)    Hypertension  . Iodinated Diagnostic Agents Anaphylaxis, Hives and Swelling    Diffuse swelling  . Iohexol Anaphylaxis, Hives, Itching and Swelling    13 HR PRE-MEDS REQUIRED  . Latex Itching    Family History  Problem Relation Age of Onset  . Diabetes Maternal Grandmother     BP 134/80 mmHg  Pulse 95  Ht 5\' 2"  (1.575 m)  Wt 167 lb 12.8 oz (76.114 kg)  BMI 30.68 kg/m2  SpO2 99%   Review of Systems She has lost 5 lbs in the past year.       Objective:   Physical Exam VITAL SIGNS:  See vs page.   GENERAL: no distress.  Pulses: dorsalis pedis intact bilat.   MSK: no deformity of the feet. CV: no leg edema.  Skin:  no ulcer on the feet, but the skin is  dry.  normal color and temp on the feet.  Neuro: sensation is intact to touch on the feet, but decreased from normal.      outside test results are reviewed: A1c=9.3%.     Assessment & Plan:  DM: worse ESRD: in this setting, she may need 50/50 insulin, so we'll follow the pattern of her cbg's Noncompliance with cbg recording, insulin, and f/u ov's: I'll work around this as best I can, which is qd insulin.   Patient is advised the following: Patient Instructions  i have sent a prescription to your pharmacy, to change the insulin to a once-a-day timed-release.   On this type of insulin schedule, you should eat meals on a regular schedule.  If a meal is missed or significantly delayed, your blood sugar could go low.   check your blood sugar twice a day.  vary the time of day when you check, between before the 3 meals, and at bedtime.  also check if you have symptoms of your blood sugar being too high or too low.  please keep a record of the readings and bring it to your next appointment here (or you can bring the meter itself).  You can write it on any piece of paper.  please call us sooner if your blood sugar goes below 70, or if you have a lot of readings over 200.

## 2015-07-04 NOTE — Telephone Encounter (Signed)
Yes, please continue.

## 2015-07-04 NOTE — Patient Instructions (Addendum)
i have sent a prescription to your pharmacy, to change the insulin to a once-a-day timed-release.   On this type of insulin schedule, you should eat meals on a regular schedule.  If a meal is missed or significantly delayed, your blood sugar could go low.   check your blood sugar twice a day.  vary the time of day when you check, between before the 3 meals, and at bedtime.  also check if you have symptoms of your blood sugar being too high or too low.  please keep a record of the readings and bring it to your next appointment here (or you can bring the meter itself).  You can write it on any piece of paper.  please call us sooner if your blood sugar goes below 70, or if you have a lot of readings over 200.

## 2015-07-04 NOTE — Telephone Encounter (Signed)
I contacted the pt and advised of instructions. She voiced understanding.

## 2015-07-05 DIAGNOSIS — E1129 Type 2 diabetes mellitus with other diabetic kidney complication: Secondary | ICD-10-CM | POA: Diagnosis not present

## 2015-07-05 DIAGNOSIS — N186 End stage renal disease: Secondary | ICD-10-CM | POA: Diagnosis not present

## 2015-07-05 DIAGNOSIS — N2581 Secondary hyperparathyroidism of renal origin: Secondary | ICD-10-CM | POA: Diagnosis not present

## 2015-07-06 DIAGNOSIS — F4323 Adjustment disorder with mixed anxiety and depressed mood: Secondary | ICD-10-CM | POA: Diagnosis not present

## 2015-07-07 DIAGNOSIS — N2581 Secondary hyperparathyroidism of renal origin: Secondary | ICD-10-CM | POA: Diagnosis not present

## 2015-07-07 DIAGNOSIS — E1129 Type 2 diabetes mellitus with other diabetic kidney complication: Secondary | ICD-10-CM | POA: Diagnosis not present

## 2015-07-07 DIAGNOSIS — N186 End stage renal disease: Secondary | ICD-10-CM | POA: Diagnosis not present

## 2015-07-09 DIAGNOSIS — N186 End stage renal disease: Secondary | ICD-10-CM | POA: Diagnosis not present

## 2015-07-09 DIAGNOSIS — N2581 Secondary hyperparathyroidism of renal origin: Secondary | ICD-10-CM | POA: Diagnosis not present

## 2015-07-09 DIAGNOSIS — E1129 Type 2 diabetes mellitus with other diabetic kidney complication: Secondary | ICD-10-CM | POA: Diagnosis not present

## 2015-07-12 DIAGNOSIS — E1129 Type 2 diabetes mellitus with other diabetic kidney complication: Secondary | ICD-10-CM | POA: Diagnosis not present

## 2015-07-12 DIAGNOSIS — N2581 Secondary hyperparathyroidism of renal origin: Secondary | ICD-10-CM | POA: Diagnosis not present

## 2015-07-12 DIAGNOSIS — N186 End stage renal disease: Secondary | ICD-10-CM | POA: Diagnosis not present

## 2015-07-14 DIAGNOSIS — E1129 Type 2 diabetes mellitus with other diabetic kidney complication: Secondary | ICD-10-CM | POA: Diagnosis not present

## 2015-07-14 DIAGNOSIS — N2581 Secondary hyperparathyroidism of renal origin: Secondary | ICD-10-CM | POA: Diagnosis not present

## 2015-07-14 DIAGNOSIS — N186 End stage renal disease: Secondary | ICD-10-CM | POA: Diagnosis not present

## 2015-07-15 DIAGNOSIS — N182 Chronic kidney disease, stage 2 (mild): Secondary | ICD-10-CM | POA: Diagnosis not present

## 2015-07-15 DIAGNOSIS — Z992 Dependence on renal dialysis: Secondary | ICD-10-CM | POA: Diagnosis not present

## 2015-07-15 DIAGNOSIS — T82858D Stenosis of vascular prosthetic devices, implants and grafts, subsequent encounter: Secondary | ICD-10-CM | POA: Diagnosis not present

## 2015-07-15 DIAGNOSIS — I871 Compression of vein: Secondary | ICD-10-CM | POA: Diagnosis not present

## 2015-07-16 DIAGNOSIS — N186 End stage renal disease: Secondary | ICD-10-CM | POA: Diagnosis not present

## 2015-07-16 DIAGNOSIS — N2581 Secondary hyperparathyroidism of renal origin: Secondary | ICD-10-CM | POA: Diagnosis not present

## 2015-07-16 DIAGNOSIS — E1129 Type 2 diabetes mellitus with other diabetic kidney complication: Secondary | ICD-10-CM | POA: Diagnosis not present

## 2015-07-19 DIAGNOSIS — N186 End stage renal disease: Secondary | ICD-10-CM | POA: Diagnosis not present

## 2015-07-19 DIAGNOSIS — N2581 Secondary hyperparathyroidism of renal origin: Secondary | ICD-10-CM | POA: Diagnosis not present

## 2015-07-19 DIAGNOSIS — E1129 Type 2 diabetes mellitus with other diabetic kidney complication: Secondary | ICD-10-CM | POA: Diagnosis not present

## 2015-07-20 ENCOUNTER — Ambulatory Visit (INDEPENDENT_AMBULATORY_CARE_PROVIDER_SITE_OTHER): Payer: Medicare Other | Admitting: Endocrinology

## 2015-07-20 ENCOUNTER — Encounter: Payer: Self-pay | Admitting: Endocrinology

## 2015-07-20 VITALS — BP 122/84 | HR 65 | Temp 98.4°F | Ht 62.0 in | Wt 165.0 lb

## 2015-07-20 DIAGNOSIS — N186 End stage renal disease: Secondary | ICD-10-CM

## 2015-07-20 DIAGNOSIS — Z992 Dependence on renal dialysis: Secondary | ICD-10-CM

## 2015-07-20 DIAGNOSIS — Z794 Long term (current) use of insulin: Secondary | ICD-10-CM

## 2015-07-20 DIAGNOSIS — E1122 Type 2 diabetes mellitus with diabetic chronic kidney disease: Secondary | ICD-10-CM

## 2015-07-20 MED ORDER — INSULIN NPH ISOPHANE & REGULAR (70-30) 100 UNIT/ML ~~LOC~~ SUSP: 30.0000 [IU] | Freq: Every day | SUBCUTANEOUS | Status: DC

## 2015-07-20 NOTE — Progress Notes (Signed)
Subjective:    Patient ID: Morgan [name], female    DOB: 07-31-1970, 45 y.o.   MRN: [id number]  HPI Pt returns for f/u of diabetes mellitus: DM type: Insulin-requiring type 2 Dx'ed: [id number] Complications: polyneuropathy, proliferative retinopathy, and ESRD.  Therapy: insulin since dx GDM: never DKA: never Severe hypoglycemia: never.  Pancreatitis: never Other: She took an intentional insulin overdose in 2014, in a suicide attempt; therapy has been limited by noncompliance.   Interval history:  She did not start 75/25, due to cost.  pt states she feels well in general.  Past Medical History  Diagnosis Date  . Hypertension   . Pneumonia   . Diabetes mellitus without complication ([place])     type 2  . Renal disorder     dialysis on T/Th/Sa  . Anemia     low iron  . Constipation     when given iron  . Diabetic retinopathy ([place])   . H/O detached retina repair     bilateral    Past Surgical History  Procedure Laterality Date  . Cesarean section    . Eye surgery      retinal detachment  . Cholecystectomy N/A 10/08/2014    Procedure: LAPAROSCOPIC CHOLECYSTECTOMY ;  Surgeon: Erroll Luna, MD;  Location: [place];  Service: General;  Laterality: N/A;  . Basilic vein transposition Left 2011  . Arteriovenous graft placement Left 07/2010  . Revision of arteriovenous goretex graft Left 05/13/2015    Procedure: REVISION OF ARTERIOVENOUS GORETEX GRAFT;  Surgeon: Angelia Mould, MD;  Location: [facility] OR;  Service: Vascular;  Laterality: Left;    Social History   Social History  . Marital Status: Married    Spouse Name: N/A  . Number of Children: N/A  . Years of Education: N/A   Occupational History  . Not on file.   Social History Main Topics  . Smoking status: Never Smoker   . Smokeless tobacco: Never Used  . Alcohol Use: No  . Drug Use: No  . Sexual Activity: Not on file   Other Topics Concern  . Not on file   Social History Narrative    Current  Outpatient Prescriptions on File Prior to Visit  Medication Sig Dispense Refill  . aspirin 81 MG tablet Take 81 mg by mouth daily.    . B Complex-C-Folic Acid (DIALYVITE TABLET) TABS Take 1 tablet by mouth daily at 12 noon. Reported on 05/04/2015    . cinacalcet (SENSIPAR) 60 MG tablet Take 60 mg by mouth daily.    . clopidogrel (PLAVIX) 75 MG tablet Take 75 mg by mouth daily.    Marland Kitchen glucose blood (ONE TOUCH ULTRA TEST) test strip 1 each by Other route 2 (two) times daily. And lancets 2/day 100 each 12  . Insulin Pen Needle (DROPLET PEN NEEDLES) 31G X 5 MM MISC Use to inject insulin 3 times per day 300 each 2  . levonorgestrel (MIRENA) 20 MCG/24HR IUD 1 each by Intrauterine route once.    . lidocaine-prilocaine (EMLA) cream Apply 1 application topically daily as needed (dialysis).     . pioglitazone (ACTOS) 15 MG tablet Take 15 mg by mouth daily.    . sevelamer carbonate (RENVELA) 800 MG tablet Take 800 mg by mouth 2 (two) times daily.      No current facility-administered medications on file prior to visit.    Allergies  Allergen Reactions  . Fluorescein Shortness Of Breath and Other (See Comments)    Hypertension  .  Iodinated Diagnostic Agents Anaphylaxis, Hives and Swelling    Diffuse swelling  . Iohexol Anaphylaxis, Hives, Itching and Swelling    13 HR PRE-MEDS REQUIRED  . Latex Itching    Family History  Problem Relation Age of Onset  . Diabetes Maternal Grandmother    BP 122/84 mmHg  Pulse 65  Temp(Src) 98.4 F (36.9 C) (Oral)  Ht 5\' 2"  (1.575 m)  Wt 165 lb (74.844 kg)  BMI 30.17 kg/m2  SpO2 96%  Review of Systems She denies hypoglycemia.     Objective:   Physical Exam VITAL SIGNS:  See vs page GENERAL: no distress Pulses: dorsalis pedis intact bilat.   MSK: no deformity of the feet CV: no leg edema Skin:  no ulcer on the feet.  normal color and temp on the feet. Neuro: sensation is intact to touch on the feet, but decreased from normal.   A1c=9.5%       Assessment & Plan:  DM: glycemic control is worse.    Noncompliance with insulin: I'll work around this as best I can.    Patient is advised the following: Patient Instructions  i have sent a prescription to your pharmacy, to change the insulin to a cheaper once-a-day time-release.   On this type of insulin schedule, you should eat meals on a regular schedule.  If a meal is missed or significantly delayed, your blood sugar could go low.   check your blood sugar twice a day.  vary the time of day when you check, between before the 3 meals, and at bedtime.  also check if you have symptoms of your blood sugar being too high or too low.  please keep a record of the readings and bring it to your next appointment here (or you can bring the meter itself).  You can write it on any piece of paper.  please call us sooner if your blood sugar goes below 70, or if you have a lot of readings over 200.   Please come back for a follow-up appointment in 2 weeks

## 2015-07-20 NOTE — Patient Instructions (Addendum)
i have sent a prescription to your pharmacy, to change the insulin to a cheaper once-a-day time-release.   On this type of insulin schedule, you should eat meals on a regular schedule.  If a meal is missed or significantly delayed, your blood sugar could go low.   check your blood sugar twice a day.  vary the time of day when you check, between before the 3 meals, and at bedtime.  also check if you have symptoms of your blood sugar being too high or too low.  please keep a record of the readings and bring it to your next appointment here (or you can bring the meter itself).  You can write it on any piece of paper.  please call us sooner if your blood sugar goes below 70, or if you have a lot of readings over 200.   Please come back for a follow-up appointment in 2 weeks

## 2015-07-21 DIAGNOSIS — E1129 Type 2 diabetes mellitus with other diabetic kidney complication: Secondary | ICD-10-CM | POA: Diagnosis not present

## 2015-07-21 DIAGNOSIS — N2581 Secondary hyperparathyroidism of renal origin: Secondary | ICD-10-CM | POA: Diagnosis not present

## 2015-07-21 DIAGNOSIS — N186 End stage renal disease: Secondary | ICD-10-CM | POA: Diagnosis not present

## 2015-07-23 DIAGNOSIS — E1129 Type 2 diabetes mellitus with other diabetic kidney complication: Secondary | ICD-10-CM | POA: Diagnosis not present

## 2015-07-23 DIAGNOSIS — N186 End stage renal disease: Secondary | ICD-10-CM | POA: Diagnosis not present

## 2015-07-23 DIAGNOSIS — N2581 Secondary hyperparathyroidism of renal origin: Secondary | ICD-10-CM | POA: Diagnosis not present

## 2015-07-26 DIAGNOSIS — N186 End stage renal disease: Secondary | ICD-10-CM | POA: Diagnosis not present

## 2015-07-26 DIAGNOSIS — E1129 Type 2 diabetes mellitus with other diabetic kidney complication: Secondary | ICD-10-CM | POA: Diagnosis not present

## 2015-07-26 DIAGNOSIS — N2581 Secondary hyperparathyroidism of renal origin: Secondary | ICD-10-CM | POA: Diagnosis not present

## 2015-07-27 DIAGNOSIS — N186 End stage renal disease: Secondary | ICD-10-CM | POA: Diagnosis not present

## 2015-07-27 DIAGNOSIS — Z992 Dependence on renal dialysis: Secondary | ICD-10-CM | POA: Diagnosis not present

## 2015-07-27 DIAGNOSIS — E1129 Type 2 diabetes mellitus with other diabetic kidney complication: Secondary | ICD-10-CM | POA: Diagnosis not present

## 2015-07-28 DIAGNOSIS — N2581 Secondary hyperparathyroidism of renal origin: Secondary | ICD-10-CM | POA: Diagnosis not present

## 2015-07-28 DIAGNOSIS — N186 End stage renal disease: Secondary | ICD-10-CM | POA: Diagnosis not present

## 2015-07-28 DIAGNOSIS — E1129 Type 2 diabetes mellitus with other diabetic kidney complication: Secondary | ICD-10-CM | POA: Diagnosis not present

## 2015-08-03 ENCOUNTER — Ambulatory Visit: Payer: Self-pay | Admitting: Endocrinology

## 2015-08-03 DIAGNOSIS — Z0289 Encounter for other administrative examinations: Secondary | ICD-10-CM

## 2015-08-09 DIAGNOSIS — F4323 Adjustment disorder with mixed anxiety and depressed mood: Secondary | ICD-10-CM | POA: Diagnosis not present

## 2015-08-26 DIAGNOSIS — Z992 Dependence on renal dialysis: Secondary | ICD-10-CM | POA: Diagnosis not present

## 2015-08-26 DIAGNOSIS — E1129 Type 2 diabetes mellitus with other diabetic kidney complication: Secondary | ICD-10-CM | POA: Diagnosis not present

## 2015-08-26 DIAGNOSIS — N186 End stage renal disease: Secondary | ICD-10-CM | POA: Diagnosis not present

## 2015-08-27 DIAGNOSIS — N186 End stage renal disease: Secondary | ICD-10-CM | POA: Diagnosis not present

## 2015-08-27 DIAGNOSIS — E1129 Type 2 diabetes mellitus with other diabetic kidney complication: Secondary | ICD-10-CM | POA: Diagnosis not present

## 2015-08-27 DIAGNOSIS — N2581 Secondary hyperparathyroidism of renal origin: Secondary | ICD-10-CM | POA: Diagnosis not present

## 2015-08-27 DIAGNOSIS — D631 Anemia in chronic kidney disease: Secondary | ICD-10-CM | POA: Diagnosis not present

## 2015-09-05 DIAGNOSIS — I871 Compression of vein: Secondary | ICD-10-CM | POA: Diagnosis not present

## 2015-09-05 DIAGNOSIS — F4323 Adjustment disorder with mixed anxiety and depressed mood: Secondary | ICD-10-CM | POA: Diagnosis not present

## 2015-09-05 DIAGNOSIS — N182 Chronic kidney disease, stage 2 (mild): Secondary | ICD-10-CM | POA: Diagnosis not present

## 2015-09-05 DIAGNOSIS — T82858D Stenosis of vascular prosthetic devices, implants and grafts, subsequent encounter: Secondary | ICD-10-CM | POA: Diagnosis not present

## 2015-09-05 DIAGNOSIS — Z992 Dependence on renal dialysis: Secondary | ICD-10-CM | POA: Diagnosis not present

## 2015-09-08 DIAGNOSIS — D631 Anemia in chronic kidney disease: Secondary | ICD-10-CM | POA: Diagnosis not present

## 2015-09-08 DIAGNOSIS — N186 End stage renal disease: Secondary | ICD-10-CM | POA: Diagnosis not present

## 2015-09-08 DIAGNOSIS — Z4931 Encounter for adequacy testing for hemodialysis: Secondary | ICD-10-CM | POA: Diagnosis not present

## 2015-09-16 DIAGNOSIS — E083523 Diabetes mellitus due to underlying condition with proliferative diabetic retinopathy with traction retinal detachment involving the macula, bilateral: Secondary | ICD-10-CM | POA: Diagnosis not present

## 2015-09-16 DIAGNOSIS — Z794 Long term (current) use of insulin: Secondary | ICD-10-CM | POA: Diagnosis not present

## 2015-09-16 DIAGNOSIS — Z01818 Encounter for other preprocedural examination: Secondary | ICD-10-CM | POA: Diagnosis not present

## 2015-09-16 DIAGNOSIS — Z114 Encounter for screening for human immunodeficiency virus [HIV]: Secondary | ICD-10-CM | POA: Diagnosis not present

## 2015-09-16 DIAGNOSIS — Z7682 Awaiting organ transplant status: Secondary | ICD-10-CM | POA: Diagnosis not present

## 2015-09-16 DIAGNOSIS — E1165 Type 2 diabetes mellitus with hyperglycemia: Secondary | ICD-10-CM | POA: Diagnosis not present

## 2015-09-16 DIAGNOSIS — N2581 Secondary hyperparathyroidism of renal origin: Secondary | ICD-10-CM | POA: Diagnosis not present

## 2015-09-16 DIAGNOSIS — Z992 Dependence on renal dialysis: Secondary | ICD-10-CM | POA: Diagnosis not present

## 2015-09-16 DIAGNOSIS — E1122 Type 2 diabetes mellitus with diabetic chronic kidney disease: Secondary | ICD-10-CM | POA: Diagnosis not present

## 2015-09-16 DIAGNOSIS — N186 End stage renal disease: Secondary | ICD-10-CM | POA: Diagnosis not present

## 2015-09-21 DIAGNOSIS — Z1231 Encounter for screening mammogram for malignant neoplasm of breast: Secondary | ICD-10-CM | POA: Diagnosis not present

## 2015-09-21 DIAGNOSIS — Z124 Encounter for screening for malignant neoplasm of cervix: Secondary | ICD-10-CM | POA: Diagnosis not present

## 2015-09-22 DIAGNOSIS — E1129 Type 2 diabetes mellitus with other diabetic kidney complication: Secondary | ICD-10-CM | POA: Diagnosis not present

## 2015-09-26 DIAGNOSIS — Z992 Dependence on renal dialysis: Secondary | ICD-10-CM | POA: Diagnosis not present

## 2015-09-26 DIAGNOSIS — N186 End stage renal disease: Secondary | ICD-10-CM | POA: Diagnosis not present

## 2015-09-26 DIAGNOSIS — E1129 Type 2 diabetes mellitus with other diabetic kidney complication: Secondary | ICD-10-CM | POA: Diagnosis not present

## 2015-09-27 DIAGNOSIS — I739 Peripheral vascular disease, unspecified: Secondary | ICD-10-CM | POA: Diagnosis not present

## 2015-09-27 DIAGNOSIS — N186 End stage renal disease: Secondary | ICD-10-CM | POA: Diagnosis not present

## 2015-09-27 DIAGNOSIS — N2581 Secondary hyperparathyroidism of renal origin: Secondary | ICD-10-CM | POA: Diagnosis not present

## 2015-09-27 DIAGNOSIS — D631 Anemia in chronic kidney disease: Secondary | ICD-10-CM | POA: Diagnosis not present

## 2015-09-27 DIAGNOSIS — E1129 Type 2 diabetes mellitus with other diabetic kidney complication: Secondary | ICD-10-CM | POA: Diagnosis not present

## 2015-09-28 DIAGNOSIS — T8332XA Displacement of intrauterine contraceptive device, initial encounter: Secondary | ICD-10-CM | POA: Diagnosis not present

## 2015-09-28 DIAGNOSIS — N83209 Unspecified ovarian cyst, unspecified side: Secondary | ICD-10-CM | POA: Diagnosis not present

## 2015-09-29 DIAGNOSIS — E1129 Type 2 diabetes mellitus with other diabetic kidney complication: Secondary | ICD-10-CM | POA: Diagnosis not present

## 2015-09-29 DIAGNOSIS — D631 Anemia in chronic kidney disease: Secondary | ICD-10-CM | POA: Diagnosis not present

## 2015-09-29 DIAGNOSIS — N2581 Secondary hyperparathyroidism of renal origin: Secondary | ICD-10-CM | POA: Diagnosis not present

## 2015-09-29 DIAGNOSIS — I739 Peripheral vascular disease, unspecified: Secondary | ICD-10-CM | POA: Diagnosis not present

## 2015-09-29 DIAGNOSIS — N186 End stage renal disease: Secondary | ICD-10-CM | POA: Diagnosis not present

## 2015-10-03 DIAGNOSIS — N186 End stage renal disease: Secondary | ICD-10-CM | POA: Diagnosis not present

## 2015-10-03 DIAGNOSIS — T82858D Stenosis of vascular prosthetic devices, implants and grafts, subsequent encounter: Secondary | ICD-10-CM | POA: Diagnosis not present

## 2015-10-03 DIAGNOSIS — Z992 Dependence on renal dialysis: Secondary | ICD-10-CM | POA: Diagnosis not present

## 2015-10-03 DIAGNOSIS — D631 Anemia in chronic kidney disease: Secondary | ICD-10-CM | POA: Diagnosis not present

## 2015-10-03 DIAGNOSIS — N2581 Secondary hyperparathyroidism of renal origin: Secondary | ICD-10-CM | POA: Diagnosis not present

## 2015-10-03 DIAGNOSIS — E1129 Type 2 diabetes mellitus with other diabetic kidney complication: Secondary | ICD-10-CM | POA: Diagnosis not present

## 2015-10-03 DIAGNOSIS — I739 Peripheral vascular disease, unspecified: Secondary | ICD-10-CM | POA: Diagnosis not present

## 2015-10-03 DIAGNOSIS — I871 Compression of vein: Secondary | ICD-10-CM | POA: Diagnosis not present

## 2015-10-04 DIAGNOSIS — D631 Anemia in chronic kidney disease: Secondary | ICD-10-CM | POA: Diagnosis not present

## 2015-10-04 DIAGNOSIS — E1129 Type 2 diabetes mellitus with other diabetic kidney complication: Secondary | ICD-10-CM | POA: Diagnosis not present

## 2015-10-04 DIAGNOSIS — I739 Peripheral vascular disease, unspecified: Secondary | ICD-10-CM | POA: Insufficient documentation

## 2015-10-04 DIAGNOSIS — N186 End stage renal disease: Secondary | ICD-10-CM | POA: Diagnosis not present

## 2015-10-04 DIAGNOSIS — N2581 Secondary hyperparathyroidism of renal origin: Secondary | ICD-10-CM | POA: Diagnosis not present

## 2015-10-04 DIAGNOSIS — M79609 Pain in unspecified limb: Secondary | ICD-10-CM | POA: Insufficient documentation

## 2015-10-06 DIAGNOSIS — E1129 Type 2 diabetes mellitus with other diabetic kidney complication: Secondary | ICD-10-CM | POA: Diagnosis not present

## 2015-10-06 DIAGNOSIS — D631 Anemia in chronic kidney disease: Secondary | ICD-10-CM | POA: Diagnosis not present

## 2015-10-06 DIAGNOSIS — N2581 Secondary hyperparathyroidism of renal origin: Secondary | ICD-10-CM | POA: Diagnosis not present

## 2015-10-06 DIAGNOSIS — I739 Peripheral vascular disease, unspecified: Secondary | ICD-10-CM | POA: Diagnosis not present

## 2015-10-06 DIAGNOSIS — N186 End stage renal disease: Secondary | ICD-10-CM | POA: Diagnosis not present

## 2015-10-08 DIAGNOSIS — D631 Anemia in chronic kidney disease: Secondary | ICD-10-CM | POA: Diagnosis not present

## 2015-10-08 DIAGNOSIS — E1129 Type 2 diabetes mellitus with other diabetic kidney complication: Secondary | ICD-10-CM | POA: Diagnosis not present

## 2015-10-08 DIAGNOSIS — N2581 Secondary hyperparathyroidism of renal origin: Secondary | ICD-10-CM | POA: Diagnosis not present

## 2015-10-08 DIAGNOSIS — N186 End stage renal disease: Secondary | ICD-10-CM | POA: Diagnosis not present

## 2015-10-08 DIAGNOSIS — I739 Peripheral vascular disease, unspecified: Secondary | ICD-10-CM | POA: Diagnosis not present

## 2015-10-10 DIAGNOSIS — N186 End stage renal disease: Secondary | ICD-10-CM | POA: Diagnosis not present

## 2015-10-10 DIAGNOSIS — T82858D Stenosis of vascular prosthetic devices, implants and grafts, subsequent encounter: Secondary | ICD-10-CM | POA: Diagnosis not present

## 2015-10-10 DIAGNOSIS — Z992 Dependence on renal dialysis: Secondary | ICD-10-CM | POA: Diagnosis not present

## 2015-10-10 DIAGNOSIS — F4323 Adjustment disorder with mixed anxiety and depressed mood: Secondary | ICD-10-CM | POA: Diagnosis not present

## 2015-10-10 DIAGNOSIS — I871 Compression of vein: Secondary | ICD-10-CM | POA: Diagnosis not present

## 2015-10-11 DIAGNOSIS — I739 Peripheral vascular disease, unspecified: Secondary | ICD-10-CM | POA: Diagnosis not present

## 2015-10-11 DIAGNOSIS — N186 End stage renal disease: Secondary | ICD-10-CM | POA: Diagnosis not present

## 2015-10-11 DIAGNOSIS — D631 Anemia in chronic kidney disease: Secondary | ICD-10-CM | POA: Diagnosis not present

## 2015-10-11 DIAGNOSIS — N2581 Secondary hyperparathyroidism of renal origin: Secondary | ICD-10-CM | POA: Diagnosis not present

## 2015-10-11 DIAGNOSIS — E1129 Type 2 diabetes mellitus with other diabetic kidney complication: Secondary | ICD-10-CM | POA: Diagnosis not present

## 2015-10-13 DIAGNOSIS — Z992 Dependence on renal dialysis: Secondary | ICD-10-CM | POA: Diagnosis not present

## 2015-10-13 DIAGNOSIS — N186 End stage renal disease: Secondary | ICD-10-CM | POA: Diagnosis not present

## 2015-10-13 DIAGNOSIS — E1129 Type 2 diabetes mellitus with other diabetic kidney complication: Secondary | ICD-10-CM | POA: Diagnosis not present

## 2015-10-15 DIAGNOSIS — I739 Peripheral vascular disease, unspecified: Secondary | ICD-10-CM | POA: Diagnosis not present

## 2015-10-15 DIAGNOSIS — N186 End stage renal disease: Secondary | ICD-10-CM | POA: Diagnosis not present

## 2015-10-15 DIAGNOSIS — N2581 Secondary hyperparathyroidism of renal origin: Secondary | ICD-10-CM | POA: Diagnosis not present

## 2015-10-15 DIAGNOSIS — E1129 Type 2 diabetes mellitus with other diabetic kidney complication: Secondary | ICD-10-CM | POA: Diagnosis not present

## 2015-10-15 DIAGNOSIS — D631 Anemia in chronic kidney disease: Secondary | ICD-10-CM | POA: Diagnosis not present

## 2015-10-18 DIAGNOSIS — M79642 Pain in left hand: Secondary | ICD-10-CM | POA: Diagnosis not present

## 2015-10-18 DIAGNOSIS — Z992 Dependence on renal dialysis: Secondary | ICD-10-CM | POA: Diagnosis not present

## 2015-10-18 DIAGNOSIS — I739 Peripheral vascular disease, unspecified: Secondary | ICD-10-CM | POA: Diagnosis not present

## 2015-10-18 DIAGNOSIS — T82856A Stenosis of peripheral vascular stent, initial encounter: Secondary | ICD-10-CM | POA: Diagnosis not present

## 2015-10-18 DIAGNOSIS — Z7902 Long term (current) use of antithrombotics/antiplatelets: Secondary | ICD-10-CM | POA: Diagnosis not present

## 2015-10-18 DIAGNOSIS — Z7982 Long term (current) use of aspirin: Secondary | ICD-10-CM | POA: Diagnosis not present

## 2015-10-18 DIAGNOSIS — R0602 Shortness of breath: Secondary | ICD-10-CM | POA: Diagnosis not present

## 2015-10-18 DIAGNOSIS — N186 End stage renal disease: Secondary | ICD-10-CM | POA: Diagnosis not present

## 2015-10-18 DIAGNOSIS — E1122 Type 2 diabetes mellitus with diabetic chronic kidney disease: Secondary | ICD-10-CM | POA: Diagnosis not present

## 2015-10-18 DIAGNOSIS — I120 Hypertensive chronic kidney disease with stage 5 chronic kidney disease or end stage renal disease: Secondary | ICD-10-CM | POA: Diagnosis not present

## 2015-10-18 DIAGNOSIS — Z794 Long term (current) use of insulin: Secondary | ICD-10-CM | POA: Diagnosis not present

## 2015-10-19 DIAGNOSIS — E1122 Type 2 diabetes mellitus with diabetic chronic kidney disease: Secondary | ICD-10-CM | POA: Diagnosis not present

## 2015-10-19 DIAGNOSIS — D631 Anemia in chronic kidney disease: Secondary | ICD-10-CM | POA: Diagnosis not present

## 2015-10-19 DIAGNOSIS — T82856A Stenosis of peripheral vascular stent, initial encounter: Secondary | ICD-10-CM | POA: Diagnosis not present

## 2015-10-19 DIAGNOSIS — Z992 Dependence on renal dialysis: Secondary | ICD-10-CM | POA: Diagnosis not present

## 2015-10-19 DIAGNOSIS — N186 End stage renal disease: Secondary | ICD-10-CM | POA: Diagnosis not present

## 2015-10-19 DIAGNOSIS — I120 Hypertensive chronic kidney disease with stage 5 chronic kidney disease or end stage renal disease: Secondary | ICD-10-CM | POA: Diagnosis not present

## 2015-10-19 DIAGNOSIS — I739 Peripheral vascular disease, unspecified: Secondary | ICD-10-CM | POA: Diagnosis not present

## 2015-10-19 DIAGNOSIS — R0602 Shortness of breath: Secondary | ICD-10-CM | POA: Diagnosis not present

## 2015-10-19 DIAGNOSIS — M79642 Pain in left hand: Secondary | ICD-10-CM | POA: Diagnosis not present

## 2015-10-20 DIAGNOSIS — E1129 Type 2 diabetes mellitus with other diabetic kidney complication: Secondary | ICD-10-CM | POA: Diagnosis not present

## 2015-10-20 DIAGNOSIS — D631 Anemia in chronic kidney disease: Secondary | ICD-10-CM | POA: Diagnosis not present

## 2015-10-20 DIAGNOSIS — N186 End stage renal disease: Secondary | ICD-10-CM | POA: Diagnosis not present

## 2015-10-20 DIAGNOSIS — I739 Peripheral vascular disease, unspecified: Secondary | ICD-10-CM | POA: Diagnosis not present

## 2015-10-20 DIAGNOSIS — N2581 Secondary hyperparathyroidism of renal origin: Secondary | ICD-10-CM | POA: Diagnosis not present

## 2015-10-21 DIAGNOSIS — N186 End stage renal disease: Secondary | ICD-10-CM | POA: Diagnosis not present

## 2015-10-21 DIAGNOSIS — N2581 Secondary hyperparathyroidism of renal origin: Secondary | ICD-10-CM | POA: Diagnosis not present

## 2015-10-21 DIAGNOSIS — E1129 Type 2 diabetes mellitus with other diabetic kidney complication: Secondary | ICD-10-CM | POA: Diagnosis not present

## 2015-10-21 DIAGNOSIS — D631 Anemia in chronic kidney disease: Secondary | ICD-10-CM | POA: Diagnosis not present

## 2015-10-21 DIAGNOSIS — I739 Peripheral vascular disease, unspecified: Secondary | ICD-10-CM | POA: Diagnosis not present

## 2015-10-22 DIAGNOSIS — E1129 Type 2 diabetes mellitus with other diabetic kidney complication: Secondary | ICD-10-CM | POA: Diagnosis not present

## 2015-10-22 DIAGNOSIS — D631 Anemia in chronic kidney disease: Secondary | ICD-10-CM | POA: Diagnosis not present

## 2015-10-22 DIAGNOSIS — N2581 Secondary hyperparathyroidism of renal origin: Secondary | ICD-10-CM | POA: Diagnosis not present

## 2015-10-22 DIAGNOSIS — I739 Peripheral vascular disease, unspecified: Secondary | ICD-10-CM | POA: Diagnosis not present

## 2015-10-22 DIAGNOSIS — N186 End stage renal disease: Secondary | ICD-10-CM | POA: Diagnosis not present

## 2015-10-24 ENCOUNTER — Ambulatory Visit: Payer: Self-pay | Admitting: Endocrinology

## 2015-10-25 DIAGNOSIS — F4323 Adjustment disorder with mixed anxiety and depressed mood: Secondary | ICD-10-CM | POA: Diagnosis not present

## 2015-10-25 DIAGNOSIS — N186 End stage renal disease: Secondary | ICD-10-CM | POA: Diagnosis not present

## 2015-10-25 DIAGNOSIS — D631 Anemia in chronic kidney disease: Secondary | ICD-10-CM | POA: Diagnosis not present

## 2015-10-25 DIAGNOSIS — N2581 Secondary hyperparathyroidism of renal origin: Secondary | ICD-10-CM | POA: Diagnosis not present

## 2015-10-25 DIAGNOSIS — E1129 Type 2 diabetes mellitus with other diabetic kidney complication: Secondary | ICD-10-CM | POA: Diagnosis not present

## 2015-10-25 DIAGNOSIS — I739 Peripheral vascular disease, unspecified: Secondary | ICD-10-CM | POA: Diagnosis not present

## 2015-10-27 DIAGNOSIS — I739 Peripheral vascular disease, unspecified: Secondary | ICD-10-CM | POA: Diagnosis not present

## 2015-10-27 DIAGNOSIS — E1129 Type 2 diabetes mellitus with other diabetic kidney complication: Secondary | ICD-10-CM | POA: Diagnosis not present

## 2015-10-27 DIAGNOSIS — N2581 Secondary hyperparathyroidism of renal origin: Secondary | ICD-10-CM | POA: Diagnosis not present

## 2015-10-27 DIAGNOSIS — N186 End stage renal disease: Secondary | ICD-10-CM | POA: Diagnosis not present

## 2015-10-27 DIAGNOSIS — Z992 Dependence on renal dialysis: Secondary | ICD-10-CM | POA: Diagnosis not present

## 2015-10-27 DIAGNOSIS — D631 Anemia in chronic kidney disease: Secondary | ICD-10-CM | POA: Diagnosis not present

## 2015-10-29 DIAGNOSIS — N2581 Secondary hyperparathyroidism of renal origin: Secondary | ICD-10-CM | POA: Diagnosis not present

## 2015-10-29 DIAGNOSIS — E1129 Type 2 diabetes mellitus with other diabetic kidney complication: Secondary | ICD-10-CM | POA: Diagnosis not present

## 2015-10-29 DIAGNOSIS — D631 Anemia in chronic kidney disease: Secondary | ICD-10-CM | POA: Diagnosis not present

## 2015-10-29 DIAGNOSIS — Z23 Encounter for immunization: Secondary | ICD-10-CM | POA: Diagnosis not present

## 2015-10-29 DIAGNOSIS — N186 End stage renal disease: Secondary | ICD-10-CM | POA: Diagnosis not present

## 2015-11-01 DIAGNOSIS — D631 Anemia in chronic kidney disease: Secondary | ICD-10-CM | POA: Diagnosis not present

## 2015-11-01 DIAGNOSIS — N186 End stage renal disease: Secondary | ICD-10-CM | POA: Diagnosis not present

## 2015-11-01 DIAGNOSIS — N2581 Secondary hyperparathyroidism of renal origin: Secondary | ICD-10-CM | POA: Diagnosis not present

## 2015-11-01 DIAGNOSIS — Z23 Encounter for immunization: Secondary | ICD-10-CM | POA: Diagnosis not present

## 2015-11-01 DIAGNOSIS — E1129 Type 2 diabetes mellitus with other diabetic kidney complication: Secondary | ICD-10-CM | POA: Diagnosis not present

## 2015-11-02 DIAGNOSIS — E119 Type 2 diabetes mellitus without complications: Secondary | ICD-10-CM | POA: Diagnosis not present

## 2015-11-02 DIAGNOSIS — Z01810 Encounter for preprocedural cardiovascular examination: Secondary | ICD-10-CM | POA: Diagnosis not present

## 2015-11-02 DIAGNOSIS — F324 Major depressive disorder, single episode, in partial remission: Secondary | ICD-10-CM | POA: Diagnosis not present

## 2015-11-03 ENCOUNTER — Encounter: Payer: Self-pay | Admitting: Vascular Surgery

## 2015-11-03 DIAGNOSIS — Z23 Encounter for immunization: Secondary | ICD-10-CM | POA: Diagnosis not present

## 2015-11-03 DIAGNOSIS — N2581 Secondary hyperparathyroidism of renal origin: Secondary | ICD-10-CM | POA: Diagnosis not present

## 2015-11-03 DIAGNOSIS — D631 Anemia in chronic kidney disease: Secondary | ICD-10-CM | POA: Diagnosis not present

## 2015-11-03 DIAGNOSIS — E1129 Type 2 diabetes mellitus with other diabetic kidney complication: Secondary | ICD-10-CM | POA: Diagnosis not present

## 2015-11-03 DIAGNOSIS — N186 End stage renal disease: Secondary | ICD-10-CM | POA: Diagnosis not present

## 2015-11-05 DIAGNOSIS — D631 Anemia in chronic kidney disease: Secondary | ICD-10-CM | POA: Diagnosis not present

## 2015-11-05 DIAGNOSIS — E1129 Type 2 diabetes mellitus with other diabetic kidney complication: Secondary | ICD-10-CM | POA: Diagnosis not present

## 2015-11-05 DIAGNOSIS — Z23 Encounter for immunization: Secondary | ICD-10-CM | POA: Diagnosis not present

## 2015-11-05 DIAGNOSIS — N186 End stage renal disease: Secondary | ICD-10-CM | POA: Diagnosis not present

## 2015-11-05 DIAGNOSIS — N2581 Secondary hyperparathyroidism of renal origin: Secondary | ICD-10-CM | POA: Diagnosis not present

## 2015-11-08 DIAGNOSIS — E1129 Type 2 diabetes mellitus with other diabetic kidney complication: Secondary | ICD-10-CM | POA: Diagnosis not present

## 2015-11-08 DIAGNOSIS — Z23 Encounter for immunization: Secondary | ICD-10-CM | POA: Diagnosis not present

## 2015-11-08 DIAGNOSIS — N2581 Secondary hyperparathyroidism of renal origin: Secondary | ICD-10-CM | POA: Diagnosis not present

## 2015-11-08 DIAGNOSIS — N186 End stage renal disease: Secondary | ICD-10-CM | POA: Diagnosis not present

## 2015-11-08 DIAGNOSIS — D631 Anemia in chronic kidney disease: Secondary | ICD-10-CM | POA: Diagnosis not present

## 2015-11-09 ENCOUNTER — Encounter: Payer: Self-pay | Admitting: Vascular Surgery

## 2015-11-09 ENCOUNTER — Ambulatory Visit (INDEPENDENT_AMBULATORY_CARE_PROVIDER_SITE_OTHER): Payer: Medicare Other | Admitting: Vascular Surgery

## 2015-11-09 VITALS — BP 124/82 | HR 93 | Temp 98.3°F | Resp 16 | Ht 62.0 in | Wt 173.0 lb

## 2015-11-09 DIAGNOSIS — N184 Chronic kidney disease, stage 4 (severe): Secondary | ICD-10-CM

## 2015-11-09 NOTE — Progress Notes (Signed)
Patient name: Morgan [name] MRN: [id number] DOB: January 04, 1971 Sex: female  REASON FOR VISIT: To evaluate for a right arm AV graft. Referred by Dr. Jamal Maes.  HPI: Morgan [name] is a 45 y.o. female I replaced the lateral half of the graft on 05/13/2015. Of note she's previously had her left axillary artery stented at [facility]. She apparently had another procedure recently at [facility] and we will try to obtain those records. Regardless she's been having problems with her upper arm graft on the left and we've been asked to evaluate her for possible a right arm graft as a backup. She has never had access in the right arm before. She has had a previous vein map which shows that she does not appear to have adequate vein for a fistula in the right arm. She's also had an arterial study back in March which showed that she had normal anatomy on the right with triphasic Doppler signals.  She dialyzes on Tuesdays Thursdays and Saturdays  Past Medical History:  Diagnosis Date  . Anemia    low iron  . Constipation    when given iron  . Diabetes mellitus without complication ([place])    type 2  . Diabetic retinopathy ([place])   . H/O detached retina repair    bilateral  . Hypertension   . Pneumonia   . Renal disorder    dialysis on T/Th/Sa    Family History  Problem Relation Age of Onset  . Diabetes Maternal Grandmother     SOCIAL HISTORY: Social History  Substance Use Topics  . Smoking status: Never Smoker  . Smokeless tobacco: Never Used  . Alcohol use No    Allergies  Allergen Reactions  . Fluorescein Shortness Of Breath and Other (See Comments)    Hypertension  . Iodinated Diagnostic Agents Anaphylaxis, Hives and Swelling    Diffuse swelling  . Iohexol Anaphylaxis, Hives, Itching and Swelling    13 HR PRE-MEDS REQUIRED  . Latex Itching    Current Outpatient Prescriptions  Medication Sig Dispense Refill  . aspirin 81 MG tablet Take 81 mg  by mouth daily.    . B Complex-C-Folic Acid (DIALYVITE TABLET) TABS Take 1 tablet by mouth daily at 12 noon. Reported on 05/04/2015    . cinacalcet (SENSIPAR) 60 MG tablet Take 60 mg by mouth daily.    . clopidogrel (PLAVIX) 75 MG tablet Take 75 mg by mouth daily.    Marland Kitchen glucose blood (ONE TOUCH ULTRA TEST) test strip 1 each by Other route 2 (two) times daily. And lancets 2/day 100 each 12  . insulin NPH-regular Human (NOVOLIN 70/30) (70-30) 100 UNIT/ML injection Inject 30 Units into the skin daily with breakfast. And syringes 1/day 10 mL 11  . levonorgestrel (MIRENA) 20 MCG/24HR IUD 1 each by Intrauterine route once.    . lidocaine-prilocaine (EMLA) cream Apply 1 application topically daily as needed (dialysis).     . pioglitazone (ACTOS) 15 MG tablet Take 15 mg by mouth daily.    . sevelamer carbonate (RENVELA) 800 MG tablet Take 800 mg by mouth 2 (two) times daily.     . Insulin Pen Needle (DROPLET PEN NEEDLES) 31G X 5 MM MISC Use to inject insulin 3 times per day (Patient not taking: Reported on 11/09/2015) 300 each 2   No current facility-administered medications for this visit.     REVIEW OF SYSTEMS:  [X]  denotes positive finding, [ ]  denotes negative finding Cardiac  Comments:  Chest pain or chest pressure:    Shortness of breath upon exertion:    Short of breath when lying flat:    Irregular heart rhythm:        Vascular    Pain in calf, thigh, or hip brought on by ambulation:    Pain in feet at night that wakes you up from your sleep:     Blood clot in your veins:    Leg swelling:         Pulmonary    Oxygen at home:    Productive cough:     Wheezing:         Neurologic    Sudden weakness in arms or legs:     Sudden numbness in arms or legs:     Sudden onset of difficulty speaking or slurred speech:    Temporary loss of vision in one eye:     Problems with dizziness:         Gastrointestinal    Blood in stool:     Vomited blood:         Genitourinary    Burning when  urinating:     Blood in urine:        Psychiatric    Major depression:         Hematologic    Bleeding problems:    Problems with blood clotting too easily:        Skin    Rashes or ulcers:        Constitutional    Fever or chills:      PHYSICAL EXAM: Vitals:   11/09/15 1106  BP: 124/82  Pulse: 93  Resp: 16  Temp: 98.3 F (36.8 C)  TempSrc: Oral  SpO2: 100%  Weight: 173 lb (78.5 kg)  Height: 5\' 2"  (1.575 m)    GENERAL: The patient is a well-nourished female, in no acute distress. The vital signs are documented above. CARDIAC: There is a regular rate and rhythm.  VASCULAR: She has a diminished right radial pulse. She does have Doppler signals in the radial and ulnar positions on the right although they are monophasic on my exam. PULMONARY: There is good air exchange bilaterally without wheezing or rales. ABDOMEN: Soft and non-tender with normal pitched bowel sounds.  MUSCULOSKELETAL: There are no major deformities or cyanosis. NEUROLOGIC: No focal weakness or paresthesias are detected. SKIN: There are no ulcers or rashes noted. PSYCHIATRIC: The patient has a normal affect.  DATA:   I have reviewed her previous arterial Doppler study that was done in March 2017 which showed a triphasic radial and ulnar signal. There right brachial artery bifurcation was at the antecubital level. I've also reviewed her vein map from 05/04/2015 which shows that her upper arm and forearm cephalic veins on the right are quite small and likely not usable for fistula. Her basilic vein likewise is small on the right.  MEDICAL ISSUES:  STAGE V CHRONIC KIDNEY DISEASE: I think that she would be a candidate for a right arm graft. This has been scheduled for 11/25/2015. In the meantime we will try to obtain her records from [facility]. I have discussed the procedure that'll cover locations with the patient and she is agreeable to proceed.    Deitra Mayo [facility] [phone]

## 2015-11-10 DIAGNOSIS — N186 End stage renal disease: Secondary | ICD-10-CM | POA: Diagnosis not present

## 2015-11-10 DIAGNOSIS — Z23 Encounter for immunization: Secondary | ICD-10-CM | POA: Diagnosis not present

## 2015-11-10 DIAGNOSIS — N2581 Secondary hyperparathyroidism of renal origin: Secondary | ICD-10-CM | POA: Diagnosis not present

## 2015-11-10 DIAGNOSIS — D631 Anemia in chronic kidney disease: Secondary | ICD-10-CM | POA: Diagnosis not present

## 2015-11-10 DIAGNOSIS — E1129 Type 2 diabetes mellitus with other diabetic kidney complication: Secondary | ICD-10-CM | POA: Diagnosis not present

## 2015-11-11 ENCOUNTER — Other Ambulatory Visit: Payer: Self-pay | Admitting: *Deleted

## 2015-11-12 DIAGNOSIS — D631 Anemia in chronic kidney disease: Secondary | ICD-10-CM | POA: Diagnosis not present

## 2015-11-12 DIAGNOSIS — N186 End stage renal disease: Secondary | ICD-10-CM | POA: Diagnosis not present

## 2015-11-12 DIAGNOSIS — N2581 Secondary hyperparathyroidism of renal origin: Secondary | ICD-10-CM | POA: Diagnosis not present

## 2015-11-12 DIAGNOSIS — Z23 Encounter for immunization: Secondary | ICD-10-CM | POA: Diagnosis not present

## 2015-11-12 DIAGNOSIS — E1129 Type 2 diabetes mellitus with other diabetic kidney complication: Secondary | ICD-10-CM | POA: Diagnosis not present

## 2015-11-15 ENCOUNTER — Ambulatory Visit: Payer: Self-pay | Admitting: Endocrinology

## 2015-11-15 ENCOUNTER — Other Ambulatory Visit: Payer: Self-pay | Admitting: Internal Medicine

## 2015-11-15 DIAGNOSIS — Z01810 Encounter for preprocedural cardiovascular examination: Secondary | ICD-10-CM

## 2015-11-15 DIAGNOSIS — E1129 Type 2 diabetes mellitus with other diabetic kidney complication: Secondary | ICD-10-CM | POA: Diagnosis not present

## 2015-11-15 DIAGNOSIS — N186 End stage renal disease: Secondary | ICD-10-CM | POA: Diagnosis not present

## 2015-11-15 DIAGNOSIS — D631 Anemia in chronic kidney disease: Secondary | ICD-10-CM | POA: Diagnosis not present

## 2015-11-15 DIAGNOSIS — N2581 Secondary hyperparathyroidism of renal origin: Secondary | ICD-10-CM | POA: Diagnosis not present

## 2015-11-15 DIAGNOSIS — Z23 Encounter for immunization: Secondary | ICD-10-CM | POA: Diagnosis not present

## 2015-11-17 DIAGNOSIS — N2581 Secondary hyperparathyroidism of renal origin: Secondary | ICD-10-CM | POA: Diagnosis not present

## 2015-11-17 DIAGNOSIS — D631 Anemia in chronic kidney disease: Secondary | ICD-10-CM | POA: Diagnosis not present

## 2015-11-17 DIAGNOSIS — Z23 Encounter for immunization: Secondary | ICD-10-CM | POA: Diagnosis not present

## 2015-11-17 DIAGNOSIS — E1129 Type 2 diabetes mellitus with other diabetic kidney complication: Secondary | ICD-10-CM | POA: Diagnosis not present

## 2015-11-17 DIAGNOSIS — N186 End stage renal disease: Secondary | ICD-10-CM | POA: Diagnosis not present

## 2015-11-19 DIAGNOSIS — N2581 Secondary hyperparathyroidism of renal origin: Secondary | ICD-10-CM | POA: Diagnosis not present

## 2015-11-19 DIAGNOSIS — D631 Anemia in chronic kidney disease: Secondary | ICD-10-CM | POA: Diagnosis not present

## 2015-11-19 DIAGNOSIS — Z23 Encounter for immunization: Secondary | ICD-10-CM | POA: Diagnosis not present

## 2015-11-19 DIAGNOSIS — N186 End stage renal disease: Secondary | ICD-10-CM | POA: Diagnosis not present

## 2015-11-19 DIAGNOSIS — E1129 Type 2 diabetes mellitus with other diabetic kidney complication: Secondary | ICD-10-CM | POA: Diagnosis not present

## 2015-11-21 ENCOUNTER — Encounter: Payer: Self-pay | Admitting: Endocrinology

## 2015-11-21 ENCOUNTER — Ambulatory Visit (INDEPENDENT_AMBULATORY_CARE_PROVIDER_SITE_OTHER): Payer: Medicare Other | Admitting: Endocrinology

## 2015-11-21 ENCOUNTER — Ambulatory Visit: Payer: Medicare Other | Admitting: Endocrinology

## 2015-11-21 VITALS — BP 132/84 | HR 89 | Ht 62.0 in | Wt 176.0 lb

## 2015-11-21 DIAGNOSIS — F4323 Adjustment disorder with mixed anxiety and depressed mood: Secondary | ICD-10-CM | POA: Diagnosis not present

## 2015-11-21 DIAGNOSIS — E1122 Type 2 diabetes mellitus with diabetic chronic kidney disease: Secondary | ICD-10-CM | POA: Diagnosis not present

## 2015-11-21 DIAGNOSIS — Z992 Dependence on renal dialysis: Secondary | ICD-10-CM | POA: Diagnosis not present

## 2015-11-21 DIAGNOSIS — N186 End stage renal disease: Secondary | ICD-10-CM | POA: Diagnosis not present

## 2015-11-21 DIAGNOSIS — Z794 Long term (current) use of insulin: Secondary | ICD-10-CM | POA: Diagnosis not present

## 2015-11-21 LAB — POCT GLYCOSYLATED HEMOGLOBIN (HGB A1C): Hemoglobin A1C: 8.1

## 2015-11-21 MED ORDER — INSULIN NPH ISOPHANE & REGULAR (70-30) 100 UNIT/ML ~~LOC~~ SUSP: 35.0000 [IU] | Freq: Every day | SUBCUTANEOUS | 11 refills | Status: DC

## 2015-11-21 NOTE — Progress Notes (Signed)
Subjective:    Patient ID: Morgan [name], female    DOB: [birth year], 45 y.o.   MRN: [id number]  HPI Pt returns for f/u of diabetes mellitus: DM type: Insulin-requiring type 2 Dx'ed: 8502 Complications: polyneuropathy, proliferative retinopathy, and ESRD.  Therapy: insulin since dx GDM: never DKA: never Severe hypoglycemia: never.  Pancreatitis: never Other: She took an intentional insulin overdose in 2014, in a suicide attempt; therapy has been limited by noncompliance, so she is on a qd insulin schedule.    Interval history: Pt says she never misses the insulin.  no cbg record, but states cbg's vary from 78-200's.  It is in general higher as the day goes on.  She says cbg on dialysis days is similar to non-dialysis days.   Past Medical History:  Diagnosis Date  . Anemia    low iron  . Constipation    when given iron  . Diabetes mellitus without complication ([place])    type 2  . Diabetic retinopathy ([place])   . H/O detached retina repair    bilateral  . Hypertension   . Pneumonia   . Renal disorder    dialysis on T/Th/Sa    Past Surgical History:  Procedure Laterality Date  . ARTERIOVENOUS GRAFT PLACEMENT Left 07/2010  . basilic vein transposition Left 2011  . CESAREAN SECTION    . CHOLECYSTECTOMY N/A 10/08/2014   Procedure: LAPAROSCOPIC CHOLECYSTECTOMY ;  Surgeon: Erroll Luna, MD;  Location: [place];  Service: General;  Laterality: N/A;  . EYE SURGERY     retinal detachment  . REVISION OF ARTERIOVENOUS GORETEX GRAFT Left 05/13/2015   Procedure: REVISION OF ARTERIOVENOUS GORETEX GRAFT;  Surgeon: Angelia Mould, MD;  Location: [place];  Service: Vascular;  Laterality: Left;    Social History   Social History  . Marital status: Married    Spouse name: N/A  . Number of children: N/A  . Years of education: N/A   Occupational History  . Not on file.   Social History Main Topics  . Smoking status: Never Smoker  . Smokeless tobacco: Never Used  .  Alcohol use No  . Drug use: No  . Sexual activity: Not on file   Other Topics Concern  . Not on file   Social History Narrative  . No narrative on file    Current Outpatient Prescriptions on File Prior to Visit  Medication Sig Dispense Refill  . aspirin 81 MG tablet Take 81 mg by mouth daily.    . cinacalcet (SENSIPAR) 60 MG tablet Take 60 mg by mouth daily.    . clopidogrel (PLAVIX) 75 MG tablet Take 75 mg by mouth daily.    Marland Kitchen glucose blood (ONE TOUCH ULTRA TEST) test strip 1 each by Other route 2 (two) times daily. And lancets 2/day 100 each 12  . Insulin Pen Needle (DROPLET PEN NEEDLES) 31G X 5 MM MISC Use to inject insulin 3 times per day (Patient not taking: Reported on 11/22/2015) 300 each 2  . levonorgestrel (MIRENA) 20 MCG/24HR IUD 1 each by Intrauterine route once.    . lidocaine-prilocaine (EMLA) cream Apply 1 application topically daily as needed (dialysis).     . pioglitazone (ACTOS) 15 MG tablet Take 15 mg by mouth daily.    . sevelamer carbonate (RENVELA) 800 MG tablet Take 4,000 mg by mouth 3 (three) times daily with meals.     . B Complex-C-Folic Acid (RENA-VITE PO) Take 1 tablet by mouth daily.     No  current facility-administered medications on file prior to visit.     Allergies  Allergen Reactions  . Fluorescein Shortness Of Breath and Other (See Comments)    Hypertension  . Iodinated Diagnostic Agents Anaphylaxis, Hives and Other (See Comments)    Diffuse swelling  . Iohexol Anaphylaxis, Hives, Itching, Swelling and Other (See Comments)    13 HR PRE-MEDS REQUIRED  . Latex Itching    Family History  Problem Relation Age of Onset  . Diabetes Maternal Grandmother     BP 132/84   Pulse 89   Ht 5\' 2"  (1.575 m)   Wt 176 lb (79.8 kg)   SpO2 93%   BMI 32.19 kg/m    Review of Systems She denies hypoglycemia.      Objective:   Physical Exam VITAL SIGNS:  See vs page GENERAL: no distress Pulses: dorsalis pedis intact bilat.   MSK: no deformity of  the feet CV: no leg edema Skin:  no ulcer on the feet.  normal color and temp on the feet. Neuro: sensation is intact to touch on the feet.    Lab Results  Component Value Date   HGBA1C 8.1 11/21/2015      Assessment & Plan:  Insulin-requiring type 2 DM: she needs increased rx Noncompliance: in this setting, she needs a qd insulin schedule. ESRD: in this setting, she has a long duration of action of insulin.  This is supported by the pattern of her cbg's.

## 2015-11-21 NOTE — Patient Instructions (Addendum)
Please increase the insulin to 35 units each morning.   On this type of insulin schedule, you should eat meals on a regular schedule.  If a meal is missed or significantly delayed, your blood sugar could go low.   check your blood sugar twice a day.  vary the time of day when you check, between before the 3 meals, and at bedtime.  also check if you have symptoms of your blood sugar being too high or too low.  please keep a record of the readings and bring it to your next appointment here (or you can bring the meter itself).  You can write it on any piece of paper.  please call us sooner if your blood sugar goes below 70, or if you have a lot of readings over 200.   Please come back for a follow-up appointment in 2 months.

## 2015-11-22 DIAGNOSIS — N186 End stage renal disease: Secondary | ICD-10-CM | POA: Diagnosis not present

## 2015-11-22 DIAGNOSIS — D631 Anemia in chronic kidney disease: Secondary | ICD-10-CM | POA: Diagnosis not present

## 2015-11-22 DIAGNOSIS — Z23 Encounter for immunization: Secondary | ICD-10-CM | POA: Diagnosis not present

## 2015-11-22 DIAGNOSIS — N2581 Secondary hyperparathyroidism of renal origin: Secondary | ICD-10-CM | POA: Diagnosis not present

## 2015-11-22 DIAGNOSIS — E1129 Type 2 diabetes mellitus with other diabetic kidney complication: Secondary | ICD-10-CM | POA: Diagnosis not present

## 2015-11-24 ENCOUNTER — Encounter (HOSPITAL_COMMUNITY): Payer: Self-pay | Admitting: *Deleted

## 2015-11-24 DIAGNOSIS — D631 Anemia in chronic kidney disease: Secondary | ICD-10-CM | POA: Diagnosis not present

## 2015-11-24 DIAGNOSIS — N186 End stage renal disease: Secondary | ICD-10-CM | POA: Diagnosis not present

## 2015-11-24 DIAGNOSIS — Z23 Encounter for immunization: Secondary | ICD-10-CM | POA: Diagnosis not present

## 2015-11-24 DIAGNOSIS — E1129 Type 2 diabetes mellitus with other diabetic kidney complication: Secondary | ICD-10-CM | POA: Diagnosis not present

## 2015-11-24 DIAGNOSIS — N2581 Secondary hyperparathyroidism of renal origin: Secondary | ICD-10-CM | POA: Diagnosis not present

## 2015-11-24 NOTE — Progress Notes (Signed)
Pt denies cardiac history, chest pain or sob. Pt is diabetic, states fasting blood sugar usually is around 72-89. Last A1C was 8.1 on 11/21/15. Instructed pt to check blood sugar in AM prior to coming to the hospital. If blood sugar is 70 or below, treat with 1/2 cup of clear juice (apple or cranberry) and recheck blood sugar 15 minutes after drinking juice. If blood sugar continues to be 70 or below, call the Short Stay department and ask to speak to a nurse. Pt voiced understanding.

## 2015-11-25 ENCOUNTER — Telehealth: Payer: Self-pay | Admitting: Vascular Surgery

## 2015-11-25 ENCOUNTER — Encounter (HOSPITAL_COMMUNITY): Payer: Self-pay | Admitting: *Deleted

## 2015-11-25 ENCOUNTER — Ambulatory Visit (HOSPITAL_COMMUNITY): Payer: Medicare Other | Admitting: Anesthesiology

## 2015-11-25 ENCOUNTER — Encounter (HOSPITAL_COMMUNITY)
Admission: RE | Disposition: A | Discharge status: Home / Self Care | Payer: Self-pay | Source: Ambulatory Visit | Attending: Vascular Surgery

## 2015-11-25 ENCOUNTER — Ambulatory Visit (HOSPITAL_COMMUNITY)
Admission: RE | Admit: 2015-11-25 | Discharge: 2015-11-25 | Disposition: A | Discharge status: Home / Self Care | Payer: Medicare Other | Source: Ambulatory Visit | Attending: Vascular Surgery | Admitting: Vascular Surgery

## 2015-11-25 DIAGNOSIS — E11319 Type 2 diabetes mellitus with unspecified diabetic retinopathy without macular edema: Secondary | ICD-10-CM | POA: Insufficient documentation

## 2015-11-25 DIAGNOSIS — N185 Chronic kidney disease, stage 5: Secondary | ICD-10-CM | POA: Insufficient documentation

## 2015-11-25 DIAGNOSIS — E1122 Type 2 diabetes mellitus with diabetic chronic kidney disease: Secondary | ICD-10-CM | POA: Insufficient documentation

## 2015-11-25 DIAGNOSIS — Z992 Dependence on renal dialysis: Secondary | ICD-10-CM | POA: Diagnosis not present

## 2015-11-25 DIAGNOSIS — Z79899 Other long term (current) drug therapy: Secondary | ICD-10-CM | POA: Insufficient documentation

## 2015-11-25 DIAGNOSIS — Z7982 Long term (current) use of aspirin: Secondary | ICD-10-CM | POA: Insufficient documentation

## 2015-11-25 DIAGNOSIS — I120 Hypertensive chronic kidney disease with stage 5 chronic kidney disease or end stage renal disease: Secondary | ICD-10-CM | POA: Diagnosis not present

## 2015-11-25 DIAGNOSIS — Z794 Long term (current) use of insulin: Secondary | ICD-10-CM | POA: Insufficient documentation

## 2015-11-25 DIAGNOSIS — N189 Chronic kidney disease, unspecified: Secondary | ICD-10-CM | POA: Diagnosis not present

## 2015-11-25 DIAGNOSIS — N184 Chronic kidney disease, stage 4 (severe): Secondary | ICD-10-CM | POA: Diagnosis not present

## 2015-11-25 HISTORY — PX: AV FISTULA PLACEMENT: SHX1204

## 2015-11-25 LAB — POCT I-STAT 4, (NA,K, GLUC, HGB,HCT)
GLUCOSE: 148 mg/dL — AB (ref 65–99)
HCT: 43 % (ref 36.0–46.0)
Hemoglobin: 14.6 g/dL (ref 12.0–15.0)
Potassium: 4.9 mmol/L (ref 3.5–5.1)
Sodium: 137 mmol/L (ref 135–145)

## 2015-11-25 LAB — I-STAT BETA HCG BLOOD, ED (NOT ORDERABLE): I-stat hCG, quantitative: <5 m[IU]/mL (ref <5)

## 2015-11-25 LAB — GLUCOSE, CAPILLARY
GLUCOSE-CAPILLARY: 210 mg/dL — AB (ref 65–99)
Glucose-Capillary: 146 mg/dL — ABNORMAL HIGH (ref 65–99)

## 2015-11-25 SURGERY — INSERTION OF ARTERIOVENOUS (AV) GORE-TEX GRAFT ARM
Anesthesia: Monitor Anesthesia Care | Site: Arm Upper | Laterality: Right

## 2015-11-25 MED ORDER — PROPOFOL 10 MG/ML IV BOLUS
INTRAVENOUS | Status: AC
  Filled 2015-11-25: qty 20

## 2015-11-25 MED ORDER — DEXTROSE 5 % IV SOLN
1.5000 g | INTRAVENOUS | Status: AC
  Administered 2015-11-25: 1.5 g via INTRAVENOUS
  Filled 2015-11-25: qty 1.5

## 2015-11-25 MED ORDER — ONDANSETRON HCL 4 MG/2ML IJ SOLN
INTRAMUSCULAR | Status: DC | PRN
  Administered 2015-11-25: 4 mg via INTRAVENOUS

## 2015-11-25 MED ORDER — LIDOCAINE HCL (PF) 1 % IJ SOLN
INTRAMUSCULAR | Status: AC
  Filled 2015-11-25: qty 30

## 2015-11-25 MED ORDER — SURGIFOAM 100 EX MISC
CUTANEOUS | Status: DC | PRN
  Administered 2015-11-25: 20 mL via TOPICAL

## 2015-11-25 MED ORDER — ONDANSETRON HCL 4 MG/2ML IJ SOLN
INTRAMUSCULAR | Status: AC
  Filled 2015-11-25: qty 2

## 2015-11-25 MED ORDER — HEPARIN SODIUM (PORCINE) 1000 UNIT/ML IJ SOLN
INTRAMUSCULAR | Status: AC
  Filled 2015-11-25: qty 1

## 2015-11-25 MED ORDER — PROPOFOL 10 MG/ML IV BOLUS
INTRAVENOUS | Status: DC | PRN
  Administered 2015-11-25: 30 mg via INTRAVENOUS

## 2015-11-25 MED ORDER — SODIUM CHLORIDE 0.9 % IV SOLN
INTRAVENOUS | Status: DC
  Administered 2015-11-25: 08:00:00 via INTRAVENOUS

## 2015-11-25 MED ORDER — MIDAZOLAM HCL 5 MG/5ML IJ SOLN
INTRAMUSCULAR | Status: DC | PRN
  Administered 2015-11-25 (×2): 1 mg via INTRAVENOUS

## 2015-11-25 MED ORDER — HYDROMORPHONE HCL 1 MG/ML IJ SOLN
0.2500 mg | INTRAMUSCULAR | Status: DC | PRN
  Administered 2015-11-25 (×3): 0.25 mg via INTRAVENOUS

## 2015-11-25 MED ORDER — OXYCODONE-ACETAMINOPHEN 5-325 MG PO TABS: 1.0000 | ORAL_TABLET | Freq: Four times a day (QID) | ORAL | 0 refills | Status: DC | PRN

## 2015-11-25 MED ORDER — PHENYLEPHRINE HCL 10 MG/ML IJ SOLN
INTRAVENOUS | Status: DC | PRN
  Administered 2015-11-25: 10 ug/min via INTRAVENOUS

## 2015-11-25 MED ORDER — PHENYLEPHRINE HCL 10 MG/ML IJ SOLN
INTRAMUSCULAR | Status: DC | PRN
  Administered 2015-11-25: 40 ug via INTRAVENOUS
  Administered 2015-11-25 (×3): 80 ug via INTRAVENOUS

## 2015-11-25 MED ORDER — 0.9 % SODIUM CHLORIDE (POUR BTL) OPTIME
TOPICAL | Status: DC | PRN
  Administered 2015-11-25: 1000 mL

## 2015-11-25 MED ORDER — THROMBIN 20000 UNITS EX SOLR
CUTANEOUS | Status: AC
  Filled 2015-11-25: qty 20000

## 2015-11-25 MED ORDER — PROTAMINE SULFATE 10 MG/ML IV SOLN
INTRAVENOUS | Status: DC | PRN
  Administered 2015-11-25: 40 mg via INTRAVENOUS

## 2015-11-25 MED ORDER — PHENYLEPHRINE 40 MCG/ML (10ML) SYRINGE FOR IV PUSH (FOR BLOOD PRESSURE SUPPORT)
PREFILLED_SYRINGE | INTRAVENOUS | Status: AC
  Filled 2015-11-25: qty 10

## 2015-11-25 MED ORDER — HEPARIN SODIUM (PORCINE) 1000 UNIT/ML IJ SOLN
INTRAMUSCULAR | Status: DC | PRN
  Administered 2015-11-25: 7 mL via INTRAVENOUS

## 2015-11-25 MED ORDER — OXYCODONE-ACETAMINOPHEN 5-325 MG PO TABS
1.0000 | ORAL_TABLET | ORAL | Status: AC | PRN
  Administered 2015-11-25: 1 via ORAL

## 2015-11-25 MED ORDER — OXYCODONE-ACETAMINOPHEN 5-325 MG PO TABS
ORAL_TABLET | ORAL | Status: AC
  Filled 2015-11-25: qty 1

## 2015-11-25 MED ORDER — LIDOCAINE HCL (PF) 1 % IJ SOLN
INTRAMUSCULAR | Status: DC | PRN
  Administered 2015-11-25: 23 mL

## 2015-11-25 MED ORDER — CHLORHEXIDINE GLUCONATE CLOTH 2 % EX PADS: 6.0000 | MEDICATED_PAD | Freq: Once | CUTANEOUS | Status: DC

## 2015-11-25 MED ORDER — LIDOCAINE HCL (PF) 2 % IJ SOLN
INTRAMUSCULAR | Status: DC | PRN
  Administered 2015-11-25: 40 mg via INTRADERMAL

## 2015-11-25 MED ORDER — LIDOCAINE-EPINEPHRINE (PF) 1 %-1:200000 IJ SOLN
INTRAMUSCULAR | Status: DC | PRN
  Administered 2015-11-25: 30 mL

## 2015-11-25 MED ORDER — ALBUMIN HUMAN 5 % IV SOLN
INTRAVENOUS | Status: AC
  Administered 2015-11-25: 12.5 g
  Filled 2015-11-25: qty 250

## 2015-11-25 MED ORDER — HYDROMORPHONE HCL 1 MG/ML IJ SOLN
INTRAMUSCULAR | Status: AC
  Administered 2015-11-25: 0.25 mg via INTRAVENOUS
  Filled 2015-11-25: qty 1

## 2015-11-25 MED ORDER — PROPOFOL 10 MG/ML IV BOLUS
INTRAVENOUS | Status: AC
  Filled 2015-11-25: qty 40

## 2015-11-25 MED ORDER — SODIUM CHLORIDE 0.9 % IV SOLN
INTRAVENOUS | Status: DC | PRN
  Administered 2015-11-25: 07:00:00

## 2015-11-25 MED ORDER — PROTAMINE SULFATE 10 MG/ML IV SOLN
INTRAVENOUS | Status: AC
  Filled 2015-11-25: qty 5

## 2015-11-25 MED ORDER — FENTANYL CITRATE (PF) 100 MCG/2ML IJ SOLN
INTRAMUSCULAR | Status: DC | PRN
  Administered 2015-11-25 (×4): 25 ug via INTRAVENOUS

## 2015-11-25 MED ORDER — LIDOCAINE 2% (20 MG/ML) 5 ML SYRINGE
INTRAMUSCULAR | Status: AC
  Filled 2015-11-25: qty 5

## 2015-11-25 MED ORDER — LIDOCAINE-EPINEPHRINE (PF) 1 %-1:200000 IJ SOLN
INTRAMUSCULAR | Status: AC
  Filled 2015-11-25: qty 30

## 2015-11-25 MED ORDER — FENTANYL CITRATE (PF) 100 MCG/2ML IJ SOLN
INTRAMUSCULAR | Status: AC
  Filled 2015-11-25: qty 2

## 2015-11-25 MED ORDER — MIDAZOLAM HCL 2 MG/2ML IJ SOLN
INTRAMUSCULAR | Status: AC
  Filled 2015-11-25: qty 2

## 2015-11-25 MED ORDER — PROPOFOL 500 MG/50ML IV EMUL
INTRAVENOUS | Status: DC | PRN
  Administered 2015-11-25: 50 ug/kg/min via INTRAVENOUS

## 2015-11-25 MED ORDER — ALBUMIN HUMAN 5 % IV SOLN
INTRAVENOUS | Status: DC | PRN
  Administered 2015-11-25 (×2): via INTRAVENOUS

## 2015-11-25 SURGICAL SUPPLY — 37 items
ADH SKN CLS APL DERMABOND .7 (GAUZE/BANDAGES/DRESSINGS) ×1
ARMBAND PINK RESTRICT EXTREMIT (MISCELLANEOUS) ×4 IMPLANT
CANISTER SUCTION 2500CC (MISCELLANEOUS) ×2 IMPLANT
CANNULA VESSEL 3MM 2 BLNT TIP (CANNULA) ×2 IMPLANT
CLIP TI MEDIUM 6 (CLIP) ×2 IMPLANT
CLIP TI WIDE RED SMALL 6 (CLIP) ×2 IMPLANT
DECANTER SPIKE VIAL GLASS SM (MISCELLANEOUS) ×2 IMPLANT
DERMABOND ADVANCED (GAUZE/BANDAGES/DRESSINGS) ×1
DERMABOND ADVANCED .7 DNX12 (GAUZE/BANDAGES/DRESSINGS) IMPLANT
ELECT REM PT RETURN 9FT ADLT (ELECTROSURGICAL) ×2
ELECTRODE REM PT RTRN 9FT ADLT (ELECTROSURGICAL) ×1 IMPLANT
GLOVE BIO SURGEON STRL SZ7.5 (GLOVE) ×1 IMPLANT
GLOVE BIOGEL PI IND STRL 6.5 (GLOVE) IMPLANT
GLOVE BIOGEL PI IND STRL 7.0 (GLOVE) IMPLANT
GLOVE BIOGEL PI IND STRL 8 (GLOVE) ×1 IMPLANT
GLOVE BIOGEL PI INDICATOR 6.5 (GLOVE) ×2
GLOVE BIOGEL PI INDICATOR 7.0 (GLOVE) ×2
GLOVE BIOGEL PI INDICATOR 8 (GLOVE) ×1
GLOVE SURG SS PI 6.5 STRL IVOR (GLOVE) ×4 IMPLANT
GLOVE SURG SS PI 7.5 STRL IVOR (GLOVE) ×1 IMPLANT
GOWN STRL REUS W/ TWL LRG LVL3 (GOWN DISPOSABLE) ×3 IMPLANT
GOWN STRL REUS W/TWL LRG LVL3 (GOWN DISPOSABLE) ×10
GRAFT GORETEX STRT 4-7X45 (Vascular Products) ×1 IMPLANT
KIT BASIN OR (CUSTOM PROCEDURE TRAY) ×2 IMPLANT
KIT ROOM TURNOVER OR (KITS) ×2 IMPLANT
NS IRRIG 1000ML POUR BTL (IV SOLUTION) ×2 IMPLANT
PACK CV ACCESS (CUSTOM PROCEDURE TRAY) ×2 IMPLANT
PAD ARMBOARD 7.5X6 YLW CONV (MISCELLANEOUS) ×4 IMPLANT
SOAP 2 % CHG 4 OZ (WOUND CARE) ×1 IMPLANT
SPONGE LAP 18X18 X RAY DECT (DISPOSABLE) ×1 IMPLANT
SPONGE SURGIFOAM ABS GEL 100 (HEMOSTASIS) ×1 IMPLANT
SUT PROLENE 6 0 BV (SUTURE) ×9 IMPLANT
SUT VIC AB 3-0 SH 27 (SUTURE) ×4
SUT VIC AB 3-0 SH 27X BRD (SUTURE) ×2 IMPLANT
SUT VICRYL 4-0 PS2 18IN ABS (SUTURE) ×4 IMPLANT
UNDERPAD 30X30 (UNDERPADS AND DIAPERS) ×2 IMPLANT
WATER STERILE IRR 1000ML POUR (IV SOLUTION) ×2 IMPLANT

## 2015-11-25 NOTE — Telephone Encounter (Signed)
-----   Message from Mena Goes, RN sent at 11/25/2015 12:14 PM EDT ----- Regarding: schedule   ----- Message ----- From: Gabriel Earing, PA-C Sent: 11/25/2015   9:54 AM To: [facility] Charge Pool  S/p RUA AVG 11/25/15.  F/u with [facility] in 2 weeks.  Thanks, Aldona Bar

## 2015-11-25 NOTE — Anesthesia Postprocedure Evaluation (Signed)
Anesthesia Post Note  Patient: Morgan [name] [id number]  Procedure(s) Performed: Procedure(s) (LRB): INSERTION OF ARTERIOVENOUS (AV) GORE-TEX GRAFT RIGHT  ARM USING 4-7 MM X 45 CM STRETCH GRAFT. (Right)  Patient location during evaluation: PACU Anesthesia Type: MAC Level of consciousness: awake Pain management: pain level controlled Respiratory status: spontaneous breathing Cardiovascular status: stable Anesthetic complications: no    Last Vitals:  Vitals:   11/25/15 1234 11/25/15 1255  BP: 95/74 94/76  Pulse:  92  Resp: 18 16  Temp:  36.3 C    Last Pain:  Vitals:   11/25/15 1255  TempSrc:   PainSc: 0-No pain                 EDWARDS,Petrona Wyeth

## 2015-11-25 NOTE — Discharge Instructions (Signed)
° ° °  11/25/2015 Morgan [name] [id number] Jul 05, 1970  Surgeon(s): Angelia Mould, MD  Procedure(s): INSERTION OF ARTERIOVENOUS (AV) GORE-TEX GRAFT RIGHT  ARM USING 4-7 MM X 45 CM STRETCH GRAFT.  x Do not stick graft for 4 weeks

## 2015-11-25 NOTE — Transfer of Care (Signed)
Immediate Anesthesia Transfer of Care Note  Patient: Morgan [name] [id number]  Procedure(s) Performed: Procedure(s): INSERTION OF ARTERIOVENOUS (AV) GORE-TEX GRAFT RIGHT  ARM USING 4-7 MM X 45 CM STRETCH GRAFT. (Right)  Patient Location: PACU  Anesthesia Type:MAC  Level of Consciousness: awake, alert , oriented and patient cooperative  Airway & Oxygen Therapy: Patient Spontanous Breathing  Post-op Assessment: Report given to RN, Post -op Vital signs reviewed and unstable, Anesthesiologist notified and Patient moving all extremities  Post vital signs: Reviewed and unstable  Last Vitals:  Vitals:   11/25/15 1005 11/25/15 1006  BP: (!) 58/33 (!) 83/28  Pulse:    Resp: 13 (!) 9  Temp:      Last Pain:  Vitals:   11/25/15 0702  TempSrc: Oral         Complications: cardiovascular complications

## 2015-11-25 NOTE — Interval H&P Note (Signed)
History and Physical Interval Note:  11/25/2015 7:16 AM  Morgan [name]  has presented today for surgery, with the diagnosis of Chronic kidney disease   The various methods of treatment have been discussed with the patient and family. After consideration of risks, benefits and other options for treatment, the patient has consented to  Procedure(s): INSERTION OF ARTERIOVENOUS (AV) GORE-TEX GRAFT ARM (Right) as a surgical intervention .  The patient's history has been reviewed, patient examined, no change in status, stable for surgery.  I have reviewed the patient's chart and labs.  Questions were answered to the patient's satisfaction.     Deitra Mayo

## 2015-11-25 NOTE — Anesthesia Preprocedure Evaluation (Addendum)
Anesthesia Evaluation  Patient identified by MRN, date of birth, ID band Patient awake    Reviewed: Allergy & Precautions, NPO status   Airway Mallampati: II       Dental   Pulmonary pneumonia,    breath sounds clear to auscultation       Cardiovascular hypertension,  Rhythm:Regular Rate:Normal     Neuro/Psych    GI/Hepatic negative GI ROS, Neg liver ROS,   Endo/Other  diabetes  Renal/GU Renal disease     Musculoskeletal   Abdominal   Peds  Hematology   Anesthesia Other Findings   Reproductive/Obstetrics                            Anesthesia Physical Anesthesia Plan  ASA: IV  Anesthesia Plan: MAC   Post-op Pain Management:    Induction:   Airway Management Planned: Simple Face Mask  Additional Equipment:   Intra-op Plan:   Post-operative Plan:   Informed Consent:   Dental advisory given  Plan Discussed with: CRNA and Anesthesiologist  Anesthesia Plan Comments:        Anesthesia Quick Evaluation

## 2015-11-25 NOTE — Progress Notes (Signed)
   The basilic vein transposition cannot be salvaged. There are no surgical or endovascular options for revision. I would recommend placement of a left arm AV graft. She does not have a central venous stenosis on the left. We will tentatively schedule this for Monday. If she is discharged before that and we will arrange this as an outpatient.  Morgan Mayo, MD, FACS [facility]

## 2015-11-25 NOTE — H&P (View-Only) (Signed)
Patient name: Morgan [name] MRN: [id number] DOB: 12/23/1970 Sex: female  REASON FOR VISIT: To evaluate for a right arm AV graft. Referred by Dr. Jamal Maes.  HPI: Morgan [name] is a 45 y.o. female I replaced the lateral half of the graft on 05/13/2015. Of note she's previously had her left axillary artery stented at [facility]. She apparently had another procedure recently at [facility] and we will try to obtain those records. Regardless she's been having problems with her upper arm graft on the left and we've been asked to evaluate her for possible a right arm graft as a backup. She has never had access in the right arm before. She has had a previous vein map which shows that she does not appear to have adequate vein for a fistula in the right arm. She's also had an arterial study back in March which showed that she had normal anatomy on the right with triphasic Doppler signals.  She dialyzes on Tuesdays Thursdays and Saturdays  Past Medical History:  Diagnosis Date  . Anemia    low iron  . Constipation    when given iron  . Diabetes mellitus without complication ([place])    type 2  . Diabetic retinopathy ([place])   . H/O detached retina repair    bilateral  . Hypertension   . Pneumonia   . Renal disorder    dialysis on T/Th/Sa    Family History  Problem Relation Age of Onset  . Diabetes Maternal Grandmother     SOCIAL HISTORY: Social History  Substance Use Topics  . Smoking status: Never Smoker  . Smokeless tobacco: Never Used  . Alcohol use No    Allergies  Allergen Reactions  . Fluorescein Shortness Of Breath and Other (See Comments)    Hypertension  . Iodinated Diagnostic Agents Anaphylaxis, Hives and Swelling    Diffuse swelling  . Iohexol Anaphylaxis, Hives, Itching and Swelling    13 HR PRE-MEDS REQUIRED  . Latex Itching    Current Outpatient Prescriptions  Medication Sig Dispense Refill  . aspirin 81 MG tablet Take 81 mg  by mouth daily.    . B Complex-C-Folic Acid (DIALYVITE TABLET) TABS Take 1 tablet by mouth daily at 12 noon. Reported on 05/04/2015    . cinacalcet (SENSIPAR) 60 MG tablet Take 60 mg by mouth daily.    . clopidogrel (PLAVIX) 75 MG tablet Take 75 mg by mouth daily.    Marland Kitchen glucose blood (ONE TOUCH ULTRA TEST) test strip 1 each by Other route 2 (two) times daily. And lancets 2/day 100 each 12  . insulin NPH-regular Human (NOVOLIN 70/30) (70-30) 100 UNIT/ML injection Inject 30 Units into the skin daily with breakfast. And syringes 1/day 10 mL 11  . levonorgestrel (MIRENA) 20 MCG/24HR IUD 1 each by Intrauterine route once.    . lidocaine-prilocaine (EMLA) cream Apply 1 application topically daily as needed (dialysis).     . pioglitazone (ACTOS) 15 MG tablet Take 15 mg by mouth daily.    . sevelamer carbonate (RENVELA) 800 MG tablet Take 800 mg by mouth 2 (two) times daily.     . Insulin Pen Needle (DROPLET PEN NEEDLES) 31G X 5 MM MISC Use to inject insulin 3 times per day (Patient not taking: Reported on 11/09/2015) 300 each 2   No current facility-administered medications for this visit.     REVIEW OF SYSTEMS:  [X]  denotes positive finding, [ ]  denotes negative finding Cardiac  Comments:  Chest pain or chest pressure:    Shortness of breath upon exertion:    Short of breath when lying flat:    Irregular heart rhythm:        Vascular    Pain in calf, thigh, or hip brought on by ambulation:    Pain in feet at night that wakes you up from your sleep:     Blood clot in your veins:    Leg swelling:         Pulmonary    Oxygen at home:    Productive cough:     Wheezing:         Neurologic    Sudden weakness in arms or legs:     Sudden numbness in arms or legs:     Sudden onset of difficulty speaking or slurred speech:    Temporary loss of vision in one eye:     Problems with dizziness:         Gastrointestinal    Blood in stool:     Vomited blood:         Genitourinary    Burning when  urinating:     Blood in urine:        Psychiatric    Major depression:         Hematologic    Bleeding problems:    Problems with blood clotting too easily:        Skin    Rashes or ulcers:        Constitutional    Fever or chills:      PHYSICAL EXAM: Vitals:   11/09/15 1106  BP: 124/82  Pulse: 93  Resp: 16  Temp: 98.3 F (36.8 C)  TempSrc: Oral  SpO2: 100%  Weight: 173 lb (78.5 kg)  Height: 5\' 2"  (1.575 m)    GENERAL: The patient is a well-nourished female, in no acute distress. The vital signs are documented above. CARDIAC: There is a regular rate and rhythm.  VASCULAR: She has a diminished right radial pulse. She does have Doppler signals in the radial and ulnar positions on the right although they are monophasic on my exam. PULMONARY: There is good air exchange bilaterally without wheezing or rales. ABDOMEN: Soft and non-tender with normal pitched bowel sounds.  MUSCULOSKELETAL: There are no major deformities or cyanosis. NEUROLOGIC: No focal weakness or paresthesias are detected. SKIN: There are no ulcers or rashes noted. PSYCHIATRIC: The patient has a normal affect.  DATA:   I have reviewed her previous arterial Doppler study that was done in March 2017 which showed a triphasic radial and ulnar signal. There right brachial artery bifurcation was at the antecubital level. I've also reviewed her vein map from 05/04/2015 which shows that her upper arm and forearm cephalic veins on the right are quite small and likely not usable for fistula. Her basilic vein likewise is small on the right.  MEDICAL ISSUES:  STAGE V CHRONIC KIDNEY DISEASE: I think that she would be a candidate for a right arm graft. This has been scheduled for 11/25/2015. In the meantime we will try to obtain her records from [facility]. I have discussed the procedure that'll cover locations with the patient and she is agreeable to proceed.    Deitra Mayo [facility] [place] [phone]

## 2015-11-25 NOTE — Op Note (Signed)
    NAME: Morgan [name]  MRN: [id number] DOB: 1970/03/27    DATE OF OPERATION: 11/25/2015  PREOP DIAGNOSIS: Stage V chronic kidney disease  POSTOP DIAGNOSIS: Same  PROCEDURE: Right upper arm AV graft (4-7 mm PTFE graft)  SURGEON: Judeth Cornfield. Scot Dock, MD, FACS  ASSIST: Leontine Locket, PA  ANESTHESIA: Local with sedation   EBL: 200 cc  INDICATIONS: Morgan [name] is a 45 y.o. female who presents for new access. Preoperative arterial Doppler study shows triphasic Doppler signals in the radial and ulnar positions. The brachial artery bifurcation was at the antecubital level. She did not have any adequate veins for a fistula.  FINDINGS: Very small antecubital veins therefore an upper arm graft was placed.  TECHNIQUE: The patient was taken to the operative room and I looked at the veins myself with the ultrasound and based on this patient was clearly not a candidate for a fistula. The right upper extremity was prepped and draped in usual sterile fashion. After the skin was anesthetized with lidocaine a longitudinal incision was made over the brachial artery just above the antecubital level. The adjacent veins were very small and not adequate in size. I therefore elected to place an upper arm graft. Separate longitudinal incision was made beneath the axilla after the skin was anesthetized. A 4-7 mm PTFE graft was tunneled in the upper arm after the skin was anesthetized. The patient was then heparinized. The brachial artery was clamped proximally and distally and longitudinal arteriotomy was made. A segment of the 4 mm end of the graft was excised the graft slightly spatulated and sewn end-to-side to the brachial artery using continuous 6-0 Prolene suture. Next the graft was pulled the appropriate length for anastomosis to the high brachial vein. The vein was ligated distally and clamped proximally. It was spatulated. The graft was cut the appropriate length, spatulated, and sewn  end-to-end into the vein using continuous 6-0 Prolene suture. At the completion was a good thrill in the fistula. There was a radial and ulnar signal with the Doppler. She was complaining of the hand being cold with some paresthesias. With time this improved. I did place a 2-0 silk around the proximal graft and tied this slightly to narrow this area slightly to help prevent steal. There was a reasonable radial and ulnar signal with the Doppler. Heparin was partially reversed with protamine. Each of the wounds was closed with a [place] Vicryl and the skin closed with 4-0 Vicryl. Dermabond was applied. The patient tolerated the procedure well and transferred to the recovery room in stable condition. All needle and sponge counts were correct.  Deitra Mayo, MD, FACS [facility]  DATE OF DICTATION:   11/25/2015

## 2015-11-25 NOTE — Telephone Encounter (Signed)
LVM on mobile #, will mail lttr for appt on 10/18 for f/u with the Doc

## 2015-11-26 DIAGNOSIS — N2581 Secondary hyperparathyroidism of renal origin: Secondary | ICD-10-CM | POA: Diagnosis not present

## 2015-11-26 DIAGNOSIS — N186 End stage renal disease: Secondary | ICD-10-CM | POA: Diagnosis not present

## 2015-11-26 DIAGNOSIS — Z992 Dependence on renal dialysis: Secondary | ICD-10-CM | POA: Diagnosis not present

## 2015-11-26 DIAGNOSIS — E1129 Type 2 diabetes mellitus with other diabetic kidney complication: Secondary | ICD-10-CM | POA: Diagnosis not present

## 2015-11-26 DIAGNOSIS — Z23 Encounter for immunization: Secondary | ICD-10-CM | POA: Diagnosis not present

## 2015-11-26 DIAGNOSIS — D631 Anemia in chronic kidney disease: Secondary | ICD-10-CM | POA: Diagnosis not present

## 2015-11-27 ENCOUNTER — Encounter (HOSPITAL_COMMUNITY): Payer: Self-pay | Admitting: Vascular Surgery

## 2015-11-28 ENCOUNTER — Ambulatory Visit (HOSPITAL_COMMUNITY): Payer: Medicare Other | Attending: Cardiology

## 2015-11-28 ENCOUNTER — Other Ambulatory Visit: Payer: Self-pay

## 2015-11-28 DIAGNOSIS — Z01810 Encounter for preprocedural cardiovascular examination: Secondary | ICD-10-CM | POA: Diagnosis not present

## 2015-11-29 DIAGNOSIS — N186 End stage renal disease: Secondary | ICD-10-CM | POA: Diagnosis not present

## 2015-11-29 DIAGNOSIS — N2581 Secondary hyperparathyroidism of renal origin: Secondary | ICD-10-CM | POA: Diagnosis not present

## 2015-11-29 DIAGNOSIS — Z23 Encounter for immunization: Secondary | ICD-10-CM | POA: Diagnosis not present

## 2015-11-29 DIAGNOSIS — D631 Anemia in chronic kidney disease: Secondary | ICD-10-CM | POA: Diagnosis not present

## 2015-12-01 DIAGNOSIS — N186 End stage renal disease: Secondary | ICD-10-CM | POA: Diagnosis not present

## 2015-12-01 DIAGNOSIS — N2581 Secondary hyperparathyroidism of renal origin: Secondary | ICD-10-CM | POA: Diagnosis not present

## 2015-12-01 DIAGNOSIS — Z23 Encounter for immunization: Secondary | ICD-10-CM | POA: Diagnosis not present

## 2015-12-01 DIAGNOSIS — D631 Anemia in chronic kidney disease: Secondary | ICD-10-CM | POA: Diagnosis not present

## 2015-12-02 DIAGNOSIS — Z01419 Encounter for gynecological examination (general) (routine) without abnormal findings: Secondary | ICD-10-CM | POA: Diagnosis not present

## 2015-12-03 DIAGNOSIS — Z23 Encounter for immunization: Secondary | ICD-10-CM | POA: Diagnosis not present

## 2015-12-03 DIAGNOSIS — D631 Anemia in chronic kidney disease: Secondary | ICD-10-CM | POA: Diagnosis not present

## 2015-12-03 DIAGNOSIS — N2581 Secondary hyperparathyroidism of renal origin: Secondary | ICD-10-CM | POA: Diagnosis not present

## 2015-12-03 DIAGNOSIS — N186 End stage renal disease: Secondary | ICD-10-CM | POA: Diagnosis not present

## 2015-12-05 DIAGNOSIS — F4323 Adjustment disorder with mixed anxiety and depressed mood: Secondary | ICD-10-CM | POA: Diagnosis not present

## 2015-12-06 DIAGNOSIS — D631 Anemia in chronic kidney disease: Secondary | ICD-10-CM | POA: Diagnosis not present

## 2015-12-06 DIAGNOSIS — N2581 Secondary hyperparathyroidism of renal origin: Secondary | ICD-10-CM | POA: Diagnosis not present

## 2015-12-06 DIAGNOSIS — Z23 Encounter for immunization: Secondary | ICD-10-CM | POA: Diagnosis not present

## 2015-12-06 DIAGNOSIS — N186 End stage renal disease: Secondary | ICD-10-CM | POA: Diagnosis not present

## 2015-12-09 DIAGNOSIS — T82858D Stenosis of vascular prosthetic devices, implants and grafts, subsequent encounter: Secondary | ICD-10-CM | POA: Diagnosis not present

## 2015-12-09 DIAGNOSIS — Z23 Encounter for immunization: Secondary | ICD-10-CM | POA: Diagnosis not present

## 2015-12-09 DIAGNOSIS — D631 Anemia in chronic kidney disease: Secondary | ICD-10-CM | POA: Diagnosis not present

## 2015-12-09 DIAGNOSIS — N2581 Secondary hyperparathyroidism of renal origin: Secondary | ICD-10-CM | POA: Diagnosis not present

## 2015-12-09 DIAGNOSIS — Z992 Dependence on renal dialysis: Secondary | ICD-10-CM | POA: Diagnosis not present

## 2015-12-09 DIAGNOSIS — N186 End stage renal disease: Secondary | ICD-10-CM | POA: Diagnosis not present

## 2015-12-09 DIAGNOSIS — I871 Compression of vein: Secondary | ICD-10-CM | POA: Diagnosis not present

## 2015-12-10 DIAGNOSIS — N186 End stage renal disease: Secondary | ICD-10-CM | POA: Diagnosis not present

## 2015-12-10 DIAGNOSIS — D631 Anemia in chronic kidney disease: Secondary | ICD-10-CM | POA: Diagnosis not present

## 2015-12-10 DIAGNOSIS — Z23 Encounter for immunization: Secondary | ICD-10-CM | POA: Diagnosis not present

## 2015-12-10 DIAGNOSIS — N2581 Secondary hyperparathyroidism of renal origin: Secondary | ICD-10-CM | POA: Diagnosis not present

## 2015-12-13 ENCOUNTER — Encounter: Payer: Self-pay | Admitting: Vascular Surgery

## 2015-12-13 DIAGNOSIS — N186 End stage renal disease: Secondary | ICD-10-CM | POA: Diagnosis not present

## 2015-12-13 DIAGNOSIS — Z23 Encounter for immunization: Secondary | ICD-10-CM | POA: Diagnosis not present

## 2015-12-13 DIAGNOSIS — D631 Anemia in chronic kidney disease: Secondary | ICD-10-CM | POA: Diagnosis not present

## 2015-12-13 DIAGNOSIS — N2581 Secondary hyperparathyroidism of renal origin: Secondary | ICD-10-CM | POA: Diagnosis not present

## 2015-12-14 ENCOUNTER — Ambulatory Visit (INDEPENDENT_AMBULATORY_CARE_PROVIDER_SITE_OTHER): Payer: Medicare Other | Admitting: Vascular Surgery

## 2015-12-14 ENCOUNTER — Encounter: Payer: Self-pay | Admitting: Vascular Surgery

## 2015-12-14 VITALS — HR 97 | Temp 98.9°F | Resp 18 | Ht 62.0 in | Wt 174.1 lb

## 2015-12-14 DIAGNOSIS — Z48812 Encounter for surgical aftercare following surgery on the circulatory system: Secondary | ICD-10-CM

## 2015-12-14 NOTE — Progress Notes (Signed)
Patient name: Morgan [name] MRN: [id number] DOB: Jul 16, 1970 Sex: female  REASON FOR VISIT: Follow up after right upper arm AV graft.  HPI: Morgan [name] is a 45 y.o. female who had a new right upper arm graft placed on 11/25/2015. She had very small antecubital veins and therefore an upper arm loop graft was placed. I felt she was at increased risk for steal because of the small size of her artery. She comes in for routine follow up visit.   She does have some mild steal symptoms. She describes paresthesias in her fingertips. The symptoms have gradually been improving. She continues to use her left arm graft for dialysis. She did undergo a recent thrombolysis at [facility].   Current Outpatient Prescriptions  Medication Sig Dispense Refill  . aspirin 81 MG tablet Take 81 mg by mouth daily.    . B Complex-C-Folic Acid (RENA-VITE PO) Take 1 tablet by mouth daily.    . cinacalcet (SENSIPAR) 60 MG tablet Take 60 mg by mouth daily.    . clopidogrel (PLAVIX) 75 MG tablet Take 75 mg by mouth daily.    Marland Kitchen glucose blood (ONE TOUCH ULTRA TEST) test strip 1 each by Other route 2 (two) times daily. And lancets 2/day 100 each 12  . insulin NPH-regular Human (NOVOLIN 70/30) (70-30) 100 UNIT/ML injection Inject 35 Units into the skin daily with breakfast. And syringes 1/day 10 mL 11  . levonorgestrel (MIRENA) 20 MCG/24HR IUD 1 each by Intrauterine route once.    . lidocaine-prilocaine (EMLA) cream Apply 1 application topically daily as needed (dialysis).     Marland Kitchen oxyCODONE-acetaminophen (PERCOCET) 5-325 MG tablet Take 1 tablet by mouth every 6 (six) hours as needed for severe pain. 10 tablet 0  . pioglitazone (ACTOS) 15 MG tablet Take 15 mg by mouth daily.    . sevelamer carbonate (RENVELA) 800 MG tablet Take 4,000 mg by mouth 3 (three) times daily with meals.      No current facility-administered medications for this visit.     REVIEW OF SYSTEMS:  [X]  denotes positive finding, [ ]   denotes negative finding Cardiac  Comments:  Chest pain or chest pressure:    Shortness of breath upon exertion:    Short of breath when lying flat:    Irregular heart rhythm:    Constitutional    Fever or chills:      PHYSICAL EXAM: Vitals:   12/14/15 1455  Pulse: 97  Resp: 18  Temp: 98.9 F (37.2 C)  TempSrc: Oral  SpO2: 100%  Weight: 174 lb 1.6 oz (79 kg)  Height: 5\' 2"  (1.575 m)    GENERAL: The patient is a well-nourished female, in no acute distress. The vital signs are documented above. CARDIOVASCULAR: There is a regular rate and rhythm. PULMONARY: There is good air exchange bilaterally without wheezing or rales. She has a good thrill in her left upper arm graft. She also has a good thrill in her right upper arm graft. I cannot palpate a radial pulse in the right. She does have a reasonable radial, ulnar, and palmaris arch signal in the right hand with the Doppler.  MEDICAL ISSUES:  STATUS POST RIGHT UPPER ARM AV GRAFT: Her right upper arm graft is working well and could potentially be used for dialysis starting October 30. She does have some mild steal symptoms in the right upper extremity. I did use a 4-7 mm PTFE graft and left most of the 4 mm end. Thus there is really  no way to band this or revise it if her steal symptoms progress. The only option would be a DRIL procedure. I've encouraged her to continue to exercise her arm. She knows to call if her symptoms progress and do not continue to improve.  Deitra Mayo [facility] [place] [phone]

## 2015-12-15 DIAGNOSIS — N186 End stage renal disease: Secondary | ICD-10-CM | POA: Diagnosis not present

## 2015-12-15 DIAGNOSIS — D631 Anemia in chronic kidney disease: Secondary | ICD-10-CM | POA: Diagnosis not present

## 2015-12-15 DIAGNOSIS — Z23 Encounter for immunization: Secondary | ICD-10-CM | POA: Diagnosis not present

## 2015-12-15 DIAGNOSIS — N2581 Secondary hyperparathyroidism of renal origin: Secondary | ICD-10-CM | POA: Diagnosis not present

## 2015-12-17 DIAGNOSIS — D631 Anemia in chronic kidney disease: Secondary | ICD-10-CM | POA: Diagnosis not present

## 2015-12-17 DIAGNOSIS — N2581 Secondary hyperparathyroidism of renal origin: Secondary | ICD-10-CM | POA: Diagnosis not present

## 2015-12-17 DIAGNOSIS — N186 End stage renal disease: Secondary | ICD-10-CM | POA: Diagnosis not present

## 2015-12-17 DIAGNOSIS — Z23 Encounter for immunization: Secondary | ICD-10-CM | POA: Diagnosis not present

## 2015-12-20 DIAGNOSIS — N186 End stage renal disease: Secondary | ICD-10-CM | POA: Diagnosis not present

## 2015-12-20 DIAGNOSIS — D631 Anemia in chronic kidney disease: Secondary | ICD-10-CM | POA: Diagnosis not present

## 2015-12-20 DIAGNOSIS — Z23 Encounter for immunization: Secondary | ICD-10-CM | POA: Diagnosis not present

## 2015-12-20 DIAGNOSIS — N2581 Secondary hyperparathyroidism of renal origin: Secondary | ICD-10-CM | POA: Diagnosis not present

## 2015-12-22 DIAGNOSIS — N186 End stage renal disease: Secondary | ICD-10-CM | POA: Diagnosis not present

## 2015-12-22 DIAGNOSIS — N2581 Secondary hyperparathyroidism of renal origin: Secondary | ICD-10-CM | POA: Diagnosis not present

## 2015-12-22 DIAGNOSIS — D631 Anemia in chronic kidney disease: Secondary | ICD-10-CM | POA: Diagnosis not present

## 2015-12-22 DIAGNOSIS — Z23 Encounter for immunization: Secondary | ICD-10-CM | POA: Diagnosis not present

## 2015-12-22 DIAGNOSIS — E1129 Type 2 diabetes mellitus with other diabetic kidney complication: Secondary | ICD-10-CM | POA: Diagnosis not present

## 2015-12-24 DIAGNOSIS — Z23 Encounter for immunization: Secondary | ICD-10-CM | POA: Diagnosis not present

## 2015-12-24 DIAGNOSIS — N186 End stage renal disease: Secondary | ICD-10-CM | POA: Diagnosis not present

## 2015-12-24 DIAGNOSIS — D631 Anemia in chronic kidney disease: Secondary | ICD-10-CM | POA: Diagnosis not present

## 2015-12-24 DIAGNOSIS — N2581 Secondary hyperparathyroidism of renal origin: Secondary | ICD-10-CM | POA: Diagnosis not present

## 2015-12-27 DIAGNOSIS — E1129 Type 2 diabetes mellitus with other diabetic kidney complication: Secondary | ICD-10-CM | POA: Diagnosis not present

## 2015-12-27 DIAGNOSIS — D631 Anemia in chronic kidney disease: Secondary | ICD-10-CM | POA: Diagnosis not present

## 2015-12-27 DIAGNOSIS — N2581 Secondary hyperparathyroidism of renal origin: Secondary | ICD-10-CM | POA: Diagnosis not present

## 2015-12-27 DIAGNOSIS — N186 End stage renal disease: Secondary | ICD-10-CM | POA: Diagnosis not present

## 2015-12-27 DIAGNOSIS — Z992 Dependence on renal dialysis: Secondary | ICD-10-CM | POA: Diagnosis not present

## 2015-12-27 DIAGNOSIS — Z23 Encounter for immunization: Secondary | ICD-10-CM | POA: Diagnosis not present

## 2015-12-29 DIAGNOSIS — D631 Anemia in chronic kidney disease: Secondary | ICD-10-CM | POA: Diagnosis not present

## 2015-12-29 DIAGNOSIS — N186 End stage renal disease: Secondary | ICD-10-CM | POA: Diagnosis not present

## 2015-12-29 DIAGNOSIS — N2581 Secondary hyperparathyroidism of renal origin: Secondary | ICD-10-CM | POA: Diagnosis not present

## 2015-12-29 DIAGNOSIS — E1129 Type 2 diabetes mellitus with other diabetic kidney complication: Secondary | ICD-10-CM | POA: Diagnosis not present

## 2015-12-30 ENCOUNTER — Telehealth: Payer: Self-pay | Admitting: Endocrinology

## 2015-12-30 MED ORDER — PIOGLITAZONE HCL 15 MG PO TABS: 15.0000 mg | ORAL_TABLET | Freq: Every day | ORAL | 5 refills | Status: DC

## 2015-12-30 NOTE — Telephone Encounter (Signed)
actos needs to be called into [facility] on [address] please

## 2015-12-30 NOTE — Telephone Encounter (Signed)
Please refill prn 

## 2015-12-30 NOTE — Telephone Encounter (Signed)
See message and please advise if ok to refill. Medication is listed under a historical provider. Thanks!

## 2015-12-30 NOTE — Telephone Encounter (Signed)
Refill submitted per patient's request.  

## 2015-12-31 DIAGNOSIS — N186 End stage renal disease: Secondary | ICD-10-CM | POA: Diagnosis not present

## 2015-12-31 DIAGNOSIS — D631 Anemia in chronic kidney disease: Secondary | ICD-10-CM | POA: Diagnosis not present

## 2015-12-31 DIAGNOSIS — N2581 Secondary hyperparathyroidism of renal origin: Secondary | ICD-10-CM | POA: Diagnosis not present

## 2015-12-31 DIAGNOSIS — E1129 Type 2 diabetes mellitus with other diabetic kidney complication: Secondary | ICD-10-CM | POA: Diagnosis not present

## 2016-01-03 DIAGNOSIS — N2581 Secondary hyperparathyroidism of renal origin: Secondary | ICD-10-CM | POA: Diagnosis not present

## 2016-01-03 DIAGNOSIS — N186 End stage renal disease: Secondary | ICD-10-CM | POA: Diagnosis not present

## 2016-01-03 DIAGNOSIS — E1129 Type 2 diabetes mellitus with other diabetic kidney complication: Secondary | ICD-10-CM | POA: Diagnosis not present

## 2016-01-03 DIAGNOSIS — D631 Anemia in chronic kidney disease: Secondary | ICD-10-CM | POA: Diagnosis not present

## 2016-01-05 DIAGNOSIS — N186 End stage renal disease: Secondary | ICD-10-CM | POA: Diagnosis not present

## 2016-01-05 DIAGNOSIS — E1129 Type 2 diabetes mellitus with other diabetic kidney complication: Secondary | ICD-10-CM | POA: Diagnosis not present

## 2016-01-05 DIAGNOSIS — D631 Anemia in chronic kidney disease: Secondary | ICD-10-CM | POA: Diagnosis not present

## 2016-01-05 DIAGNOSIS — N2581 Secondary hyperparathyroidism of renal origin: Secondary | ICD-10-CM | POA: Diagnosis not present

## 2016-01-07 DIAGNOSIS — D631 Anemia in chronic kidney disease: Secondary | ICD-10-CM | POA: Diagnosis not present

## 2016-01-07 DIAGNOSIS — E1129 Type 2 diabetes mellitus with other diabetic kidney complication: Secondary | ICD-10-CM | POA: Diagnosis not present

## 2016-01-07 DIAGNOSIS — N2581 Secondary hyperparathyroidism of renal origin: Secondary | ICD-10-CM | POA: Diagnosis not present

## 2016-01-07 DIAGNOSIS — N186 End stage renal disease: Secondary | ICD-10-CM | POA: Diagnosis not present

## 2016-01-09 DIAGNOSIS — F4323 Adjustment disorder with mixed anxiety and depressed mood: Secondary | ICD-10-CM | POA: Diagnosis not present

## 2016-01-10 DIAGNOSIS — D631 Anemia in chronic kidney disease: Secondary | ICD-10-CM | POA: Diagnosis not present

## 2016-01-10 DIAGNOSIS — N186 End stage renal disease: Secondary | ICD-10-CM | POA: Diagnosis not present

## 2016-01-10 DIAGNOSIS — N2581 Secondary hyperparathyroidism of renal origin: Secondary | ICD-10-CM | POA: Diagnosis not present

## 2016-01-10 DIAGNOSIS — E1129 Type 2 diabetes mellitus with other diabetic kidney complication: Secondary | ICD-10-CM | POA: Diagnosis not present

## 2016-01-12 DIAGNOSIS — N2581 Secondary hyperparathyroidism of renal origin: Secondary | ICD-10-CM | POA: Diagnosis not present

## 2016-01-12 DIAGNOSIS — E1129 Type 2 diabetes mellitus with other diabetic kidney complication: Secondary | ICD-10-CM | POA: Diagnosis not present

## 2016-01-12 DIAGNOSIS — N186 End stage renal disease: Secondary | ICD-10-CM | POA: Diagnosis not present

## 2016-01-12 DIAGNOSIS — D631 Anemia in chronic kidney disease: Secondary | ICD-10-CM | POA: Diagnosis not present

## 2016-01-14 DIAGNOSIS — E1129 Type 2 diabetes mellitus with other diabetic kidney complication: Secondary | ICD-10-CM | POA: Diagnosis not present

## 2016-01-14 DIAGNOSIS — D631 Anemia in chronic kidney disease: Secondary | ICD-10-CM | POA: Diagnosis not present

## 2016-01-14 DIAGNOSIS — N2581 Secondary hyperparathyroidism of renal origin: Secondary | ICD-10-CM | POA: Diagnosis not present

## 2016-01-14 DIAGNOSIS — N186 End stage renal disease: Secondary | ICD-10-CM | POA: Diagnosis not present

## 2016-01-16 DIAGNOSIS — E1129 Type 2 diabetes mellitus with other diabetic kidney complication: Secondary | ICD-10-CM | POA: Diagnosis not present

## 2016-01-16 DIAGNOSIS — N2581 Secondary hyperparathyroidism of renal origin: Secondary | ICD-10-CM | POA: Diagnosis not present

## 2016-01-16 DIAGNOSIS — N186 End stage renal disease: Secondary | ICD-10-CM | POA: Diagnosis not present

## 2016-01-16 DIAGNOSIS — D631 Anemia in chronic kidney disease: Secondary | ICD-10-CM | POA: Diagnosis not present

## 2016-01-17 DIAGNOSIS — Z794 Long term (current) use of insulin: Secondary | ICD-10-CM | POA: Diagnosis not present

## 2016-01-17 DIAGNOSIS — N186 End stage renal disease: Secondary | ICD-10-CM | POA: Diagnosis not present

## 2016-01-17 DIAGNOSIS — I739 Peripheral vascular disease, unspecified: Secondary | ICD-10-CM | POA: Diagnosis not present

## 2016-01-17 DIAGNOSIS — R55 Syncope and collapse: Secondary | ICD-10-CM | POA: Diagnosis not present

## 2016-01-17 DIAGNOSIS — N189 Chronic kidney disease, unspecified: Secondary | ICD-10-CM | POA: Diagnosis not present

## 2016-01-17 DIAGNOSIS — I871 Compression of vein: Secondary | ICD-10-CM | POA: Diagnosis not present

## 2016-01-17 DIAGNOSIS — E118 Type 2 diabetes mellitus with unspecified complications: Secondary | ICD-10-CM | POA: Diagnosis not present

## 2016-01-17 DIAGNOSIS — I10 Essential (primary) hypertension: Secondary | ICD-10-CM | POA: Diagnosis not present

## 2016-01-18 DIAGNOSIS — D631 Anemia in chronic kidney disease: Secondary | ICD-10-CM | POA: Diagnosis not present

## 2016-01-18 DIAGNOSIS — E1129 Type 2 diabetes mellitus with other diabetic kidney complication: Secondary | ICD-10-CM | POA: Diagnosis not present

## 2016-01-18 DIAGNOSIS — N186 End stage renal disease: Secondary | ICD-10-CM | POA: Diagnosis not present

## 2016-01-18 DIAGNOSIS — N2581 Secondary hyperparathyroidism of renal origin: Secondary | ICD-10-CM | POA: Diagnosis not present

## 2016-01-20 DIAGNOSIS — N186 End stage renal disease: Secondary | ICD-10-CM | POA: Diagnosis not present

## 2016-01-20 DIAGNOSIS — T82868A Thrombosis of vascular prosthetic devices, implants and grafts, initial encounter: Secondary | ICD-10-CM | POA: Diagnosis not present

## 2016-01-20 DIAGNOSIS — Z992 Dependence on renal dialysis: Secondary | ICD-10-CM | POA: Diagnosis not present

## 2016-01-20 DIAGNOSIS — I871 Compression of vein: Secondary | ICD-10-CM | POA: Diagnosis not present

## 2016-01-21 DIAGNOSIS — E1129 Type 2 diabetes mellitus with other diabetic kidney complication: Secondary | ICD-10-CM | POA: Diagnosis not present

## 2016-01-21 DIAGNOSIS — D631 Anemia in chronic kidney disease: Secondary | ICD-10-CM | POA: Diagnosis not present

## 2016-01-21 DIAGNOSIS — N186 End stage renal disease: Secondary | ICD-10-CM | POA: Diagnosis not present

## 2016-01-21 DIAGNOSIS — N2581 Secondary hyperparathyroidism of renal origin: Secondary | ICD-10-CM | POA: Diagnosis not present

## 2016-01-24 DIAGNOSIS — N2581 Secondary hyperparathyroidism of renal origin: Secondary | ICD-10-CM | POA: Diagnosis not present

## 2016-01-24 DIAGNOSIS — D631 Anemia in chronic kidney disease: Secondary | ICD-10-CM | POA: Diagnosis not present

## 2016-01-24 DIAGNOSIS — E1129 Type 2 diabetes mellitus with other diabetic kidney complication: Secondary | ICD-10-CM | POA: Diagnosis not present

## 2016-01-24 DIAGNOSIS — N186 End stage renal disease: Secondary | ICD-10-CM | POA: Diagnosis not present

## 2016-01-26 DIAGNOSIS — N186 End stage renal disease: Secondary | ICD-10-CM | POA: Diagnosis not present

## 2016-01-26 DIAGNOSIS — Z992 Dependence on renal dialysis: Secondary | ICD-10-CM | POA: Diagnosis not present

## 2016-01-26 DIAGNOSIS — N2581 Secondary hyperparathyroidism of renal origin: Secondary | ICD-10-CM | POA: Diagnosis not present

## 2016-01-26 DIAGNOSIS — D631 Anemia in chronic kidney disease: Secondary | ICD-10-CM | POA: Diagnosis not present

## 2016-01-26 DIAGNOSIS — E1129 Type 2 diabetes mellitus with other diabetic kidney complication: Secondary | ICD-10-CM | POA: Diagnosis not present

## 2016-01-28 DIAGNOSIS — N2581 Secondary hyperparathyroidism of renal origin: Secondary | ICD-10-CM | POA: Diagnosis not present

## 2016-01-28 DIAGNOSIS — N186 End stage renal disease: Secondary | ICD-10-CM | POA: Diagnosis not present

## 2016-01-28 DIAGNOSIS — E1129 Type 2 diabetes mellitus with other diabetic kidney complication: Secondary | ICD-10-CM | POA: Diagnosis not present

## 2016-01-29 NOTE — Progress Notes (Deleted)
   Subjective:    Patient ID: Morgan [name], female    DOB: 1970/09/08, 45 y.o.   MRN: [id number]  HPI Pt returns for f/u of diabetes mellitus: DM type: Insulin-requiring type 2 Dx'ed: 7672 Complications: polyneuropathy, proliferative retinopathy, and ESRD.  Therapy: insulin since dx GDM: never DKA: never Severe hypoglycemia: never.  Pancreatitis: never Other: She took an intentional insulin overdose in 2014, in a suicide attempt; therapy has been limited by noncompliance, so she is on a qd insulin schedule.    Interval history: Pt says she never misses the insulin.  no cbg record, but states cbg's vary from 78-200's.  It is in general higher as the day goes on.  She says cbg on dialysis days is similar to non-dialysis days.     Review of Systems     Objective:   Physical Exam VITAL SIGNS:  See vs page GENERAL: no distress Pulses: dorsalis pedis intact bilat.   MSK: no deformity of the feet.  CV: no leg edema Skin:  no ulcer on the feet.  normal color and temp on the feet. Neuro: sensation is intact to touch on the feet.         Assessment & Plan:

## 2016-01-30 ENCOUNTER — Ambulatory Visit: Payer: Self-pay | Admitting: Endocrinology

## 2016-01-31 DIAGNOSIS — N2581 Secondary hyperparathyroidism of renal origin: Secondary | ICD-10-CM | POA: Diagnosis not present

## 2016-01-31 DIAGNOSIS — N186 End stage renal disease: Secondary | ICD-10-CM | POA: Diagnosis not present

## 2016-01-31 DIAGNOSIS — E1129 Type 2 diabetes mellitus with other diabetic kidney complication: Secondary | ICD-10-CM | POA: Diagnosis not present

## 2016-02-02 DIAGNOSIS — N186 End stage renal disease: Secondary | ICD-10-CM | POA: Diagnosis not present

## 2016-02-02 DIAGNOSIS — N2581 Secondary hyperparathyroidism of renal origin: Secondary | ICD-10-CM | POA: Diagnosis not present

## 2016-02-02 DIAGNOSIS — E1129 Type 2 diabetes mellitus with other diabetic kidney complication: Secondary | ICD-10-CM | POA: Diagnosis not present

## 2016-02-04 DIAGNOSIS — N186 End stage renal disease: Secondary | ICD-10-CM | POA: Diagnosis not present

## 2016-02-04 DIAGNOSIS — E1129 Type 2 diabetes mellitus with other diabetic kidney complication: Secondary | ICD-10-CM | POA: Diagnosis not present

## 2016-02-04 DIAGNOSIS — N2581 Secondary hyperparathyroidism of renal origin: Secondary | ICD-10-CM | POA: Diagnosis not present

## 2016-02-07 DIAGNOSIS — E1129 Type 2 diabetes mellitus with other diabetic kidney complication: Secondary | ICD-10-CM | POA: Diagnosis not present

## 2016-02-07 DIAGNOSIS — N2581 Secondary hyperparathyroidism of renal origin: Secondary | ICD-10-CM | POA: Diagnosis not present

## 2016-02-07 DIAGNOSIS — N186 End stage renal disease: Secondary | ICD-10-CM | POA: Diagnosis not present

## 2016-02-09 DIAGNOSIS — E1129 Type 2 diabetes mellitus with other diabetic kidney complication: Secondary | ICD-10-CM | POA: Diagnosis not present

## 2016-02-09 DIAGNOSIS — N186 End stage renal disease: Secondary | ICD-10-CM | POA: Diagnosis not present

## 2016-02-09 DIAGNOSIS — N2581 Secondary hyperparathyroidism of renal origin: Secondary | ICD-10-CM | POA: Diagnosis not present

## 2016-02-11 DIAGNOSIS — N2581 Secondary hyperparathyroidism of renal origin: Secondary | ICD-10-CM | POA: Diagnosis not present

## 2016-02-11 DIAGNOSIS — N186 End stage renal disease: Secondary | ICD-10-CM | POA: Diagnosis not present

## 2016-02-11 DIAGNOSIS — E1129 Type 2 diabetes mellitus with other diabetic kidney complication: Secondary | ICD-10-CM | POA: Diagnosis not present

## 2016-02-14 DIAGNOSIS — N2581 Secondary hyperparathyroidism of renal origin: Secondary | ICD-10-CM | POA: Diagnosis not present

## 2016-02-14 DIAGNOSIS — N186 End stage renal disease: Secondary | ICD-10-CM | POA: Diagnosis not present

## 2016-02-14 DIAGNOSIS — E1129 Type 2 diabetes mellitus with other diabetic kidney complication: Secondary | ICD-10-CM | POA: Diagnosis not present

## 2016-02-16 DIAGNOSIS — N186 End stage renal disease: Secondary | ICD-10-CM | POA: Diagnosis not present

## 2016-02-16 DIAGNOSIS — E1129 Type 2 diabetes mellitus with other diabetic kidney complication: Secondary | ICD-10-CM | POA: Diagnosis not present

## 2016-02-16 DIAGNOSIS — N2581 Secondary hyperparathyroidism of renal origin: Secondary | ICD-10-CM | POA: Diagnosis not present

## 2016-02-18 DIAGNOSIS — N2581 Secondary hyperparathyroidism of renal origin: Secondary | ICD-10-CM | POA: Diagnosis not present

## 2016-02-18 DIAGNOSIS — E1129 Type 2 diabetes mellitus with other diabetic kidney complication: Secondary | ICD-10-CM | POA: Diagnosis not present

## 2016-02-18 DIAGNOSIS — N186 End stage renal disease: Secondary | ICD-10-CM | POA: Diagnosis not present

## 2016-02-21 DIAGNOSIS — E1129 Type 2 diabetes mellitus with other diabetic kidney complication: Secondary | ICD-10-CM | POA: Diagnosis not present

## 2016-02-21 DIAGNOSIS — N2581 Secondary hyperparathyroidism of renal origin: Secondary | ICD-10-CM | POA: Diagnosis not present

## 2016-02-21 DIAGNOSIS — N186 End stage renal disease: Secondary | ICD-10-CM | POA: Diagnosis not present

## 2016-02-23 DIAGNOSIS — N186 End stage renal disease: Secondary | ICD-10-CM | POA: Diagnosis not present

## 2016-02-23 DIAGNOSIS — E1129 Type 2 diabetes mellitus with other diabetic kidney complication: Secondary | ICD-10-CM | POA: Diagnosis not present

## 2016-02-23 DIAGNOSIS — N2581 Secondary hyperparathyroidism of renal origin: Secondary | ICD-10-CM | POA: Diagnosis not present

## 2016-02-25 DIAGNOSIS — N186 End stage renal disease: Secondary | ICD-10-CM | POA: Diagnosis not present

## 2016-02-25 DIAGNOSIS — N2581 Secondary hyperparathyroidism of renal origin: Secondary | ICD-10-CM | POA: Diagnosis not present

## 2016-02-25 DIAGNOSIS — E1129 Type 2 diabetes mellitus with other diabetic kidney complication: Secondary | ICD-10-CM | POA: Diagnosis not present

## 2016-02-26 DIAGNOSIS — Z992 Dependence on renal dialysis: Secondary | ICD-10-CM | POA: Diagnosis not present

## 2016-02-26 DIAGNOSIS — N186 End stage renal disease: Secondary | ICD-10-CM | POA: Diagnosis not present

## 2016-02-26 DIAGNOSIS — E1129 Type 2 diabetes mellitus with other diabetic kidney complication: Secondary | ICD-10-CM | POA: Diagnosis not present

## 2016-02-28 DIAGNOSIS — N2581 Secondary hyperparathyroidism of renal origin: Secondary | ICD-10-CM | POA: Diagnosis not present

## 2016-02-28 DIAGNOSIS — N186 End stage renal disease: Secondary | ICD-10-CM | POA: Diagnosis not present

## 2016-03-01 DIAGNOSIS — N2581 Secondary hyperparathyroidism of renal origin: Secondary | ICD-10-CM | POA: Diagnosis not present

## 2016-03-01 DIAGNOSIS — N186 End stage renal disease: Secondary | ICD-10-CM | POA: Diagnosis not present

## 2016-03-03 DIAGNOSIS — N186 End stage renal disease: Secondary | ICD-10-CM | POA: Diagnosis not present

## 2016-03-03 DIAGNOSIS — N2581 Secondary hyperparathyroidism of renal origin: Secondary | ICD-10-CM | POA: Diagnosis not present

## 2016-03-05 DIAGNOSIS — F4323 Adjustment disorder with mixed anxiety and depressed mood: Secondary | ICD-10-CM | POA: Diagnosis not present

## 2016-03-06 DIAGNOSIS — N2581 Secondary hyperparathyroidism of renal origin: Secondary | ICD-10-CM | POA: Diagnosis not present

## 2016-03-06 DIAGNOSIS — N186 End stage renal disease: Secondary | ICD-10-CM | POA: Diagnosis not present

## 2016-03-08 DIAGNOSIS — H25813 Combined forms of age-related cataract, bilateral: Secondary | ICD-10-CM | POA: Insufficient documentation

## 2016-03-08 DIAGNOSIS — N2581 Secondary hyperparathyroidism of renal origin: Secondary | ICD-10-CM | POA: Diagnosis not present

## 2016-03-08 DIAGNOSIS — N186 End stage renal disease: Secondary | ICD-10-CM | POA: Diagnosis not present

## 2016-03-08 DIAGNOSIS — H35341 Macular cyst, hole, or pseudohole, right eye: Secondary | ICD-10-CM | POA: Diagnosis not present

## 2016-03-08 DIAGNOSIS — E113599 Type 2 diabetes mellitus with proliferative diabetic retinopathy without macular edema, unspecified eye: Secondary | ICD-10-CM | POA: Diagnosis not present

## 2016-03-09 DIAGNOSIS — Z992 Dependence on renal dialysis: Secondary | ICD-10-CM | POA: Diagnosis not present

## 2016-03-09 DIAGNOSIS — N186 End stage renal disease: Secondary | ICD-10-CM | POA: Diagnosis not present

## 2016-03-09 DIAGNOSIS — I871 Compression of vein: Secondary | ICD-10-CM | POA: Diagnosis not present

## 2016-03-09 DIAGNOSIS — T82858A Stenosis of vascular prosthetic devices, implants and grafts, initial encounter: Secondary | ICD-10-CM | POA: Diagnosis not present

## 2016-03-10 DIAGNOSIS — N2581 Secondary hyperparathyroidism of renal origin: Secondary | ICD-10-CM | POA: Diagnosis not present

## 2016-03-10 DIAGNOSIS — N186 End stage renal disease: Secondary | ICD-10-CM | POA: Diagnosis not present

## 2016-03-13 DIAGNOSIS — N2581 Secondary hyperparathyroidism of renal origin: Secondary | ICD-10-CM | POA: Diagnosis not present

## 2016-03-13 DIAGNOSIS — N186 End stage renal disease: Secondary | ICD-10-CM | POA: Diagnosis not present

## 2016-03-15 DIAGNOSIS — N186 End stage renal disease: Secondary | ICD-10-CM | POA: Diagnosis not present

## 2016-03-15 DIAGNOSIS — N2581 Secondary hyperparathyroidism of renal origin: Secondary | ICD-10-CM | POA: Diagnosis not present

## 2016-03-17 DIAGNOSIS — N2581 Secondary hyperparathyroidism of renal origin: Secondary | ICD-10-CM | POA: Diagnosis not present

## 2016-03-17 DIAGNOSIS — N186 End stage renal disease: Secondary | ICD-10-CM | POA: Diagnosis not present

## 2016-03-20 DIAGNOSIS — N2581 Secondary hyperparathyroidism of renal origin: Secondary | ICD-10-CM | POA: Diagnosis not present

## 2016-03-20 DIAGNOSIS — N186 End stage renal disease: Secondary | ICD-10-CM | POA: Diagnosis not present

## 2016-03-22 DIAGNOSIS — E1129 Type 2 diabetes mellitus with other diabetic kidney complication: Secondary | ICD-10-CM | POA: Diagnosis not present

## 2016-03-22 DIAGNOSIS — N2581 Secondary hyperparathyroidism of renal origin: Secondary | ICD-10-CM | POA: Diagnosis not present

## 2016-03-22 DIAGNOSIS — N186 End stage renal disease: Secondary | ICD-10-CM | POA: Diagnosis not present

## 2016-03-22 DIAGNOSIS — Z7682 Awaiting organ transplant status: Secondary | ICD-10-CM | POA: Diagnosis not present

## 2016-03-23 DIAGNOSIS — Z7682 Awaiting organ transplant status: Secondary | ICD-10-CM | POA: Diagnosis not present

## 2016-03-24 DIAGNOSIS — N2581 Secondary hyperparathyroidism of renal origin: Secondary | ICD-10-CM | POA: Diagnosis not present

## 2016-03-24 DIAGNOSIS — N186 End stage renal disease: Secondary | ICD-10-CM | POA: Diagnosis not present

## 2016-03-27 DIAGNOSIS — N186 End stage renal disease: Secondary | ICD-10-CM | POA: Diagnosis not present

## 2016-03-27 DIAGNOSIS — N2581 Secondary hyperparathyroidism of renal origin: Secondary | ICD-10-CM | POA: Diagnosis not present

## 2016-03-28 DIAGNOSIS — E1129 Type 2 diabetes mellitus with other diabetic kidney complication: Secondary | ICD-10-CM | POA: Diagnosis not present

## 2016-03-28 DIAGNOSIS — N186 End stage renal disease: Secondary | ICD-10-CM | POA: Diagnosis not present

## 2016-03-28 DIAGNOSIS — Z992 Dependence on renal dialysis: Secondary | ICD-10-CM | POA: Diagnosis not present

## 2016-03-29 DIAGNOSIS — E1129 Type 2 diabetes mellitus with other diabetic kidney complication: Secondary | ICD-10-CM | POA: Diagnosis not present

## 2016-03-29 DIAGNOSIS — N2581 Secondary hyperparathyroidism of renal origin: Secondary | ICD-10-CM | POA: Diagnosis not present

## 2016-03-29 DIAGNOSIS — N186 End stage renal disease: Secondary | ICD-10-CM | POA: Diagnosis not present

## 2016-03-31 DIAGNOSIS — E1129 Type 2 diabetes mellitus with other diabetic kidney complication: Secondary | ICD-10-CM | POA: Diagnosis not present

## 2016-03-31 DIAGNOSIS — N2581 Secondary hyperparathyroidism of renal origin: Secondary | ICD-10-CM | POA: Diagnosis not present

## 2016-03-31 DIAGNOSIS — N186 End stage renal disease: Secondary | ICD-10-CM | POA: Diagnosis not present

## 2016-04-03 DIAGNOSIS — N2581 Secondary hyperparathyroidism of renal origin: Secondary | ICD-10-CM | POA: Diagnosis not present

## 2016-04-03 DIAGNOSIS — N186 End stage renal disease: Secondary | ICD-10-CM | POA: Diagnosis not present

## 2016-04-03 DIAGNOSIS — E1129 Type 2 diabetes mellitus with other diabetic kidney complication: Secondary | ICD-10-CM | POA: Diagnosis not present

## 2016-04-05 DIAGNOSIS — N2581 Secondary hyperparathyroidism of renal origin: Secondary | ICD-10-CM | POA: Diagnosis not present

## 2016-04-05 DIAGNOSIS — E1129 Type 2 diabetes mellitus with other diabetic kidney complication: Secondary | ICD-10-CM | POA: Diagnosis not present

## 2016-04-05 DIAGNOSIS — N186 End stage renal disease: Secondary | ICD-10-CM | POA: Diagnosis not present

## 2016-04-07 DIAGNOSIS — N186 End stage renal disease: Secondary | ICD-10-CM | POA: Diagnosis not present

## 2016-04-07 DIAGNOSIS — E1129 Type 2 diabetes mellitus with other diabetic kidney complication: Secondary | ICD-10-CM | POA: Diagnosis not present

## 2016-04-07 DIAGNOSIS — N2581 Secondary hyperparathyroidism of renal origin: Secondary | ICD-10-CM | POA: Diagnosis not present

## 2016-04-10 DIAGNOSIS — N186 End stage renal disease: Secondary | ICD-10-CM | POA: Diagnosis not present

## 2016-04-10 DIAGNOSIS — N2581 Secondary hyperparathyroidism of renal origin: Secondary | ICD-10-CM | POA: Diagnosis not present

## 2016-04-10 DIAGNOSIS — E1129 Type 2 diabetes mellitus with other diabetic kidney complication: Secondary | ICD-10-CM | POA: Diagnosis not present

## 2016-04-12 DIAGNOSIS — N186 End stage renal disease: Secondary | ICD-10-CM | POA: Diagnosis not present

## 2016-04-12 DIAGNOSIS — E1129 Type 2 diabetes mellitus with other diabetic kidney complication: Secondary | ICD-10-CM | POA: Diagnosis not present

## 2016-04-12 DIAGNOSIS — N2581 Secondary hyperparathyroidism of renal origin: Secondary | ICD-10-CM | POA: Diagnosis not present

## 2016-04-14 DIAGNOSIS — E1129 Type 2 diabetes mellitus with other diabetic kidney complication: Secondary | ICD-10-CM | POA: Diagnosis not present

## 2016-04-14 DIAGNOSIS — N186 End stage renal disease: Secondary | ICD-10-CM | POA: Diagnosis not present

## 2016-04-14 DIAGNOSIS — N2581 Secondary hyperparathyroidism of renal origin: Secondary | ICD-10-CM | POA: Diagnosis not present

## 2016-04-17 DIAGNOSIS — N186 End stage renal disease: Secondary | ICD-10-CM | POA: Diagnosis not present

## 2016-04-17 DIAGNOSIS — E1129 Type 2 diabetes mellitus with other diabetic kidney complication: Secondary | ICD-10-CM | POA: Diagnosis not present

## 2016-04-17 DIAGNOSIS — N2581 Secondary hyperparathyroidism of renal origin: Secondary | ICD-10-CM | POA: Diagnosis not present

## 2016-04-18 DIAGNOSIS — T82858A Stenosis of vascular prosthetic devices, implants and grafts, initial encounter: Secondary | ICD-10-CM | POA: Diagnosis not present

## 2016-04-18 DIAGNOSIS — Z992 Dependence on renal dialysis: Secondary | ICD-10-CM | POA: Diagnosis not present

## 2016-04-18 DIAGNOSIS — N186 End stage renal disease: Secondary | ICD-10-CM | POA: Diagnosis not present

## 2016-04-18 DIAGNOSIS — I871 Compression of vein: Secondary | ICD-10-CM | POA: Diagnosis not present

## 2016-04-19 DIAGNOSIS — N186 End stage renal disease: Secondary | ICD-10-CM | POA: Diagnosis not present

## 2016-04-19 DIAGNOSIS — N2581 Secondary hyperparathyroidism of renal origin: Secondary | ICD-10-CM | POA: Diagnosis not present

## 2016-04-19 DIAGNOSIS — E1129 Type 2 diabetes mellitus with other diabetic kidney complication: Secondary | ICD-10-CM | POA: Diagnosis not present

## 2016-04-21 DIAGNOSIS — N2581 Secondary hyperparathyroidism of renal origin: Secondary | ICD-10-CM | POA: Diagnosis not present

## 2016-04-21 DIAGNOSIS — N186 End stage renal disease: Secondary | ICD-10-CM | POA: Diagnosis not present

## 2016-04-21 DIAGNOSIS — E1129 Type 2 diabetes mellitus with other diabetic kidney complication: Secondary | ICD-10-CM | POA: Diagnosis not present

## 2016-04-24 DIAGNOSIS — E1129 Type 2 diabetes mellitus with other diabetic kidney complication: Secondary | ICD-10-CM | POA: Diagnosis not present

## 2016-04-24 DIAGNOSIS — N2581 Secondary hyperparathyroidism of renal origin: Secondary | ICD-10-CM | POA: Diagnosis not present

## 2016-04-24 DIAGNOSIS — N186 End stage renal disease: Secondary | ICD-10-CM | POA: Diagnosis not present

## 2016-04-25 DIAGNOSIS — E1129 Type 2 diabetes mellitus with other diabetic kidney complication: Secondary | ICD-10-CM | POA: Diagnosis not present

## 2016-04-25 DIAGNOSIS — Z992 Dependence on renal dialysis: Secondary | ICD-10-CM | POA: Diagnosis not present

## 2016-04-25 DIAGNOSIS — N186 End stage renal disease: Secondary | ICD-10-CM | POA: Diagnosis not present

## 2016-04-26 DIAGNOSIS — Z992 Dependence on renal dialysis: Secondary | ICD-10-CM | POA: Diagnosis not present

## 2016-04-26 DIAGNOSIS — N186 End stage renal disease: Secondary | ICD-10-CM | POA: Diagnosis not present

## 2016-04-26 DIAGNOSIS — N2581 Secondary hyperparathyroidism of renal origin: Secondary | ICD-10-CM | POA: Diagnosis not present

## 2016-04-26 DIAGNOSIS — E1129 Type 2 diabetes mellitus with other diabetic kidney complication: Secondary | ICD-10-CM | POA: Diagnosis not present

## 2016-04-27 DIAGNOSIS — Z7682 Awaiting organ transplant status: Secondary | ICD-10-CM | POA: Diagnosis not present

## 2016-04-27 DIAGNOSIS — N186 End stage renal disease: Secondary | ICD-10-CM | POA: Diagnosis not present

## 2016-04-27 DIAGNOSIS — Z992 Dependence on renal dialysis: Secondary | ICD-10-CM | POA: Diagnosis not present

## 2016-04-28 DIAGNOSIS — R739 Hyperglycemia, unspecified: Secondary | ICD-10-CM | POA: Diagnosis not present

## 2016-04-28 DIAGNOSIS — T8619 Other complication of kidney transplant: Secondary | ICD-10-CM | POA: Diagnosis not present

## 2016-04-28 DIAGNOSIS — T380X5A Adverse effect of glucocorticoids and synthetic analogues, initial encounter: Secondary | ICD-10-CM | POA: Diagnosis not present

## 2016-04-28 DIAGNOSIS — N186 End stage renal disease: Secondary | ICD-10-CM | POA: Diagnosis not present

## 2016-04-28 DIAGNOSIS — E1142 Type 2 diabetes mellitus with diabetic polyneuropathy: Secondary | ICD-10-CM | POA: Diagnosis present

## 2016-04-28 DIAGNOSIS — I959 Hypotension, unspecified: Secondary | ICD-10-CM | POA: Diagnosis present

## 2016-04-28 DIAGNOSIS — R079 Chest pain, unspecified: Secondary | ICD-10-CM | POA: Diagnosis not present

## 2016-04-28 DIAGNOSIS — Z7982 Long term (current) use of aspirin: Secondary | ICD-10-CM | POA: Diagnosis not present

## 2016-04-28 DIAGNOSIS — Z792 Long term (current) use of antibiotics: Secondary | ICD-10-CM | POA: Insufficient documentation

## 2016-04-28 DIAGNOSIS — E1165 Type 2 diabetes mellitus with hyperglycemia: Secondary | ICD-10-CM | POA: Diagnosis not present

## 2016-04-28 DIAGNOSIS — Z940 Kidney transplant status: Secondary | ICD-10-CM | POA: Insufficient documentation

## 2016-04-28 DIAGNOSIS — Z992 Dependence on renal dialysis: Secondary | ICD-10-CM | POA: Diagnosis not present

## 2016-04-28 DIAGNOSIS — R319 Hematuria, unspecified: Secondary | ICD-10-CM | POA: Diagnosis not present

## 2016-04-28 DIAGNOSIS — Z794 Long term (current) use of insulin: Secondary | ICD-10-CM | POA: Diagnosis not present

## 2016-04-28 DIAGNOSIS — Z4822 Encounter for aftercare following kidney transplant: Secondary | ICD-10-CM | POA: Diagnosis not present

## 2016-04-28 DIAGNOSIS — E875 Hyperkalemia: Secondary | ICD-10-CM | POA: Diagnosis not present

## 2016-04-28 DIAGNOSIS — Z960 Presence of urogenital implants: Secondary | ICD-10-CM | POA: Diagnosis not present

## 2016-04-28 DIAGNOSIS — Z79899 Other long term (current) drug therapy: Secondary | ICD-10-CM | POA: Diagnosis not present

## 2016-04-28 DIAGNOSIS — Z8673 Personal history of transient ischemic attack (TIA), and cerebral infarction without residual deficits: Secondary | ICD-10-CM | POA: Diagnosis not present

## 2016-04-28 DIAGNOSIS — R918 Other nonspecific abnormal finding of lung field: Secondary | ICD-10-CM | POA: Diagnosis not present

## 2016-04-28 DIAGNOSIS — J9811 Atelectasis: Secondary | ICD-10-CM | POA: Diagnosis not present

## 2016-04-28 DIAGNOSIS — E113599 Type 2 diabetes mellitus with proliferative diabetic retinopathy without macular edema, unspecified eye: Secondary | ICD-10-CM | POA: Diagnosis present

## 2016-04-28 DIAGNOSIS — E1122 Type 2 diabetes mellitus with diabetic chronic kidney disease: Secondary | ICD-10-CM | POA: Diagnosis not present

## 2016-04-28 DIAGNOSIS — I120 Hypertensive chronic kidney disease with stage 5 chronic kidney disease or end stage renal disease: Secondary | ICD-10-CM | POA: Diagnosis not present

## 2016-04-28 DIAGNOSIS — Z524 Kidney donor: Secondary | ICD-10-CM | POA: Diagnosis not present

## 2016-04-28 DIAGNOSIS — R262 Difficulty in walking, not elsewhere classified: Secondary | ICD-10-CM | POA: Diagnosis not present

## 2016-04-28 DIAGNOSIS — D899 Disorder involving the immune mechanism, unspecified: Secondary | ICD-10-CM | POA: Diagnosis not present

## 2016-04-28 DIAGNOSIS — I2510 Atherosclerotic heart disease of native coronary artery without angina pectoris: Secondary | ICD-10-CM | POA: Diagnosis present

## 2016-04-28 DIAGNOSIS — N2581 Secondary hyperparathyroidism of renal origin: Secondary | ICD-10-CM | POA: Diagnosis present

## 2016-04-28 DIAGNOSIS — B373 Candidiasis of vulva and vagina: Secondary | ICD-10-CM | POA: Diagnosis present

## 2016-04-28 DIAGNOSIS — E785 Hyperlipidemia, unspecified: Secondary | ICD-10-CM | POA: Diagnosis present

## 2016-04-28 DIAGNOSIS — Z915 Personal history of self-harm: Secondary | ICD-10-CM | POA: Diagnosis not present

## 2016-04-28 HISTORY — PX: KIDNEY TRANSPLANT: SHX239

## 2016-04-29 DIAGNOSIS — IMO0002 Reserved for concepts with insufficient information to code with codable children: Secondary | ICD-10-CM | POA: Insufficient documentation

## 2016-04-30 DIAGNOSIS — D899 Disorder involving the immune mechanism, unspecified: Secondary | ICD-10-CM

## 2016-04-30 DIAGNOSIS — T8619 Other complication of kidney transplant: Secondary | ICD-10-CM | POA: Insufficient documentation

## 2016-04-30 DIAGNOSIS — D849 Immunodeficiency, unspecified: Secondary | ICD-10-CM | POA: Insufficient documentation

## 2016-05-02 DIAGNOSIS — B373 Candidiasis of vulva and vagina: Secondary | ICD-10-CM | POA: Insufficient documentation

## 2016-05-02 DIAGNOSIS — B3731 Acute candidiasis of vulva and vagina: Secondary | ICD-10-CM | POA: Insufficient documentation

## 2016-05-05 DIAGNOSIS — N186 End stage renal disease: Secondary | ICD-10-CM | POA: Diagnosis not present

## 2016-05-05 DIAGNOSIS — R7989 Other specified abnormal findings of blood chemistry: Secondary | ICD-10-CM | POA: Diagnosis not present

## 2016-05-05 DIAGNOSIS — Z940 Kidney transplant status: Secondary | ICD-10-CM | POA: Diagnosis not present

## 2016-05-05 DIAGNOSIS — E1165 Type 2 diabetes mellitus with hyperglycemia: Secondary | ICD-10-CM | POA: Diagnosis not present

## 2016-05-05 DIAGNOSIS — E1122 Type 2 diabetes mellitus with diabetic chronic kidney disease: Secondary | ICD-10-CM | POA: Diagnosis not present

## 2016-05-05 DIAGNOSIS — E11319 Type 2 diabetes mellitus with unspecified diabetic retinopathy without macular edema: Secondary | ICD-10-CM | POA: Diagnosis not present

## 2016-05-05 DIAGNOSIS — I120 Hypertensive chronic kidney disease with stage 5 chronic kidney disease or end stage renal disease: Secondary | ICD-10-CM | POA: Diagnosis not present

## 2016-05-05 DIAGNOSIS — Z992 Dependence on renal dialysis: Secondary | ICD-10-CM | POA: Diagnosis not present

## 2016-05-08 DIAGNOSIS — Z79899 Other long term (current) drug therapy: Secondary | ICD-10-CM | POA: Diagnosis not present

## 2016-05-08 DIAGNOSIS — E1165 Type 2 diabetes mellitus with hyperglycemia: Secondary | ICD-10-CM | POA: Diagnosis not present

## 2016-05-08 DIAGNOSIS — Z960 Presence of urogenital implants: Secondary | ICD-10-CM | POA: Diagnosis not present

## 2016-05-08 DIAGNOSIS — D899 Disorder involving the immune mechanism, unspecified: Secondary | ICD-10-CM | POA: Diagnosis not present

## 2016-05-08 DIAGNOSIS — Z940 Kidney transplant status: Secondary | ICD-10-CM | POA: Diagnosis not present

## 2016-05-08 DIAGNOSIS — Z992 Dependence on renal dialysis: Secondary | ICD-10-CM | POA: Diagnosis not present

## 2016-05-08 DIAGNOSIS — Z794 Long term (current) use of insulin: Secondary | ICD-10-CM | POA: Diagnosis not present

## 2016-05-08 DIAGNOSIS — I120 Hypertensive chronic kidney disease with stage 5 chronic kidney disease or end stage renal disease: Secondary | ICD-10-CM | POA: Diagnosis not present

## 2016-05-08 DIAGNOSIS — N186 End stage renal disease: Secondary | ICD-10-CM | POA: Diagnosis not present

## 2016-05-08 DIAGNOSIS — E875 Hyperkalemia: Secondary | ICD-10-CM | POA: Diagnosis not present

## 2016-05-08 DIAGNOSIS — Z4822 Encounter for aftercare following kidney transplant: Secondary | ICD-10-CM | POA: Diagnosis not present

## 2016-05-08 DIAGNOSIS — T8619 Other complication of kidney transplant: Secondary | ICD-10-CM | POA: Diagnosis not present

## 2016-05-08 DIAGNOSIS — E1122 Type 2 diabetes mellitus with diabetic chronic kidney disease: Secondary | ICD-10-CM | POA: Diagnosis not present

## 2016-05-08 DIAGNOSIS — Z792 Long term (current) use of antibiotics: Secondary | ICD-10-CM | POA: Diagnosis not present

## 2016-05-08 DIAGNOSIS — Z713 Dietary counseling and surveillance: Secondary | ICD-10-CM | POA: Diagnosis not present

## 2016-05-14 ENCOUNTER — Ambulatory Visit (INDEPENDENT_AMBULATORY_CARE_PROVIDER_SITE_OTHER): Payer: Medicare Other | Admitting: Endocrinology

## 2016-05-14 ENCOUNTER — Encounter: Payer: Self-pay | Admitting: Endocrinology

## 2016-05-14 VITALS — BP 116/72 | Ht 62.0 in | Wt 195.0 lb

## 2016-05-14 DIAGNOSIS — Z794 Long term (current) use of insulin: Secondary | ICD-10-CM

## 2016-05-14 DIAGNOSIS — E1122 Type 2 diabetes mellitus with diabetic chronic kidney disease: Secondary | ICD-10-CM | POA: Diagnosis not present

## 2016-05-14 DIAGNOSIS — N186 End stage renal disease: Secondary | ICD-10-CM | POA: Diagnosis not present

## 2016-05-14 DIAGNOSIS — Z992 Dependence on renal dialysis: Secondary | ICD-10-CM | POA: Diagnosis not present

## 2016-05-14 LAB — POCT GLYCOSYLATED HEMOGLOBIN (HGB A1C): HEMOGLOBIN A1C: 7.9

## 2016-05-14 MED ORDER — INSULIN NPH ISOPHANE & REGULAR (70-30) 100 UNIT/ML ~~LOC~~ SUSP: 12.0000 [IU] | Freq: Two times a day (BID) | SUBCUTANEOUS | 11 refills | Status: DC

## 2016-05-14 NOTE — Patient Instructions (Addendum)
Please change the insulin to 12 units twice a day (with the first and last meals of the day).    On this type of insulin schedule, you should eat meals on a regular schedule.  If a meal is missed or significantly delayed, your blood sugar could go low.   check your blood sugar twice a day.  vary the time of day when you check, between before the 3 meals, and at bedtime.  also check if you have symptoms of your blood sugar being too high or too low.  please keep a record of the readings and bring it to your next appointment here (or you can bring the meter itself).  You can write it on any piece of paper.  please call us sooner if your blood sugar goes below 70, or if you have a lot of readings over 200.   Your need for insulin will change as the prednisone is decreased, and the new kidney works.  Therefore, Please call us later this week, to tell us how the blood sugar is doing.  Please come back for a follow-up appointment in 1 month.

## 2016-05-14 NOTE — Progress Notes (Signed)
Subjective:    Patient ID: Morgan [name], female    DOB: 11-Jan-1971, 46 y.o.   MRN: [id number]  HPI Pt returns for f/u of diabetes mellitus: DM type: Insulin-requiring type 2 Dx'ed: 1998.  Complications: polyneuropathy, proliferative retinopathy, and ESRD.  Therapy: insulin since dx.   GDM: never DKA: never Severe hypoglycemia: never.  Pancreatitis: never.   Other: She took an intentional insulin overdose in 2014, in a suicide attempt; therapy has been limited by noncompliance, so she is on a qd insulin schedule.  Interval history: She had a renal transplant 2 weeks ago.  She now takes 70/30, 20 units qam and 8 units qhs.  She also takes Reg prn (seldom takes).  Husband provides hx.  He brings a record of her cbg's which I have reviewed today.  It varies from 89-334.  she takes tapering prednisone (now 4 mg qd).   Past Medical History:  Diagnosis Date  . Anemia    low iron  . Constipation    when given iron  . Diabetes mellitus without complication ([place])    type 2  . Diabetic retinopathy ([place])   . H/O detached retina repair    bilateral  . Hypertension   . Pneumonia   . Renal disorder    dialysis on T/Th/Sa    Past Surgical History:  Procedure Laterality Date  . ARTERIOVENOUS GRAFT PLACEMENT Left 07/2010  . AV FISTULA PLACEMENT Right 11/25/2015   Procedure: INSERTION OF ARTERIOVENOUS (AV) GORE-TEX GRAFT RIGHT  ARM USING 4-7 MM X 45 CM STRETCH GRAFT.;  Surgeon: Angelia Mould, MD;  Location: [facility] OR;  Service: Vascular;  Laterality: Right;  . basilic vein transposition Left 2011  . CESAREAN SECTION    . CHOLECYSTECTOMY N/A 10/08/2014   Procedure: LAPAROSCOPIC CHOLECYSTECTOMY ;  Surgeon: Erroll Luna, MD;  Location: [place];  Service: General;  Laterality: N/A;  . EYE SURGERY     retinal detachment  . REVISION OF ARTERIOVENOUS GORETEX GRAFT Left 05/13/2015   Procedure: REVISION OF ARTERIOVENOUS GORETEX GRAFT;  Surgeon: Angelia Mould, MD;  Location: [place];  Service: Vascular;  Laterality: Left;    Social History   Social History  . Marital status: Married    Spouse name: N/A  . Number of children: N/A  . Years of education: N/A   Occupational History  . Not on file.   Social History Main Topics  . Smoking status: Never Smoker  . Smokeless tobacco: Never Used  . Alcohol use No  . Drug use: No  . Sexual activity: Not on file   Other Topics Concern  . Not on file   Social History Narrative  . No narrative on file    Current Outpatient Prescriptions on File Prior to Visit  Medication Sig Dispense Refill  . aspirin 81 MG tablet Take 81 mg by mouth daily.    Marland Kitchen glucose blood (ONE TOUCH ULTRA TEST) test strip 1 each by Other route 2 (two) times daily. And lancets 2/day 100 each 12  . oxyCODONE-acetaminophen (PERCOCET) 5-325 MG tablet Take 1 tablet by mouth every 6 (six) hours as needed for severe pain. 10 tablet 0  . B Complex-C-Folic Acid (RENA-VITE PO) Take 1 tablet by mouth daily.    . cinacalcet (SENSIPAR) 60 MG tablet Take 60 mg by mouth daily.    . clopidogrel (PLAVIX) 75 MG tablet Take 75 mg by mouth daily.    Marland Kitchen levonorgestrel (MIRENA) 20 MCG/24HR IUD 1 each by Intrauterine route  once.    . lidocaine-prilocaine (EMLA) cream Apply 1 application topically daily as needed (dialysis).     . pioglitazone (ACTOS) 15 MG tablet Take 1 tablet (15 mg total) by mouth daily. (Patient not taking: Reported on 05/14/2016) 30 tablet 5  . sevelamer carbonate (RENVELA) 800 MG tablet Take 4,000 mg by mouth 3 (three) times daily with meals.      No current facility-administered medications on file prior to visit.     Allergies  Allergen Reactions  . Fluorescein Shortness Of Breath and Other (See Comments)    Hypertension  . Iodinated Diagnostic Agents Anaphylaxis, Hives and Other (See Comments)    Diffuse swelling  . Iohexol Anaphylaxis, Hives, Itching, Swelling and Other (See Comments)    13 HR PRE-MEDS REQUIRED  . Latex Itching     Family History  Problem Relation Age of Onset  . Diabetes Maternal Grandmother     BP 116/72   Ht 5\' 2"  (1.575 m)   Wt 195 lb (88.5 kg)   BMI 35.67 kg/m   Review of Systems She denies hypoglycemia.     Objective:   Physical Exam VITAL SIGNS:  See vs page GENERAL: no distress.  Pulses: dorsalis pedis intact bilat.  MSK: no deformity of the feet.  CV: 1+ bilat leg edema.  Skin:  no ulcer on the feet.  normal color and temp on the feet.  Neuro: sensation is intact to touch on the feet, but decreased from normal.    a1c=7.9%    Assessment & Plan:  Insulin-requiring type 2 DM, with retinopathy: this is the best control this pt should aim for, given this regimen, which does match insulin to her changing needs throughout the day Renal transplant, new: this could push insulin need in either direction: improved renal function will increase insulin need, and decreasing prednisone would reduce it.   Patient is advised the following: Patient Instructions  Please change the insulin to 12 units twice a day (with the first and last meals of the day).    On this type of insulin schedule, you should eat meals on a regular schedule.  If a meal is missed or significantly delayed, your blood sugar could go low.   check your blood sugar twice a day.  vary the time of day when you check, between before the 3 meals, and at bedtime.  also check if you have symptoms of your blood sugar being too high or too low.  please keep a record of the readings and bring it to your next appointment here (or you can bring the meter itself).  You can write it on any piece of paper.  please call us sooner if your blood sugar goes below 70, or if you have a lot of readings over 200.   Your need for insulin will change as the prednisone is decreased, and the new kidney works.  Therefore, Please call us later this week, to tell us how the blood sugar is doing.  Please come back for a follow-up appointment in 1 month.

## 2016-05-15 DIAGNOSIS — Z79899 Other long term (current) drug therapy: Secondary | ICD-10-CM | POA: Diagnosis not present

## 2016-05-15 DIAGNOSIS — Z794 Long term (current) use of insulin: Secondary | ICD-10-CM | POA: Diagnosis not present

## 2016-05-15 DIAGNOSIS — Z940 Kidney transplant status: Secondary | ICD-10-CM | POA: Diagnosis not present

## 2016-05-15 DIAGNOSIS — N2581 Secondary hyperparathyroidism of renal origin: Secondary | ICD-10-CM | POA: Diagnosis not present

## 2016-05-15 DIAGNOSIS — N186 End stage renal disease: Secondary | ICD-10-CM | POA: Diagnosis not present

## 2016-05-15 DIAGNOSIS — D899 Disorder involving the immune mechanism, unspecified: Secondary | ICD-10-CM | POA: Diagnosis not present

## 2016-05-15 DIAGNOSIS — E1165 Type 2 diabetes mellitus with hyperglycemia: Secondary | ICD-10-CM | POA: Diagnosis not present

## 2016-05-15 DIAGNOSIS — Z992 Dependence on renal dialysis: Secondary | ICD-10-CM | POA: Diagnosis not present

## 2016-05-15 DIAGNOSIS — Z792 Long term (current) use of antibiotics: Secondary | ICD-10-CM | POA: Diagnosis not present

## 2016-05-15 DIAGNOSIS — I120 Hypertensive chronic kidney disease with stage 5 chronic kidney disease or end stage renal disease: Secondary | ICD-10-CM | POA: Diagnosis not present

## 2016-05-15 DIAGNOSIS — Z960 Presence of urogenital implants: Secondary | ICD-10-CM | POA: Diagnosis not present

## 2016-05-15 DIAGNOSIS — T8619 Other complication of kidney transplant: Secondary | ICD-10-CM | POA: Diagnosis not present

## 2016-05-15 DIAGNOSIS — E1122 Type 2 diabetes mellitus with diabetic chronic kidney disease: Secondary | ICD-10-CM | POA: Diagnosis not present

## 2016-05-18 ENCOUNTER — Ambulatory Visit: Payer: Self-pay | Admitting: Endocrinology

## 2016-05-22 DIAGNOSIS — Z792 Long term (current) use of antibiotics: Secondary | ICD-10-CM | POA: Diagnosis not present

## 2016-05-22 DIAGNOSIS — Z4822 Encounter for aftercare following kidney transplant: Secondary | ICD-10-CM | POA: Diagnosis not present

## 2016-05-22 DIAGNOSIS — Z960 Presence of urogenital implants: Secondary | ICD-10-CM | POA: Diagnosis not present

## 2016-05-22 DIAGNOSIS — E1122 Type 2 diabetes mellitus with diabetic chronic kidney disease: Secondary | ICD-10-CM | POA: Diagnosis not present

## 2016-05-22 DIAGNOSIS — E1165 Type 2 diabetes mellitus with hyperglycemia: Secondary | ICD-10-CM | POA: Diagnosis not present

## 2016-05-22 DIAGNOSIS — Z992 Dependence on renal dialysis: Secondary | ICD-10-CM | POA: Diagnosis not present

## 2016-05-22 DIAGNOSIS — D899 Disorder involving the immune mechanism, unspecified: Secondary | ICD-10-CM | POA: Diagnosis not present

## 2016-05-22 DIAGNOSIS — Z794 Long term (current) use of insulin: Secondary | ICD-10-CM | POA: Diagnosis not present

## 2016-05-22 DIAGNOSIS — N186 End stage renal disease: Secondary | ICD-10-CM | POA: Diagnosis not present

## 2016-05-29 DIAGNOSIS — D899 Disorder involving the immune mechanism, unspecified: Secondary | ICD-10-CM | POA: Diagnosis not present

## 2016-05-29 DIAGNOSIS — I10 Essential (primary) hypertension: Secondary | ICD-10-CM | POA: Insufficient documentation

## 2016-05-29 DIAGNOSIS — Z794 Long term (current) use of insulin: Secondary | ICD-10-CM | POA: Diagnosis not present

## 2016-05-29 DIAGNOSIS — Z992 Dependence on renal dialysis: Secondary | ICD-10-CM | POA: Diagnosis not present

## 2016-05-29 DIAGNOSIS — E1165 Type 2 diabetes mellitus with hyperglycemia: Secondary | ICD-10-CM | POA: Diagnosis not present

## 2016-05-29 DIAGNOSIS — R1011 Right upper quadrant pain: Secondary | ICD-10-CM | POA: Insufficient documentation

## 2016-05-29 DIAGNOSIS — Z940 Kidney transplant status: Secondary | ICD-10-CM | POA: Diagnosis not present

## 2016-05-29 DIAGNOSIS — E1122 Type 2 diabetes mellitus with diabetic chronic kidney disease: Secondary | ICD-10-CM | POA: Diagnosis not present

## 2016-05-29 DIAGNOSIS — N186 End stage renal disease: Secondary | ICD-10-CM | POA: Diagnosis not present

## 2016-05-29 DIAGNOSIS — Z792 Long term (current) use of antibiotics: Secondary | ICD-10-CM | POA: Diagnosis not present

## 2016-06-01 DIAGNOSIS — N186 End stage renal disease: Secondary | ICD-10-CM | POA: Diagnosis not present

## 2016-06-01 DIAGNOSIS — Z794 Long term (current) use of insulin: Secondary | ICD-10-CM | POA: Diagnosis not present

## 2016-06-01 DIAGNOSIS — E1122 Type 2 diabetes mellitus with diabetic chronic kidney disease: Secondary | ICD-10-CM | POA: Diagnosis not present

## 2016-06-01 DIAGNOSIS — Z992 Dependence on renal dialysis: Secondary | ICD-10-CM | POA: Diagnosis not present

## 2016-06-01 DIAGNOSIS — Z940 Kidney transplant status: Secondary | ICD-10-CM | POA: Diagnosis not present

## 2016-06-01 DIAGNOSIS — E1165 Type 2 diabetes mellitus with hyperglycemia: Secondary | ICD-10-CM | POA: Diagnosis not present

## 2016-06-01 DIAGNOSIS — N99842 Postprocedural seroma of a genitourinary system organ or structure following a genitourinary system procedure: Secondary | ICD-10-CM | POA: Diagnosis not present

## 2016-06-06 DIAGNOSIS — Z940 Kidney transplant status: Secondary | ICD-10-CM | POA: Diagnosis not present

## 2016-06-06 DIAGNOSIS — Z5181 Encounter for therapeutic drug level monitoring: Secondary | ICD-10-CM | POA: Diagnosis not present

## 2016-06-14 ENCOUNTER — Ambulatory Visit: Payer: Self-pay | Admitting: Endocrinology

## 2016-06-14 DIAGNOSIS — E213 Hyperparathyroidism, unspecified: Secondary | ICD-10-CM | POA: Diagnosis not present

## 2016-06-14 DIAGNOSIS — N760 Acute vaginitis: Secondary | ICD-10-CM | POA: Diagnosis not present

## 2016-06-14 DIAGNOSIS — Z48298 Encounter for aftercare following other organ transplant: Secondary | ICD-10-CM | POA: Diagnosis not present

## 2016-06-14 DIAGNOSIS — Z940 Kidney transplant status: Secondary | ICD-10-CM | POA: Diagnosis not present

## 2016-06-14 DIAGNOSIS — Z79899 Other long term (current) drug therapy: Secondary | ICD-10-CM | POA: Diagnosis not present

## 2016-06-19 DIAGNOSIS — Z940 Kidney transplant status: Secondary | ICD-10-CM | POA: Diagnosis not present

## 2016-06-19 DIAGNOSIS — N390 Urinary tract infection, site not specified: Secondary | ICD-10-CM | POA: Diagnosis not present

## 2016-06-19 DIAGNOSIS — Z5181 Encounter for therapeutic drug level monitoring: Secondary | ICD-10-CM | POA: Diagnosis not present

## 2016-06-19 DIAGNOSIS — F4310 Post-traumatic stress disorder, unspecified: Secondary | ICD-10-CM | POA: Diagnosis not present

## 2016-06-19 DIAGNOSIS — B3749 Other urogenital candidiasis: Secondary | ICD-10-CM | POA: Diagnosis not present

## 2016-06-25 DIAGNOSIS — E213 Hyperparathyroidism, unspecified: Secondary | ICD-10-CM | POA: Diagnosis not present

## 2016-06-25 DIAGNOSIS — Z466 Encounter for fitting and adjustment of urinary device: Secondary | ICD-10-CM | POA: Diagnosis not present

## 2016-06-25 DIAGNOSIS — Z79899 Other long term (current) drug therapy: Secondary | ICD-10-CM | POA: Diagnosis not present

## 2016-06-25 DIAGNOSIS — I120 Hypertensive chronic kidney disease with stage 5 chronic kidney disease or end stage renal disease: Secondary | ICD-10-CM | POA: Diagnosis not present

## 2016-06-25 DIAGNOSIS — E1122 Type 2 diabetes mellitus with diabetic chronic kidney disease: Secondary | ICD-10-CM | POA: Diagnosis not present

## 2016-06-25 DIAGNOSIS — E1142 Type 2 diabetes mellitus with diabetic polyneuropathy: Secondary | ICD-10-CM | POA: Diagnosis not present

## 2016-06-25 DIAGNOSIS — Z992 Dependence on renal dialysis: Secondary | ICD-10-CM | POA: Diagnosis not present

## 2016-06-25 DIAGNOSIS — Z975 Presence of (intrauterine) contraceptive device: Secondary | ICD-10-CM | POA: Diagnosis not present

## 2016-06-25 DIAGNOSIS — Z7982 Long term (current) use of aspirin: Secondary | ICD-10-CM | POA: Diagnosis not present

## 2016-06-25 DIAGNOSIS — Z87442 Personal history of urinary calculi: Secondary | ICD-10-CM | POA: Diagnosis not present

## 2016-06-25 DIAGNOSIS — Z7952 Long term (current) use of systemic steroids: Secondary | ICD-10-CM | POA: Diagnosis not present

## 2016-06-25 DIAGNOSIS — K219 Gastro-esophageal reflux disease without esophagitis: Secondary | ICD-10-CM | POA: Diagnosis not present

## 2016-06-25 DIAGNOSIS — Z794 Long term (current) use of insulin: Secondary | ICD-10-CM | POA: Diagnosis not present

## 2016-06-25 DIAGNOSIS — E11319 Type 2 diabetes mellitus with unspecified diabetic retinopathy without macular edema: Secondary | ICD-10-CM | POA: Diagnosis not present

## 2016-06-25 DIAGNOSIS — Z91048 Other nonmedicinal substance allergy status: Secondary | ICD-10-CM | POA: Diagnosis not present

## 2016-06-25 DIAGNOSIS — Z91040 Latex allergy status: Secondary | ICD-10-CM | POA: Diagnosis not present

## 2016-06-25 DIAGNOSIS — Z91041 Radiographic dye allergy status: Secondary | ICD-10-CM | POA: Diagnosis not present

## 2016-06-25 DIAGNOSIS — N186 End stage renal disease: Secondary | ICD-10-CM | POA: Diagnosis not present

## 2016-06-25 DIAGNOSIS — E785 Hyperlipidemia, unspecified: Secondary | ICD-10-CM | POA: Diagnosis not present

## 2016-06-25 DIAGNOSIS — Z940 Kidney transplant status: Secondary | ICD-10-CM | POA: Diagnosis not present

## 2016-06-25 DIAGNOSIS — Z8619 Personal history of other infectious and parasitic diseases: Secondary | ICD-10-CM | POA: Diagnosis not present

## 2016-06-25 DIAGNOSIS — Z4682 Encounter for fitting and adjustment of non-vascular catheter: Secondary | ICD-10-CM | POA: Diagnosis not present

## 2016-06-27 DIAGNOSIS — F4310 Post-traumatic stress disorder, unspecified: Secondary | ICD-10-CM | POA: Diagnosis not present

## 2016-06-27 DIAGNOSIS — Z5181 Encounter for therapeutic drug level monitoring: Secondary | ICD-10-CM | POA: Diagnosis not present

## 2016-06-27 DIAGNOSIS — Z940 Kidney transplant status: Secondary | ICD-10-CM | POA: Diagnosis not present

## 2016-07-02 DIAGNOSIS — Z5181 Encounter for therapeutic drug level monitoring: Secondary | ICD-10-CM | POA: Diagnosis not present

## 2016-07-02 DIAGNOSIS — Z940 Kidney transplant status: Secondary | ICD-10-CM | POA: Diagnosis not present

## 2016-07-04 DIAGNOSIS — F4310 Post-traumatic stress disorder, unspecified: Secondary | ICD-10-CM | POA: Diagnosis not present

## 2016-07-10 DIAGNOSIS — Z5181 Encounter for therapeutic drug level monitoring: Secondary | ICD-10-CM | POA: Diagnosis not present

## 2016-07-10 DIAGNOSIS — Z940 Kidney transplant status: Secondary | ICD-10-CM | POA: Diagnosis not present

## 2016-07-16 DIAGNOSIS — Z5181 Encounter for therapeutic drug level monitoring: Secondary | ICD-10-CM | POA: Diagnosis not present

## 2016-07-16 DIAGNOSIS — Z940 Kidney transplant status: Secondary | ICD-10-CM | POA: Diagnosis not present

## 2016-07-25 DIAGNOSIS — Z940 Kidney transplant status: Secondary | ICD-10-CM | POA: Diagnosis not present

## 2016-07-25 DIAGNOSIS — Z5181 Encounter for therapeutic drug level monitoring: Secondary | ICD-10-CM | POA: Diagnosis not present

## 2016-07-26 DIAGNOSIS — H25813 Combined forms of age-related cataract, bilateral: Secondary | ICD-10-CM | POA: Diagnosis not present

## 2016-07-26 DIAGNOSIS — H35341 Macular cyst, hole, or pseudohole, right eye: Secondary | ICD-10-CM | POA: Diagnosis not present

## 2016-07-26 DIAGNOSIS — E113599 Type 2 diabetes mellitus with proliferative diabetic retinopathy without macular edema, unspecified eye: Secondary | ICD-10-CM | POA: Diagnosis not present

## 2016-07-31 DIAGNOSIS — K219 Gastro-esophageal reflux disease without esophagitis: Secondary | ICD-10-CM | POA: Insufficient documentation

## 2016-07-31 DIAGNOSIS — Z940 Kidney transplant status: Secondary | ICD-10-CM | POA: Diagnosis not present

## 2016-08-08 DIAGNOSIS — Z940 Kidney transplant status: Secondary | ICD-10-CM | POA: Diagnosis not present

## 2016-08-08 DIAGNOSIS — Z5181 Encounter for therapeutic drug level monitoring: Secondary | ICD-10-CM | POA: Diagnosis not present

## 2016-08-13 DIAGNOSIS — Z940 Kidney transplant status: Secondary | ICD-10-CM | POA: Diagnosis not present

## 2016-08-13 DIAGNOSIS — Z5181 Encounter for therapeutic drug level monitoring: Secondary | ICD-10-CM | POA: Diagnosis not present

## 2016-08-16 ENCOUNTER — Other Ambulatory Visit: Payer: Self-pay

## 2016-08-16 MED ORDER — "INSULIN SYRINGE 31G X 5/16"" 1 ML MISC": 2 refills | Status: DC

## 2016-08-17 DIAGNOSIS — Z992 Dependence on renal dialysis: Secondary | ICD-10-CM | POA: Diagnosis not present

## 2016-08-17 DIAGNOSIS — T8619 Other complication of kidney transplant: Secondary | ICD-10-CM | POA: Diagnosis not present

## 2016-08-17 DIAGNOSIS — Z794 Long term (current) use of insulin: Secondary | ICD-10-CM | POA: Diagnosis not present

## 2016-08-17 DIAGNOSIS — E118 Type 2 diabetes mellitus with unspecified complications: Secondary | ICD-10-CM | POA: Diagnosis not present

## 2016-08-17 DIAGNOSIS — Z4822 Encounter for aftercare following kidney transplant: Secondary | ICD-10-CM | POA: Diagnosis not present

## 2016-08-17 DIAGNOSIS — N186 End stage renal disease: Secondary | ICD-10-CM | POA: Diagnosis not present

## 2016-08-17 DIAGNOSIS — Z940 Kidney transplant status: Secondary | ICD-10-CM | POA: Diagnosis not present

## 2016-08-17 DIAGNOSIS — D899 Disorder involving the immune mechanism, unspecified: Secondary | ICD-10-CM | POA: Diagnosis not present

## 2016-08-17 DIAGNOSIS — E1165 Type 2 diabetes mellitus with hyperglycemia: Secondary | ICD-10-CM | POA: Diagnosis not present

## 2016-08-17 DIAGNOSIS — N2581 Secondary hyperparathyroidism of renal origin: Secondary | ICD-10-CM | POA: Diagnosis not present

## 2016-08-17 DIAGNOSIS — I10 Essential (primary) hypertension: Secondary | ICD-10-CM | POA: Diagnosis not present

## 2016-08-17 DIAGNOSIS — E1122 Type 2 diabetes mellitus with diabetic chronic kidney disease: Secondary | ICD-10-CM | POA: Diagnosis not present

## 2016-08-21 ENCOUNTER — Telehealth: Payer: Self-pay | Admitting: Endocrinology

## 2016-08-21 ENCOUNTER — Other Ambulatory Visit: Payer: Self-pay

## 2016-08-21 MED ORDER — "INSULIN SYRINGE 31G X 5/16"" 1 ML MISC": 2 refills | Status: DC

## 2016-08-21 NOTE — Telephone Encounter (Signed)
[facility] Insulin Syringe-Needle U-100 (INSULIN SYRINGE 1CC/31GX5/16") 31G X 5/16" 1 ML MISC [[id number]]   script written to be complaint with medicare part b . Sig line contains dx

## 2016-08-21 NOTE — Telephone Encounter (Signed)
Submitted to pharmacy 

## 2016-08-21 NOTE — Telephone Encounter (Signed)
[facility]ulin Sy[facility]  Needs to be new script and patient is already at pharmacy.  [id number] [place]  Thank you,  -LL

## 2016-08-23 DIAGNOSIS — Z940 Kidney transplant status: Secondary | ICD-10-CM | POA: Diagnosis not present

## 2016-08-23 DIAGNOSIS — Z5181 Encounter for therapeutic drug level monitoring: Secondary | ICD-10-CM | POA: Diagnosis not present

## 2016-08-27 DIAGNOSIS — A630 Anogenital (venereal) warts: Secondary | ICD-10-CM | POA: Diagnosis not present

## 2016-08-27 DIAGNOSIS — Z30431 Encounter for routine checking of intrauterine contraceptive device: Secondary | ICD-10-CM | POA: Diagnosis not present

## 2016-08-27 DIAGNOSIS — R1031 Right lower quadrant pain: Secondary | ICD-10-CM | POA: Diagnosis not present

## 2016-08-27 DIAGNOSIS — R109 Unspecified abdominal pain: Secondary | ICD-10-CM | POA: Diagnosis not present

## 2016-08-28 DIAGNOSIS — N186 End stage renal disease: Secondary | ICD-10-CM | POA: Diagnosis not present

## 2016-08-28 DIAGNOSIS — I10 Essential (primary) hypertension: Secondary | ICD-10-CM | POA: Diagnosis not present

## 2016-08-28 DIAGNOSIS — I739 Peripheral vascular disease, unspecified: Secondary | ICD-10-CM | POA: Diagnosis not present

## 2016-08-28 DIAGNOSIS — Z6832 Body mass index (BMI) 32.0-32.9, adult: Secondary | ICD-10-CM | POA: Diagnosis not present

## 2016-08-28 DIAGNOSIS — Z940 Kidney transplant status: Secondary | ICD-10-CM | POA: Diagnosis not present

## 2016-08-30 DIAGNOSIS — Z5181 Encounter for therapeutic drug level monitoring: Secondary | ICD-10-CM | POA: Diagnosis not present

## 2016-08-30 DIAGNOSIS — Z940 Kidney transplant status: Secondary | ICD-10-CM | POA: Diagnosis not present

## 2016-09-10 DIAGNOSIS — Z940 Kidney transplant status: Secondary | ICD-10-CM | POA: Diagnosis not present

## 2016-09-10 DIAGNOSIS — Z5181 Encounter for therapeutic drug level monitoring: Secondary | ICD-10-CM | POA: Diagnosis not present

## 2016-09-20 DIAGNOSIS — Z940 Kidney transplant status: Secondary | ICD-10-CM | POA: Diagnosis not present

## 2016-09-20 DIAGNOSIS — Z5181 Encounter for therapeutic drug level monitoring: Secondary | ICD-10-CM | POA: Diagnosis not present

## 2016-09-21 DIAGNOSIS — A630 Anogenital (venereal) warts: Secondary | ICD-10-CM | POA: Diagnosis not present

## 2016-09-21 DIAGNOSIS — Z3169 Encounter for other general counseling and advice on procreation: Secondary | ICD-10-CM | POA: Diagnosis not present

## 2016-09-21 DIAGNOSIS — Z124 Encounter for screening for malignant neoplasm of cervix: Secondary | ICD-10-CM | POA: Diagnosis not present

## 2016-09-25 DIAGNOSIS — Z1231 Encounter for screening mammogram for malignant neoplasm of breast: Secondary | ICD-10-CM | POA: Diagnosis not present

## 2016-09-26 DIAGNOSIS — T8332XA Displacement of intrauterine contraceptive device, initial encounter: Secondary | ICD-10-CM | POA: Diagnosis not present

## 2016-10-02 DIAGNOSIS — Z4822 Encounter for aftercare following kidney transplant: Secondary | ICD-10-CM | POA: Diagnosis not present

## 2016-10-02 DIAGNOSIS — Z992 Dependence on renal dialysis: Secondary | ICD-10-CM | POA: Diagnosis not present

## 2016-10-02 DIAGNOSIS — E213 Hyperparathyroidism, unspecified: Secondary | ICD-10-CM | POA: Diagnosis not present

## 2016-10-02 DIAGNOSIS — N186 End stage renal disease: Secondary | ICD-10-CM | POA: Diagnosis not present

## 2016-10-02 DIAGNOSIS — I120 Hypertensive chronic kidney disease with stage 5 chronic kidney disease or end stage renal disease: Secondary | ICD-10-CM | POA: Diagnosis not present

## 2016-10-02 DIAGNOSIS — Z792 Long term (current) use of antibiotics: Secondary | ICD-10-CM | POA: Diagnosis not present

## 2016-10-02 DIAGNOSIS — N2581 Secondary hyperparathyroidism of renal origin: Secondary | ICD-10-CM | POA: Diagnosis not present

## 2016-10-02 DIAGNOSIS — E1165 Type 2 diabetes mellitus with hyperglycemia: Secondary | ICD-10-CM | POA: Diagnosis not present

## 2016-10-02 DIAGNOSIS — G4440 Drug-induced headache, not elsewhere classified, not intractable: Secondary | ICD-10-CM | POA: Diagnosis not present

## 2016-10-02 DIAGNOSIS — D702 Other drug-induced agranulocytosis: Secondary | ICD-10-CM | POA: Diagnosis not present

## 2016-10-02 DIAGNOSIS — Z794 Long term (current) use of insulin: Secondary | ICD-10-CM | POA: Diagnosis not present

## 2016-10-02 DIAGNOSIS — Z79899 Other long term (current) drug therapy: Secondary | ICD-10-CM | POA: Diagnosis not present

## 2016-10-02 DIAGNOSIS — Z940 Kidney transplant status: Secondary | ICD-10-CM | POA: Diagnosis not present

## 2016-10-02 DIAGNOSIS — D899 Disorder involving the immune mechanism, unspecified: Secondary | ICD-10-CM | POA: Diagnosis not present

## 2016-10-02 DIAGNOSIS — R51 Headache: Secondary | ICD-10-CM | POA: Diagnosis not present

## 2016-10-02 DIAGNOSIS — E1122 Type 2 diabetes mellitus with diabetic chronic kidney disease: Secondary | ICD-10-CM | POA: Diagnosis not present

## 2016-10-08 DIAGNOSIS — Z940 Kidney transplant status: Secondary | ICD-10-CM | POA: Diagnosis not present

## 2016-10-08 DIAGNOSIS — Z5181 Encounter for therapeutic drug level monitoring: Secondary | ICD-10-CM | POA: Diagnosis not present

## 2016-10-22 DIAGNOSIS — H2513 Age-related nuclear cataract, bilateral: Secondary | ICD-10-CM | POA: Diagnosis not present

## 2016-11-05 DIAGNOSIS — Z5181 Encounter for therapeutic drug level monitoring: Secondary | ICD-10-CM | POA: Diagnosis not present

## 2016-11-05 DIAGNOSIS — Z940 Kidney transplant status: Secondary | ICD-10-CM | POA: Diagnosis not present

## 2016-11-09 ENCOUNTER — Ambulatory Visit: Payer: Self-pay | Admitting: Endocrinology

## 2016-11-14 ENCOUNTER — Ambulatory Visit: Payer: Self-pay | Admitting: Endocrinology

## 2016-11-14 DIAGNOSIS — Z0289 Encounter for other administrative examinations: Secondary | ICD-10-CM

## 2016-11-28 DIAGNOSIS — R55 Syncope and collapse: Secondary | ICD-10-CM | POA: Insufficient documentation

## 2016-11-28 DIAGNOSIS — N183 Chronic kidney disease, stage 3 unspecified: Secondary | ICD-10-CM | POA: Insufficient documentation

## 2016-11-28 DIAGNOSIS — N184 Chronic kidney disease, stage 4 (severe): Secondary | ICD-10-CM | POA: Insufficient documentation

## 2016-11-28 DIAGNOSIS — N189 Chronic kidney disease, unspecified: Secondary | ICD-10-CM | POA: Insufficient documentation

## 2016-11-29 ENCOUNTER — Encounter: Payer: Self-pay | Admitting: Endocrinology

## 2016-11-29 ENCOUNTER — Ambulatory Visit (INDEPENDENT_AMBULATORY_CARE_PROVIDER_SITE_OTHER): Payer: Medicare Other | Admitting: Endocrinology

## 2016-11-29 VITALS — BP 116/78 | HR 99 | Wt 188.2 lb

## 2016-11-29 DIAGNOSIS — E1122 Type 2 diabetes mellitus with diabetic chronic kidney disease: Secondary | ICD-10-CM | POA: Diagnosis not present

## 2016-11-29 DIAGNOSIS — Z992 Dependence on renal dialysis: Secondary | ICD-10-CM

## 2016-11-29 DIAGNOSIS — Z794 Long term (current) use of insulin: Secondary | ICD-10-CM

## 2016-11-29 DIAGNOSIS — N186 End stage renal disease: Secondary | ICD-10-CM | POA: Diagnosis not present

## 2016-11-29 LAB — POCT GLYCOSYLATED HEMOGLOBIN (HGB A1C): Hemoglobin A1C: 8.9

## 2016-11-29 MED ORDER — FREESTYLE LIBRE 14 DAY READER DEVI: 1.0000 | Freq: Once | 0 refills | Status: AC

## 2016-11-29 MED ORDER — INSULIN NPH ISOPHANE & REGULAR (70-30) 100 UNIT/ML ~~LOC~~ SUSP: SUBCUTANEOUS | 11 refills | Status: DC

## 2016-11-29 MED ORDER — FREESTYLE LIBRE 14 DAY SENSOR MISC: 1.0000 | 3 refills | Status: DC

## 2016-11-29 NOTE — Progress Notes (Signed)
Subjective:    Patient ID: Morgan [name], female    DOB: 11-23-1970, 46 y.o.   MRN: [id number]  HPI Pt returns for f/u of diabetes mellitus: DM type: Insulin-requiring type 2 Dx'ed: 1998.  Complications: polyneuropathy, proliferative retinopathy, and ESRD.  Therapy: insulin since dx.   GDM: never DKA: never Severe hypoglycemia: never.  Pancreatitis: never.   Other: She took an intentional insulin overdose in 2014, in a suicide attempt; therapy has been limited by noncompliance, so she is on a qd insulin schedule; she had a renal transplant 2 weeks ago. Interval history: She takes 70/30, 16 units qam and 12 units qpm.  She also takes reg PRN (seldom takes this).  no cbg record, but states cbg's vary from 81-400.  It is in general higher as the day goes on.   Past Medical History:  Diagnosis Date  . Anemia    low iron  . Constipation    when given iron  . Diabetes mellitus without complication ([place])    type 2  . Diabetic retinopathy ([place])   . H/O detached retina repair    bilateral  . Hypertension   . Pneumonia   . Renal disorder    dialysis on T/Th/Sa    Past Surgical History:  Procedure Laterality Date  . ARTERIOVENOUS GRAFT PLACEMENT Left 07/2010  . AV FISTULA PLACEMENT Right 11/25/2015   Procedure: INSERTION OF ARTERIOVENOUS (AV) GORE-TEX GRAFT RIGHT  ARM USING 4-7 MM X 45 CM STRETCH GRAFT.;  Surgeon: Angelia Mould, MD;  Location: [facility] OR;  Service: Vascular;  Laterality: Right;  . basilic vein transposition Left 2011  . CESAREAN SECTION    . CHOLECYSTECTOMY N/A 10/08/2014   Procedure: LAPAROSCOPIC CHOLECYSTECTOMY ;  Surgeon: Erroll Luna, MD;  Location: [place];  Service: General;  Laterality: N/A;  . EYE SURGERY     retinal detachment  . REVISION OF ARTERIOVENOUS GORETEX GRAFT Left 05/13/2015   Procedure: REVISION OF ARTERIOVENOUS GORETEX GRAFT;  Surgeon: Angelia Mould, MD;  Location: [place];  Service: Vascular;  Laterality: Left;    Social  History   Social History  . Marital status: Married    Spouse name: N/A  . Number of children: N/A  . Years of education: N/A   Occupational History  . Not on file.   Social History Main Topics  . Smoking status: Never Smoker  . Smokeless tobacco: Never Used  . Alcohol use No  . Drug use: No  . Sexual activity: Not on file   Other Topics Concern  . Not on file   Social History Narrative  . No narrative on file    Current Outpatient Prescriptions on File Prior to Visit  Medication Sig Dispense Refill  . aspirin 81 MG tablet Take 81 mg by mouth daily.    . B Complex-C-Folic Acid (RENA-VITE PO) Take 1 tablet by mouth daily.    Marland Kitchen glucose blood (ONE TOUCH ULTRA TEST) test strip 1 each by Other route 2 (two) times daily. And lancets 2/day 100 each 12  . Insulin Syringe-Needle U-100 (INSULIN SYRINGE 1CC/31GX5/16") 31G X 5/16" 1 ML MISC Use to inject insulin 3 times per day.  Dx- Z79.4, E11.22 200 each 2  . levonorgestrel (MIRENA) 20 MCG/24HR IUD 1 each by Intrauterine route once.    . mycophenolate (CELLCEPT) 250 MG capsule Take 1,000 mg by mouth 2 (two) times daily.    . ranitidine (ZANTAC) 150 MG tablet Take 150 mg by mouth 2 (two) times daily.    Marland Kitchen  Sulfamethoxazole-Trimethoprim (BACTRIM PO) Take by mouth.    . tacrolimus (PROGRAF) 1 MG capsule Take 9 mg by mouth 2 (two) times daily. 5 in the morning and 4 at night.    . valGANciclovir (VALCYTE) 450 MG tablet Take by mouth daily. 1 tab every other day.     . cinacalcet (SENSIPAR) 60 MG tablet Take 60 mg by mouth daily.    . clopidogrel (PLAVIX) 75 MG tablet Take 75 mg by mouth daily.    . clotrimazole (LOTRIMIN) 1 % cream Apply 1 application topically 2 (two) times daily.    Marland Kitchen lidocaine-prilocaine (EMLA) cream Apply 1 application topically daily as needed (dialysis).     Marland Kitchen oxyCODONE-acetaminophen (PERCOCET) 5-325 MG tablet Take 1 tablet by mouth every 6 (six) hours as needed for severe pain. (Patient not taking: Reported on  11/29/2016) 10 tablet 0  . pioglitazone (ACTOS) 15 MG tablet Take 1 tablet (15 mg total) by mouth daily. (Patient not taking: Reported on 11/29/2016) 30 tablet 5  . polyethylene glycol (MIRALAX / GLYCOLAX) packet Take 17 g by mouth daily.    . sevelamer carbonate (RENVELA) 800 MG tablet Take 4,000 mg by mouth 3 (three) times daily with meals.      No current facility-administered medications on file prior to visit.     Allergies  Allergen Reactions  . Fluorescein Shortness Of Breath and Other (See Comments)    Hypertension  . Iodinated Diagnostic Agents Anaphylaxis, Hives and Other (See Comments)    Diffuse swelling  . Iohexol Anaphylaxis, Hives, Itching, Swelling and Other (See Comments)    13 HR PRE-MEDS REQUIRED  . Latex Itching    Family History  Problem Relation Age of Onset  . Diabetes Maternal Grandmother     BP 116/78   Pulse 99   Wt 188 lb 3.2 oz (85.4 kg)   SpO2 98%   BMI 34.42 kg/m   Review of Systems She denies hypoglycemia.     Objective:   Physical Exam VITAL SIGNS:  See vs page GENERAL: no distress.  Pulses: foot pulses are intact bilaterally.   MSK: no deformity of the feet or ankles.  CV: trace bilat edema of the legs Skin:  no ulcer on the feet or ankles.  normal color and temp on the feet and ankles Neuro: sensation is intact to touch on the feet and ankles, but decreased from normal   Lab Results  Component Value Date   HGBA1C 8.9 11/29/2016      Assessment & Plan:  Insulin-requiring type 2 DM: based on the pattern of her cbg's, she needs some adjustment in her therapy.    Patient Instructions  check your blood sugar twice a day.  vary the time of day when you check, between before the 3 meals, and at bedtime.  also check if you have symptoms of your blood sugar being too high or too low.  please keep a record of the readings and bring it to your next appointment here (or you can bring the meter itself).  You can write it on any piece of paper.   please call us sooner if your blood sugar goes below 70, or if you have a lot of readings over 200.   Please change the 70/30 insulin to 70/30, 32 units with breakfast, and 4 units with supper.   On this type of insulin schedule, you should eat meals on a regular schedule.  If a meal is missed or significantly delayed, your blood sugar could  go low.  I have sent a prescription to your pharmacy, for the freestyle [place] device.  Please call or message [place] next week, to tell us how the blood sugar is doing.

## 2016-11-29 NOTE — Patient Instructions (Addendum)
check your blood sugar twice a day.  vary the time of day when you check, between before the 3 meals, and at bedtime.  also check if you have symptoms of your blood sugar being too high or too low.  please keep a record of the readings and bring it to your next appointment here (or you can bring the meter itself).  You can write it on any piece of paper.  please call us sooner if your blood sugar goes below 70, or if you have a lot of readings over 200.   Please change the 70/30 insulin to 70/30, 32 units with breakfast, and 4 units with supper.   On this type of insulin schedule, you should eat meals on a regular schedule.  If a meal is missed or significantly delayed, your blood sugar could go low.  I have sent a prescription to your pharmacy, for the freestyle [place] device.  Please call or message [place] next week, to tell us how the blood sugar is doing.

## 2016-11-30 ENCOUNTER — Telehealth: Payer: Self-pay | Admitting: *Deleted

## 2016-11-30 NOTE — Telephone Encounter (Signed)
Spoke with the patient and let her know of the 32 u of insulin in AM

## 2016-11-30 NOTE — Telephone Encounter (Signed)
Patient called and states she seen Dr. Loanne Drilling and wants to know what units of insulin to take. She has not taken her am insulin as of yet .  Patient states she was changed to the 70/30 and she takes the  regular insulin. . She needs to know what exact unit to take. Patient is requesting a call back.. Her call back number is [phone]. Please advise. Thank you

## 2017-01-08 ENCOUNTER — Telehealth: Payer: Self-pay | Admitting: *Deleted

## 2017-01-08 NOTE — Telephone Encounter (Signed)
Patient states the medicine she was talking to you about was the [place] . Patient is wanting to make sure this medication is ok to try . She wants to confirm with Dr. Loanne Drilling . Please advise. Thank you

## 2017-01-08 NOTE — Telephone Encounter (Signed)
This is medication patient couldn't remember earlier. Please advise?

## 2017-01-08 NOTE — Telephone Encounter (Signed)
Talked to patient & she wanted to know if there was an insulin she could take once a week for tighter blood sugar control? She also is bringing by [facility] by for me to download so she can be advised on insulin dosages. She said she is still running in the 200-300's.

## 2017-01-08 NOTE — Telephone Encounter (Signed)
Patient called and states he insurance [employer] will not cover her insulin this month. Her medication is Novolin. Patient states is there another insulin she can take that her insurance will cover. She only have 3-4 days of insulin left. She is requesting a call back. Her contact is [phone]. Please advise. Thank you

## 2017-01-08 NOTE — Telephone Encounter (Signed)
Any insulin can achieve tight control if you take enough.  Please let me know which insulin insurance prefers.

## 2017-01-09 NOTE — Telephone Encounter (Signed)
Please move up next appt to next available, and we'll address.

## 2017-01-09 NOTE — Telephone Encounter (Signed)
I left patient VM to call to make first available appt to address questions & concerns about insulin.

## 2017-01-09 NOTE — Telephone Encounter (Signed)
Called & left patient VM to call back to make first available appt. So that insulin issues can be addressed.

## 2017-01-10 ENCOUNTER — Encounter: Payer: Self-pay | Admitting: Endocrinology

## 2017-01-10 ENCOUNTER — Ambulatory Visit: Payer: Self-pay | Admitting: Endocrinology

## 2017-01-10 ENCOUNTER — Ambulatory Visit (INDEPENDENT_AMBULATORY_CARE_PROVIDER_SITE_OTHER): Payer: Medicare Other | Admitting: Endocrinology

## 2017-01-10 VITALS — BP 129/95 | HR 94 | Wt 188.6 lb

## 2017-01-10 DIAGNOSIS — Z794 Long term (current) use of insulin: Secondary | ICD-10-CM | POA: Diagnosis not present

## 2017-01-10 DIAGNOSIS — E1122 Type 2 diabetes mellitus with diabetic chronic kidney disease: Secondary | ICD-10-CM

## 2017-01-10 DIAGNOSIS — N186 End stage renal disease: Secondary | ICD-10-CM | POA: Diagnosis not present

## 2017-01-10 DIAGNOSIS — Z992 Dependence on renal dialysis: Secondary | ICD-10-CM | POA: Diagnosis not present

## 2017-01-10 NOTE — Progress Notes (Signed)
Subjective:    Patient ID: Morgan [name], female    DOB: [birth year], 46 y.o.   MRN: [id number]  HPI Pt returns for f/u of diabetes mellitus: DM type: Insulin-requiring type 2 Dx'ed: 1998.  Complications: polyneuropathy, proliferative retinopathy, and ESRD.  Therapy: insulin since dx.   GDM: never DKA: never Severe hypoglycemia: never.  Pancreatitis: never.   Other: She took an intentional insulin overdose in 2014, in a suicide attempt; therapy has been limited by noncompliance, so she is on a qd insulin schedule; she had a renal transplant 2 weeks ago. Interval history: Pt says she ran out of insulin because insurance does not cover. She does not know what her insurance does cover.  We downloaded continuous glucose monitor data, and reviewed together.  It varies from 75-400, but most are in the 200's.  It is highest at lunch.  Otherwise, there is no trend throughout the day.  She c/o excessive appetite.   Past Medical History:  Diagnosis Date  . Anemia    low iron  . Constipation    when given iron  . Diabetes mellitus without complication ([place])    type 2  . Diabetic retinopathy ([place])   . H/O detached retina repair    bilateral  . Hypertension   . Pneumonia   . Renal disorder    dialysis on T/Th/Sa    Past Surgical History:  Procedure Laterality Date  . ARTERIOVENOUS GRAFT PLACEMENT Left 07/2010  . basilic vein transposition Left 2011  . CESAREAN SECTION    . EYE SURGERY     retinal detachment  . INSERTION OF ARTERIOVENOUS (AV) GORE-TEX GRAFT RIGHT  ARM USING 4-7 MM X 45 CM STRETCH GRAFT. Right 11/25/2015   Performed by Angelia Mould, MD at [facility]  . LAPAROSCOPIC CHOLECYSTECTOMY N/A 10/08/2014   Performed by Erroll Luna, MD at [place] GRAFT Left 05/13/2015   Performed by Angelia Mould, MD at [facility] History   Socioeconomic History  . Marital status: Married    Spouse name: Not on file  .  Number of children: Not on file  . Years of education: Not on file  . Highest education level: Not on file  Social Needs  . Financial resource strain: Not on file  . Food insecurity - worry: Not on file  . Food insecurity - inability: Not on file  . Transportation needs - medical: Not on file  . Transportation needs - non-medical: Not on file  Occupational History  . Not on file  Tobacco Use  . Smoking status: Never Smoker  . Smokeless tobacco: Never Used  Substance and Sexual Activity  . Alcohol use: No    Alcohol/week: 0.0 oz  . Drug use: No  . Sexual activity: Not on file  Other Topics Concern  . Not on file  Social History Narrative  . Not on file    Current Outpatient Medications on File Prior to Visit  Medication Sig Dispense Refill  . aspirin 81 MG tablet Take 81 mg by mouth daily.    . B Complex-C-Folic Acid (RENA-VITE PO) Take 1 tablet by mouth daily.    . Continuous Blood Gluc Sensor (FREESTYLE LIBRE 14 DAY SENSOR) MISC 1 Device by Does not apply route once a week. 10 each 3  . glucose blood (ONE TOUCH ULTRA TEST) test strip 1 each by Other route 2 (two) times daily. And lancets 2/day 100 each 12  .  insulin NPH-regular Human (NOVOLIN 70/30) (70-30) 100 UNIT/ML injection 32 units with breakfast, and 4 units with supper, and syringes 2/day. 20 mL 11  . Insulin Syringe-Needle U-100 (INSULIN SYRINGE 1CC/31GX5/16") 31G X 5/16" 1 ML MISC Use to inject insulin 3 times per day.  Dx- Z79.4, E11.22 200 each 2  . levonorgestrel (MIRENA) 20 MCG/24HR IUD 1 each by Intrauterine route once.    . lidocaine-prilocaine (EMLA) cream Apply 1 application topically daily as needed (dialysis).     . mycophenolate (CELLCEPT) 250 MG capsule Take 1,000 mg by mouth 2 (two) times daily.    . polyethylene glycol (MIRALAX / GLYCOLAX) packet Take 17 g by mouth daily.    . predniSONE (DELTASONE) 5 MG tablet Take 5 mg by mouth daily with breakfast.    . ranitidine (ZANTAC) 150 MG tablet Take 150 mg by  mouth 2 (two) times daily.    . sevelamer carbonate (RENVELA) 800 MG tablet Take 4,000 mg by mouth 3 (three) times daily with meals.     . Sulfamethoxazole-Trimethoprim (BACTRIM PO) Take by mouth.    . tacrolimus (PROGRAF) 1 MG capsule Take 9 mg by mouth 2 (two) times daily. 5 in the morning and 4 at night.    . valGANciclovir (VALCYTE) 450 MG tablet Take by mouth daily. 1 tab every other day.      No current facility-administered medications on file prior to visit.     Allergies  Allergen Reactions  . Fluorescein Shortness Of Breath and Other (See Comments)    Hypertension  . Iodinated Diagnostic Agents Anaphylaxis, Hives and Other (See Comments)    Diffuse swelling  . Iohexol Anaphylaxis, Hives, Itching, Swelling and Other (See Comments)    13 HR PRE-MEDS REQUIRED  . Latex Itching    Family History  Problem Relation Age of Onset  . Diabetes Maternal Grandmother     BP (!) 129/95 (BP Location: Other (Comment), Patient Position: Sitting, Cuff Size: Normal) Comment (BP Location): Had to use right leg due to dialysis access in both arms  Pulse 94   Wt 188 lb 9.6 oz (85.5 kg)   SpO2 95%   BMI 34.50 kg/m   Review of Systems She denies hypoglycemia.      Objective:   Physical Exam VITAL SIGNS:  See vs page GENERAL: no distress.  Pulses: foot pulses are intact bilaterally.   MSK: no deformity of the feet or ankles.  CV: trace bilat edema of the legs.  Skin:  no ulcer on the feet or ankles.  normal color and temp on the feet and ankles Neuro: sensation is intact to touch on the feet and ankles, but decreased from normal.      Assessment & Plan:  Insulin-requiring type 2 DM, with PDR: poor glycemic control.   Noncompliance with cbg recording and insulin: I'll work around this as best I canQD would be preferable, if ins pays.  Poor social situation: I asked [facility] for help.   Patient Instructions  check your blood sugar twice a day.  vary the time of day when you check,  between before the 3 meals, and at bedtime.  also check if you have symptoms of your blood sugar being too high or too low.  please keep a record of the readings and bring it to your next appointment here (or you can bring the meter itself).  You can write it on any piece of paper.  please call us sooner if your blood sugar goes below 70, or  if you have a lot of readings over 200.   Please continue the 70/30 insulin: 70/30, 32 units with breakfast, and 4 units with supper.   On this type of insulin schedule, you should eat meals on a regular schedule.  If a meal is missed or significantly delayed, your blood sugar could go low.  Someone from [facility] will call you back.  When we find out, we'll try to change to the once a day insulin if we can.  Please come back for a follow-up appointment in 2 months.

## 2017-01-10 NOTE — Patient Instructions (Addendum)
check your blood sugar twice a day.  vary the time of day when you check, between before the 3 meals, and at bedtime.  also check if you have symptoms of your blood sugar being too high or too low.  please keep a record of the readings and bring it to your next appointment here (or you can bring the meter itself).  You can write it on any piece of paper.  please call us sooner if your blood sugar goes below 70, or if you have a lot of readings over 200.   Please continue the 70/30 insulin: 70/30, 32 units with breakfast, and 4 units with supper.   On this type of insulin schedule, you should eat meals on a regular schedule.  If a meal is missed or significantly delayed, your blood sugar could go low.  Someone from [facility] will call you back.  When we find out, we'll try to change to the once a day insulin if we can.  Please come back for a follow-up appointment in 2 months.

## 2017-01-11 ENCOUNTER — Other Ambulatory Visit: Payer: Self-pay

## 2017-01-11 ENCOUNTER — Telehealth: Payer: Self-pay | Admitting: Pharmacist

## 2017-01-11 ENCOUNTER — Telehealth: Payer: Self-pay | Admitting: *Deleted

## 2017-01-11 ENCOUNTER — Ambulatory Visit: Payer: Self-pay | Admitting: Endocrinology

## 2017-01-11 NOTE — Patient Outreach (Signed)
[place] [facility]) Care Management  01/11/2017  [place] January 22, 1971 [id number]   Telephone Screen  Referral Date: 01/10/17 Referral Source: MD office(Dr. Loanne Drilling) Referral Reason: "recent renal transplant, patient ran out of insulin because insurance does not cover, ran out of meds" Insurance: Medicare   Outreach attempt # 1  to patient. Spoke with patient and screening completed.   Social: Patient resides in her home along with her spouse. She voices that spouse works during the day. She is independent with ADLs/IADLs. She denies any falls. Patient voices no DME in the home except for cbg meter. She relies on her spouse to drive her to MD appts. She voices she is in the process of applying for [employer] and she uses public transportation sometimes. She was not interested in further transportation resources at this time.   Conditions: Per chart review patient has PMH of DM,ESRD, polyneuropathy, proliferative retinopathy and renal transplant. Patient reports that her blood sugars continue to fluctuate and MD has been making adjustments. She has not checked her blood sugar this morning. She reports cbg yesterday was 381. Last A1C on file was 7.9(March 2018). Patient reports that she was on dialysis until March of this year she underwent a renal transplant. She voices no issues with transplant at this time.   Medications: Patient managing her own meds. She is taking about 10 meds. She states she has had ongoing issues with her insurance and paying for insulin. She reports that her insulin had been free with co-pays until recently she now has to start paying co-pays. She is unsure of why she has co-pays. She voices that she is unable to afford insulin as she does not work and only income is what her spouse brings in. She ran out of insulin yesterday and only had  enough to take half a dose then. She also reports she has been having issues with [facility] mail order. She  is out of her anti-rejection meds and hopes that it will be delivered today. Patient very anxious and concerned about being out her major meds and what it possibly might due to her overall health especially her transplant.  [place] referral already pending.    Appointments: Patient followed by Dr. Ellison(endocrinologist) and Dr. Barbra Sarks). She voices that her transplant team is at [facility].    Consent: [facility] services reviewed and discussed with patient. Verbal consent for services given. Patient voices no RN CM needs or concerns. However, she is agreeable to [place] services for additional support and education in managing her conditions.   Plan: RN CM will notify [facility] administrative assistant of case status. RN CM contacted assigned [facility] pharmacist(Katina) and provided update regarding patient med issues.  RN CM will send RN Health Coach referral for further disease management education and support.    Enzo Montgomery, RN,BSN,CCM [place] Management Telephonic Care Management Coordinator Direct Phone: [phone] Toll Free: [phone] Fax: [phone]

## 2017-01-11 NOTE — Telephone Encounter (Signed)
-----   Message from Virgel Manifold sent at 01/10/2017  1:57 PM EST ----- Regarding: Referral - Renato Shin, MD [place],  A referral was received from Dr. Renato Shin for Alanson Puls. I have indexed it to EPIC under the Media Tab.  Patient will be marked "Pending" until further notice.  Thank you, Alycia Rossetti

## 2017-01-11 NOTE — Patient Outreach (Signed)
[place] [facility]) Care Management  01/11/2017  [place] 1970/09/28 [id number]   Urgent referral received for patient stating she was completely out of insulin and was also awaiting her kidney transplant medications from her specialty pharmacy.  Unfortunately, patient was called on both numbers but did not answer. A  HIPAA compliant message was left on both numbers.  The [facility]'s community [facility] was [facility]. The pharmacist at [facility] confirmed the patient picked up a vial of Novolin 70/30 today and the copay was $24.88.  Plan:  Follow up with the patient in 1-3 business days.   Elayne Guerin, PharmD, [place] Clinical Pharmacist [phone]

## 2017-01-11 NOTE — Telephone Encounter (Signed)
I called patient but received no answer. I left her a VM stating that we had discount cards for Tresiba at office & she could come get one at her convience. I also stated that she could go to [email or web address] as well. I stated that she could call back Monday with any further questions.

## 2017-01-11 NOTE — Telephone Encounter (Signed)
Patient called and states that Dr. Loanne Drilling wants her to be on the Tresiba, But her insurance is asking for a high copay. Patient states that it is a program that she can be apart of which will help her with the medication cost. Patient is requesting a call back. Her contact number is [phone]. Please advise. Thank you

## 2017-01-14 ENCOUNTER — Other Ambulatory Visit: Payer: Self-pay | Admitting: Pharmacist

## 2017-01-14 ENCOUNTER — Ambulatory Visit: Payer: Self-pay | Admitting: Pharmacist

## 2017-01-14 NOTE — Patient Outreach (Signed)
[place] [facility]) Care Management  01/14/2017  [place] 1970-10-16 [id number]   Urgent referral received for patient stating she was completely out of insulin and was also awaiting her kidney transplant medications from her specialty pharmacy on 01/11/17.  Unfortunately, patient was called on both numbers but did not answer last week and today. A  HIPAA compliant message was left on both numbers.  The [facility]'s community [facility] was called on 01/11/17.   The pharmacist at [facility] confirmed the patient picked up a vial of Novolin 70/30 today and the copay was $24.88.  Plan:  Follow up with the patient in 5-7 business days.   Elayne Guerin, PharmD, [place] Clinical Pharmacist [phone]

## 2017-01-16 ENCOUNTER — Other Ambulatory Visit: Payer: Self-pay

## 2017-01-16 NOTE — Patient Outreach (Signed)
[facility]organ [name]) Care Management  01/16/2017  Morgan [name] [id number] [birth year] [id number]   1st Telephone call to patient for initial assessment.  No answer. HIPAA compliant voice message left.  Plan: RN Health Coach will make outreach attempt to patient within the month of November.  [name] RN, BSN, [place] Direct Dial:  [phone] Fax: [phone]

## 2017-01-23 ENCOUNTER — Ambulatory Visit: Payer: Self-pay | Admitting: Pharmacist

## 2017-01-23 ENCOUNTER — Other Ambulatory Visit: Payer: Self-pay | Admitting: Pharmacist

## 2017-01-23 NOTE — Patient Outreach (Signed)
[place] [facility]) Care Management  01/23/2017  [place] [birth year] [id number]   Urgent referral received for patient stating she was completely out of insulin and was also awaiting her kidney transplant medications from her specialty pharmacy on 01/11/17. Unfortunately, patient continues to be difficult to reach. HIPAA compliant message left on voicemail.  The [facility]'s community [facility] was called on 01/11/17.   The pharmacist at [facility] confirmed the patient picked up a vial of Novolin 70/30 today and the copay was $24.88.  Plan:  Send patient an unsuccessful contact letter Close case if I do not hear back from the patient.  Elayne Guerin, PharmD, [place] Clinical Pharmacist [phone]

## 2017-01-25 ENCOUNTER — Other Ambulatory Visit: Payer: Self-pay

## 2017-01-25 NOTE — Patient Outreach (Signed)
[place] [facility]) Care Management  01/25/2017  [place] Heenan [id number] Jul 10, 1970 [id number]    Telephone called to patient for initial assessment. HIPAA verified. Disscussed [facility] care management services and the patient states that she did not need any help at this time. She stated that she just had a kidney transplant with [facility] and they are handling all of her needs. RN Health Coach thanked her for her time.  Plan: RN Health Coach will send unsuccessful outreach letter to patient and will close case.    [name] RN, BSN, [place] Direct Dial:  [phone] Fax: [phone]

## 2017-01-31 ENCOUNTER — Telehealth: Payer: Self-pay | Admitting: Endocrinology

## 2017-01-31 ENCOUNTER — Other Ambulatory Visit: Payer: Self-pay

## 2017-01-31 DIAGNOSIS — Z961 Presence of intraocular lens: Secondary | ICD-10-CM | POA: Insufficient documentation

## 2017-01-31 MED ORDER — GLUCOSE BLOOD VI STRP: 1.0000 | ORAL_STRIP | Freq: Two times a day (BID) | 12 refills | Status: DC

## 2017-01-31 NOTE — Telephone Encounter (Signed)
Patient ask if dr Loanne Drilling called in her test strips, please advise

## 2017-01-31 NOTE — Telephone Encounter (Signed)
Done

## 2017-02-01 ENCOUNTER — Telehealth: Payer: Self-pay

## 2017-02-01 ENCOUNTER — Other Ambulatory Visit: Payer: Self-pay

## 2017-02-01 MED ORDER — INSULIN DEGLUDEC 100 UNIT/ML ~~LOC~~ SOPN: 35.0000 [IU] | PEN_INJECTOR | Freq: Every day | SUBCUTANEOUS | 11 refills | Status: DC

## 2017-02-01 NOTE — Telephone Encounter (Signed)
Called patient & notified her that prescription was sent to phramacy.

## 2017-02-01 NOTE — Addendum Note (Signed)
Addended by: Renato Shin on: 02/01/2017 11:38 AM   Modules accepted: Orders

## 2017-02-01 NOTE — Telephone Encounter (Signed)
I called patient & left VM for her to call back to verify.

## 2017-02-01 NOTE — Telephone Encounter (Signed)
We have pt on 70/30.  Please verify what pt is actually taking.

## 2017-02-01 NOTE — Telephone Encounter (Signed)
The pt is taking the 70/30 but she is requesting for Dr. Loanne Drilling to call in the tresiba. According to the pt she spoke with Dr. Loanne Drilling in person 2 weeks ago about getting tresiba and she picked up some copay cards in order to help her to pay for it but we have not called in a rx yet. She did express frustration towards me, spoke in a harsh manner, I did inform her I would put everything in the note in order to help [place] fully understand the situation and that I would set it as high priority. She hung up on me.

## 2017-02-01 NOTE — Telephone Encounter (Signed)
Patient stated she called in yesterday for Morgan [name] but the note I see says test strips- patient needs Morgan [name] called [facility] as soon as possible

## 2017-02-01 NOTE — Telephone Encounter (Signed)
Is patient supposed to be on tresiba? Please advise?

## 2017-02-01 NOTE — Telephone Encounter (Signed)
I sent rx

## 2017-02-08 ENCOUNTER — Encounter: Payer: Self-pay | Admitting: Pharmacist

## 2017-02-08 ENCOUNTER — Other Ambulatory Visit: Payer: Self-pay | Admitting: Pharmacist

## 2017-02-08 ENCOUNTER — Ambulatory Visit: Payer: Self-pay | Admitting: Pharmacist

## 2017-02-08 NOTE — Patient Outreach (Signed)
[place] [facility]) Care Management  02/08/2017  Morgan [name] [id number] 1970/11/12 [id number]   Patient's case was closed due to unsuccessful contact. A discipline closure letter was sent to the patient's provider. Patient is still being followed by [facility] however she has been unable to reach the patient as well.  Plan: I will send [place] a message.  Elayne Guerin, PharmD, [place] Clinical Pharmacist [phone]

## 2017-02-28 ENCOUNTER — Ambulatory Visit: Payer: Self-pay | Admitting: Endocrinology

## 2017-02-28 DIAGNOSIS — Z0289 Encounter for other administrative examinations: Secondary | ICD-10-CM

## 2017-03-12 ENCOUNTER — Ambulatory Visit: Payer: Self-pay | Admitting: Endocrinology

## 2017-04-02 ENCOUNTER — Ambulatory Visit: Payer: Self-pay | Admitting: Endocrinology

## 2017-04-02 DIAGNOSIS — Z0289 Encounter for other administrative examinations: Secondary | ICD-10-CM

## 2017-07-18 ENCOUNTER — Telehealth: Payer: Self-pay | Admitting: Endocrinology

## 2017-07-18 ENCOUNTER — Other Ambulatory Visit: Payer: Self-pay

## 2017-07-18 MED ORDER — FREESTYLE LIBRE 14 DAY READER DEVI: 1.0000 | 0 refills | Status: DC | PRN

## 2017-07-18 NOTE — Telephone Encounter (Signed)
Per [facility]-Patient has initial 10 day monitor for the [facility] for the disk that goes into her arm to check blood sugar for 14 days. Pt has mis[placed her meter. Pt currently has two disk that she has not used. Pt says the script is not written where she can get the 10 day monitor for the disk. Pt wants to know if Dr. Loanne Drilling has the ability to rewrite the script or does Pt have to call the company.

## 2017-07-18 NOTE — Telephone Encounter (Signed)
I LVM with patient stating I sent in 14 day [place] reader. If this was not correct I asked that she call back.

## 2017-07-18 NOTE — Telephone Encounter (Signed)
I called patient to clarify message that was sent. It appears that patient just needed 14 day reader sent in because she had previous prescription for sensors. I asked if this wasn't correct for her to call back to let us know.

## 2017-07-18 NOTE — Telephone Encounter (Signed)
error 

## 2017-07-18 NOTE — Telephone Encounter (Signed)
I spoke with patient & she has made follow up appt. I also informed her of her no shows as well as cancellations. I told her to continue to get her medications that she needed to come in for her OV.

## 2017-07-18 NOTE — Telephone Encounter (Signed)
Patient is calling on the status of last message, and want some one to call her back today.

## 2017-07-18 NOTE — Telephone Encounter (Signed)
please call patient: Can you please give her final warning before d/c from practice (mult no-shows and cancellations)?

## 2017-07-25 ENCOUNTER — Telehealth: Payer: Self-pay | Admitting: Endocrinology

## 2017-07-25 NOTE — Telephone Encounter (Signed)
Patient insulin Morgan [name] is to expensive, do we have any copay cards, or any samples. Please advise

## 2017-07-25 NOTE — Telephone Encounter (Signed)
Patient coming in for a co-pay card today

## 2017-07-26 ENCOUNTER — Telehealth: Payer: Self-pay | Admitting: Endocrinology

## 2017-07-26 MED ORDER — INSULIN GLARGINE 100 UNIT/ML SOLOSTAR PEN: 35.0000 [IU] | PEN_INJECTOR | SUBCUTANEOUS | 11 refills | Status: DC

## 2017-07-26 NOTE — Telephone Encounter (Signed)
I have sent a prescription to your pharmacy, to change to lantus.  Same 35 units qam I'll see you next time.

## 2017-07-26 NOTE — Telephone Encounter (Signed)
Patient stated she tried to use the tresiba copay card at the drug store and it was still going to cost her $200.  She would like to know if we have samples or if there is another medication that is less expensive . She does not have any tresiba to give herself the shot for tomorrow . She states she has humulin r and doesn't want to take that unless this is okay.    Please advise

## 2017-07-26 NOTE — Telephone Encounter (Signed)
Pt informed of below.  

## 2017-08-22 ENCOUNTER — Encounter: Payer: Self-pay | Admitting: Endocrinology

## 2017-08-22 ENCOUNTER — Ambulatory Visit (INDEPENDENT_AMBULATORY_CARE_PROVIDER_SITE_OTHER): Payer: Managed Care, Other (non HMO) | Admitting: Endocrinology

## 2017-08-22 VITALS — BP 132/82 | HR 96 | Ht 62.0 in | Wt 188.0 lb

## 2017-08-22 DIAGNOSIS — E1122 Type 2 diabetes mellitus with diabetic chronic kidney disease: Secondary | ICD-10-CM

## 2017-08-22 DIAGNOSIS — N186 End stage renal disease: Secondary | ICD-10-CM

## 2017-08-22 DIAGNOSIS — Z794 Long term (current) use of insulin: Secondary | ICD-10-CM

## 2017-08-22 DIAGNOSIS — Z992 Dependence on renal dialysis: Secondary | ICD-10-CM

## 2017-08-22 LAB — POCT GLYCOSYLATED HEMOGLOBIN (HGB A1C): Hemoglobin A1C: 12.5 % — AB (ref 4.0–5.6)

## 2017-08-22 MED ORDER — INSULIN DEGLUDEC 100 UNIT/ML ~~LOC~~ SOPN: 35.0000 [IU] | PEN_INJECTOR | Freq: Every day | SUBCUTANEOUS | 11 refills | Status: DC

## 2017-08-22 NOTE — Patient Instructions (Addendum)
check your blood sugar twice a day.  vary the time of day when you check, between before the 3 meals, and at bedtime.  also check if you have symptoms of your blood sugar being too high or too low.  please keep a record of the readings and bring it to your next appointment here (or you can bring the meter itself).  You can write it on any piece of paper.  please call us sooner if your blood sugar goes below 70, or if you have a lot of readings over 200.   Here is another [place] copay discount card.  Please tell the person there that you do not have Medicare pard D, and that you get tresiba through [place].  On this type of insulin schedule, you should eat meals on a regular schedule.  If a meal is missed or significantly delayed, your blood sugar could go low.  Please come back for a follow-up appointment in 2 months.

## 2017-08-22 NOTE — Progress Notes (Signed)
Subjective:    Patient ID: Morgan [name], female    DOB: 1971-01-07, 47 y.o.   MRN: [id number]  HPI Pt returns for f/u of diabetes mellitus: DM type: Insulin-requiring type 2 Dx'ed: 1998.  Complications: polyneuropathy, proliferative retinopathy, and ESRD.  Therapy: insulin since dx.   GDM: never DKA: never Severe hypoglycemia: never.  Pancreatitis: never.   Other: She took an intentional insulin overdose in 2014, in a suicide attempt; therapy has been limited by noncompliance, so she is on a qd insulin schedule; she had a renal transplant 2 weeks ago. Interval history: Pt says she ran out of tresiba, because insurance does not cover. She says discount card was declined, because she has medicare (but not part-D--she gets meds through [employer]).  Pt says cbg's were well-controlled on tresiba, but since off it, it is in the 200-300.   Past Medical History:  Diagnosis Date  . Anemia    low iron  . Constipation    when given iron  . Diabetes mellitus without complication ([place])    type 2  . Diabetic retinopathy ([place])   . H/O detached retina repair    bilateral  . Hypertension   . Pneumonia   . Renal disorder    dialysis on T/Th/Sa    Past Surgical History:  Procedure Laterality Date  . ARTERIOVENOUS GRAFT PLACEMENT Left 07/2010  . AV FISTULA PLACEMENT Right 11/25/2015   Procedure: INSERTION OF ARTERIOVENOUS (AV) GORE-TEX GRAFT RIGHT  ARM USING 4-7 MM X 45 CM STRETCH GRAFT.;  Surgeon: Angelia Mould, MD;  Location: [facility] OR;  Service: Vascular;  Laterality: Right;  . basilic vein transposition Left 2011  . CESAREAN SECTION    . CHOLECYSTECTOMY N/A 10/08/2014   Procedure: LAPAROSCOPIC CHOLECYSTECTOMY ;  Surgeon: Erroll Luna, MD;  Location: [facility];  Service: General;  Laterality: N/A;  . EYE SURGERY     retinal detachment  . REVISION OF ARTERIOVENOUS GORETEX GRAFT Left 05/13/2015   Procedure: REVISION OF ARTERIOVENOUS GORETEX GRAFT;  Surgeon:  Angelia Mould, MD;  Location: [facility];  Service: Vascular;  Laterality: Left;    Social History   Socioeconomic History  . Marital status: Married    Spouse name: Not on file  . Number of children: Not on file  . Years of education: Not on file  . Highest education level: Not on file  Occupational History  . Not on file  Social Needs  . Financial resource strain: Not on file  . Food insecurity:    Worry: Not on file    Inability: Not on file  . Transportation needs:    Medical: Not on file    Non-medical: Not on file  Tobacco Use  . Smoking status: Never Smoker  . Smokeless tobacco: Never Used  Substance and Sexual Activity  . Alcohol use: No    Alcohol/week: 0.0 oz  . Drug use: No  . Sexual activity: Not on file  Lifestyle  . Physical activity:    Days per week: Not on file    Minutes per session: Not on file  . Stress: Not on file  Relationships  . Social connections:    Talks on phone: Not on file    Gets together: Not on file    Attends religious service: Not on file    Active member of club or organization: Not on file    Attends meetings of clubs or organizations: Not on file    Relationship status: Not on  file  . Intimate partner violence:    Fear of current or ex partner: Not on file    Emotionally abused: Not on file    Physically abused: Not on file    Forced sexual activity: Not on file  Other Topics Concern  . Not on file  Social History Narrative  . Not on file    Current Outpatient Medications on File Prior to Visit  Medication Sig Dispense Refill  . aspirin 81 MG tablet Take 81 mg by mouth daily.    . B Complex-C-Folic Acid (RENA-VITE PO) Take 1 tablet by mouth daily.    . Continuous Blood Gluc Receiver (FREESTYLE LIBRE 14 DAY READER) DEVI 1 Device by Does not apply route as needed. 1 Device 0  . Continuous Blood Gluc Sensor (FREESTYLE LIBRE 14 DAY SENSOR) MISC 1 Device by Does not apply route once a week. 10 each 3  . glucose blood (ONE  TOUCH ULTRA TEST) test strip 1 each by Other route 2 (two) times daily. And lancets 2/day 100 each 12  . Insulin Syringe-Needle U-100 (INSULIN SYRINGE 1CC/31GX5/16") 31G X 5/16" 1 ML MISC Use to inject insulin 3 times per day.  Dx- Z79.4, E11.22 200 each 2  . levonorgestrel (MIRENA) 20 MCG/24HR IUD 1 each by Intrauterine route once.    . lidocaine-prilocaine (EMLA) cream Apply 1 application topically daily as needed (dialysis).     . mycophenolate (CELLCEPT) 250 MG capsule Take 1,000 mg by mouth 2 (two) times daily.    . polyethylene glycol (MIRALAX / GLYCOLAX) packet Take 17 g by mouth daily.    . predniSONE (DELTASONE) 5 MG tablet Take 5 mg by mouth daily with breakfast.    . ranitidine (ZANTAC) 150 MG tablet Take 150 mg by mouth 2 (two) times daily.    . sevelamer carbonate (RENVELA) 800 MG tablet Take 4,000 mg by mouth 3 (three) times daily with meals.     . Sulfamethoxazole-Trimethoprim (BACTRIM PO) Take by mouth.    . tacrolimus (PROGRAF) 1 MG capsule Take 9 mg by mouth 2 (two) times daily. 5 in the morning and 4 at night.    . valGANciclovir (VALCYTE) 450 MG tablet Take by mouth daily. 1 tab every other day.      No current facility-administered medications on file prior to visit.     Allergies  Allergen Reactions  . Fluorescein Shortness Of Breath and Other (See Comments)    Hypertension  . Iodinated Diagnostic Agents Anaphylaxis, Hives and Other (See Comments)    Diffuse swelling  . Iohexol Anaphylaxis, Hives, Itching, Swelling and Other (See Comments)    13 HR PRE-MEDS REQUIRED  . Latex Itching    Family History  Problem Relation Age of Onset  . Diabetes Maternal Grandmother     BP 132/82 (BP Location: Right Arm, Patient Position: Sitting, Cuff Size: Normal)   Pulse 96   Ht 5\' 2"  (1.575 m)   Wt 188 lb (85.3 kg)   SpO2 97%   BMI 34.39 kg/m    Review of Systems She denies hypoglycemia    Objective:   Physical Exam VITAL SIGNS:  See vs page GENERAL: no distress.    Pulses: foot pulses are intact bilaterally.   MSK: no deformity of the feet or ankles.  CV: trace bilat edema of the legs.  Skin:  no ulcer on the feet or ankles.  normal color and temp on the feet and ankles Neuro: sensation is intact to touch on the feet and  ankles, but decreased from normal.   Lab Results  Component Value Date   HGBA1C 12.5 (A) 08/22/2017       Assessment & Plan:  Insulin-requiring type 2 DM, with ESRD: worse.   Noncompliance with cbg recording and insulin.  She is not a candidate for multiple daily injections  Patient Instructions  check your blood sugar twice a day.  vary the time of day when you check, between before the 3 meals, and at bedtime.  also check if you have symptoms of your blood sugar being too high or too low.  please keep a record of the readings and bring it to your next appointment here (or you can bring the meter itself).  You can write it on any piece of paper.  please call us sooner if your blood sugar goes below 70, or if you have a lot of readings over 200.   Here is another [place] copay discount card.  Please tell the person there that you do not have Medicare pard D, and that you get tresiba through [place].  On this type of insulin schedule, you should eat meals on a regular schedule.  If a meal is missed or significantly delayed, your blood sugar could go low.  Please come back for a follow-up appointment in 2 months.

## 2017-10-23 ENCOUNTER — Ambulatory Visit: Payer: Self-pay | Admitting: Endocrinology

## 2017-10-29 ENCOUNTER — Ambulatory Visit: Payer: Self-pay | Admitting: Endocrinology

## 2017-10-29 DIAGNOSIS — Z0289 Encounter for other administrative examinations: Secondary | ICD-10-CM

## 2017-12-16 ENCOUNTER — Other Ambulatory Visit: Payer: Self-pay

## 2017-12-16 ENCOUNTER — Telehealth: Payer: Self-pay | Admitting: Endocrinology

## 2017-12-16 MED ORDER — FREESTYLE LIBRE 14 DAY SENSOR MISC: 1.0000 | 3 refills | Status: DC

## 2017-12-16 NOTE — Telephone Encounter (Signed)
Patient requests RX for [employer] 14 day sensor sent to [facility] on [address]

## 2017-12-18 ENCOUNTER — Ambulatory Visit (INDEPENDENT_AMBULATORY_CARE_PROVIDER_SITE_OTHER): Payer: Managed Care, Other (non HMO) | Admitting: Endocrinology

## 2017-12-18 ENCOUNTER — Encounter: Payer: Self-pay | Admitting: Endocrinology

## 2017-12-18 VITALS — HR 107 | Ht 62.5 in | Wt 187.0 lb

## 2017-12-18 DIAGNOSIS — N186 End stage renal disease: Secondary | ICD-10-CM | POA: Diagnosis not present

## 2017-12-18 DIAGNOSIS — E1122 Type 2 diabetes mellitus with diabetic chronic kidney disease: Secondary | ICD-10-CM

## 2017-12-18 DIAGNOSIS — Z992 Dependence on renal dialysis: Secondary | ICD-10-CM

## 2017-12-18 DIAGNOSIS — Z794 Long term (current) use of insulin: Secondary | ICD-10-CM

## 2017-12-18 LAB — POCT GLYCOSYLATED HEMOGLOBIN (HGB A1C): Hemoglobin A1C: 9.3 % — AB (ref 4.0–5.6)

## 2017-12-18 MED ORDER — INSULIN NPH (HUMAN) (ISOPHANE) 100 UNIT/ML ~~LOC~~ SUSP: 25.0000 [IU] | Freq: Every day | SUBCUTANEOUS | 11 refills | Status: DC

## 2017-12-18 MED ORDER — INSULIN REGULAR HUMAN 100 UNIT/ML IJ SOLN: 25.0000 [IU] | Freq: Every day | INTRAMUSCULAR | 11 refills | Status: DC

## 2017-12-18 NOTE — Progress Notes (Signed)
Subjective:    Patient ID: Morgan [name], female    DOB: Aug 21, 1970, 47 y.o.   MRN: [id number]  HPI Pt returns for f/u of diabetes mellitus: DM type: Insulin-requiring type 2 Dx'ed: 1998.  Complications: polyneuropathy, proliferative retinopathy, and ESRD.   Therapy: insulin since dx.   GDM: never DKA: never Severe hypoglycemia: never.  Pancreatitis: never.   Other: She took an intentional insulin overdose in 2014, in a suicide attempt; therapy has been limited by noncompliance, so she is on a qd insulin schedule; she had a renal transplant 2 weeks ago; she takes human insulin, due to cost Interval history: pt says she never misses the insulin.  no cbg record, but states cbg's vary from 51-310.  It is in general higher as the day goes on.  She takes 70/30, 35 units with breakfast, and PRN reg (total of 0-10 units/day).   Past Medical History:  Diagnosis Date  . Anemia    low iron  . Constipation    when given iron  . Diabetes mellitus without complication ([place])    type 2  . Diabetic retinopathy ([place])   . H/O detached retina repair    bilateral  . Hypertension   . Pneumonia   . Renal disorder    dialysis on T/Th/Sa    Past Surgical History:  Procedure Laterality Date  . ARTERIOVENOUS GRAFT PLACEMENT Left 07/2010  . AV FISTULA PLACEMENT Right 11/25/2015   Procedure: INSERTION OF ARTERIOVENOUS (AV) GORE-TEX GRAFT RIGHT  ARM USING 4-7 MM X 45 CM STRETCH GRAFT.;  Surgeon: Angelia Mould, MD;  Location: [facility] OR;  Service: Vascular;  Laterality: Right;  . basilic vein transposition Left 2011  . CESAREAN SECTION    . CHOLECYSTECTOMY N/A 10/08/2014   Procedure: LAPAROSCOPIC CHOLECYSTECTOMY ;  Surgeon: Erroll Luna, MD;  Location: [place];  Service: General;  Laterality: N/A;  . EYE SURGERY     retinal detachment  . REVISION OF ARTERIOVENOUS GORETEX GRAFT Left 05/13/2015   Procedure: REVISION OF ARTERIOVENOUS GORETEX GRAFT;  Surgeon: Angelia Mould, MD;   Location: [place];  Service: Vascular;  Laterality: Left;    Social History   Socioeconomic History  . Marital status: Married    Spouse name: Not on file  . Number of children: Not on file  . Years of education: Not on file  . Highest education level: Not on file  Occupational History  . Not on file  Social Needs  . Financial resource strain: Not on file  . Food insecurity:    Worry: Not on file    Inability: Not on file  . Transportation needs:    Medical: Not on file    Non-medical: Not on file  Tobacco Use  . Smoking status: Never Smoker  . Smokeless tobacco: Never Used  Substance and Sexual Activity  . Alcohol use: No    Alcohol/week: 0.0 standard drinks  . Drug use: No  . Sexual activity: Not on file  Lifestyle  . Physical activity:    Days per week: Not on file    Minutes per session: Not on file  . Stress: Not on file  Relationships  . Social connections:    Talks on phone: Not on file    Gets together: Not on file    Attends religious service: Not on file    Active member of club or organization: Not on file    Attends meetings of clubs or organizations: Not on file  Relationship status: Not on file  . Intimate partner violence:    Fear of current or ex partner: Not on file    Emotionally abused: Not on file    Physically abused: Not on file    Forced sexual activity: Not on file  Other Topics Concern  . Not on file  Social History Narrative  . Not on file    Current Outpatient Medications on File Prior to Visit  Medication Sig Dispense Refill  . aspirin 81 MG tablet Take 81 mg by mouth daily.    . B Complex-C-Folic Acid (RENA-VITE PO) Take 1 tablet by mouth daily.    . Continuous Blood Gluc Receiver (FREESTYLE LIBRE 14 DAY READER) DEVI 1 Device by Does not apply route as needed. 1 Device 0  . Continuous Blood Gluc Sensor (FREESTYLE LIBRE 14 DAY SENSOR) MISC 1 Device by Does not apply route once a week. 10 each 3  . glucose blood (ONE TOUCH ULTRA  TEST) test strip 1 each by Other route 2 (two) times daily. And lancets 2/day 100 each 12  . levonorgestrel (MIRENA) 20 MCG/24HR IUD 1 each by Intrauterine route once.    . lidocaine-prilocaine (EMLA) cream Apply 1 application topically daily as needed (dialysis).     . mycophenolate (CELLCEPT) 250 MG capsule Take 1,000 mg by mouth 2 (two) times daily.    . polyethylene glycol (MIRALAX / GLYCOLAX) packet Take 17 g by mouth daily.    . predniSONE (DELTASONE) 5 MG tablet Take 5 mg by mouth daily with breakfast.    . ranitidine (ZANTAC) 150 MG tablet Take 150 mg by mouth 2 (two) times daily.    . sevelamer carbonate (RENVELA) 800 MG tablet Take 4,000 mg by mouth 3 (three) times daily with meals.     . Sulfamethoxazole-Trimethoprim (BACTRIM PO) Take by mouth.    . tacrolimus (PROGRAF) 1 MG capsule Take 9 mg by mouth 2 (two) times daily. 5 in the morning .    . valGANciclovir (VALCYTE) 450 MG tablet Take by mouth daily. 1 tab every other day.      No current facility-administered medications on file prior to visit.     Allergies  Allergen Reactions  . Fluorescein Shortness Of Breath and Other (See Comments)    Hypertension  . Iodinated Diagnostic Agents Anaphylaxis, Hives and Other (See Comments)    Diffuse swelling  . Iohexol Anaphylaxis, Hives, Itching, Swelling and Other (See Comments)    13 HR PRE-MEDS REQUIRED  . Latex Itching    Family History  Problem Relation Age of Onset  . Diabetes Maternal Grandmother     Pulse (!) 107   Ht 5' 2.5" (1.588 m)   Wt 187 lb (84.8 kg)   SpO2 96%   BMI 33.66 kg/m    Review of Systems She denies LOC.      Objective:   Physical Exam VITAL SIGNS:  See vs page GENERAL: no distress.  Pulses: foot pulses are intact bilaterally.   MSK: no deformity of the feet or ankles.  CV: trace bilat edema of the legs.  Skin:  no ulcer on the feet or ankles.  normal color and temp on the feet and ankles Neuro: sensation is intact to touch on the feet  and ankles, but decreased from normal.   Lab Results  Component Value Date   HGBA1C 9.3 (A) 12/18/2017      Assessment & Plan:  Insulin-requiring type 2 DM: she needs increased rx.   ESRD: in  this context, she needs more R and less N. Noncompliance with medical rx: she is not a candidate for multiple daily injections.   Patient Instructions  check your blood sugar twice a day.  vary the time of day when you check, between before the 3 meals, and at bedtime.  also check if you have symptoms of your blood sugar being too high or too low.  please keep a record of the readings and bring it to your next appointment here (or you can bring the meter itself).  You can write it on any piece of paper.  please call us sooner if your blood sugar goes below 70, or if you have a lot of readings over 200.   Please change the 70/30 to R and N, 25 units of each, with breakfast.    On this type of insulin schedule, you should eat meals on a regular schedule.  If a meal is missed or significantly delayed, your blood sugar could go low.  Please come back for a follow-up appointment in 3 months.

## 2017-12-18 NOTE — Patient Instructions (Addendum)
check your blood sugar twice a day.  vary the time of day when you check, between before the 3 meals, and at bedtime.  also check if you have symptoms of your blood sugar being too high or too low.  please keep a record of the readings and bring it to your next appointment here (or you can bring the meter itself).  You can write it on any piece of paper.  please call us sooner if your blood sugar goes below 70, or if you have a lot of readings over 200.   Please change the 70/30 to R and N, 25 units of each, with breakfast.    On this type of insulin schedule, you should eat meals on a regular schedule.  If a meal is missed or significantly delayed, your blood sugar could go low.  Please come back for a follow-up appointment in 3 months.

## 2018-01-10 ENCOUNTER — Telehealth: Payer: Self-pay | Admitting: Endocrinology

## 2018-01-10 NOTE — Telephone Encounter (Signed)
Patient requests phone call re: started a new job, standing, blood sugars dropping while on shift. Please call patient at ph# [phone] to advise.

## 2018-01-10 NOTE — Telephone Encounter (Signed)
Low CBG's occur @ 730am

## 2018-01-10 NOTE — Telephone Encounter (Signed)
CBG's since staring job on 01/07/18 through today ranging 47-122 She works 1100pm to 730am  Novolin N 25 units before breakfast Novolin R 25 units with breakfast  Eating breakfast at noon, then lunch at 6pm and dinner at 2-3pm.  Please advise.

## 2018-01-10 NOTE — Telephone Encounter (Signed)
Please reduce Novolin N to 20 units before breakfast Continue Novolin R 25 units with breakfast.

## 2018-01-10 NOTE — Telephone Encounter (Signed)
I need to know what time of day the blood sugar is low.

## 2018-01-13 ENCOUNTER — Telehealth: Payer: Self-pay | Admitting: Endocrinology

## 2018-01-13 NOTE — Telephone Encounter (Signed)
This has been addressed in a previously created encounter. This encounter has been closed as duplicate.

## 2018-01-13 NOTE — Telephone Encounter (Signed)
Please call pt

## 2018-01-13 NOTE — Telephone Encounter (Signed)
Called pt and informed her of new orders. Verbalized acceptance and understanding.

## 2018-01-13 NOTE — Telephone Encounter (Signed)
Patient requesting a call back at ph# [phone]. Patient states she was supposed to be called by the end of last week re: insulin/dosage. Please call asap.

## 2018-01-20 ENCOUNTER — Telehealth: Payer: Self-pay | Admitting: Endocrinology

## 2018-01-20 DIAGNOSIS — M79673 Pain in unspecified foot: Secondary | ICD-10-CM

## 2018-01-20 NOTE — Telephone Encounter (Signed)
lft vm to return call to discuss referral

## 2018-01-20 NOTE — Telephone Encounter (Signed)
Returned pt call. States she works for long periods of time, wears steel toes shoes and is unable to take frequent breaks to reduce pressure on her feet. Has taken the weekend off and is just no feeling relief from the pain in her feet. She returns to work tonight and does not know if she should go. Asked if she was wanting a work excuse to allow her more time to allow her feet to recover. States she wants advice from Dr. Loanne Drilling about what she should do. Needs to work but can't stand for long periods of time with no relief. Please advise.

## 2018-01-20 NOTE — Telephone Encounter (Signed)
Please call patient (would like a call before end of business day today) at ph#[phone] to advise re: having issues with feet/steel toed shoes.

## 2018-01-20 NOTE — Telephone Encounter (Signed)
For your foot symptoms, I would be happy to refer you to a podiatrist.

## 2018-01-29 DIAGNOSIS — H4720 Unspecified optic atrophy: Secondary | ICD-10-CM | POA: Insufficient documentation

## 2018-02-04 ENCOUNTER — Encounter: Payer: Self-pay | Admitting: Podiatry

## 2018-02-04 ENCOUNTER — Ambulatory Visit (INDEPENDENT_AMBULATORY_CARE_PROVIDER_SITE_OTHER): Payer: Managed Care, Other (non HMO)

## 2018-02-04 ENCOUNTER — Ambulatory Visit (INDEPENDENT_AMBULATORY_CARE_PROVIDER_SITE_OTHER): Payer: Managed Care, Other (non HMO) | Admitting: Podiatry

## 2018-02-04 DIAGNOSIS — M778 Other enthesopathies, not elsewhere classified: Secondary | ICD-10-CM

## 2018-02-04 DIAGNOSIS — M779 Enthesopathy, unspecified: Secondary | ICD-10-CM

## 2018-02-04 DIAGNOSIS — M722 Plantar fascial fibromatosis: Secondary | ICD-10-CM | POA: Diagnosis not present

## 2018-02-04 DIAGNOSIS — E1142 Type 2 diabetes mellitus with diabetic polyneuropathy: Secondary | ICD-10-CM

## 2018-02-04 DIAGNOSIS — E1151 Type 2 diabetes mellitus with diabetic peripheral angiopathy without gangrene: Secondary | ICD-10-CM | POA: Diagnosis not present

## 2018-02-04 NOTE — Progress Notes (Signed)
Subjective:  Patient ID: Morgan [name], female    DOB: 07-03-1970,  MRN: [id number] HPI Chief Complaint  Patient presents with  . Foot Pain    Plantar feet bilateral (L>R) - patient states she started a new job 6 weeks ago wearing steel toe boots, pain with standing at work all day, swelling, also concerned about dark spot arch left, rubs aspercreme with lanocaine on feet BID at work "just to make it through the day"  . Diabetes    Diabetic since 1998 and last a1c was 9.3 - also on dialysis for 7.5 years  . New Patient (Initial Visit)    47 y.o. female presents with the above complaint.   ROS: Denies fever chills nausea vomiting muscle aches pains calf pain back pain chest pain shortness of breath.  Past Medical History:  Diagnosis Date  . Anemia    low iron  . Constipation    when given iron  . Diabetes mellitus without complication ([place])    type 2  . Diabetic retinopathy ([place])   . H/O detached retina repair    bilateral  . Hypertension   . Pneumonia   . Renal disorder    dialysis on T/Th/Sa   Past Surgical History:  Procedure Laterality Date  . ARTERIOVENOUS GRAFT PLACEMENT Left 07/2010  . AV FISTULA PLACEMENT Right 11/25/2015   Procedure: INSERTION OF ARTERIOVENOUS (AV) GORE-TEX GRAFT RIGHT  ARM USING 4-7 MM X 45 CM STRETCH GRAFT.;  Surgeon: Angelia Mould, MD;  Location: [facility] OR;  Service: Vascular;  Laterality: Right;  . basilic vein transposition Left 2011  . CESAREAN SECTION    . CHOLECYSTECTOMY N/A 10/08/2014   Procedure: LAPAROSCOPIC CHOLECYSTECTOMY ;  Surgeon: Erroll Luna, MD;  Location: [place];  Service: General;  Laterality: N/A;  . EYE SURGERY     retinal detachment  . REVISION OF ARTERIOVENOUS GORETEX GRAFT Left 05/13/2015   Procedure: REVISION OF ARTERIOVENOUS GORETEX GRAFT;  Surgeon: Angelia Mould, MD;  Location: [facility] OR;  Service: Vascular;  Laterality: Left;    Current Outpatient Medications:  .  amLODipine (NORVASC) 5 MG tablet,  Take 5 mg by mouth at bedtime., Disp: , Rfl: 11 .  aspirin 81 MG tablet, Take 81 mg by mouth daily., Disp: , Rfl:  .  B Complex-C-Folic Acid (RENA-VITE PO), Take 1 tablet by mouth daily., Disp: , Rfl:  .  Continuous Blood Gluc Receiver (FREESTYLE LIBRE 14 DAY READER) DEVI, 1 Device by Does not apply route as needed., Disp: 1 Device, Rfl: 0 .  Continuous Blood Gluc Sensor (FREESTYLE LIBRE 14 DAY SENSOR) MISC, 1 Device by Does not apply route once a week., Disp: 10 each, Rfl: 3 .  fluconazole (DIFLUCAN) 150 MG tablet, TAKE 1 TABLET BY MOUTH ON DAY 1 4 AND 7, Disp: , Rfl: 0 .  folic acid (FOLVITE) 1 MG tablet, Take 1 mg by mouth daily., Disp: , Rfl: 0 .  glucose blood (ONE TOUCH ULTRA TEST) test strip, 1 each by Other route 2 (two) times daily. And lancets 2/day, Disp: 100 each, Rfl: 12 .  imiquimod (ALDARA) 5 % cream, USE THREE TIMES WEEKLY AT BEDTIME. WASH OFF WITH SOAP AND WATER 6 TO 10 HOURS LATER.  16 WEEKS. AVOID SEXUAL CONTACT WHILE CREAM IN ON SK, Disp: , Rfl: 0 .  insulin NPH Human (NOVOLIN N) 100 UNIT/ML injection, Inject 0.25 mLs (25 Units total) into the skin daily before breakfast., Disp: 10 mL, Rfl: 11 .  insulin regular (NOVOLIN R  RELION) 100 units/mL injection, Inject 0.25 mLs (25 Units total) into the skin daily with breakfast., Disp: 10 mL, Rfl: 11 .  levonorgestrel (MIRENA) 20 MCG/24HR IUD, 1 each by Intrauterine route once., Disp: , Rfl:  .  lidocaine-prilocaine (EMLA) cream, Apply 1 application topically daily as needed (dialysis). , Disp: , Rfl:  .  mycophenolate (CELLCEPT) 250 MG capsule, Take 1,000 mg by mouth 2 (two) times daily., Disp: , Rfl:  .  NOVOLIN 70/30 RELION (70-30) 100 UNIT/ML injection, INJECT 32 UNITS WITH BREAKFAST AND 4 UNITS WITH SUPPER, Disp: , Rfl: 11 .  polyethylene glycol (MIRALAX / GLYCOLAX) packet, Take 17 g by mouth daily., Disp: , Rfl:  .  predniSONE (DELTASONE) 5 MG tablet, Take 5 mg by mouth daily with breakfast., Disp: , Rfl:  .  ranitidine  (ZANTAC) 150 MG tablet, Take 150 mg by mouth 2 (two) times daily., Disp: , Rfl:  .  sevelamer carbonate (RENVELA) 800 MG tablet, Take 4,000 mg by mouth 3 (three) times daily with meals. , Disp: , Rfl:  .  tacrolimus (PROGRAF) 1 MG capsule, Take 9 mg by mouth 2 (two) times daily. 5 in the morning ., Disp: , Rfl:  .  valGANciclovir (VALCYTE) 450 MG tablet, Take by mouth daily. 1 tab every other day. , Disp: , Rfl:   Allergies  Allergen Reactions  . Fluorescein Shortness Of Breath and Other (See Comments)    Hypertension  . Iodinated Diagnostic Agents Anaphylaxis, Hives and Other (See Comments)    Diffuse swelling  . Iohexol Anaphylaxis, Hives, Itching, Swelling and Other (See Comments)    13 HR PRE-MEDS REQUIRED  . Latex Itching   Review of Systems Objective:  There were no vitals filed for this visit.  General: Well developed, nourished, in no acute distress, alert and oriented x3   Dermatological: Skin is warm, dry and supple bilateral. Nails x 10 are well maintained; remaining integument appears unremarkable at this time. There are no open sores, no preulcerative lesions, no rash or signs of infection present.  Vascular: Dorsalis Pedis artery and Posterior Tibial artery pedal pulses are 2/4 bilateral with immedate capillary fill time. Pedal hair growth present. No varicosities and no lower extremity edema present bilateral.   Neruologic: Grossly intact via light touch bilateral. Vibratory intact via tuning fork bilateral. Protective threshold with Semmes Wienstein monofilament intact to all pedal sites bilateral. Patellar and Achilles deep tendon reflexes 2+ bilateral. No Babinski or clonus noted bilateral.   Musculoskeletal: No gross boney pedal deformities bilateral. No pain, crepitus, or limitation noted with foot and ankle range of motion bilateral. Muscular strength 5/5 in all groups tested bilateral.  She is pain on palpation medial calcaneal tubercles bilaterally.  As well as  medial longitudinal arch is painful.  Complex with hammertoe deformities noted bilateral.  Gait: Unassisted, Nonantalgic.    Radiographs:  Radiographs taken today demonstrate rectus foot type mild pes planus mild hammertoe deformity soft tissue increase in density plantar fashion calcaneal insertion site.  Calcification of the major arteries and vessels of the foot.  Assessment & Plan:   Assessment: Diabetic peripheral neuropathy.  Plan a fasciitis diabetic angiopathy.    Plan: Referred her to [facility] for set of orthotics to be made for her work boots and we will consider the use of gabapentin next visit once we get the plantar fasciitis calm down.      T. Sparland, [place]

## 2018-02-13 ENCOUNTER — Telehealth: Payer: Self-pay | Admitting: Podiatry

## 2018-02-13 NOTE — Telephone Encounter (Signed)
Left message for pt to call to discuss orthotic coverage. 

## 2018-02-13 NOTE — Telephone Encounter (Signed)
Pt returned call and is wanting to proceed with the orthotics.

## 2018-02-27 ENCOUNTER — Ambulatory Visit (INDEPENDENT_AMBULATORY_CARE_PROVIDER_SITE_OTHER): Payer: Managed Care, Other (non HMO) | Admitting: Orthotics

## 2018-02-27 DIAGNOSIS — M779 Enthesopathy, unspecified: Secondary | ICD-10-CM

## 2018-02-27 DIAGNOSIS — E1151 Type 2 diabetes mellitus with diabetic peripheral angiopathy without gangrene: Secondary | ICD-10-CM

## 2018-02-27 DIAGNOSIS — M778 Other enthesopathies, not elsewhere classified: Secondary | ICD-10-CM

## 2018-02-27 DIAGNOSIS — E1142 Type 2 diabetes mellitus with diabetic polyneuropathy: Secondary | ICD-10-CM

## 2018-02-27 DIAGNOSIS — M722 Plantar fascial fibromatosis: Secondary | ICD-10-CM

## 2018-02-27 NOTE — Progress Notes (Signed)
Patient came in today for Eval/assessment for [facility] Balance Brace.  Patient presents with a history of falling, and also fearful of falling.  Balance tests performed and patient does have issues keeping balance especially in foot to heel test.  Patient has week dorsiflexors and well as inverters/everters muscle group.  Demonstrants ankle instability.   

## 2018-03-18 ENCOUNTER — Ambulatory Visit: Payer: Self-pay | Admitting: Endocrinology

## 2018-03-18 DIAGNOSIS — R202 Paresthesia of skin: Secondary | ICD-10-CM | POA: Diagnosis not present

## 2018-03-18 DIAGNOSIS — R200 Anesthesia of skin: Secondary | ICD-10-CM | POA: Diagnosis not present

## 2018-03-19 ENCOUNTER — Other Ambulatory Visit: Payer: Self-pay | Admitting: Endocrinology

## 2018-03-20 ENCOUNTER — Encounter: Payer: Self-pay | Admitting: Podiatry

## 2018-03-20 ENCOUNTER — Ambulatory Visit (INDEPENDENT_AMBULATORY_CARE_PROVIDER_SITE_OTHER): Payer: BLUE CROSS/BLUE SHIELD | Admitting: Podiatry

## 2018-03-20 DIAGNOSIS — E1151 Type 2 diabetes mellitus with diabetic peripheral angiopathy without gangrene: Secondary | ICD-10-CM

## 2018-03-20 DIAGNOSIS — M722 Plantar fascial fibromatosis: Secondary | ICD-10-CM

## 2018-03-20 DIAGNOSIS — E1142 Type 2 diabetes mellitus with diabetic polyneuropathy: Secondary | ICD-10-CM

## 2018-03-20 NOTE — Progress Notes (Signed)
She presents today for follow-up for neuropathy and plantar fasciitis states that they are sore right now but the orthotics seem to be helping.  Objective: Vital signs are stable alert oriented x3.  Pulses are palpable.  She has pain on palpation mid calcaneal tubercle of the left heel.  Assessment: Plantar fasciitis left heel.  Plan: After Betadine skin prep injected 1 mg Kenalog 5 mg Marcaine point maximal tenderness left.  Continue use of the orthotics follow-up with me in 4 to 6 weeks.

## 2018-03-26 ENCOUNTER — Other Ambulatory Visit: Payer: Self-pay | Admitting: Endocrinology

## 2018-03-26 DIAGNOSIS — D631 Anemia in chronic kidney disease: Secondary | ICD-10-CM | POA: Diagnosis not present

## 2018-03-26 DIAGNOSIS — Z940 Kidney transplant status: Secondary | ICD-10-CM | POA: Diagnosis not present

## 2018-03-26 DIAGNOSIS — N189 Chronic kidney disease, unspecified: Secondary | ICD-10-CM | POA: Diagnosis not present

## 2018-03-26 DIAGNOSIS — N2581 Secondary hyperparathyroidism of renal origin: Secondary | ICD-10-CM | POA: Diagnosis not present

## 2018-03-28 ENCOUNTER — Ambulatory Visit (INDEPENDENT_AMBULATORY_CARE_PROVIDER_SITE_OTHER): Payer: BLUE CROSS/BLUE SHIELD | Admitting: Endocrinology

## 2018-03-28 ENCOUNTER — Encounter: Payer: Self-pay | Admitting: Endocrinology

## 2018-03-28 VITALS — HR 94 | Ht 63.0 in | Wt 184.4 lb

## 2018-03-28 DIAGNOSIS — N186 End stage renal disease: Secondary | ICD-10-CM | POA: Diagnosis not present

## 2018-03-28 DIAGNOSIS — E1122 Type 2 diabetes mellitus with diabetic chronic kidney disease: Secondary | ICD-10-CM | POA: Diagnosis not present

## 2018-03-28 DIAGNOSIS — Z992 Dependence on renal dialysis: Secondary | ICD-10-CM

## 2018-03-28 DIAGNOSIS — Z794 Long term (current) use of insulin: Secondary | ICD-10-CM | POA: Diagnosis not present

## 2018-03-28 LAB — POCT GLYCOSYLATED HEMOGLOBIN (HGB A1C): Hemoglobin A1C: 9.7 % — AB (ref 4.0–5.6)

## 2018-03-28 MED ORDER — INSULIN GLARGINE 100 UNIT/ML SOLOSTAR PEN: 40.0000 [IU] | PEN_INJECTOR | Freq: Every day | SUBCUTANEOUS | 99 refills | Status: DC

## 2018-03-28 NOTE — Progress Notes (Signed)
Subjective:    Patient ID: Morgan [name], female    DOB: 07-01-1970, 48 y.o.   MRN: [id number]  HPI Pt returns for f/u of diabetes mellitus: DM type: Insulin-requiring type 2 Dx'ed: 1998.  Complications: polyneuropathy, proliferative retinopathy, and ESRD.   Therapy: insulin since dx.   GDM: never.  DKA: never.  Severe hypoglycemia: never.  Pancreatitis: never.   Other: She took an intentional insulin overdose in 2014, in a suicide attempt; therapy has been limited by noncompliance, so she is on a qd insulin schedule; she had a renal transplant 2 weeks ago; she takes human insulin, due to cost Interval history: she now works 3rd shift.  Insurance declined her insulins.  no cbg record, but states cbg's vary from 47-406.  Past Medical History:  Diagnosis Date  . Anemia    low iron  . Constipation    when given iron  . Diabetes mellitus without complication ([place])    type 2  . Diabetic retinopathy ([place])   . H/O detached retina repair    bilateral  . Hypertension   . Pneumonia   . Renal disorder    dialysis on T/Th/Sa    Past Surgical History:  Procedure Laterality Date  . ARTERIOVENOUS GRAFT PLACEMENT Left 07/2010  . AV FISTULA PLACEMENT Right 11/25/2015   Procedure: INSERTION OF ARTERIOVENOUS (AV) GORE-TEX GRAFT RIGHT  ARM USING 4-7 MM X 45 CM STRETCH GRAFT.;  Surgeon: Angelia Mould, MD;  Location: [facility] OR;  Service: Vascular;  Laterality: Right;  . basilic vein transposition Left 2011  . CESAREAN SECTION    . CHOLECYSTECTOMY N/A 10/08/2014   Procedure: LAPAROSCOPIC CHOLECYSTECTOMY ;  Surgeon: Erroll Luna, MD;  Location: [place];  Service: General;  Laterality: N/A;  . EYE SURGERY     retinal detachment  . REVISION OF ARTERIOVENOUS GORETEX GRAFT Left 05/13/2015   Procedure: REVISION OF ARTERIOVENOUS GORETEX GRAFT;  Surgeon: Angelia Mould, MD;  Location: [place];  Service: Vascular;  Laterality: Left;    Social History   Socioeconomic History  .  Marital status: Married    Spouse name: Not on file  . Number of children: Not on file  . Years of education: Not on file  . Highest education level: Not on file  Occupational History  . Not on file  Social Needs  . Financial resource strain: Not on file  . Food insecurity:    Worry: Not on file    Inability: Not on file  . Transportation needs:    Medical: Not on file    Non-medical: Not on file  Tobacco Use  . Smoking status: Never Smoker  . Smokeless tobacco: Never Used  Substance and Sexual Activity  . Alcohol use: No    Alcohol/week: 0.0 standard drinks  . Drug use: No  . Sexual activity: Not on file  Lifestyle  . Physical activity:    Days per week: Not on file    Minutes per session: Not on file  . Stress: Not on file  Relationships  . Social connections:    Talks on phone: Not on file    Gets together: Not on file    Attends religious service: Not on file    Active member of club or organization: Not on file    Attends meetings of clubs or organizations: Not on file    Relationship status: Not on file  . Intimate partner violence:    Fear of current or ex partner: Not on file  Emotionally abused: Not on file    Physically abused: Not on file    Forced sexual activity: Not on file  Other Topics Concern  . Not on file  Social History Narrative  . Not on file    Current Outpatient Medications on File Prior to Visit  Medication Sig Dispense Refill  . amLODipine (NORVASC) 5 MG tablet Take 5 mg by mouth at bedtime.  11  . aspirin 81 MG tablet Take 81 mg by mouth daily.    . B Complex-C-Folic Acid (RENA-VITE PO) Take 1 tablet by mouth daily.    . Continuous Blood Gluc Receiver (FREESTYLE LIBRE 14 DAY READER) DEVI 1 Device by Does not apply route as needed. 1 Device 0  . Continuous Blood Gluc Sensor (FREESTYLE LIBRE 14 DAY SENSOR) MISC AS DIRECTED 2 each 0  . fluconazole (DIFLUCAN) 150 MG tablet TAKE 1 TABLET BY MOUTH ON DAY 1 4 AND 7  0  . folic acid  (FOLVITE) 1 MG tablet Take 1 mg by mouth daily.  0  . glucose blood (ONE TOUCH ULTRA TEST) test strip 1 each by Other route 2 (two) times daily. And lancets 2/day 100 each 12  . imiquimod (ALDARA) 5 % cream USE THREE TIMES WEEKLY AT BEDTIME. WASH OFF WITH SOAP AND WATER 6 TO 10 HOURS LATER. MAX 16 WEEKS. AVOID SEXUAL CONTACT WHILE CREAM IN ON SK  0  . levonorgestrel (MIRENA) 20 MCG/24HR IUD 1 each by Intrauterine route once.    . lidocaine-prilocaine (EMLA) cream Apply 1 application topically daily as needed (dialysis).     . mycophenolate (CELLCEPT) 250 MG capsule Take 1,000 mg by mouth 2 (two) times daily.    . polyethylene glycol (MIRALAX / GLYCOLAX) packet Take 17 g by mouth daily.    . predniSONE (DELTASONE) 5 MG tablet Take 5 mg by mouth daily with breakfast.    . ranitidine (ZANTAC) 150 MG tablet Take 150 mg by mouth 2 (two) times daily.    . sevelamer carbonate (RENVELA) 800 MG tablet Take 4,000 mg by mouth 3 (three) times daily with meals.     . tacrolimus (PROGRAF) 1 MG capsule Take 9 mg by mouth 2 (two) times daily. 5 in the morning .    . valGANciclovir (VALCYTE) 450 MG tablet Take by mouth daily. 1 tab every other day.      No current facility-administered medications on file prior to visit.     Allergies  Allergen Reactions  . Fluorescein Shortness Of Breath and Other (See Comments)    Hypertension  . Iodinated Diagnostic Agents Anaphylaxis, Hives and Other (See Comments)    Diffuse swelling  . Iohexol Anaphylaxis, Hives, Itching, Swelling and Other (See Comments)    13 HR PRE-MEDS REQUIRED  . Latex Itching    Family History  Problem Relation Age of Onset  . Diabetes Maternal Grandmother     Pulse 94   Ht 5\' 3"  (1.6 m)   Wt 184 lb 6.4 oz (83.6 kg)   SpO2 97%   BMI 32.66 kg/m    Review of Systems Denies LOC.      Objective:   Physical Exam VITAL SIGNS:  See vs page GENERAL: no distress Pulses: dorsalis pedis intact bilat.   MSK: no deformity of the  feet CV: no leg edema Skin:  no ulcer on the feet.  normal color and temp on the feet. Callus on the plantar aspect of the left foot.   Neuro: sensation is intact to touch  on the feet.   A1c=9.7%  Lab Results  Component Value Date   CREATININE 9.76 (H) 10/09/2014   BUN 38 (H) 10/09/2014   NA 137 11/25/2015   K 4.9 11/25/2015   CL 95 (L) 10/09/2014   CO2 25 10/09/2014       Assessment & Plan:  Type 2 DM, with PDR: worse.  She needs a simpler regimen. Occupational status: she needs a flat insulin level throughout the day, as she works 3rd shift ESRD: she will have a long duration of action of insulin  Patient Instructions  check your blood sugar twice a day.  vary the time of day when you check, between before the 3 meals, and at bedtime.  also check if you have symptoms of your blood sugar being too high or too low.  please keep a record of the readings and bring it to your next appointment here (or you can bring the meter itself).  You can write it on any piece of paper.  please call us sooner if your blood sugar goes below 70, or if you have a lot of readings over 200.   Please change the insulins to "Lantus," 40 units daily.  I have sent a prescription to your pharmacy On this type of insulin schedule, you should eat meals on a regular schedule.  If a meal is missed or significantly delayed, your blood sugar could go low.  Please come back for a follow-up appointment in 2 months.

## 2018-03-28 NOTE — Patient Instructions (Addendum)
check your blood sugar twice a day.  vary the time of day when you check, between before the 3 meals, and at bedtime.  also check if you have symptoms of your blood sugar being too high or too low.  please keep a record of the readings and bring it to your next appointment here (or you can bring the meter itself).  You can write it on any piece of paper.  please call us sooner if your blood sugar goes below 70, or if you have a lot of readings over 200.   Please change the insulins to "Lantus," 40 units daily.  I have sent a prescription to your pharmacy On this type of insulin schedule, you should eat meals on a regular schedule.  If a meal is missed or significantly delayed, your blood sugar could go low.  Please come back for a follow-up appointment in 2 months.

## 2018-04-03 DIAGNOSIS — E1142 Type 2 diabetes mellitus with diabetic polyneuropathy: Secondary | ICD-10-CM | POA: Diagnosis not present

## 2018-04-03 DIAGNOSIS — G5603 Carpal tunnel syndrome, bilateral upper limbs: Secondary | ICD-10-CM | POA: Diagnosis not present

## 2018-04-23 DIAGNOSIS — E1142 Type 2 diabetes mellitus with diabetic polyneuropathy: Secondary | ICD-10-CM | POA: Diagnosis not present

## 2018-04-23 DIAGNOSIS — G5603 Carpal tunnel syndrome, bilateral upper limbs: Secondary | ICD-10-CM | POA: Diagnosis not present

## 2018-04-23 DIAGNOSIS — R202 Paresthesia of skin: Secondary | ICD-10-CM | POA: Diagnosis not present

## 2018-04-28 DIAGNOSIS — N903 Dysplasia of vulva, unspecified: Secondary | ICD-10-CM | POA: Diagnosis not present

## 2018-04-28 DIAGNOSIS — K625 Hemorrhage of anus and rectum: Secondary | ICD-10-CM | POA: Diagnosis not present

## 2018-04-28 DIAGNOSIS — D071 Carcinoma in situ of vulva: Secondary | ICD-10-CM | POA: Diagnosis not present

## 2018-04-28 DIAGNOSIS — A630 Anogenital (venereal) warts: Secondary | ICD-10-CM | POA: Diagnosis not present

## 2018-05-06 ENCOUNTER — Telehealth: Payer: Self-pay | Admitting: Endocrinology

## 2018-05-06 DIAGNOSIS — N870 Mild cervical dysplasia: Secondary | ICD-10-CM | POA: Diagnosis not present

## 2018-05-06 NOTE — Telephone Encounter (Signed)
Pt is set up for tomorrow

## 2018-05-06 NOTE — Telephone Encounter (Signed)
Please advise ov this week

## 2018-05-06 NOTE — Telephone Encounter (Signed)
Patient requests to be called at ph# [phone] in regards to upcoming surgery scheduled for 05/19/18 and adjusting medication for said surgery.

## 2018-05-06 NOTE — Telephone Encounter (Signed)
Called pt and LVM requesting returned call to schedule appt

## 2018-05-06 NOTE — Telephone Encounter (Signed)
Pt currently taking Lantus 40 units daily. Please advise about dosage prior to surgery

## 2018-05-07 ENCOUNTER — Telehealth: Payer: Self-pay | Admitting: Endocrinology

## 2018-05-07 ENCOUNTER — Ambulatory Visit: Payer: Self-pay | Admitting: Endocrinology

## 2018-05-07 NOTE — Telephone Encounter (Signed)
Refer to Dr. Cordelia Pen response

## 2018-05-07 NOTE — Telephone Encounter (Signed)
Patient no showed today's appt. Please advise on how to follow up. °A. No follow up necessary. °B. Follow up urgent. Contact patient immediately. °C. Follow up necessary. Contact patient and schedule visit in ___ days. °D. Follow up advised. Contact patient and schedule visit in ____weeks. ° °Would you like the NS fee to be applied to this visit? ° °

## 2018-05-07 NOTE — Telephone Encounter (Signed)
Please schedule f/u appt for next available appointment  

## 2018-05-08 ENCOUNTER — Ambulatory Visit (INDEPENDENT_AMBULATORY_CARE_PROVIDER_SITE_OTHER): Payer: BLUE CROSS/BLUE SHIELD | Admitting: Endocrinology

## 2018-05-08 ENCOUNTER — Other Ambulatory Visit: Payer: Self-pay

## 2018-05-08 ENCOUNTER — Encounter: Payer: Self-pay | Admitting: Endocrinology

## 2018-05-08 VITALS — BP 104/68 | HR 93 | Ht 63.0 in | Wt 181.8 lb

## 2018-05-08 DIAGNOSIS — Z992 Dependence on renal dialysis: Secondary | ICD-10-CM

## 2018-05-08 DIAGNOSIS — E1122 Type 2 diabetes mellitus with diabetic chronic kidney disease: Secondary | ICD-10-CM

## 2018-05-08 DIAGNOSIS — Z794 Long term (current) use of insulin: Secondary | ICD-10-CM | POA: Diagnosis not present

## 2018-05-08 DIAGNOSIS — N186 End stage renal disease: Secondary | ICD-10-CM | POA: Diagnosis not present

## 2018-05-08 LAB — POCT GLYCOSYLATED HEMOGLOBIN (HGB A1C): Hemoglobin A1C: 11.5 % — AB (ref 4.0–5.6)

## 2018-05-08 NOTE — Progress Notes (Signed)
Subjective:    Patient ID: Morgan [name], female    DOB: 07/18/1970, 48 y.o.   MRN: [id number]  HPI Pt returns for f/u of diabetes mellitus: DM type: Insulin-requiring type 2 Dx'ed: 1998.  Complications: polyneuropathy, proliferative retinopathy, and ESRD.   Therapy: insulin since dx.   GDM: never.  DKA: never.  Severe hypoglycemia: never.  Pancreatitis: never.   Other: She took an intentional insulin overdose in 2014, in a suicide attempt; therapy has been limited by noncompliance, so she is on a qd insulin schedule; she had a renal transplant in 2018.   Interval history: she now works 3rd shift.  Pt says she sometimes misses the insulin.  no cbg record, but states cbg's vary from 42-400's.   Past Medical History:  Diagnosis Date  . Anemia    low iron  . Constipation    when given iron  . Diabetes mellitus without complication ([place])    type 2  . Diabetic retinopathy ([place])   . H/O detached retina repair    bilateral  . Hypertension   . Pneumonia   . Renal disorder    dialysis on T/Th/Sa    Past Surgical History:  Procedure Laterality Date  . ARTERIOVENOUS GRAFT PLACEMENT Left 07/2010  . AV FISTULA PLACEMENT Right 11/25/2015   Procedure: INSERTION OF ARTERIOVENOUS (AV) GORE-TEX GRAFT RIGHT  ARM USING 4-7 MM X 45 CM STRETCH GRAFT.;  Surgeon: Angelia Mould, MD;  Location: [facility] OR;  Service: Vascular;  Laterality: Right;  . basilic vein transposition Left 2011  . CESAREAN SECTION    . CHOLECYSTECTOMY N/A 10/08/2014   Procedure: LAPAROSCOPIC CHOLECYSTECTOMY ;  Surgeon: Erroll Luna, MD;  Location: [place];  Service: General;  Laterality: N/A;  . EYE SURGERY     retinal detachment  . REVISION OF ARTERIOVENOUS GORETEX GRAFT Left 05/13/2015   Procedure: REVISION OF ARTERIOVENOUS GORETEX GRAFT;  Surgeon: Angelia Mould, MD;  Location: [place];  Service: Vascular;  Laterality: Left;    Social History   Socioeconomic History  . Marital status: Married   Spouse name: Not on file  . Number of children: Not on file  . Years of education: Not on file  . Highest education level: Not on file  Occupational History  . Not on file  Social Needs  . Financial resource strain: Not on file  . Food insecurity:    Worry: Not on file    Inability: Not on file  . Transportation needs:    Medical: Not on file    Non-medical: Not on file  Tobacco Use  . Smoking status: Never Smoker  . Smokeless tobacco: Never Used  Substance and Sexual Activity  . Alcohol use: No    Alcohol/week: 0.0 standard drinks  . Drug use: No  . Sexual activity: Not on file  Lifestyle  . Physical activity:    Days per week: Not on file    Minutes per session: Not on file  . Stress: Not on file  Relationships  . Social connections:    Talks on phone: Not on file    Gets together: Not on file    Attends religious service: Not on file    Active member of club or organization: Not on file    Attends meetings of clubs or organizations: Not on file    Relationship status: Not on file  . Intimate partner violence:    Fear of current or ex partner: Not on file    Emotionally  abused: Not on file    Physically abused: Not on file    Forced sexual activity: Not on file  Other Topics Concern  . Not on file  Social History Narrative  . Not on file    Current Outpatient Medications on File Prior to Visit  Medication Sig Dispense Refill  . amLODipine (NORVASC) 5 MG tablet Take 5 mg by mouth at bedtime.  11  . aspirin 81 MG tablet Take 81 mg by mouth daily.    . B Complex-C-Folic Acid (RENA-VITE PO) Take 1 tablet by mouth daily.    . Continuous Blood Gluc Receiver (FREESTYLE LIBRE 14 DAY READER) DEVI 1 Device by Does not apply route as needed. 1 Device 0  . Continuous Blood Gluc Sensor (FREESTYLE LIBRE 14 DAY SENSOR) MISC AS DIRECTED 2 each 0  . fluconazole (DIFLUCAN) 150 MG tablet TAKE 1 TABLET BY MOUTH ON DAY 1 4 AND 7  0  . folic acid (FOLVITE) 1 MG tablet Take 1 mg by  mouth daily.  0  . glucose blood (ONE TOUCH ULTRA TEST) test strip 1 each by Other route 2 (two) times daily. And lancets 2/day 100 each 12  . imiquimod (ALDARA) 5 % cream USE THREE TIMES WEEKLY AT BEDTIME. WASH OFF WITH SOAP AND WATER 6 TO 10 HOURS LATER. MAX 16 WEEKS. AVOID SEXUAL CONTACT WHILE CREAM IN ON SK  0  . Insulin Glargine (LANTUS SOLOSTAR) 100 UNIT/ML Solostar Pen Inject 40 Units into the skin daily. And pen needles 1/day (Patient taking differently: Inject 40 Units into the skin daily. ) 5 pen PRN  . levonorgestrel (MIRENA) 20 MCG/24HR IUD 1 each by Intrauterine route once.    . lidocaine-prilocaine (EMLA) cream Apply 1 application topically daily as needed (dialysis).     . mycophenolate (CELLCEPT) 250 MG capsule Take 1,000 mg by mouth 2 (two) times daily.    . polyethylene glycol (MIRALAX / GLYCOLAX) packet Take 17 g by mouth daily.    . predniSONE (DELTASONE) 5 MG tablet Take 5 mg by mouth daily with breakfast.    . ranitidine (ZANTAC) 150 MG tablet Take 150 mg by mouth 2 (two) times daily.    . sevelamer carbonate (RENVELA) 800 MG tablet Take 4,000 mg by mouth 3 (three) times daily with meals.     . tacrolimus (PROGRAF) 1 MG capsule Take 9 mg by mouth 2 (two) times daily. 5 in the morning .    . valGANciclovir (VALCYTE) 450 MG tablet Take by mouth daily. 1 tab every other day.      No current facility-administered medications on file prior to visit.     Allergies  Allergen Reactions  . Fluorescein Shortness Of Breath and Other (See Comments)    Hypertension  . Iodinated Diagnostic Agents Anaphylaxis, Hives and Other (See Comments)    Diffuse swelling  . Iohexol Anaphylaxis, Hives, Itching, Swelling and Other (See Comments)    13 HR PRE-MEDS REQUIRED  . Latex Itching    Family History  Problem Relation Age of Onset  . Diabetes Maternal Grandmother     BP 104/68 (BP Location: Right Leg, Patient Position: Supine, Cuff Size: Normal)   Pulse 93   Ht 5\' 3"  (1.6 m)   Wt  181 lb 12.8 oz (82.5 kg)   SpO2 90%   BMI 32.20 kg/m    Review of Systems Denies LOC    Objective:   Physical Exam VITAL SIGNS:  See vs page GENERAL: no distress Pulses: dorsalis pedis  intact bilat.   MSK: no deformity of the feet CV: no leg edema Skin:  no ulcer on the feet.  normal color and temp on the feet. Neuro: sensation is intact to touch on the feet  Lab Results  Component Value Date   HGBA1C 11.5 (A) 05/08/2018      Assessment & Plan:  Insulin-requiring type 2 DM, with PDR: poor glycemic control. Hypoglycemia: This limits aggressiveness of glycemic control Noncompliance with cbg recording and insulin: we discussed.  She says she takes insulin as rx'ed  Patient Instructions  check your blood sugar twice a day.  vary the time of day when you check, between before the 3 meals, and at bedtime.  also check if you have symptoms of your blood sugar being too high or too low.  please keep a record of the readings and bring it to your next appointment here (or you can bring the meter itself).  You can write it on any piece of paper.  please call us sooner if your blood sugar goes below 70, or if you have a lot of readings over 200.   To help you remember the insulin, put the pen next to something you use each day, such as your toothbrush.  On this type of insulin schedule, you should eat meals on a regular schedule.  If a meal is missed or significantly delayed, your blood sugar could go low.  wh you go to a liquid diet, decrease to 10 units daily. On days you do not eat, skip the insulin altogether Please come back for a follow-up appointment in 2 months.

## 2018-05-08 NOTE — Patient Instructions (Addendum)
check your blood sugar twice a day.  vary the time of day when you check, between before the 3 meals, and at bedtime.  also check if you have symptoms of your blood sugar being too high or too low.  please keep a record of the readings and bring it to your next appointment here (or you can bring the meter itself).  You can write it on any piece of paper.  please call us sooner if your blood sugar goes below 70, or if you have a lot of readings over 200.   To help you remember the insulin, put the pen next to something you use each day, such as your toothbrush.  On this type of insulin schedule, you should eat meals on a regular schedule.  If a meal is missed or significantly delayed, your blood sugar could go low.  wh you go to a liquid diet, decrease to 10 units daily. On days you do not eat, skip the insulin altogether Please come back for a follow-up appointment in 2 months.

## 2018-05-08 NOTE — Telephone Encounter (Signed)
LMTCB to reschedule missed appointment °

## 2018-05-12 ENCOUNTER — Telehealth: Payer: Self-pay | Admitting: Endocrinology

## 2018-05-12 NOTE — Telephone Encounter (Signed)
Did you offer this card?

## 2018-05-12 NOTE — Telephone Encounter (Signed)
Patient Is needing the Lantus Cop[place] card.  She stated that she was offered this card at her last visit but wanted to wait to see what the cost would be  Please advise

## 2018-05-12 NOTE — Telephone Encounter (Signed)
Yes, she can have a card.  She may find it easier to go to [email or web address].

## 2018-05-13 DIAGNOSIS — N2581 Secondary hyperparathyroidism of renal origin: Secondary | ICD-10-CM | POA: Diagnosis not present

## 2018-05-13 DIAGNOSIS — Z940 Kidney transplant status: Secondary | ICD-10-CM | POA: Diagnosis not present

## 2018-05-13 DIAGNOSIS — N189 Chronic kidney disease, unspecified: Secondary | ICD-10-CM | POA: Diagnosis not present

## 2018-05-13 DIAGNOSIS — D631 Anemia in chronic kidney disease: Secondary | ICD-10-CM | POA: Diagnosis not present

## 2018-05-15 ENCOUNTER — Encounter: Payer: Self-pay | Admitting: Podiatry

## 2018-05-15 ENCOUNTER — Ambulatory Visit (INDEPENDENT_AMBULATORY_CARE_PROVIDER_SITE_OTHER): Payer: BLUE CROSS/BLUE SHIELD | Admitting: Podiatry

## 2018-05-15 ENCOUNTER — Other Ambulatory Visit: Payer: Self-pay

## 2018-05-15 DIAGNOSIS — M722 Plantar fascial fibromatosis: Secondary | ICD-10-CM

## 2018-05-15 NOTE — Progress Notes (Signed)
She presents today for follow-up of her plantar fasciitis states that the heel is really been hurting me.  Objective: Vital signs are stable alert oriented x3.  Pulses are palpable.  She has pain on palpation mid calcaneal tubercle bilateral heels.  Assessment: Plantar fasciitis bilateral.  Plan: We injected bilateral heels today 20 mg Kenalog 5 mg Marcaine for maximal tenderness.  Tolerated seizure well follow-up with her in a month.

## 2018-05-20 ENCOUNTER — Ambulatory Visit: Payer: BLUE CROSS/BLUE SHIELD | Admitting: Podiatry

## 2018-05-27 ENCOUNTER — Ambulatory Visit: Payer: Self-pay | Admitting: Endocrinology

## 2018-06-08 DIAGNOSIS — Z01818 Encounter for other preprocedural examination: Secondary | ICD-10-CM | POA: Diagnosis not present

## 2018-06-08 DIAGNOSIS — Z1159 Encounter for screening for other viral diseases: Secondary | ICD-10-CM | POA: Diagnosis not present

## 2018-06-11 DIAGNOSIS — Z91040 Latex allergy status: Secondary | ICD-10-CM | POA: Diagnosis not present

## 2018-06-11 DIAGNOSIS — Z794 Long term (current) use of insulin: Secondary | ICD-10-CM | POA: Diagnosis not present

## 2018-06-11 DIAGNOSIS — K625 Hemorrhage of anus and rectum: Secondary | ICD-10-CM | POA: Diagnosis not present

## 2018-06-11 DIAGNOSIS — E1122 Type 2 diabetes mellitus with diabetic chronic kidney disease: Secondary | ICD-10-CM | POA: Diagnosis not present

## 2018-06-11 DIAGNOSIS — Z87442 Personal history of urinary calculi: Secondary | ICD-10-CM | POA: Diagnosis not present

## 2018-06-11 DIAGNOSIS — I120 Hypertensive chronic kidney disease with stage 5 chronic kidney disease or end stage renal disease: Secondary | ICD-10-CM | POA: Diagnosis not present

## 2018-06-11 DIAGNOSIS — K5730 Diverticulosis of large intestine without perforation or abscess without bleeding: Secondary | ICD-10-CM | POA: Diagnosis not present

## 2018-06-11 DIAGNOSIS — E113599 Type 2 diabetes mellitus with proliferative diabetic retinopathy without macular edema, unspecified eye: Secondary | ICD-10-CM | POA: Diagnosis not present

## 2018-06-11 DIAGNOSIS — Z7982 Long term (current) use of aspirin: Secondary | ICD-10-CM | POA: Diagnosis not present

## 2018-06-11 DIAGNOSIS — Z8049 Family history of malignant neoplasm of other genital organs: Secondary | ICD-10-CM | POA: Diagnosis not present

## 2018-06-11 DIAGNOSIS — Z79899 Other long term (current) drug therapy: Secondary | ICD-10-CM | POA: Diagnosis not present

## 2018-06-11 DIAGNOSIS — Z992 Dependence on renal dialysis: Secondary | ICD-10-CM | POA: Diagnosis not present

## 2018-06-11 DIAGNOSIS — K644 Residual hemorrhoidal skin tags: Secondary | ICD-10-CM | POA: Diagnosis not present

## 2018-06-11 DIAGNOSIS — K921 Melena: Secondary | ICD-10-CM | POA: Diagnosis not present

## 2018-06-11 DIAGNOSIS — N186 End stage renal disease: Secondary | ICD-10-CM | POA: Diagnosis not present

## 2018-06-27 ENCOUNTER — Telehealth: Payer: Self-pay

## 2018-06-27 NOTE — Telephone Encounter (Signed)
LOV 05/08/18. Per Dr. Loanne Drilling, f/u in 2 months. Current appt 08/06/18. Called pt to reschedule to a sooner appt. LVM requesting returned call.

## 2018-07-01 DIAGNOSIS — A630 Anogenital (venereal) warts: Secondary | ICD-10-CM | POA: Diagnosis not present

## 2018-07-14 DIAGNOSIS — D899 Disorder involving the immune mechanism, unspecified: Secondary | ICD-10-CM | POA: Diagnosis not present

## 2018-07-14 DIAGNOSIS — N903 Dysplasia of vulva, unspecified: Secondary | ICD-10-CM | POA: Diagnosis not present

## 2018-07-14 DIAGNOSIS — Z01818 Encounter for other preprocedural examination: Secondary | ICD-10-CM | POA: Diagnosis not present

## 2018-07-14 DIAGNOSIS — D071 Carcinoma in situ of vulva: Secondary | ICD-10-CM | POA: Diagnosis not present

## 2018-07-15 ENCOUNTER — Ambulatory Visit: Payer: Medicare Other

## 2018-07-15 ENCOUNTER — Encounter: Payer: Medicare Other | Admitting: Podiatry

## 2018-07-15 DIAGNOSIS — M778 Other enthesopathies, not elsewhere classified: Secondary | ICD-10-CM

## 2018-07-15 NOTE — Progress Notes (Signed)
This encounter was created in error - please disregard.

## 2018-07-16 ENCOUNTER — Encounter: Payer: Self-pay | Admitting: Endocrinology

## 2018-07-17 ENCOUNTER — Encounter: Payer: Self-pay | Admitting: Endocrinology

## 2018-07-17 ENCOUNTER — Other Ambulatory Visit: Payer: Self-pay

## 2018-07-17 ENCOUNTER — Ambulatory Visit (INDEPENDENT_AMBULATORY_CARE_PROVIDER_SITE_OTHER): Payer: BLUE CROSS/BLUE SHIELD | Admitting: Endocrinology

## 2018-07-17 VITALS — BP 110/68 | HR 104 | Temp 98.0°F | Wt 185.0 lb

## 2018-07-17 DIAGNOSIS — E1122 Type 2 diabetes mellitus with diabetic chronic kidney disease: Secondary | ICD-10-CM

## 2018-07-17 DIAGNOSIS — N186 End stage renal disease: Secondary | ICD-10-CM

## 2018-07-17 DIAGNOSIS — Z992 Dependence on renal dialysis: Secondary | ICD-10-CM

## 2018-07-17 DIAGNOSIS — Z794 Long term (current) use of insulin: Secondary | ICD-10-CM

## 2018-07-17 LAB — POCT GLYCOSYLATED HEMOGLOBIN (HGB A1C): Hemoglobin A1C: 10.4 % — AB (ref 4.0–5.6)

## 2018-07-17 MED ORDER — FREESTYLE LIBRE 14 DAY READER DEVI: 1.0000 | 0 refills | Status: DC | PRN

## 2018-07-17 MED ORDER — FREESTYLE LIBRE 14 DAY SENSOR MISC: 1.0000 | 3 refills | Status: DC

## 2018-07-17 NOTE — Progress Notes (Signed)
   Subjective:    Patient ID: Morgan [name], female    DOB: 1970-12-05, 48 y.o.   MRN: [id number]  HPI Pt returns for f/u of diabetes mellitus: DM type: Insulin-requiring type 2 Dx'ed: 1998.  Complications: polyneuropathy, proliferative retinopathy, and ESRD.   Therapy: insulin since dx.   GDM: never.  DKA: never.  Severe hypoglycemia: never.  Pancreatitis: never.   Other: She took an intentional insulin overdose in 2014, in a suicide attempt; therapy has been limited by noncompliance, so she is on a qd insulin schedule; she had a renal transplant in 2018.   Interval history: She takes lantus 40 units qd (but she misses 3 times per week), and PRN reg (averages 15 units/day).  she brings her meter her cbg's which I have reviewed today.  It varies from 53-212.     Review of Systems Denies LOC    Objective:   Physical Exam VITAL SIGNS:  See vs page GENERAL: no distress Pulses: dorsalis pedis intact bilat.   MSK: no deformity of the feet CV: no leg edema Skin:  no ulcer on the feet.  normal color and temp on the feet.   Neuro: sensation is intact to touch on the feet.     Lab Results  Component Value Date   CREATININE 9.76 (H) 10/09/2014   BUN 38 (H) 10/09/2014   NA 137 11/25/2015   K 4.9 11/25/2015   CL 95 (L) 10/09/2014   CO2 25 10/09/2014   Lab Results  Component Value Date   HGBA1C 10.4 (A) 07/17/2018       Assessment & Plan:  Insulin-requiring type 2 DM, with PDR: poor glycemic control Renal failure: in this context, she is at risk for hypoglycemia between meals.   Noncompliance with cbg recording and insulin: improving this is the next step in her DM rx. she is not a candidate for multiple daily injections.  Hypoglycemia: This limits aggressiveness of glycemic control.     Patient Instructions  check your blood sugar twice a day.  vary the time of day when you check, between before the 3 meals, and at bedtime.  also check if you have symptoms of  your blood sugar being too high or too low.  please keep a record of the readings and bring it to your next appointment here (or you can bring the meter itself).  You can write it on any piece of paper.  please call us sooner if your blood sugar goes below 70, or if you have a lot of readings over 200.   To help you remember the insulin, put the pen next to something you use each day, such as your toothbrush.  This is the next step in your diabetes therapy. On this type of insulin schedule, you should eat meals on a regular schedule.  If a meal is missed or significantly delayed, your blood sugar could go low.   Please come back for a follow-up appointment in 2 months.

## 2018-07-17 NOTE — Patient Instructions (Addendum)
check your blood sugar twice a day.  vary the time of day when you check, between before the 3 meals, and at bedtime.  also check if you have symptoms of your blood sugar being too high or too low.  please keep a record of the readings and bring it to your next appointment here (or you can bring the meter itself).  You can write it on any piece of paper.  please call us sooner if your blood sugar goes below 70, or if you have a lot of readings over 200.   To help you remember the insulin, put the pen next to something you use each day, such as your toothbrush.  This is the next step in your diabetes therapy. On this type of insulin schedule, you should eat meals on a regular schedule.  If a meal is missed or significantly delayed, your blood sugar could go low.   Please come back for a follow-up appointment in 2 months.

## 2018-07-23 DIAGNOSIS — Z01818 Encounter for other preprocedural examination: Secondary | ICD-10-CM | POA: Diagnosis not present

## 2018-07-23 DIAGNOSIS — T8619 Other complication of kidney transplant: Secondary | ICD-10-CM | POA: Diagnosis not present

## 2018-07-23 DIAGNOSIS — E1165 Type 2 diabetes mellitus with hyperglycemia: Secondary | ICD-10-CM | POA: Diagnosis not present

## 2018-07-23 DIAGNOSIS — D702 Other drug-induced agranulocytosis: Secondary | ICD-10-CM | POA: Diagnosis not present

## 2018-07-23 DIAGNOSIS — R9431 Abnormal electrocardiogram [ECG] [EKG]: Secondary | ICD-10-CM | POA: Diagnosis not present

## 2018-07-23 DIAGNOSIS — D071 Carcinoma in situ of vulva: Secondary | ICD-10-CM | POA: Diagnosis not present

## 2018-07-23 DIAGNOSIS — Z992 Dependence on renal dialysis: Secondary | ICD-10-CM | POA: Diagnosis not present

## 2018-07-23 DIAGNOSIS — D72819 Decreased white blood cell count, unspecified: Secondary | ICD-10-CM | POA: Diagnosis not present

## 2018-07-23 DIAGNOSIS — N2581 Secondary hyperparathyroidism of renal origin: Secondary | ICD-10-CM | POA: Diagnosis not present

## 2018-07-23 DIAGNOSIS — N186 End stage renal disease: Secondary | ICD-10-CM | POA: Diagnosis not present

## 2018-07-23 DIAGNOSIS — E083523 Diabetes mellitus due to underlying condition with proliferative diabetic retinopathy with traction retinal detachment involving the macula, bilateral: Secondary | ICD-10-CM | POA: Diagnosis not present

## 2018-07-23 DIAGNOSIS — N903 Dysplasia of vulva, unspecified: Secondary | ICD-10-CM | POA: Diagnosis not present

## 2018-07-23 DIAGNOSIS — I120 Hypertensive chronic kidney disease with stage 5 chronic kidney disease or end stage renal disease: Secondary | ICD-10-CM | POA: Diagnosis not present

## 2018-07-23 DIAGNOSIS — E1122 Type 2 diabetes mellitus with diabetic chronic kidney disease: Secondary | ICD-10-CM | POA: Diagnosis not present

## 2018-07-23 DIAGNOSIS — I517 Cardiomegaly: Secondary | ICD-10-CM | POA: Diagnosis not present

## 2018-07-23 DIAGNOSIS — K219 Gastro-esophageal reflux disease without esophagitis: Secondary | ICD-10-CM | POA: Diagnosis not present

## 2018-07-28 ENCOUNTER — Telehealth: Payer: Self-pay | Admitting: Endocrinology

## 2018-07-28 DIAGNOSIS — Z23 Encounter for immunization: Secondary | ICD-10-CM | POA: Diagnosis not present

## 2018-07-28 DIAGNOSIS — Z940 Kidney transplant status: Secondary | ICD-10-CM | POA: Diagnosis not present

## 2018-07-28 DIAGNOSIS — I120 Hypertensive chronic kidney disease with stage 5 chronic kidney disease or end stage renal disease: Secondary | ICD-10-CM | POA: Diagnosis not present

## 2018-07-28 DIAGNOSIS — E1165 Type 2 diabetes mellitus with hyperglycemia: Secondary | ICD-10-CM | POA: Diagnosis not present

## 2018-07-28 DIAGNOSIS — Z992 Dependence on renal dialysis: Secondary | ICD-10-CM | POA: Diagnosis not present

## 2018-07-28 DIAGNOSIS — L659 Nonscarring hair loss, unspecified: Secondary | ICD-10-CM | POA: Diagnosis not present

## 2018-07-28 DIAGNOSIS — N186 End stage renal disease: Secondary | ICD-10-CM | POA: Diagnosis not present

## 2018-07-28 DIAGNOSIS — E1122 Type 2 diabetes mellitus with diabetic chronic kidney disease: Secondary | ICD-10-CM | POA: Diagnosis not present

## 2018-07-28 DIAGNOSIS — Z79899 Other long term (current) drug therapy: Secondary | ICD-10-CM | POA: Diagnosis not present

## 2018-07-28 DIAGNOSIS — Z01818 Encounter for other preprocedural examination: Secondary | ICD-10-CM | POA: Diagnosis not present

## 2018-07-28 DIAGNOSIS — Z1159 Encounter for screening for other viral diseases: Secondary | ICD-10-CM | POA: Diagnosis not present

## 2018-07-28 NOTE — Telephone Encounter (Signed)
It is a cgm continuous glucose monitor  The company is [employer] for the freestyle libre

## 2018-07-28 NOTE — Telephone Encounter (Signed)
Please refer to message below.

## 2018-07-28 NOTE — Telephone Encounter (Signed)
DME company needs most recent office visit notes to support the [employer] request  [phone]  Fax: [phone]

## 2018-07-28 NOTE — Telephone Encounter (Signed)
No DME company name listed [phone] is a none working number [phone] is the incorrect number What is a CSG?

## 2018-07-30 DIAGNOSIS — Z79899 Other long term (current) drug therapy: Secondary | ICD-10-CM | POA: Diagnosis not present

## 2018-07-30 DIAGNOSIS — I120 Hypertensive chronic kidney disease with stage 5 chronic kidney disease or end stage renal disease: Secondary | ICD-10-CM | POA: Diagnosis not present

## 2018-07-30 DIAGNOSIS — Z940 Kidney transplant status: Secondary | ICD-10-CM | POA: Diagnosis not present

## 2018-07-30 DIAGNOSIS — Z992 Dependence on renal dialysis: Secondary | ICD-10-CM | POA: Diagnosis not present

## 2018-07-30 DIAGNOSIS — D013 Carcinoma in situ of anus and anal canal: Secondary | ICD-10-CM | POA: Diagnosis not present

## 2018-07-30 DIAGNOSIS — Z794 Long term (current) use of insulin: Secondary | ICD-10-CM | POA: Diagnosis not present

## 2018-07-30 DIAGNOSIS — Z87442 Personal history of urinary calculi: Secondary | ICD-10-CM | POA: Diagnosis not present

## 2018-07-30 DIAGNOSIS — Z87412 Personal history of vulvar dysplasia: Secondary | ICD-10-CM | POA: Diagnosis not present

## 2018-07-30 DIAGNOSIS — A630 Anogenital (venereal) warts: Secondary | ICD-10-CM | POA: Diagnosis not present

## 2018-07-30 DIAGNOSIS — N186 End stage renal disease: Secondary | ICD-10-CM | POA: Diagnosis not present

## 2018-07-30 DIAGNOSIS — D071 Carcinoma in situ of vulva: Secondary | ICD-10-CM | POA: Diagnosis not present

## 2018-07-30 DIAGNOSIS — E1165 Type 2 diabetes mellitus with hyperglycemia: Secondary | ICD-10-CM | POA: Diagnosis not present

## 2018-07-30 DIAGNOSIS — E7800 Pure hypercholesterolemia, unspecified: Secondary | ICD-10-CM | POA: Diagnosis not present

## 2018-07-30 DIAGNOSIS — K219 Gastro-esophageal reflux disease without esophagitis: Secondary | ICD-10-CM | POA: Diagnosis not present

## 2018-07-30 DIAGNOSIS — Z7982 Long term (current) use of aspirin: Secondary | ICD-10-CM | POA: Diagnosis not present

## 2018-07-30 DIAGNOSIS — E213 Hyperparathyroidism, unspecified: Secondary | ICD-10-CM | POA: Diagnosis not present

## 2018-07-30 DIAGNOSIS — E1122 Type 2 diabetes mellitus with diabetic chronic kidney disease: Secondary | ICD-10-CM | POA: Diagnosis not present

## 2018-07-31 DIAGNOSIS — E1165 Type 2 diabetes mellitus with hyperglycemia: Secondary | ICD-10-CM | POA: Diagnosis not present

## 2018-07-31 DIAGNOSIS — Z87442 Personal history of urinary calculi: Secondary | ICD-10-CM | POA: Diagnosis not present

## 2018-07-31 DIAGNOSIS — K219 Gastro-esophageal reflux disease without esophagitis: Secondary | ICD-10-CM | POA: Diagnosis not present

## 2018-07-31 DIAGNOSIS — Z79899 Other long term (current) drug therapy: Secondary | ICD-10-CM | POA: Diagnosis not present

## 2018-07-31 DIAGNOSIS — E7800 Pure hypercholesterolemia, unspecified: Secondary | ICD-10-CM | POA: Diagnosis not present

## 2018-07-31 DIAGNOSIS — Z992 Dependence on renal dialysis: Secondary | ICD-10-CM | POA: Diagnosis not present

## 2018-07-31 DIAGNOSIS — E213 Hyperparathyroidism, unspecified: Secondary | ICD-10-CM | POA: Diagnosis not present

## 2018-07-31 DIAGNOSIS — A630 Anogenital (venereal) warts: Secondary | ICD-10-CM | POA: Diagnosis not present

## 2018-07-31 DIAGNOSIS — D071 Carcinoma in situ of vulva: Secondary | ICD-10-CM | POA: Diagnosis not present

## 2018-07-31 DIAGNOSIS — Z794 Long term (current) use of insulin: Secondary | ICD-10-CM | POA: Diagnosis not present

## 2018-07-31 DIAGNOSIS — I120 Hypertensive chronic kidney disease with stage 5 chronic kidney disease or end stage renal disease: Secondary | ICD-10-CM | POA: Diagnosis not present

## 2018-07-31 DIAGNOSIS — Z7982 Long term (current) use of aspirin: Secondary | ICD-10-CM | POA: Diagnosis not present

## 2018-07-31 DIAGNOSIS — N186 End stage renal disease: Secondary | ICD-10-CM | POA: Diagnosis not present

## 2018-07-31 DIAGNOSIS — Z940 Kidney transplant status: Secondary | ICD-10-CM | POA: Diagnosis not present

## 2018-07-31 DIAGNOSIS — Z87412 Personal history of vulvar dysplasia: Secondary | ICD-10-CM | POA: Diagnosis not present

## 2018-07-31 DIAGNOSIS — E1122 Type 2 diabetes mellitus with diabetic chronic kidney disease: Secondary | ICD-10-CM | POA: Diagnosis not present

## 2018-08-06 ENCOUNTER — Ambulatory Visit: Payer: Self-pay | Admitting: Endocrinology

## 2018-08-25 DIAGNOSIS — D071 Carcinoma in situ of vulva: Secondary | ICD-10-CM | POA: Diagnosis not present

## 2018-09-02 DIAGNOSIS — A630 Anogenital (venereal) warts: Secondary | ICD-10-CM | POA: Diagnosis not present

## 2018-09-03 ENCOUNTER — Telehealth: Payer: Self-pay | Admitting: Endocrinology

## 2018-09-03 NOTE — Telephone Encounter (Signed)
All requested and required documents have been faxed successfully to [employer] on 07/08/18, 07/29/18 and 08/20/18.

## 2018-09-03 NOTE — Telephone Encounter (Signed)
Patient has called stating we were supposed to be contacting us Med to cover her FreeStyle [employer] and [employer]. She states she has not heard from our office.  Please contact patient with updates, thanks

## 2018-09-05 ENCOUNTER — Telehealth: Payer: Self-pay | Admitting: Endocrinology

## 2018-09-05 NOTE — Telephone Encounter (Signed)
Patient stated that [employer] told patient to call Dr. Loanne Drilling re: [employer] have never received the forms required for the [facility] (as of 09/04/18).  Patient would really like to get the above for free. Patient's ph# [phone].

## 2018-09-05 NOTE — Telephone Encounter (Signed)
All requested and required documents have been faxed successfully to [facility] on 07/08/18, 07/29/18 and 08/20/18. Once again, all documents have been faxed today with a fax cover indicating URGENT; EXPEDITE. Confirmation received. Documents and fax confirmations have been placed in the faxed file for future reference should there still be an issue with this pt's CGM request.

## 2018-09-29 ENCOUNTER — Other Ambulatory Visit: Payer: Self-pay | Admitting: Endocrinology

## 2018-09-29 ENCOUNTER — Telehealth: Payer: Self-pay

## 2018-09-29 ENCOUNTER — Other Ambulatory Visit: Payer: Self-pay

## 2018-09-29 MED ORDER — "BD INSULIN SYRINGE 25G X 5/8"" 1 ML MISC": 3 refills | Status: DC

## 2018-09-29 NOTE — Telephone Encounter (Signed)
Please refill x 1 F/u is due  

## 2018-09-29 NOTE — Telephone Encounter (Signed)
error 

## 2018-09-29 NOTE — Telephone Encounter (Signed)
LOV 07/17/18. Per your note, pt was to schedule follow up in 81mo. No future appts noted. Please advise how you wish to proceed

## 2018-10-18 ENCOUNTER — Other Ambulatory Visit: Payer: Self-pay

## 2018-10-18 DIAGNOSIS — Z20822 Contact with and (suspected) exposure to covid-19: Secondary | ICD-10-CM

## 2018-10-19 LAB — NOVEL CORONAVIRUS, NAA: SARS-CoV-2, NAA: NOT DETECTED

## 2018-10-20 ENCOUNTER — Telehealth: Payer: Self-pay | Admitting: General Practice

## 2018-10-20 NOTE — Telephone Encounter (Signed)
Patient is calling to receive her COVID 19 test results. Patient expressed understanding.

## 2018-10-22 ENCOUNTER — Other Ambulatory Visit: Payer: Self-pay

## 2018-10-23 ENCOUNTER — Ambulatory Visit: Payer: BLUE CROSS/BLUE SHIELD | Admitting: Endocrinology

## 2018-10-23 ENCOUNTER — Ambulatory Visit (INDEPENDENT_AMBULATORY_CARE_PROVIDER_SITE_OTHER): Payer: BC Managed Care – PPO | Admitting: Endocrinology

## 2018-10-23 ENCOUNTER — Encounter: Payer: Self-pay | Admitting: Endocrinology

## 2018-10-23 VITALS — BP 124/82 | HR 116 | Ht 63.0 in | Wt 191.6 lb

## 2018-10-23 DIAGNOSIS — Z794 Long term (current) use of insulin: Secondary | ICD-10-CM

## 2018-10-23 DIAGNOSIS — R102 Pelvic and perineal pain: Secondary | ICD-10-CM | POA: Diagnosis not present

## 2018-10-23 DIAGNOSIS — N186 End stage renal disease: Secondary | ICD-10-CM | POA: Diagnosis not present

## 2018-10-23 DIAGNOSIS — Z992 Dependence on renal dialysis: Secondary | ICD-10-CM | POA: Diagnosis not present

## 2018-10-23 DIAGNOSIS — R109 Unspecified abdominal pain: Secondary | ICD-10-CM | POA: Diagnosis not present

## 2018-10-23 DIAGNOSIS — E1122 Type 2 diabetes mellitus with diabetic chronic kidney disease: Secondary | ICD-10-CM | POA: Diagnosis not present

## 2018-10-23 DIAGNOSIS — D071 Carcinoma in situ of vulva: Secondary | ICD-10-CM | POA: Diagnosis not present

## 2018-10-23 DIAGNOSIS — N951 Menopausal and female climacteric states: Secondary | ICD-10-CM | POA: Diagnosis not present

## 2018-10-23 LAB — POCT GLYCOSYLATED HEMOGLOBIN (HGB A1C): Hemoglobin A1C: 9.6 % — AB (ref 4.0–5.6)

## 2018-10-23 MED ORDER — INSULIN LISPRO PROT & LISPRO (75-25 MIX) 100 UNIT/ML ~~LOC~~ SUSP: SUBCUTANEOUS | 11 refills | Status: DC

## 2018-10-23 NOTE — Patient Instructions (Addendum)
check your blood sugar twice a day.  vary the time of day when you check, between before the 3 meals, and at bedtime.  also check if you have symptoms of your blood sugar being too high or too low.  please keep a record of the readings and bring it to your next appointment here (or you can bring the meter itself).  You can write it on any piece of paper.  please call us sooner if your blood sugar goes below 70, or if you have a lot of readings over 200.   To help you remember the insulin, put the pen next to something you use each day, such as your toothbrush.  This is the next step in your diabetes therapy. On this type of insulin schedule, you should eat meals on a regular schedule.  If a meal is missed or significantly delayed, your blood sugar could go low.   Please change the 75/25 insulin to 40 units with breakfast, and 10 units with supper.  please stop taking the regular insulin.   Please come back for a follow-up appointment in 1 month.

## 2018-10-23 NOTE — Progress Notes (Signed)
Subjective:    Patient ID: Morgan [name], female    DOB: [birth year], 48 y.o.   MRN: [id number]  HPI Pt returns for f/u of diabetes mellitus: DM type: Insulin-requiring type 2 Dx'ed: 1998.  Complications: polyneuropathy, proliferative retinopathy, and ESRD.   Therapy: insulin since dx.   GDM: never.  DKA: never.  Severe hypoglycemia: never.  Pancreatitis: never.   Other: She took an intentional insulin overdose in 2014, in a suicide attempt; therapy has been limited by noncompliance, so she is on a qd insulin schedule; she had a renal transplant in 2018.   Interval history: She takes humalog 75/25, 30 units with breakfast, and 20 units with supper. she misses approx 3 doses per week.  She also takes PRN reg (averages 3-4 units/day).  I reviewed continuous glucose monitor data.  Glucose varies from 60-335.  It is in general higher as the day goes on.    Past Medical History:  Diagnosis Date   Anemia    low iron   Constipation    when given iron   Diabetes mellitus without complication (HCC)    type 2   Diabetic retinopathy ([place])    H/O detached retina repair    bilateral   Hypertension    Pneumonia    Renal disorder    dialysis on T/Th/Sa    Past Surgical History:  Procedure Laterality Date   ARTERIOVENOUS GRAFT PLACEMENT Left 07/2010   AV FISTULA PLACEMENT Right 11/25/2015   Procedure: INSERTION OF ARTERIOVENOUS (AV) GORE-TEX GRAFT RIGHT  ARM USING 4-7 MM X 45 CM STRETCH GRAFT.;  Surgeon: Angelia Mould, MD;  Location: [place];  Service: Vascular;  Laterality: Right;   basilic vein transposition Left 2011   CESAREAN SECTION     CHOLECYSTECTOMY N/A 10/08/2014   Procedure: LAPAROSCOPIC CHOLECYSTECTOMY ;  Surgeon: Erroll Luna, MD;  Location: [place];  Service: General;  Laterality: N/A;   EYE SURGERY     retinal detachment   REVISION OF ARTERIOVENOUS GORETEX GRAFT Left 05/13/2015   Procedure: REVISION OF ARTERIOVENOUS GORETEX GRAFT;  Surgeon:  Angelia Mould, MD;  Location: [facility];  Service: Vascular;  Laterality: Left;    Social History   Socioeconomic History   Marital status: Married    Spouse name: Not on file   Number of children: Not on file   Years of education: Not on file   Highest education level: Not on file  Occupational History   Not on file  Social Needs   Financial resource strain: Not on file   Food insecurity    Worry: Not on file    Inability: Not on file   Transportation needs    Medical: Not on file    Non-medical: Not on file  Tobacco Use   Smoking status: Never Smoker   Smokeless tobacco: Never Used  Substance and Sexual Activity   Alcohol use: No    Alcohol/week: 0.0 standard drinks   Drug use: No   Sexual activity: Not on file  Lifestyle   Physical activity    Days per week: Not on file    Minutes per session: Not on file   Stress: Not on file  Relationships   Social connections    Talks on phone: Not on file    Gets together: Not on file    Attends religious service: Not on file    Active member of club or organization: Not on file    Attends meetings of clubs or organizations:  Not on file    Relationship status: Not on file   Intimate partner violence    Fear of current or ex partner: Not on file    Emotionally abused: Not on file    Physically abused: Not on file    Forced sexual activity: Not on file  Other Topics Concern   Not on file  Social History Narrative   Not on file    Current Outpatient Medications on File Prior to Visit  Medication Sig Dispense Refill   amLODipine (NORVASC) 5 MG tablet Take 5 mg by mouth at bedtime.  11   aspirin 81 MG tablet Take 81 mg by mouth daily.     B Complex-C-Folic Acid (RENA-VITE PO) Take 1 tablet by mouth daily.     Continuous Blood Gluc Receiver (FREESTYLE LIBRE 14 DAY READER) DEVI 1 Device by Does not apply route as needed. 1 Device 0   Continuous Blood Gluc Sensor (FREESTYLE LIBRE 14 DAY SENSOR)  MISC 1 Device by Other route every 14 (fourteen) days. 6 each 3   folic acid (FOLVITE) 1 MG tablet Take 1 mg by mouth daily.  0   glucose blood (ONE TOUCH ULTRA TEST) test strip 1 each by Other route 2 (two) times daily. And lancets 2/day 100 each 12   imiquimod (ALDARA) 5 % cream USE THREE TIMES WEEKLY AT BEDTIME. WASH OFF WITH SOAP AND WATER 6 TO 10 HOURS LATER. MAX 16 WEEKS. AVOID SEXUAL CONTACT WHILE CREAM IN ON SK  0   Insulin Syringe-Needle U-100 (INSULIN SYRINGE .5CC/30GX1/2") 30G X 1/2" 0.5 ML MISC Use to inject insulin BID 60 each 0   levonorgestrel (MIRENA) 20 MCG/24HR IUD 1 each by Intrauterine route once.     lidocaine-prilocaine (EMLA) cream Apply 1 application topically daily as needed (dialysis).      mycophenolate (CELLCEPT) 250 MG capsule Take 1,000 mg by mouth 2 (two) times daily.     polyethylene glycol (MIRALAX / GLYCOLAX) packet Take 17 g by mouth daily.     predniSONE (DELTASONE) 5 MG tablet Take 5 mg by mouth daily with breakfast.     ranitidine (ZANTAC) 150 MG tablet Take 150 mg by mouth 2 (two) times daily.     sevelamer carbonate (RENVELA) 800 MG tablet Take 4,000 mg by mouth 3 (three) times daily with meals.      tacrolimus (PROGRAF) 1 MG capsule Take 9 mg by mouth 2 (two) times daily. 5 in the morning .     valGANciclovir (VALCYTE) 450 MG tablet Take by mouth daily. 1 tab every other day.      No current facility-administered medications on file prior to visit.     Allergies  Allergen Reactions   Fluorescein Shortness Of Breath and Other (See Comments)    Hypertension   Iodinated Diagnostic Agents Anaphylaxis, Hives and Other (See Comments)    Diffuse swelling   Iohexol Anaphylaxis, Hives, Itching, Swelling and Other (See Comments)    13 HR PRE-MEDS REQUIRED   Latex Itching    Family History  Problem Relation Age of Onset   Diabetes Maternal Grandmother     BP 124/82 (BP Location: Right Leg, Patient Position: Sitting, Cuff Size: Large)     Pulse (!) 116    Ht 5\' 3"  (1.6 m)    Wt 191 lb 9.6 oz (86.9 kg)    SpO2 96%    BMI 33.94 kg/m    Review of Systems Denies LOC    Objective:   Physical Exam  VITAL SIGNS:  See vs page GENERAL: no distress Pulses: dorsalis pedis intact bilat.   MSK: no deformity of the feet CV: no leg edema Skin:  no ulcer on the feet.  normal color and temp on the feet. Neuro: sensation is intact to touch on the feet  Lab Results  Component Value Date   HGBA1C 10.4 (A) 07/17/2018   Care : TSH was normal    Assessment & Plan:  Insulin-requiring type 2 DM, with PDR: poor glycemic control. Noncompliance with insulin shots.  she wants to continue 75/25.  We will only continue this if pt can take qd.   Renal failure: in this setting, she may not need a PM dose.  Hypoglycemia: this limits aggressiveness of glycemic control.  Tachycardia: TSH was normal < 1 year ago.    Patient Instructions  check your blood sugar twice a day.  vary the time of day when you check, between before the 3 meals, and at bedtime.  also check if you have symptoms of your blood sugar being too high or too low.  please keep a record of the readings and bring it to your next appointment here (or you can bring the meter itself).  You can write it on any piece of paper.  please call us sooner if your blood sugar goes below 70, or if you have a lot of readings over 200.   To help you remember the insulin, put the pen next to something you use each day, such as your toothbrush.  This is the next step in your diabetes therapy. On this type of insulin schedule, you should eat meals on a regular schedule.  If a meal is missed or significantly delayed, your blood sugar could go low.   Please change the 75/25 insulin to 40 units with breakfast, and 10 units with supper.  please stop taking the regular insulin.   Please come back for a follow-up appointment in 1 month.

## 2018-10-28 DIAGNOSIS — Z940 Kidney transplant status: Secondary | ICD-10-CM | POA: Diagnosis not present

## 2018-10-28 DIAGNOSIS — D631 Anemia in chronic kidney disease: Secondary | ICD-10-CM | POA: Diagnosis not present

## 2018-10-28 DIAGNOSIS — N189 Chronic kidney disease, unspecified: Secondary | ICD-10-CM | POA: Diagnosis not present

## 2018-10-28 DIAGNOSIS — Z9225 Personal history of immunosupression therapy: Secondary | ICD-10-CM | POA: Diagnosis not present

## 2018-10-28 DIAGNOSIS — N2581 Secondary hyperparathyroidism of renal origin: Secondary | ICD-10-CM | POA: Diagnosis not present

## 2018-11-20 ENCOUNTER — Ambulatory Visit: Payer: BC Managed Care – PPO | Admitting: Endocrinology

## 2018-11-24 ENCOUNTER — Other Ambulatory Visit: Payer: Self-pay

## 2018-11-26 ENCOUNTER — Encounter: Payer: Self-pay | Admitting: Endocrinology

## 2018-11-26 ENCOUNTER — Ambulatory Visit (INDEPENDENT_AMBULATORY_CARE_PROVIDER_SITE_OTHER): Payer: BC Managed Care – PPO | Admitting: Endocrinology

## 2018-11-26 ENCOUNTER — Other Ambulatory Visit: Payer: Self-pay

## 2018-11-26 VITALS — BP 124/72 | HR 101 | Ht 63.0 in | Wt 193.8 lb

## 2018-11-26 DIAGNOSIS — E1122 Type 2 diabetes mellitus with diabetic chronic kidney disease: Secondary | ICD-10-CM | POA: Diagnosis not present

## 2018-11-26 DIAGNOSIS — N186 End stage renal disease: Secondary | ICD-10-CM | POA: Diagnosis not present

## 2018-11-26 DIAGNOSIS — Z992 Dependence on renal dialysis: Secondary | ICD-10-CM

## 2018-11-26 DIAGNOSIS — Z794 Long term (current) use of insulin: Secondary | ICD-10-CM | POA: Diagnosis not present

## 2018-11-26 MED ORDER — INSULIN LISPRO PROT & LISPRO (75-25 MIX) 100 UNIT/ML KWIKPEN: PEN_INJECTOR | SUBCUTANEOUS | 11 refills | Status: DC

## 2018-11-26 NOTE — Progress Notes (Signed)
Subjective:    Patient ID: Morgan [name], female    DOB: Sep 19, 1970, 48 y.o.   MRN: [id number]  HPI Pt returns for f/u of diabetes mellitus: DM type: Insulin-requiring type 2 Dx'ed: 1998.  Complications: polyneuropathy, proliferative retinopathy, and ESRD.   Therapy: insulin since dx.   GDM: never.  DKA: never.  Severe hypoglycemia: never.  Pancreatitis: never.   Other: She took an intentional insulin overdose in 2014, in a suicide attempt; therapy has been limited by noncompliance, so she is on a BID insulin schedule; she had a renal transplant in 2018.   Interval history: no cbg record, but states cbg's vary from 47-311.  It is in general higher as the day goes on.  She takes 40 units qam and 20 units qpm.  Past Medical History:  Diagnosis Date  . Anemia    low iron  . Constipation    when given iron  . Diabetes mellitus without complication ([place])    type 2  . Diabetic retinopathy ([place])   . H/O detached retina repair    bilateral  . Hypertension   . Pneumonia   . Renal disorder    dialysis on T/Th/Sa    Past Surgical History:  Procedure Laterality Date  . ARTERIOVENOUS GRAFT PLACEMENT Left 07/2010  . AV FISTULA PLACEMENT Right 11/25/2015   Procedure: INSERTION OF ARTERIOVENOUS (AV) GORE-TEX GRAFT RIGHT  ARM USING 4-7 MM X 45 CM STRETCH GRAFT.;  Surgeon: Angelia Mould, MD;  Location: [facility] OR;  Service: Vascular;  Laterality: Right;  . basilic vein transposition Left 2011  . CESAREAN SECTION    . CHOLECYSTECTOMY N/A 10/08/2014   Procedure: LAPAROSCOPIC CHOLECYSTECTOMY ;  Surgeon: Erroll Luna, MD;  Location: [place];  Service: General;  Laterality: N/A;  . EYE SURGERY     retinal detachment  . REVISION OF ARTERIOVENOUS GORETEX GRAFT Left 05/13/2015   Procedure: REVISION OF ARTERIOVENOUS GORETEX GRAFT;  Surgeon: Angelia Mould, MD;  Location: [place];  Service: Vascular;  Laterality: Left;    Social History   Socioeconomic History  . Marital  status: Married    Spouse name: Not on file  . Number of children: Not on file  . Years of education: Not on file  . Highest education level: Not on file  Occupational History  . Not on file  Social Needs  . Financial resource strain: Not on file  . Food insecurity    Worry: Not on file    Inability: Not on file  . Transportation needs    Medical: Not on file    Non-medical: Not on file  Tobacco Use  . Smoking status: Never Smoker  . Smokeless tobacco: Never Used  Substance and Sexual Activity  . Alcohol use: No    Alcohol/week: 0.0 standard drinks  . Drug use: No  . Sexual activity: Not on file  Lifestyle  . Physical activity    Days per week: Not on file    Minutes per session: Not on file  . Stress: Not on file  Relationships  . Social Herbalist on phone: Not on file    Gets together: Not on file    Attends religious service: Not on file    Active member of club or organization: Not on file    Attends meetings of clubs or organizations: Not on file    Relationship status: Not on file  . Intimate partner violence    Fear of current or ex  partner: Not on file    Emotionally abused: Not on file    Physically abused: Not on file    Forced sexual activity: Not on file  Other Topics Concern  . Not on file  Social History Narrative  . Not on file    Current Outpatient Medications on File Prior to Visit  Medication Sig Dispense Refill  . amLODipine (NORVASC) 5 MG tablet Take 5 mg by mouth at bedtime.  11  . aspirin 81 MG tablet Take 81 mg by mouth daily.    . B Complex-C-Folic Acid (RENA-VITE PO) Take 1 tablet by mouth daily.    . Continuous Blood Gluc Receiver (FREESTYLE LIBRE 14 DAY READER) DEVI 1 Device by Does not apply route as needed. 1 Device 0  . Continuous Blood Gluc Sensor (FREESTYLE LIBRE 14 DAY SENSOR) MISC 1 Device by Other route every 14 (fourteen) days. 6 each 3  . folic acid (FOLVITE) 1 MG tablet Take 1 mg by mouth daily.  0  . glucose  blood (ONE TOUCH ULTRA TEST) test strip 1 each by Other route 2 (two) times daily. And lancets 2/day 100 each 12  . imiquimod (ALDARA) 5 % cream USE THREE TIMES WEEKLY AT BEDTIME. WASH OFF WITH SOAP AND WATER 6 TO 10 HOURS LATER. MAX 16 WEEKS. AVOID SEXUAL CONTACT WHILE CREAM IN ON SK  0  . Insulin Syringe-Needle U-100 (INSULIN SYRINGE .5CC/30GX1/2") 30G X 1/2" 0.5 ML MISC Use to inject insulin BID 60 each 0  . levonorgestrel (MIRENA) 20 MCG/24HR IUD 1 each by Intrauterine route once.    . lidocaine-prilocaine (EMLA) cream Apply 1 application topically daily as needed (dialysis).     . mycophenolate (CELLCEPT) 250 MG capsule Take 1,000 mg by mouth 2 (two) times daily.    . polyethylene glycol (MIRALAX / GLYCOLAX) packet Take 17 g by mouth daily.    . predniSONE (DELTASONE) 5 MG tablet Take 5 mg by mouth daily with breakfast.    . ranitidine (ZANTAC) 150 MG tablet Take 150 mg by mouth 2 (two) times daily.    . sevelamer carbonate (RENVELA) 800 MG tablet Take 4,000 mg by mouth 3 (three) times daily with meals.     . tacrolimus (PROGRAF) 1 MG capsule Take 9 mg by mouth 2 (two) times daily. 5 in the morning .    . valGANciclovir (VALCYTE) 450 MG tablet Take by mouth daily. 1 tab every other day.      No current facility-administered medications on file prior to visit.     Allergies  Allergen Reactions  . Fluorescein Shortness Of Breath and Other (See Comments)    Hypertension  . Iodinated Diagnostic Agents Anaphylaxis, Hives and Other (See Comments)    Diffuse swelling  . Iohexol Anaphylaxis, Hives, Itching, Swelling and Other (See Comments)    13 HR PRE-MEDS REQUIRED  . Latex Itching    Family History  Problem Relation Age of Onset  . Diabetes Maternal Grandmother     BP 124/72 (BP Location: Right Leg, Patient Position: Sitting, Cuff Size: Normal)   Pulse (!) 101   Ht 5\' 3"  (1.6 m)   Wt 193 lb 12.8 oz (87.9 kg)   SpO2 95%   BMI 34.33 kg/m   Review of Systems Denies LOC.     Objective:   Physical Exam VITAL SIGNS:  See vs page GENERAL: no distress Pulses: dorsalis pedis intact bilat.   MSK: no deformity of the feet CV: no leg edema Skin:  no ulcer  on the feet.  normal color and temp on the feet. Neuro: sensation is intact to touch on the feet.   Lab Results  Component Value Date   HGBA1C 9.6 (A) 10/23/2018       Assessment & Plan:  Insulin-requiring type 2 DM, with DR: she needs increased rx Hypoglycemia: this limits aggressiveness of glycemic control.  We have to address this before we can increase total # of units per day Renal failure: he needs most, if not all, of his insulin in the morning.   Patient Instructions  check your blood sugar twice a day.  vary the time of day when you check, between before the 3 meals, and at bedtime.  also check if you have symptoms of your blood sugar being too high or too low.  please keep a record of the readings and bring it to your next appointment here (or you can bring the meter itself).  You can write it on any piece of paper.  please call us sooner if your blood sugar goes below 70, or if you have a lot of readings over 200.   To help you remember the insulin, put the pen next to something you use each day, such as your toothbrush.  This is the next step in your diabetes therapy. On this type of insulin schedule, you should eat meals on a regular schedule.  If a meal is missed or significantly delayed, your blood sugar could go low.   Please change the 75/25 insulin to 50 units with breakfast, and 10 units with supper.  Please come back for a follow-up appointment in 1 month.       Diabetes Mellitus and Nutrition, Adult When you have diabetes (diabetes mellitus), it is very important to have healthy eating habits because your blood sugar (glucose) levels are greatly affected by what you eat and drink. Eating healthy foods in the appropriate amounts, at about the same times every day, can help you:  Control  your blood glucose.  Lower your risk of heart disease.  Improve your blood pressure.  Reach or maintain a healthy weight. Every person with diabetes is different, and each person has different needs for a meal plan. Your health care provider may recommend that you work with a diet and nutrition specialist (dietitian) to make a meal plan that is best for you. Your meal plan may vary depending on factors such as:  The calories you need.  The medicines you take.  Your weight.  Your blood glucose, blood pressure, and cholesterol levels.  Your activity level.  Other health conditions you have, such as heart or kidney disease. How do carbohydrates affect me? Carbohydrates, also called carbs, affect your blood glucose level more than any other type of food. Eating carbs naturally raises the amount of glucose in your blood. Carb counting is a method for keeping track of how many carbs you eat. Counting carbs is important to keep your blood glucose at a healthy level, especially if you use insulin or take certain oral diabetes medicines. It is important to know how many carbs you can safely have in each meal. This is different for every person. Your dietitian can help you calculate how many carbs you should have at each meal and for each snack. Foods that contain carbs include:  Bread, cereal, rice, pasta, and crackers.  Potatoes and corn.  Peas, beans, and lentils.  Milk and yogurt.  Fruit and juice.  Desserts, such as cakes, cookies, ice  cream, and candy. How does alcohol affect me? Alcohol can cause a sudden decrease in blood glucose (hypoglycemia), especially if you use insulin or take certain oral diabetes medicines. Hypoglycemia can be a life-threatening condition. Symptoms of hypoglycemia (sleepiness, dizziness, and confusion) are similar to symptoms of having too much alcohol. If your health care provider says that alcohol is safe for you, follow these guidelines:  Limit  alcohol intake to no more than 1 drink per day for nonpregnant women and 2 drinks per day for men. One drink equals 12 oz of beer, 5 oz of wine, or 1 oz of hard liquor.  Do not drink on an empty stomach.  Keep yourself hydrated with water, diet soda, or unsweetened iced tea.  Keep in mind that regular soda, juice, and other mixers may contain a lot of sugar and must be counted as carbs. What are tips for following this plan?  Reading food labels  Start by checking the serving size on the "Nutrition Facts" label of packaged foods and drinks. The amount of calories, carbs, fats, and other nutrients listed on the label is based on one serving of the item. Many items contain more than one serving per package.  Check the total grams (g) of carbs in one serving. You can calculate the number of servings of carbs in one serving by dividing the total carbs by 15. For example, if a food has 30 g of total carbs, it would be equal to 2 servings of carbs.  Check the number of grams (g) of saturated and trans fats in one serving. Choose foods that have low or no amount of these fats.  Check the number of milligrams (mg) of salt (sodium) in one serving. Most people should limit total sodium intake to less than 2,300 mg per day.  Always check the nutrition information of foods labeled as "low-fat" or "nonfat". These foods may be higher in added sugar or refined carbs and should be avoided.  Talk to your dietitian to identify your daily goals for nutrients listed on the label. Shopping  Avoid buying canned, premade, or processed foods. These foods tend to be high in fat, sodium, and added sugar.  Shop around the outside edge of the grocery store. This includes fresh fruits and vegetables, bulk grains, fresh meats, and fresh dairy. Cooking  Use low-heat cooking methods, such as baking, instead of high-heat cooking methods like deep frying.  Cook using healthy oils, such as olive, canola, or sunflower  oil.  Avoid cooking with butter, cream, or high-fat meats. Meal planning  Eat meals and snacks regularly, preferably at the same times every day. Avoid going long periods of time without eating.  Eat foods high in fiber, such as fresh fruits, vegetables, beans, and whole grains. Talk to your dietitian about how many servings of carbs you can eat at each meal.  Eat 4-6 ounces (oz) of lean protein each day, such as lean meat, chicken, fish, eggs, or tofu. One oz of lean protein is equal to: ? 1 oz of meat, chicken, or fish. ? 1 egg. ?  cup of tofu.  Eat some foods each day that contain healthy fats, such as avocado, nuts, seeds, and fish. Lifestyle  Check your blood glucose regularly.  Exercise regularly as told by your health care provider. This may include: ? 150 minutes of moderate-intensity or vigorous-intensity exercise each week. This could be brisk walking, biking, or water aerobics. ? Stretching and doing strength exercises, such as yoga or  weightlifting, at least 2 times a week.  Take medicines as told by your health care provider.  Do not use any products that contain nicotine or tobacco, such as cigarettes and e-cigarettes. If you need help quitting, ask your health care provider.  Work with a Social worker or diabetes educator to identify strategies to manage stress and any emotional and social challenges. Questions to ask a health care provider  Do I need to meet with a diabetes educator?  Do I need to meet with a dietitian?  What number can I call if I have questions?  When are the best times to check my blood glucose? Where to find more information:  American Diabetes Association: diabetes.org  Academy of Nutrition and Dietetics: www.eatright.[employer] of Diabetes and Digestive and Kidney Diseases (NIH): [employer] Summary  A healthy meal plan will help you control your blood glucose and maintain a healthy lifestyle.  Working with a diet  and nutrition specialist (dietitian) can help you make a meal plan that is best for you.  Keep in mind that carbohydrates (carbs) and alcohol have immediate effects on your blood glucose levels. It is important to count carbs and to use alcohol carefully. This information is not intended to replace advice given to you by your health care provider. Make sure you discuss any questions you have with your health care provider. Document Released: 11/09/2004 Document Revised: 01/25/2017 Document Reviewed: 03/19/2016 Elsevier Patient Education  2020 [employer].

## 2018-11-26 NOTE — Patient Instructions (Addendum)
check your blood sugar twice a day.  vary the time of day when you check, between before the 3 meals, and at bedtime.  also check if you have symptoms of your blood sugar being too high or too low.  please keep a record of the readings and bring it to your next appointment here (or you can bring the meter itself).  You can write it on any piece of paper.  please call us sooner if your blood sugar goes below 70, or if you have a lot of readings over 200.   To help you remember the insulin, put the pen next to something you use each day, such as your toothbrush.  This is the next step in your diabetes therapy. On this type of insulin schedule, you should eat meals on a regular schedule.  If a meal is missed or significantly delayed, your blood sugar could go low.   Please change the 75/25 insulin to 50 units with breakfast, and 10 units with supper.  Please come back for a follow-up appointment in 1 month.       Diabetes Mellitus and Nutrition, Adult When you have diabetes (diabetes mellitus), it is very important to have healthy eating habits because your blood sugar (glucose) levels are greatly affected by what you eat and drink. Eating healthy foods in the appropriate amounts, at about the same times every day, can help you:  Control your blood glucose.  Lower your risk of heart disease.  Improve your blood pressure.  Reach or maintain a healthy weight. Every person with diabetes is different, and each person has different needs for a meal plan. Your health care provider may recommend that you work with a diet and nutrition specialist (dietitian) to make a meal plan that is best for you. Your meal plan may vary depending on factors such as:  The calories you need.  The medicines you take.  Your weight.  Your blood glucose, blood pressure, and cholesterol levels.  Your activity level.  Other health conditions you have, such as heart or kidney disease. How do carbohydrates affect  me? Carbohydrates, also called carbs, affect your blood glucose level more than any other type of food. Eating carbs naturally raises the amount of glucose in your blood. Carb counting is a method for keeping track of how many carbs you eat. Counting carbs is important to keep your blood glucose at a healthy level, especially if you use insulin or take certain oral diabetes medicines. It is important to know how many carbs you can safely have in each meal. This is different for every person. Your dietitian can help you calculate how many carbs you should have at each meal and for each snack. Foods that contain carbs include:  Bread, cereal, rice, pasta, and crackers.  Potatoes and corn.  Peas, beans, and lentils.  Milk and yogurt.  Fruit and juice.  Desserts, such as cakes, cookies, ice cream, and candy. How does alcohol affect me? Alcohol can cause a sudden decrease in blood glucose (hypoglycemia), especially if you use insulin or take certain oral diabetes medicines. Hypoglycemia can be a life-threatening condition. Symptoms of hypoglycemia (sleepiness, dizziness, and confusion) are similar to symptoms of having too much alcohol. If your health care provider says that alcohol is safe for you, follow these guidelines:  Limit alcohol intake to no more than 1 drink per day for nonpregnant women and 2 drinks per day for men. One drink equals 12 oz of beer, 5  oz of wine, or 1 oz of hard liquor.  Do not drink on an empty stomach.  Keep yourself hydrated with water, diet soda, or unsweetened iced tea.  Keep in mind that regular soda, juice, and other mixers may contain a lot of sugar and must be counted as carbs. What are tips for following this plan?  Reading food labels  Start by checking the serving size on the "Nutrition Facts" label of packaged foods and drinks. The amount of calories, carbs, fats, and other nutrients listed on the label is based on one serving of the item. Many items  contain more than one serving per package.  Check the total grams (g) of carbs in one serving. You can calculate the number of servings of carbs in one serving by dividing the total carbs by 15. For example, if a food has 30 g of total carbs, it would be equal to 2 servings of carbs.  Check the number of grams (g) of saturated and trans fats in one serving. Choose foods that have low or no amount of these fats.  Check the number of milligrams (mg) of salt (sodium) in one serving. Most people should limit total sodium intake to less than 2,300 mg per day.  Always check the nutrition information of foods labeled as "low-fat" or "nonfat". These foods may be higher in added sugar or refined carbs and should be avoided.  Talk to your dietitian to identify your daily goals for nutrients listed on the label. Shopping  Avoid buying canned, premade, or processed foods. These foods tend to be high in fat, sodium, and added sugar.  Shop around the outside edge of the grocery store. This includes fresh fruits and vegetables, bulk grains, fresh meats, and fresh dairy. Cooking  Use low-heat cooking methods, such as baking, instead of high-heat cooking methods like deep frying.  Cook using healthy oils, such as olive, canola, or sunflower oil.  Avoid cooking with butter, cream, or high-fat meats. Meal planning  Eat meals and snacks regularly, preferably at the same times every day. Avoid going long periods of time without eating.  Eat foods high in fiber, such as fresh fruits, vegetables, beans, and whole grains. Talk to your dietitian about how many servings of carbs you can eat at each meal.  Eat 4-6 ounces (oz) of lean protein each day, such as lean meat, chicken, fish, eggs, or tofu. One oz of lean protein is equal to: ? 1 oz of meat, chicken, or fish. ? 1 egg. ?  cup of tofu.  Eat some foods each day that contain healthy fats, such as avocado, nuts, seeds, and fish. Lifestyle  Check your  blood glucose regularly.  Exercise regularly as told by your health care provider. This may include: ? 150 minutes of moderate-intensity or vigorous-intensity exercise each week. This could be brisk walking, biking, or water aerobics. ? Stretching and doing strength exercises, such as yoga or weightlifting, at least 2 times a week.  Take medicines as told by your health care provider.  Do not use any products that contain nicotine or tobacco, such as cigarettes and e-cigarettes. If you need help quitting, ask your health care provider.  Work with a Social worker or diabetes educator to identify strategies to manage stress and any emotional and social challenges. Questions to ask a health care provider  Do I need to meet with a diabetes educator?  Do I need to meet with a dietitian?  What number can I call if  I have questions?  When are the best times to check my blood glucose? Where to find more information:  American Diabetes Association: diabetes.org  Academy of Nutrition and Dietetics: www.eatright.[employer] of Diabetes and Digestive and Kidney Diseases (NIH): [employer] Summary  A healthy meal plan will help you control your blood glucose and maintain a healthy lifestyle.  Working with a diet and nutrition specialist (dietitian) can help you make a meal plan that is best for you.  Keep in mind that carbohydrates (carbs) and alcohol have immediate effects on your blood glucose levels. It is important to count carbs and to use alcohol carefully. This information is not intended to replace advice given to you by your health care provider. Make sure you discuss any questions you have with your health care provider. Document Released: 11/09/2004 Document Revised: 01/25/2017 Document Reviewed: 03/19/2016 Elsevier Patient Education  2020 [employer].

## 2018-12-02 DIAGNOSIS — A630 Anogenital (venereal) warts: Secondary | ICD-10-CM | POA: Diagnosis not present

## 2018-12-17 DIAGNOSIS — I10 Essential (primary) hypertension: Secondary | ICD-10-CM | POA: Diagnosis not present

## 2018-12-17 DIAGNOSIS — Z6835 Body mass index (BMI) 35.0-35.9, adult: Secondary | ICD-10-CM | POA: Diagnosis not present

## 2018-12-17 DIAGNOSIS — I739 Peripheral vascular disease, unspecified: Secondary | ICD-10-CM | POA: Diagnosis not present

## 2018-12-26 ENCOUNTER — Other Ambulatory Visit: Payer: Self-pay

## 2018-12-30 ENCOUNTER — Ambulatory Visit (INDEPENDENT_AMBULATORY_CARE_PROVIDER_SITE_OTHER): Payer: BC Managed Care – PPO | Admitting: Endocrinology

## 2018-12-30 ENCOUNTER — Other Ambulatory Visit: Payer: Self-pay

## 2018-12-30 ENCOUNTER — Encounter: Payer: Self-pay | Admitting: Endocrinology

## 2018-12-30 VITALS — BP 126/74 | HR 76 | Ht 63.0 in | Wt 193.0 lb

## 2018-12-30 DIAGNOSIS — Z794 Long term (current) use of insulin: Secondary | ICD-10-CM | POA: Diagnosis not present

## 2018-12-30 DIAGNOSIS — Z992 Dependence on renal dialysis: Secondary | ICD-10-CM | POA: Diagnosis not present

## 2018-12-30 DIAGNOSIS — E1122 Type 2 diabetes mellitus with diabetic chronic kidney disease: Secondary | ICD-10-CM | POA: Diagnosis not present

## 2018-12-30 DIAGNOSIS — N186 End stage renal disease: Secondary | ICD-10-CM | POA: Diagnosis not present

## 2018-12-30 LAB — POCT GLYCOSYLATED HEMOGLOBIN (HGB A1C): Hemoglobin A1C: 10.3 % — AB (ref 4.0–5.6)

## 2018-12-30 MED ORDER — FREESTYLE LIBRE 14 DAY SENSOR MISC: 1.0000 | 3 refills | Status: DC

## 2018-12-30 MED ORDER — INSULIN LISPRO PROT & LISPRO (75-25 MIX) 100 UNIT/ML KWIKPEN: 60.0000 [IU] | PEN_INJECTOR | Freq: Every day | SUBCUTANEOUS | 11 refills | Status: DC

## 2018-12-30 MED ORDER — FREESTYLE LIBRE 14 DAY READER DEVI: 1.0000 | 0 refills | Status: DC | PRN

## 2018-12-30 NOTE — Progress Notes (Signed)
Subjective:    Patient ID: Morgan [name], female    DOB: 08-10-1970, 48 y.o.   MRN: [id number]  HPI Pt returns for f/u of diabetes mellitus: DM type: Insulin-requiring type 2 Dx'ed: 1998.  Complications: polyneuropathy, proliferative retinopathy, and ESRD.   Therapy: insulin since dx.   GDM: never.  DKA: never.  Severe hypoglycemia: never.  Pancreatitis: never.   Other: She took an intentional insulin overdose in 2014, in a suicide attempt; therapy has been limited by noncompliance, so she is on a BID insulin schedule; she had a renal transplant in 2018.   Interval history: I reviewed continuous glucose monitor data.  Glucose varies from 65-300.  it decreases throughout the night.  She sometimes misses insulin doses.   Past Medical History:  Diagnosis Date  . Anemia    low iron  . Constipation    when given iron  . Diabetes mellitus without complication ([place])    type 2  . Diabetic retinopathy ([place])   . H/O detached retina repair    bilateral  . Hypertension   . Pneumonia   . Renal disorder    dialysis on T/Th/Sa    Past Surgical History:  Procedure Laterality Date  . ARTERIOVENOUS GRAFT PLACEMENT Left 07/2010  . AV FISTULA PLACEMENT Right 11/25/2015   Procedure: INSERTION OF ARTERIOVENOUS (AV) GORE-TEX GRAFT RIGHT  ARM USING 4-7 MM X 45 CM STRETCH GRAFT.;  Surgeon: Angelia Mould, MD;  Location: [facility] OR;  Service: Vascular;  Laterality: Right;  . basilic vein transposition Left 2011  . CESAREAN SECTION    . CHOLECYSTECTOMY N/A 10/08/2014   Procedure: LAPAROSCOPIC CHOLECYSTECTOMY ;  Surgeon: Erroll Luna, MD;  Location: [facility];  Service: General;  Laterality: N/A;  . EYE SURGERY     retinal detachment  . REVISION OF ARTERIOVENOUS GORETEX GRAFT Left 05/13/2015   Procedure: REVISION OF ARTERIOVENOUS GORETEX GRAFT;  Surgeon: Angelia Mould, MD;  Location: [facility];  Service: Vascular;  Laterality: Left;    Social History   Socioeconomic History  .  Marital status: Married    Spouse name: Not on file  . Number of children: Not on file  . Years of education: Not on file  . Highest education level: Not on file  Occupational History  . Not on file  Social Needs  . Financial resource strain: Not on file  . Food insecurity    Worry: Not on file    Inability: Not on file  . Transportation needs    Medical: Not on file    Non-medical: Not on file  Tobacco Use  . Smoking status: Never Smoker  . Smokeless tobacco: Never Used  Substance and Sexual Activity  . Alcohol use: No    Alcohol/week: 0.0 standard drinks  . Drug use: No  . Sexual activity: Not on file  Lifestyle  . Physical activity    Days per week: Not on file    Minutes per session: Not on file  . Stress: Not on file  Relationships  . Social Herbalist on phone: Not on file    Gets together: Not on file    Attends religious service: Not on file    Active member of club or organization: Not on file    Attends meetings of clubs or organizations: Not on file    Relationship status: Not on file  . Intimate partner violence    Fear of current or ex partner: Not on file  Emotionally abused: Not on file    Physically abused: Not on file    Forced sexual activity: Not on file  Other Topics Concern  . Not on file  Social History Narrative  . Not on file    Current Outpatient Medications on File Prior to Visit  Medication Sig Dispense Refill  . amLODipine (NORVASC) 5 MG tablet Take 5 mg by mouth at bedtime.  11  . aspirin 81 MG tablet Take 81 mg by mouth daily.    . B Complex-C-Folic Acid (RENA-VITE PO) Take 1 tablet by mouth daily.    . folic acid (FOLVITE) 1 MG tablet Take 1 mg by mouth daily.  0  . glucose blood (ONE TOUCH ULTRA TEST) test strip 1 each by Other route 2 (two) times daily. And lancets 2/day 100 each 12  . imiquimod (ALDARA) 5 % cream USE THREE TIMES WEEKLY AT BEDTIME. WASH OFF WITH SOAP AND WATER 6 TO 10 HOURS LATER. MAX 16 WEEKS. AVOID  SEXUAL CONTACT WHILE CREAM IN ON SK  0  . levonorgestrel (MIRENA) 20 MCG/24HR IUD 1 each by Intrauterine route once.    . lidocaine-prilocaine (EMLA) cream Apply 1 application topically daily as needed (dialysis).     . mycophenolate (CELLCEPT) 250 MG capsule Take 1,000 mg by mouth 2 (two) times daily.    . polyethylene glycol (MIRALAX / GLYCOLAX) packet Take 17 g by mouth daily.    . predniSONE (DELTASONE) 5 MG tablet Take 5 mg by mouth daily with breakfast.    . ranitidine (ZANTAC) 150 MG tablet Take 150 mg by mouth 2 (two) times daily.    . sevelamer carbonate (RENVELA) 800 MG tablet Take 4,000 mg by mouth 3 (three) times daily with meals.     . tacrolimus (PROGRAF) 1 MG capsule Take 9 mg by mouth 2 (two) times daily. 5 in the morning .    . valGANciclovir (VALCYTE) 450 MG tablet Take by mouth daily. 1 tab every other day.      No current facility-administered medications on file prior to visit.     Allergies  Allergen Reactions  . Fluorescein Shortness Of Breath and Other (See Comments)    Hypertension  . Iodinated Diagnostic Agents Anaphylaxis, Hives and Other (See Comments)    Diffuse swelling  . Iohexol Anaphylaxis, Hives, Itching, Swelling and Other (See Comments)    13 HR PRE-MEDS REQUIRED  . Latex Itching    Family History  Problem Relation Age of Onset  . Diabetes Maternal Grandmother     BP 126/74 (BP Location: Left Arm, Patient Position: Sitting, Cuff Size: Normal)   Pulse 76   Ht 5\' 3"  (1.6 m)   Wt 193 lb (87.5 kg)   SpO2 97%   BMI 34.19 kg/m    Review of Systems Denies LOC.      Objective:   Physical Exam VITAL SIGNS:  See vs page GENERAL: no distress Pulses: dorsalis pedis intact bilat.   MSK: no deformity of the feet CV: no leg edema Skin:  no ulcer on the feet.  normal color and temp on the feet. Neuro: sensation is intact to touch on the feet  Lab Results  Component Value Date   HGBA1C 10.3 (A) 12/30/2018      Assessment & Plan:   Insulin-requiring type 2 DM, with PDR: worse: While continuous glucose monitor data are incomplete, there is enough info to say insulin should be just qam. Hypoglycemia: this limits aggressiveness of glycemic control Renal failure: in  this context, she does not need a PM insulin dose.  Pattern of cbg's supports this.   Noncompliance with insulin: qd insulin is preferred  Patient Instructions  check your blood sugar twice a day.  vary the time of day when you check, between before the 3 meals, and at bedtime.  also check if you have symptoms of your blood sugar being too high or too low.  please keep a record of the readings and bring it to your next appointment here (or you can bring the meter itself).  You can write it on any piece of paper.  please call us sooner if your blood sugar goes below 70, or if you have a lot of readings over 200.   To help you remember the insulin, put the pen next to something you use each day, such as your toothbrush.  This is the next step in your diabetes therapy. On this type of insulin schedule, you should eat meals on a regular schedule.  If a meal is missed or significantly delayed, your blood sugar could go low.   Please change the 75/25 insulin to 60 units with breakfast, and none with supper.   Please come back for a follow-up appointment in 2 months.

## 2018-12-30 NOTE — Patient Instructions (Addendum)
check your blood sugar twice a day.  vary the time of day when you check, between before the 3 meals, and at bedtime.  also check if you have symptoms of your blood sugar being too high or too low.  please keep a record of the readings and bring it to your next appointment here (or you can bring the meter itself).  You can write it on any piece of paper.  please call us sooner if your blood sugar goes below 70, or if you have a lot of readings over 200.   To help you remember the insulin, put the pen next to something you use each day, such as your toothbrush.  This is the next step in your diabetes therapy. On this type of insulin schedule, you should eat meals on a regular schedule.  If a meal is missed or significantly delayed, your blood sugar could go low.   Please change the 75/25 insulin to 60 units with breakfast, and none with supper.   Please come back for a follow-up appointment in 2 months.

## 2018-12-31 ENCOUNTER — Other Ambulatory Visit: Payer: Self-pay

## 2018-12-31 ENCOUNTER — Telehealth: Payer: Self-pay | Admitting: Endocrinology

## 2018-12-31 DIAGNOSIS — E1122 Type 2 diabetes mellitus with diabetic chronic kidney disease: Secondary | ICD-10-CM

## 2018-12-31 DIAGNOSIS — Z794 Long term (current) use of insulin: Secondary | ICD-10-CM

## 2018-12-31 MED ORDER — PEN NEEDLES 32G X 5 MM MISC: 1.0000 | Freq: Every day | 1 refills | Status: DC

## 2018-12-31 MED ORDER — INSULIN LISPRO PROT & LISPRO (75-25 MIX) 100 UNIT/ML KWIKPEN: 60.0000 [IU] | PEN_INJECTOR | Freq: Every day | SUBCUTANEOUS | 11 refills | Status: DC

## 2018-12-31 NOTE — Telephone Encounter (Signed)
60 units with breakfast is correct

## 2018-12-31 NOTE — Telephone Encounter (Signed)
Insulin Lispro Prot & Lispro (HUMALOG MIX 75/25 KWIKPEN) (75-25) 100 UNIT/ML Kwikpen 10 pen 11 12/31/2018    Sig - Route: Inject 60 Units into the skin daily with breakfast. Dosage has been verified and deemed accurate by Dr. Loanne Drilling - Subcutaneous   Sent to pharmacy as: Insulin Lispro Prot & Lispro (HUMALOG MIX 75/25 KWIKPEN) (75-25) 100 UNIT/ML Kwikpen   E-Prescribing Status: Receipt confirmed by pharmacy (12/31/2018 10:48 AM EST)    Rx sent again but with the following added: Dosage has been verified and deemed accurate by Dr. Loanne Drilling

## 2018-12-31 NOTE — Telephone Encounter (Signed)
Please verify Rx. May need to resend with written instructions on the Rx stating this was reviewed and confirmed

## 2018-12-31 NOTE — Telephone Encounter (Signed)
[facility] called to verify Humalog RX instructions for RX they just received  Call back number [phone]

## 2019-01-12 ENCOUNTER — Other Ambulatory Visit: Payer: Self-pay | Admitting: Obstetrics

## 2019-01-12 DIAGNOSIS — N760 Acute vaginitis: Secondary | ICD-10-CM | POA: Diagnosis not present

## 2019-01-12 DIAGNOSIS — N901 Moderate vulvar dysplasia: Secondary | ICD-10-CM | POA: Diagnosis not present

## 2019-01-12 DIAGNOSIS — D071 Carcinoma in situ of vulva: Secondary | ICD-10-CM | POA: Diagnosis not present

## 2019-01-28 DIAGNOSIS — I129 Hypertensive chronic kidney disease with stage 1 through stage 4 chronic kidney disease, or unspecified chronic kidney disease: Secondary | ICD-10-CM | POA: Diagnosis not present

## 2019-01-28 DIAGNOSIS — D72819 Decreased white blood cell count, unspecified: Secondary | ICD-10-CM | POA: Diagnosis not present

## 2019-01-28 DIAGNOSIS — E1122 Type 2 diabetes mellitus with diabetic chronic kidney disease: Secondary | ICD-10-CM | POA: Diagnosis not present

## 2019-01-28 DIAGNOSIS — Z4822 Encounter for aftercare following kidney transplant: Secondary | ICD-10-CM | POA: Diagnosis not present

## 2019-01-28 DIAGNOSIS — D8489 Other immunodeficiencies: Secondary | ICD-10-CM | POA: Diagnosis not present

## 2019-01-28 DIAGNOSIS — Z8639 Personal history of other endocrine, nutritional and metabolic disease: Secondary | ICD-10-CM | POA: Diagnosis not present

## 2019-01-28 DIAGNOSIS — Z23 Encounter for immunization: Secondary | ICD-10-CM | POA: Diagnosis not present

## 2019-01-28 DIAGNOSIS — Z79899 Other long term (current) drug therapy: Secondary | ICD-10-CM | POA: Diagnosis not present

## 2019-01-28 DIAGNOSIS — L659 Nonscarring hair loss, unspecified: Secondary | ICD-10-CM | POA: Diagnosis not present

## 2019-01-28 DIAGNOSIS — Z940 Kidney transplant status: Secondary | ICD-10-CM | POA: Diagnosis not present

## 2019-02-05 DIAGNOSIS — Z9842 Cataract extraction status, left eye: Secondary | ICD-10-CM | POA: Diagnosis not present

## 2019-02-05 DIAGNOSIS — E113593 Type 2 diabetes mellitus with proliferative diabetic retinopathy without macular edema, bilateral: Secondary | ICD-10-CM | POA: Diagnosis not present

## 2019-02-05 DIAGNOSIS — H35341 Macular cyst, hole, or pseudohole, right eye: Secondary | ICD-10-CM | POA: Diagnosis not present

## 2019-02-05 DIAGNOSIS — Z9841 Cataract extraction status, right eye: Secondary | ICD-10-CM | POA: Diagnosis not present

## 2019-02-16 DIAGNOSIS — D071 Carcinoma in situ of vulva: Secondary | ICD-10-CM | POA: Diagnosis not present

## 2019-03-04 ENCOUNTER — Ambulatory Visit (INDEPENDENT_AMBULATORY_CARE_PROVIDER_SITE_OTHER): Payer: BC Managed Care – PPO | Admitting: Endocrinology

## 2019-03-04 ENCOUNTER — Encounter: Payer: Self-pay | Admitting: Endocrinology

## 2019-03-04 ENCOUNTER — Other Ambulatory Visit: Payer: Self-pay

## 2019-03-04 VITALS — BP 134/74 | HR 97 | Ht 63.0 in | Wt 195.0 lb

## 2019-03-04 DIAGNOSIS — Z794 Long term (current) use of insulin: Secondary | ICD-10-CM

## 2019-03-04 DIAGNOSIS — N186 End stage renal disease: Secondary | ICD-10-CM | POA: Diagnosis not present

## 2019-03-04 DIAGNOSIS — Z992 Dependence on renal dialysis: Secondary | ICD-10-CM | POA: Diagnosis not present

## 2019-03-04 DIAGNOSIS — E1122 Type 2 diabetes mellitus with diabetic chronic kidney disease: Secondary | ICD-10-CM | POA: Diagnosis not present

## 2019-03-04 LAB — POCT GLYCOSYLATED HEMOGLOBIN (HGB A1C): Hemoglobin A1C: 12 % — AB (ref 4.0–5.6)

## 2019-03-04 MED ORDER — DEXCOM G6 SENSOR MISC: 1.0000 | 3 refills | Status: DC

## 2019-03-04 MED ORDER — DEXCOM G6 RECEIVER DEVI: 1.0000 | Freq: Once | 0 refills | Status: AC

## 2019-03-04 MED ORDER — ONETOUCH VERIO VI STRP: 1.0000 | ORAL_STRIP | Freq: Two times a day (BID) | 12 refills | Status: DC

## 2019-03-04 MED ORDER — DEXCOM G6 TRANSMITTER MISC: 1.0000 | Freq: Once | 0 refills | Status: AC

## 2019-03-04 NOTE — Progress Notes (Signed)
Subjective:    Patient ID: Morgan [name], female    DOB: 02/15/1971, 49 y.o.   MRN: [id number]  HPI Pt returns for f/u of diabetes mellitus: DM type: Insulin-requiring type 2 Dx'ed: 1998.  Complications: polyneuropathy, proliferative retinopathy, and ESRD.   Therapy: insulin since dx.   GDM: never.  DKA: never.  Severe hypoglycemia: never.  Pancreatitis: never.   Other: She took an intentional insulin overdose in 2014, in a suicide attempt; therapy has been limited by noncompliance, so she is on a QD insulin schedule; she had a renal transplant in 2018.   Interval history: Pt says her meter does not work.  She says her continuous glucose monitor does not work.  She wants to change to a different brand.  pt states she feels well in general.    Past Medical History:  Diagnosis Date  . Anemia    low iron  . Constipation    when given iron  . Diabetes mellitus without complication ([place])    type 2  . Diabetic retinopathy ([place])   . H/O detached retina repair    bilateral  . Hypertension   . Pneumonia   . Renal disorder    dialysis on T/Th/Sa    Past Surgical History:  Procedure Laterality Date  . ARTERIOVENOUS GRAFT PLACEMENT Left 07/2010  . AV FISTULA PLACEMENT Right 11/25/2015   Procedure: INSERTION OF ARTERIOVENOUS (AV) GORE-TEX GRAFT RIGHT  ARM USING 4-7 MM X 45 CM STRETCH GRAFT.;  Surgeon: Angelia Mould, MD;  Location: [facility] OR;  Service: Vascular;  Laterality: Right;  . basilic vein transposition Left 2011  . CESAREAN SECTION    . CHOLECYSTECTOMY N/A 10/08/2014   Procedure: LAPAROSCOPIC CHOLECYSTECTOMY ;  Surgeon: Erroll Luna, MD;  Location: [place];  Service: General;  Laterality: N/A;  . EYE SURGERY     retinal detachment  . REVISION OF ARTERIOVENOUS GORETEX GRAFT Left 05/13/2015   Procedure: REVISION OF ARTERIOVENOUS GORETEX GRAFT;  Surgeon: Angelia Mould, MD;  Location: [place];  Service: Vascular;  Laterality: Left;    Social History    Socioeconomic History  . Marital status: Married    Spouse name: Not on file  . Number of children: Not on file  . Years of education: Not on file  . Highest education level: Not on file  Occupational History  . Not on file  Tobacco Use  . Smoking status: Never Smoker  . Smokeless tobacco: Never Used  Substance and Sexual Activity  . Alcohol use: No    Alcohol/week: 0.0 standard drinks  . Drug use: No  . Sexual activity: Not on file  Other Topics Concern  . Not on file  Social History Narrative  . Not on file   Social Determinants of Health   Financial Resource Strain:   . Difficulty of Paying Living Expenses: Not on file  Food Insecurity:   . Worried About Charity fundraiser in the Last Year: Not on file  . Ran Out of Food in the Last Year: Not on file  Transportation Needs:   . Lack of Transportation (Medical): Not on file  . Lack of Transportation (Non-Medical): Not on file  Physical Activity:   . Days of Exercise per Week: Not on file  . Minutes of Exercise per Session: Not on file  Stress:   . Feeling of Stress : Not on file  Social Connections:   . Frequency of Communication with Friends and Family: Not on file  .  Frequency of Social Gatherings with Friends and Family: Not on file  . Attends Religious Services: Not on file  . Active Member of Clubs or Organizations: Not on file  . Attends Archivist Meetings: Not on file  . Marital Status: Not on file  Intimate Partner Violence:   . Fear of Current or Ex-Partner: Not on file  . Emotionally Abused: Not on file  . Physically Abused: Not on file  . Sexually Abused: Not on file    Current Outpatient Medications on File Prior to Visit  Medication Sig Dispense Refill  . amLODipine (NORVASC) 5 MG tablet Take 5 mg by mouth at bedtime.  11  . aspirin 81 MG tablet Take 81 mg by mouth daily.    . B Complex-C-Folic Acid (RENA-VITE PO) Take 1 tablet by mouth daily.    . folic acid (FOLVITE) 1 MG tablet  Take 1 mg by mouth daily.  0  . imiquimod (ALDARA) 5 % cream USE THREE TIMES WEEKLY AT BEDTIME. WASH OFF WITH SOAP AND WATER 6 TO 10 HOURS LATER. MAX 16 WEEKS. AVOID SEXUAL CONTACT WHILE CREAM IN ON SK  0  . Insulin Lispro Prot & Lispro (HUMALOG MIX 75/25 KWIKPEN) (75-25) 100 UNIT/ML Kwikpen Inject 60 Units into the skin daily with breakfast. Dosage has been verified and deemed accurate by Dr. Loanne Drilling 10 pen 11  . Insulin Pen Needle (PEN NEEDLES) 32G X 5 MM MISC 1 each by Does not apply route daily. E11.9 90 each 1  . levonorgestrel (MIRENA) 20 MCG/24HR IUD 1 each by Intrauterine route once.    . lidocaine-prilocaine (EMLA) cream Apply 1 application topically daily as needed (dialysis).     . mycophenolate (CELLCEPT) 250 MG capsule Take 1,000 mg by mouth 2 (two) times daily.    . polyethylene glycol (MIRALAX / GLYCOLAX) packet Take 17 g by mouth daily.    . predniSONE (DELTASONE) 5 MG tablet Take 5 mg by mouth daily with breakfast.    . ranitidine (ZANTAC) 150 MG tablet Take 150 mg by mouth 2 (two) times daily.    . sevelamer carbonate (RENVELA) 800 MG tablet Take 4,000 mg by mouth 3 (three) times daily with meals.     . tacrolimus (PROGRAF) 1 MG capsule Take 9 mg by mouth 2 (two) times daily. 5 in the morning .    . valGANciclovir (VALCYTE) 450 MG tablet Take by mouth daily. 1 tab every other day.      No current facility-administered medications on file prior to visit.    Allergies  Allergen Reactions  . Fluorescein Shortness Of Breath and Other (See Comments)    Hypertension  . Iodinated Diagnostic Agents Anaphylaxis, Hives and Other (See Comments)    Diffuse swelling  . Iohexol Anaphylaxis, Hives, Itching, Swelling and Other (See Comments)    13 HR PRE-MEDS REQUIRED  . Latex Itching    Family History  Problem Relation Age of Onset  . Diabetes Maternal Grandmother     BP 134/74 (BP Location: Right Leg, Patient Position: Sitting, Cuff Size: Large)   Pulse 97   Ht 5\' 3"  (1.6 m)    Wt 195 lb (88.5 kg)   SpO2 98%   BMI 34.54 kg/m    Review of Systems     Objective:   Physical Exam VITAL SIGNS:  See vs page GENERAL: no distress Pulses: dorsalis pedis intact bilat.   MSK: no deformity of the feet CV: trace bilat leg edema Skin:  no ulcer on  the feet.  normal color and temp on the feet. Neuro: sensation is intact to touch on the feet  Lab Results  Component Value Date   HGBA1C 12.0 (A) 03/04/2019       Assessment & Plan:  Insulin-requiring type 2 DM, with PDR: worse.  She declines ref CDE, re: continuous glucose monitor. Noncompliance with cbg recording and insulin: she is not a candidate for multiple daily injections.    Patient Instructions  Here is a new meter.  I have sent a prescription to your pharmacy, for strips, and the dexcom G6.  Call if you need help with this. check your blood sugar twice a day.  vary the time of day when you check, between before the 3 meals, and at bedtime.  also check if you have symptoms of your blood sugar being too high or too low.  please keep a record of the readings and bring it to your next appointment here (or you can bring the meter itself).  You can write it on any piece of paper.  please call us sooner if your blood sugar goes below 70, or if you have a lot of readings over 200.   To help you remember the insulin, put the pen next to something you use each day, such as your toothbrush.  This is the next step in your diabetes therapy. On this type of insulin schedule, you should eat meals on a regular schedule.  If a meal is missed or significantly delayed, your blood sugar could go low.   Please continue the same insulin.   Please come back for a follow-up appointment in 2 months.   '

## 2019-03-04 NOTE — Patient Instructions (Addendum)
Here is a new meter.  I have sent a prescription to your pharmacy, for strips, and the dexcom G6.  Call if you need help with this. check your blood sugar twice a day.  vary the time of day when you check, between before the 3 meals, and at bedtime.  also check if you have symptoms of your blood sugar being too high or too low.  please keep a record of the readings and bring it to your next appointment here (or you can bring the meter itself).  You can write it on any piece of paper.  please call us sooner if your blood sugar goes below 70, or if you have a lot of readings over 200.   To help you remember the insulin, put the pen next to something you use each day, such as your toothbrush.  This is the next step in your diabetes therapy. On this type of insulin schedule, you should eat meals on a regular schedule.  If a meal is missed or significantly delayed, your blood sugar could go low.   Please continue the same insulin.   Please come back for a follow-up appointment in 2 months.

## 2019-03-19 ENCOUNTER — Ambulatory Visit (INDEPENDENT_AMBULATORY_CARE_PROVIDER_SITE_OTHER): Payer: BC Managed Care – PPO | Admitting: Podiatry

## 2019-03-19 ENCOUNTER — Encounter: Payer: Self-pay | Admitting: Podiatry

## 2019-03-19 ENCOUNTER — Other Ambulatory Visit: Payer: Self-pay

## 2019-03-19 ENCOUNTER — Ambulatory Visit (INDEPENDENT_AMBULATORY_CARE_PROVIDER_SITE_OTHER): Payer: BC Managed Care – PPO

## 2019-03-19 DIAGNOSIS — S92502A Displaced unspecified fracture of left lesser toe(s), initial encounter for closed fracture: Secondary | ICD-10-CM

## 2019-03-19 DIAGNOSIS — S90122A Contusion of left lesser toe(s) without damage to nail, initial encounter: Secondary | ICD-10-CM

## 2019-03-19 NOTE — Progress Notes (Signed)
She presents today chief complaint of a painful fifth toe left states the back of her the holidays I bumped it and it really started to bother me the last week or 2 and she states that she really does not remember any injury to it other than over the holidays but she says the sore got a bruised appearance and is tender.  Objective: Vital signs stable she alert oriented x3.  Pulses are palpable.  There is mild ecchymosis overlying the fifth toe and fifth metatarsal phalangeal joint area with strong palpable pulses.  Pulses demonstrate what appears to be a plantar condyle of the proximal phalanx fifth digit left with a vertical fracture it does not demonstrate any dislocation or fragmentation.  Assessment fracture toe fifth left.  Plan: Discussed etiology pathology conservative therapies and this point I demonstrated for her how to tape the toe with Coban and dispensed a Darco shoe and I will follow-up with her in a few weeks for reevaluation.

## 2019-04-29 DIAGNOSIS — B349 Viral infection, unspecified: Secondary | ICD-10-CM | POA: Diagnosis not present

## 2019-04-29 DIAGNOSIS — B259 Cytomegaloviral disease, unspecified: Secondary | ICD-10-CM | POA: Diagnosis not present

## 2019-05-01 ENCOUNTER — Other Ambulatory Visit: Payer: Self-pay

## 2019-05-05 ENCOUNTER — Ambulatory Visit (INDEPENDENT_AMBULATORY_CARE_PROVIDER_SITE_OTHER): Payer: BC Managed Care – PPO | Admitting: Podiatry

## 2019-05-05 ENCOUNTER — Ambulatory Visit: Payer: BC Managed Care – PPO | Admitting: Endocrinology

## 2019-05-05 ENCOUNTER — Other Ambulatory Visit: Payer: Self-pay

## 2019-05-05 ENCOUNTER — Ambulatory Visit (INDEPENDENT_AMBULATORY_CARE_PROVIDER_SITE_OTHER): Payer: BC Managed Care – PPO

## 2019-05-05 ENCOUNTER — Encounter: Payer: Self-pay | Admitting: Podiatry

## 2019-05-05 VITALS — Temp 96.2°F

## 2019-05-05 DIAGNOSIS — S92502D Displaced unspecified fracture of left lesser toe(s), subsequent encounter for fracture with routine healing: Secondary | ICD-10-CM

## 2019-05-06 ENCOUNTER — Ambulatory Visit: Payer: BC Managed Care – PPO | Admitting: Endocrinology

## 2019-05-06 NOTE — Progress Notes (Signed)
She presents today states that her fifth toe was doing good until her husband stepped on it by accident.  She is referring to the left foot.  She continues to wear the Darco shoe.  Objective: Vital signs are stable alert oriented x3 pulses are palpable.  She has mild pain and edema overlying the fifth metatarsal head the fifth toe has a piece of Coban wrapped around it and is not swollen and is mildly tender.  Radiographs taken today demonstrate what appears to be a healing fracture in the head of the fifth metatarsal.  Also healing fracture of the proximal phalanx fifth toe.  Assessment: Healing fractures fifth toe fifth met.  Plan: Continue the use of the Darco shoe for another couple of weeks until she can get into regular shoe gear.  Follow-up with me as needed

## 2019-05-07 ENCOUNTER — Other Ambulatory Visit: Payer: Self-pay

## 2019-05-07 ENCOUNTER — Ambulatory Visit (INDEPENDENT_AMBULATORY_CARE_PROVIDER_SITE_OTHER): Payer: BC Managed Care – PPO | Admitting: Endocrinology

## 2019-05-07 DIAGNOSIS — E1122 Type 2 diabetes mellitus with diabetic chronic kidney disease: Secondary | ICD-10-CM | POA: Diagnosis not present

## 2019-05-07 DIAGNOSIS — Z794 Long term (current) use of insulin: Secondary | ICD-10-CM

## 2019-05-07 DIAGNOSIS — Z992 Dependence on renal dialysis: Secondary | ICD-10-CM | POA: Diagnosis not present

## 2019-05-07 DIAGNOSIS — N186 End stage renal disease: Secondary | ICD-10-CM | POA: Diagnosis not present

## 2019-05-07 NOTE — Patient Instructions (Addendum)
It sounds like you need a faster-acting daily insulin.  However, to be sure, we need the A1c first.   check your blood sugar twice a day.  vary the time of day when you check, between before the 3 meals, and at bedtime.  also check if you have symptoms of your blood sugar being too high or too low.  please keep a record of the readings and bring it to your next appointment here (or you can bring the meter itself).  You can write it on any piece of paper.  please call us sooner if your blood sugar goes below 70, or if you have a lot of readings over 200.   On this type of insulin schedule, you should eat meals on a regular schedule.  If a meal is missed or significantly delayed, your blood sugar could go low.   Please continue the same insulin.   Please come back for a follow-up appointment in 2 months.

## 2019-05-07 NOTE — Progress Notes (Signed)
Subjective:    Patient ID: Morgan [name], female    DOB: December 07, 1970, 49 y.o.   MRN: [id number]  HPI  telehealth visit today via doxy video visit.  Alternatives to telehealth are presented to this patient, and the patient agrees to the telehealth visit. Pt is advised of the cost of the visit, and agrees to this, also.   Patient is at home, and I am at the office.   Persons attending the telehealth visit: the patient and I. Pt returns for f/u of diabetes mellitus: DM type: Insulin-requiring type 2 Dx'ed: 1998.  Complications: polyneuropathy, proliferative retinopathy, and ESRD.   Therapy: insulin since dx.   GDM: never.  DKA: never.  Severe hypoglycemia: never.  Pancreatitis: never.   SDOH: She took an intentional insulin overdose in 2014, in a suicide attempt; therapy has been limited by noncompliance, so she is on a QD insulin schedule. Other: she had a renal transplant in 2018; she takes insulin at 10 AM-noon.    Interval history: Pt had several episodes of mild hypoglycemia in the middle of the night.  She says cbg varies from 47-280.  It is in general higher as the day goes on.  She is just starting continuous glucose monitor today.   Past Medical History:  Diagnosis Date  . Anemia    low iron  . Constipation    when given iron  . Diabetes mellitus without complication ([place])    type 2  . Diabetic retinopathy ([place])   . H/O detached retina repair    bilateral  . Hypertension   . Pneumonia   . Renal disorder    dialysis on T/Th/Sa    Past Surgical History:  Procedure Laterality Date  . ARTERIOVENOUS GRAFT PLACEMENT Left 07/2010  . AV FISTULA PLACEMENT Right 11/25/2015   Procedure: INSERTION OF ARTERIOVENOUS (AV) GORE-TEX GRAFT RIGHT  ARM USING 4-7 MM X 45 CM STRETCH GRAFT.;  Surgeon: Angelia Mould, MD;  Location: [facility] OR;  Service: Vascular;  Laterality: Right;  . basilic vein transposition Left 2011  . CESAREAN SECTION    . CHOLECYSTECTOMY N/A 10/08/2014    Procedure: LAPAROSCOPIC CHOLECYSTECTOMY ;  Surgeon: Erroll Luna, MD;  Location: [place];  Service: General;  Laterality: N/A;  . EYE SURGERY     retinal detachment  . REVISION OF ARTERIOVENOUS GORETEX GRAFT Left 05/13/2015   Procedure: REVISION OF ARTERIOVENOUS GORETEX GRAFT;  Surgeon: Angelia Mould, MD;  Location: [place];  Service: Vascular;  Laterality: Left;    Social History   Socioeconomic History  . Marital status: Married    Spouse name: Not on file  . Number of children: Not on file  . Years of education: Not on file  . Highest education level: Not on file  Occupational History  . Not on file  Tobacco Use  . Smoking status: Never Smoker  . Smokeless tobacco: Never Used  Substance and Sexual Activity  . Alcohol use: No    Alcohol/week: 0.0 standard drinks  . Drug use: No  . Sexual activity: Not on file  Other Topics Concern  . Not on file  Social History Narrative  . Not on file   Social Determinants of Health   Financial Resource Strain:   . Difficulty of Paying Living Expenses:   Food Insecurity:   . Worried About Charity fundraiser in the Last Year:   . Arboriculturist in the Last Year:   Transportation Needs:   . Lack  of Transportation (Medical):   Marland Kitchen Lack of Transportation (Non-Medical):   Physical Activity:   . Days of Exercise per Week:   . Minutes of Exercise per Session:   Stress:   . Feeling of Stress :   Social Connections:   . Frequency of Communication with Friends and Family:   . Frequency of Social Gatherings with Friends and Family:   . Attends Religious Services:   . Active Member of Clubs or Organizations:   . Attends Archivist Meetings:   Marland Kitchen Marital Status:   Intimate Partner Violence:   . Fear of Current or Ex-Partner:   . Emotionally Abused:   Marland Kitchen Physically Abused:   . Sexually Abused:     Current Outpatient Medications on File Prior to Visit  Medication Sig Dispense Refill  . amLODipine (NORVASC) 5 MG tablet  Take 5 mg by mouth at bedtime.  11  . aspirin 81 MG tablet Take 81 mg by mouth daily.    . B Complex-C-Folic Acid (RENA-VITE PO) Take 1 tablet by mouth daily.    . Continuous Blood Gluc Receiver (DEXCOM G6 RECEIVER) DEVI APPLY ROUTE ONCE    . Continuous Blood Gluc Sensor (DEXCOM G6 SENSOR) MISC 1 Device by Does not apply route See admin instructions. Change every 10 days 9 each 3  . Continuous Blood Gluc Transmit (DEXCOM G6 TRANSMITTER) MISC USE TO CHECK BLOOD SUGAR AS DIRECTED    . folic acid (FOLVITE) 1 MG tablet Take 1 mg by mouth daily.  0  . glucose blood (ONETOUCH VERIO) test strip 1 each by Other route 2 (two) times daily. And lancets 2/day 100 each 12  . imiquimod (ALDARA) 5 % cream USE THREE TIMES WEEKLY AT BEDTIME. WASH OFF WITH SOAP AND WATER 6 TO 10 HOURS LATER. MAX 16 WEEKS. AVOID SEXUAL CONTACT WHILE CREAM IN ON SK  0  . Insulin Lispro Prot & Lispro (HUMALOG MIX 75/25 KWIKPEN) (75-25) 100 UNIT/ML Kwikpen Inject 60 Units into the skin daily with breakfast. Dosage has been verified and deemed accurate by Dr. Loanne Drilling 10 pen 11  . Insulin Pen Needle (PEN NEEDLES) 32G X 5 MM MISC 1 each by Does not apply route daily. E11.9 90 each 1  . Lancets (ONETOUCH DELICA PLUS JJHERD40C) MISC USE TWICE A DAY TO CHECK BLOOD SUGAR    . levonorgestrel (MIRENA) 20 MCG/24HR IUD 1 each by Intrauterine route once.    . lidocaine-prilocaine (EMLA) cream Apply 1 application topically daily as needed (dialysis).     . mycophenolate (CELLCEPT) 250 MG capsule Take 1,000 mg by mouth 2 (two) times daily.    . polyethylene glycol (MIRALAX / GLYCOLAX) packet Take 17 g by mouth daily.    . predniSONE (DELTASONE) 5 MG tablet Take 5 mg by mouth daily with breakfast.    . ranitidine (ZANTAC) 150 MG tablet Take 150 mg by mouth 2 (two) times daily.    . sevelamer carbonate (RENVELA) 800 MG tablet Take 4,000 mg by mouth 3 (three) times daily with meals.     . tacrolimus (PROGRAF) 1 MG capsule Take 9 mg by mouth 2 (two)  times daily. 5 in the morning .    . valGANciclovir (VALCYTE) 450 MG tablet Take by mouth daily. 1 tab every other day.      No current facility-administered medications on file prior to visit.    Allergies  Allergen Reactions  . Fluorescein Shortness Of Breath and Other (See Comments)    Hypertension  . Iodinated  Diagnostic Agents Anaphylaxis, Hives and Other (See Comments)    Diffuse swelling  . Iohexol Anaphylaxis, Hives, Itching, Swelling and Other (See Comments)    13 HR PRE-MEDS REQUIRED  . Latex Itching    Family History  Problem Relation Age of Onset  . Diabetes Maternal Grandmother     There were no vitals taken for this visit.   Review of Systems Denies LOC    Objective:   Physical Exam       Assessment & Plan:  Insulin-requiring type 2 DM, with PDR: she needs increased rx Hypoglycemia: this limits aggressiveness of glycemic control.  ESRD: she prob needs a faster-acting qam insulin SDOH: she is not a candidate for multiple daily injections.  Patient Instructions  It sounds like you need a faster-acting daily insulin.  However, to be sure, we need the A1c first.   check your blood sugar twice a day.  vary the time of day when you check, between before the 3 meals, and at bedtime.  also check if you have symptoms of your blood sugar being too high or too low.  please keep a record of the readings and bring it to your next appointment here (or you can bring the meter itself).  You can write it on any piece of paper.  please call us sooner if your blood sugar goes below 70, or if you have a lot of readings over 200.   On this type of insulin schedule, you should eat meals on a regular schedule.  If a meal is missed or significantly delayed, your blood sugar could go low.   Please continue the same insulin.   Please come back for a follow-up appointment in 2 months.

## 2019-05-14 DIAGNOSIS — Z1159 Encounter for screening for other viral diseases: Secondary | ICD-10-CM | POA: Diagnosis not present

## 2019-05-14 DIAGNOSIS — Z124 Encounter for screening for malignant neoplasm of cervix: Secondary | ICD-10-CM | POA: Diagnosis not present

## 2019-05-14 DIAGNOSIS — Z1231 Encounter for screening mammogram for malignant neoplasm of breast: Secondary | ICD-10-CM | POA: Diagnosis not present

## 2019-05-14 DIAGNOSIS — Z01419 Encounter for gynecological examination (general) (routine) without abnormal findings: Secondary | ICD-10-CM | POA: Diagnosis not present

## 2019-05-14 DIAGNOSIS — Z114 Encounter for screening for human immunodeficiency virus [HIV]: Secondary | ICD-10-CM | POA: Diagnosis not present

## 2019-05-14 DIAGNOSIS — Z118 Encounter for screening for other infectious and parasitic diseases: Secondary | ICD-10-CM | POA: Diagnosis not present

## 2019-05-14 DIAGNOSIS — Z1151 Encounter for screening for human papillomavirus (HPV): Secondary | ICD-10-CM | POA: Diagnosis not present

## 2019-05-14 DIAGNOSIS — R350 Frequency of micturition: Secondary | ICD-10-CM | POA: Diagnosis not present

## 2019-05-14 DIAGNOSIS — Z113 Encounter for screening for infections with a predominantly sexual mode of transmission: Secondary | ICD-10-CM | POA: Diagnosis not present

## 2019-05-18 DIAGNOSIS — Z01818 Encounter for other preprocedural examination: Secondary | ICD-10-CM | POA: Diagnosis not present

## 2019-05-18 DIAGNOSIS — N900 Mild vulvar dysplasia: Secondary | ICD-10-CM | POA: Diagnosis not present

## 2019-05-18 DIAGNOSIS — D071 Carcinoma in situ of vulva: Secondary | ICD-10-CM | POA: Diagnosis not present

## 2019-05-19 ENCOUNTER — Other Ambulatory Visit: Payer: Self-pay | Admitting: Endocrinology

## 2019-05-27 ENCOUNTER — Telehealth: Payer: Self-pay | Admitting: Endocrinology

## 2019-05-27 NOTE — Telephone Encounter (Signed)
Patient is getting cancer surgery on April 7th and Audrea Muscat from [facility] called to get info on patient's last A1C and what was prescribed to patient after last A1C. She requests call ASAP. [phone]

## 2019-05-27 NOTE — Telephone Encounter (Signed)
Returned [facility] Ann's call. Advised pt was seen virtually and was advised to come in for an A1C to determine proper insulin dosage. To date, pt has failed to call to schedule appt for A1C, therefore, pt remains in a holding pattern re: proper dosage of insulin. Verbalized acceptance and understanding. States she does not need any additional information faxed as she is able to see this information via Care Everywhere.

## 2019-05-29 DIAGNOSIS — E1122 Type 2 diabetes mellitus with diabetic chronic kidney disease: Secondary | ICD-10-CM | POA: Diagnosis not present

## 2019-05-31 DIAGNOSIS — Z01818 Encounter for other preprocedural examination: Secondary | ICD-10-CM | POA: Diagnosis not present

## 2019-05-31 DIAGNOSIS — Z20822 Contact with and (suspected) exposure to covid-19: Secondary | ICD-10-CM | POA: Diagnosis not present

## 2019-06-03 ENCOUNTER — Other Ambulatory Visit: Payer: Self-pay | Admitting: Endocrinology

## 2019-06-03 DIAGNOSIS — Z794 Long term (current) use of insulin: Secondary | ICD-10-CM | POA: Diagnosis not present

## 2019-06-03 DIAGNOSIS — E1165 Type 2 diabetes mellitus with hyperglycemia: Secondary | ICD-10-CM | POA: Diagnosis not present

## 2019-06-05 ENCOUNTER — Other Ambulatory Visit: Payer: Self-pay

## 2019-06-05 ENCOUNTER — Telehealth: Payer: Self-pay | Admitting: Endocrinology

## 2019-06-05 DIAGNOSIS — E1122 Type 2 diabetes mellitus with diabetic chronic kidney disease: Secondary | ICD-10-CM

## 2019-06-05 DIAGNOSIS — Z794 Long term (current) use of insulin: Secondary | ICD-10-CM

## 2019-06-05 MED ORDER — INSULIN LISPRO PROT & LISPRO (75-25 MIX) 100 UNIT/ML KWIKPEN: 60.0000 [IU] | PEN_INJECTOR | Freq: Every day | SUBCUTANEOUS | 11 refills | Status: DC

## 2019-06-05 NOTE — Telephone Encounter (Signed)
Outpatient Medication Detail   Disp Refills Start End   Insulin Lispro Prot & Lispro (HUMALOG MIX 75/25 KWIKPEN) (75-25) 100 UNIT/ML Kwikpen 10 pen 11 06/05/2019    Sig - Route: Inject 60 Units into the skin daily with breakfast. Dosage has been verified and deemed accurate by Dr. Loanne Drilling - Subcutaneous   Sent to pharmacy as: Insulin Lispro Prot & Lispro (HUMALOG MIX 75/25 KWIKPEN) (75-25) 100 UNIT/ML Kwikpen   E-Prescribing Status: Receipt confirmed by pharmacy (06/05/2019 11:52 AM EDT)

## 2019-06-05 NOTE — Telephone Encounter (Signed)
Have you contacted your pharmacy to initiate the refill request? yes           If no then please ask them to call the pharmacy to start the request but if they insist proceed to next questions  What is the name of the medication/medications? 75/25 humalog        If it is for test strips or lancets please confirm the brand  Is this for a 90 day? yes  What is the name and location of the pharmacy you would like to use?    [address], [place] [address] Ph# [phone]

## 2019-06-12 DIAGNOSIS — N2581 Secondary hyperparathyroidism of renal origin: Secondary | ICD-10-CM | POA: Diagnosis not present

## 2019-06-12 DIAGNOSIS — Z940 Kidney transplant status: Secondary | ICD-10-CM | POA: Diagnosis not present

## 2019-06-12 DIAGNOSIS — N189 Chronic kidney disease, unspecified: Secondary | ICD-10-CM | POA: Diagnosis not present

## 2019-06-12 DIAGNOSIS — D631 Anemia in chronic kidney disease: Secondary | ICD-10-CM | POA: Diagnosis not present

## 2019-06-23 DIAGNOSIS — A630 Anogenital (venereal) warts: Secondary | ICD-10-CM | POA: Diagnosis not present

## 2019-06-29 ENCOUNTER — Other Ambulatory Visit: Payer: Self-pay | Admitting: Endocrinology

## 2019-07-10 DIAGNOSIS — Z940 Kidney transplant status: Secondary | ICD-10-CM | POA: Diagnosis not present

## 2019-07-10 DIAGNOSIS — E1122 Type 2 diabetes mellitus with diabetic chronic kidney disease: Secondary | ICD-10-CM | POA: Diagnosis not present

## 2019-07-10 DIAGNOSIS — N186 End stage renal disease: Secondary | ICD-10-CM | POA: Diagnosis not present

## 2019-07-10 DIAGNOSIS — Z5181 Encounter for therapeutic drug level monitoring: Secondary | ICD-10-CM | POA: Diagnosis not present

## 2019-07-10 DIAGNOSIS — Z992 Dependence on renal dialysis: Secondary | ICD-10-CM | POA: Diagnosis not present

## 2019-07-10 DIAGNOSIS — Z794 Long term (current) use of insulin: Secondary | ICD-10-CM | POA: Diagnosis not present

## 2019-07-11 LAB — HEMOGLOBIN A1C
Est. average glucose Bld gHb Est-mCnc: 177 mg/dL
Hgb A1c MFr Bld: 7.8 % — ABNORMAL HIGH (ref 4.8–5.6)

## 2019-07-13 ENCOUNTER — Telehealth: Payer: Self-pay

## 2019-07-13 DIAGNOSIS — Z01818 Encounter for other preprocedural examination: Secondary | ICD-10-CM | POA: Diagnosis not present

## 2019-07-13 DIAGNOSIS — Z20822 Contact with and (suspected) exposure to covid-19: Secondary | ICD-10-CM | POA: Diagnosis not present

## 2019-07-13 NOTE — Telephone Encounter (Signed)
LAB RESULTS  Lab results were reviewed by Dr. Loanne Drilling. A letter has been mailed to pt home address requesting she call to schedule a virtual visit appt to further discuss. For future reference, letter can be found in [place].

## 2019-07-13 NOTE — Telephone Encounter (Signed)
-----   Message from Renato Shin, MD sent at 07/11/2019  8:55 AM EDT ----- please contact patient: F/u is due.  Vv is OK

## 2019-07-15 DIAGNOSIS — Z794 Long term (current) use of insulin: Secondary | ICD-10-CM | POA: Diagnosis not present

## 2019-07-15 DIAGNOSIS — E1122 Type 2 diabetes mellitus with diabetic chronic kidney disease: Secondary | ICD-10-CM | POA: Diagnosis not present

## 2019-07-15 DIAGNOSIS — N186 End stage renal disease: Secondary | ICD-10-CM | POA: Diagnosis not present

## 2019-07-15 DIAGNOSIS — Z7982 Long term (current) use of aspirin: Secondary | ICD-10-CM | POA: Diagnosis not present

## 2019-07-15 DIAGNOSIS — I739 Peripheral vascular disease, unspecified: Secondary | ICD-10-CM | POA: Diagnosis not present

## 2019-07-15 DIAGNOSIS — Z86008 Personal history of in-situ neoplasm of other site: Secondary | ICD-10-CM | POA: Diagnosis not present

## 2019-07-15 DIAGNOSIS — R87613 High grade squamous intraepithelial lesion on cytologic smear of cervix (HGSIL): Secondary | ICD-10-CM | POA: Diagnosis not present

## 2019-07-15 DIAGNOSIS — Z940 Kidney transplant status: Secondary | ICD-10-CM | POA: Diagnosis not present

## 2019-07-15 DIAGNOSIS — I120 Hypertensive chronic kidney disease with stage 5 chronic kidney disease or end stage renal disease: Secondary | ICD-10-CM | POA: Diagnosis not present

## 2019-07-15 DIAGNOSIS — Z91040 Latex allergy status: Secondary | ICD-10-CM | POA: Diagnosis not present

## 2019-07-15 DIAGNOSIS — Z79899 Other long term (current) drug therapy: Secondary | ICD-10-CM | POA: Diagnosis not present

## 2019-07-15 DIAGNOSIS — N2581 Secondary hyperparathyroidism of renal origin: Secondary | ICD-10-CM | POA: Diagnosis not present

## 2019-07-15 DIAGNOSIS — A630 Anogenital (venereal) warts: Secondary | ICD-10-CM | POA: Diagnosis not present

## 2019-07-15 DIAGNOSIS — Z992 Dependence on renal dialysis: Secondary | ICD-10-CM | POA: Diagnosis not present

## 2019-07-15 DIAGNOSIS — K219 Gastro-esophageal reflux disease without esophagitis: Secondary | ICD-10-CM | POA: Diagnosis not present

## 2019-07-15 DIAGNOSIS — D84821 Immunodeficiency due to drugs: Secondary | ICD-10-CM | POA: Diagnosis not present

## 2019-07-15 DIAGNOSIS — R600 Localized edema: Secondary | ICD-10-CM | POA: Diagnosis not present

## 2019-07-15 DIAGNOSIS — C519 Malignant neoplasm of vulva, unspecified: Secondary | ICD-10-CM | POA: Diagnosis not present

## 2019-07-15 DIAGNOSIS — D071 Carcinoma in situ of vulva: Secondary | ICD-10-CM | POA: Diagnosis not present

## 2019-07-29 DIAGNOSIS — Z940 Kidney transplant status: Secondary | ICD-10-CM | POA: Diagnosis not present

## 2019-08-03 DIAGNOSIS — D849 Immunodeficiency, unspecified: Secondary | ICD-10-CM | POA: Diagnosis not present

## 2019-08-03 DIAGNOSIS — D071 Carcinoma in situ of vulva: Secondary | ICD-10-CM | POA: Diagnosis not present

## 2019-08-22 ENCOUNTER — Telehealth: Payer: Self-pay | Admitting: Endocrinology

## 2019-08-23 NOTE — Telephone Encounter (Signed)
1.  Please schedule f/u appt 2.  Then please refill x 2 mos, pending that appt.  

## 2019-08-26 ENCOUNTER — Other Ambulatory Visit: Payer: Self-pay

## 2019-08-26 ENCOUNTER — Encounter: Payer: Self-pay | Admitting: Endocrinology

## 2019-08-26 ENCOUNTER — Ambulatory Visit (INDEPENDENT_AMBULATORY_CARE_PROVIDER_SITE_OTHER): Payer: BC Managed Care – PPO | Admitting: Endocrinology

## 2019-08-26 DIAGNOSIS — Z992 Dependence on renal dialysis: Secondary | ICD-10-CM | POA: Diagnosis not present

## 2019-08-26 DIAGNOSIS — E1122 Type 2 diabetes mellitus with diabetic chronic kidney disease: Secondary | ICD-10-CM

## 2019-08-26 DIAGNOSIS — Z794 Long term (current) use of insulin: Secondary | ICD-10-CM

## 2019-08-26 DIAGNOSIS — N186 End stage renal disease: Secondary | ICD-10-CM

## 2019-08-26 MED ORDER — PEN NEEDLES 32G X 5 MM MISC: 1.0000 | Freq: Two times a day (BID) | 3 refills | Status: DC

## 2019-08-26 MED ORDER — INSULIN LISPRO PROT & LISPRO (75-25 MIX) 100 UNIT/ML KWIKPEN: PEN_INJECTOR | SUBCUTANEOUS | 11 refills | Status: DC

## 2019-08-26 MED ORDER — DEXCOM G6 TRANSMITTER MISC: 1.0000 | Freq: Once | 1 refills | Status: DC

## 2019-08-26 NOTE — Patient Instructions (Addendum)
check your blood sugar twice a day.  vary the time of day when you check, between before the 3 meals, and at bedtime.  also check if you have symptoms of your blood sugar being too high or too low.  please keep a record of the readings and bring it to your next appointment here (or you can bring the meter itself).  You can write it on any piece of paper.  please call us sooner if your blood sugar goes below 70, or if you have a lot of readings over 200.   On this type of insulin schedule, you should eat meals on a regular schedule.  If a meal is missed or significantly delayed, your blood sugar could go low.   Please continue the same insulin.   Please come back for a follow-up appointment in 3 months.

## 2019-08-26 NOTE — Telephone Encounter (Signed)
Follow up scheduled for today at 2:45.

## 2019-08-26 NOTE — Telephone Encounter (Signed)
Will address refill requests during today's appt

## 2019-08-26 NOTE — Progress Notes (Signed)
Subjective:    Patient ID: Morgan [name], female    DOB: 1970-11-26, 49 y.o.   MRN: [id number]  HPI Pt returns for f/u of diabetes mellitus: DM type: Insulin-requiring type 2 Dx'ed: 1998.  Complications: PN, PDR, and ESRD.   Therapy: insulin since dx.   GDM: never.  DKA: never.  Severe hypoglycemia: never.  Pancreatitis: never.   SDOH: She took an intentional insulin overdose in 2014, in a suicide attempt; therapy has been limited by noncompliance, so she is on a BID insulin schedule; pt's constant sarcasm/indifference/ towards staff also compromises rx. Other: she had a renal transplant in 2018; she takes insulin at 10 AM-noon.    Interval history: Pt takes 45 units units in the morning and 15 units in the evening.  glucose varies from 70-220.   I reviewed continuous glucose monitor data today.  The graph is scanned into the record.  Pt says she has mild hypoglycemia approx 3-4 times per week.  This can happen at any time of day.   Past Medical History:  Diagnosis Date  . Anemia    low iron  . Constipation    when given iron  . Diabetes mellitus without complication ([place])    type 2  . Diabetic retinopathy ([place])   . H/O detached retina repair    bilateral  . Hypertension   . Pneumonia   . Renal disorder    dialysis on T/Th/Sa    Past Surgical History:  Procedure Laterality Date  . ARTERIOVENOUS GRAFT PLACEMENT Left 07/2010  . AV FISTULA PLACEMENT Right 11/25/2015   Procedure: INSERTION OF ARTERIOVENOUS (AV) GORE-TEX GRAFT RIGHT  ARM USING 4-7 MM X 45 CM STRETCH GRAFT.;  Surgeon: Angelia Mould, MD;  Location: [facility] OR;  Service: Vascular;  Laterality: Right;  . basilic vein transposition Left 2011  . CESAREAN SECTION    . CHOLECYSTECTOMY N/A 10/08/2014   Procedure: LAPAROSCOPIC CHOLECYSTECTOMY ;  Surgeon: Erroll Luna, MD;  Location: [place];  Service: General;  Laterality: N/A;  . EYE SURGERY     retinal detachment  . REVISION OF ARTERIOVENOUS GORETEX GRAFT  Left 05/13/2015   Procedure: REVISION OF ARTERIOVENOUS GORETEX GRAFT;  Surgeon: Angelia Mould, MD;  Location: [place];  Service: Vascular;  Laterality: Left;    Social History   Socioeconomic History  . Marital status: Married    Spouse name: Not on file  . Number of children: Not on file  . Years of education: Not on file  . Highest education level: Not on file  Occupational History  . Not on file  Tobacco Use  . Smoking status: Never Smoker  . Smokeless tobacco: Never Used  Substance and Sexual Activity  . Alcohol use: No    Alcohol/week: 0.0 standard drinks  . Drug use: No  . Sexual activity: Not on file  Other Topics Concern  . Not on file  Social History Narrative  . Not on file   Social Determinants of Health   Financial Resource Strain:   . Difficulty of Paying Living Expenses:   Food Insecurity:   . Worried About Charity fundraiser in the Last Year:   . Arboriculturist in the Last Year:   Transportation Needs:   . Film/video editor (Medical):   Marland Kitchen Lack of Transportation (Non-Medical):   Physical Activity:   . Days of Exercise per Week:   . Minutes of Exercise per Session:   Stress:   . Feeling of  Stress :   Social Connections:   . Frequency of Communication with Friends and Family:   . Frequency of Social Gatherings with Friends and Family:   . Attends Religious Services:   . Active Member of Clubs or Organizations:   . Attends Archivist Meetings:   Marland Kitchen Marital Status:   Intimate Partner Violence:   . Fear of Current or Ex-Partner:   . Emotionally Abused:   Marland Kitchen Physically Abused:   . Sexually Abused:     Current Outpatient Medications on File Prior to Visit  Medication Sig Dispense Refill  . amLODipine (NORVASC) 5 MG tablet Take 5 mg by mouth at bedtime.  11  . aspirin 81 MG tablet Take 81 mg by mouth daily.    . B Complex-C-Folic Acid (RENA-VITE PO) Take 1 tablet by mouth daily.    . Continuous Blood Gluc Receiver (DEXCOM G6  RECEIVER) DEVI APPLY ROUTE ONCE    . Continuous Blood Gluc Sensor (DEXCOM G6 SENSOR) MISC 1 Device by Does not apply route See admin instructions. Change every 10 days 9 each 3  . folic acid (FOLVITE) 1 MG tablet Take 1 mg by mouth daily.  0  . glucose blood (ONETOUCH VERIO) test strip 1 each by Other route 2 (two) times daily. And lancets 2/day 100 each 12  . imiquimod (ALDARA) 5 % cream USE THREE TIMES WEEKLY AT BEDTIME. WASH OFF WITH SOAP AND WATER 6 TO 10 HOURS LATER. MAX 16 WEEKS. AVOID SEXUAL CONTACT WHILE CREAM IN ON SK  0  . Lancets (ONETOUCH DELICA PLUS [id number]) MISC USE TWICE A DAY TO CHECK BLOOD SUGAR    . levonorgestrel (MIRENA) 20 MCG/24HR IUD 1 each by Intrauterine route once.    . lidocaine-prilocaine (EMLA) cream Apply 1 application topically daily as needed (dialysis).     . mycophenolate (CELLCEPT) 250 MG capsule Take 1,000 mg by mouth 2 (two) times daily.    . polyethylene glycol (MIRALAX / GLYCOLAX) packet Take 17 g by mouth daily.    . predniSONE (DELTASONE) 5 MG tablet Take 5 mg by mouth daily with breakfast.    . ranitidine (ZANTAC) 150 MG tablet Take 150 mg by mouth 2 (two) times daily.    . sevelamer carbonate (RENVELA) 800 MG tablet Take 4,000 mg by mouth 3 (three) times daily with meals.     . tacrolimus (PROGRAF) 1 MG capsule Take 9 mg by mouth 2 (two) times daily. 5 in the morning .    . valGANciclovir (VALCYTE) 450 MG tablet Take by mouth daily. 1 tab every other day.      No current facility-administered medications on file prior to visit.    Allergies  Allergen Reactions  . Fluorescein Shortness Of Breath and Other (See Comments)    Hypertension  . Iodinated Diagnostic Agents Anaphylaxis, Hives and Other (See Comments)    Diffuse swelling  . Iohexol Anaphylaxis, Hives, Itching, Swelling and Other (See Comments)    13 HR PRE-MEDS REQUIRED  . Latex Itching    Family History  Problem Relation Age of Onset  . Diabetes Maternal Grandmother     BP 112/86    Pulse (!) 114   Ht 5\' 3"  (1.6 m)   Wt 198 lb 9.6 oz (90.1 kg)   SpO2 93%   BMI 35.18 kg/m    Review of Systems Denies LOC.     Objective:   Physical Exam VITAL SIGNS:  See vs page GENERAL: no distress Pulses: dorsalis pedis intact bilat.  MSK: no deformity of the feet CV: no leg edema Skin:  no ulcer on the feet.  normal color and temp on the feet. Neuro: sensation is intact to touch on the feet.     Lab Results  Component Value Date   HGBA1C 7.8 (H) 07/10/2019       Assessment & Plan:  Insulin-requiring type 2 DM, with ESRD. Hypoglycemia, due to insulin: this limits aggressiveness of glycemic control.    Patient Instructions  check your blood sugar twice a day.  vary the time of day when you check, between before the 3 meals, and at bedtime.  also check if you have symptoms of your blood sugar being too high or too low.  please keep a record of the readings and bring it to your next appointment here (or you can bring the meter itself).  You can write it on any piece of paper.  please call us sooner if your blood sugar goes below 70, or if you have a lot of readings over 200.   On this type of insulin schedule, you should eat meals on a regular schedule.  If a meal is missed or significantly delayed, your blood sugar could go low.   Please continue the same insulin.   Please come back for a follow-up appointment in 3 months.

## 2019-12-16 DIAGNOSIS — N189 Chronic kidney disease, unspecified: Secondary | ICD-10-CM | POA: Diagnosis not present

## 2019-12-16 DIAGNOSIS — N2581 Secondary hyperparathyroidism of renal origin: Secondary | ICD-10-CM | POA: Diagnosis not present

## 2019-12-16 DIAGNOSIS — Z940 Kidney transplant status: Secondary | ICD-10-CM | POA: Diagnosis not present

## 2019-12-16 DIAGNOSIS — D631 Anemia in chronic kidney disease: Secondary | ICD-10-CM | POA: Diagnosis not present

## 2020-01-05 DIAGNOSIS — A630 Anogenital (venereal) warts: Secondary | ICD-10-CM | POA: Diagnosis not present

## 2020-01-06 ENCOUNTER — Other Ambulatory Visit: Payer: Self-pay | Admitting: Endocrinology

## 2020-01-06 DIAGNOSIS — E1122 Type 2 diabetes mellitus with diabetic chronic kidney disease: Secondary | ICD-10-CM

## 2020-01-06 DIAGNOSIS — Z794 Long term (current) use of insulin: Secondary | ICD-10-CM

## 2020-01-25 ENCOUNTER — Other Ambulatory Visit: Payer: Self-pay | Admitting: Endocrinology

## 2020-01-25 DIAGNOSIS — Z794 Long term (current) use of insulin: Secondary | ICD-10-CM

## 2020-02-01 DIAGNOSIS — D071 Carcinoma in situ of vulva: Secondary | ICD-10-CM | POA: Diagnosis not present

## 2020-02-01 DIAGNOSIS — D849 Immunodeficiency, unspecified: Secondary | ICD-10-CM | POA: Diagnosis not present

## 2020-02-01 DIAGNOSIS — Z23 Encounter for immunization: Secondary | ICD-10-CM | POA: Diagnosis not present

## 2020-02-03 DIAGNOSIS — Z9049 Acquired absence of other specified parts of digestive tract: Secondary | ICD-10-CM | POA: Diagnosis not present

## 2020-02-03 DIAGNOSIS — N186 End stage renal disease: Secondary | ICD-10-CM | POA: Diagnosis not present

## 2020-02-03 DIAGNOSIS — Z794 Long term (current) use of insulin: Secondary | ICD-10-CM | POA: Diagnosis not present

## 2020-02-03 DIAGNOSIS — G4719 Other hypersomnia: Secondary | ICD-10-CM | POA: Diagnosis not present

## 2020-02-03 DIAGNOSIS — Z7952 Long term (current) use of systemic steroids: Secondary | ICD-10-CM | POA: Diagnosis not present

## 2020-02-03 DIAGNOSIS — E1122 Type 2 diabetes mellitus with diabetic chronic kidney disease: Secondary | ICD-10-CM | POA: Diagnosis not present

## 2020-02-03 DIAGNOSIS — Z940 Kidney transplant status: Secondary | ICD-10-CM | POA: Diagnosis not present

## 2020-02-03 DIAGNOSIS — R251 Tremor, unspecified: Secondary | ICD-10-CM | POA: Diagnosis not present

## 2020-02-03 DIAGNOSIS — I120 Hypertensive chronic kidney disease with stage 5 chronic kidney disease or end stage renal disease: Secondary | ICD-10-CM | POA: Diagnosis not present

## 2020-02-03 DIAGNOSIS — D84821 Immunodeficiency due to drugs: Secondary | ICD-10-CM | POA: Diagnosis not present

## 2020-02-03 DIAGNOSIS — Z992 Dependence on renal dialysis: Secondary | ICD-10-CM | POA: Diagnosis not present

## 2020-02-03 DIAGNOSIS — E1165 Type 2 diabetes mellitus with hyperglycemia: Secondary | ICD-10-CM | POA: Diagnosis not present

## 2020-02-03 DIAGNOSIS — K5900 Constipation, unspecified: Secondary | ICD-10-CM | POA: Diagnosis not present

## 2020-02-03 DIAGNOSIS — D849 Immunodeficiency, unspecified: Secondary | ICD-10-CM | POA: Diagnosis not present

## 2020-02-03 DIAGNOSIS — Z4822 Encounter for aftercare following kidney transplant: Secondary | ICD-10-CM | POA: Diagnosis not present

## 2020-03-02 ENCOUNTER — Ambulatory Visit: Payer: BC Managed Care – PPO | Admitting: Endocrinology

## 2020-03-05 ENCOUNTER — Other Ambulatory Visit: Payer: Self-pay | Admitting: Endocrinology

## 2020-03-05 DIAGNOSIS — E1122 Type 2 diabetes mellitus with diabetic chronic kidney disease: Secondary | ICD-10-CM

## 2020-03-05 DIAGNOSIS — Z794 Long term (current) use of insulin: Secondary | ICD-10-CM

## 2020-03-06 NOTE — Telephone Encounter (Signed)
1.  Please schedule f/u appt 2.  Then please refill x 2 mos, pending that appt.  

## 2020-03-17 ENCOUNTER — Other Ambulatory Visit: Payer: Self-pay | Admitting: Endocrinology

## 2020-03-17 DIAGNOSIS — H4720 Unspecified optic atrophy: Secondary | ICD-10-CM | POA: Diagnosis not present

## 2020-03-17 DIAGNOSIS — E113599 Type 2 diabetes mellitus with proliferative diabetic retinopathy without macular edema, unspecified eye: Secondary | ICD-10-CM | POA: Diagnosis not present

## 2020-03-17 DIAGNOSIS — Z961 Presence of intraocular lens: Secondary | ICD-10-CM | POA: Diagnosis not present

## 2020-03-17 DIAGNOSIS — H35341 Macular cyst, hole, or pseudohole, right eye: Secondary | ICD-10-CM | POA: Diagnosis not present

## 2020-03-21 ENCOUNTER — Telehealth: Payer: BC Managed Care – PPO | Admitting: Endocrinology

## 2020-03-22 DIAGNOSIS — R0683 Snoring: Secondary | ICD-10-CM | POA: Diagnosis not present

## 2020-03-22 DIAGNOSIS — I10 Essential (primary) hypertension: Secondary | ICD-10-CM | POA: Diagnosis not present

## 2020-03-22 DIAGNOSIS — G4733 Obstructive sleep apnea (adult) (pediatric): Secondary | ICD-10-CM | POA: Diagnosis not present

## 2020-04-06 DIAGNOSIS — Z940 Kidney transplant status: Secondary | ICD-10-CM | POA: Diagnosis not present

## 2020-04-06 DIAGNOSIS — D631 Anemia in chronic kidney disease: Secondary | ICD-10-CM | POA: Diagnosis not present

## 2020-04-06 DIAGNOSIS — N189 Chronic kidney disease, unspecified: Secondary | ICD-10-CM | POA: Diagnosis not present

## 2020-04-06 DIAGNOSIS — N2581 Secondary hyperparathyroidism of renal origin: Secondary | ICD-10-CM | POA: Diagnosis not present

## 2020-04-08 DIAGNOSIS — N186 End stage renal disease: Secondary | ICD-10-CM | POA: Diagnosis not present

## 2020-04-08 DIAGNOSIS — N189 Chronic kidney disease, unspecified: Secondary | ICD-10-CM | POA: Diagnosis not present

## 2020-04-08 DIAGNOSIS — I739 Peripheral vascular disease, unspecified: Secondary | ICD-10-CM | POA: Diagnosis not present

## 2020-04-08 DIAGNOSIS — I10 Essential (primary) hypertension: Secondary | ICD-10-CM | POA: Diagnosis not present

## 2020-04-19 ENCOUNTER — Other Ambulatory Visit: Payer: Self-pay | Admitting: Endocrinology

## 2020-05-09 ENCOUNTER — Other Ambulatory Visit: Payer: Self-pay | Admitting: Endocrinology

## 2020-05-09 DIAGNOSIS — E1122 Type 2 diabetes mellitus with diabetic chronic kidney disease: Secondary | ICD-10-CM

## 2020-05-09 DIAGNOSIS — N186 End stage renal disease: Secondary | ICD-10-CM

## 2020-05-09 DIAGNOSIS — Z794 Long term (current) use of insulin: Secondary | ICD-10-CM

## 2020-05-09 NOTE — Telephone Encounter (Signed)
Pt called back just wondering when this would be refilled since she is 3 days without insulin. Pt requests a message be sent on MyChart when this refill has been sent so she doesn't have to keep contacting us or her pharmacy.

## 2020-05-09 NOTE — Telephone Encounter (Signed)
MEDICATION:   Insulin Lispro Prot & Lispro (HUMALOG MIX 75/25 KWIKPEN) (75-25) 100 UNIT/ML [employer]  PHARMACY:   [facility], [place] Phone:  [phone]  Fax:  [phone]      HAS THE PATIENT CONTACTED THEIR PHARMACY?  Yes-Patient states PHARM sent RX request last week  IS THIS A 90 DAY SUPPLY : No  IS PATIENT OUT OF MEDICATION: Yes  IF NOT; HOW MUCH IS LEFT: 0  LAST APPOINTMENT DATE: '@2'$ /22/2022  NEXT APPOINTMENT DATE:'@3'$ /15/2022  DO WE HAVE YOUR PERMISSION TO LEAVE A DETAILED MESSAGE?: Yes  OTHER COMMENTS:    **Let patient know to contact pharmacy at the end of the day to make sure medication is ready. **  ** Please notify patient to allow 48-72 hours to process**  **Encourage patient to contact the pharmacy for refills or they can request refills through [facility]**

## 2020-05-10 ENCOUNTER — Other Ambulatory Visit: Payer: Self-pay

## 2020-05-10 ENCOUNTER — Telehealth (INDEPENDENT_AMBULATORY_CARE_PROVIDER_SITE_OTHER): Payer: BC Managed Care – PPO | Admitting: Endocrinology

## 2020-05-10 DIAGNOSIS — Z992 Dependence on renal dialysis: Secondary | ICD-10-CM

## 2020-05-10 DIAGNOSIS — N186 End stage renal disease: Secondary | ICD-10-CM | POA: Diagnosis not present

## 2020-05-10 DIAGNOSIS — Z794 Long term (current) use of insulin: Secondary | ICD-10-CM

## 2020-05-10 DIAGNOSIS — E1122 Type 2 diabetes mellitus with diabetic chronic kidney disease: Secondary | ICD-10-CM | POA: Diagnosis not present

## 2020-05-10 MED ORDER — INSULIN LISPRO PROT & LISPRO (75-25 MIX) 100 UNIT/ML KWIKPEN: PEN_INJECTOR | SUBCUTANEOUS | 1 refills | Status: DC

## 2020-05-10 NOTE — Progress Notes (Signed)
Subjective:    Patient ID: Morgan [name], female    DOB: 1970-09-13, 50 y.o.   MRN: [id number]  HPI telehealth visit today via video visit.  Alternatives to telehealth are presented to this patient, and the patient agrees to the telehealth visit. Pt is advised of the cost of the visit, and agrees to this, also.   Patient is at home, and I am at the office.   Persons attending the telehealth visit: the patient and I Pt returns for f/u of diabetes mellitus: DM type: Insulin-requiring type 2 Dx'ed: 1998.  Complications: PN, PDR, and ESRD.   Therapy: insulin since dx.   GDM: never.  DKA: never.  Severe hypoglycemia: never.  Pancreatitis: never.   SDOH: She took an intentional insulin overdose in 2014, in a suicide attempt; therapy has been limited by noncompliance, so she is on a BID insulin schedule; pt's constant sarcasm/indifference/ towards staff also compromises rx.   Other: she had a renal transplant in 2018; she takes insulin at 10 AM-noon.   Interval history: Pt takes 25 units units in the morning and 35 units in the evening.  Pt says glucose varies from 40-310.  Pt says she has mild hypoglycemia daily, when she was on a diet, which she has recently stopped.  This can happen at any time of day.  She has been out of insulin x 3 days. Pt says she will see a new diabetes specialist at [facility] on 06/03/20.   Past Medical History:  Diagnosis Date  . Anemia    low iron  . Constipation    when given iron  . Diabetes mellitus without complication ([place])    type 2  . Diabetic retinopathy ([place])   . H/O detached retina repair    bilateral  . Hypertension   . Pneumonia   . Renal disorder    dialysis on T/Th/Sa    Past Surgical History:  Procedure Laterality Date  . ARTERIOVENOUS GRAFT PLACEMENT Left 07/2010  . AV FISTULA PLACEMENT Right 11/25/2015   Procedure: INSERTION OF ARTERIOVENOUS (AV) GORE-TEX GRAFT RIGHT  ARM USING 4-7 MM X 45 CM STRETCH GRAFT.;  Surgeon: Angelia Mould, MD;  Location: [facility] OR;  Service: Vascular;  Laterality: Right;  . basilic vein transposition Left 2011  . CESAREAN SECTION    . CHOLECYSTECTOMY N/A 10/08/2014   Procedure: LAPAROSCOPIC CHOLECYSTECTOMY ;  Surgeon: Erroll Luna, MD;  Location: [place];  Service: General;  Laterality: N/A;  . EYE SURGERY     retinal detachment  . REVISION OF ARTERIOVENOUS GORETEX GRAFT Left 05/13/2015   Procedure: REVISION OF ARTERIOVENOUS GORETEX GRAFT;  Surgeon: Angelia Mould, MD;  Location: [place];  Service: Vascular;  Laterality: Left;    Social History   Socioeconomic History  . Marital status: Married    Spouse name: Not on file  . Number of children: Not on file  . Years of education: Not on file  . Highest education level: Not on file  Occupational History  . Not on file  Tobacco Use  . Smoking status: Never Smoker  . Smokeless tobacco: Never Used  Substance and Sexual Activity  . Alcohol use: No    Alcohol/week: 0.0 standard drinks  . Drug use: No  . Sexual activity: Not on file  Other Topics Concern  . Not on file  Social History Narrative  . Not on file   Social Determinants of Health   Financial Resource Strain: Not on file  Food Insecurity:  Not on file  Transportation Needs: Not on file  Physical Activity: Not on file  Stress: Not on file  Social Connections: Not on file  Intimate Partner Violence: Not on file    Current Outpatient Medications on File Prior to Visit  Medication Sig Dispense Refill  . amLODipine (NORVASC) 5 MG tablet Take 5 mg by mouth at bedtime.  11  . aspirin 81 MG tablet Take 81 mg by mouth daily.    . B Complex-C-Folic Acid (RENA-VITE PO) Take 1 tablet by mouth daily.    . Continuous Blood Gluc Receiver (DEXCOM G6 RECEIVER) DEVI APPLY ROUTE ONCE    . Continuous Blood Gluc Sensor (DEXCOM G6 SENSOR) MISC CHANGE SENSOR EVERY 10 DAYS 3 each 0  . Continuous Blood Gluc Transmit (DEXCOM G6 TRANSMITTER) MISC USE TO CHECK BLOOD SUGAR AS DIRECTED 1  each 0  . folic acid (FOLVITE) 1 MG tablet Take 1 mg by mouth daily.  0  . glucose blood (ONETOUCH VERIO) test strip 1 each by Other route 2 (two) times daily. And lancets 2/day 100 each 12  . imiquimod (ALDARA) 5 % cream USE THREE TIMES WEEKLY AT BEDTIME. WASH OFF WITH SOAP AND WATER 6 TO 10 HOURS LATER. MAX 16 WEEKS. AVOID SEXUAL CONTACT WHILE CREAM IN ON SK  0  . Insulin Pen Needle (PEN NEEDLES) 32G X 5 MM MISC 1 each by Does not apply route in the morning and at bedtime. 180 each 3  . Lancets (ONETOUCH DELICA PLUS [id number]) MISC USE TWICE A DAY TO CHECK BLOOD SUGAR    . levonorgestrel (MIRENA) 20 MCG/24HR IUD 1 each by Intrauterine route once.    . lidocaine-prilocaine (EMLA) cream Apply 1 application topically daily as needed (dialysis).     . mycophenolate (CELLCEPT) 250 MG capsule Take 1,000 mg by mouth 2 (two) times daily.    . polyethylene glycol (MIRALAX / GLYCOLAX) packet Take 17 g by mouth daily.    . predniSONE (DELTASONE) 5 MG tablet Take 5 mg by mouth daily with breakfast.    . ranitidine (ZANTAC) 150 MG tablet Take 150 mg by mouth 2 (two) times daily.    . sevelamer carbonate (RENVELA) 800 MG tablet Take 4,000 mg by mouth 3 (three) times daily with meals.     . tacrolimus (PROGRAF) 1 MG capsule Take 9 mg by mouth 2 (two) times daily. 5 in the morning .    . valGANciclovir (VALCYTE) 450 MG tablet Take by mouth daily. 1 tab every other day.     No current facility-administered medications on file prior to visit.    Allergies  Allergen Reactions  . Fluorescein Shortness Of Breath and Other (See Comments)    Hypertension  . Iodinated Diagnostic Agents Anaphylaxis, Hives and Other (See Comments)    Diffuse swelling  . Iohexol Anaphylaxis, Hives, Itching, Swelling and Other (See Comments)    13 HR PRE-MEDS REQUIRED  . Latex Itching    Family History  Problem Relation Age of Onset  . Diabetes Maternal Grandmother     There were no vitals taken for this visit.   Review  of Systems     Objective:   Physical Exam       Assessment & Plan:  Insulin-requiring type 2 DM, with ESRD: uncontrolled.  therapy limited by noncompliance.   Patient Instructions  check your blood sugar twice a day.  vary the time of day when you check, between before the 3 meals, and at bedtime.  also check  if you have symptoms of your blood sugar being too high or too low.  please keep a record of the readings and bring it to your next appointment here (or you can bring the meter itself).  You can write it on any piece of paper.  please call us sooner if your blood sugar goes below 70, or if you have a lot of readings over 200.   On this type of insulin schedule, you should eat meals on a regular schedule.  If a meal is missed or significantly delayed, your blood sugar could go low.   Please continue the same humalog 75/25 Best wishes with your new doctor at [facility] Please come back for a follow-up appointment in 3 months.

## 2020-05-10 NOTE — Patient Instructions (Addendum)
check your blood sugar twice a day.  vary the time of day when you check, between before the 3 meals, and at bedtime.  also check if you have symptoms of your blood sugar being too high or too low.  please keep a record of the readings and bring it to your next appointment here (or you can bring the meter itself).  You can write it on any piece of paper.  please call us sooner if your blood sugar goes below 70, or if you have a lot of readings over 200.   On this type of insulin schedule, you should eat meals on a regular schedule.  If a meal is missed or significantly delayed, your blood sugar could go low.   Please continue the same humalog 75/25 Best wishes with your new doctor at [facility] Please come back for a follow-up appointment in 3 months.

## 2020-05-20 ENCOUNTER — Other Ambulatory Visit: Payer: Self-pay | Admitting: Endocrinology

## 2020-06-01 DIAGNOSIS — H524 Presbyopia: Secondary | ICD-10-CM | POA: Diagnosis not present

## 2020-06-20 DIAGNOSIS — Z6835 Body mass index (BMI) 35.0-35.9, adult: Secondary | ICD-10-CM | POA: Diagnosis not present

## 2020-06-20 DIAGNOSIS — N951 Menopausal and female climacteric states: Secondary | ICD-10-CM | POA: Diagnosis not present

## 2020-06-20 DIAGNOSIS — R87810 Cervical high risk human papillomavirus (HPV) DNA test positive: Secondary | ICD-10-CM | POA: Diagnosis not present

## 2020-06-20 DIAGNOSIS — B977 Papillomavirus as the cause of diseases classified elsewhere: Secondary | ICD-10-CM | POA: Diagnosis not present

## 2020-06-20 DIAGNOSIS — R309 Painful micturition, unspecified: Secondary | ICD-10-CM | POA: Diagnosis not present

## 2020-06-20 DIAGNOSIS — Z1231 Encounter for screening mammogram for malignant neoplasm of breast: Secondary | ICD-10-CM | POA: Diagnosis not present

## 2020-06-20 DIAGNOSIS — Z01419 Encounter for gynecological examination (general) (routine) without abnormal findings: Secondary | ICD-10-CM | POA: Diagnosis not present

## 2020-06-21 DIAGNOSIS — E113559 Type 2 diabetes mellitus with stable proliferative diabetic retinopathy, unspecified eye: Secondary | ICD-10-CM | POA: Diagnosis not present

## 2020-06-21 DIAGNOSIS — Z794 Long term (current) use of insulin: Secondary | ICD-10-CM | POA: Diagnosis not present

## 2020-06-27 DIAGNOSIS — Z5181 Encounter for therapeutic drug level monitoring: Secondary | ICD-10-CM | POA: Diagnosis not present

## 2020-06-27 DIAGNOSIS — Z794 Long term (current) use of insulin: Secondary | ICD-10-CM | POA: Diagnosis not present

## 2020-06-27 DIAGNOSIS — E119 Type 2 diabetes mellitus without complications: Secondary | ICD-10-CM | POA: Diagnosis not present

## 2020-06-27 DIAGNOSIS — Z940 Kidney transplant status: Secondary | ICD-10-CM | POA: Diagnosis not present

## 2020-06-27 DIAGNOSIS — G589 Mononeuropathy, unspecified: Secondary | ICD-10-CM | POA: Diagnosis not present

## 2020-07-13 ENCOUNTER — Encounter (HOSPITAL_BASED_OUTPATIENT_CLINIC_OR_DEPARTMENT_OTHER): Payer: Self-pay

## 2020-07-13 ENCOUNTER — Other Ambulatory Visit: Payer: Self-pay

## 2020-07-13 ENCOUNTER — Emergency Department (HOSPITAL_BASED_OUTPATIENT_CLINIC_OR_DEPARTMENT_OTHER): Payer: BC Managed Care – PPO | Admitting: Radiology

## 2020-07-13 ENCOUNTER — Emergency Department (HOSPITAL_BASED_OUTPATIENT_CLINIC_OR_DEPARTMENT_OTHER)
Admission: EM | Admit: 2020-07-13 | Discharge: 2020-07-13 | Disposition: A | Discharge status: Home / Self Care | Payer: BC Managed Care – PPO | Attending: Emergency Medicine | Admitting: Emergency Medicine

## 2020-07-13 DIAGNOSIS — M25562 Pain in left knee: Secondary | ICD-10-CM | POA: Diagnosis not present

## 2020-07-13 DIAGNOSIS — S80912A Unspecified superficial injury of left knee, initial encounter: Secondary | ICD-10-CM | POA: Diagnosis not present

## 2020-07-13 DIAGNOSIS — Z79899 Other long term (current) drug therapy: Secondary | ICD-10-CM | POA: Diagnosis not present

## 2020-07-13 DIAGNOSIS — E1151 Type 2 diabetes mellitus with diabetic peripheral angiopathy without gangrene: Secondary | ICD-10-CM | POA: Insufficient documentation

## 2020-07-13 DIAGNOSIS — Z7982 Long term (current) use of aspirin: Secondary | ICD-10-CM | POA: Diagnosis not present

## 2020-07-13 DIAGNOSIS — N186 End stage renal disease: Secondary | ICD-10-CM | POA: Diagnosis not present

## 2020-07-13 DIAGNOSIS — S8992XA Unspecified injury of left lower leg, initial encounter: Secondary | ICD-10-CM | POA: Diagnosis not present

## 2020-07-13 DIAGNOSIS — S80212A Abrasion, left knee, initial encounter: Secondary | ICD-10-CM | POA: Insufficient documentation

## 2020-07-13 DIAGNOSIS — E1122 Type 2 diabetes mellitus with diabetic chronic kidney disease: Secondary | ICD-10-CM | POA: Insufficient documentation

## 2020-07-13 DIAGNOSIS — M25552 Pain in left hip: Secondary | ICD-10-CM

## 2020-07-13 DIAGNOSIS — Z940 Kidney transplant status: Secondary | ICD-10-CM | POA: Diagnosis not present

## 2020-07-13 DIAGNOSIS — W01198A Fall on same level from slipping, tripping and stumbling with subsequent striking against other object, initial encounter: Secondary | ICD-10-CM | POA: Diagnosis not present

## 2020-07-13 DIAGNOSIS — I120 Hypertensive chronic kidney disease with stage 5 chronic kidney disease or end stage renal disease: Secondary | ICD-10-CM | POA: Diagnosis not present

## 2020-07-13 DIAGNOSIS — Z992 Dependence on renal dialysis: Secondary | ICD-10-CM | POA: Insufficient documentation

## 2020-07-13 DIAGNOSIS — Z794 Long term (current) use of insulin: Secondary | ICD-10-CM | POA: Insufficient documentation

## 2020-07-13 DIAGNOSIS — M25561 Pain in right knee: Secondary | ICD-10-CM | POA: Insufficient documentation

## 2020-07-13 DIAGNOSIS — E11319 Type 2 diabetes mellitus with unspecified diabetic retinopathy without macular edema: Secondary | ICD-10-CM | POA: Insufficient documentation

## 2020-07-13 DIAGNOSIS — E1136 Type 2 diabetes mellitus with diabetic cataract: Secondary | ICD-10-CM | POA: Diagnosis not present

## 2020-07-13 DIAGNOSIS — S8990XA Unspecified injury of unspecified lower leg, initial encounter: Secondary | ICD-10-CM

## 2020-07-13 DIAGNOSIS — S80911A Unspecified superficial injury of right knee, initial encounter: Secondary | ICD-10-CM | POA: Diagnosis not present

## 2020-07-13 DIAGNOSIS — W19XXXA Unspecified fall, initial encounter: Secondary | ICD-10-CM

## 2020-07-13 MED ORDER — TRAMADOL HCL 50 MG PO TABS: 50.0000 mg | ORAL_TABLET | Freq: Four times a day (QID) | ORAL | 0 refills | Status: DC | PRN

## 2020-07-13 NOTE — ED Triage Notes (Signed)
Pt c/o a fall that happened yesterday around 1630. Pt c/o bilateral knee pain and left hip pain.

## 2020-07-13 NOTE — ED Notes (Signed)
ED Provider at bedside. 

## 2020-07-13 NOTE — Discharge Instructions (Addendum)
Trays of both knees and left hip without any acute bony abnormalities.  Make an appointment to follow-up with sports medicine Dr. Raeford Razor if not improving over the next several days.   Prescription for tramadol provided to help with the pain.
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
## 2020-07-15 DIAGNOSIS — Z20822 Contact with and (suspected) exposure to covid-19: Secondary | ICD-10-CM | POA: Diagnosis not present

## 2020-07-15 DIAGNOSIS — Z03818 Encounter for observation for suspected exposure to other biological agents ruled out: Secondary | ICD-10-CM | POA: Diagnosis not present

## 2020-07-18 ENCOUNTER — Other Ambulatory Visit: Payer: Self-pay

## 2020-07-18 ENCOUNTER — Ambulatory Visit: Payer: BC Managed Care – PPO | Admitting: Family Medicine

## 2020-07-18 ENCOUNTER — Ambulatory Visit (INDEPENDENT_AMBULATORY_CARE_PROVIDER_SITE_OTHER): Payer: BC Managed Care – PPO | Admitting: Family Medicine

## 2020-07-18 VITALS — BP 135/59 | Ht 62.0 in | Wt 191.0 lb

## 2020-07-18 DIAGNOSIS — M25561 Pain in right knee: Secondary | ICD-10-CM

## 2020-07-18 DIAGNOSIS — M25562 Pain in left knee: Secondary | ICD-10-CM | POA: Diagnosis not present

## 2020-07-18 DIAGNOSIS — M25552 Pain in left hip: Secondary | ICD-10-CM

## 2020-07-18 MED ORDER — HYDROCODONE-ACETAMINOPHEN 5-325 MG PO TABS: 1.0000 | ORAL_TABLET | Freq: Four times a day (QID) | ORAL | 0 refills | Status: DC | PRN

## 2020-07-18 NOTE — Patient Instructions (Signed)
You have severe knee and left hip contusions. Ice the areas 15 minutes at a time as needed. Topical biofreeze or aspercreme up to 4 times a day may be helpful. Lidocaine patches (these are over the counter now) can be used also. Hydrocodone as needed for severe pain - this has tylenol in it so don't take these both - most you can have is '4000mg'$  of tylenol a day. Easy motion exercises of your hip and your knees as we discussed. Follow up with me in 2 weeks for reevaluation.

## 2020-07-18 NOTE — Progress Notes (Signed)
PCP: Awanda Mink, MD  Subjective:   HPI: Patient is a 50 y.o. female here for bilateral knee pain and left hip pain.  S/p fall 5/18. 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 Fell down 2 steps off of sidewalk and landed on both knees and rolled on to left hip. Denies hitting her head or catching with wrist. She was given tramadol for pain but this made her too groggy to go to work the following day. She continues to have anterior bilateral knee and lateral left hip soreness that has somewhat improved over the last few days.   Past Medical History:  Diagnosis Date  . Anemia    low iron  . Constipation    when given iron  . Diabetes mellitus without complication ([place])    type 2  . Diabetic retinopathy ([place])   . H/O detached retina repair    bilateral  . Hypertension   . Pneumonia   . Renal disorder    dialysis on T/Th/Sa    Current Outpatient Medications on File Prior to Visit  Medication Sig Dispense Refill  . amLODipine (NORVASC) 5 MG tablet Take 5 mg by mouth at bedtime.  11  . aspirin 81 MG tablet Take 81 mg by mouth daily.    . B Complex-C-Folic Acid (RENA-VITE PO) Take 1 tablet by mouth daily.    . Continuous Blood Gluc Receiver (DEXCOM G6 RECEIVER) [place] APPLY ROUTE ONCE    . Continuous Blood Gluc Sensor (DEXCOM G6 SENSOR) MISC CHANGE SENSOR EVERY 10 DAYS 3 each 3  . Continuous Blood Gluc Transmit (DEXCOM G6 TRANSMITTER) MISC USE TO CHECK BLOOD SUGAR AS DIRECTED 1 each 0  . folic acid (FOLVITE) 1 MG tablet Take 1 mg by mouth daily.  0  . glucose blood (ONETOUCH VERIO) test strip 1 each by Other route 2 (two) times daily. And lancets 2/day 100 each 12  . imiquimod (ALDARA) 5 % cream USE THREE TIMES WEEKLY AT BEDTIME. [place] OFF WITH SOAP AND WATER 6 TO 10 HOURS LATER. MAX 16 WEEKS. AVOID SEXUAL CONTACT WHILE CREAM IN ON SK  0  . Insulin Lispro Prot & Lispro (HUMALOG MIX 75/25 KWIKPEN) (75-25) 100 UNIT/ML Kwikpen 25 units with breakfast, and 35 units  with supper 15 mL 1  . Insulin Pen Needle (PEN NEEDLES) 32G X 5 MM MISC 1 each by Does not apply route in the morning and at bedtime. 180 each 3  . Lancets (ONETOUCH DELICA PLUS 123XX123) MISC USE TWICE A DAY TO CHECK BLOOD SUGAR    . levonorgestrel (MIRENA) 20 MCG/24HR IUD 1 each by Intrauterine route once.    . lidocaine-prilocaine (EMLA) cream Apply 1 application topically daily as needed (dialysis).     . mycophenolate (CELLCEPT) 250 MG capsule Take 1,000 mg by mouth 2 (two) times daily.    . polyethylene glycol (MIRALAX / GLYCOLAX) packet Take 17 g by mouth daily.    . predniSONE (DELTASONE) 5 MG tablet Take 5 mg by mouth daily with breakfast.    . ranitidine (ZANTAC) 150 MG tablet Take 150 mg by mouth 2 (two) times daily.    . sevelamer carbonate (RENVELA) 800 MG tablet Take 4,000 mg by mouth 3 (three) times daily with meals.     . tacrolimus (PROGRAF) 1 MG capsule Take 9 mg by mouth 2 (two) times daily. 5 in the morning .    . traMADol (ULTRAM) 50 MG tablet Take 1 tablet (50 mg  total) by mouth every 6 (six) hours as needed. 15 tablet 0  . valGANciclovir (VALCYTE) 450 MG tablet Take by mouth daily. 1 tab every other day.     No current facility-administered medications on file prior to visit.    Past Surgical History:  Procedure Laterality Date  . ARTERIOVENOUS GRAFT PLACEMENT Left 07/2010  . AV FISTULA PLACEMENT Right 11/25/2015   Procedure: INSERTION OF ARTERIOVENOUS (AV) GORE-TEX GRAFT RIGHT  ARM USING 4-7 MM X 45 CM STRETCH GRAFT.;  Surgeon: Angelia Mould, MD;  Location: [facility] OR;  Service: Vascular;  Laterality: Right;  . basilic vein transposition Left 2011  . CESAREAN SECTION    . CHOLECYSTECTOMY N/A 10/08/2014   Procedure: LAPAROSCOPIC CHOLECYSTECTOMY ;  Surgeon: Erroll Luna, MD;  Location: [facility];  Service: General;  Laterality: N/A;  . EYE SURGERY     retinal detachment  . REVISION OF ARTERIOVENOUS GORETEX GRAFT Left 05/13/2015   Procedure: REVISION OF ARTERIOVENOUS  GORETEX GRAFT;  Surgeon: Angelia Mould, MD;  Location: [facility];  Service: Vascular;  Laterality: Left;    Allergies  Allergen Reactions  . Fluorescein Shortness Of Breath and Other (See Comments)    Hypertension  . Iodinated Diagnostic Agents Anaphylaxis, Hives and Other (See Comments)    Diffuse swelling  . Iohexol Anaphylaxis, Hives, Itching, Swelling and Other (See Comments)    13 HR PRE-MEDS REQUIRED  . Latex Itching    Social History   Socioeconomic History  . Marital status: Married    Spouse name: Not on file  . Number of children: Not on file  . Years of education: Not on file  . Highest education level: Not on file  Occupational History  . Not on file  Tobacco Use  . Smoking status: Never Smoker  . Smokeless tobacco: Never Used  Substance and Sexual Activity  . Alcohol use: No    Alcohol/week: 0.0 standard drinks  . Drug use: No  . Sexual activity: Not on file  Other Topics Concern  . Not on file  Social History Narrative  . Not on file   Social Determinants of Health   Financial Resource Strain: Not on file  Food Insecurity: Not on file  Transportation Needs: Not on file  Physical Activity: Not on file  Stress: Not on file  Social Connections: Not on file  Intimate Partner Violence: Not on file    Family History  Problem Relation Age of Onset  . Diabetes Maternal Grandmother     BP (!) 135/59   Ht '5\' 2"'$  (1.575 m)   Wt 191 lb (86.6 kg)   BMI 34.93 kg/m   No flowsheet data found.  No flowsheet data found.  Review of Systems: See HPI above.     Objective:  Physical Exam:  Gen: NAD, comfortable in exam room  Bilateral knee No gross deformity, ecchymoses, swelling. Left knee with superficial injury w/ scab formation Globally TTP. FROM with normal strength. Negative ant/post drawers. Negative valgus/varus testing. Negative lachman. Negative mcmurrays, apleys. NV intact distally.  Left hip No deformity. FROM with 5/5  strength. TTP over lateral-posterior hip NVI distally. Negative logroll Negative faber, fadir, and piriformis stretches.  Exam limited by patient's pain w/ movement   Assessment & Plan:  1. Bilateral knee pain Acute. S/p fall 5/18 directly on knees. Imaging from ED w/o acute fracture otherwise evidence of arthritic changes. Knee exam unremarkable. Patient continues to be very tender to palpation in this acute phase. Discussed recovery trajectory and pain  should improve over the next few days. Will consider the need for PT when motion returns and pain improves.   2. Left hip pain Acute. S/p fall 5/18 rolled onto left hip. No obvious bruising able to bear weight at this time. Continues to be very tender to palpation. Hip film from ED w/o acute fracture otherwise showing arthritic changes.   Plan: -Ice every 15 mins PRN -Topical creams if helpful -Can continue tylenol, Hydrocodone rx for breakthrough pain -Gentle motion exercises for hip and knee -F/u in 2 weeks for re-evaluation

## 2020-07-19 ENCOUNTER — Encounter: Payer: Self-pay | Admitting: Family Medicine

## 2020-07-20 ENCOUNTER — Ambulatory Visit: Payer: BC Managed Care – PPO | Admitting: Family Medicine

## 2020-07-27 ENCOUNTER — Encounter: Payer: Self-pay | Admitting: Family Medicine

## 2020-07-27 ENCOUNTER — Other Ambulatory Visit: Payer: Self-pay

## 2020-07-27 ENCOUNTER — Ambulatory Visit (INDEPENDENT_AMBULATORY_CARE_PROVIDER_SITE_OTHER): Payer: BC Managed Care – PPO | Admitting: Family Medicine

## 2020-07-27 VITALS — BP 149/68 | Ht 62.0 in | Wt 198.0 lb

## 2020-07-27 DIAGNOSIS — M25562 Pain in left knee: Secondary | ICD-10-CM

## 2020-07-27 DIAGNOSIS — Z940 Kidney transplant status: Secondary | ICD-10-CM | POA: Diagnosis not present

## 2020-07-27 DIAGNOSIS — L659 Nonscarring hair loss, unspecified: Secondary | ICD-10-CM | POA: Diagnosis not present

## 2020-07-27 DIAGNOSIS — M25552 Pain in left hip: Secondary | ICD-10-CM

## 2020-07-27 DIAGNOSIS — I10 Essential (primary) hypertension: Secondary | ICD-10-CM | POA: Diagnosis not present

## 2020-07-27 DIAGNOSIS — R809 Proteinuria, unspecified: Secondary | ICD-10-CM | POA: Diagnosis not present

## 2020-07-27 DIAGNOSIS — D638 Anemia in other chronic diseases classified elsewhere: Secondary | ICD-10-CM | POA: Diagnosis not present

## 2020-07-27 DIAGNOSIS — D849 Immunodeficiency, unspecified: Secondary | ICD-10-CM | POA: Diagnosis not present

## 2020-07-27 DIAGNOSIS — Z79899 Other long term (current) drug therapy: Secondary | ICD-10-CM | POA: Diagnosis not present

## 2020-07-27 DIAGNOSIS — M25561 Pain in right knee: Secondary | ICD-10-CM | POA: Diagnosis not present

## 2020-07-27 DIAGNOSIS — E872 Acidosis: Secondary | ICD-10-CM | POA: Diagnosis not present

## 2020-07-27 DIAGNOSIS — Z4822 Encounter for aftercare following kidney transplant: Secondary | ICD-10-CM | POA: Diagnosis not present

## 2020-07-27 DIAGNOSIS — E119 Type 2 diabetes mellitus without complications: Secondary | ICD-10-CM | POA: Diagnosis not present

## 2020-07-27 DIAGNOSIS — A630 Anogenital (venereal) warts: Secondary | ICD-10-CM | POA: Diagnosis not present

## 2020-07-27 NOTE — Progress Notes (Signed)
PCP: Awanda Mink, MD  Subjective:   HPI: Patient is a 50 y.o. female here for bilateral knee, left hip injuries.  5/23: S/p fall 5/18. 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 Fell down 2 steps off of sidewalk and landed on both knees and rolled on to left hip. Denies hitting her head or catching with wrist. She was given tramadol for pain but this made her too groggy to go to work the following day. She continues to have anterior bilateral knee and lateral left hip soreness that has somewhat improved over the last few days.   6/1: Patient reports she has improved a lot since last visit. Only occasionally taking acetaminophen at the end of the day. Knees feel stiff and tender especially in the morning. Knees and hip not waking her up at night. No new injuries. Walking around better.  Past Medical History:  Diagnosis Date  . Anemia    low iron  . Constipation    when given iron  . Diabetes mellitus without complication ([place])    type 2  . Diabetic retinopathy ([place])   . H/O detached retina repair    bilateral  . Hypertension   . Pneumonia   . Renal disorder    dialysis on T/Th/Sa    Current Outpatient Medications on File Prior to Visit  Medication Sig Dispense Refill  . amLODipine (NORVASC) 5 MG tablet Take 5 mg by mouth at bedtime.  11  . aspirin 81 MG tablet Take 81 mg by mouth daily.    . B Complex-C-Folic Acid (RENA-VITE PO) Take 1 tablet by mouth daily.    . Continuous Blood Gluc Receiver (DEXCOM G6 RECEIVER) DEVI APPLY ROUTE ONCE    . Continuous Blood Gluc Sensor (DEXCOM G6 SENSOR) MISC CHANGE SENSOR EVERY 10 DAYS 3 each 3  . Continuous Blood Gluc Transmit (DEXCOM G6 TRANSMITTER) MISC USE TO CHECK BLOOD SUGAR AS DIRECTED 1 each 0  . folic acid (FOLVITE) 1 MG tablet Take 1 mg by mouth daily.  0  . glucose blood (ONETOUCH VERIO) test strip 1 each by Other route 2 (two) times daily. And lancets 2/day 100 each 12  . HYDROcodone-acetaminophen  (NORCO) 5-325 MG tablet Take 1 tablet by mouth every 6 (six) hours as needed for moderate pain. 20 tablet 0  . imiquimod (ALDARA) 5 % cream USE THREE TIMES WEEKLY AT BEDTIME. WASH OFF WITH SOAP AND WATER 6 TO 10 HOURS LATER. MAX 16 WEEKS. AVOID SEXUAL CONTACT WHILE CREAM IN ON SK  0  . Insulin Lispro Prot & Lispro (HUMALOG MIX 75/25 KWIKPEN) (75-25) 100 UNIT/ML Kwikpen 25 units with breakfast, and 35 units with supper 15 mL 1  . Insulin Pen Needle (PEN NEEDLES) 32G X 5 MM MISC 1 each by Does not apply route in the morning and at bedtime. 180 each 3  . Lancets (ONETOUCH DELICA PLUS 123XX123) MISC USE TWICE A DAY TO CHECK BLOOD SUGAR    . levonorgestrel (MIRENA) 20 MCG/24HR IUD 1 each by Intrauterine route once.    . lidocaine-prilocaine (EMLA) cream Apply 1 application topically daily as needed (dialysis).     . mycophenolate (CELLCEPT) 250 MG capsule Take 1,000 mg by mouth 2 (two) times daily.    Marland Kitchen PARoxetine (PAXIL) 10 MG tablet Take 10 mg by mouth daily.    . polyethylene glycol (MIRALAX / GLYCOLAX) packet Take 17 g by mouth daily.    . predniSONE (DELTASONE) 5 MG tablet Take  5 mg by mouth daily with breakfast.    . ranitidine (ZANTAC) 150 MG tablet Take 150 mg by mouth 2 (two) times daily.    . sevelamer carbonate (RENVELA) 800 MG tablet Take 4,000 mg by mouth 3 (three) times daily with meals.     . sodium bicarbonate 650 MG tablet Take 1,300 mg by mouth 2 (two) times daily.    . tacrolimus (PROGRAF) 1 MG capsule Take 9 mg by mouth 2 (two) times daily. 5 in the morning .    . valGANciclovir (VALCYTE) 450 MG tablet Take by mouth daily. 1 tab every other day.     No current facility-administered medications on file prior to visit.    Past Surgical History:  Procedure Laterality Date  . ARTERIOVENOUS GRAFT PLACEMENT Left 07/2010  . AV FISTULA PLACEMENT Right 11/25/2015   Procedure: INSERTION OF ARTERIOVENOUS (AV) GORE-TEX GRAFT RIGHT  ARM USING 4-7 MM X 45 CM STRETCH GRAFT.;  Surgeon:  Angelia Mould, MD;  Location: [facility] OR;  Service: Vascular;  Laterality: Right;  . basilic vein transposition Left 2011  . CESAREAN SECTION    . CHOLECYSTECTOMY N/A 10/08/2014   Procedure: LAPAROSCOPIC CHOLECYSTECTOMY ;  Surgeon: Erroll Luna, MD;  Location: [facility];  Service: General;  Laterality: N/A;  . EYE SURGERY     retinal detachment  . REVISION OF ARTERIOVENOUS GORETEX GRAFT Left 05/13/2015   Procedure: REVISION OF ARTERIOVENOUS GORETEX GRAFT;  Surgeon: Angelia Mould, MD;  Location: [facility];  Service: Vascular;  Laterality: Left;    Allergies  Allergen Reactions  . Fluorescein Shortness Of Breath and Other (See Comments)    Hypertension  . Iodinated Diagnostic Agents Anaphylaxis, Hives and Other (See Comments)    Diffuse swelling  . Iohexol Anaphylaxis, Hives, Itching, Swelling and Other (See Comments)    13 HR PRE-MEDS REQUIRED  . Latex Itching  . Albumen, Egg     Other reaction(s): Other (See Comments) CANNOT TAKE DUE TO AUTO IMMUNE DISORDER  CANNOT TAKE DUE TO AUTO IMMUNE DISORDER  CANNOT TAKE DUE TO AUTO IMMUNE DISORDER   . Nsaids Other (See Comments)    CANNOT TAKE DUE TO KIDNEY  CANNOT TAKE DUE TO KIDNEY  CANNOT TAKE DUE TO KIDNEY   . Shellfish Allergy     Other reaction(s): Other (See Comments) CANNOT TAKE DUE TO KIDNEY TRANSPLANT  CANNOT TAKE DUE TO KIDNEY TRANSPLANT  CANNOT TAKE DUE TO KIDNEY TRANSPLANT     Social History   Socioeconomic History  . Marital status: Married    Spouse name: Not on file  . Number of children: Not on file  . Years of education: Not on file  . Highest education level: Not on file  Occupational History  . Not on file  Tobacco Use  . Smoking status: Never Smoker  . Smokeless tobacco: Never Used  Substance and Sexual Activity  . Alcohol use: No    Alcohol/week: 0.0 standard drinks  . Drug use: No  . Sexual activity: Not on file  Other Topics Concern  . Not on file  Social History Narrative  . Not on file    Social Determinants of Health   Financial Resource Strain: Not on file  Food Insecurity: Not on file  Transportation Needs: Not on file  Physical Activity: Not on file  Stress: Not on file  Social Connections: Not on file  Intimate Partner Violence: Not on file    Family History  Problem Relation Age of Onset  . Diabetes  Maternal Grandmother     BP (!) 149/68   Ht '5\' 2"'$  (1.575 m)   Wt 198 lb (89.8 kg)   BMI 36.21 kg/m   [facility] Adult Exercise 07/27/2020  Frequency of aerobic exercise (# of days/week) 3  Average time in minutes 30  Frequency of strengthening activities (# of days/week) 2    No flowsheet data found.  Review of Systems: See HPI above.     Objective:  Physical Exam:  Gen: NAD, comfortable in exam room  Right knee: No gross deformity, ecchymoses, effusion. Mild TTP medial and lateral joint lines. FROM with normal strength. Negative ant/post drawers. Negative valgus/varus testing. Negative lachman. Negative mcmurrays, apleys. NV intact distally.  Left knee: No gross deformity, ecchymoses, effusion. Mild TTP medial and lateral joint lines. FROM with normal strength. Negative ant/post drawers. Negative valgus/varus testing. Negative lachman. Negative mcmurrays, apleys. NV intact distally.  Left hip: No deformity. FROM with 5/5 strength. Tenderness to palpation proximal IT band, over greater trochanter NVI distally. Negative logroll   Assessment & Plan:  1. Bilateral knee pain - radiographs negative.  2/2 contusion.  Motion exercises, tylenol, icing if needed.  2. Left hip pain - radiographs negative.  2/2 contusion.  Motion exercises, tylenol, icing if needed.

## 2020-07-27 NOTE — Patient Instructions (Signed)
You're doing great! Do the home exercises 3-4 times a week for the next 4 weeks. Ok to work out at [employer] [place] just avoid squats, lunges for another 2-3 weeks. Icing, tylenol if needed. Follow up with me as needed.

## 2020-08-08 DIAGNOSIS — D071 Carcinoma in situ of vulva: Secondary | ICD-10-CM | POA: Diagnosis not present

## 2020-08-08 DIAGNOSIS — Z940 Kidney transplant status: Secondary | ICD-10-CM | POA: Diagnosis not present

## 2020-08-08 DIAGNOSIS — D849 Immunodeficiency, unspecified: Secondary | ICD-10-CM | POA: Diagnosis not present

## 2020-10-11 ENCOUNTER — Other Ambulatory Visit: Payer: Self-pay

## 2020-10-11 ENCOUNTER — Ambulatory Visit (INDEPENDENT_AMBULATORY_CARE_PROVIDER_SITE_OTHER): Payer: BC Managed Care – PPO | Admitting: Podiatry

## 2020-10-11 ENCOUNTER — Ambulatory Visit (INDEPENDENT_AMBULATORY_CARE_PROVIDER_SITE_OTHER): Payer: BC Managed Care – PPO

## 2020-10-11 ENCOUNTER — Encounter: Payer: Self-pay | Admitting: Podiatry

## 2020-10-11 DIAGNOSIS — S90859A Superficial foreign body, unspecified foot, initial encounter: Secondary | ICD-10-CM | POA: Diagnosis not present

## 2020-10-11 DIAGNOSIS — S90852A Superficial foreign body, left foot, initial encounter: Secondary | ICD-10-CM

## 2020-10-12 NOTE — Progress Notes (Signed)
She presents today after having not seen her for a little more than a year with a chief concern of pain to the plantar forefoot left states that she stepped on a piece of glass in the house on 10/08/2020 and she tried picking it out herself but she does not think that she got it all out.  Objective: Vital signs are stable she is alert and oriented x3.  Pulses are palpable.  He has a small puncture wound with no purulence no malodor no cellulitis beneath the third toe of the left foot.  I am prepped the area with Betadine and debrided the area and removed a small piece of glass measuring about millimeter by millimeter it appeared to be clear glass.  There is no purulence associated with it.  Assessment foreign body left.  Plan: Removal foreign body left foot.  When she stood up states that it was still kind of tender.  I encouraged her to soak Epson salts and warm water should this not alleviate her symptoms she will notify us and we will consider further evaluation.

## 2020-10-15 ENCOUNTER — Other Ambulatory Visit: Payer: Self-pay | Admitting: Endocrinology

## 2020-10-15 DIAGNOSIS — Z794 Long term (current) use of insulin: Secondary | ICD-10-CM

## 2020-10-25 ENCOUNTER — Ambulatory Visit: Payer: BC Managed Care – PPO | Admitting: Podiatry

## 2020-11-08 DIAGNOSIS — Z794 Long term (current) use of insulin: Secondary | ICD-10-CM | POA: Diagnosis not present

## 2020-11-08 DIAGNOSIS — E1165 Type 2 diabetes mellitus with hyperglycemia: Secondary | ICD-10-CM | POA: Diagnosis not present

## 2020-11-30 DIAGNOSIS — L659 Nonscarring hair loss, unspecified: Secondary | ICD-10-CM | POA: Diagnosis not present

## 2020-11-30 DIAGNOSIS — L219 Seborrheic dermatitis, unspecified: Secondary | ICD-10-CM | POA: Diagnosis not present

## 2020-11-30 DIAGNOSIS — D2260 Melanocytic nevi of unspecified upper limb, including shoulder: Secondary | ICD-10-CM | POA: Diagnosis not present

## 2020-11-30 DIAGNOSIS — L818 Other specified disorders of pigmentation: Secondary | ICD-10-CM | POA: Diagnosis not present

## 2020-12-07 DIAGNOSIS — N2581 Secondary hyperparathyroidism of renal origin: Secondary | ICD-10-CM | POA: Diagnosis not present

## 2020-12-07 DIAGNOSIS — D631 Anemia in chronic kidney disease: Secondary | ICD-10-CM | POA: Diagnosis not present

## 2020-12-07 DIAGNOSIS — N189 Chronic kidney disease, unspecified: Secondary | ICD-10-CM | POA: Diagnosis not present

## 2020-12-07 DIAGNOSIS — Z940 Kidney transplant status: Secondary | ICD-10-CM | POA: Diagnosis not present

## 2020-12-08 DIAGNOSIS — Z940 Kidney transplant status: Secondary | ICD-10-CM | POA: Diagnosis not present

## 2020-12-10 ENCOUNTER — Other Ambulatory Visit: Payer: Self-pay | Admitting: Endocrinology

## 2020-12-10 DIAGNOSIS — E1122 Type 2 diabetes mellitus with diabetic chronic kidney disease: Secondary | ICD-10-CM

## 2020-12-10 DIAGNOSIS — Z992 Dependence on renal dialysis: Secondary | ICD-10-CM

## 2020-12-10 DIAGNOSIS — Z794 Long term (current) use of insulin: Secondary | ICD-10-CM

## 2021-01-11 DIAGNOSIS — I10 Essential (primary) hypertension: Secondary | ICD-10-CM | POA: Diagnosis not present

## 2021-01-11 DIAGNOSIS — I739 Peripheral vascular disease, unspecified: Secondary | ICD-10-CM | POA: Diagnosis not present

## 2021-01-11 DIAGNOSIS — Z6835 Body mass index (BMI) 35.0-35.9, adult: Secondary | ICD-10-CM | POA: Diagnosis not present

## 2021-01-13 DIAGNOSIS — Z20822 Contact with and (suspected) exposure to covid-19: Secondary | ICD-10-CM | POA: Diagnosis not present

## 2021-01-24 DIAGNOSIS — D485 Neoplasm of uncertain behavior of skin: Secondary | ICD-10-CM | POA: Diagnosis not present

## 2021-01-31 DIAGNOSIS — D849 Immunodeficiency, unspecified: Secondary | ICD-10-CM | POA: Diagnosis not present

## 2021-01-31 DIAGNOSIS — Z940 Kidney transplant status: Secondary | ICD-10-CM | POA: Diagnosis not present

## 2021-01-31 DIAGNOSIS — Z23 Encounter for immunization: Secondary | ICD-10-CM | POA: Diagnosis not present

## 2021-02-13 DIAGNOSIS — L669 Cicatricial alopecia, unspecified: Secondary | ICD-10-CM | POA: Diagnosis not present

## 2021-02-13 DIAGNOSIS — D071 Carcinoma in situ of vulva: Secondary | ICD-10-CM | POA: Diagnosis not present

## 2021-02-20 ENCOUNTER — Other Ambulatory Visit: Payer: Self-pay | Admitting: Endocrinology

## 2021-02-20 DIAGNOSIS — Z794 Long term (current) use of insulin: Secondary | ICD-10-CM

## 2021-05-02 ENCOUNTER — Other Ambulatory Visit: Payer: Self-pay | Admitting: Endocrinology

## 2021-05-02 DIAGNOSIS — Z992 Dependence on renal dialysis: Secondary | ICD-10-CM

## 2021-05-02 DIAGNOSIS — E1122 Type 2 diabetes mellitus with diabetic chronic kidney disease: Secondary | ICD-10-CM

## 2021-05-12 ENCOUNTER — Observation Stay (HOSPITAL_COMMUNITY): Payer: BC Managed Care – PPO

## 2021-05-12 ENCOUNTER — Inpatient Hospital Stay (HOSPITAL_BASED_OUTPATIENT_CLINIC_OR_DEPARTMENT_OTHER)
Admission: EM | Admit: 2021-05-12 | Discharge: 2021-05-13 | DRG: 065 | Disposition: A | Discharge status: Home / Self Care | Payer: BC Managed Care – PPO | Attending: Internal Medicine | Admitting: Internal Medicine

## 2021-05-12 ENCOUNTER — Other Ambulatory Visit: Payer: Self-pay

## 2021-05-12 ENCOUNTER — Emergency Department (HOSPITAL_BASED_OUTPATIENT_CLINIC_OR_DEPARTMENT_OTHER): Payer: BC Managed Care – PPO

## 2021-05-12 DIAGNOSIS — I639 Cerebral infarction, unspecified: Secondary | ICD-10-CM

## 2021-05-12 DIAGNOSIS — Z79899 Other long term (current) drug therapy: Secondary | ICD-10-CM | POA: Diagnosis not present

## 2021-05-12 DIAGNOSIS — I10 Essential (primary) hypertension: Secondary | ICD-10-CM | POA: Diagnosis present

## 2021-05-12 DIAGNOSIS — Z7960 Long term (current) use of unspecified immunomodulators and immunosuppressants: Secondary | ICD-10-CM

## 2021-05-12 DIAGNOSIS — I454 Nonspecific intraventricular block: Secondary | ICD-10-CM | POA: Diagnosis present

## 2021-05-12 DIAGNOSIS — Z886 Allergy status to analgesic agent status: Secondary | ICD-10-CM | POA: Diagnosis not present

## 2021-05-12 DIAGNOSIS — Z91013 Allergy to seafood: Secondary | ICD-10-CM

## 2021-05-12 DIAGNOSIS — R202 Paresthesia of skin: Secondary | ICD-10-CM

## 2021-05-12 DIAGNOSIS — F339 Major depressive disorder, recurrent, unspecified: Secondary | ICD-10-CM | POA: Diagnosis present

## 2021-05-12 DIAGNOSIS — Z940 Kidney transplant status: Secondary | ICD-10-CM | POA: Diagnosis not present

## 2021-05-12 DIAGNOSIS — Z91040 Latex allergy status: Secondary | ICD-10-CM

## 2021-05-12 DIAGNOSIS — Z91012 Allergy to eggs: Secondary | ICD-10-CM | POA: Diagnosis not present

## 2021-05-12 DIAGNOSIS — E119 Type 2 diabetes mellitus without complications: Secondary | ICD-10-CM

## 2021-05-12 DIAGNOSIS — D84821 Immunodeficiency due to drugs: Secondary | ICD-10-CM | POA: Diagnosis present

## 2021-05-12 DIAGNOSIS — N1831 Chronic kidney disease, stage 3a: Secondary | ICD-10-CM | POA: Diagnosis present

## 2021-05-12 DIAGNOSIS — E1122 Type 2 diabetes mellitus with diabetic chronic kidney disease: Secondary | ICD-10-CM | POA: Diagnosis present

## 2021-05-12 DIAGNOSIS — E669 Obesity, unspecified: Secondary | ICD-10-CM | POA: Diagnosis present

## 2021-05-12 DIAGNOSIS — Z91041 Radiographic dye allergy status: Secondary | ICD-10-CM | POA: Diagnosis not present

## 2021-05-12 DIAGNOSIS — I129 Hypertensive chronic kidney disease with stage 1 through stage 4 chronic kidney disease, or unspecified chronic kidney disease: Secondary | ICD-10-CM | POA: Diagnosis present

## 2021-05-12 DIAGNOSIS — Z6835 Body mass index (BMI) 35.0-35.9, adult: Secondary | ICD-10-CM

## 2021-05-12 DIAGNOSIS — Z7982 Long term (current) use of aspirin: Secondary | ICD-10-CM | POA: Diagnosis not present

## 2021-05-12 DIAGNOSIS — Z794 Long term (current) use of insulin: Secondary | ICD-10-CM | POA: Diagnosis not present

## 2021-05-12 DIAGNOSIS — N183 Chronic kidney disease, stage 3 unspecified: Secondary | ICD-10-CM | POA: Diagnosis present

## 2021-05-12 DIAGNOSIS — G459 Transient cerebral ischemic attack, unspecified: Secondary | ICD-10-CM | POA: Diagnosis present

## 2021-05-12 DIAGNOSIS — R29701 NIHSS score 1: Secondary | ICD-10-CM | POA: Diagnosis present

## 2021-05-12 DIAGNOSIS — R200 Anesthesia of skin: Secondary | ICD-10-CM | POA: Diagnosis present

## 2021-05-12 DIAGNOSIS — E785 Hyperlipidemia, unspecified: Secondary | ICD-10-CM | POA: Diagnosis present

## 2021-05-12 DIAGNOSIS — I6389 Other cerebral infarction: Secondary | ICD-10-CM | POA: Diagnosis not present

## 2021-05-12 DIAGNOSIS — Z7952 Long term (current) use of systemic steroids: Secondary | ICD-10-CM | POA: Diagnosis not present

## 2021-05-12 DIAGNOSIS — N186 End stage renal disease: Secondary | ICD-10-CM

## 2021-05-12 DIAGNOSIS — Z992 Dependence on renal dialysis: Secondary | ICD-10-CM | POA: Diagnosis not present

## 2021-05-12 DIAGNOSIS — Z8673 Personal history of transient ischemic attack (TIA), and cerebral infarction without residual deficits: Secondary | ICD-10-CM | POA: Diagnosis not present

## 2021-05-12 DIAGNOSIS — N189 Chronic kidney disease, unspecified: Secondary | ICD-10-CM | POA: Diagnosis present

## 2021-05-12 DIAGNOSIS — I6381 Other cerebral infarction due to occlusion or stenosis of small artery: Principal | ICD-10-CM | POA: Diagnosis present

## 2021-05-12 DIAGNOSIS — I63332 Cerebral infarction due to thrombosis of left posterior cerebral artery: Secondary | ICD-10-CM | POA: Diagnosis not present

## 2021-05-12 DIAGNOSIS — N184 Chronic kidney disease, stage 4 (severe): Secondary | ICD-10-CM | POA: Diagnosis present

## 2021-05-12 HISTORY — DX: Cerebral infarction, unspecified: I63.9

## 2021-05-12 LAB — COMPREHENSIVE METABOLIC PANEL
ALT: 15 U/L (ref 0–44)
AST: 12 U/L — ABNORMAL LOW (ref 15–41)
Albumin: 4 g/dL (ref 3.5–5.0)
Alkaline Phosphatase: 64 U/L (ref 38–126)
Anion gap: 10 (ref 5–15)
BUN: 39 mg/dL — ABNORMAL HIGH (ref 6–20)
CO2: 20 mmol/L — ABNORMAL LOW (ref 22–32)
Calcium: 10.5 mg/dL — ABNORMAL HIGH (ref 8.9–10.3)
Chloride: 109 mmol/L (ref 98–111)
Creatinine, Ser: 1.39 mg/dL — ABNORMAL HIGH (ref 0.44–1.00)
GFR, Estimated: 46 mL/min — ABNORMAL LOW (ref >60)
Glucose, Bld: 192 mg/dL — ABNORMAL HIGH (ref 70–99)
Potassium: 4.8 mmol/L (ref 3.5–5.1)
Sodium: 139 mmol/L (ref 135–145)
Total Bilirubin: 0.6 mg/dL (ref 0.3–1.2)
Total Protein: 7.2 g/dL (ref 6.5–8.1)

## 2021-05-12 LAB — DIFFERENTIAL
Abs Immature Granulocytes: 0.03 10*3/uL (ref 0.00–0.07)
Basophils Absolute: 0 10*3/uL (ref 0.0–0.1)
Basophils Relative: 0 %
Eosinophils Absolute: 0.1 10*3/uL (ref 0.0–0.5)
Eosinophils Relative: 1 %
Immature Granulocytes: 0 %
Lymphocytes Relative: 31 %
Lymphs Abs: 2.3 10*3/uL (ref 0.7–4.0)
Monocytes Absolute: 0.7 10*3/uL (ref 0.1–1.0)
Monocytes Relative: 9 %
Neutro Abs: 4.4 10*3/uL (ref 1.7–7.7)
Neutrophils Relative %: 59 %

## 2021-05-12 LAB — CBC
HCT: 44.4 % (ref 36.0–46.0)
Hemoglobin: 14 g/dL (ref 12.0–15.0)
MCH: 27.2 pg (ref 26.0–34.0)
MCHC: 31.5 g/dL (ref 30.0–36.0)
MCV: 86.2 fL (ref 80.0–100.0)
Platelets: 333 10*3/uL (ref 150–400)
RBC: 5.15 MIL/uL — ABNORMAL HIGH (ref 3.87–5.11)
RDW: 13.6 % (ref 11.5–15.5)
WBC: 7.6 10*3/uL (ref 4.0–10.5)
nRBC: 0 % (ref 0.0–0.2)

## 2021-05-12 LAB — GLUCOSE, CAPILLARY: Glucose-Capillary: 192 mg/dL — ABNORMAL HIGH (ref 70–99)

## 2021-05-12 LAB — ETHANOL: Alcohol, Ethyl (B): <10 mg/dL (ref <10)

## 2021-05-12 MED ORDER — ENOXAPARIN SODIUM 60 MG/0.6ML IJ SOSY
0.5000 mg/kg | PREFILLED_SYRINGE | INTRAMUSCULAR | Status: DC
  Filled 2021-05-12: qty 0.6

## 2021-05-12 MED ORDER — INSULIN ASPART 100 UNIT/ML IJ SOLN: 0.0000 [IU] | Freq: Three times a day (TID) | INTRAMUSCULAR | Status: DC

## 2021-05-12 MED ORDER — SODIUM BICARBONATE 650 MG PO TABS
1300.0000 mg | ORAL_TABLET | Freq: Two times a day (BID) | ORAL | Status: DC
  Administered 2021-05-12 – 2021-05-13 (×2): 1300 mg via ORAL
  Filled 2021-05-12 (×2): qty 2

## 2021-05-12 MED ORDER — FAMOTIDINE 20 MG PO TABS
20.0000 mg | ORAL_TABLET | Freq: Every day | ORAL | Status: DC
  Administered 2021-05-12: 20 mg via ORAL
  Filled 2021-05-12: qty 1

## 2021-05-12 MED ORDER — CLOPIDOGREL BISULFATE 75 MG PO TABS
75.0000 mg | ORAL_TABLET | Freq: Once | ORAL | Status: AC
  Administered 2021-05-12: 75 mg via ORAL
  Filled 2021-05-12: qty 1

## 2021-05-12 MED ORDER — INSULIN GLARGINE-YFGN 100 UNIT/ML ~~LOC~~ SOLN
20.0000 [IU] | Freq: Two times a day (BID) | SUBCUTANEOUS | Status: DC
  Administered 2021-05-12: 20 [IU] via SUBCUTANEOUS
  Filled 2021-05-12 (×3): qty 0.2

## 2021-05-12 MED ORDER — FOLIC ACID 1 MG PO TABS
1.0000 mg | ORAL_TABLET | Freq: Every evening | ORAL | Status: DC
  Administered 2021-05-12: 1 mg via ORAL
  Filled 2021-05-12: qty 1

## 2021-05-12 MED ORDER — ASPIRIN 81 MG PO CHEW: 81.0000 mg | CHEWABLE_TABLET | Freq: Every day | ORAL | Status: DC

## 2021-05-12 MED ORDER — VALGANCICLOVIR HCL 450 MG PO TABS
450.0000 mg | ORAL_TABLET | Freq: Every day | ORAL | Status: DC
  Filled 2021-05-12: qty 1

## 2021-05-12 MED ORDER — DOCUSATE SODIUM 100 MG PO CAPS
100.0000 mg | ORAL_CAPSULE | Freq: Two times a day (BID) | ORAL | Status: DC
  Administered 2021-05-12: 100 mg via ORAL
  Filled 2021-05-12: qty 1

## 2021-05-12 MED ORDER — STROKE: EARLY STAGES OF RECOVERY BOOK
Freq: Once | Status: AC
  Filled 2021-05-12: qty 1

## 2021-05-12 MED ORDER — ACETAMINOPHEN 160 MG/5ML PO SOLN: 650.0000 mg | ORAL | Status: DC | PRN

## 2021-05-12 MED ORDER — PREDNISONE 5 MG PO TABS
5.0000 mg | ORAL_TABLET | Freq: Every day | ORAL | Status: DC
  Filled 2021-05-12: qty 1

## 2021-05-12 MED ORDER — CLOPIDOGREL BISULFATE 75 MG PO TABS
75.0000 mg | ORAL_TABLET | Freq: Every day | ORAL | Status: DC
  Administered 2021-05-13: 75 mg via ORAL
  Filled 2021-05-12: qty 1

## 2021-05-12 MED ORDER — HYDROCODONE-ACETAMINOPHEN 5-325 MG PO TABS: 1.0000 | ORAL_TABLET | Freq: Four times a day (QID) | ORAL | Status: DC | PRN

## 2021-05-12 MED ORDER — SODIUM CHLORIDE 0.9 % IV SOLN: INTRAVENOUS | Status: DC

## 2021-05-12 MED ORDER — ACETAMINOPHEN 650 MG RE SUPP: 650.0000 mg | RECTAL | Status: DC | PRN

## 2021-05-12 MED ORDER — PAROXETINE HCL 10 MG PO TABS
10.0000 mg | ORAL_TABLET | Freq: Every day | ORAL | Status: DC
  Administered 2021-05-13: 10 mg via ORAL
  Filled 2021-05-12: qty 1

## 2021-05-12 MED ORDER — MYCOPHENOLATE MOFETIL 250 MG PO CAPS
1000.0000 mg | ORAL_CAPSULE | Freq: Two times a day (BID) | ORAL | Status: DC
  Administered 2021-05-12: 1000 mg via ORAL
  Filled 2021-05-12 (×3): qty 4

## 2021-05-12 MED ORDER — ACETAMINOPHEN 325 MG PO TABS: 650.0000 mg | ORAL_TABLET | ORAL | Status: DC | PRN

## 2021-05-12 MED ORDER — TACROLIMUS 1 MG PO CAPS
4.0000 mg | ORAL_CAPSULE | Freq: Two times a day (BID) | ORAL | Status: DC
  Administered 2021-05-12: 4 mg via ORAL
  Filled 2021-05-12 (×3): qty 4

## 2021-05-12 NOTE — ED Notes (Signed)
Patient transported to CT 
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
## 2021-05-12 NOTE — Assessment & Plan Note (Addendum)
-  Continue home Paxil ?

## 2021-05-12 NOTE — Assessment & Plan Note (Addendum)
Patient is followed by endocrinology in the outpatient setting.  Insulin regimen includes Tresiba 32 units twice daily.   ?

## 2021-05-12 NOTE — ED Notes (Signed)
2x IV start unsuccessful by this RN. 20g, 22g attempted in bilateral hand/wrist.  ?

## 2021-05-12 NOTE — Progress Notes (Signed)
Pt refusing PIV placement except in the event of an emergency or administration of necessary IV medications. Current orders for NS@75 . Pt has bilateral AVF and is a difficult tick. Pt is tolerating PO intake well and does not want IV fluids. Dr. Marlowe Sax notified. IVF order discontinued.  ?

## 2021-05-12 NOTE — Assessment & Plan Note (Deleted)
Chronic.  Creatinine 1.39 with BUN 39.  Baseline creatinine previously have been around 1.6 last on 03/28/2021 per records from [facility]. ?-Continue to monitor kidney function ?

## 2021-05-12 NOTE — H&P (Signed)
?History and Physical  ? ? ?Patient: Morgan [name] IOE:[id number] DOB: 1970-03-01 ?DOA: 05/12/2021 ?DOS: the patient was seen and examined on 05/12/2021 ?PCP: Awanda Mink, MD  ?Patient coming from: [facility] transfer ? ?Chief Complaint:  ?Chief Complaint  ?Patient presents with  ? Numbness  ?  Right side   ? ?HPI: Morgan [name] is a 51 y.o. 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  Patient reports falling asleep last night around 11 PM.  She had been feeling fatigued with low energy, but attributed symptoms to recently stressing over continues for an upcoming event.  She had started taking vitamin B supplements to see if it would help increase her energy.  This morning when she first woke up she thought the numbness was secondary to her sleeping wrong.  The symptoms were different it involved her entire right side from her face and tongue all the way down to her feet.  Denies having any significant headache, palpitations, chest pain, change in vision, or focal weakness.  She is on a daily aspirin to keep hemodialysis access open. ? ?Upon admission into the emergency department patient was noted to be afebrile with vital signs relatively stable. 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 Labs significant for BUN 39, creatinine 1.39, glucose 192, and calcium 10.5.  She is not a candidate for tPA due to symptoms resolving and patient being out of the time window.  Neurology have been notified and recommended admission for completion of stroke work-up and to start the patient on Plavix.  TR[facility] called  to admit. ? ?Review of Systems: As mentioned in the history of present illness. All other systems reviewed and are negative. ?Past Medical History:  ?Diagnosis Date  ? Anemia   ? low iron  ? Constipation   ? when given iron  ? Diabetes mellitus without complication ([place])   ? type 2  ? Diabetic retinopathy ([place])   ? H/O detached retina repair   ? bilateral  ? Hypertension   ? Pneumonia   ? Renal disorder   ? dialysis on T/Th/Sa  ? ?Past Surgical History:  ?Procedure Laterality Date  ? ARTERIOVENOUS GRAFT PLACEMENT Left 07/2010  ? AV FISTULA PLACEMENT Right 11/25/2015  ? Procedure: INSERTION OF ARTERIOVENOUS (AV) GORE-TEX GRAFT RIGHT  ARM USING 4-7 MM X 45 CM STRETCH GRAFT.;  Surgeon: Angelia Mould, MD;  Location: [facility] OR;  Service: Vascular;  Laterality: Right;  ? basilic vein transposition Left 2011  ? CESAREAN SECTION    ? CHOLECYSTECTOMY N/A 10/08/2014  ? Procedure: LAPAROSCOPIC CHOLECYSTECTOMY ;  Surgeon: Erroll Luna, MD;  Location: [place];  Service: General;  Laterality: N/A;  ? EYE SURGERY    ? retinal detachment  ? REVISION OF ARTERIOVENOUS GORETEX GRAFT Left 05/13/2015  ? Procedure: REVISION OF ARTERIOVENOUS GORETEX GRAFT;  Surgeon: Angelia Mould, MD;  Location: [place];  Service: Vascular;  Laterality: Left;  ? ?Social History:  reports that she has never smoked. She has never used smokeless tobacco. She reports that she does not drink alcohol and does not use drugs. ? ?Allergies  ?Allergen Reactions  ? Fluorescein Shortness Of Breath and Other (See Comments)  ?  Hypertension  ? Iodinated Contrast Media Anaphylaxis, Hives and Other (See Comments)  ?  Diffuse swelling  ? Iohexol Anaphylaxis, Hives, Itching, Swelling and Other (See Comments)  ?  13 HR PRE-MEDS REQUIRED  ? Latex Itching  ? Egg White (Egg Protein)   ?  Other reaction(s): Other (See Comments) ?CANNOT TAKE DUE TO AUTO IMMUNE DISORDER  ?CANNOT TAKE DUE TO AUTO IMMUNE DISORDER  ?CANNOT TAKE DUE TO AUTO IMMUNE DISORDER   ? Nsaids Other (See  Comments)  ?  CANNOT TAKE DUE TO KIDNEY  ?CANNOT TAKE DUE TO KIDNEY  ?CANNOT TAKE DUE TO KIDNEY   ? Shellfish Allergy   ?  Other reaction(s): Other (See Comments) ?CANNOT TAKE DUE TO KIDNEY TRANSPLANT  ?CANNOT TAKE DUE TO KIDNEY TRANSPLANT  ?CANNOT TAKE DUE TO KIDNEY TRANSPLANT   ? ? ?Family History  ?Problem Relation Age of Onset  ? Diabetes Maternal Grandmother   ? ? ?Prior to Admission medications   ?Medication Sig Start Date End Date Taking? Authorizing Provider  ?amLODipine (NORVASC) 5 MG tablet Take 5 mg by mouth at bedtime. 01/03/18   [provider]  ?aspirin 81 MG tablet Take 81 mg by mouth daily.    [provider]  ?B Complex-C-Folic Acid (RENA-VITE PO) Take 1 tablet by mouth daily.    [provider]  ?Continuous Blood Gluc Receiver (DEXCOM G6 RECEIVER) [place] 03/05/19   [provider]  ?Continuous Blood Gluc Sensor (DEXCOM G6 SENSOR) MISC CHANGE SENSOR EVERY 10 DAYS 05/21/20   Renato Shin, MD  ?Continuous Blood Gluc Transmit (DEXCOM G6 TRANSMITTER) MISC USE TO CHECK BLOOD SUGAR AS DIRECTED 03/17/20   Renato Shin, MD  ?folic acid (FOLVITE) 1 MG tablet Take 1 mg by mouth daily. 01/03/18   [provider]  ?glucose blood (ONETOUCH VERIO) test strip 1 each by Other route 2 (two) times daily. And lancets 2/day 03/04/19   Renato Shin, MD  ?HYDROcodone-acetaminophen [facility]) 5-325 MG tablet Take 1 tablet by mouth every 6 (six) hours as needed for moderate pain. 07/18/20   Hudnall, Sharyn Lull, MD  ?imiquimod (ALDARA) 5 % cream USE THREE TIMES WEEKLY AT BEDTIME. WASH OFF WITH SOAP AND WATER 6 TO 10 HOURS LATER. MAX 16 WEEKS. AVOID SEXUAL CONTACT WHILE CREAM IN ON SK 11/18/17   [provider]  ?Insulin Lispro Prot & Lispro (HUMALOG MIX 75/25 KWIKPEN) (75-25) 100 UNIT/ML Kwikpen 25 units with breakfast, and 35 units with supper 05/10/20   Renato Shin, MD  ?Lancets (ONETOUCH DELICA PLUS [id number]) [place] USE TWICE A DAY TO [place] BLOOD SUGAR 03/04/19    [provider]  ?levonorgestrel (MIRENA) 20 MCG/24HR IUD 1 each by Intrauterine route once.    [provider]  ?lidocaine-prilocaine (EMLA) cream Apply 1 application topically daily as needed (dialysis).     [provider]  ?MM PEN NEEDLES 31G X 5 MM MISC USE  IN THE MORNING AND AT BEDTIME 02/21/21   Renato Shin, MD  ?mycophenolate (CELLCEPT) 250 MG capsule Take 1,000 mg by mouth 2 (two) times daily.    [provider]  ?PARoxetine (PAXIL) 10 MG tablet Take 10 mg by mouth daily. 06/20/20   [provider]  ?polyethylene glycol (MIRALAX / GLYCOLAX) packet Take 17 g by mouth daily.    [provider]  ?predniSONE (DELTASONE) 5 MG tablet Take 5 mg by mouth daily with breakfast.    [provider]  ?ranitidine (ZANTAC) 150 MG tablet Take 150 mg by mouth  2 (two) times daily.    [provider]  ?sevelamer carbonate (RENVELA) 800 MG tablet Take 4,000 mg by mouth 3 (three) times daily with meals.     [provider]  ?sodium bicarbonate 650 MG tablet Take 1,300 mg by mouth 2 (two) times daily. 07/04/20   [provider]  ?tacrolimus (PROGRAF) 1 MG capsule Take 9 mg by mouth 2 (two) times daily. 5 in the morning .    [provider]  ?valGANciclovir (VALCYTE) 450 MG tablet Take 450 mg by mouth daily.    [provider]  ? ? ?Physical Exam: ?Vitals:  ? 05/12/21 0945 05/12/21 1015 05/12/21 1145 05/12/21 1230  ?BP: 132/86 134/78 (!) 149/66 (!) 154/74  ?Pulse: 94 91 92 90  ?Resp:  17  20  ?Temp:      ?TempSrc:      ?SpO2: 100% 100% 99% 100%  ?Weight:      ?Height:      ? ?Exam ? ?Constitutional: Elderly female currently in no acute distress ?Eyes: lids and conjunctivae normal ?ENMT: Mucous membranes are moist.  ?Neck: normal, supple, no masses, no thyromegaly ?Respiratory: clear to auscultation bilaterally, no wheezing, no crackles. Normal respiratory effort. No accessory muscle use.  ?Cardiovascular: Regular rate and  rhythm, no murmurs / rubs / gallops.  Patient is a bilateral upper extremities fistulous with palpable thrill. ?Abdomen: no tenderness, no masses palpated. Bowel sounds positive.  ?Musculoskeletal: no clu

## 2021-05-12 NOTE — ED Triage Notes (Signed)
Pt to ED via POV c/o right sided numbness that started when pt woke up this morning at 6 am. Pt also reports numbness and tingling sensation in mouth, pt speech normal, reports was able to swallow medication this morning with no difficulty. HX DM, Renal transplant. Denies pain. Ambulatory to room without difficulty. A&ox4 ?

## 2021-05-12 NOTE — Consult Note (Signed)
NEUROLOGY CONSULTATION NOTE  ? ?Date of service: May 12, 2021 ?Patient Name: Morgan [name] [id number] ?MRN:  [id number] ?DOB:  February 16, 1971 ?Reason for consult: "R sided numbness" ?Requesting Provider: Norval Morton, MD ? ?History of Present Illness  ?Morgan [name] is a 51 year old female with a past medical history of hypertension and type 2 diabetes, as well as ESRD s/p transplant (on Prograf, CellCept, and prednisone).  The patient presented to the emergency department with numbness and tingling of the right side of her body that started at 6 AM on 3/17.  CT head was notable for a remote lacunar infarct in the posterior internal capsule. ? ?On interview and assessment this afternoon, the patient reports that the numbness has decreased in intensity, but that she has residual numbness on the right side of her face, tongue, and right hand.  She reports a history of diabetic retinopathy, which she says is worse in her right eye.  She denies any vision changes, seizures, hearing loss.  She does report an episode of vertigo 1 month ago. ? ?ROS  ? ?Constitutional Denies weight loss, fever and chills.   ?HEENT Denies changes in vision and hearing.   ?Respiratory Denies SOB and cough.   ?CV Denies palpitations and CP   ?GI Denies abdominal pain, nausea, vomiting and diarrhea.   ?GU Denies dysuria and urinary frequency.   ?MSK   ?Skin   ?Neurological Denies headache and syncope.   ?Psychiatric Denies recent changes in mood. Denies anxiety and depression.   ? ?Past History  ? ?Past Medical History:  ?Diagnosis Date  ?? Anemia   ? low iron  ?? Constipation   ? when given iron  ?? Diabetes mellitus without complication ([place])   ? type 2  ?? Diabetic retinopathy ([place])   ?? H/O detached retina repair   ? bilateral  ?? Hypertension   ?? Pneumonia   ?? Renal disorder   ? dialysis on T/Th/Sa  ? ?Past Surgical History:  ?Procedure Laterality Date  ?? ARTERIOVENOUS GRAFT PLACEMENT Left 07/2010  ?? AV FISTULA PLACEMENT Right  11/25/2015  ? Procedure: INSERTION OF ARTERIOVENOUS (AV) GORE-TEX GRAFT RIGHT  ARM USING 4-7 MM X 45 CM STRETCH GRAFT.;  Surgeon: Angelia Mould, MD;  Location: [place];  Service: Vascular;  Laterality: Right;  ?? basilic vein transposition Left 2011  ?? CESAREAN SECTION    ?? CHOLECYSTECTOMY N/A 10/08/2014  ? Procedure: LAPAROSCOPIC CHOLECYSTECTOMY ;  Surgeon: Erroll Luna, MD;  Location: [place];  Service: General;  Laterality: N/A;  ?? EYE SURGERY    ? retinal detachment  ?? REVISION OF ARTERIOVENOUS GORETEX GRAFT Left 05/13/2015  ? Procedure: REVISION OF ARTERIOVENOUS GORETEX GRAFT;  Surgeon: Angelia Mould, MD;  Location: [place];  Service: Vascular;  Laterality: Left;  ? ?Family History  ?Problem Relation Age of Onset  ?? Diabetes Maternal Grandmother   ? ?Social History  ? ?Socioeconomic History  ?? Marital status: Married  ?  Spouse name: Not on file  ?? Number of children: Not on file  ?? Years of education: Not on file  ?? Highest education level: Not on file  ?Occupational History  ?? Not on file  ?Tobacco Use  ?? Smoking status: Never  ?? Smokeless tobacco: Never  ?Substance and Sexual Activity  ?? Alcohol use: No  ?  Alcohol/week: 0.0 standard drinks  ?? Drug use: No  ?? Sexual activity: Not on file  ?Other Topics Concern  ?? Not on file  ?Social History Narrative  ??  Not on file  ? ?Social Determinants of Health  ? ?Financial Resource Strain: Not on file  ?Food Insecurity: Not on file  ?Transportation Needs: Not on file  ?Physical Activity: Not on file  ?Stress: Not on file  ?Social Connections: Not on file  ? ?Allergies  ?Allergen Reactions  ?? Fluorescein Shortness Of Breath and Other (See Comments)  ?  Hypertension  ?? Iodinated Contrast Media Anaphylaxis, Hives and Other (See Comments)  ?  Diffuse swelling  ?? Iohexol Anaphylaxis, Hives, Itching, Swelling and Other (See Comments)  ?  13 HR PRE-MEDS REQUIRED  ?? Latex Itching  ?? Egg White (Egg Protein)   ?  Other reaction(s): Other (See  Comments) ?CANNOT TAKE DUE TO AUTO IMMUNE DISORDER  ?CANNOT TAKE DUE TO AUTO IMMUNE DISORDER  ?CANNOT TAKE DUE TO AUTO IMMUNE DISORDER   ?? Nsaids Other (See Comments)  ?  CANNOT TAKE DUE TO KIDNEY  ?CANNOT TAKE DUE TO KIDNEY  ?CANNOT TAKE DUE TO KIDNEY   ?? Shellfish Allergy   ?  Other reaction(s): Other (See Comments) ?CANNOT TAKE DUE TO KIDNEY TRANSPLANT  ?CANNOT TAKE DUE TO KIDNEY TRANSPLANT  ?CANNOT TAKE DUE TO KIDNEY TRANSPLANT   ? ? ?Medications  ? ?Medications Prior to Admission  ?Medication Sig Dispense Refill Last Dose  ?? amLODipine (NORVASC) 5 MG tablet Take 5 mg by mouth at bedtime.  11   ?? aspirin 81 MG tablet Take 81 mg by mouth daily.     ?? B Complex-C-Folic Acid (RENA-VITE PO) Take 1 tablet by mouth daily.     ?? Continuous Blood Gluc Receiver (DEXCOM G6 RECEIVER) DEVI APPLY ROUTE ONCE     ?? Continuous Blood Gluc Sensor (DEXCOM G6 SENSOR) MISC CHANGE SENSOR EVERY 10 DAYS 3 each 3   ?? Continuous Blood Gluc Transmit (DEXCOM G6 TRANSMITTER) MISC USE TO CHECK BLOOD SUGAR AS DIRECTED 1 each 0   ?? folic acid (FOLVITE) 1 MG tablet Take 1 mg by mouth daily.  0   ?? glucose blood (ONETOUCH VERIO) test strip 1 each by Other route 2 (two) times daily. And lancets 2/day 100 each 12   ?? HYDROcodone-acetaminophen (NORCO) 5-325 MG tablet Take 1 tablet by mouth every 6 (six) hours as needed for moderate pain. 20 tablet 0   ?? imiquimod (ALDARA) 5 % cream USE THREE TIMES WEEKLY AT BEDTIME. WASH OFF WITH SOAP AND WATER 6 TO 10 HOURS LATER. MAX 16 WEEKS. AVOID SEXUAL CONTACT WHILE CREAM IN ON SK  0   ?? Insulin Lispro Prot & Lispro (HUMALOG MIX 75/25 KWIKPEN) (75-25) 100 UNIT/ML Kwikpen 25 units with breakfast, and 35 units with supper 15 mL 1   ?? Lancets (ONETOUCH DELICA PLUS [id number]) MISC USE TWICE A DAY TO CHECK BLOOD SUGAR     ?? levonorgestrel (MIRENA) 20 MCG/24HR IUD 1 each by Intrauterine route once.     ?? lidocaine-prilocaine (EMLA) cream Apply 1 application topically daily as needed (dialysis).       ?? MM PEN NEEDLES 31G X 5 MM MISC USE  IN THE MORNING AND AT BEDTIME 100 each 0   ?? mycophenolate (CELLCEPT) 250 MG capsule Take 1,000 mg by mouth 2 (two) times daily.     ?? PARoxetine (PAXIL) 10 MG tablet Take 10 mg by mouth daily.     ?? polyethylene glycol (MIRALAX / GLYCOLAX) packet Take 17 g by mouth daily.     ?? predniSONE (DELTASONE) 5 MG tablet Take 5 mg by mouth daily with breakfast.     ??  ranitidine (ZANTAC) 150 MG tablet Take 150 mg by mouth 2 (two) times daily.     ?? sevelamer carbonate (RENVELA) 800 MG tablet Take 4,000 mg by mouth 3 (three) times daily with meals.      ?? sodium bicarbonate 650 MG tablet Take 1,300 mg by mouth 2 (two) times daily.     ?? tacrolimus (PROGRAF) 1 MG capsule Take 9 mg by mouth 2 (two) times daily. 5 in the morning .     ?? valGANciclovir (VALCYTE) 450 MG tablet Take 450 mg by mouth daily.     ?  ? ?Vitals  ? ?Vitals:  ? 05/12/21 0945 05/12/21 1015 05/12/21 1145 05/12/21 1230  ?BP: 132/86 134/78 (!) 149/66 (!) 154/74  ?Pulse: 94 91 92 90  ?Resp:  17  20  ?Temp:      ?TempSrc:      ?SpO2: 100% 100% 99% 100%  ?Weight:      ?Height:      ?  ? ?Body mass index is 35.48 kg/m?. ? ?Physical Exam  ?General: Lying comfortably in bed; in no acute distress.  ?HENT: Normal oropharynx and mucosa. Normal external appearance of ears and nose.  ?Neck: Supple, no pain or tenderness  ?CV: No peripheral edema.  ?Pulmonary: Symmetric Chest rise. Normal respiratory effort.  ?Ext: No cyanosis, edema, or deformity  ?Skin: No rash. Normal palpation of skin.   ?  ?Neurologic Examination  ?Mental status/Cognition: Alert, oriented to self, place, month and year, good attention.  ?Speech/language: Fluent, comprehension intact. ?Cranial nerves:  ? CN II Pupils equal and reactive to light, dramatically decreased visual acuity in the R eye  ? CN III,IV,VI EOM intact, no gaze preference or deviation, no nystagmus   ? CN V Patient reports decreased sensation on the right side in V1-V2 distribution  ?  CN VII no asymmetry, no nasolabial fold flattening   ? CN VIII normal hearing to speech   ? CN IX & X normal palatal elevation, no uvular deviation   ? CN XI 5/5 head turn and 5/5 shoulder shrug bilatera

## 2021-05-12 NOTE — Assessment & Plan Note (Signed)
Acute.  Calcium elevated at 10.9. ?-Normal saline IV fluids at 75 mL/h ?

## 2021-05-12 NOTE — Assessment & Plan Note (Addendum)
-   Continue amlodipine ?

## 2021-05-12 NOTE — ED Notes (Signed)
Pt reports numbness and tingling to right face, arm, hand and leg. Onset this morning upon rising 0600. Pt states sensation present. AAOx4. Respirations even and unlabored. Vision at baseline.  ?

## 2021-05-12 NOTE — Plan of Care (Signed)
?  Problem: Education: ?Goal: Knowledge of General Education information will improve ?Description: Including pain rating scale, medication(s)/side effects and non-pharmacologic comfort measures ?Outcome: Progressing ?  ?Problem: Health Behavior/Discharge Planning: ?Goal: Ability to manage health-related needs will improve ?Outcome: Progressing ?  ?Problem: Clinical Measurements: ?Goal: Ability to maintain clinical measurements within normal limits will improve ?Outcome: Progressing ?Goal: Will remain free from infection ?Outcome: Progressing ?Goal: Diagnostic test results will improve ?Outcome: Progressing ?Goal: Respiratory complications will improve ?Outcome: Progressing ?Goal: Cardiovascular complication will be avoided ?Outcome: Progressing ?  ?Problem: Activity: ?Goal: Risk for activity intolerance will decrease ?Outcome: Progressing ?  ?Problem: Nutrition: ?Goal: Adequate nutrition will be maintained ?Outcome: Progressing ?  ?Problem: Coping: ?Goal: Level of anxiety will decrease ?Outcome: Progressing ?  ?Problem: Elimination: ?Goal: Will not experience complications related to bowel motility ?Outcome: Progressing ?Goal: Will not experience complications related to urinary retention ?Outcome: Progressing ?  ?Problem: Pain Managment: ?Goal: General experience of comfort will improve ?Outcome: Progressing ?  ?Problem: Safety: ?Goal: Ability to remain free from injury will improve ?Outcome: Progressing ?  ?Problem: Skin Integrity: ?Goal: Risk for impaired skin integrity will decrease ?Outcome: Progressing ?  ?Problem: Education: ?Goal: Knowledge of disease or condition will improve ?Outcome: Progressing ?Goal: Knowledge of secondary prevention will improve (SELECT ALL) ?Outcome: Progressing ?Goal: Knowledge of patient specific risk factors will improve (INDIVIDUALIZE FOR PATIENT) ?Outcome: Progressing ?  ?Problem: Coping: ?Goal: Will verbalize positive feelings about self ?Outcome: Progressing ?Goal: Will  identify appropriate support needs ?Outcome: Progressing ?  ?Problem: Health Behavior/Discharge Planning: ?Goal: Ability to manage health-related needs will improve ?Outcome: Progressing ?  ?Problem: Self-Care: ?Goal: Ability to participate in self-care as condition permits will improve ?Outcome: Progressing ?Goal: Verbalization of feelings and concerns over difficulty with self-care will improve ?Outcome: Progressing ?Goal: Ability to communicate needs accurately will improve ?Outcome: Progressing ?  ?Problem: Nutrition: ?Goal: Risk of aspiration will decrease ?Outcome: Progressing ?  ?Problem: Ischemic Stroke/TIA Tissue Perfusion: ?Goal: Complications of ischemic stroke/TIA will be minimized ?Outcome: Progressing ?  ?Problem: Spontaneous Subarachnoid Hemorrhage Tissue Perfusion: ?Goal: Complications of Spontaneous Subarachnoid Hemorrhage will be minimized ?Outcome: Progressing ?  ?

## 2021-05-12 NOTE — Assessment & Plan Note (Addendum)
Patient s/p cadaver kidney transplant in 2018. Creatinine 1.39 with BUN 39.  Baseline creatinine previously have been around 1.6 last on 03/28/2021 per records from [facility].  Continue prednisone, CellCept, Prograf.  Outpatient follow-up with nephrology/renal transplant team. ? ?

## 2021-05-12 NOTE — Progress Notes (Signed)
SLP Cancellation Note ? ?Patient Details ?Name: Morgan [name] [id number] ?MRN: [id number] ?DOB: 10-29-1970 ? ? ?Cancelled treatment:       Reason Eval/Treat Not Completed: Patient at procedure or test/unavailable (Pt being assessed by neurology at this time. SLP will follow up later as schedule allows.) ? ?Aamori Mcmasters I. Hardin Negus, [place], CCC-SLP ?Acute Rehabilitation Services ?Office number [phone] ?Pager [phone] ? ?Horton Marshall ?05/12/2021, 3:51 PM ?

## 2021-05-12 NOTE — Assessment & Plan Note (Deleted)
Acute.  Patient presented with complaints of paresthesias of the right side of her body upon waking this morning.  Last known normal prior to going to sleep around 11 PM.  Initial CT scan of brain negative for any acute signs of stroke, but did note small remote lacunar infarct of the posterior limb of the right internal capsule.  She is not a candidate for thrombolytics as she was out of the time window and symptoms were reported to be improving.  Question possibility of TIA versus stroke ?-Admit to a telemetry bed ?-Neurochecks ?-Check MRI, MRA of the head and neck ?-Check echocardiogram ?-Aspirin and Plavix ?-PT/OT/speech to evaluate and treat ?-Neurology consulted, we will follow-up for any further recommendation ?

## 2021-05-12 NOTE — ED Notes (Signed)
Pt states unable to void at this time, Urine cup at bedside.  ?

## 2021-05-12 NOTE — ED Provider Notes (Signed)
[facility] EMERGENCY DEPT Provider Note   CSN: [id number] Arrival date & time: 05/12/21  8756     History  Chief Complaint  Patient presents with   Numbness    Right side     Morgan [name] is a 51 y.o. female.  Patient is a 51 year old female with a history of hypertension, diabetes, end-stage renal disease on dialysis for a time but now status post transplant on immunosuppressants who is presenting today with complaints of numbness and tingling on the right side of her body since she woke up at 6 AM this morning.  She went to bed feeling normal last night.  She reports recently there is been no acute changes in her health status.  She has never had anything like this before but when she woke up this morning she was tingling.  She notices it on the right side of her face the right side of her tongue, her arm and her leg.  She denies any speech difficulty, swallowing difficulty, visual changes, no weakness noted or difficulty with gait.  She did call her cardiology office and spoke with the PA who recommend she come to the emergency room.  She has taken all of her morning medications.  The history is provided by the patient and medical records.      Home Medications Prior to Admission medications   Medication Sig Start Date End Date Taking? Authorizing Provider  amLODipine (NORVASC) 5 MG tablet Take 5 mg by mouth at bedtime. 01/03/18   [provider]  aspirin 81 MG tablet Take 81 mg by mouth daily.    [provider]  B Complex-C-Folic Acid (RENA-VITE PO) Take 1 tablet by mouth daily.    [provider]  Continuous Blood Gluc Receiver (DEXCOM G6 RECEIVER) DEVI APPLY ROUTE ONCE 03/05/19   [provider]  Continuous Blood Gluc Sensor (DEXCOM G6 SENSOR) MISC CHANGE SENSOR EVERY 10 DAYS 05/21/20   Romero Belling, MD  Continuous Blood Gluc Transmit (DEXCOM G6 TRANSMITTER) MISC USE TO CHECK BLOOD SUGAR AS DIRECTED 03/17/20   Romero Belling, MD  folic acid (FOLVITE) 1 MG tablet Take 1 mg by mouth daily. 01/03/18   [provider]  glucose blood (ONETOUCH VERIO) test strip 1 each by Other route 2 (two) times daily. And lancets 2/day 03/04/19   Romero Belling, MD  HYDROcodone-acetaminophen [facility]) 5-325 MG tablet Take 1 tablet by mouth every 6 (six) hours as needed for moderate pain. 07/18/20   Hudnall, Azucena Fallen, MD  imiquimod (ALDARA) 5 % cream USE THREE TIMES WEEKLY AT BEDTIME. WASH OFF WITH SOAP AND WATER 6 TO 10 HOURS LATER. MAX 16 WEEKS. AVOID SEXUAL CONTACT WHILE CREAM IN ON SK 11/18/17   [provider]  Insulin Lispro Prot & Lispro (HUMALOG MIX 75/25 KWIKPEN) (75-25) 100 UNIT/ML Kwikpen 25 units with breakfast, and 35 units with supper 05/10/20   Romero Belling, MD  Lancets [facility] DELICA PLUS [place]) MISC USE TWICE A DAY TO CHECK BLOOD SUGAR 03/04/19   [provider]  levonorgestrel (MIRENA) 20 MCG/24HR IUD 1 each by Intrauterine route once.    [provider]  lidocaine-prilocaine (EMLA) cream Apply 1 application topically daily as needed (dialysis).     [provider]  MM PEN NEEDLES 31G X 5 MM MISC USE  IN THE MORNING AND AT BEDTIME 02/21/21   Romero Belling, MD  mycophenolate (CELLCEPT) 250 MG capsule Take 1,000 mg by mouth 2 (two) times daily.    [provider]  PARoxetine (PAXIL) 10 MG tablet Take 10 mg by mouth daily. 06/20/20   [provider]  polyethylene glycol (MIRALAX / GLYCOLAX) packet Take 17 g by mouth daily.    [provider]  predniSONE (DELTASONE) 5 MG tablet Take 5 mg by mouth daily with breakfast.    [provider]  ranitidine (ZANTAC) 150 MG tablet Take 150 mg by mouth 2 (two) times daily.    [provider]  sevelamer carbonate (RENVELA) 800 MG tablet Take 4,000 mg by mouth 3 (three) times daily with meals.     [provider]  sodium bicarbonate 650 MG tablet Take 1,300 mg by mouth 2 (two) times daily.  07/04/20   [provider]  tacrolimus (PROGRAF) 1 MG capsule Take 9 mg by mouth 2 (two) times daily. 5 in the morning .    [provider]  valGANciclovir (VALCYTE) 450 MG tablet Take by mouth daily. 1 tab every other day.    [provider]      Allergies    Fluorescein, Iodinated contrast media, Iohexol, Latex, Egg white (egg protein), Nsaids, and Shellfish allergy    Review of Systems   Review of Systems  Physical Exam Updated Vital Signs BP 134/78   Pulse 91   Temp 97.7 F (36.5 C) (Oral)   Resp 17   Ht 5\' 2"  (1.575 m)   Wt 88 kg   SpO2 100%   BMI 35.48 kg/m  Physical Exam Vitals and nursing note reviewed.  Constitutional:      General: She is not in acute distress.    Appearance: She is well-developed.  HENT:     Head: Normocephalic and atraumatic.  Eyes:     Conjunctiva/sclera: Conjunctivae normal.     Pupils: Pupils are equal, round, and reactive to light.  Cardiovascular:     Rate and Rhythm: Normal rate and regular rhythm.     Heart sounds: No murmur heard. Pulmonary:     Effort: Pulmonary effort is normal. No respiratory distress.     Breath sounds: Normal breath sounds. No wheezing or rales.  Abdominal:     General: There is no distension.     Palpations: Abdomen is soft.     Tenderness: There is no abdominal tenderness. There is no guarding or rebound.  Musculoskeletal:        General: No tenderness. Normal range of motion.     Cervical back: Normal range of motion and neck supple.  Skin:    General: Skin is warm and dry.     Findings: No erythema or rash.  Neurological:     Mental Status: She is alert and oriented to person, place, and time.     Comments: Subjective tingling to the right side of the face arm and leg.  5 out of 5 strength in the upper and lower extremities bilaterally.  No pronator drift.  No aphasia.  Patient does have no peripheral vision in the right eye but this is chronic based on diabetic retinopathy.  No  visual field cuts on the left.  Normal finger-to-nose testing.  Gait is normal.  Psychiatric:        Behavior: Behavior normal.    ED Results / Procedures / Treatments   Labs (all labs ordered are listed, but only abnormal results are displayed) Labs Reviewed  CBC - Abnormal; Notable for the following components:      Result Value   RBC 5.15 (*)    All  other components within normal limits  COMPREHENSIVE METABOLIC PANEL - Abnormal; Notable for the following components:   CO2 20 (*)    Glucose, Bld 192 (*)    BUN 39 (*)    Creatinine, Ser 1.39 (*)    Calcium 10.5 (*)    AST 12 (*)    GFR, Estimated 46 (*)    All other components within normal limits  ETHANOL  DIFFERENTIAL  RAPID URINE DRUG SCREEN, HOSP PERFORMED  URINALYSIS, ROUTINE W REFLEX MICROSCOPIC  PROTIME-INR  APTT    EKG EKG Interpretation  Date/Time:  Friday May 12 2021 09:51:34 EDT Ventricular Rate:  95 PR Interval:  134 QRS Duration: 90 QT Interval:  348 QTC Calculation: 438 R Axis:   -27 Text Interpretation: Sinus rhythm new LVH with secondary repolarization abnormality since 2017 Confirmed by Gwyneth Sprout ([id number]) on 05/12/2021 11:05:13 AM  Radiology CT HEAD WO CONTRAST  Result Date: 05/12/2021 CLINICAL DATA:  Neuro deficit, acute, stroke suspected EXAM: CT HEAD WITHOUT CONTRAST TECHNIQUE: Contiguous axial images were obtained from the base of the skull through the vertex without intravenous contrast. RADIATION DOSE REDUCTION: This exam was performed according to the departmental dose-optimization program which includes automated exposure control, adjustment of the mA and/or kV according to patient size and/or use of iterative reconstruction technique. COMPARISON:  MRI May 09, 2012. FINDINGS: Brain: No evidence of acute large vascular territory infarction, hemorrhage, hydrocephalus, extra-axial collection or mass lesion/mass effect. Small remote lacunar infarct in the posterior limb of the right  internal capsule. Mild atrophy. Vascular: No hyperdense vessel identified. Skull: No acute fracture. Sinuses/Orbits: Right sphenoid sinus mucosal thickening with surrounding osteitis, suggestive of chronic sinusitis. Other: No mastoid effusions. IMPRESSION: 1. No evidence of acute intracranial abnormality. 2. Small remote lacunar infarct in the posterior limb of the right internal capsule. 3. Mild cerebral atrophy (ICD10-G31.9). 4. 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 Electronically Signed   By: Feliberto Harts M.D.   On: 05/12/2021 10:10    Procedures Procedures    Medications Ordered in ED Medications  clopidogrel (PLAVIX) tablet 75 mg (has no administration in time range)    ED Course/ Medical Decision Making/ A&P                           Medical Decision Making Amount and/or Complexity of Data Reviewed External Data Reviewed: notes. Labs: ordered. Decision-making details documented in ED Course. Radiology: ordered and independent interpretation performed. Decision-making details documented in ED Course. ECG/medicine tests: ordered and independent interpretation performed. Decision-making details documented in ED Course.  Risk Prescription drug management.   Patient is a 51 year old female with multiple medical problems presenting today with symptoms concerning for mild stroke.  Her symptoms are still present but she reports they are improving.  She is NIH 0 and Van negative.  Last normal was greater than 12 hours ago.  She does not meet code stroke criteria at this time.  She otherwise is well-appearing.  She denies any infectious symptoms.  Low suspicion for hypoglycemia or electrolyte abnormalities.  She has taken all of her morning medications and has not had any change in medications.  Stroke order set was initiated.  Given patient's symptoms and risk factors feel that she will meet admission criteria and need further stroke work-up with MRI.  External medical  records from her recent doctors visits were reviewed.   I independently interpreted patient's EKG and labs.  EKG shows LVH without acute changes,  CBC within normal limits, CMP without acute findings. I independently visualized and interpreted patient's head CT without any signs of intracranial bleeding, radiology reports no evidence of acute intercranial abnormality.  She does have a small remote lacunar infarct in the posterior limb of the right internal capsule and mild cerebral atrophy. Findings discussed with the patient and her family member.  Consulted neurology and Dr. Selina Cooley agree with admission. Recommended ongoing ASA 81mg  and plavix 75mg .  Consulted hospitalist for admission.        Final Clinical Impression(s) / ED Diagnoses Final diagnoses:  Cerebrovascular accident (CVA), unspecified mechanism (HCC)    Rx / DC Orders ED Discharge Orders     None         Gwyneth Sprout, MD 05/12/21 1123

## 2021-05-12 NOTE — Progress Notes (Signed)
?  Transition of Care (TOC) Screening Note ? ? ?Patient Details  ?Name: Morgan [name] [id number] ?Date of Birth: [birth year] ? ? ?Transition of Care (TOC) CM/SW Contact:    ?Pollie Friar, RN ?Phone Number: ?05/12/2021, 3:01 PM ? ? ? ?Transition of Care Department [facility]) has reviewed patient and no TOC needs have been identified at this time. We will continue to monitor patient advancement through interdisciplinary progression rounds. If new patient transition needs arise, please place a TOC consult. ?  ?

## 2021-05-12 NOTE — Progress Notes (Signed)
PHARMACIST - PHYSICIAN COMMUNICATION ? ?CONCERNING:  Enoxaparin (Lovenox) for DVT Prophylaxis  ? ? ?RECOMMENDATION: ?Patient was prescribed enoxaprin 40mg  q24 hours for VTE prophylaxis.  ? Danley Danker Weights  ? 05/12/21 8099  ?Weight: 88 kg (194 lb)  ? ? ?Body mass index is 35.48 kg/m?. ? ?Estimated Creatinine Clearance: 49.4 mL/min (A) (by C-G formula based on SCr of 1.39 mg/dL (H)). ? ? ?Based on [place] patient is candidate for enoxaparin 0.5mg /kg TBW SQ every 24 hours based on BMI being >30. ? ?DESCRIPTION: ?Pharmacy has adjusted enoxaparin dose per [facility] policy. ? ?Patient is now receiving enoxaparin 45 mg every 24 hours  ? ? ?Berta Minor, PharmD ?Clinical Pharmacist  ?05/12/2021 ?3:15 PM ? ?

## 2021-05-12 NOTE — Plan of Care (Addendum)
Transfer from [facility] to [facility].  See [facility] communication for signout. ?

## 2021-05-12 NOTE — Plan of Care (Signed)
?Problem: Education: ?Goal: Knowledge of General Education information will improve ?Description: Including pain rating scale, medication(s)/side effects and non-pharmacologic comfort measures ?05/12/2021 2349 by Brooke Bonito, RN ?Outcome: Progressing ?05/12/2021 2349 by Brooke Bonito, RN ?Outcome: Progressing ?  ?Problem: Health Behavior/Discharge Planning: ?Goal: Ability to manage health-related needs will improve ?05/12/2021 2349 by Brooke Bonito, RN ?Outcome: Progressing ?05/12/2021 2349 by Brooke Bonito, RN ?Outcome: Progressing ?  ?Problem: Clinical Measurements: ?Goal: Ability to maintain clinical measurements within normal limits will improve ?05/12/2021 2349 by Brooke Bonito, RN ?Outcome: Progressing ?05/12/2021 2349 by Brooke Bonito, RN ?Outcome: Progressing ?Goal: Will remain free from infection ?05/12/2021 2349 by Brooke Bonito, RN ?Outcome: Progressing ?05/12/2021 2349 by Brooke Bonito, RN ?Outcome: Progressing ?Goal: Diagnostic test results will improve ?05/12/2021 2349 by Brooke Bonito, RN ?Outcome: Progressing ?05/12/2021 2349 by Brooke Bonito, RN ?Outcome: Progressing ?Goal: Respiratory complications will improve ?05/12/2021 2349 by Brooke Bonito, RN ?Outcome: Progressing ?05/12/2021 2349 by Brooke Bonito, RN ?Outcome: Progressing ?Goal: Cardiovascular complication will be avoided ?05/12/2021 2349 by Brooke Bonito, RN ?Outcome: Progressing ?05/12/2021 2349 by Brooke Bonito, RN ?Outcome: Progressing ?  ?Problem: Activity: ?Goal: Risk for activity intolerance will decrease ?05/12/2021 2349 by Brooke Bonito, RN ?Outcome: Progressing ?05/12/2021 2349 by Brooke Bonito, RN ?Outcome: Progressing ?  ?Problem: Nutrition: ?Goal: Adequate nutrition will be maintained ?05/12/2021 2349 by Brooke Bonito, RN ?Outcome: Progressing ?05/12/2021 2349 by Brooke Bonito, RN ?Outcome: Progressing ?  ?Problem: Coping: ?Goal: Level of anxiety will decrease ?05/12/2021 2349 by Brooke Bonito, RN ?Outcome: Progressing ?05/12/2021 2349 by  Brooke Bonito, RN ?Outcome: Progressing ?  ?Problem: Elimination: ?Goal: Will not experience complications related to bowel motility ?05/12/2021 2349 by Brooke Bonito, RN ?Outcome: Progressing ?05/12/2021 2349 by Brooke Bonito, RN ?Outcome: Progressing ?Goal: Will not experience complications related to urinary retention ?05/12/2021 2349 by Brooke Bonito, RN ?Outcome: Progressing ?05/12/2021 2349 by Brooke Bonito, RN ?Outcome: Progressing ?  ?Problem: Pain Managment: ?Goal: General experience of comfort will improve ?05/12/2021 2349 by Brooke Bonito, RN ?Outcome: Progressing ?05/12/2021 2349 by Brooke Bonito, RN ?Outcome: Progressing ?  ?Problem: Safety: ?Goal: Ability to remain free from injury will improve ?05/12/2021 2349 by Brooke Bonito, RN ?Outcome: Progressing ?05/12/2021 2349 by Brooke Bonito, RN ?Outcome: Progressing ?  ?Problem: Skin Integrity: ?Goal: Risk for impaired skin integrity will decrease ?05/12/2021 2349 by Brooke Bonito, RN ?Outcome: Progressing ?05/12/2021 2349 by Brooke Bonito, RN ?Outcome: Progressing ?  ?Problem: Education: ?Goal: Knowledge of disease or condition will improve ?05/12/2021 2349 by Brooke Bonito, RN ?Outcome: Progressing ?05/12/2021 2349 by Brooke Bonito, RN ?Outcome: Progressing ?Goal: Knowledge of secondary prevention will improve (SELECT ALL) ?05/12/2021 2349 by Brooke Bonito, RN ?Outcome: Progressing ?05/12/2021 2349 by Brooke Bonito, RN ?Outcome: Progressing ?Goal: Knowledge of patient specific risk factors will improve (INDIVIDUALIZE FOR PATIENT) ?05/12/2021 2349 by Brooke Bonito, RN ?Outcome: Progressing ?05/12/2021 2349 by Brooke Bonito, RN ?Outcome: Progressing ?  ?Problem: Coping: ?Goal: Will verbalize positive feelings about self ?05/12/2021 2349 by Brooke Bonito, RN ?Outcome: Progressing ?05/12/2021 2349 by Brooke Bonito, RN ?Outcome: Progressing ?Goal: Will identify appropriate support needs ?05/12/2021 2349 by Brooke Bonito, RN ?Outcome: Progressing ?05/12/2021 2349 by Brooke Bonito, RN ?Outcome: Progressing ?  ?Problem: Health Behavior/Discharge Planning: ?Goal: Ability to manage health-related needs will improve ?05/12/2021 2349 by Brooke Bonito, RN ?Outcome: Progressing ?05/12/2021 2349 by Brooke Bonito, RN ?Outcome: Progressing ?  ?Problem: Self-Care: ?Goal: Ability to participate in self-care as condition permits will improve ?05/12/2021 2349 by Brooke Bonito, RN ?Outcome: Progressing ?05/12/2021 2349 by Brooke Bonito, RN ?Outcome: Progressing ?Goal: Verbalization  of feelings and concerns over difficulty with self-care will improve ?05/12/2021 2349 by Brooke Bonito, RN ?Outcome: Progressing ?05/12/2021 2349 by Brooke Bonito, RN ?Outcome: Progressing ?Goal: Ability to communicate needs accurately will improve ?05/12/2021 2349 by Brooke Bonito, RN ?Outcome: Progressing ?05/12/2021 2349 by Brooke Bonito, RN ?Outcome: Progressing ?  ?Problem: Nutrition: ?Goal: Risk of aspiration will decrease ?05/12/2021 2349 by Brooke Bonito, RN ?Outcome: Progressing ?05/12/2021 2349 by Brooke Bonito, RN ?Outcome: Progressing ?  ?Problem: Ischemic Stroke/TIA Tissue Perfusion: ?Goal: Complications of ischemic stroke/TIA will be minimized ?05/12/2021 2349 by Brooke Bonito, RN ?Outcome: Progressing ?05/12/2021 2349 by Brooke Bonito, RN ?Outcome: Progressing ?  ?Problem: Spontaneous Subarachnoid Hemorrhage Tissue Perfusion: ?Goal: Complications of Spontaneous Subarachnoid Hemorrhage will be minimized ?05/12/2021 2349 by Brooke Bonito, RN ?Outcome: Progressing ?05/12/2021 2349 by Brooke Bonito, RN ?Outcome: Progressing ?  ?

## 2021-05-12 NOTE — Plan of Care (Signed)

## 2021-05-12 NOTE — Progress Notes (Signed)
IV TEAM consult received for PIV placement. Nurse requesting EJ or IJ to be placed due to difficulty in poor vasculature. This nurse secure chatted with Thais RN and Dr. Tamala Julian. It was agreed to have IV team assess for possible PIV placement. However at this time patient is going to MRI. Instructed nurse to reconsult IV team when patient back in bed and agreeable for IV team to assess for PIV placement. VU. Fran Lowes, RN VAST ?

## 2021-05-13 ENCOUNTER — Inpatient Hospital Stay (HOSPITAL_COMMUNITY): Payer: BC Managed Care – PPO

## 2021-05-13 DIAGNOSIS — I63332 Cerebral infarction due to thrombosis of left posterior cerebral artery: Secondary | ICD-10-CM | POA: Diagnosis not present

## 2021-05-13 DIAGNOSIS — E785 Hyperlipidemia, unspecified: Secondary | ICD-10-CM | POA: Diagnosis present

## 2021-05-13 DIAGNOSIS — N1831 Chronic kidney disease, stage 3a: Secondary | ICD-10-CM | POA: Diagnosis not present

## 2021-05-13 DIAGNOSIS — I639 Cerebral infarction, unspecified: Secondary | ICD-10-CM | POA: Diagnosis not present

## 2021-05-13 DIAGNOSIS — I6389 Other cerebral infarction: Secondary | ICD-10-CM

## 2021-05-13 DIAGNOSIS — F339 Major depressive disorder, recurrent, unspecified: Secondary | ICD-10-CM | POA: Diagnosis not present

## 2021-05-13 DIAGNOSIS — E1122 Type 2 diabetes mellitus with diabetic chronic kidney disease: Secondary | ICD-10-CM | POA: Diagnosis not present

## 2021-05-13 DIAGNOSIS — I10 Essential (primary) hypertension: Secondary | ICD-10-CM | POA: Diagnosis not present

## 2021-05-13 LAB — LIPID PANEL
Cholesterol: 124 mg/dL (ref 0–200)
HDL: 37 mg/dL — ABNORMAL LOW (ref >40)
LDL Cholesterol: 76 mg/dL (ref 0–99)
Total CHOL/HDL Ratio: 3.4 RATIO
Triglycerides: 54 mg/dL (ref <150)
VLDL: 11 mg/dL (ref 0–40)

## 2021-05-13 LAB — ECHOCARDIOGRAM COMPLETE
Area-P 1/2: 6.37 cm2
Height: 62 in
S' Lateral: 3.2 cm
Weight: 3104 oz

## 2021-05-13 LAB — GLUCOSE, CAPILLARY
Glucose-Capillary: 159 mg/dL — ABNORMAL HIGH (ref 70–99)
Glucose-Capillary: 260 mg/dL — ABNORMAL HIGH (ref 70–99)

## 2021-05-13 LAB — HEMOGLOBIN A1C
Hgb A1c MFr Bld: 10.2 % — ABNORMAL HIGH (ref 4.8–5.6)
Mean Plasma Glucose: 246.04 mg/dL

## 2021-05-13 LAB — TSH: TSH: 2.685 u[IU]/mL (ref 0.350–4.500)

## 2021-05-13 MED ORDER — ROSUVASTATIN CALCIUM 20 MG PO TABS: 20.0000 mg | ORAL_TABLET | Freq: Every day | ORAL | 0 refills | Status: DC

## 2021-05-13 MED ORDER — ASPIRIN 81 MG PO CHEW
81.0000 mg | CHEWABLE_TABLET | Freq: Every day | ORAL | 0 refills | Status: AC
Start: 2021-05-14 — End: 2021-06-04

## 2021-05-13 MED ORDER — CLOPIDOGREL BISULFATE 75 MG PO TABS: 75.0000 mg | ORAL_TABLET | Freq: Every day | ORAL | 2 refills | Status: AC

## 2021-05-13 MED ORDER — ROSUVASTATIN CALCIUM 20 MG PO TABS: 20.0000 mg | ORAL_TABLET | Freq: Every day | ORAL | Status: DC

## 2021-05-13 NOTE — Progress Notes (Signed)
Pt refusing lovenox. Dr. Marlowe Sax notified ?

## 2021-05-13 NOTE — Assessment & Plan Note (Signed)
Last creatinine care everywhere 1.6.  Creatinine 1.39 on admission, stable.  Outpatient follow-up with nephrology/transplant team. ?

## 2021-05-13 NOTE — Discharge Instructions (Signed)
Continue aspirin and Plavix for 3 weeks followed by Plavix alone ?Discontinue pravastatin in favor of Crestor ?

## 2021-05-13 NOTE — Progress Notes (Addendum)
0800 Med admin, patient refused AM insulin coverage, stated they take [place] for home insulin coverage, pharmacy consulted. Per hospital policy pharmacy cannot dispense home insulin brand. Educated patient on hospital policy regarding self administration of home medications. MD notified. ?

## 2021-05-13 NOTE — Discharge Summary (Signed)
?Physician Discharge Summary  ?Morgan [name] URK:[id number] DOB: 1970/06/16 DOA: 05/12/2021 ? ?PCP: Awanda Mink, MD ? ?Admit date: 05/12/2021 ?Discharge date: 05/13/2021 ? ?Admitted From: Home ?Disposition: Home ? ?Recommendations for Outpatient Follow-up:  ?Follow up with PCP in 1-2 weeks ?Follow-up with neurology stroke clinic 4 weeks ?Continue aspirin and Plavix for 3 weeks followed by Plavix alone ?Discontinue pravastatin in favor of Crestor 20 mg p.o. daily ?Follow-up with lipid panel, hemoglobin A1c and TSH that were pending at time of discharge ? ?Home Health: No ?Equipment/Devices: None ? ?Discharge Condition: Stable ?CODE STATUS: Full code ?Diet recommendation: Heart healthy/consistent carbohydrate diet ? ?History of present illness: ? ?Morgan [name] is a 51 y.o. 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  Patient reports falling asleep last night around 11 PM.  She had been feeling fatigued with low energy, but attributed symptoms to recently stressing over continues for an upcoming event.  She had started taking vitamin B supplements to see if it would help increase her energy.  This morning when she first woke up she thought the numbness was secondary to her sleeping wrong.  The symptoms were different it involved her entire right side from her face and tongue all the way down to her feet.  Denies having any significant headache, palpitations, chest pain, change in vision, or focal weakness.  She is on a daily aspirin to keep hemodialysis access open. ?  ?In the ED, afebrile with vital signs stable. CT scan of the head negative for any acute intercranial abnormality and a small remote  lacunar infarct of the posterior limb of the right internal capsule. Labs significant for BUN 39, creatinine 1.39, glucose 192, and calcium 10.5.  She is not a candidate for tPA due to symptoms resolving and patient being out of the time window.  Neurology have been notified and recommended admission for completion of stroke work-up and to start the patient on Plavix.  [facility] called to admit. ? ?Hospital course: ? ?Assessment and Plan: ?* CVA (cerebral vascular accident) (Morgan [name]) ?Patient presented with complaints of paresthesias of the right side of her body upon waking this morning.  Last known normal prior to going to sleep around 11 PM.  Initial CT scan of brain negative for any acute signs of stroke, but did note small remote lacunar infarct of the posterior limb of the right internal capsule.  MR brain with small acute infarct left thalamus without hemorrhage or mass effect.  MRA head with multifocal severe stenosis or short segment occlusion predominantly within the posterior circulation, advanced atrophy and chronic small vessel ischemic disease.  MRA neck normal.  TTE with LVEF 83-15%, grade 2 diastolic dysfunction, no interatrial shunt noted.  She is not a candidate for thrombolytics as she was out of the time window and symptoms now resolved.  Neurology was consulted and followed during hospital course.  Seen by PT/OT/SLP with no needs identified.  Continue dual antiplatelet therapy with aspirin 81 mg p.o. daily, Plavix 75 mg p.o. daily for 3 weeks followed by Plavix alone.  Started on Crestor 20 mg p.o. daily.  Outpatient follow-up with neurology 4 weeks. ? ?CKD (chronic kidney disease), stage III (Morgan [name]) ?Last creatinine care everywhere 1.6.  Creatinine 1.39 on admission, stable.  Outpatient follow-up with nephrology/transplant team. ? ?  S/p cadaver renal transplant CKD stage IIIa ?Patient s/p cadaver kidney transplant in 2018. Creatinine 1.39 with BUN 39.  Baseline creatinine previously have been around 1.6  last on 03/28/2021 per records from [place].  Continue prednisone, CellCept, Prograf.  Outpatient follow-up with nephrology/renal transplant team. ? ? ?Diabetes mellitus, type 2 (Morgan [name]) ? Patient is followed by endocrinology in the outpatient setting.  Insulin regimen includes Tresiba 32 units twice daily.   ? ?Essential (primary) hypertension ?Continue amlodipine ? ?Depression, major, recurrent (Morgan [name]) ?Continue home Paxil ? ?Hyperlipidemia ?Recently started on pravastatin 20 mg p.o. daily by her endocrinologist.  Given elevated LDL and acute stroke, pravastatin changed to Crestor 20 mg p.o. daily. ? ? ? ? ? ? ?Discharge Diagnoses:  ?Principal Problem: ?  CVA (cerebral vascular accident) (Morgan [name]) ?Active Problems: ?  CKD (chronic kidney disease), stage III (Morgan [name]) ?  S/p cadaver renal transplant CKD stage IIIa ?  Diabetes mellitus, type 2 (Morgan [name]) ?  Essential (primary) hypertension ?  Depression, major, recurrent (Morgan [name]) ?  Hyperlipidemia ? ? ? ?Discharge Instructions ? ?Discharge Instructions   ? ? Ambulatory referral to Neurology   Complete by: As directed ?  ? An appointment is requested in approximately: 4 weeks  ? Call MD for:  difficulty breathing, headache or visual disturbances   Complete by: As directed ?  ? Call MD for:  extreme fatigue   Complete by: As directed ?  ? Call MD for:  persistant dizziness or light-headedness   Complete by: As directed ?  ? Call MD for:  persistant nausea and vomiting   Complete by: As directed ?  ? Call MD for:  severe uncontrolled pain   Complete by: As directed ?  ? Call MD for:  temperature >100.4   Complete by: As directed ?  ? Diet - low sodium heart healthy   Complete by: As directed ?  ? Increase activity slowly   Complete by: As directed ?  ? ?  ? ?Allergies as of 05/13/2021   ? ?   Reactions  ? Fluorescein Shortness Of Breath, Other (See Comments)  ? Hypertension  ? Iodinated Contrast Media Anaphylaxis, Hives, Other (See Comments)  ? Diffuse swelling  ? Iohexol  Anaphylaxis, Hives, Itching, Swelling, Other (See Comments)  ? 13 HR PRE-MEDS REQUIRED  ? Latex Itching  ? Egg White (egg Protein)   ? Other reaction(s): Other (See Comments) ?CANNOT TAKE DUE TO AUTO IMMUNE DISORDER  ?CANNOT TAKE DUE TO AUTO IMMUNE DISORDER  ?CANNOT TAKE DUE TO AUTO IMMUNE DISORDER   ? Nsaids Other (See Comments)  ? CANNOT TAKE DUE TO KIDNEY  ?CANNOT TAKE DUE TO KIDNEY  ?CANNOT TAKE DUE TO KIDNEY   ? Shellfish Allergy   ? Other reaction(s): Other (See Comments) ?CANNOT TAKE DUE TO KIDNEY TRANSPLANT  ?CANNOT TAKE DUE TO KIDNEY TRANSPLANT  ?CANNOT TAKE DUE TO KIDNEY TRANSPLANT   ? ?  ? ?  ?Medication List  ?  ? ?STOP taking these medications   ? ?aspirin 81 MG tablet ?Replaced by: aspirin 81 MG chewable tablet ?  ?Insulin Lispro Prot & Lispro (75-25) 100 UNIT/ML Kwikpen ?Commonly known as: HumaLOG Mix 75/25 KwikPen ?  ? ?  ? ?TAKE these medications   ? ?amLODipine 5 MG tablet ?Commonly known as: NORVASC ?Take 5 mg by mouth at bedtime. ?  ?aspirin 81 MG chewable tablet ?Chew 1 tablet (81 mg total) by mouth daily for 21 days. ?Start taking on: May 14, 2021 ?Replaces: aspirin 81  MG tablet ?  ?clopidogrel 75 MG tablet ?Commonly known as: PLAVIX ?Take 1 tablet (75 mg total) by mouth daily. ?Start taking on: May 14, 2021 ?  ?Dexcom G6 Receiver Devi ?APPLY ROUTE ONCE ?  ?Dexcom G6 Sensor Misc ?CHANGE SENSOR EVERY 10 DAYS ?  ?Dexcom G6 Transmitter Misc ?USE TO CHECK BLOOD SUGAR AS DIRECTED ?  ?folic acid 1 MG tablet ?Commonly known as: FOLVITE ?Take 1 mg by mouth every evening. ?  ?HYDROcodone-acetaminophen 5-325 MG tablet ?Commonly known as: Norco ?Take 1 tablet by mouth every 6 (six) hours as needed for moderate pain. ?  ?imiquimod 5 % cream ?Commonly known as: ALDARA ?USE THREE TIMES WEEKLY AT BEDTIME. [place] OFF WITH SOAP AND WATER 6 TO 10 HOURS LATER. MAX 16 WEEKS. AVOID SEXUAL CONTACT WHILE CREAM IN ON SK ?  ?levonorgestrel 20 MCG/24HR IUD ?Commonly known as: MIRENA ?1 each by Intrauterine route  once. ?  ?lidocaine-prilocaine cream ?Commonly known as: EMLA ?Apply 1 application topically daily as needed (dialysis). ?  ?MM Pen Needles 31G X 5 MM Misc ?Generic drug: Insulin Pen Needle ?USE  IN THE MORNING AND AT BEDTIME

## 2021-05-13 NOTE — Progress Notes (Signed)
OT Cancellation Note ? ?Patient Details ?Name: Morgan [name] [id number] ?MRN: [id number] ?DOB: September 11, 1970 ? ? ?Cancelled Treatment:    Reason Eval/Treat Not Completed: OT screened, no needs identified, will sign off Per PT, pt is at baseline functionally. PT has educated pt on sensory precautions due to residual numbness in R fingers and RLE. No OT needs identified at this time. Please re-consult should pt have a change in status.  ? ?Helene Kelp OTR/L ?Acute Rehabilitation Services ?Office: [phone] ? ? ?[name] ?05/13/2021, 10:09 AM ?

## 2021-05-13 NOTE — Assessment & Plan Note (Addendum)
Patient presented with complaints of paresthesias of the right side of her body upon waking this morning.  Last known normal prior to going to sleep around 11 PM.  Initial CT scan of brain negative for any acute signs of stroke, but did note small remote lacunar infarct of the posterior limb of the right internal capsule.  MR brain with small acute infarct left thalamus without hemorrhage or mass effect.  MRA head with multifocal severe stenosis or short segment occlusion predominantly within the posterior circulation, advanced atrophy and chronic small vessel ischemic disease.  MRA neck normal.  TTE with LVEF 84-85%, grade 2 diastolic dysfunction, no interatrial shunt noted.  She is not a candidate for thrombolytics as she was out of the time window and symptoms now resolved.  Neurology was consulted and followed during hospital course.  Seen by PT/OT/SLP with no needs identified.  Continue dual antiplatelet therapy with aspirin 81 mg p.o. daily, Plavix 75 mg p.o. daily for 3 weeks followed by Plavix alone.  Started on Crestor 20 mg p.o. daily.  Outpatient follow-up with neurology 4 weeks. ?

## 2021-05-13 NOTE — Progress Notes (Signed)
At 10am medication pass, patient stated they had already taken aspirin, mycophenolate, prednisone and tacrolimus that they brought from home. Reeducated patient on hospital policy on taking medications from home. MD notified.  ?

## 2021-05-13 NOTE — Progress Notes (Signed)
STROKE TEAM PROGRESS NOTE  ? ?SUBJECTIVE (INTERVAL HISTORY) ?Her husband is at the bedside.  Overall her condition is gradually improving.  She is still complaining of right sided hemiparesthesia, but better than yesterday.  She denies any stroke in the past. ? ? ?OBJECTIVE ?Temp:  [97.5 ?F (36.4 ?C)-99 ?F (37.2 ?C)] 98.7 ?F (37.1 ?C) (03/18 1352) ?Pulse Rate:  [84-93] 87 (03/18 1352) ?Cardiac Rhythm: Normal sinus rhythm;Bundle branch block (03/18 0731) ?Resp:  [16-20] 16 (03/18 1352) ?BP: (89-119)/(55-94) 119/55 (03/18 1352) ?SpO2:  [96 %-100 %] 99 % (03/18 1352) ? ?Recent Labs  ?Lab 05/12/21 ?2129 05/13/21 ?0630 05/13/21 ?1350  ?GLUCAP 192* 159* 260*  ? ?Recent Labs  ?Lab 05/12/21 ?1012  ?NA 139  ?K 4.8  ?CL 109  ?CO2 20*  ?GLUCOSE 192*  ?BUN 39*  ?CREATININE 1.39*  ?CALCIUM 10.5*  ? ?Recent Labs  ?Lab 05/12/21 ?1012  ?AST 12*  ?ALT 15  ?ALKPHOS 64  ?BILITOT 0.6  ?PROT 7.2  ?ALBUMIN 4.0  ? ?Recent Labs  ?Lab 05/12/21 ?1012  ?WBC 7.6  ?NEUTROABS 4.4  ?HGB 14.0  ?HCT 44.4  ?MCV 86.2  ?PLT 333  ? ?No results for input(s): CKTOTAL, CKMB, CKMBINDEX, TROPONINI in the last 168 hours. ?No results for input(s): LABPROT, INR in the last 72 hours. ?No results for input(s): COLORURINE, LABSPEC, [place], GLUCOSEU, HGBUR, BILIRUBINUR, KETONESUR, PROTEINUR, UROBILINOGEN, NITRITE, LEUKOCYTESUR in the last 72 hours. ? ?Invalid input(s): APPERANCEUR  ?   ?Component Value Date/Time  ? CHOL 124 05/13/2021 1238  ? TRIG 54 05/13/2021 1238  ? HDL 37 (L) 05/13/2021 1238  ? CHOLHDL 3.4 05/13/2021 1238  ? VLDL 11 05/13/2021 1238  ? [place] 76 05/13/2021 1238  ? ?Lab Results  ?Component Value Date  ? HGBA1C 10.2 (H) 05/13/2021  ? ?   ?Component Value Date/Time  ? [employer] DETECTED 05/09/2012 0532  ? COCAINSCRNUR NONE DETECTED 05/09/2012 0532  ? LABBENZ NONE DETECTED 05/09/2012 0532  ? AMPHETMU NONE DETECTED 05/09/2012 0532  ? THCU NONE DETECTED 05/09/2012 0532  ? LABBARB NONE DETECTED 05/09/2012 0532  ?  ?Recent Labs  ?Lab  05/12/21 ?1012  ?ETH <10  ? ? ?I have personally reviewed the radiological images below and agree with the radiology interpretations. ? ?CT HEAD WO CONTRAST ? ?Result Date: 05/12/2021 ?CLINICAL DATA:  Neuro deficit, acute, stroke suspected EXAM: CT HEAD WITHOUT CONTRAST TECHNIQUE: Contiguous axial images were obtained from the base of the skull through the vertex without intravenous contrast. RADIATION DOSE REDUCTION: This exam was performed according to the departmental dose-optimization program which includes automated exposure control, adjustment of the mA and/or kV according to patient size and/or use of iterative reconstruction technique. COMPARISON:  MRI May 09, 2012. FINDINGS: Brain: No evidence of acute large vascular territory infarction, hemorrhage, hydrocephalus, extra-axial collection or mass lesion/mass effect. Small remote lacunar infarct in the posterior limb of the right internal capsule. Mild atrophy. Vascular: No hyperdense vessel identified. Skull: No acute fracture. Sinuses/Orbits: Right sphenoid sinus mucosal thickening with surrounding osteitis, suggestive of chronic sinusitis. Other: No mastoid effusions. IMPRESSION: 1. No evidence of acute intracranial abnormality. 2. Small remote lacunar infarct in the posterior limb of the right internal capsule. 3. Mild cerebral atrophy (ICD10-G31.9). 4. 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 Electronically Signed   By: Margaretha Sheffield M.D.   On: 05/12/2021 10:10  ? ?MR ANGIO HEAD WO CONTRAST ? ?Result Date: 05/12/2021 ?CLINICAL DATA:  Transient ischemic attack follow-up EXAM: MRI HEAD WITHOUT CONTRAST MRA HEAD WITHOUT CONTRAST  TECHNIQUE: Multiplanar, multi-echo pulse sequences of the brain and surrounding structures were acquired without intravenous contrast. Angiographic images of the Circle of Willis were acquired using MRA technique without intravenous contrast. COMPARISON:  05/09/2012 FINDINGS: MRI HEAD FINDINGS Brain: There is a  small acute infarct of the left thalamus. No other diffusion abnormality. No acute or chronic hemorrhage. Hyperintense T2-weighted signal is moderately widespread throughout the white matter. Advanced atrophy for age. The midline structures are normal. Vascular: Major flow voids are preserved. Skull and upper cervical spine: Normal calvarium and skull base. Visualized upper cervical spine and soft tissues are normal. Sinuses/Orbits:No paranasal sinus fluid levels or advanced mucosal thickening. No mastoid or middle ear effusion. Normal orbits. MRA HEAD FINDINGS POSTERIOR CIRCULATION: --Vertebral arteries: Normal --Inferior cerebellar arteries: Normal. --Basilar artery: There is multifocal severe stenosis with areas of potential short segment occlusion in the mid basilar. --Superior cerebellar arteries: Normal left SCA. Short segment occlusion or high-grade stenosis of the proximal right SCA. --Posterior cerebral arteries: Short segment occlusion or high-grade stenosis of the proximal right P1 segment. Severe stenosis of the proximal right P2 segment. ANTERIOR CIRCULATION: --Intracranial internal carotid arteries: Normal. --Anterior cerebral arteries (ACA): Multifocal severe stenosis or short segment occlusion. --Middle cerebral arteries (MCA): Severe stenosis of the distal left M1 segment. Multiple severe stenoses of the distal branches beyond M2. ANATOMIC VARIANTS: Fetal origin of both PCAs. IMPRESSION: 1. Small acute infarct of the left thalamus. No hemorrhage or mass effect. 2. Multifocal severe stenosis or short segment occlusion predominantly within the posterior circulation. Both PCAs are of fetal origin. 3. Advanced atrophy and chronic small vessel ischemic disease. Electronically Signed   By: Ulyses Jarred M.D.   On: 05/12/2021 19:42  ? ?MR ANGIO NECK WO CONTRAST ? ?Result Date: 05/12/2021 ?CLINICAL DATA:  Right-sided numbness EXAM: MRA NECK WITHOUT CONTRAST TECHNIQUE: Angiographic images of the neck were  acquired using MRA technique without intravenous contrast. Carotid stenosis measurements (when applicable) are obtained utilizing NASCET criteria, using the distal internal carotid diameter as the denominator. COMPARISON:  No pertinent prior exam. FINDINGS: Aortic arch: There is a normal variant aortic arch branching pattern with the brachiocephalic and left common carotid arteries sharing a common origin. Right carotid system: No stenosis or occlusion Left carotid system: No stenosis or occlusion Vertebral arteries: Codominant.  No occlusion or stenosis. Other: None. IMPRESSION: Normal MRA of the neck. Electronically Signed   By: Ulyses Jarred M.D.   On: 05/12/2021 19:48  ? ?MR BRAIN WO CONTRAST ? ?Result Date: 05/12/2021 ?CLINICAL DATA:  Transient ischemic attack follow-up EXAM: MRI HEAD WITHOUT CONTRAST MRA HEAD WITHOUT CONTRAST TECHNIQUE: Multiplanar, multi-echo pulse sequences of the brain and surrounding structures were acquired without intravenous contrast. Angiographic images of the Circle of Willis were acquired using MRA technique without intravenous contrast. COMPARISON:  05/09/2012 FINDINGS: MRI HEAD FINDINGS Brain: There is a small acute infarct of the left thalamus. No other diffusion abnormality. No acute or chronic hemorrhage. Hyperintense T2-weighted signal is moderately widespread throughout the white matter. Advanced atrophy for age. The midline structures are normal. Vascular: Major flow voids are preserved. Skull and upper cervical spine: Normal calvarium and skull base. Visualized upper cervical spine and soft tissues are normal. Sinuses/Orbits:No paranasal sinus fluid levels or advanced mucosal thickening. No mastoid or middle ear effusion. Normal orbits. MRA HEAD FINDINGS POSTERIOR CIRCULATION: --Vertebral arteries: Normal --Inferior cerebellar arteries: Normal. --Basilar artery: There is multifocal severe stenosis with areas of potential short segment occlusion in the mid basilar. --Superior  cerebellar arteries:  Normal left SCA. Short segment occlusion or high-grade stenosis of the proximal right SCA. --Posterior cerebral arteries: Short segment occlusion or high-grade stenosis of the proximal right P1 se

## 2021-05-13 NOTE — Hospital Course (Signed)
Morgan [name] is a 51 y.o. 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  Patient reports falling asleep last night around 11 PM.  She had been feeling fatigued with low energy, but attributed symptoms to recently stressing over continues for an upcoming event.  She had started taking vitamin B supplements to see if it would help increase her energy.  This morning when she first woke up she thought the numbness was secondary to her sleeping wrong.  The symptoms were different it involved her entire right side from her face and tongue all the way down to her feet.  Denies having any significant headache, palpitations, chest pain, change in vision, or focal weakness.  She is on a daily aspirin to keep hemodialysis access open. ?  ?In the ED, afebrile with vital signs stable. 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 Labs significant for BUN 39, creatinine 1.39, glucose 192, and calcium 10.5.  She is not a candidate for tPA due to symptoms resolving and patient being out of the time window.  Neurology have been notified and recommended admission for completion of stroke work-up and to start the patient on Plavix.  [facility] called to admit. ?

## 2021-05-13 NOTE — Progress Notes (Incomplete)
?  2D Echocardiogram has been performed. ? ?Elmer Ramp ?05/13/2021, 11:34 AM ?

## 2021-05-13 NOTE — Assessment & Plan Note (Signed)
Recently started on pravastatin 20 mg p.o. daily by her endocrinologist.  Given elevated LDL and acute stroke, pravastatin changed to Crestor 20 mg p.o. daily. ?

## 2021-05-13 NOTE — Evaluation (Signed)
Physical Therapy Evaluation ?Patient Details ?Name: Morgan [name] [id number] ?MRN: [id number] ?DOB: 1970-11-18 ?Today's Date: 05/13/2021 ? ?History of Present Illness ? Pt is a 51 year old female who presented to the ED with complaints of numbness and tingling on the right side of her body. MRI showed small infarct L thalamus. PMH: hypertension, diabetes, end-stage renal disease on dialysis for a time but now status post transplant on immunosuppressants.  ?Clinical Impression ? Patient evaluated by Physical Therapy with no further acute PT needs identified. All education has been completed and the patient has no further questions. Pt received in bed, spouse present. Pt independent with mobility. Has some remaining numbness R distal fingers and R distal lateral leg and foot. Discussed sensory precautions. Pt ambulated safely in hallway at various paces and made changes in head position and direction without LOB or dizziness. Pt performed stairs without difficulty.  See below for any follow-up Physical Therapy or equipment needs. PT is signing off. Thank you for this referral. ?   ?   ? ?Recommendations for follow up therapy are one component of a multi-disciplinary discharge planning process, led by the attending physician.  Recommendations may be updated based on patient status, additional functional criteria and insurance authorization. ? ?Follow Up Recommendations No PT follow up ? ?  ?Assistance Recommended at Discharge None  ?Patient can return home with the following ?   ? ?  ?Equipment Recommendations None recommended by PT  ?Recommendations for Other Services ?    ?  ?Functional Status Assessment Patient has not had a recent decline in their functional status  ? ?  ?Precautions / Restrictions Precautions ?Precautions: None ?Restrictions ?Weight Bearing Restrictions: No  ? ?  ? ?Mobility ? Bed Mobility ?Overal bed mobility: Independent ?  ?  ?  ?  ?  ?  ?  ?  ? ?Transfers ?Overall transfer level: Independent ?  ?   ?  ?  ?  ?  ?  ?  ?  ?  ? ?Ambulation/Gait ?Ambulation/Gait assistance: Independent ?Gait Distance (Feet): 400 Feet ?Assistive device: None ?Gait Pattern/deviations: WFL(Within Functional Limits) ?Gait velocity: WFL ?Gait velocity interpretation: >4.37 ft/sec, indicative of normal walking speed ?  ?General Gait Details: pt ambulates safely at various paces, able to perform head turns, change directions, and navigate obstacles ? ?Stairs ?Stairs: Yes ?Stairs assistance: Independent ?Stair Management: No rails, Alternating pattern, Forwards ?Number of Stairs: 5 ?General stair comments: no difficulty with stairs ? ?Wheelchair Mobility ?  ? ?Modified Rankin (Stroke Patients Only) ?Modified Rankin (Stroke Patients Only) ?Pre-Morbid Rankin Score: No symptoms ?Modified Rankin: No significant disability ? ?  ? ?Balance Overall balance assessment: Modified Independent ?  ?  ?  ?  ?  ?  ?  ?  ?  ?  ?  ?  ?  ?  ?  ?  ?  ?  ?   ? ? ? ?Pertinent Vitals/Pain Pain Assessment ?Pain Assessment: No/denies pain  ? ? ?Home Living Family/patient expects to be discharged to:: Private residence ?Living Arrangements: Spouse/significant other ?Available Help at Discharge: Family;Available PRN/intermittently ?Type of Home: House ?Home Access: Stairs to enter ?Entrance Stairs-Rails: None ?Entrance Stairs-Number of Steps: 3 ?  ?Home Layout: Two level;Able to live on main level with bedroom/bathroom ?Home Equipment: None ?Additional Comments: husband works 1-10p, pt is a Cabin crew  ?  ?Prior Function Prior Level of Function : Independent/Modified Independent;Driving;Working/employed ?  ?  ?  ?  ?  ?  ?  ?  ?  ? ? ?  Hand Dominance  ? Dominant Hand: Left ? ?  ?Extremity/Trunk Assessment  ? Upper Extremity Assessment ?Upper Extremity Assessment: RUE deficits/detail ?RUE Deficits / Details: mild numbness distal fingers ?RUE Sensation: decreased light touch ?  ? ?Lower Extremity Assessment ?Lower Extremity Assessment: RLE deficits/detail ?RLE Deficits  / Details: numbness R lateral lower leg, discussed leg and foot checks which she already does due to DM ?RLE Sensation: decreased light touch ?  ? ?Cervical / Trunk Assessment ?Cervical / Trunk Assessment: Normal  ?Communication  ? Communication: No difficulties  ?Cognition Arousal/Alertness: Awake/alert ?Behavior During Therapy: [facility] for tasks assessed/performed ?Overall Cognitive Status: Within Functional Limits for tasks assessed ?  ?  ?  ?  ?  ?  ?  ?  ?  ?  ?  ?  ?  ?  ?  ?  ?  ?  ?  ? ?  ?General Comments General comments (skin integrity, edema, etc.): reviewed brain recovery and safety precautions for sensory deficits ? ?  ?Exercises    ? ?Assessment/Plan  ?  ?PT Assessment Patient does not need any further PT services  ?PT Problem List   ? ?   ?  ?PT Treatment Interventions     ? ?PT Goals (Current goals can be found in the Care Plan section)  ?Acute Rehab PT Goals ?Patient Stated Goal: return to home and work ?PT Goal Formulation: All assessment and education complete, DC therapy ? ?  ?Frequency   ?  ? ? ?Co-evaluation   ?  ?  ?  ?  ? ? ?  ?AM-PAC PT "6 Clicks" Mobility  ?Outcome Measure Help needed turning from your back to your side while in a flat bed without using bedrails?: None ?Help needed moving from lying on your back to sitting on the side of a flat bed without using bedrails?: None ?Help needed moving to and from a bed to a chair (including a wheelchair)?: None ?Help needed standing up from a chair using your arms (e.g., wheelchair or bedside chair)?: None ?Help needed to walk in hospital room?: None ?Help needed climbing 3-5 steps with a railing? : None ?6 Click Score: 24 ? ?  ?End of Session   ?Activity Tolerance: Patient tolerated treatment well ?Patient left: in bed;with call bell/phone within reach;with family/visitor present ?Nurse Communication: Mobility status (mod I) ?PT Visit Diagnosis: Unsteadiness on feet (R26.81) ?  ? ?Time: 8032-1224 ?PT Time Calculation (min) (ACUTE ONLY): 29  min ? ? ?Charges:   PT Evaluation ?$PT Eval Moderate Complexity: 1 Mod ?PT Treatments ?$Gait Training: 8-22 mins ?  ?   ? ? ?Leighton Roach, PT  ?Acute Rehab Services ? Pager [phone] ?Office [phone] ? ? ?[place] ?05/13/2021, 9:32 AM ? ?

## 2021-06-27 NOTE — Patient Instructions (Addendum)
Below is our plan: ? 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 Continue clopidogrel 75 mg daily  and rosuvastatin 20mg  daily for secondary stroke prevention.  Discussed secondary stroke prevention measures and importance of close PCP follow up for aggressive stroke risk factor management. I have gone over the pathophysiology of stroke, warning signs and symptoms, risk factors and their management in some detail with instructions to go to the closest emergency room for symptoms of concern. ?HTN: BP goal <130/90.  Elevated in the office, today. 176/95, reading of right leg due to bilateral UE grafts. Continue amlodipine 5mg  daily per PCP. Continue to monitor.  ?HLD: LDL goal <70. Recent LDL 76. Continue rosuvastatin 20mg  daily per PCP.  ?DMII: A1c goal<7.0. Recent A1c 10.2. uncontrolled. Continue insulin per endocrinology. Continue close follow up with PCP.  ?Imbalance: will refer to PT for evaluation. Inpatient PT did not recommend continued therapy, however, patient reports balance is impaired since being home.  ?ESRD post transplant: continue close follow up with nephrology ? ?Please make sure you are staying well hydrated. I recommend 50-60 ounces daily. Well balanced diet and regular exercise encouraged. Consistent sleep schedule with 6-8 hours recommended.  ? ?Please continue follow up with care team as directed.  ? ?Follow up with me in 4-6 months  ? ?You may receive a survey regarding today's visit. I encourage you to leave honest feed back as I do use this information to improve patient care. Thank you for seeing me today!  ? ? ?

## 2021-06-27 NOTE — Progress Notes (Signed)
?Morgan [name] ?[name] ?[name]. [name] [id number] ?[phone] ? ?     HOSPITAL FOLLOW UP NOTE ? ?[name] ?Date of Birth:  01-Oct-1970 ?Medical Record Number:  [id number]  ? ?Reason for Referral:  hospital stroke follow up ? ? ?SUBJECTIVE: ? ? ?CHIEF COMPLAINT:  ?Chief Complaint  ?Patient presents with  ? Follow-up  ?  Rm 1, w husband. Here to f/u from recent hospital visit. Pt reports being tired. Continues to have numbness in hands and R side of face. Has numbness and tingling inside of mouth and lips, as if shes been numbed for dental work. Sensitive and prickly feeling in R hand. C/o of always feeling cold.   ? ? ?HPI:  ? ?Morgan [name] is a 51 y.o. who  has a past medical history of Anemia, Constipation, Diabetes mellitus without complication ([place]), Diabetic retinopathy ([place]), H/O detached retina repair, Hypertension, Pneumonia, and Renal disorder.  Patient presented on 05/12/2021 with numbness of entire right side of body upon waking. CT negative for acute stoke but showed small remote lacunar infarct of posterior limb of right internal capsule. MR brain showed  small acute infarct left thalamus without hemorrhage or mass effect.  MRA head with multifocal severe stenosis or short segment occlusion predominantly within the posterior circulation, advanced atrophy and chronic small vessel ischemic disease.  MRA neck normal.  TTE with LVEF 99-35%, grade 2 diastolic dysfunction, no interatrial shunt noted.  She is not a candidate for thrombolytics as she was out of the time window and symptoms now resolved. No rehab needs with eval. She was started on dapt for three weeks then Plavix alone. Crestor 20mg  started. Personally reviewed hospitalization pertinent progress notes, lab work and imaging.  Evaluated by Dr Erlinda Hong.  ? ?Since discharge home, she reports continued numbness of the right side of her face and hand. She feels balance is not as good as it was prior.  She had to hold the bannister when walking up stairs. She reports that she previously wore high heel shoes and now unable to wear these due to feeling unbalanced. She noticed feeling off balance when trying to get out of the tub from sitting position last week. No falls since stroke. She reports two falls prior to her stoke, balance may have been off before.  ? ?She feels tired and stays cold. She has not seen PCP, recently. She sees Dr Altamease Oiler at [facility]. She sees [facility] every three months. She does not check BP at home. Uncertain when she takes amlodipine (am or pm). She is followed by endocrinology at [facility]. She is taking [place] 32 units daily and Novolog 12u before meals with SSI. Morning CBGs have been 150-200. She has not been as active since stroke.  ? ? ?PERTINENT IMAGING/LABS ? ?CT no acute abnormality, old right PLIC infarct ?MRI left thalamic small infarct ?MRA head and neck right P1 and P2, mid BA, distal left M1 and M2, bilateral ACAs moderate to severe stenosis ?2D Echo EF 55 to 60% ? ? ?A1C ?Lab Results  ?Component Value Date  ? HGBA1C 10.2 (H) 05/13/2021  ? ? ?Lipid Panel  ?   ?Component Value Date/Time  ? CHOL 124 05/13/2021 1238  ? TRIG 54 05/13/2021 1238  ? HDL 37 (L) 05/13/2021 1238  ? CHOLHDL 3.4 05/13/2021 1238  ? VLDL 11 05/13/2021 1238  ? [place] 76 05/13/2021 1238  ? ? ?ROS:   ?14 system review of systems performed and negative with  exception of those listed in HPI ? ?PMH:  ?Past Medical History:  ?Diagnosis Date  ? Anemia   ? low iron  ? Constipation   ? when given iron  ? Diabetes mellitus without complication ([place])   ? type 2  ? Diabetic retinopathy ([place])   ? H/O detached retina repair   ? bilateral  ? Hypertension   ? Pneumonia   ? Renal disorder   ? dialysis on T/Th/Sa  ? ? ?PSH:  ?Past Surgical History:  ?Procedure Laterality Date  ? ARTERIOVENOUS GRAFT PLACEMENT Left 07/2010  ? AV FISTULA PLACEMENT Right 11/25/2015  ? Procedure: INSERTION OF ARTERIOVENOUS (AV) GORE-TEX GRAFT RIGHT   ARM USING 4-7 MM X 45 CM STRETCH GRAFT.;  Surgeon: Angelia Mould, MD;  Location: [facility] OR;  Service: Vascular;  Laterality: Right;  ? basilic vein transposition Left 2011  ? CESAREAN SECTION    ? CHOLECYSTECTOMY N/A 10/08/2014  ? Procedure: LAPAROSCOPIC CHOLECYSTECTOMY ;  Surgeon: Erroll Luna, MD;  Location: [place];  Service: General;  Laterality: N/A;  ? EYE SURGERY    ? retinal detachment  ? REVISION OF ARTERIOVENOUS GORETEX GRAFT Left 05/13/2015  ? Procedure: REVISION OF ARTERIOVENOUS GORETEX GRAFT;  Surgeon: Angelia Mould, MD;  Location: [place];  Service: Vascular;  Laterality: Left;  ? ? ?Social History:  ?Social History  ? ?Socioeconomic History  ? Marital status: Married  ?  Spouse name: Not on file  ? Number of children: Not on file  ? Years of education: Not on file  ? Highest education level: Not on file  ?Occupational History  ? Not on file  ?Tobacco Use  ? Smoking status: Never  ? Smokeless tobacco: Never  ?Substance and Sexual Activity  ? Alcohol use: No  ?  Alcohol/week: 0.0 standard drinks  ? Drug use: No  ? Sexual activity: Not on file  ?Other Topics Concern  ? Not on file  ?Social History Narrative  ? L handed  ? ?Social Determinants of Health  ? ?Financial Resource Strain: Not on file  ?Food Insecurity: Not on file  ?Transportation Needs: Not on file  ?Physical Activity: Not on file  ?Stress: Not on file  ?Social Connections: Not on file  ?Intimate Partner Violence: Not on file  ? ? ?Family History:  ?Family History  ?Problem Relation Age of Onset  ? Diabetes Maternal Grandmother   ? ? ?Medications:   ?Current Outpatient Medications on File Prior to Visit  ?Medication Sig Dispense Refill  ? amLODipine (NORVASC) 5 MG tablet Take 5 mg by mouth at bedtime.  11  ? B Complex-C-Folic Acid (RENA-VITE PO) Take 1 tablet by mouth daily.    ? clopidogrel (PLAVIX) 75 MG tablet Take 1 tablet (75 mg total) by mouth daily. 30 tablet 2  ? Continuous Blood Gluc Receiver (DEXCOM G6 RECEIVER) DEVI APPLY  ROUTE ONCE    ? Continuous Blood Gluc Sensor (DEXCOM G6 SENSOR) MISC CHANGE SENSOR EVERY 10 DAYS 3 each 3  ? Continuous Blood Gluc Transmit (DEXCOM G6 TRANSMITTER) MISC USE TO CHECK BLOOD SUGAR AS DIRECTED 1 each 0  ? folic acid (FOLVITE) 1 MG tablet Take 1 mg by mouth every evening.  0  ? glucose blood (ONETOUCH VERIO) test strip 1 each by Other route 2 (two) times daily. And lancets 2/day 100 each 12  ? HYDROcodone-acetaminophen (NORCO) 5-325 MG tablet Take 1 tablet by mouth every 6 (six) hours as needed for moderate pain. 20 tablet 0  ? imiquimod (ALDARA)  5 % cream USE THREE TIMES WEEKLY AT BEDTIME. WASH OFF WITH SOAP AND WATER 6 TO 10 HOURS LATER. MAX 16 WEEKS. AVOID SEXUAL CONTACT WHILE CREAM IN ON SK  0  ? insulin aspart (NOVOLOG) 100 UNIT/ML injection Inject 12 Units into the skin 3 (three) times daily before meals. 12u before meals then SSI    ? Insulin Degludec (TRESIBA [place]) Inject 32 Units into the skin 2 (two) times daily.    ? Lancets (ONETOUCH DELICA PLUS [id number]) MISC USE TWICE A DAY TO CHECK BLOOD SUGAR    ? levonorgestrel (MIRENA) 20 MCG/24HR IUD 1 each by Intrauterine route once.    ? lidocaine-prilocaine (EMLA) cream Apply 1 application topically daily as needed (dialysis).     ? MM PEN NEEDLES 31G X 5 MM MISC USE  IN THE MORNING AND AT BEDTIME 100 each 0  ? mycophenolate (CELLCEPT) 250 MG capsule Take 1,000 mg by mouth 2 (two) times daily.    ? PARoxetine (PAXIL) 10 MG tablet Take 10 mg by mouth daily.    ? polyethylene glycol (MIRALAX / GLYCOLAX) packet Take 17 g by mouth daily.    ? predniSONE (DELTASONE) 5 MG tablet Take 5 mg by mouth daily with breakfast.    ? ranitidine (ZANTAC) 150 MG tablet Take 150 mg by mouth every evening.    ? rosuvastatin (CRESTOR) 20 MG tablet Take 1 tablet (20 mg total) by mouth daily. 90 tablet 0  ? sodium bicarbonate 650 MG tablet Take 1,300 mg by mouth 2 (two) times daily.    ? tacrolimus (PROGRAF) 1 MG capsule Take 4 mg by mouth 2 (two) times daily.    ?  valGANciclovir (VALCYTE) 450 MG tablet Take 450 mg by mouth daily.    ? ?No current facility-administered medications on file prior to visit.  ? ? ?Allergies:   ?Allergies  ?Allergen Reactions  ? Fluorescein S

## 2021-06-28 ENCOUNTER — Telehealth: Payer: Self-pay | Admitting: Family Medicine

## 2021-06-28 ENCOUNTER — Ambulatory Visit (INDEPENDENT_AMBULATORY_CARE_PROVIDER_SITE_OTHER): Payer: BC Managed Care – PPO | Admitting: Family Medicine

## 2021-06-28 ENCOUNTER — Encounter: Payer: Self-pay | Admitting: Family Medicine

## 2021-06-28 VITALS — BP 176/95 | HR 99 | Ht 62.0 in | Wt 196.5 lb

## 2021-06-28 DIAGNOSIS — E11649 Type 2 diabetes mellitus with hypoglycemia without coma: Secondary | ICD-10-CM | POA: Diagnosis not present

## 2021-06-28 DIAGNOSIS — Z940 Kidney transplant status: Secondary | ICD-10-CM

## 2021-06-28 DIAGNOSIS — I63332 Cerebral infarction due to thrombosis of left posterior cerebral artery: Secondary | ICD-10-CM | POA: Diagnosis not present

## 2021-06-28 DIAGNOSIS — R2689 Other abnormalities of gait and mobility: Secondary | ICD-10-CM | POA: Diagnosis not present

## 2021-06-28 DIAGNOSIS — E785 Hyperlipidemia, unspecified: Secondary | ICD-10-CM

## 2021-06-28 DIAGNOSIS — I10 Essential (primary) hypertension: Secondary | ICD-10-CM

## 2021-06-28 DIAGNOSIS — R202 Paresthesia of skin: Secondary | ICD-10-CM | POA: Diagnosis not present

## 2021-06-28 NOTE — Telephone Encounter (Signed)
Referral sent to [facility] [phone]. ?

## 2021-07-26 ENCOUNTER — Telehealth: Payer: Self-pay | Admitting: *Deleted

## 2021-07-26 NOTE — Telephone Encounter (Signed)
Pt securian form faxed on 07/26/21 to [phone]
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
## 2021-07-28 ENCOUNTER — Ambulatory Visit: Payer: BC Managed Care – PPO | Attending: Family Medicine

## 2021-07-28 DIAGNOSIS — R2681 Unsteadiness on feet: Secondary | ICD-10-CM | POA: Diagnosis present

## 2021-07-28 DIAGNOSIS — M6281 Muscle weakness (generalized): Secondary | ICD-10-CM | POA: Diagnosis present

## 2021-07-28 DIAGNOSIS — R262 Difficulty in walking, not elsewhere classified: Secondary | ICD-10-CM

## 2021-07-28 DIAGNOSIS — I63332 Cerebral infarction due to thrombosis of left posterior cerebral artery: Secondary | ICD-10-CM | POA: Insufficient documentation

## 2021-07-28 DIAGNOSIS — R2689 Other abnormalities of gait and mobility: Secondary | ICD-10-CM | POA: Diagnosis not present

## 2021-07-31 ENCOUNTER — Ambulatory Visit: Payer: BC Managed Care – PPO

## 2021-07-31 DIAGNOSIS — R2681 Unsteadiness on feet: Secondary | ICD-10-CM | POA: Diagnosis not present

## 2021-07-31 DIAGNOSIS — R262 Difficulty in walking, not elsewhere classified: Secondary | ICD-10-CM

## 2021-07-31 DIAGNOSIS — M6281 Muscle weakness (generalized): Secondary | ICD-10-CM
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
## 2021-08-02 ENCOUNTER — Encounter: Payer: Self-pay | Admitting: Physical Therapy

## 2021-08-02 ENCOUNTER — Ambulatory Visit: Payer: BC Managed Care – PPO | Admitting: Physical Therapy

## 2021-08-02 DIAGNOSIS — R2681 Unsteadiness on feet: Secondary | ICD-10-CM

## 2021-08-02 DIAGNOSIS — R262 Difficulty in walking, not elsewhere classified: Secondary | ICD-10-CM

## 2021-08-02 DIAGNOSIS — M6281 Muscle weakness (generalized): Secondary | ICD-10-CM

## 2021-08-08 ENCOUNTER — Ambulatory Visit: Payer: BC Managed Care – PPO | Admitting: Physical Therapy

## 2021-08-08 ENCOUNTER — Encounter: Payer: Self-pay | Admitting: Physical Therapy

## 2021-08-08 DIAGNOSIS — R2681 Unsteadiness on feet: Secondary | ICD-10-CM | POA: Diagnosis not present

## 2021-08-08 DIAGNOSIS — M6281 Muscle weakness (generalized): Secondary | ICD-10-CM

## 2021-08-08 DIAGNOSIS — R262 Difficulty in walking, not elsewhere classified: Secondary | ICD-10-CM
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
## 2021-08-09 ENCOUNTER — Ambulatory Visit: Payer: BC Managed Care – PPO

## 2021-08-09 DIAGNOSIS — R2681 Unsteadiness on feet: Secondary | ICD-10-CM | POA: Diagnosis not present

## 2021-08-09 DIAGNOSIS — M6281 Muscle weakness (generalized): Secondary | ICD-10-CM

## 2021-08-09 DIAGNOSIS — R262 Difficulty in walking, not elsewhere classified: Secondary | ICD-10-CM
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
## 2021-08-15 ENCOUNTER — Ambulatory Visit: Payer: BC Managed Care – PPO

## 2021-08-15 DIAGNOSIS — M6281 Muscle weakness (generalized): Secondary | ICD-10-CM

## 2021-08-15 DIAGNOSIS — R262 Difficulty in walking, not elsewhere classified: Secondary | ICD-10-CM

## 2021-08-15 DIAGNOSIS — R2681 Unsteadiness on feet: Secondary | ICD-10-CM
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
## 2021-08-17 ENCOUNTER — Ambulatory Visit: Payer: BC Managed Care – PPO

## 2021-08-24 DIAGNOSIS — Z955 Presence of coronary angioplasty implant and graft: Secondary | ICD-10-CM

## 2021-08-24 HISTORY — DX: Presence of coronary angioplasty implant and graft: Z95.5

## 2021-09-08 ENCOUNTER — Other Ambulatory Visit: Payer: Self-pay

## 2021-09-08 ENCOUNTER — Observation Stay (HOSPITAL_COMMUNITY)
Admission: EM | Admit: 2021-09-08 | Discharge: 2021-09-11 | Disposition: A | Discharge status: Home / Self Care | Payer: BC Managed Care – PPO | Attending: Internal Medicine | Admitting: Internal Medicine

## 2021-09-08 ENCOUNTER — Encounter (HOSPITAL_COMMUNITY): Payer: Self-pay

## 2021-09-08 ENCOUNTER — Emergency Department (HOSPITAL_COMMUNITY): Payer: BC Managed Care – PPO

## 2021-09-08 DIAGNOSIS — I639 Cerebral infarction, unspecified: Principal | ICD-10-CM | POA: Diagnosis present

## 2021-09-08 DIAGNOSIS — N179 Acute kidney failure, unspecified: Secondary | ICD-10-CM

## 2021-09-08 DIAGNOSIS — I2510 Atherosclerotic heart disease of native coronary artery without angina pectoris: Secondary | ICD-10-CM | POA: Insufficient documentation

## 2021-09-08 DIAGNOSIS — I10 Essential (primary) hypertension: Secondary | ICD-10-CM | POA: Diagnosis not present

## 2021-09-08 DIAGNOSIS — Z20822 Contact with and (suspected) exposure to covid-19: Secondary | ICD-10-CM | POA: Diagnosis not present

## 2021-09-08 DIAGNOSIS — E1165 Type 2 diabetes mellitus with hyperglycemia: Secondary | ICD-10-CM | POA: Diagnosis not present

## 2021-09-08 DIAGNOSIS — Z91040 Latex allergy status: Secondary | ICD-10-CM | POA: Diagnosis not present

## 2021-09-08 DIAGNOSIS — Z79899 Other long term (current) drug therapy: Secondary | ICD-10-CM | POA: Insufficient documentation

## 2021-09-08 DIAGNOSIS — I63332 Cerebral infarction due to thrombosis of left posterior cerebral artery: Secondary | ICD-10-CM

## 2021-09-08 DIAGNOSIS — Z940 Kidney transplant status: Secondary | ICD-10-CM | POA: Diagnosis not present

## 2021-09-08 DIAGNOSIS — Z794 Long term (current) use of insulin: Secondary | ICD-10-CM | POA: Insufficient documentation

## 2021-09-08 DIAGNOSIS — I739 Peripheral vascular disease, unspecified: Secondary | ICD-10-CM | POA: Diagnosis present

## 2021-09-08 DIAGNOSIS — D849 Immunodeficiency, unspecified: Secondary | ICD-10-CM | POA: Diagnosis present

## 2021-09-08 DIAGNOSIS — E119 Type 2 diabetes mellitus without complications: Secondary | ICD-10-CM

## 2021-09-08 DIAGNOSIS — N189 Chronic kidney disease, unspecified: Secondary | ICD-10-CM

## 2021-09-08 DIAGNOSIS — E785 Hyperlipidemia, unspecified: Secondary | ICD-10-CM | POA: Diagnosis present

## 2021-09-08 DIAGNOSIS — R739 Hyperglycemia, unspecified: Secondary | ICD-10-CM

## 2021-09-08 HISTORY — DX: Personal history of transient ischemic attack (TIA), and cerebral infarction without residual deficits: Z86.73

## 2021-09-08 HISTORY — DX: Peripheral vascular disease, unspecified: I73.9

## 2021-09-08 HISTORY — DX: Kidney transplant status: Z94.0

## 2021-09-08 LAB — CBG MONITORING, ED
Glucose-Capillary: 593 mg/dL (ref 70–99)
Glucose-Capillary: 283 mg/dL — ABNORMAL HIGH (ref 70–99)

## 2021-09-08 LAB — CBC
HCT: 48.6 % — ABNORMAL HIGH (ref 36.0–46.0)
Hemoglobin: 16.4 g/dL — ABNORMAL HIGH (ref 12.0–15.0)
MCH: 27.5 pg (ref 26.0–34.0)
MCHC: 33.7 g/dL (ref 30.0–36.0)
MCV: 81.4 fL (ref 80.0–100.0)
Platelets: 297 10*3/uL (ref 150–400)
RBC: 5.97 MIL/uL — ABNORMAL HIGH (ref 3.87–5.11)
RDW: 12.8 % (ref 11.5–15.5)
WBC: 7.3 10*3/uL (ref 4.0–10.5)
nRBC: 0 % (ref 0.0–0.2)

## 2021-09-08 LAB — DIFFERENTIAL
Abs Immature Granulocytes: 0.02 10*3/uL (ref 0.00–0.07)
Basophils Absolute: 0 10*3/uL (ref 0.0–0.1)
Basophils Relative: 0 %
Eosinophils Absolute: 0.1 10*3/uL (ref 0.0–0.5)
Eosinophils Relative: 1 %
Immature Granulocytes: 0 %
Lymphocytes Relative: 27 %
Lymphs Abs: 1.9 10*3/uL (ref 0.7–4.0)
Monocytes Absolute: 0.6 10*3/uL (ref 0.1–1.0)
Monocytes Relative: 8 %
Neutro Abs: 4.6 10*3/uL (ref 1.7–7.7)
Neutrophils Relative %: 64 %

## 2021-09-08 LAB — COMPREHENSIVE METABOLIC PANEL
ALT: 24 U/L (ref 0–44)
AST: 29 U/L (ref 15–41)
Albumin: 3.6 g/dL (ref 3.5–5.0)
Alkaline Phosphatase: 100 U/L (ref 38–126)
Anion gap: 16 — ABNORMAL HIGH (ref 5–15)
BUN: 38 mg/dL — ABNORMAL HIGH (ref 6–20)
CO2: 21 mmol/L — ABNORMAL LOW (ref 22–32)
Calcium: 10.5 mg/dL — ABNORMAL HIGH (ref 8.9–10.3)
Chloride: 95 mmol/L — ABNORMAL LOW (ref 98–111)
Creatinine, Ser: 2.05 mg/dL — ABNORMAL HIGH (ref 0.44–1.00)
GFR, Estimated: 29 mL/min — ABNORMAL LOW (ref >60)
Glucose, Bld: 527 mg/dL (ref 70–99)
Potassium: 5.2 mmol/L — ABNORMAL HIGH (ref 3.5–5.1)
Sodium: 132 mmol/L — ABNORMAL LOW (ref 135–145)
Total Bilirubin: 1 mg/dL (ref 0.3–1.2)
Total Protein: 6.8 g/dL (ref 6.5–8.1)

## 2021-09-08 LAB — I-STAT BETA HCG BLOOD, ED (MC, WL, AP ONLY): I-stat hCG, quantitative: <5 m[IU]/mL (ref <5)

## 2021-09-08 LAB — GLUCOSE, CAPILLARY: Glucose-Capillary: 149 mg/dL — ABNORMAL HIGH (ref 70–99)

## 2021-09-08 LAB — I-STAT CHEM 8, ED
BUN: 38 mg/dL — ABNORMAL HIGH (ref 6–20)
Calcium, Ion: 1.08 mmol/L — ABNORMAL LOW (ref 1.15–1.40)
Chloride: 99 mmol/L (ref 98–111)
Creatinine, Ser: 1.9 mg/dL — ABNORMAL HIGH (ref 0.44–1.00)
Glucose, Bld: 687 mg/dL (ref 70–99)
HCT: 50 % — ABNORMAL HIGH (ref 36.0–46.0)
Hemoglobin: 17 g/dL — ABNORMAL HIGH (ref 12.0–15.0)
Potassium: 4.7 mmol/L (ref 3.5–5.1)
Sodium: 128 mmol/L — ABNORMAL LOW (ref 135–145)
TCO2: 18 mmol/L — ABNORMAL LOW (ref 22–32)

## 2021-09-08 LAB — PROTIME-INR
INR: 1 (ref 0.8–1.2)
Prothrombin Time: 13.5 seconds (ref 11.4–15.2)

## 2021-09-08 LAB — RESP PANEL BY RT-PCR (FLU A&B, COVID) ARPGX2
Influenza A by PCR: NEGATIVE
Influenza B by PCR: NEGATIVE
SARS Coronavirus 2 by RT PCR: NEGATIVE

## 2021-09-08 LAB — APTT: aPTT: 25 seconds (ref 24–36)

## 2021-09-08 MED ORDER — MYCOPHENOLATE MOFETIL 250 MG PO CAPS
500.0000 mg | ORAL_CAPSULE | Freq: Every day | ORAL | Status: DC
  Administered 2021-09-09 – 2021-09-11 (×3): 500 mg via ORAL
  Filled 2021-09-08 (×4): qty 2

## 2021-09-08 MED ORDER — MYCOPHENOLATE MOFETIL 250 MG PO CAPS
250.0000 mg | ORAL_CAPSULE | Freq: Every day | ORAL | Status: DC
  Administered 2021-09-08 – 2021-09-10 (×3): 250 mg via ORAL
  Filled 2021-09-08 (×3): qty 1

## 2021-09-08 MED ORDER — INSULIN ASPART 100 UNIT/ML IJ SOLN
0.0000 [IU] | Freq: Three times a day (TID) | INTRAMUSCULAR | Status: DC
  Administered 2021-09-09: 2 [IU] via SUBCUTANEOUS
  Administered 2021-09-09: 5 [IU] via SUBCUTANEOUS

## 2021-09-08 MED ORDER — INSULIN ASPART 100 UNIT/ML IJ SOLN: 0.0000 [IU] | Freq: Every day | INTRAMUSCULAR | Status: DC

## 2021-09-08 MED ORDER — TACROLIMUS 1 MG PO CAPS
1.0000 mg | ORAL_CAPSULE | Freq: Two times a day (BID) | ORAL | Status: DC
  Administered 2021-09-08 – 2021-09-09 (×2): 1 mg via ORAL
  Filled 2021-09-08 (×2): qty 1

## 2021-09-08 MED ORDER — PAROXETINE HCL 10 MG PO TABS
10.0000 mg | ORAL_TABLET | Freq: Every day | ORAL | Status: DC
  Administered 2021-09-08 – 2021-09-11 (×4): 10 mg via ORAL
  Filled 2021-09-08 (×4): qty 1

## 2021-09-08 MED ORDER — SODIUM CHLORIDE 0.9 % IV BOLUS
1000.0000 mL | Freq: Once | INTRAVENOUS | Status: AC
  Administered 2021-09-08: 1000 mL via INTRAVENOUS

## 2021-09-08 MED ORDER — ACETAMINOPHEN 325 MG PO TABS
650.0000 mg | ORAL_TABLET | ORAL | Status: DC | PRN
  Filled 2021-09-08: qty 2

## 2021-09-08 MED ORDER — SODIUM CHLORIDE 0.9 % IV SOLN: INTRAVENOUS | Status: AC

## 2021-09-08 MED ORDER — SENNOSIDES-DOCUSATE SODIUM 8.6-50 MG PO TABS: 1.0000 | ORAL_TABLET | Freq: Every evening | ORAL | Status: DC | PRN

## 2021-09-08 MED ORDER — HYDROCODONE-ACETAMINOPHEN 5-325 MG PO TABS
1.0000 | ORAL_TABLET | Freq: Four times a day (QID) | ORAL | Status: DC | PRN
  Administered 2021-09-08: 1 via ORAL
  Filled 2021-09-08 (×2): qty 1

## 2021-09-08 MED ORDER — FOLIC ACID 1 MG PO TABS
1.0000 mg | ORAL_TABLET | Freq: Every evening | ORAL | Status: DC
  Administered 2021-09-08 – 2021-09-10 (×3): 1 mg via ORAL
  Filled 2021-09-08 (×3): qty 1

## 2021-09-08 MED ORDER — ACETAMINOPHEN 160 MG/5ML PO SOLN: 650.0000 mg | ORAL | Status: DC | PRN

## 2021-09-08 MED ORDER — STROKE: EARLY STAGES OF RECOVERY BOOK
Freq: Once | Status: AC
  Filled 2021-09-08: qty 1

## 2021-09-08 MED ORDER — INSULIN ASPART 100 UNIT/ML IJ SOLN
10.0000 [IU] | Freq: Once | INTRAMUSCULAR | Status: AC
  Administered 2021-09-08: 10 [IU] via SUBCUTANEOUS

## 2021-09-08 MED ORDER — ACETAMINOPHEN 650 MG RE SUPP: 650.0000 mg | RECTAL | Status: DC | PRN

## 2021-09-08 MED ORDER — INSULIN GLARGINE-YFGN 100 UNIT/ML ~~LOC~~ SOLN
20.0000 [IU] | Freq: Every day | SUBCUTANEOUS | Status: DC
  Administered 2021-09-08 – 2021-09-10 (×3): 20 [IU] via SUBCUTANEOUS
  Filled 2021-09-08 (×4): qty 0.2

## 2021-09-08 MED ORDER — PREDNISONE 5 MG PO TABS
5.0000 mg | ORAL_TABLET | Freq: Every day | ORAL | Status: DC
  Administered 2021-09-09 – 2021-09-11 (×3): 5 mg via ORAL
  Filled 2021-09-08 (×3): qty 1

## 2021-09-08 MED ORDER — POLYETHYLENE GLYCOL 3350 17 G PO PACK
17.0000 g | PACK | Freq: Every day | ORAL | Status: DC
  Filled 2021-09-08 (×3): qty 1

## 2021-09-08 MED ORDER — ENOXAPARIN SODIUM 40 MG/0.4ML IJ SOSY
40.0000 mg | PREFILLED_SYRINGE | INTRAMUSCULAR | Status: DC
  Administered 2021-09-08 – 2021-09-10 (×3): 40 mg via SUBCUTANEOUS
  Filled 2021-09-08 (×2): qty 0.4

## 2021-09-08 MED ORDER — SODIUM BICARBONATE 650 MG PO TABS
1300.0000 mg | ORAL_TABLET | Freq: Two times a day (BID) | ORAL | Status: DC
  Administered 2021-09-08 – 2021-09-11 (×6): 1300 mg via ORAL
  Filled 2021-09-08 (×7): qty 2

## 2021-09-08 NOTE — ED Provider Notes (Signed)
[facility] EMERGENCY DEPARTMENT Provider Note   CSN: [id number] Arrival date & time: 09/08/21  1331     History  Chief Complaint  Patient presents with   Hand Pain   Code Stroke    Morgan [name] is a 51 y.o. female.  Patient presents as Code Stroke, h/o ESRD status post renal transplant (04/2016, baseline creatinine 1.4-1.6) on immunosuppression, diabetes, retinal detachment bilateral, hypertension, left thalamic stroke with essentially no residual symptoms (March 2023) -- presents with complaint of acute onset of tingling in the right side of her body starting around noon today.  Patient also reports some difficulty with coordination in her left upper extremity.  Patient denies other signs of stroke including: facial droop, slurred speech, aphasia, imbalance/trouble walking.  She denies chest pain or shortness of breath.  She states that she ran out of all of her diabetes medications about 1 week ago due to insurance issues.  She was able to obtain these yesterday and started taking, last dose was this morning.  No other acute medical complaints.        Home Medications Prior to Admission medications   Medication Sig Start Date End Date Taking? Authorizing Provider  amLODipine (NORVASC) 5 MG tablet Take 5 mg by mouth at bedtime. 01/03/18   [provider]  B Complex-C-Folic Acid (RENA-VITE PO) Take 1 tablet by mouth daily.    [provider]  Continuous Blood Gluc Receiver (DEXCOM G6 RECEIVER) [place] 03/05/19   [provider]  Continuous Blood Gluc Sensor (DEXCOM G6 SENSOR) MISC CHANGE SENSOR EVERY 10 DAYS 05/21/20   Renato Shin, MD  Continuous Blood Gluc Transmit (DEXCOM G6 TRANSMITTER) MISC USE TO CHECK BLOOD SUGAR AS DIRECTED 03/17/20   Renato Shin, MD  folic acid (FOLVITE) 1 MG tablet Take 1 mg by mouth every evening. 01/03/18   [provider]  glucose blood (ONETOUCH VERIO) test strip 1 each by  Other route 2 (two) times daily. And lancets 2/day 03/04/19   Renato Shin, MD  HYDROcodone-acetaminophen [facility]) 5-325 MG tablet Take 1 tablet by mouth every 6 (six) hours as needed for moderate pain. 07/18/20   Hudnall, Sharyn Lull, MD  imiquimod (ALDARA) 5 % cream USE THREE TIMES WEEKLY AT BEDTIME. WASH OFF WITH SOAP AND WATER 6 TO 10 HOURS LATER. MAX 16 WEEKS. AVOID SEXUAL CONTACT WHILE CREAM IN ON SK 11/18/17   [provider]  insulin aspart (NOVOLOG) 100 UNIT/ML injection Inject 12 Units into the skin 3 (three) times daily before meals. 12u before meals then SSI    [provider]  Insulin Degludec (TRESIBA [place]) Inject 32 Units into the skin 2 (two) times daily.    [provider]  Lancets (ONETOUCH DELICA PLUS [id number]) [place] USE TWICE A DAY TO [place] BLOOD SUGAR 03/04/19   [provider]  levonorgestrel (MIRENA) 20 MCG/24HR IUD 1 each by Intrauterine route once.    [provider]  lidocaine-prilocaine (EMLA) cream Apply 1 application topically daily as needed (dialysis).     [provider]  MM PEN NEEDLES 31G X 5 MM MISC USE  IN THE MORNING AND AT BEDTIME 02/21/21   Renato Shin, MD  mycophenolate (CELLCEPT) 250 MG capsule Take 1,000 mg by mouth 2 (two) times daily.    [provider]  PARoxetine (PAXIL) 10 MG tablet Take 10 mg by mouth daily. 06/20/20   [provider]  polyethylene glycol (MIRALAX / GLYCOLAX) packet Take 17 g by mouth  daily.    [provider]  predniSONE (DELTASONE) 5 MG tablet Take 5 mg by mouth daily with breakfast.    [provider]  ranitidine (ZANTAC) 150 MG tablet Take 150 mg by mouth every evening.    [provider]  rosuvastatin (CRESTOR) 20 MG tablet Take 1 tablet (20 mg total) by mouth daily. 05/13/21 08/11/21  [place] Donnamarie Poag, DO  sodium bicarbonate 650 MG tablet Take 1,300 mg by mouth 2 (two) times daily. 07/04/20   [provider]  tacrolimus (PROGRAF) 1 MG  capsule Take 4 mg by mouth 2 (two) times daily.    [provider]  valGANciclovir (VALCYTE) 450 MG tablet Take 450 mg by mouth daily.    [provider]      Allergies    Fluorescein, Iodinated contrast media, Iohexol, Latex, Egg white (egg protein), Nsaids, and Shellfish allergy    Review of Systems   Review of Systems  Physical Exam Updated Vital Signs BP (!) 130/41 (BP Location: Right Leg)   Pulse 88   Temp 98.1 F (36.7 C) (Oral)   Resp 12   Ht 5\' 2"  (1.575 m)   Wt 87.4 kg   SpO2 98%   BMI 35.24 kg/m   Physical Exam Vitals and nursing note reviewed.  Constitutional:      General: She is not in acute distress.    Appearance: She is well-developed.  HENT:     Head: Normocephalic and atraumatic.     Right Ear: External ear normal.     Left Ear: External ear normal.     Nose: Nose normal.  Eyes:     Conjunctiva/sclera: Conjunctivae normal.  Cardiovascular:     Rate and Rhythm: Normal rate and regular rhythm.     Heart sounds: No murmur heard. Pulmonary:     Effort: No respiratory distress.     Breath sounds: No wheezing, rhonchi or rales.  Abdominal:     Palpations: Abdomen is soft.     Tenderness: There is no abdominal tenderness. There is no guarding or rebound.  Musculoskeletal:     Cervical back: Normal range of motion and neck supple.     Right lower leg: No edema.     Left lower leg: No edema.  Skin:    General: Skin is warm and dry.     Findings: No rash.  Neurological:     Mental Status: She is alert.     Cranial Nerves: No cranial nerve deficit.     Sensory: Sensory deficit present.     Motor: No weakness.     Coordination: Coordination normal.  Psychiatric:        Mood and Affect: Mood normal.     ED Results / Procedures / Treatments   Labs (all labs ordered are listed, but only abnormal results are displayed) Labs Reviewed  CBC - Abnormal; Notable for the following components:      Result Value   RBC 5.97 (*)     Hemoglobin 16.4 (*)    HCT 48.6 (*)    All other components within normal limits  I-STAT CHEM 8, ED - Abnormal; Notable for the following components:   Sodium 128 (*)    BUN 38 (*)    Creatinine, Ser 1.90 (*)    Glucose, Bld 687 (*)    Calcium, Ion 1.08 (*)    TCO2 18 (*)    Hemoglobin 17.0 (*)    HCT 50.0 (*)    All other  components within normal limits  CBG MONITORING, ED - Abnormal; Notable for the following components:   Glucose-Capillary 593 (*)    All other components within normal limits  RESP PANEL BY RT-PCR (FLU A&B, COVID) ARPGX2  PROTIME-INR  APTT  DIFFERENTIAL  ETHANOL  RAPID URINE DRUG SCREEN, HOSP PERFORMED  URINALYSIS, ROUTINE W REFLEX MICROSCOPIC  COMPREHENSIVE METABOLIC PANEL  I-STAT BETA HCG BLOOD, ED ([facility], [facility], [facility] ONLY)    EKG EKG Interpretation  Date/Time:  Friday September 08 2021 13:42:10 EDT Ventricular Rate:  109 PR Interval:  132 QRS Duration: 78 QT Interval:  376 QTC Calculation: 506 R Axis:   -25 Text Interpretation: Sinus tachycardia Right atrial enlargement Left ventricular hypertrophy with repolarization abnormality ( R in aVL , Cornell product , Romhilt-Estes ) Abnormal ECG Confirmed by Carmin Muskrat [id number]) on 09/08/2021 2:46:06 PM  Radiology CT HEAD CODE STROKE WO CONTRAST  Result Date: 09/08/2021 CLINICAL DATA:  Code stroke.  Acute right-sided tingling. EXAM: CT HEAD WITHOUT CONTRAST TECHNIQUE: Contiguous axial images were obtained from the base of the skull through the vertex without intravenous contrast. RADIATION DOSE REDUCTION: This exam was performed according to the departmental dose-optimization program which includes automated exposure control, adjustment of the mA and/or kV according to patient size and/or use of iterative reconstruction technique. COMPARISON:  CT head 05/12/2021. MR head 05/12/2021 FINDINGS: Brain: Remote lacunar infarcts are again noted within the right external capsule and thalamus. Mild diffuse periventricular white  matter hypoattenuation is advanced for age. No acute infarct, hemorrhage, or mass lesion is present. The ventricles are of normal size. No significant extraaxial fluid collection is present. Vascular: Atherosclerotic calcifications are present within the cavernous internal carotid arteries bilaterally. No hyperdense vessel is present. Skull: Calvarium is intact. No focal lytic or blastic lesions are present. No significant extracranial soft tissue lesion is present. Sinuses/Orbits: Chronic right sphenoid sinus disease again noted. The paranasal sinuses and mastoid air cells are otherwise clear. Bilateral lens replacements are noted. Globes and orbits are otherwise unremarkable. ASPECTS [facility] Stroke Program Early CT Score) - Ganglionic level infarction (caudate, lentiform nuclei, internal capsule, insula, M1-M3 cortex): 7/7 - Supraganglionic infarction (M4-M6 cortex): 3/3 Total score (0-10 with 10 being normal): 10/10 IMPRESSION: 1. No acute intracranial abnormality or significant interval change. 2. Stable remote lacunar infarcts of the right external capsule and thalamus. 3. Stable atrophy and white matter disease. This likely reflects the sequela of chronic microvascular ischemia. 4. ASPECTS is 10/10. The above was relayed via text pager to Dr. Rory Percy on 09/08/2021 at 14:56 . Electronically Signed   By: San Morelle M.D.   On: 09/08/2021 14:56    Procedures Procedures    Medications Ordered in ED Medications - No data to display  ED Course/ Medical Decision Making/ A&P    3:22 PM Pt in CT/MRI, has been seen by neurology and awaiting final reccs. At shift change, report obtained from Dr. Vanita Panda.   Patient seen and examined. History obtained directly from patient. Work-up including labs, imaging, EKG ordered in triage, if performed, were reviewed.    Labs/EKG: Independently reviewed and interpreted.  This included: CBC with hemoglobin 16.4, normal white blood cell count; i-STAT Chem-8 with  sodium 128 correcting to normal due to glucose of 687, normal anion gap, creatinine 1.9, hemoglobin 17; negative pregnancy; INR normal at 1.0; APTT normal.  EKG faster today compared to March 2023, stable repolarization abnormality.  Imaging: Independently visualized and interpreted.  This included: CT head, agree negative for acute findings.  MRI pending.    Medications/Fluids: Ordered: IV fluid bolus.  Most recent vital signs reviewed and are as follows: BP (!) 130/46 (BP Location: Left Leg)   Pulse 97   Temp 98.1 F (36.7 C) (Oral)   Resp 19   Ht 5\' 2"  (1.575 m)   Wt 87.4 kg   SpO2 98%   BMI 35.24 kg/m   Initial impression: Acute stroke, hyperglycemia, mild AKI in setting of renal transplant  Discussed plan with [facility] stroke RN. Will need admission for above problems.  Awaiting final neurology recommendations.  3:53 PM Consulted with Dr. Rory Percy. Plan: hospital admission for full CVA evaluation, treatment of hyperglycemia. Symptoms are mild and decision made not to treat with thrombolytics unless there is decompensation in exam.   4:42 PM Consulted with Dr. Daryll Drown with [facility] by telephone. She will see patient for admission.  CRITICAL CARE Performed by: Carlisle Cater  PA-C Total critical care time: 35 minutes Critical care time was exclusive of separately billable procedures and treating other patients. Critical care was necessary to treat or prevent imminent or life-threatening deterioration. Critical care was time spent personally by me on the following activities: development of treatment plan with patient and/or surrogate as well as nursing, discussions with consultants, evaluation of patient's response to treatment, examination of patient, obtaining history from patient or surrogate, ordering and performing treatments and interventions, ordering and review of laboratory studies, ordering and review of radiographic studies, pulse oximetry and re-evaluation of  patient's condition.                           Medical Decision Making  Admit.         Final Clinical Impression(s) / ED Diagnoses Final diagnoses:  Acute CVA (cerebrovascular accident) ([place])  Hyperglycemia without ketosis  Acute kidney injury superimposed on CKD [facility])  Deceased-donor kidney transplant    Rx / DC Orders ED Discharge Orders     None         Carlisle Cater, PA-C 09/08/21 1643    Lajean Saver, MD 09/08/21 1738

## 2021-09-08 NOTE — ED Provider Triage Note (Signed)
Emergency Medicine Provider Triage Evaluation Note  Morgan [name] , a 50 y.o. female  was evaluated in triage.  Pt complains of left-sided weakness. Last known well of 1245 according to her husband at bedside. Patient recently had a cardiac stent/CABG.  She reports that she feels like her left side is heavy.  Her husband states that they were interacting normally and he did not have any concerns until about 1245 when he noticed her speech was abnormal and she seemed weak on her left side.  She has a history of a stroke which reportedly gave her right-sided deficits.     Physical Exam  BP (!) 121/55 (BP Location: Left Leg)   Pulse (!) 105   Temp 98.1 F (36.7 C) (Oral)   Resp 20   Ht _0  (1.575 m)   Wt 86.6 kg   SpO2 98%   BMI 34.93 kg/m  Gen:   Awake, slow to respond.  She is oriented to person, place, and time.  She is slow to answer the questions. Resp:  Normal effort  MSK:   Patient has left-sided pronator drift.  Left grip strength is 4/5, right is -5/5.  Left leg strength 4/5.  Other:  No obvious facial droop.   Medical Decision Making  Medically screening exam initiated at 2:31 PM.  Appropriate orders placed.  B[place] was informed that the remainder of the evaluation will be completed by another provider, this initial triage assessment does not replace that evaluation, and the importance of remaining in the ED until their evaluation is complete.  Patient is slow to respond.  Within 5 minutes of my evaluation being initiated I decided the patient met criteria for code stroke. Code stroke was activated.  I immediately informed triage RN and tech of this.   Called orange secretary to ask her to page this out. 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  She did recently have a heart cath however states that she has an IV started in her hand during that.  Given the emergent, possibly life threatening situation I gave phlebotomist verbal permission to  draw blood out of the left hand.    I called on-call neurologist  at 1429 and discussed patient's last known well and symptoms.      Lorin Glass, [place] 09/08/21 1438

## 2021-09-08 NOTE — ED Triage Notes (Addendum)
Pt c/o hand pain that has progressed up left arm and left arm feels heavy.  This started approximately 1 hr ago. Left hand/arm is worse than right.  Pt has numbness in right arm from previous stroke on March 17th, 2023.  Pt has had indigestion as well.  Pt had stents placed 2 weeks ago (June 29th) at [facility], pt has been on bed rest since then. Pt slow to answer questions, husband states she usually only sounds like this when she is really tired.

## 2021-09-08 NOTE — Code Documentation (Signed)
Stroke Response Nurse Documentation Code Documentation  Morgan [name] is a 51 y.o. female arriving to [facility]  via [facility] on 09/08/2021 with past medical hx of kidney transplant and stroke. Code stroke was activated by ED.   Patient from restaurant where she was LKW at 1200 and now complaining of right sided hypersensitivity. Pt was sitting at the restaurant and had symptoms come on all the sudden. Pt reports she has been out of her insulin and insurance would not fill it for the last couple days. Today, she finally got insulin, but did not have any more strips so she dose her self with insulin without knowing CBG.   Stroke team at the bedside on patient arrival. Labs drawn and patient cleared for CT by Dr. Vanita Panda. Patient to CT with team. NIHSS 2, see documentation for details and code stroke times. Patient with right sided sensory changes and bilateral upper proximal baseline vision loss from detached retina on exam. The following imaging was completed:  CT Head and MRI. Patient is not a candidate for IV Thrombolytic due to being too mild to treat. Patient is not a candidate for IR due to no LVO noted on imaging per provider.   Care Plan: q30 NIHSS/VS until 1630 when patient is outside TNK window.   Bedside handoff with ED RN Benjamine Mola .    Kathrin Greathouse  Stroke Response RN

## 2021-09-08 NOTE — H&P (Signed)
History and Physical    Morgan [name]:[id number] DOB: 1970/06/11 DOA: 09/08/2021  PCP: Awanda Mink, MD  Patient coming from: Home Chief Complaint: Coordination issues on the left  HPI: Morgan [name] is a 51 y.o. female with medical history significant of CKD s/p transplant on immunosuppression, DM2, CVA, HTN, HLD, PVD who presents with complaints of decreased left arm coordination.  She notes that she is left handed and she noticed that while she was eating brunch this morning, around noon, she developed some issues with coordination of the left hand.  She also has tingling on the right side of her body and right face which is persistent since her last stroke in March of this year.  Further symptoms include odd visual changes which come and go, chest pain and nausea this morning related to eating breakfast quickly, polyuria and polydipsia over the last few weeks.  She notes being out of her DM medications for at least 2 weeks, and also being out of plavix for 2 weeks due to insurance issues.  She was out of her immunosuppressives for only 3 days, as she went directly to her transplant team to get refills.  She further has a recent cardiac stent and stent in an artery in her leg done at [facility].  She was due to go to cardiac rehab as well.   ED Course: In the ED, Code Stroke was called.  CT head did not show anything new, but MRI did show 2 punctate acute infarcts in the right external capsule and right periventricular frontal lobe.  She was also noted to be hyperglycemic with pseudohyponatremia (corrected to 140) mildly hyperkalemic (5.2), Bicarb of 21 and renal function worsened to 2.05 with BUN of 38, likely dehydration related.  AG was 16 prior to insulin being given at 4pm, will recheck BMET.  Evening insulin was ordered as well.  Neurology is following.    Review of Systems: As per HPI otherwise all other systems reviewed and are negative.   Past Medical  History:  Diagnosis Date   Anemia    low iron   Constipation    when given iron   Diabetes mellitus without complication ([place])    type 2   Diabetic retinopathy ([place])    H/O detached retina repair    bilateral   History of CVA (cerebrovascular accident)    Hypertension    Kidney transplanted    Pneumonia    PVD (peripheral vascular disease) ([place])     Past Surgical History:  Procedure Laterality Date   ARTERIOVENOUS GRAFT PLACEMENT Left 07/28/2010   AV FISTULA PLACEMENT Right 11/25/2015   Procedure: INSERTION OF ARTERIOVENOUS (AV) GORE-TEX GRAFT RIGHT  ARM USING 4-7 MM X 45 CM STRETCH GRAFT.;  Surgeon: Angelia Mould, MD;  Location: [facility];  Service: Vascular;  Laterality: Right;   basilic vein transposition Left 02/26/2009   CESAREAN SECTION     CHOLECYSTECTOMY N/A 10/08/2014   Procedure: LAPAROSCOPIC CHOLECYSTECTOMY ;  Surgeon: Erroll Luna, MD;  Location: [facility];  Service: General;  Laterality: N/A;   EYE SURGERY     retinal detachment   KIDNEY TRANSPLANT  04/28/2016   REVISION OF ARTERIOVENOUS GORETEX GRAFT Left 05/13/2015   Procedure: REVISION OF ARTERIOVENOUS GORETEX GRAFT;  Surgeon: Angelia Mould, MD;  Location: [facility];  Service: Vascular;  Laterality: Left;    Social History  reports that she has never smoked. She has never used smokeless tobacco. She reports that she does not  drink alcohol and does not use drugs.  Allergies  Allergen Reactions   Fluorescein Shortness Of Breath and Other (See Comments)    Hypertension   Iodinated Contrast Media Anaphylaxis, Hives and Other (See Comments)    Diffuse swelling   Iohexol Anaphylaxis, Hives, Itching, Swelling and Other (See Comments)    13 HR PRE-MEDS REQUIRED   Latex Itching   Egg White (Egg Protein)     Other reaction(s): Other (See Comments) CANNOT TAKE DUE TO AUTO IMMUNE DISORDER  CANNOT TAKE DUE TO AUTO IMMUNE DISORDER  CANNOT TAKE DUE TO AUTO IMMUNE DISORDER    Nsaids Other (See Comments)     CANNOT TAKE DUE TO KIDNEY  CANNOT TAKE DUE TO KIDNEY  CANNOT TAKE DUE TO KIDNEY    Shellfish Allergy     Other reaction(s): Other (See Comments) CANNOT TAKE DUE TO KIDNEY TRANSPLANT  CANNOT TAKE DUE TO KIDNEY TRANSPLANT  CANNOT TAKE DUE TO KIDNEY TRANSPLANT     Family History  Problem Relation Age of Onset   Diabetes Maternal Grandmother    Gout Maternal Uncle     Medications:  Amlodipine 5mg  daily Vitamin D  Plavix 75mg  daily Folic acid 1mg  daily Tresiba 36 units QHS Insulin 16 units with meals (sliding scale) Cellcept 500mg  in the AM, 250mg  in the PM Paroxetine Prednisone 5mg  daily Crestor 20mg  daily Senna PRN Bicarb 1300mg  TID Prograf 1mg  BID   Physical Exam: Vitals:   09/08/21 1500 09/08/21 1530 09/08/21 1545 09/08/21 1645  BP: (!) 130/41 (!) 130/46 (!) 98/45 (!) 117/54  Pulse: 88 97 95 90  Resp: 12 19 19 17   Temp:      TempSrc:      SpO2:   98% 98%  Weight:      Height:        Constitutional: NAD, calm, comfortable Eyes: lids and conjunctivae normal, EOMI, mildly disconjugate gaze which is chronic ENMT: Mucous membranes are dry Neck: normal, supple Respiratory: CTAB, no wheezing, no rales or crackles Cardiovascular: Regular rate and rhythm, no murmur noted, no pedal edema Abdomen: NT, ND, +BS.  She has no pain over site of transplanted kidney Musculoskeletal: no clubbing / cyanosis. Normal tone and bulk for age Skin: no rashes, lesions, ulcers on exposed skin.  Well healed surgical scar in the RLQ of the abdomen Neurologic: Cranial nerves are grossly intact, EOMI, tongue midline.  She has mild sensation difference on the right face and right hemibody which feels like the skin is overly sensitive.  She has very mildly decreased coordination on Finger to nose on the left and heal to shin on the left.  She has intact strength, with possibly 4+ out of 5 in the small muscles of the hand on the left. She has no dysarthria or word finding difficulty.  Psychiatric:  Normal judgment and insight. Alert and oriented x 3. Normal mood.     Labs on Admission: I have personally reviewed following labs and imaging studies  CBC: Recent Labs  Lab 09/08/21 1426 09/08/21 1436  WBC 7.3  --   NEUTROABS 4.6  --   HGB 16.4* 17.0*  HCT 48.6* 50.0*  MCV 81.4  --   PLT 297  --     Basic Metabolic Panel: Recent Labs  Lab 09/08/21 1436 09/08/21 1543  NA 128* 132*  K 4.7 5.2*  CL 99 95*  CO2  --  21*  GLUCOSE 687* 527*  BUN 38* 38*  CREATININE 1.90* 2.05*  CALCIUM  --  10.5*  GFR: Estimated Creatinine Clearance: 33.3 mL/min (A) (by C-G formula based on SCr of 2.05 mg/dL (H)).  Liver Function Tests: Recent Labs  Lab 09/08/21 1543  AST 29  ALT 24  ALKPHOS 100  BILITOT 1.0  PROT 6.8  ALBUMIN 3.6    Urine analysis: Pending  Radiological Exams on Admission: MR BRAIN WO CONTRAST  Result Date: 09/08/2021 CLINICAL DATA:  Neuro deficit, acute, stroke suspected EXAM: MRI HEAD WITHOUT CONTRAST TECHNIQUE: Multiplanar, multiecho pulse sequences of the brain and surrounding structures were obtained without intravenous contrast. COMPARISON:  Same day CT.  MRI May 12, 2021. FINDINGS: Brain: Possible punctate acute infarct in the right external capsule (for example series 2 and 250, image 22) and right periventricular frontal lobe (series 2 and 250, image 31). No acute hemorrhage, hydrocephalus, extra-axial collection or mass lesion. Small remote infarcts in the thalamus bilaterally. Cerebral atrophy. Scattered T2/FLAIR hyperintensities the white matter, nonspecific but compatible with chronic microvascular ischemic disease. Vascular: Major arterial flow voids are maintained at the skull base. Skull and upper cervical spine: Normal marrow signal. Sinuses/Orbits: Clear sinuses.  No acute orbital findings. Other: No mastoid effusions. IMPRESSION: 1. Possible punctate acute infarcts in the right external capsule and right periventricular frontal lobe. 2. Small  remote thalamic lacunar infarcts. Electronically Signed   By: Margaretha Sheffield M.D.   On: 09/08/2021 15:46   CT HEAD CODE STROKE WO CONTRAST  Result Date: 09/08/2021 CLINICAL DATA:  Code stroke.  Acute right-sided tingling. EXAM: CT HEAD WITHOUT CONTRAST TECHNIQUE: Contiguous axial images were obtained from the base of the skull through the vertex without intravenous contrast. RADIATION DOSE REDUCTION: This exam was performed according to the departmental dose-optimization program which includes automated exposure control, adjustment of the mA and/or kV according to patient size and/or use of iterative reconstruction technique. COMPARISON:  CT head 05/12/2021. MR head 05/12/2021 FINDINGS: Brain: Remote lacunar infarcts are again noted within the right external capsule and thalamus. Mild diffuse periventricular white matter hypoattenuation is advanced for age. No acute infarct, hemorrhage, or mass lesion is present. The ventricles are of normal size. No significant extraaxial fluid collection is present. Vascular: Atherosclerotic calcifications are present within the cavernous internal carotid arteries bilaterally. No hyperdense vessel is present. Skull: Calvarium is intact. No focal lytic or blastic lesions are present. No significant extracranial soft tissue lesion is present. Sinuses/Orbits: Chronic right sphenoid sinus disease again noted. The paranasal sinuses and mastoid air cells are otherwise clear. Bilateral lens replacements are noted. Globes and orbits are otherwise unremarkable. ASPECTS [facility] Stroke Program Early CT Score) - Ganglionic level infarction (caudate, lentiform nuclei, internal capsule, insula, M1-M3 cortex): 7/7 - Supraganglionic infarction (M4-M6 cortex): 3/3 Total score (0-10 with 10 being normal): 10/10 IMPRESSION: 1. No acute intracranial abnormality or significant interval change. 2. Stable remote lacunar infarcts of the right external capsule and thalamus. 3. Stable atrophy and  white matter disease. This likely reflects the sequela of chronic microvascular ischemia. 4. ASPECTS is 10/10. The above was relayed via text pager to Dr. Rory Percy on 09/08/2021 at 14:56 . Electronically Signed   By: San Morelle M.D.   On: 09/08/2021 14:56    EKG: Independently reviewed. Sinus tachycardia, TWI in aVL, appear in previous EKG.    Assessment/Plan  Acute CVA (cerebrovascular accident) - Initiated work up including MRA of the brain, neck - TTE - Telemetry - Plavix continued - she was out of this medication for ~ 2 weeks - Permissive HTN, held home amlodipine - PT/OT/SLP - If  bedside swallow is okay, allow diet - Check lipid panel, A1C - She is on crestor, will discuss with neurology if dose should be increased - Neurochecks - Carotid dopplers  Diabetes mellitus, type 2 (HCC) Hyperglycemia, AG of 16 Pseudo hyponatremia related to elevated glucose - Repeat BMET pending after insulin given to patient - Out of insulin and ozempic for 2 weeks - Restart long acting insulin 20 units qhs, hold if remains NPO - SSI  - Check A1C - Repeat BMET for AG, if still elevated, consider starting on insulin drip    Essential (primary) hypertension - Currently normotensive - Hold home amlodipine   Immunosuppression ([place]) S/p cadaver renal transplant CKD stage IIIa Mild Hyperkalemia AKI - I think this is likely pre-renal changes related to polyuria and polydipsia from diabetes being uncontrolled - IVF at 150cc/hr for 12 hours - Re check BMET in the AM - 1 dose of lokelma given  - Baseline per notes is "low 1's, possibly around 1.6, currently Cr is 2.02    Peripheral vascular disease, unspecified ([place]) CAD - stenting 08/24/21 - Out of plavix for 2 weeks - Not currently complaining of chest pain or other signs of in stent thrombosis - Monitor on telemetry  - Repeat EKG and TnI for any changes - Continue statin    Hyperlipidemia - Continue crestor    DVT  prophylaxis: Lovenox  Code Status:   Full   Disposition Plan:   Patient is from:  Home  Anticipated DC to:  Home vs. Rehab  Anticipated DC date:  09/11/21  Anticipated DC barriers: Function  Consults called:  Neurology, Rory Percy  Admission status:  IP, medical telemetry  Severity of Illness: The appropriate patient status for this patient is INPATIENT. Inpatient status is judged to be reasonable and necessary in order to provide the required intensity of service to ensure the patient's safety. The patient's presenting symptoms, physical exam findings, and initial radiographic and laboratory data in the context of their chronic comorbidities is felt to place them at high risk for further clinical deterioration. Furthermore, it is not anticipated that the patient will be medically stable for discharge from the hospital within 2 midnights of admission.   * I certify that at the point of admission it is my clinical judgment that the patient will require inpatient hospital care spanning beyond 2 midnights from the point of admission due to high intensity of service, high risk for further deterioration and high frequency of surveillance required.Gilles Chiquito MD [facility]  How to contact the [facility] Attending or Consulting provider [place] or covering provider during after hours [place], for this patient?   Check the care team in [facility] and look for a) attending/consulting [facility] provider listed and b) the [facility] team listed Log into [email or web address] and use [facility] universal password to access. If you do not have the password, please contact the hospital operator. Locate the [facility] provider you are looking for under [facility] and page to a number that you can be directly reached. If you still have difficulty reaching the provider, please page the [facility] (Director on Call) for the Hospitalists listed on amion for assistance.  09/08/2021, 6:05 PM

## 2021-09-08 NOTE — Plan of Care (Signed)
  Problem: Safety: Goal: Ability to remain free from injury will improve Outcome: Not Progressing   Problem: Pain Managment: Goal: General experience of comfort will improve Outcome: Not Progressing   Problem: Coping: Goal: Level of anxiety will decrease Outcome: Not Progressing   Problem: Nutrition: Goal: Adequate nutrition will be maintained Outcome: Not Progressing   Problem: Clinical Measurements: Goal: Cardiovascular complication will be avoided Outcome: Not Progressing

## 2021-09-08 NOTE — ED Notes (Signed)
Pt transported to MRI 

## 2021-09-08 NOTE — Consult Note (Signed)
Neurology Consultation  Reason for Consult: code stroke Referring Physician: Dr. Adrian Prows  CC: Right-sided tingling and numbness  History is obtained from: Patient, chart, her husband  HPI: Morgan [name] is a 51 y.o. female past medical of ESRD status post renal transplant on immunosuppression, diabetes, retinal detachment bilateral, hypertension, left thalamic stroke with essentially no residual symptoms (March 2023) presented to the emergency room for evaluation of sudden onset of tingling and numbness on the right hemibody with a last known well of around noon.  Her husband also provided history and said they went for lunch around 12 PM when she was perfectly normal and then started noticing some tingling and numbness on the right side of her body. Also had paresthesias on left which is new. She reports a sensation being oversensitive on that side.  He drove her to the ER and she was seen and evaluated by the triage PA who activated a code stroke. I saw her in the CT scanner.  NIH stroke scale-2. Taken for stat MRI because of concern for new stroke versus symptoms being recrudescence of old symptoms on the right and left could be new stroke vs hyperglycemia being stroke mimic. In the ED, fingerstick glucose was greater than 600. Blood pressure was in the normal range  Of note, she had had some trouble getting her diabetes medications due to insurance issues and just recently this morning started using insulin without checking her blood sugars.  LKW: 12 PM IV thrombolysis given?: no, too mild to treat, likely stroke mimic in the setting of hyperglycemia Premorbid modified Rankin scale (mRS):1-due to right Attachment related deficits otherwise Works full-time as a Cabin crew   ROS: Full ROS was performed and is negative except as noted in the HPI.   Past Medical History:  Diagnosis Date   Anemia    low iron   Constipation    when given iron   Diabetes mellitus without  complication (HCC)    type 2   Diabetic retinopathy (HCC)    H/O detached retina repair    bilateral   Hypertension    Pneumonia    Renal disorder    dialysis on T/Th/Sa   Family History  Problem Relation Age of Onset   Diabetes Maternal Grandmother    Social History:   reports that she has never smoked. She has never used smokeless tobacco. She reports that she does not drink alcohol and does not use drugs.  Medications No current facility-administered medications for this encounter.  Current Outpatient Medications:    amLODipine (NORVASC) 5 MG tablet, Take 5 mg by mouth at bedtime., Disp: , Rfl: 11   B Complex-C-Folic Acid (RENA-VITE PO), Take 1 tablet by mouth daily., Disp: , Rfl:    Continuous Blood Gluc Receiver (DEXCOM G6 RECEIVER) DEVI, APPLY ROUTE ONCE, Disp: , Rfl:    Continuous Blood Gluc Sensor (DEXCOM G6 SENSOR) MISC, CHANGE SENSOR EVERY 10 DAYS, Disp: 3 each, Rfl: 3   Continuous Blood Gluc Transmit (DEXCOM G6 TRANSMITTER) MISC, USE TO CHECK BLOOD SUGAR AS DIRECTED, Disp: 1 each, Rfl: 0   folic acid (FOLVITE) 1 MG tablet, Take 1 mg by mouth every evening., Disp: , Rfl: 0   glucose blood (ONETOUCH VERIO) test strip, 1 each by Other route 2 (two) times daily. And lancets 2/day, Disp: 100 each, Rfl: 12   HYDROcodone-acetaminophen (NORCO) 5-325 MG tablet, Take 1 tablet by mouth every 6 (six) hours as needed for moderate pain., Disp: 20 tablet, Rfl: 0  imiquimod (ALDARA) 5 % cream, USE THREE TIMES WEEKLY AT BEDTIME. WASH OFF WITH SOAP AND WATER 6 TO 10 HOURS LATER. MAX 16 WEEKS. AVOID SEXUAL CONTACT WHILE CREAM IN ON SK, Disp: , Rfl: 0   insulin aspart (NOVOLOG) 100 UNIT/ML injection, Inject 12 Units into the skin 3 (three) times daily before meals. 12u before meals then SSI, Disp: , Rfl:    Insulin Degludec (TRESIBA [place]), Inject 32 Units into the skin 2 (two) times daily., Disp: , Rfl:    Lancets (ONETOUCH DELICA PLUS CVELFY10F) MISC, USE TWICE A DAY TO CHECK BLOOD SUGAR,  Disp: , Rfl:    levonorgestrel (MIRENA) 20 MCG/24HR IUD, 1 each by Intrauterine route once., Disp: , Rfl:    lidocaine-prilocaine (EMLA) cream, Apply 1 application topically daily as needed (dialysis). , Disp: , Rfl:    MM PEN NEEDLES 31G X 5 MM MISC, USE  IN THE MORNING AND AT BEDTIME, Disp: 100 each, Rfl: 0   mycophenolate (CELLCEPT) 250 MG capsule, Take 1,000 mg by mouth 2 (two) times daily., Disp: , Rfl:    PARoxetine (PAXIL) 10 MG tablet, Take 10 mg by mouth daily., Disp: , Rfl:    polyethylene glycol (MIRALAX / GLYCOLAX) packet, Take 17 g by mouth daily., Disp: , Rfl:    predniSONE (DELTASONE) 5 MG tablet, Take 5 mg by mouth daily with breakfast., Disp: , Rfl:    ranitidine (ZANTAC) 150 MG tablet, Take 150 mg by mouth every evening., Disp: , Rfl:    rosuvastatin (CRESTOR) 20 MG tablet, Take 1 tablet (20 mg total) by mouth daily., Disp: 90 tablet, Rfl: 0   sodium bicarbonate 650 MG tablet, Take 1,300 mg by mouth 2 (two) times daily., Disp: , Rfl:    tacrolimus (PROGRAF) 1 MG capsule, Take 4 mg by mouth 2 (two) times daily., Disp: , Rfl:    valGANciclovir (VALCYTE) 450 MG tablet, Take 450 mg by mouth daily., Disp: , Rfl:   Exam: Current vital signs: BP (!) 121/55 (BP Location: Left Leg)   Pulse (!) 105   Temp 98.1 F (36.7 C) (Oral)   Resp 20   Ht 5\' 2"  (1.575 m)   Wt 87.4 kg   SpO2 98%   BMI 35.24 kg/m  Vital signs in last 24 hours: Temp:  [98.1 F (36.7 C)] 98.1 F (36.7 C) (07/14 1339) Pulse Rate:  [105] 105 (07/14 1339) Resp:  [20] 20 (07/14 1339) BP: (121)/(55) 121/55 (07/14 1339) SpO2:  [98 %] 98 % (07/14 1339) Weight:  [86.6 kg-87.4 kg] 87.4 kg (07/14 1437) General: Awake alert in no distress HEENT: Normocephalic atraumatic Lungs: Clear Cardiovascular: Regular rhythm Abdomen nondistended nontender Extremities warm well perfused with bilateral upper extremity AV fistulas Neurological exam Awake alert oriented x3 No dysarthria No aphasia Cranial nerve examination  pupils are pinpoint and not reactive to light bilaterally, gaze is slightly disconjugate, she has field deficits in both eyes-mostly superior temporal quadrantanopsia as which are baseline-no new field deficits, facial sensation on the right face feels hyperesthetic, face symmetric, tongue and palate midline. Motor examination with no drift in any of the 4 extremities Sensation: Right hemibody has sensation of hyperesthesia comparison to the left also notes subjective tingling. Also notes hyperesthesia on left later on . Coordination exam with no dysmetria Gait testing deferred NIH stroke scale-2  Labs I have reviewed labs in epic and the results pertinent to this consultation are:  CBC    Component Value Date/Time   WBC 7.3 09/08/2021 1426  RBC 5.97 (H) 09/08/2021 1426   HGB 17.0 (H) 09/08/2021 1436   HCT 50.0 (H) 09/08/2021 1436   PLT 297 09/08/2021 1426   MCV 81.4 09/08/2021 1426   MCV 90.6 10/06/2014 1308   MCH 27.5 09/08/2021 1426   MCHC 33.7 09/08/2021 1426   RDW 12.8 09/08/2021 1426   LYMPHSABS 1.9 09/08/2021 1426   MONOABS 0.6 09/08/2021 1426   EOSABS 0.1 09/08/2021 1426   BASOSABS 0.0 09/08/2021 1426    CMP     Component Value Date/Time   NA 128 (L) 09/08/2021 1436   K 4.7 09/08/2021 1436   CL 99 09/08/2021 1436   CO2 20 (L) 05/12/2021 1012   GLUCOSE 687 (HH) 09/08/2021 1436   BUN 38 (H) 09/08/2021 1436   CREATININE 1.90 (H) 09/08/2021 1436   CALCIUM 10.5 (H) 05/12/2021 1012   PROT 7.2 05/12/2021 1012   ALBUMIN 4.0 05/12/2021 1012   AST 12 (L) 05/12/2021 1012   ALT 15 05/12/2021 1012   ALKPHOS 64 05/12/2021 1012   BILITOT 0.6 05/12/2021 1012   GFRNONAA 46 (L) 05/12/2021 1012   GFRAA 5 (L) 10/09/2014 1633    Lipid Panel     Component Value Date/Time   CHOL 124 05/13/2021 1238   TRIG 54 05/13/2021 1238   HDL 37 (L) 05/13/2021 1238   CHOLHDL 3.4 05/13/2021 1238   VLDL 11 05/13/2021 1238   LDLCALC 76 05/13/2021 1238     Imaging I have reviewed the  images obtained:  CT-head-chronic white matter disease, chronic lacunar infarctions, no acute changes.  No bleed.  MRI examination of the brain - see below  Assessment: 51 year old past history of ESRD status post renal transplant on immunosuppression, diabetes, retinal detachment bilaterally, hypertension, left thalamic stroke in March 2023 with residual symptoms presented for evaluation of right-sided tingling and numbness while she was at lunch with her husband last known well of 12 PM. Also later reported left sided numbness Blood sugar noted to be in the 600s in the ER. 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 I suspect her symptoms might be related to recrudescence of old symptoms in the setting of hyperglycemia or new small strokes.  Her symptoms are too mild to treat with IV thrombolysis irrespective.  Her examination is not consistent with an LVO.  Given her renal transplant history, I did not perform emergent head and neck vascular imaging with CTA.  She is also allergic to contrast and requires prep. Taking for stat MRI to r/o large stroke  Recommendations: Stat MRI-no neurological work-up if negative.  If positive, requires.  Work-up. Management of severe hyperglycemia per emergency room/medicine. We will follow imaging studies.   Addendum Preliminary review of DWI images shows a punctate infarct around the right thalamus as well as a punctate infarct by the superior aspect of the right lateral ventricle consistent with possible acute infarct-seen on axial but not very well on the coronal DWI.  Likely new punctate acute strokes-concomitant small vessel etiology given risk factors.  Given her symptoms are mild, will not treat with TNKase.  Also the fact that she has hyperglycemia gives me pause to offer  treatment for mild symptoms. She remains in the window for IV TNKase till 4:30 PM and should she worsen, we will reconsider IV thrombolysis. Needs admission for management of her sugars as well as stroke  work-up.  Updated recommendations - Admit to hospitalist or ICU as deemed appropriate for medical management - Frequent neurochecks - Telemetry - Continue aspirin and Plavix - Emergent management of hyperglycemia per the primary team. - 2D echo - MRA head without contrast - Carotid Dopplers - A1c - Lipid panel - PT, OT, speech therapy -Follow-up formal MRI read. - Allow for permissive hypertension-treat blood pressure only if the systolic is greater than 098 and do that only on a as needed basis.  Okay to normalize blood pressure after 48 to 72 hours. Discussed with Dr. Vanita Panda and patient's husband at bedside. After imaging , plan d/w husband and Dewaine Conger, PA-c Stroke team to follow.  -- Amie Portland, MD Neurologist [facility] Pager: [phone]   CRITICAL CARE ATTESTATION Performed by: Amie Portland, MD Total critical care time: 45 minutes Critical care time was exclusive of separately billable procedures and treating other patients and/or supervising APPs/Residents/Students Critical care was necessary to treat or prevent imminent or life-threatening deterioration due to stroke like symptoms, possible DKA  This patient is critically ill and at significant risk for neurological worsening and/or death and care requires constant monitoring. Critical care was time spent personally by me on the following activities: development of treatment plan with patient and/or surrogate as well as nursing, discussions with consultants, evaluation of patient's response to treatment, examination of patient, obtaining history from patient or surrogate, ordering and performing treatments and interventions, ordering and review of laboratory studies, ordering and review of radiographic  studies, pulse oximetry, re-evaluation of patient's condition, participation in multidisciplinary rounds and medical decision making of high complexity in the care of this patient.

## 2021-09-08 NOTE — ED Notes (Signed)
Neuro in CT to perform neuro assessment.

## 2021-09-08 NOTE — ED Notes (Signed)
PT reports left sided numbness has resolved back to her baseline except for in leg.

## 2021-09-08 NOTE — ED Notes (Signed)
Neuro at bedside.

## 2021-09-09 ENCOUNTER — Other Ambulatory Visit: Payer: Self-pay

## 2021-09-09 ENCOUNTER — Inpatient Hospital Stay (HOSPITAL_COMMUNITY): Payer: BC Managed Care – PPO

## 2021-09-09 ENCOUNTER — Inpatient Hospital Stay (HOSPITAL_BASED_OUTPATIENT_CLINIC_OR_DEPARTMENT_OTHER): Payer: BC Managed Care – PPO

## 2021-09-09 DIAGNOSIS — E1122 Type 2 diabetes mellitus with diabetic chronic kidney disease: Secondary | ICD-10-CM

## 2021-09-09 DIAGNOSIS — Z794 Long term (current) use of insulin: Secondary | ICD-10-CM

## 2021-09-09 DIAGNOSIS — N186 End stage renal disease: Secondary | ICD-10-CM

## 2021-09-09 DIAGNOSIS — I6389 Other cerebral infarction: Secondary | ICD-10-CM | POA: Diagnosis not present

## 2021-09-09 DIAGNOSIS — I739 Peripheral vascular disease, unspecified: Secondary | ICD-10-CM

## 2021-09-09 DIAGNOSIS — Z9861 Coronary angioplasty status: Secondary | ICD-10-CM

## 2021-09-09 DIAGNOSIS — I2510 Atherosclerotic heart disease of native coronary artery without angina pectoris: Secondary | ICD-10-CM

## 2021-09-09 DIAGNOSIS — D849 Immunodeficiency, unspecified: Secondary | ICD-10-CM

## 2021-09-09 DIAGNOSIS — I639 Cerebral infarction, unspecified: Secondary | ICD-10-CM | POA: Diagnosis not present

## 2021-09-09 DIAGNOSIS — Z992 Dependence on renal dialysis: Secondary | ICD-10-CM

## 2021-09-09 DIAGNOSIS — Z940 Kidney transplant status: Secondary | ICD-10-CM

## 2021-09-09 DIAGNOSIS — I669 Occlusion and stenosis of unspecified cerebral artery: Secondary | ICD-10-CM

## 2021-09-09 DIAGNOSIS — I10 Essential (primary) hypertension: Secondary | ICD-10-CM

## 2021-09-09 DIAGNOSIS — N179 Acute kidney failure, unspecified: Secondary | ICD-10-CM

## 2021-09-09 DIAGNOSIS — E875 Hyperkalemia: Secondary | ICD-10-CM

## 2021-09-09 LAB — ECHOCARDIOGRAM COMPLETE
AR max vel: 1.76 cm2
AV Area VTI: 1.87 cm2
AV Area mean vel: 1.74 cm2
AV Mean grad: 3 mmHg
AV Peak grad: 5.4 mmHg
Ao pk vel: 1.16 m/s
Area-P 1/2: 4.6 cm2
Calc EF: 53.6 %
Height: 62 in
S' Lateral: 3.4 cm
Single Plane A2C EF: 48.6 %
Single Plane A4C EF: 56.7 %
Weight: 3088 oz

## 2021-09-09 LAB — LIPID PANEL
Cholesterol: 119 mg/dL (ref 0–200)
HDL: 34 mg/dL — ABNORMAL LOW (ref >40)
LDL Cholesterol: 48 mg/dL (ref 0–99)
Total CHOL/HDL Ratio: 3.5 RATIO
Triglycerides: 183 mg/dL — ABNORMAL HIGH (ref <150)
VLDL: 37 mg/dL (ref 0–40)

## 2021-09-09 LAB — RENAL FUNCTION PANEL
Albumin: 3.1 g/dL — ABNORMAL LOW (ref 3.5–5.0)
Anion gap: 11 (ref 5–15)
BUN: 33 mg/dL — ABNORMAL HIGH (ref 6–20)
CO2: 22 mmol/L (ref 22–32)
Calcium: 9.4 mg/dL (ref 8.9–10.3)
Chloride: 105 mmol/L (ref 98–111)
Creatinine, Ser: 1.52 mg/dL — ABNORMAL HIGH (ref 0.44–1.00)
GFR, Estimated: 41 mL/min — ABNORMAL LOW (ref >60)
Glucose, Bld: 164 mg/dL — ABNORMAL HIGH (ref 70–99)
Phosphorus: 2.7 mg/dL (ref 2.5–4.6)
Potassium: 3.6 mmol/L (ref 3.5–5.1)
Sodium: 138 mmol/L (ref 135–145)

## 2021-09-09 LAB — HEMOGLOBIN A1C
Hgb A1c MFr Bld: 12.4 % — ABNORMAL HIGH (ref 4.8–5.6)
Mean Plasma Glucose: 309.18 mg/dL

## 2021-09-09 LAB — CBC
HCT: 45.8 % (ref 36.0–46.0)
Hemoglobin: 15.8 g/dL — ABNORMAL HIGH (ref 12.0–15.0)
MCH: 27.7 pg (ref 26.0–34.0)
MCHC: 34.5 g/dL (ref 30.0–36.0)
MCV: 80.4 fL (ref 80.0–100.0)
Platelets: 301 10*3/uL (ref 150–400)
RBC: 5.7 MIL/uL — ABNORMAL HIGH (ref 3.87–5.11)
RDW: 13 % (ref 11.5–15.5)
WBC: 6.9 10*3/uL (ref 4.0–10.5)
nRBC: 0 % (ref 0.0–0.2)

## 2021-09-09 LAB — GLUCOSE, CAPILLARY
Glucose-Capillary: 193 mg/dL — ABNORMAL HIGH (ref 70–99)
Glucose-Capillary: 263 mg/dL — ABNORMAL HIGH (ref 70–99)
Glucose-Capillary: 289 mg/dL — ABNORMAL HIGH (ref 70–99)
Glucose-Capillary: 217 mg/dL — ABNORMAL HIGH (ref 70–99)

## 2021-09-09 LAB — CK: Total CK: 96 U/L (ref 38–234)

## 2021-09-09 LAB — MAGNESIUM: Magnesium: 2 mg/dL (ref 1.7–2.4)

## 2021-09-09 MED ORDER — INSULIN ASPART 100 UNIT/ML IJ SOLN
0.0000 [IU] | Freq: Every day | INTRAMUSCULAR | Status: DC
  Administered 2021-09-09: 2 [IU] via SUBCUTANEOUS
  Administered 2021-09-10: 3 [IU] via SUBCUTANEOUS

## 2021-09-09 MED ORDER — TACROLIMUS 1 MG PO CAPS
3.0000 mg | ORAL_CAPSULE | Freq: Once | ORAL | Status: AC
Start: 2021-09-09 — End: 2021-09-09
  Administered 2021-09-09: 3 mg via ORAL
  Filled 2021-09-09: qty 3

## 2021-09-09 MED ORDER — INSULIN ASPART 100 UNIT/ML IJ SOLN
3.0000 [IU] | Freq: Three times a day (TID) | INTRAMUSCULAR | Status: DC
  Administered 2021-09-09 – 2021-09-11 (×5): 3 [IU] via SUBCUTANEOUS

## 2021-09-09 MED ORDER — TACROLIMUS 1 MG PO CAPS
4.0000 mg | ORAL_CAPSULE | Freq: Two times a day (BID) | ORAL | Status: DC
Start: 2021-09-09 — End: 2021-09-11
  Administered 2021-09-09 – 2021-09-11 (×4): 4 mg via ORAL
  Filled 2021-09-09 (×5): qty 4

## 2021-09-09 MED ORDER — INSULIN ASPART 100 UNIT/ML IJ SOLN
0.0000 [IU] | Freq: Three times a day (TID) | INTRAMUSCULAR | Status: DC
  Administered 2021-09-09: 8 [IU] via SUBCUTANEOUS
  Administered 2021-09-10: 11 [IU] via SUBCUTANEOUS
  Administered 2021-09-10: 3 [IU] via SUBCUTANEOUS
  Administered 2021-09-10: 8 [IU] via SUBCUTANEOUS
  Administered 2021-09-11: 2 [IU] via SUBCUTANEOUS

## 2021-09-09 MED ORDER — ASPIRIN 81 MG PO CHEW
81.0000 mg | CHEWABLE_TABLET | Freq: Once | ORAL | Status: AC
Start: 2021-09-09 — End: 2021-09-09
  Administered 2021-09-09: 81 mg via ORAL
  Filled 2021-09-09: qty 1

## 2021-09-09 MED ORDER — TICAGRELOR 90 MG PO TABS
90.0000 mg | ORAL_TABLET | Freq: Two times a day (BID) | ORAL | Status: DC
  Administered 2021-09-09 – 2021-09-11 (×5): 90 mg via ORAL
  Filled 2021-09-09 (×5): qty 1

## 2021-09-09 MED ORDER — BASAGLAR KWIKPEN 100 UNIT/ML ~~LOC~~ SOPN: 35.0000 [IU] | PEN_INJECTOR | Freq: Two times a day (BID) | SUBCUTANEOUS | 1 refills | Status: DC

## 2021-09-09 MED ORDER — ROSUVASTATIN CALCIUM 20 MG PO TABS
20.0000 mg | ORAL_TABLET | Freq: Every day | ORAL | Status: DC
  Administered 2021-09-09 – 2021-09-10 (×2): 20 mg via ORAL
  Filled 2021-09-09 (×2): qty 1

## 2021-09-09 MED ORDER — TICAGRELOR 90 MG PO TABS: 90.0000 mg | ORAL_TABLET | Freq: Two times a day (BID) | ORAL | 2 refills | Status: DC

## 2021-09-09 MED ORDER — ASPIRIN 81 MG PO CHEW
81.0000 mg | CHEWABLE_TABLET | Freq: Every day | ORAL | Status: DC
  Administered 2021-09-10 – 2021-09-11 (×2): 81 mg via ORAL
  Filled 2021-09-09 (×2): qty 1

## 2021-09-09 NOTE — Progress Notes (Addendum)
PROGRESS NOTE  Morgan [name] DTO:[id number] DOB: 1971/01/10   PCP: Awanda Mink, MD  Patient is from: Home.  Lives with husband.  Independent at baseline.  DOA: 09/08/2021 LOS: 1  Chief complaints Chief Complaint  Patient presents with   Hand Pain   Code Stroke     Brief Narrative / Interim history: 51 year old F with PMH of CKD s/p transplant on immunosuppressants, IDDM-2, CAD s/p stent on 6/29, PAD, CVA, HTN and HLD presenting with decreased left arm coordination and heaviness while eating brunch, postprandial nausea, polyuria and polydipsia for weeks.  Patient reports running out of [place].  Initially, she reported not taking her Plavix for 2 weeks but she later reports taking her Plavix and low-dose aspirin until the day of presentation.  She also reports running out of her immunosuppressants for 3 days  In ED, code stroke called.  CT head without contrast negative but MRI did show 2 punctate acute infarcts in the right external capsule and right periventricular frontal lobe.  She was also noted to be hyperglycemic but not in DKA or HHS.  She had AKI with Cr 2.05.   The next day, MRA head with severe but stable intracranial atherosclerosis without LVO.  Carotid ultrasound without significant finding.  TTE ordered and pending.  Neurology following.  Subjective: Seen and examined earlier this morning after she returned from MRA.  Reports improvement in her left arm heaviness although she is still has some numbness in her fingers.  She has chronic numbness in the right arm from previous stroke.  She is left-handed.  Denies weakness, acute vision change or difficulty speaking.  She denies chest pain or dyspnea.  Denies GI or UTI symptoms.  She reports difficulty affording the co-pays for her insulin and Plavix.  Objective: Vitals:   09/08/21 2031 09/08/21 2043 09/08/21 2310 09/09/21 0334  BP: 101/83  (!) 103/59 109/75  Pulse: 95  90 82  Resp: 20  18 18   Temp: 98.2  F (36.8 C)  98.6 F (37 C) 97.7 F (36.5 C)  TempSrc: Oral   Oral  SpO2: 100%  98% 100%  Weight:  87.5 kg    Height:        Examination:  GENERAL: No apparent distress.  Nontoxic. HEENT: MMM.  Vision and hearing grossly intact.  NECK: Supple.  No apparent JVD.  RESP:  No IWOB.  Fair aeration bilaterally. CVS:  RRR. Heart sounds normal.  ABD/GI/GU: BS+. Abd soft, NTND.  MSK/EXT:  Moves extremities. No apparent deformity. No edema.  SKIN: no apparent skin lesion or wound NEURO: Awake, alert and oriented appropriately. Speech clear. Cranial nerves II-XII grossly intact. Motor 5/5 in all muscle groups of UE and LE bilaterally, Normal tone. Light sensation grossly intact but not consistent.  Patellar reflex symmetric.  No pronator drift.  Finger to nose intact. PSYCH: Calm. Normal affect.   Procedures:  None  Microbiology summarized: [id number] and influenza PCR nonreactive.  Assessment and plan: Principal Problem:   Acute CVA (cerebrovascular accident) ([place]) Active Problems:   S/p cadaver renal transplant CKD stage IIIa   Diabetes mellitus, type 2 ([place])   Essential (primary) hypertension   Hyperlipidemia   Immunosuppression ([place])   Peripheral vascular disease, unspecified ([place])   Acute right MCA CVA involving right external capsule and right periventricular frontal lobe: Noted on MRI brain although I am not sure if this fully explains her symptoms specially her left hand paresthesia.  MRA with severe but stable intracranial stenosis involving  ACA and PCA but no LVO.Marland Kitchen  Carotid US without significant stenosis.  Patient has significant risk factors with poorly controlled diabetes, previous CVA, HTN and hyperlipidemia.  She was out of her meds including insulin, Plavix and transplant meds.  A1c 12.4%.  LDL 48. -Follow TTE -Neurology following -Neurology started Brilinta instead of home Plavix.  Sent Rx for Brilinta to check on a copy -Permissive hypertension for 2 to 3 days and  then SBP of about 130 -Follow-up PT/OT/SLP eval -TOC consulted for medication assistance  Intracranial arterial stenosis: MRA as above. -Optimize diabetic control and compliance with medication  Uncontrolled IDDM-2 with hyperglycemia: A1c 12.4% (10.2% about 4 months ago).  Reports not affording her insulin due to co-pays. Recent Labs  Lab 09/08/21 1433 09/08/21 1752 09/08/21 2118 09/09/21 0602 09/09/21 1243  GLUCAP 593* 283* 149* 193* 263*  -Increase SSI to moderate -Add NovoLog 3 units 3 times daily with meals -Continue Semglee 20 units at night -Sent Rx for Basaglar insulin to check on her copy -Further adjustment as appropriate. -Consult diabetic coronary and TOC   S/p cadaver renal transplant on immunosuppression: out of her medications for 3 days AKI on CKD-3A: Improved. Recent Labs    05/12/21 1012 09/08/21 1436 09/08/21 1543 09/09/21 0251  BUN 39* 38* 38* 33*  CREATININE 1.39* 1.90* 2.05* 1.52*  -Resume home transplant meds. -Continue monitoring  CAD s/p stents on 08/24/2021 at [facility]:  out of Plavix for 2 weeks?  No angina symptoms. -Now on Brilinta per neurology -Continue home statin  Essential hypertension: Normotensive. -Hold home antihypertensive meds  Mild Hyperkalemia: Resolved.  Mood disorder: -Continue home Plavix but not a great choice if not compliant  Morbid obesity Body mass index is 35.3 kg/m.          DVT prophylaxis:  enoxaparin (LOVENOX) injection 40 mg Start: 09/08/21 2200  Code Status: Full code Family Communication: Updated patient's husband at bedside Level of care: Telemetry Medical Status is: Inpatient Remains inpatient appropriate because: Acute right MCA CVA   Final disposition: Home once medically cleared Consultants:  Neurology  Sch Meds:  Scheduled Meds:  enoxaparin (LOVENOX) injection  40 mg Subcutaneous Q67Y   folic acid  1 mg Oral QPM   insulin aspart  0-15 Units Subcutaneous TID WC   insulin aspart  0-5  Units Subcutaneous QHS   insulin aspart  3 Units Subcutaneous TID WC   insulin glargine-yfgn  20 Units Subcutaneous QHS   mycophenolate  250 mg Oral QHS   mycophenolate  500 mg Oral Daily   PARoxetine  10 mg Oral Daily   polyethylene glycol  17 g Oral Daily   predniSONE  5 mg Oral Q breakfast   rosuvastatin  20 mg Oral Daily   sodium bicarbonate  1,300 mg Oral BID   tacrolimus  4 mg Oral BID   ticagrelor  90 mg Oral BID   Continuous Infusions: PRN Meds:.acetaminophen **OR** acetaminophen (TYLENOL) oral liquid 160 mg/5 mL **OR** acetaminophen, HYDROcodone-acetaminophen, senna-docusate  Antimicrobials: Anti-infectives (From admission, onward)    None        I have personally reviewed the following labs and images: CBC: Recent Labs  Lab 09/08/21 1426 09/08/21 1436 09/09/21 0251  WBC 7.3  --  6.9  NEUTROABS 4.6  --   --   HGB 16.4* 17.0* 15.8*  HCT 48.6* 50.0* 45.8  MCV 81.4  --  80.4  PLT 297  --  301   BMP &GFR Recent Labs  Lab 09/08/21 1436  09/08/21 1543 09/09/21 0251  NA 128* 132* 138  K 4.7 5.2* 3.6  CL 99 95* 105  CO2  --  21* 22  GLUCOSE 687* 527* 164*  BUN 38* 38* 33*  CREATININE 1.90* 2.05* 1.52*  CALCIUM  --  10.5* 9.4  MG  --   --  2.0  PHOS  --   --  2.7   Estimated Creatinine Clearance: 45 mL/min (A) (by C-G formula based on SCr of 1.52 mg/dL (H)). Liver & Pancreas: Recent Labs  Lab 09/08/21 1543 09/09/21 0251  AST 29  --   ALT 24  --   ALKPHOS 100  --   BILITOT 1.0  --   PROT 6.8  --   ALBUMIN 3.6 3.1*   No results for input(s): "LIPASE", "AMYLASE" in the last 168 hours. No results for input(s): "AMMONIA" in the last 168 hours. Diabetic: Recent Labs    09/09/21 0251  HGBA1C 12.4*   Recent Labs  Lab 09/08/21 1433 09/08/21 1752 09/08/21 2118 09/09/21 0602 09/09/21 1243  GLUCAP 593* 283* 149* 193* 263*   Cardiac Enzymes: Recent Labs  Lab 09/09/21 0251  CKTOTAL 96   No results for input(s): "PROBNP" in the last 8760  hours. Coagulation Profile: Recent Labs  Lab 09/08/21 1426  INR 1.0   Thyroid Function Tests: No results for input(s): "TSH", "T4TOTAL", "FREET4", "T3FREE", "THYROIDAB" in the last 72 hours. Lipid Profile: Recent Labs    09/09/21 0251  CHOL 119  HDL 34*  LDLCALC 48  TRIG 183*  CHOLHDL 3.5   Anemia Panel: No results for input(s): "VITAMINB12", "FOLATE", "FERRITIN", "TIBC", "IRON", "RETICCTPCT" in the last 72 hours. Urine analysis:    Component Value Date/Time   COLORURINE ORANGE (A) 09/22/2014 1415   APPEARANCEUR CLOUDY (A) 09/22/2014 1415   LABSPEC 1.014 09/22/2014 1415   PHURINE 8.0 09/22/2014 1415   GLUCOSEU 500 (A) 09/22/2014 1415   HGBUR LARGE (A) 09/22/2014 1415   BILIRUBINUR neg 10/06/2014 1359   KETONESUR 15 (A) 09/22/2014 1415   PROTEINUR 100 10/06/2014 1359   PROTEINUR 100 (A) 09/22/2014 1415   UROBILINOGEN 0.2 10/06/2014 1359   UROBILINOGEN 0.2 09/22/2014 1415   NITRITE neg 10/06/2014 1359   NITRITE NEGATIVE 09/22/2014 1415   LEUKOCYTESUR Negative 10/06/2014 1359   Sepsis Labs: Invalid input(s): "PROCALCITONIN", "LACTICIDVEN"  Microbiology: Recent Results (from the past 240 hour(s))  Resp Panel by RT-PCR (Flu A&B, Covid) Anterior Nasal Swab     Status: None   Collection Time: 09/08/21  2:26 PM   Specimen: Anterior Nasal Swab  Result Value Ref Range Status   SARS Coronavirus 2 by RT PCR NEGATIVE NEGATIVE Final    Comment: (NOTE) SARS-CoV-2 target nucleic acids are NOT DETECTED.  The SARS-CoV-2 RNA is generally detectable in upper respiratory specimens during the acute phase of infection. The lowest concentration of SARS-CoV-2 viral copies this assay can detect is 138 copies/mL. A negative result does not preclude SARS-Cov-2 infection and should not be used as the sole basis for treatment or other patient management decisions. A negative result may occur with  improper specimen collection/handling, submission of specimen other than nasopharyngeal  swab, presence of viral mutation(s) within the areas targeted by this assay, and inadequate number of viral copies(<138 copies/mL). A negative result must be combined with clinical observations, patient history, and epidemiological information. The expected result is Negative.  Fact Sheet for Patients:  EntrepreneurPulse.com.au  Fact Sheet for Healthcare Providers:  IncredibleEmployment.be  This test is no t yet approved or  cleared by the [place] and  has been authorized for detection and/or diagnosis of SARS-CoV-2 by FDA under an Emergency Use Authorization (EUA). This EUA will remain  in effect (meaning this test can be used) for the duration of the COVID-19 declaration under Section 564(b)(1) of the Act, 21 U.S.C.section 360bbb-3(b)(1), unless the authorization is terminated  or revoked sooner.       Influenza A by PCR NEGATIVE NEGATIVE Final   Influenza B by PCR NEGATIVE NEGATIVE Final    Comment: (NOTE) The Xpert Xpress SARS-CoV-2/FLU/RSV plus assay is intended as an aid in the diagnosis of influenza from Nasopharyngeal swab specimens and should not be used as a sole basis for treatment. Nasal washings and aspirates are unacceptable for Xpert Xpress SARS-CoV-2/FLU/RSV testing.  Fact Sheet for Patients: EntrepreneurPulse.com.au  Fact Sheet for Healthcare Providers: IncredibleEmployment.be  This test is not yet approved or cleared by the [place] FDA and has been authorized for detection and/or diagnosis of SARS-CoV-2 by FDA under an Emergency Use Authorization (EUA). This EUA will remain in effect (meaning this test can be used) for the duration of the COVID-19 declaration under Section 564(b)(1) of the Act, 21 U.S.C. section 360bbb-3(b)(1), unless the authorization is terminated or revoked.  Performed at [facility], [place] [address], [place], [place] [address]      Radiology Studies: VAS US CAROTID  Result Date: 09/09/2021 Carotid Arterial Duplex Study Patient Name:  ULYANA PITONES  Date of Exam:   09/09/2021 Medical Rec #: [id number]                 Accession #:    [id number] Date of Birth: January 13, 1971                 Patient Gender: F Patient Age:   24 years Exam Location:  [facility] Procedure:      VAS US CAROTID Referring Phys: Bretta Bang Farah Lepak --------------------------------------------------------------------------------  Indications:       CVA and Lack of coordination with left arm, visual                    disturbance. Risk Factors:      Hypertension, hyperlipidemia, Diabetes, prior CVA. Other Factors:     Severe Intracranial Atherosclerosis                    Multifocal high-grade anterior and posterior                    circulation stenoses, most notable for:                    - Severe mid Basilar stenosis.                    - Severe bilateral ACA origin and A2 stenoses.                    - Severe right greater than left bilateral PCA stenoses. Comparison Study:  No prior study Performing Technologist: Sharion Dove RVS  Examination Guidelines: A complete evaluation includes B-mode imaging, spectral Doppler, color Doppler, and power Doppler as needed of all accessible portions of each vessel. Bilateral testing is considered an integral part of a complete examination. Limited examinations for reoccurring indications may be performed as noted.  Right Carotid Findings: +----------+--------+--------+--------+------------------+--------+           PSV cm/sEDV cm/sStenosisPlaque DescriptionComments +----------+--------+--------+--------+------------------+--------+ CCA Prox  82  20              homogeneous                +----------+--------+--------+--------+------------------+--------+ CCA Distal83      11              homogeneous                +----------+--------+--------+--------+------------------+--------+ ICA  Prox  74      15              heterogenous               +----------+--------+--------+--------+------------------+--------+ ICA Mid   71      19                                         +----------+--------+--------+--------+------------------+--------+ ICA Distal78      17                                         +----------+--------+--------+--------+------------------+--------+ ECA       132     2                                          +----------+--------+--------+--------+------------------+--------+ +----------+--------+-------+--------+-------------------+           PSV cm/sEDV cmsDescribeArm Pressure (mmHG) +----------+--------+-------+--------+-------------------+ Subclavian210                                        +----------+--------+-------+--------+-------------------+ +---------+--------+--+--------+-+ VertebralPSV cm/s55EDV cm/s5 +---------+--------+--+--------+-+  Left Carotid Findings: +----------+--------+--------+--------+------------------+--------+           PSV cm/sEDV cm/sStenosisPlaque DescriptionComments +----------+--------+--------+--------+------------------+--------+ CCA Prox  70      15              homogeneous                +----------+--------+--------+--------+------------------+--------+ CCA Distal67      11              homogeneous                +----------+--------+--------+--------+------------------+--------+ ICA Prox  86      17              heterogenous               +----------+--------+--------+--------+------------------+--------+ ICA Mid   73      20                                         +----------+--------+--------+--------+------------------+--------+ ICA Distal71      21                                         +----------+--------+--------+--------+------------------+--------+ ECA       144     9                                           +----------+--------+--------+--------+------------------+--------+ +----------+--------+--------+--------+-------------------+  PSV cm/sEDV cm/sDescribeArm Pressure (mmHG) +----------+--------+--------+--------+-------------------+ [id number]                                         +----------+--------+--------+--------+-------------------+ +---------+--------+--+--------+--+ VertebralPSV cm/s72EDV cm/s11 +---------+--------+--+--------+--+   Summary: Right Carotid: Velocities in the right ICA are consistent with a 1-39% stenosis. Left Carotid: Velocities in the left ICA are consistent with a 1-39% stenosis. Vertebrals:  Bilateral vertebral arteries demonstrate antegrade flow. Subclavians: Normal flow hemodynamics were seen in bilateral subclavian              arteries. *See table(s) above for measurements and observations.  Electronically signed by Antony Contras MD on 09/09/2021 at 11:39:15 AM.    Final    MR ANGIO HEAD WO CONTRAST  Result Date: 09/09/2021 CLINICAL DATA:  51 year old female with neurologic deficit. MRI suggesting acute small vessel infarcts in the right frontal lobe white matter yesterday. EXAM: MRA HEAD WITHOUT CONTRAST TECHNIQUE: Angiographic images of the Circle of Willis were acquired using MRA technique without intravenous contrast. COMPARISON:  Brain MRI 09/08/2021.  Intracranial MRA 05/12/2021. FINDINGS: Anterior circulation: Antegrade flow signal in both ICA siphons appears stable since March. Mild siphon irregularity in keeping with atherosclerosis but no hemodynamically significant siphon stenosis identified. Bilateral ophthalmic and posterior communicating artery origins remain patent. Stable carotid termini with mild left MCA origin but moderate to severe left greater than right ACA origin stenoses (series 252, image 12). Both A1 segments remain patent. However, there is severe bilateral distal A2 segment stenosis, stable (series 253, image 1).  Additional fronto polar branch stenosis also appears chronic. Right MCA M1 segment and bifurcation are patent without stenosis. Mild to moderate posterior right M2 branch irregularity and stenosis appears stable. Left MCA M1 irregularity and stenosis appears mild. Patent left MCA bifurcation. Moderate and occasionally severe left MCA branch irregularity and stenosis appears chronic and stable (series 252, image 17). Posterior circulation: Tenuous antegrade flow in the posterior circulation does not appear significantly changed from the March CTA. Diminutive and stenotic basilar artery, with segmental flow gaps but maintained flow signal at the basilar tip in the setting of fetal type bilateral PCA origins. SCA origins remain patent. Diminutive distal vertebral arteries appear patent without significant stenosis. Both PICA origins remain patent. Moderate to severe bilateral posterior communicating arteries stenoses at the junctions with the P2 segments. Additional severe right PCA P2 segment stenosis (series 255, image 15). Attenuated bilateral distal PCA branch flow signal. These findings are stable since March. Anatomic variants: Fetal type bilateral PCA origins. Other: No intracranial mass effect. IMPRESSION: 1. Stable MRI appearance of Severe Intracranial Atherosclerosis since March. Multifocal high-grade anterior and posterior circulation stenoses, most notable for: - Severe mid Basilar stenosis. - Severe bilateral ACA origin and A2 stenoses. - Severe right greater than left bilateral PCA stenoses. 2. No large vessel occlusion. Electronically Signed   By: Genevie Ann M.D.   On: 09/09/2021 10:17   MR BRAIN WO CONTRAST  Result Date: 09/08/2021 CLINICAL DATA:  Neuro deficit, acute, stroke suspected EXAM: MRI HEAD WITHOUT CONTRAST TECHNIQUE: Multiplanar, multiecho pulse sequences of the brain and surrounding structures were obtained without intravenous contrast. COMPARISON:  Same day CT.  MRI May 12, 2021.  FINDINGS: Brain: Possible punctate acute infarct in the right external capsule (for example series 2 and 250, image 22) and right periventricular frontal lobe (series 2 and 250, image 31). No acute hemorrhage, hydrocephalus, extra-axial  collection or mass lesion. Small remote infarcts in the thalamus bilaterally. Cerebral atrophy. Scattered T2/FLAIR hyperintensities the white matter, nonspecific but compatible with chronic microvascular ischemic disease. Vascular: Major arterial flow voids are maintained at the skull base. Skull and upper cervical spine: Normal marrow signal. Sinuses/Orbits: Clear sinuses.  No acute orbital findings. Other: No mastoid effusions. IMPRESSION: 1. Possible punctate acute infarcts in the right external capsule and right periventricular frontal lobe. 2. Small remote thalamic lacunar infarcts. Electronically Signed   By: Margaretha Sheffield M.D.   On: 09/08/2021 15:46   CT HEAD CODE STROKE WO CONTRAST  Result Date: 09/08/2021 CLINICAL DATA:  Code stroke.  Acute right-sided tingling. EXAM: CT HEAD WITHOUT CONTRAST TECHNIQUE: Contiguous axial images were obtained from the base of the skull through the vertex without intravenous contrast. RADIATION DOSE REDUCTION: This exam was performed according to the departmental dose-optimization program which includes automated exposure control, adjustment of the mA and/or kV according to patient size and/or use of iterative reconstruction technique. COMPARISON:  CT head 05/12/2021. MR head 05/12/2021 FINDINGS: Brain: Remote lacunar infarcts are again noted within the right external capsule and thalamus. Mild diffuse periventricular white matter hypoattenuation is advanced for age. No acute infarct, hemorrhage, or mass lesion is present. The ventricles are of normal size. No significant extraaxial fluid collection is present. Vascular: Atherosclerotic calcifications are present within the cavernous internal carotid arteries bilaterally. No hyperdense  vessel is present. Skull: Calvarium is intact. No focal lytic or blastic lesions are present. No significant extracranial soft tissue lesion is present. Sinuses/Orbits: Chronic right sphenoid sinus disease again noted. The paranasal sinuses and mastoid air cells are otherwise clear. Bilateral lens replacements are noted. Globes and orbits are otherwise unremarkable. ASPECTS [facility] Stroke Program Early CT Score) - Ganglionic level infarction (caudate, lentiform nuclei, internal capsule, insula, M1-M3 cortex): 7/7 - Supraganglionic infarction (M4-M6 cortex): 3/3 Total score (0-10 with 10 being normal): 10/10 IMPRESSION: 1. No acute intracranial abnormality or significant interval change. 2. Stable remote lacunar infarcts of the right external capsule and thalamus. 3. Stable atrophy and white matter disease. This likely reflects the sequela of chronic microvascular ischemia. 4. ASPECTS is 10/10. The above was relayed via text pager to Dr. Rory Percy on 09/08/2021 at 14:56 . Electronically Signed   By: San Morelle M.D.   On: 09/08/2021 14:56      Joleena Weisenburger [place]  If 7PM-7AM, please contact night-coverage [email or web address] 09/09/2021, 1:42 PM

## 2021-09-09 NOTE — Evaluation (Signed)
Occupational Therapy Evaluation Patient Details Name: Morgan [name] MRN: [id number] DOB: [birth year] Today's Date: 09/09/2021   History of Present Illness Pt is a 51 y/o female admitted secondary to L hand/arm weakness. Found to have infarct in R external capsule and R periventricular frontal lobe. PMH includes recent cardiac stent placement, ESRD s/p transplant, bilateral retinal detachment, HTN, and CVA.   Clinical Impression   Pt reported they were going to OP cardiac rehab and completed limited ambulation with holding onto husband for support or a shopping cart. Pt reported due to past hx of stroke has had limitations in RUE with coordination and sensation and has needed some assist with donning bra and cooking. Pt at this time reproted FM of LUE is almost back to their baseline but takes increase in time to complete FM activities. Pt was able to complete LB ADLS with min guard and sit to stand with min guard. Pt currently with functional limitations due to the deficits listed below (see OT Problem List).  Pt will benefit from skilled OT to increase their safety and independence with ADL and functional mobility for ADL to facilitate discharge to venue listed below.        Recommendations for follow up therapy are one component of a multi-disciplinary discharge planning process, led by the attending physician.  Recommendations may be updated based on patient status, additional functional criteria and insurance authorization.   Follow Up Recommendations  Other (comment) (Return to OP cardiac rehab then to progress to OP Neuro once cleared from cardiac therapy)    Assistance Recommended at Discharge Frequent or constant Supervision/Assistance  Patient can return home with the following A little help with walking and/or transfers;A little help with bathing/dressing/bathroom;Assistance with cooking/housework;Assist for transportation    Functional Status Assessment  Patient has had a  recent decline in their functional status and demonstrates the ability to make significant improvements in function in a reasonable and predictable amount of time.  Equipment Recommendations  Tub/shower bench    Recommendations for Other Services       Precautions / Restrictions Precautions Precautions: Fall Restrictions Weight Bearing Restrictions: No      Mobility Bed Mobility Overal bed mobility: Needs Assistance Bed Mobility: Sit to Supine       Sit to supine: Modified independent (Device/Increase time), HOB elevated (bed rail)   General bed mobility comments: slow due to changes in postion can affect dizziness but reported this happened at PLOF    Transfers Overall transfer level: Needs assistance   Transfers: Sit to/from Stand Sit to Stand: Min guard                  Balance Overall balance assessment: Needs assistance Sitting-balance support: Feet supported Sitting balance-Leahy Scale: Good     Standing balance support: No upper extremity supported Standing balance-Leahy Scale: Fair Standing balance comment: Pt with changes in head position needs increase in time to turn to object                           ADL either performed or assessed with clinical judgement   ADL Overall ADL's : Needs assistance/impaired Eating/Feeding: Set up;Sitting Eating/Feeding Details (indicate cue type and reason): depedning on how tight opening is to complete Grooming: Min guard;Cueing for safety;Cueing for sequencing;Sitting;Standing   Upper Body Bathing: Minimal assistance;Sitting   Lower Body Bathing: Min guard;Sit to/from stand Lower Body Bathing Details (indicate cue type and reason): Pt reported dizziness with  changes in position but this is also baseline Upper Body Dressing : Minimal assistance;Cueing for safety;Cueing for sequencing;Sitting   Lower Body Dressing: Min guard;Cueing for safety;Cueing for sequencing;Sit to/from stand   Toilet  Transfer: Min guard;Cueing for safety;Cueing for sequencing   Toileting- Water quality scientist and Hygiene: Min guard;Sit to/from stand       Functional mobility during ADLs: Min guard;Cueing for safety;Cueing for sequencing (Pt become dizzy with changes in position of head)       Vision         Perception     Praxis      Pertinent Vitals/Pain Pain Assessment Pain Assessment: 0-10 Pain Score: 1  Pain Location: R hand due to PMH of decrease in sensation Pain Descriptors / Indicators: Tingling, Tightness Pain Intervention(s): Limited activity within patient's tolerance     Hand Dominance Left   Extremity/Trunk Assessment Upper Extremity Assessment Upper Extremity Assessment: RUE deficits/detail;LUE deficits/detail RUE Deficits / Details: Pt has hx of decrease in sensation to touch, cold/hot, decrease in coordination and strength RUE Sensation: decreased proprioception;decreased light touch RUE Coordination: decreased fine motor LUE Deficits / Details: Pt reported not back to baseline but much better then it was when signs first came up LUE Sensation: decreased light touch;decreased proprioception LUE Coordination: decreased fine motor   Lower Extremity Assessment Lower Extremity Assessment: Defer to PT evaluation       Communication Communication Communication: No difficulties   Cognition Arousal/Alertness: Awake/alert Behavior During Therapy: WFL for tasks assessed/performed Overall Cognitive Status: Within Functional Limits for tasks assessed                                 General Comments: Pt noted slow to answer     General Comments       Exercises     Shoulder Instructions      Home Living Family/patient expects to be discharged to:: Private residence Living Arrangements: Spouse/significant other Available Help at Discharge: Family;Available PRN/intermittently Type of Home: House Home Access: Stairs to enter [employer]  of Steps: 2 Entrance Stairs-Rails: None Home Layout: Two level;Able to live on main level with bedroom/bathroom     Bathroom Shower/Tub: Teacher, early years/pre: Standard     Home Equipment: None   Additional Comments: husband works 1-10p, pt is a Editor, commissioning Prior Level of Function : Independent/Modified Independent;Driving;Working/employed             Mobility Comments: Pt reported some limited abilities due to recent stent placements ADLs Comments: assist with bra due to decrease in sensation        OT Problem List: Decreased strength;Decreased range of motion;Decreased activity tolerance;Impaired balance (sitting and/or standing);Decreased safety awareness;Decreased knowledge of use of DME or AE;Pain;Impaired UE functional use      OT Treatment/Interventions: Self-care/ADL training;Therapeutic exercise;Neuromuscular education;DME and/or AE instruction;Therapeutic activities;Patient/family education;Balance training    OT Goals(Current goals can be found in the care plan section) Acute Rehab OT Goals Patient Stated Goal: to return back to completion of job OT Goal Formulation: With patient Time For Goal Achievement: 09/23/21 Potential to Achieve Goals: Good ADL Goals Pt Will Perform Grooming: Independently;standing Pt Will Perform Upper Body Bathing: with set-up;sitting Pt Will Perform Lower Body Bathing: Independently;sitting/lateral leans Pt Will Transfer to Toilet: with modified independence;ambulating Pt Will Perform Tub/Shower Transfer: Tub transfer;with modified independence;tub bench;ambulating  OT Frequency: Min 2X/week    Co-evaluation PT/OT/SLP  Co-Evaluation/Treatment: Yes Reason for Co-Treatment: Complexity of the patient's impairments (multi-system involvement)   OT goals addressed during session: ADL's and self-care      AM-PAC OT "6 Clicks" Daily Activity     Outcome Measure Help from another person  eating meals?: A Little Help from another person taking care of personal grooming?: A Little Help from another person toileting, which includes using toliet, bedpan, or urinal?: A Little Help from another person bathing (including washing, rinsing, drying)?: A Lot Help from another person to put on and taking off regular upper body clothing?: A Little Help from another person to put on and taking off regular lower body clothing?: A Little 6 Click Score: 17   End of Session Equipment Utilized During Treatment: Gait belt  Activity Tolerance: Patient tolerated treatment well Patient left: in bed (transport took to MRI)  OT Visit Diagnosis: Unsteadiness on feet (R26.81);Other abnormalities of gait and mobility (R26.89);Muscle weakness (generalized) (M62.81);Dizziness and giddiness (R42);Pain Pain - Right/Left: Right                Time: 1610-9604 OT Time Calculation (min): 28 min Charges:  OT General Charges $OT Visit: 1 Visit  Joeseph Amor OTR/L  Acute Rehab Services  [phone] office number [phone] pager number   Joeseph Amor 09/09/2021, 9:30 AM

## 2021-09-09 NOTE — Progress Notes (Addendum)
STROKE TEAM PROGRESS NOTE   INTERVAL HISTORY Her husband is at the bedside.  Patient is awake and alert, following commands. EOMI, no aphasia or dysarthria. Able to tell a coherent history. bilateral uppers 5/5 no drift. Lowers 5/5. Sensation symmetric. She states she ran out of insulin recently. C/o slight heaviness of left arm and right arm numbness which appear to be improving.  She has some residual right-sided numbness from the previous left thalamic stroke in March 2023.  MRI scan of the brain shows tiny right subinsular punctate infarct.  MR angiogram shows diffuse multifocal intracranial atherosclerosis..  Will start ASA and brilinta long-term as patient has significant coronary artery disease as well (Was on Plavix at home PTA)  Vitals:   09/08/21 2031 09/08/21 2043 09/08/21 2310 09/09/21 0334  BP: 101/83  (!) 103/59 109/75  Pulse: 95  90 82  Resp: 20  18 18   Temp: 98.2 F (36.8 C)  98.6 F (37 C) 97.7 F (36.5 C)  TempSrc: Oral   Oral  SpO2: 100%  98% 100%  Weight:  87.5 kg    Height:       CBC:  Recent Labs  Lab 09/08/21 1426 09/08/21 1436 09/09/21 0251  WBC 7.3  --  6.9  NEUTROABS 4.6  --   --   HGB 16.4* 17.0* 15.8*  HCT 48.6* 50.0* 45.8  MCV 81.4  --  80.4  PLT 297  --  341   Basic Metabolic Panel:  Recent Labs  Lab 09/08/21 1436 09/08/21 1543  NA 128* 132*  K 4.7 5.2*  CL 99 95*  CO2  --  21*  GLUCOSE 687* 527*  BUN 38* 38*  CREATININE 1.90* 2.05*  CALCIUM  --  10.5*   Lipid Panel:  Recent Labs  Lab 09/09/21 0251  CHOL 119  TRIG 183*  HDL 34*  CHOLHDL 3.5  VLDL 37  LDLCALC 48   HgbA1c:  Recent Labs  Lab 09/09/21 0251  HGBA1C 12.4*   Urine Drug Screen: No results for input(s): "LABOPIA", "COCAINSCRNUR", "LABBENZ", "AMPHETMU", "THCU", "LABBARB" in the last 168 hours.  Alcohol Level No results for input(s): "ETH" in the last 168 hours.  IMAGING past 24 hours MR BRAIN WO CONTRAST  Result Date: 09/08/2021 CLINICAL DATA:  Neuro deficit,  acute, stroke suspected EXAM: MRI HEAD WITHOUT CONTRAST TECHNIQUE: Multiplanar, multiecho pulse sequences of the brain and surrounding structures were obtained without intravenous contrast. COMPARISON:  Same day CT.  MRI May 12, 2021. FINDINGS: Brain: Possible punctate acute infarct in the right external capsule (for example series 2 and 250, image 22) and right periventricular frontal lobe (series 2 and 250, image 31). No acute hemorrhage, hydrocephalus, extra-axial collection or mass lesion. Small remote infarcts in the thalamus bilaterally. Cerebral atrophy. Scattered T2/FLAIR hyperintensities the white matter, nonspecific but compatible with chronic microvascular ischemic disease. Vascular: Major arterial flow voids are maintained at the skull base. Skull and upper cervical spine: Normal marrow signal. Sinuses/Orbits: Clear sinuses.  No acute orbital findings. Other: No mastoid effusions. IMPRESSION: 1. Possible punctate acute infarcts in the right external capsule and right periventricular frontal lobe. 2. Small remote thalamic lacunar infarcts. Electronically Signed   By: Margaretha Sheffield M.D.   On: 09/08/2021 15:46   CT HEAD CODE STROKE WO CONTRAST  Result Date: 09/08/2021 CLINICAL DATA:  Code stroke.  Acute right-sided tingling. EXAM: CT HEAD WITHOUT CONTRAST TECHNIQUE: Contiguous axial images were obtained from the base of the skull through the vertex without intravenous contrast. RADIATION  DOSE REDUCTION: This exam was performed according to the departmental dose-optimization program which includes automated exposure control, adjustment of the mA and/or kV according to patient size and/or use of iterative reconstruction technique. COMPARISON:  CT head 05/12/2021. MR head 05/12/2021 FINDINGS: Brain: Remote lacunar infarcts are again noted within the right external capsule and thalamus. Mild diffuse periventricular white matter hypoattenuation is advanced for age. No acute infarct, hemorrhage, or mass  lesion is present. The ventricles are of normal size. No significant extraaxial fluid collection is present. Vascular: Atherosclerotic calcifications are present within the cavernous internal carotid arteries bilaterally. No hyperdense vessel is present. Skull: Calvarium is intact. No focal lytic or blastic lesions are present. No significant extracranial soft tissue lesion is present. Sinuses/Orbits: Chronic right sphenoid sinus disease again noted. The paranasal sinuses and mastoid air cells are otherwise clear. Bilateral lens replacements are noted. Globes and orbits are otherwise unremarkable. ASPECTS [facility] Stroke Program Early CT Score) - Ganglionic level infarction (caudate, lentiform nuclei, internal capsule, insula, M1-M3 cortex): 7/7 - Supraganglionic infarction (M4-M6 cortex): 3/3 Total score (0-10 with 10 being normal): 10/10 IMPRESSION: 1. No acute intracranial abnormality or significant interval change. 2. Stable remote lacunar infarcts of the right external capsule and thalamus. 3. Stable atrophy and white matter disease. This likely reflects the sequela of chronic microvascular ischemia. 4. ASPECTS is 10/10. The above was relayed via text pager to Dr. Rory Percy on 09/08/2021 at 14:56 . Electronically Signed   By: San Morelle M.D.   On: 09/08/2021 14:56    PHYSICAL EXAM Obese middle-aged African-American lady not in distress. . Afebrile. Head is nontraumatic. Neck is supple without bruit.    Cardiac exam no murmur or gallop. Lungs are clear to auscultation. Distal pulses are well felt.  Patient is awake and alert, oriented x 4 following commands. EOMI, no aphasia or dysarthria. No facial droop. Able to tell a coherent history. bilateral uppers 5/5 no drift. Lowers 5/5. Sensation symmetric.No ataxia  ASSESSMENT/PLAN [name] is a 51 y.o. female with history of  CKD s/p transplant on immunosuppression, DM2, CVA, HTN, HLD, PVD who presents with complaints of decreased  left arm   Stroke:  Acute right punctate ischemic infarcts in the right external capsule and periventricular frontal lobe in the setting of dehydration from Hyperglycemia and background of severe multifocal intracranial atherosclerotic disease Etiology:  chronic small vessel disease with underlying multifocal intracranial atherosclerosis Code Stroke CT head -No acute intracranial abnormality or significant interval change. Stable remote lacunar infarcts of the right external capsule and thalamus. Stable atrophy and white matter disease. This likely reflects the sequela of chronic microvascular ischemia. MRI   1. Possible punctate acute infarcts in the right external capsule and right periventricular frontal lobe. 2. Small remote thalamic lacunar infarcts MRA   1. Stable MRI appearance of Severe Intracranial Atherosclerosis since March. Multifocal high-grade anterior and posterior circulation stenoses, most notable for: - Severe mid Basilar stenosis. - Severe bilateral ACA origin and A2 stenoses. - Severe right greater than left bilateral PCA stenoses. 2. No large vessel occlusion. Carotid Doppler   Right Carotid: Velocities in the right ICA are consistent with a 1-39% stenosis.  Left Carotid: Velocities in the left ICA are consistent with a 1-39% stenosis.  Vertebrals:  Bilateral vertebral arteries demonstrate antegrade flow.  Subclavians: Normal flow hemodynamics were seen in bilateral subclavian arteries.  2D Echo pending LDL 48 HgbA1c 12.4 VTE prophylaxis - Lovenox    Diet   Diet NPO time specified Except  for: Sips with Meds, Ice Chips   aspirin 81 mg daily and clopidogrel 75 mg daily prior to admission, now on aspirin 81 mg daily and Brilinta (ticagrelor) 90 mg bid. For long-term given significant concurrent coronary artery disease  therapy recommendations:  pending Disposition:  pending  Hypertension Home meds:  amlodipine Stable Permissive hypertension (OK if < 220/120) but  gradually normalize in 5-7 days Long-term BP goal normotensive  Hyperlipidemia Home meds:  Crestor, resumed in hospital LDL 48, goal < 70 Continue statin at discharge  Diabetes type II UnControlled Home meds:  insulin HgbA1c 12.4, goal < 7.0 CBGs Recent Labs    09/08/21 1752 09/08/21 2118 09/09/21 0602  GLUCAP 283* 149* 193*   SSI Will need close monitoring with PCP   Other Stroke Risk Factors Obesity, Body mass index is 35.3 kg/m., BMI >/= 30 associated with increased stroke risk, recommend weight loss, diet and exercise as appropriate  Hx stroke/TIA Coronary artery disease   Other Active Problems S/p renal transplant on immunosuppression  Hospital day # 1  Beulah Gandy DNP, ACNPC-AG I have personally obtained history,examined this patient, reviewed notes, independently viewed imaging studies, participated in medical decision making and plan of care.ROS completed by me personally and pertinent positives fully documented  I have made any additions or clarifications directly to the above note. Agree with note above.  Patient presented with increased right arm numbness and some left arm heaviness in the setting of severe hyperglycemia and MRI shows tiny punctate right external capsule/subinsular infarct and MRA shows severe multifocal intracranial atherosclerosis.  Recommend aspirin and Brilinta long-term since patient is already on aspirin and Plavix for his significant concurrent coronary artery disease.  Aggressive risk factor modification.  Patient counseled to compliant with her diabetic medications and follow-up with endocrinologist at [facility].  Greater than 50% time during this 50-minute visit was spent on counseling and coordination of care about her stroke and multivessel intra atherosclerosis and discussion with patient, husband and Dr. Cyndia Skeeters and answering questions  Antony Contras, MD Medical Director [place] Pager: [phone] 09/09/2021 3:23 PM  To  contact Stroke Continuity provider, please refer to [email or web address] After hours, contact General Neurology

## 2021-09-09 NOTE — Evaluation (Signed)
Physical Therapy Evaluation Patient Details Name: Morgan [name] MRN: [id number] DOB: [birth year] Today's Date: 09/09/2021  History of Present Illness  Pt is a 51 y/o female admitted secondary to L hand/arm weakness. Found to have infarct in R external capsule and R periventricular frontal lobe. PMH includes recent cardiac stent placement, ESRD s/p transplant, bilateral retinal detachment, HTN, and CVA.  Clinical Impression  Pt admitted secondary to problem above with deficits below. Pt requiring min guard to min A for transfers and gait this session. Increased dizziness noted with horizontal and vertical head turns and required seated rest during gait. Feel pt would benefit from outpatient PT when cleared to participate from cardiac perspective. Will continue to follow acutely.        Recommendations for follow up therapy are one component of a multi-disciplinary discharge planning process, led by the attending physician.  Recommendations may be updated based on patient status, additional functional criteria and insurance authorization.  Follow Up Recommendations Outpatient PT (once cleared from cardiac perspective)      Assistance Recommended at Discharge Intermittent Supervision/Assistance  Patient can return home with the following  Help with stairs or ramp for entrance;Assist for transportation;Assistance with cooking/housework    Equipment Recommendations Other (comment) (TBD)  Recommendations for Other Services       Functional Status Assessment Patient has had a recent decline in their functional status and demonstrates the ability to make significant improvements in function in a reasonable and predictable amount of time.     Precautions / Restrictions Precautions Precautions: Fall Restrictions Weight Bearing Restrictions: No      Mobility  Bed Mobility Overal bed mobility: Modified Independent             General bed mobility comments: slow due to  changes in postion can affect dizziness but reported this happened at PLOF    Transfers Overall transfer level: Needs assistance Equipment used: None Transfers: Sit to/from Stand Sit to Stand: Min guard           General transfer comment: Min guard for safety.    Ambulation/Gait Ambulation/Gait assistance: Min guard, Min assist Gait Distance (Feet): 50 Feet Assistive device: None Gait Pattern/deviations: Step-through pattern, Decreased stride length Gait velocity: Decreased     General Gait Details: Performed horizontal and vertical head turns and noted increased unsteadiness and dizziness. Required seated rest in middle of gait secondary to dizziness. Min guard up to min A for steadying.  Stairs            Wheelchair Mobility    Modified Rankin (Stroke Patients Only)       Balance Overall balance assessment: Needs assistance Sitting-balance support: Feet supported Sitting balance-Leahy Scale: Good     Standing balance support: No upper extremity supported Standing balance-Leahy Scale: Fair                               Pertinent Vitals/Pain Pain Assessment Pain Assessment: 0-10 Pain Score: 1  Pain Location: R hand due to PMH of decrease in sensation Pain Descriptors / Indicators: Tingling, Tightness Pain Intervention(s): Limited activity within patient's tolerance, Monitored during session, Repositioned    Home Living Family/patient expects to be discharged to:: Private residence Living Arrangements: Spouse/significant other Available Help at Discharge: Family;Available PRN/intermittently Type of Home: House Home Access: Stairs to enter Entrance Stairs-Rails: None Entrance Stairs-Number of Steps: 2   Home Layout: Two level;Able to live on main level with bedroom/bathroom Home  Equipment: None Additional Comments: husband works 1-10p, 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 Prior Level of Function : Independent/Modified  Independent;Driving;Working/employed             Mobility Comments: Pt reported some limited abilities due to recent stent placements ADLs Comments: assist with bra due to decrease in sensation     Hand Dominance   Dominant Hand: Left    Extremity/Trunk Assessment   Upper Extremity Assessment Upper Extremity Assessment: Defer to OT evaluation RUE Deficits / Details: Pt has hx of decrease in sensation to touch, cold/hot, decrease in coordination and strength RUE Sensation: decreased proprioception;decreased light touch RUE Coordination: decreased fine motor LUE Deficits / Details: Pt reported not back to baseline but much better then it was when signs first came up LUE Sensation: decreased light touch;decreased proprioception LUE Coordination: decreased fine motor    Lower Extremity Assessment Lower Extremity Assessment: Generalized weakness    Cervical / Trunk Assessment Cervical / Trunk Assessment: Normal  Communication   Communication: No difficulties  Cognition Arousal/Alertness: Awake/alert Behavior During Therapy: WFL for tasks assessed/performed Overall Cognitive Status: Within Functional Limits for tasks assessed                                 General Comments: Pt noted slow to answer        General Comments General comments (skin integrity, edema, etc.): Pt's husband present    Exercises     Assessment/Plan    PT Assessment Patient needs continued PT services  PT Problem List Decreased strength;Decreased balance;Decreased activity tolerance;Decreased mobility;Decreased knowledge of use of DME;Decreased knowledge of precautions       PT Treatment Interventions DME instruction;Gait training;Functional mobility training;Stair training;Therapeutic activities;Therapeutic exercise;Balance training;Patient/family education    PT Goals (Current goals can be found in the Care Plan section)  Acute Rehab PT Goals Patient Stated Goal: to go  home PT Goal Formulation: With patient Time For Goal Achievement: 09/23/21 Potential to Achieve Goals: Good    Frequency Min 4X/week     Co-evaluation   Reason for Co-Treatment: Complexity of the patient's impairments (multi-system involvement)   OT goals addressed during session: ADL's and self-care       AM-PAC PT "6 Clicks" Mobility  Outcome Measure Help needed turning from your back to your side while in a flat bed without using bedrails?: None Help needed moving from lying on your back to sitting on the side of a flat bed without using bedrails?: None Help needed moving to and from a bed to a chair (including a wheelchair)?: A Little Help needed standing up from a chair using your arms (e.g., wheelchair or bedside chair)?: A Little Help needed to walk in hospital room?: A Little Help needed climbing 3-5 steps with a railing? : A Little 6 Click Score: 20    End of Session Equipment Utilized During Treatment: Gait belt Activity Tolerance: Treatment limited secondary to medical complications (Comment) (dizziness) Patient left: in bed;with call bell/phone within reach Nurse Communication: Mobility status PT Visit Diagnosis: Unsteadiness on feet (R26.81);Muscle weakness (generalized) (M62.81)    Time: 4696-2952 PT Time Calculation (min) (ACUTE ONLY): 16 min   Charges:   PT Evaluation $PT Eval Low Complexity: 1 Low          Reuel Derby, PT, DPT  Acute Rehabilitation Services  Office: [phone]   Rudean Hitt 09/09/2021, 10:50 AM

## 2021-09-09 NOTE — Progress Notes (Signed)
VASCULAR LAB    Carotid duplex has been performed.  See CV proc for preliminary results.   Kirandeep Fariss, RVT 09/09/2021, 10:25 AM

## 2021-09-09 NOTE — Progress Notes (Signed)
  Echocardiogram 2D Echocardiogram has been performed.  Merrie Roof F 09/09/2021, 11:09 AM

## 2021-09-09 NOTE — Discharge Instructions (Signed)
Glucose Products:  ReliOnT glucose products raise low blood sugar fast. Tablets are free of fat, caffeine, sodium and gluten. They are portable and easy to carry, making it easier for people with diabetes to BE PREPARED for lows.  Glucose Tablets Available in 6 flavors  10 ct...................................... $1.00  50 ct...................................... $3.98 Glucose Shot..................................$1.48 Glucose Gel....................................$3.44  Alcohol Swabs Alcohol swabs are used to sterilize your injection site. All of our swabs are individually wrapped for maximum safety, convenience and moisture retention. ReliOnT Alcohol Swabs  100 ct Swabs..............................$1.00  400 ct Swabs..............................$3.74  Lancets ReliOnT offers three lancet options conveniently designed to work with almost every lancing device. Each features a protective disk, which guarantees sterility before testing. ReliOnT Lancets  100 ct Lancets $1.56  200 ct Lancets $2.64 Available in Ultra-Thin, Thin & Micro-Thin ReliOnT 2-IN-1 Lancing Device  50 ct Lancets..................................... $3.44 Available in 30 gauge and 25 gauge ReliOnT Lancing Device....................$5.84  Blood Glucose Monitors ReliOnT offers a full range of blood glucose testing options to provide an accurate, affordable system that meets each person's unique needs and preferences. Prime Meter....................................... $9.00 Prime Test Strips  25 test strips.................................... $5.00  50 test strips.................................... $9.00  100 test strips.................................$17.88 Premier BLU Meter  ............  $18.98 Premier Voice Meter  .............  $14.98 Premier Test Strips  50 test strips.................................... $9.00  100 test strips.................................$17.88 Premier [employer]   ............  $19.44 Kit includes:  50 test strips  10 lancets  Lancing device  Carry case  Ketone Test Strips  50 test strips  ................  $6.64  Human Insulin  Novolin/ReliOnT (recombinant DNA origin) is manufactured for [employer] by [employer] Insulin* with Vial..........$24.88 You would need the NPH insulin (with the same dose and frequency of Tresiba) Novolin/ReliOnT Insulin Pens*  .....  $42.88 You need the NPH insulin (with the same dose and frequency of Tresiba)  Insulin Delivery ReliOnT syringes and pen needles provide precision technology, comfort and accuracy in insulin delivery at affordable prices. ReliOnT Pen Needles*  50 ct....................................................$9.00 Available in 69m, 639m 67m52m 33m367mliOnT Insulin Syringes*  100 ct ............ $12.58 Available in 29G, 30G & 31G (3/10cc, 1/2cc & 1cc units)

## 2021-09-09 NOTE — Inpatient Diabetes Management (Signed)
Inpatient Diabetes Program Recommendations  AACE/ADA: New Consensus Statement on Inpatient Glycemic Control (2015)  Target Ranges:  Prepandial:   less than 140 mg/dL      Peak postprandial:   less than 180 mg/dL (1-2 hours)      Critically ill patients:  140 - 180 mg/dL   Lab Results  Component Value Date   GLUCAP 263 (H) 09/09/2021   HGBA1C 12.4 (H) 09/09/2021    Review of Glycemic Control CVA Diabetes history: DM 2 Outpatient Diabetes medications: Tresiba 32 units bid, Novolog 12 units tid, Ozempic 1 mg weekly Current orders for Inpatient glycemic control:  Semglee 20 units Novolog 0-15 units tid + hs Novolog 3 units tid meal coverage  A1c 12.4 on 7/15  Unable to get medications for 2 weeks due to increased cost of co pays  BCBS  Inpatient Diabetes Program Recommendations:    Spoke with pt at bedside regarding A1c of 12.4% and glucose control at home. Pt reports she has all medications except for her Tresiba insulin. When she tried to get it filled her copays changed to be >$400. Pt realized she needed to get it switched, she had left a message through the portal to her doctor to get it switched and has not heard back from them. That is when she came into the hospital for a stroke.  -  Pt needs benefits check for basal insulins preferred by BCBS  Will attach [place] list to pt AVS for back up insulin. Pt would need the NPH 32 units bid in place of Tresiba   Thanks, Tama Headings RN, MSN, BC-ADM Inpatient Diabetes Coordinator Team Pager [phone] (8a-5p)

## 2021-09-10 DIAGNOSIS — D849 Immunodeficiency, unspecified: Secondary | ICD-10-CM | POA: Diagnosis not present

## 2021-09-10 DIAGNOSIS — I639 Cerebral infarction, unspecified: Secondary | ICD-10-CM | POA: Diagnosis not present

## 2021-09-10 DIAGNOSIS — N186 End stage renal disease: Secondary | ICD-10-CM | POA: Diagnosis not present

## 2021-09-10 DIAGNOSIS — E1122 Type 2 diabetes mellitus with diabetic chronic kidney disease: Secondary | ICD-10-CM | POA: Diagnosis not present

## 2021-09-10 LAB — GLUCOSE, CAPILLARY
Glucose-Capillary: 163 mg/dL — ABNORMAL HIGH (ref 70–99)
Glucose-Capillary: 322 mg/dL — ABNORMAL HIGH (ref 70–99)
Glucose-Capillary: 284 mg/dL — ABNORMAL HIGH (ref 70–99)
Glucose-Capillary: 317 mg/dL — ABNORMAL HIGH (ref 70–99)
Glucose-Capillary: 273 mg/dL — ABNORMAL HIGH (ref 70–99)

## 2021-09-10 LAB — HIV ANTIBODY (ROUTINE TESTING W REFLEX): HIV Screen 4th Generation wRfx: NONREACTIVE
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
## 2021-09-10 NOTE — Inpatient Diabetes Management (Signed)
Inpatient Diabetes Program Recommendations  AACE/ADA: New Consensus Statement on Inpatient Glycemic Control (2015)  Target Ranges:  Prepandial:   less than 140 mg/dL      Peak postprandial:   less than 180 mg/dL (1-2 hours)      Critically ill patients:  140 - 180 mg/dL   Lab Results  Component Value Date   GLUCAP 163 (H) 09/10/2021   HGBA1C 12.4 (H) 09/09/2021    Review of Glycemic Control  Latest Reference Range & Units 09/09/21 06:02 09/09/21 12:43 09/09/21 16:30 09/09/21 21:48 09/10/21 06:09  Glucose-Capillary 70 - 99 mg/dL 193 (H) 263 (H) 289 (H) 217 (H) 163 (H)   CVA Diabetes history: DM 2 Outpatient Diabetes medications: Tresiba 32 units bid, Novolog 12 units tid, Ozempic 1 mg weekly Current orders for Inpatient glycemic control:  Semglee 20 units Novolog 0-15 units tid + hs Novolog 3 units tid meal coverage  A1c 12.4 on 7/15  Unable to get medications for 2 weeks due to increased cost of co pays  BCBS  Inpatient Diabetes Program Recommendations:    -  Consider increasing Novolog meal coverage to 5 units tid.  -  Pt needs benefits check for basal insulins preferred by BCBS  Will attach [place] list to pt AVS for back up insulin. Pt would need the NPH 32 units bid in place of Tresiba   Thanks, Tama Headings RN, MSN, BC-ADM Inpatient Diabetes Coordinator Team Pager [phone] (8a-5p)

## 2021-09-10 NOTE — Progress Notes (Signed)
PROGRESS NOTE  Morgan [name] HAL:[id number] DOB: 03-Dec-1970   PCP: Awanda Mink, MD  Patient is from: Home.  Lives with husband.  Independent at baseline.  DOA: 09/08/2021 LOS: 2  Chief complaints Chief Complaint  Patient presents with   Hand Pain   Code Stroke     Brief Narrative / Interim history:  51 year old F with PMH of CKD s/p transplant on immunosuppressants, IDDM-2, CAD s/p stent on 6/29, PAD, CVA, HTN and HLD presenting with decreased left arm coordination and heaviness while eating brunch, postprandial nausea, polyuria and polydipsia for weeks.  Patient reports running out of [place].  Initially, she reported not taking her Plavix for 2 weeks but she later reports taking her Plavix and low-dose aspirin until the day of presentation.  She also reports running out of her immunosuppressants for 3 days  In ED, code stroke called.  CT head without contrast negative but MRI did show 2 punctate acute infarcts in the right external capsule and right periventricular frontal lobe.  She was also noted to be hyperglycemic but not in DKA or HHS.  She had AKI with Cr 2.05.   The next day, MRA head with severe but stable intracranial atherosclerosis without LVO.  Carotid ultrasound without significant finding.  TTE ordered and pending.  Neurology following.  Subjective:  No significant events overnight, she denies any dyspnea, chest pain or shortness of breath, no new focal deficits.  Objective: Vitals:   09/09/21 2026 09/09/21 2331 09/10/21 0331 09/10/21 0851  BP: (!) 93/43 (!) 135/49 (!) 101/47 (!) 118/42  Pulse: 96 96 92 92  Resp: 20 20 20 18   Temp: 98.2 F (36.8 C) 98 F (36.7 C) 98.2 F (36.8 C) 98.4 F (36.9 C)  TempSrc: Oral Oral Oral Oral  SpO2: 98% 100% 100% 98%  Weight:      Height:        Examination:  Awake Alert, Oriented X 3, No new F.N deficits, Normal affect Symmetrical Chest wall movement, Good air movement bilaterally, CTAB RRR,No  Gallops,Rubs or new Murmurs, No Parasternal Heave +ve B.Sounds, Abd Soft, No tenderness, No rebound - guarding or rigidity. No Cyanosis, Clubbing or edema, No new Rash or bruise     Procedures:  None  Microbiology summarized: COVID-19 and influenza PCR nonreactive.  Assessment and plan: Principal Problem:   Acute CVA (cerebrovascular accident) ([place]) Active Problems:   S/p cadaver renal transplant CKD stage IIIa   Diabetes mellitus, type 2 ([place])   Essential (primary) hypertension   Hyperlipidemia   Immunosuppression ([place])   Peripheral vascular disease, unspecified ([place])   Acute right MCA CVA involving right external capsule and right periventricular frontal lobe:  - Noted on MRI brain although I am not sure if this fully explains her symptoms specially her left hand paresthesia.  MRA with severe but stable intracranial stenosis involving ACA and PCA but no LVO.Marland Kitchen  Carotid US without significant stenosis.  Patient has significant risk factors with poorly controlled diabetes, previous CVA, HTN and hyperlipidemia.  She was out of her meds including insulin, Plavix and transplant meds.  A1c 12.4%.  LDL 48. -TTE with EF 50 to 55%, -Neurology following -Neurology started Brilinta instead of home Plavix.  -Permissive hypertension for 2 to 3 days and then SBP of about 130 -Follow-up PT/OT/SLP eval -TOC consulted for medication assistance  Intracranial arterial stenosis:  - MRA as above. -Optimize diabetic control and compliance with medication  Uncontrolled IDDM-2 with hyperglycemia: A1c 12.4% (10.2% about 4 months  ago).  Reports not affording her insulin due to co-pays. Recent Labs  Lab 09/09/21 0602 09/09/21 1243 09/09/21 1630 09/09/21 2148 09/10/21 0609  GLUCAP 193* 263* 289* 217* 163*  -Increase SSI to moderate -Add NovoLog 3 units 3 times daily with meals -Continue Semglee 20 units at night -Sent Rx for Basaglar insulin to check on her copy -Further adjustment as  appropriate. -Consult diabetic coronary and TOC   S/p cadaver renal transplant on immunosuppression: out of her medications for 3 days AKI on CKD-3A: Improved. Recent Labs    05/12/21 1012 09/08/21 1436 09/08/21 1543 09/09/21 0251  BUN 39* 38* 38* 33*  CREATININE 1.39* 1.90* 2.05* 1.52*  -Resume home transplant meds. -Continue monitoring  CAD s/p stents on 08/24/2021 at [place]:  out of Plavix for 2 weeks?  No angina symptoms. -Now on Brilinta per neurology -Continue home statin  Essential hypertension: Normotensive. -Hold home antihypertensive meds  Mild Hyperkalemia: Resolved.  Mood disorder: -Continue home Plavix but not a great choice if not compliant  Morbid obesity Body mass index is 35.3 kg/m.          DVT prophylaxis:  enoxaparin (LOVENOX) injection 40 mg Start: 09/08/21 2200  Code Status: Full code Family Communication: None at bedside Level of care: Telemetry Medical Status is: Inpatient Remains inpatient appropriate because: Acute right MCA CVA   Final disposition: Home once medically cleared Consultants:  Neurology  Sch Meds:  Scheduled Meds:  aspirin  81 mg Oral Daily   enoxaparin (LOVENOX) injection  40 mg Subcutaneous F02D   folic acid  1 mg Oral QPM   insulin aspart  0-15 Units Subcutaneous TID WC   insulin aspart  0-5 Units Subcutaneous QHS   insulin aspart  3 Units Subcutaneous TID WC   insulin glargine-yfgn  20 Units Subcutaneous QHS   mycophenolate  250 mg Oral QHS   mycophenolate  500 mg Oral Daily   PARoxetine  10 mg Oral Daily   polyethylene glycol  17 g Oral Daily   predniSONE  5 mg Oral Q breakfast   rosuvastatin  20 mg Oral Daily   sodium bicarbonate  1,300 mg Oral BID   tacrolimus  4 mg Oral BID   ticagrelor  90 mg Oral BID   Continuous Infusions: PRN Meds:.acetaminophen **OR** acetaminophen (TYLENOL) oral liquid 160 mg/5 mL **OR** acetaminophen, HYDROcodone-acetaminophen,  senna-docusate  Antimicrobials: Anti-infectives (From admission, onward)    None        I have personally reviewed the following labs and images: CBC: Recent Labs  Lab 09/08/21 1426 09/08/21 1436 09/09/21 0251  WBC 7.3  --  6.9  NEUTROABS 4.6  --   --   HGB 16.4* 17.0* 15.8*  HCT 48.6* 50.0* 45.8  MCV 81.4  --  80.4  PLT 297  --  301   BMP &GFR Recent Labs  Lab 09/08/21 1436 09/08/21 1543 09/09/21 0251  NA 128* 132* 138  K 4.7 5.2* 3.6  CL 99 95* 105  CO2  --  21* 22  GLUCOSE 687* 527* 164*  BUN 38* 38* 33*  CREATININE 1.90* 2.05* 1.52*  CALCIUM  --  10.5* 9.4  MG  --   --  2.0  PHOS  --   --  2.7   Estimated Creatinine Clearance: 45 mL/min (A) (by C-G formula based on SCr of 1.52 mg/dL (H)). Liver & Pancreas: Recent Labs  Lab 09/08/21 1543 09/09/21 0251  AST 29  --   ALT 24  --  ALKPHOS 100  --   BILITOT 1.0  --   PROT 6.8  --   ALBUMIN 3.6 3.1*   No results for input(s): "LIPASE", "AMYLASE" in the last 168 hours. No results for input(s): "AMMONIA" in the last 168 hours. Diabetic: Recent Labs    09/09/21 0251  HGBA1C 12.4*   Recent Labs  Lab 09/09/21 0602 09/09/21 1243 09/09/21 1630 09/09/21 2148 09/10/21 0609  GLUCAP 193* 263* 289* 217* 163*   Cardiac Enzymes: Recent Labs  Lab 09/09/21 0251  CKTOTAL 96   No results for input(s): "PROBNP" in the last 8760 hours. Coagulation Profile: Recent Labs  Lab 09/08/21 1426  INR 1.0   Thyroid Function Tests: No results for input(s): "TSH", "T4TOTAL", "FREET4", "T3FREE", "THYROIDAB" in the last 72 hours. Lipid Profile: Recent Labs    09/09/21 0251  CHOL 119  HDL 34*  LDLCALC 48  TRIG 183*  CHOLHDL 3.5   Anemia Panel: No results for input(s): "VITAMINB12", "FOLATE", "FERRITIN", "TIBC", "IRON", "RETICCTPCT" in the last 72 hours. Urine analysis:    Component Value Date/Time   COLORURINE ORANGE (A) 09/22/2014 1415   APPEARANCEUR CLOUDY (A) 09/22/2014 1415   LABSPEC 1.014  09/22/2014 1415   PHURINE 8.0 09/22/2014 1415   GLUCOSEU 500 (A) 09/22/2014 1415   HGBUR LARGE (A) 09/22/2014 1415   BILIRUBINUR neg 10/06/2014 1359   KETONESUR 15 (A) 09/22/2014 1415   PROTEINUR 100 10/06/2014 1359   PROTEINUR 100 (A) 09/22/2014 1415   UROBILINOGEN 0.2 10/06/2014 1359   UROBILINOGEN 0.2 09/22/2014 1415   NITRITE neg 10/06/2014 1359   NITRITE NEGATIVE 09/22/2014 1415   LEUKOCYTESUR Negative 10/06/2014 1359   Sepsis Labs: Invalid input(s): "PROCALCITONIN", "LACTICIDVEN"  Microbiology: Recent Results (from the past 240 hour(s))  Resp Panel by RT-PCR (Flu A&B, Covid) Anterior Nasal Swab     Status: None   Collection Time: 09/08/21  2:26 PM   Specimen: Anterior Nasal Swab  Result Value Ref Range Status   SARS Coronavirus 2 by RT PCR NEGATIVE NEGATIVE Final    Comment: (NOTE) SARS-CoV-2 target nucleic acids are NOT DETECTED.  The SARS-CoV-2 RNA is generally detectable in upper respiratory specimens during the acute phase of infection. The lowest concentration of SARS-CoV-2 viral copies this assay can detect is 138 copies/mL. A negative result does not preclude SARS-Cov-2 infection and should not be used as the sole basis for treatment or other patient management decisions. A negative result may occur with  improper specimen collection/handling, submission of specimen other than nasopharyngeal swab, presence of viral mutation(s) within the areas targeted by this assay, and inadequate number of viral copies(<138 copies/mL). A negative result must be combined with clinical observations, patient history, and epidemiological information. The expected result is Negative.  Fact Sheet for Patients:  EntrepreneurPulse.com.au  Fact Sheet for Healthcare Providers:  IncredibleEmployment.be  This test is no t yet approved or cleared by the [place] FDA and  has been authorized for detection and/or diagnosis of SARS-CoV-2 by FDA  under an Emergency Use Authorization (EUA). This EUA will remain  in effect (meaning this test can be used) for the duration of the COVID-19 declaration under Section 564(b)(1) of the Act, 21 U.S.C.section 360bbb-3(b)(1), unless the authorization is terminated  or revoked sooner.       Influenza A by PCR NEGATIVE NEGATIVE Final   Influenza B by PCR NEGATIVE NEGATIVE Final    Comment: (NOTE) The Xpert Xpress SARS-CoV-2/FLU/RSV plus assay is intended as an aid in the diagnosis of influenza from Nasopharyngeal  swab specimens and should not be used as a sole basis for treatment. Nasal washings and aspirates are unacceptable for Xpert Xpress SARS-CoV-2/FLU/RSV testing.  Fact Sheet for Patients: EntrepreneurPulse.com.au  Fact Sheet for Healthcare Providers: IncredibleEmployment.be  This test is not yet approved or cleared by the [place] FDA and has been authorized for detection and/or diagnosis of SARS-CoV-2 by FDA under an Emergency Use Authorization (EUA). This EUA will remain in effect (meaning this test can be used) for the duration of the COVID-19 declaration under Section 564(b)(1) of the Act, 21 U.S.C. section 360bbb-3(b)(1), unless the authorization is terminated or revoked.  Performed at [facility], [place] [address], [place], [place] [address]     Radiology Studies: ECHOCARDIOGRAM COMPLETE  Result Date: 09/09/2021    ECHOCARDIOGRAM REPORT   Patient Name:   MARQUISHA NIKOLOV Date of Exam: 09/09/2021 Medical Rec #:  [id number]                Height:       62.0 in Accession #:    [id number]               Weight:       193.0 lb Date of Birth:  01-09-1971                BSA:          1.883 m Patient Age:    14 years                 BP:           109/75 mmHg Patient Gender: F                        HR:           90 bpm. Exam Location:  Inpatient Procedure: 2D Echo, Cardiac Doppler, Color Doppler and 3D Echo Indications:    Stroke   History:        Patient has prior history of Echocardiogram examinations, most                 recent 05/13/2021. CAD; Stroke.  Sonographer:    Merrie Roof RDCS Referring Phys: [place]  1. Left ventricular ejection fraction, by estimation, is 50 to 55%. The left ventricle has low normal function. The left ventricle demonstrates regional wall motion abnormalities (see scoring diagram/findings for description). There is mild left ventricular hypertrophy. Left ventricular diastolic parameters are indeterminate. There is moderate hypokinesis of the left ventricular, basal inferior wall.  2. Right ventricular systolic function is normal. The right ventricular size is normal.  3. Left atrial size was mild to moderately dilated.  4. The mitral valve is abnormal. Trivial mitral valve regurgitation. No evidence of mitral stenosis. Moderate mitral annular calcification.  5. The aortic valve was not well visualized. Aortic valve regurgitation is not visualized. No aortic stenosis is present.  6. The inferior vena cava is normal in size with greater than 50% respiratory variability, suggesting right atrial pressure of 3 mmHg. Comparison(s): No significant change from prior study. Conclusion(s)/Recommendation(s): No intracardiac source of embolism detected on this transthoracic study. Consider a transesophageal echocardiogram to exclude cardiac source of embolism if clinically indicated. FINDINGS  Left Ventricle: Left ventricular ejection fraction, by estimation, is 50 to 55%. The left ventricle has low normal function. The left ventricle demonstrates regional wall motion abnormalities. Moderate hypokinesis of the left ventricular, basal inferior  wall. 3D left  ventricular ejection fraction analysis performed but not reported based on interpreter judgement due to suboptimal tracking. The left ventricular internal cavity size was normal in size. There is mild left ventricular hypertrophy. Left ventricular  diastolic parameters are indeterminate. Right Ventricle: The right ventricular size is normal. Right vetricular wall thickness was not well visualized. Right ventricular systolic function is normal. Left Atrium: Left atrial size was mild to moderately dilated. Right Atrium: Right atrial size was normal in size. Pericardium: There is no evidence of pericardial effusion. Mitral Valve: The mitral valve is abnormal. There is mild thickening of the mitral valve leaflet(s). There is mild calcification of the mitral valve leaflet(s). Moderate mitral annular calcification. Trivial mitral valve regurgitation. No evidence of mitral valve stenosis. Tricuspid Valve: The tricuspid valve is normal in structure. Tricuspid valve regurgitation is trivial. No evidence of tricuspid stenosis. Aortic Valve: The aortic valve was not well visualized. Aortic valve regurgitation is not visualized. No aortic stenosis is present. Aortic valve mean gradient measures 3.0 mmHg. Aortic valve peak gradient measures 5.4 mmHg. Aortic valve area, by VTI measures 1.87 cm. Pulmonic Valve: The pulmonic valve was not well visualized. Pulmonic valve regurgitation is not visualized. Aorta: The aortic root, ascending aorta and aortic arch are all structurally normal, with no evidence of dilitation or obstruction. Venous: The inferior vena cava is normal in size with greater than 50% respiratory variability, suggesting right atrial pressure of 3 mmHg. IAS/Shunts: The interatrial septum was not well visualized.  LEFT VENTRICLE PLAX 2D LVIDd:         4.20 cm     Diastology LVIDs:         3.40 cm     LV e' medial:    6.53 cm/s LV PW:         1.20 cm     LV E/e' medial:  12.7 LV IVS:        1.10 cm     LV e' lateral:   3.15 cm/s LVOT diam:     1.70 cm     LV E/e' lateral: 26.3 LV SV:         42 LV SV Index:   22 LVOT Area:     2.27 cm                             3D Volume EF: LV Volumes (MOD)           3D EF:        47 % LV vol d, MOD A2C: 49.8 ml LV EDV:        83 ml LV vol d, MOD A4C: 67.4 ml LV ESV:       44 ml LV vol s, MOD A2C: 25.6 ml LV SV:        39 ml LV vol s, MOD A4C: 29.2 ml LV SV MOD A2C:     24.2 ml LV SV MOD A4C:     67.4 ml LV SV MOD BP:      32.1 ml RIGHT VENTRICLE RV Basal diam:  3.30 cm RV S prime:     14.70 cm/s TAPSE (M-mode): 2.3 cm LEFT ATRIUM             Index        RIGHT ATRIUM           Index LA diam:        4.00 cm 2.12 cm/m   RA Area:  13.50 cm LA Vol (A2C):   81.0 ml 43.02 ml/m  RA Volume:   31.40 ml  16.68 ml/m LA Vol (A4C):   36.2 ml 19.23 ml/m LA Biplane Vol: 56.4 ml 29.95 ml/m  AORTIC VALVE AV Area (Vmax):    1.76 cm AV Area (Vmean):   1.74 cm AV Area (VTI):     1.87 cm AV Vmax:           116.00 cm/s AV Vmean:          81.500 cm/s AV VTI:            0.222 m AV Peak Grad:      5.4 mmHg AV Mean Grad:      3.0 mmHg LVOT Vmax:         89.90 cm/s LVOT Vmean:        62.600 cm/s LVOT VTI:          0.183 m LVOT/AV VTI ratio: 0.82  AORTA Ao Root diam: 2.50 cm Ao Asc diam:  3.20 cm Ao Arch diam: 2.8 cm MITRAL VALVE MV Area (PHT): 4.60 cm     SHUNTS MV Decel Time: 165 msec     Systemic VTI:  0.18 m MV E velocity: 83.00 cm/s   Systemic Diam: 1.70 cm MV A velocity: 119.00 cm/s MV E/A ratio:  0.70 Buford Dresser MD Electronically signed by Buford Dresser MD Signature Date/Time: 09/09/2021/3:06:59 PM    Final    VAS US CAROTID  Result Date: 09/09/2021 Carotid Arterial Duplex Study Patient Name:  BRENYA TAULBEE  Date of Exam:   09/09/2021 Medical Rec #: [id number]                 Accession #:    [id number] Date of Birth: Aug 03, 1970                 Patient Gender: F Patient Age:   83 years Exam Location:  [facility] Procedure:      VAS US CAROTID Referring Phys: Bretta Bang GONFA --------------------------------------------------------------------------------  Indications:       CVA and Lack of coordination with left arm, visual                    disturbance. Risk Factors:      Hypertension, hyperlipidemia, Diabetes,  prior CVA. Other Factors:     Severe Intracranial Atherosclerosis                    Multifocal high-grade anterior and posterior                    circulation stenoses, most notable for:                    - Severe mid Basilar stenosis.                    - Severe bilateral ACA origin and A2 stenoses.                    - Severe right greater than left bilateral PCA stenoses. Comparison Study:  No prior study Performing Technologist: Sharion Dove RVS  Examination Guidelines: A complete evaluation includes B-mode imaging, spectral Doppler, color Doppler, and power Doppler as needed of all accessible portions of each vessel. Bilateral testing is considered an integral part of a complete examination. Limited examinations for reoccurring indications may be performed as noted.  Right Carotid Findings: +----------+--------+--------+--------+------------------+--------+  PSV cm/sEDV cm/sStenosisPlaque DescriptionComments +----------+--------+--------+--------+------------------+--------+ CCA Prox  82      20              homogeneous                +----------+--------+--------+--------+------------------+--------+ CCA Distal83      11              homogeneous                +----------+--------+--------+--------+------------------+--------+ ICA Prox  74      15              heterogenous               +----------+--------+--------+--------+------------------+--------+ ICA Mid   71      19                                         +----------+--------+--------+--------+------------------+--------+ ICA Distal78      17                                         +----------+--------+--------+--------+------------------+--------+ ECA       132     2                                          +----------+--------+--------+--------+------------------+--------+ +----------+--------+-------+--------+-------------------+           PSV cm/sEDV cmsDescribeArm Pressure (mmHG)  +----------+--------+-------+--------+-------------------+ Subclavian210                                        +----------+--------+-------+--------+-------------------+ +---------+--------+--+--------+-+ VertebralPSV cm/s55EDV cm/s5 +---------+--------+--+--------+-+  Left Carotid Findings: +----------+--------+--------+--------+------------------+--------+           PSV cm/sEDV cm/sStenosisPlaque DescriptionComments +----------+--------+--------+--------+------------------+--------+ CCA Prox  70      15              homogeneous                +----------+--------+--------+--------+------------------+--------+ CCA Distal67      11              homogeneous                +----------+--------+--------+--------+------------------+--------+ ICA Prox  86      17              heterogenous               +----------+--------+--------+--------+------------------+--------+ ICA Mid   73      20                                         +----------+--------+--------+--------+------------------+--------+ ICA Distal71      21                                         +----------+--------+--------+--------+------------------+--------+ ECA       144     9                                          +----------+--------+--------+--------+------------------+--------+ +----------+--------+--------+--------+-------------------+  PSV cm/sEDV cm/sDescribeArm Pressure (mmHG) +----------+--------+--------+--------+-------------------+ [id number]                                         +----------+--------+--------+--------+-------------------+ +---------+--------+--+--------+--+ VertebralPSV cm/s72EDV cm/s11 +---------+--------+--+--------+--+   Summary: Right Carotid: Velocities in the right ICA are consistent with a 1-39% stenosis. Left Carotid: Velocities in the left ICA are consistent with a 1-39% stenosis. Vertebrals:  Bilateral vertebral arteries  demonstrate antegrade flow. Subclavians: Normal flow hemodynamics were seen in bilateral subclavian              arteries. *See table(s) above for measurements and observations.  Electronically signed by Antony Contras MD on 09/09/2021 at 11:39:15 AM.    Final    MR ANGIO HEAD WO CONTRAST  Result Date: 09/09/2021 CLINICAL DATA:  51 year old female with neurologic deficit. MRI suggesting acute small vessel infarcts in the right frontal lobe white matter yesterday. EXAM: MRA HEAD WITHOUT CONTRAST TECHNIQUE: Angiographic images of the Circle of Willis were acquired using MRA technique without intravenous contrast. COMPARISON:  Brain MRI 09/08/2021.  Intracranial MRA 05/12/2021. FINDINGS: Anterior circulation: Antegrade flow signal in both ICA siphons appears stable since March. Mild siphon irregularity in keeping with atherosclerosis but no hemodynamically significant siphon stenosis identified. Bilateral ophthalmic and posterior communicating artery origins remain patent. Stable carotid termini with mild left MCA origin but moderate to severe left greater than right ACA origin stenoses (series 252, image 12). Both A1 segments remain patent. However, there is severe bilateral distal A2 segment stenosis, stable (series 253, image 1). Additional fronto polar branch stenosis also appears chronic. Right MCA M1 segment and bifurcation are patent without stenosis. Mild to moderate posterior right M2 branch irregularity and stenosis appears stable. Left MCA M1 irregularity and stenosis appears mild. Patent left MCA bifurcation. Moderate and occasionally severe left MCA branch irregularity and stenosis appears chronic and stable (series 252, image 17). Posterior circulation: Tenuous antegrade flow in the posterior circulation does not appear significantly changed from the March CTA. Diminutive and stenotic basilar artery, with segmental flow gaps but maintained flow signal at the basilar tip in the setting of fetal type  bilateral PCA origins. SCA origins remain patent. Diminutive distal vertebral arteries appear patent without significant stenosis. Both PICA origins remain patent. Moderate to severe bilateral posterior communicating arteries stenoses at the junctions with the P2 segments. Additional severe right PCA P2 segment stenosis (series 255, image 15). Attenuated bilateral distal PCA branch flow signal. These findings are stable since March. Anatomic variants: Fetal type bilateral PCA origins. Other: No intracranial mass effect. IMPRESSION: 1. Stable MRI appearance of Severe Intracranial Atherosclerosis since March. Multifocal high-grade anterior and posterior circulation stenoses, most notable for: - Severe mid Basilar stenosis. - Severe bilateral ACA origin and A2 stenoses. - Severe right greater than left bilateral PCA stenoses. 2. No large vessel occlusion. Electronically Signed   By: Genevie Ann M.D.   On: 09/09/2021 10:17      Phillips Climes MD [facility]  If 7PM-7AM, please contact night-coverage [email or web address] 09/10/2021, 9:39 AM

## 2021-09-10 NOTE — Care Management (Signed)
Spoke to patient at bedside regarding consult for running out of meds. She states she has already told this information to other providers when I review the reason for my consult and explain why I am asking her questions. She states the insurance company no longer covers [place]. She and husband have been in contact with the insurance company and the prescribing MD. CM offered to be of assistance, but she could not identify how I could help her. She denies needing help with any other medications.

## 2021-09-10 NOTE — Progress Notes (Addendum)
STROKE TEAM PROGRESS NOTE   INTERVAL HISTORY Husband is at bedside. Patient is awake and alert and oriented in NAD. Carotid dopplers negative. EF 50-55%. Neurological Exam unchanged VSS.   Vitals:   09/09/21 0334 09/09/21 2026 09/09/21 2331 09/10/21 0331  BP: 109/75 (!) 93/43 (!) 135/49 (!) 101/47  Pulse: 82 96 96 92  Resp: 18 20 20 20   Temp: 97.7 F (36.5 C) 98.2 F (36.8 C) 98 F (36.7 C) 98.2 F (36.8 C)  TempSrc: Oral Oral Oral Oral  SpO2: 100% 98% 100% 100%  Weight:      Height:       CBC:  Recent Labs  Lab 09/08/21 1426 09/08/21 1436 09/09/21 0251  WBC 7.3  --  6.9  NEUTROABS 4.6  --   --   HGB 16.4* 17.0* 15.8*  HCT 48.6* 50.0* 45.8  MCV 81.4  --  80.4  PLT 297  --  814    Basic Metabolic Panel:  Recent Labs  Lab 09/08/21 1543 09/09/21 0251  NA 132* 138  K 5.2* 3.6  CL 95* 105  CO2 21* 22  GLUCOSE 527* 164*  BUN 38* 33*  CREATININE 2.05* 1.52*  CALCIUM 10.5* 9.4  MG  --  2.0  PHOS  --  2.7    Lipid Panel:  Recent Labs  Lab 09/09/21 0251  CHOL 119  TRIG 183*  HDL 34*  CHOLHDL 3.5  VLDL 37  LDLCALC 48    HgbA1c:  Recent Labs  Lab 09/09/21 0251  HGBA1C 12.4*    Urine Drug Screen: No results for input(s): "LABOPIA", "COCAINSCRNUR", "LABBENZ", "AMPHETMU", "THCU", "LABBARB" in the last 168 hours.  Alcohol Level No results for input(s): "ETH" in the last 168 hours.  IMAGING past 24 hours ECHOCARDIOGRAM COMPLETE  Result Date: 09/09/2021    ECHOCARDIOGRAM REPORT   Patient Name:   Morgan [name] Date of Exam: 09/09/2021 Medical Rec #:  [id number]                Height:       62.0 in Accession #:    [id number]               Weight:       193.0 lb Date of Birth:  [birth year]                BSA:          1.883 m Patient Age:    51 years                 BP:           109/75 mmHg Patient Gender: F                        HR:           90 bpm. Exam Location:  Inpatient Procedure: 2D Echo, Cardiac Doppler, Color Doppler and 3D Echo Indications:     Stroke  History:        Patient has prior history of Echocardiogram examinations, most                 recent 05/13/2021. CAD; Stroke.  Sonographer:    Merrie Roof RDCS Referring Phys: [place]  1. Left ventricular ejection fraction, by estimation, is 50 to 55%. The left ventricle has low normal function. The left ventricle demonstrates regional wall motion abnormalities (see scoring diagram/findings for description). There is mild left ventricular  hypertrophy. Left ventricular diastolic parameters are indeterminate. There is moderate hypokinesis of the left ventricular, basal inferior wall.  2. Right ventricular systolic function is normal. The right ventricular size is normal.  3. Left atrial size was mild to moderately dilated.  4. The mitral valve is abnormal. Trivial mitral valve regurgitation. No evidence of mitral stenosis. Moderate mitral annular calcification.  5. The aortic valve was not well visualized. Aortic valve regurgitation is not visualized. No aortic stenosis is present.  6. The inferior vena cava is normal in size with greater than 50% respiratory variability, suggesting right atrial pressure of 3 mmHg. Comparison(s): No significant change from prior study. Conclusion(s)/Recommendation(s): No intracardiac source of embolism detected on this transthoracic study. Consider a transesophageal echocardiogram to exclude cardiac source of embolism if clinically indicated. FINDINGS  Left Ventricle: Left ventricular ejection fraction, by estimation, is 50 to 55%. The left ventricle has low normal function. The left ventricle demonstrates regional wall motion abnormalities. Moderate hypokinesis of the left ventricular, basal inferior  wall. 3D left ventricular ejection fraction analysis performed but not reported based on interpreter judgement due to suboptimal tracking. The left ventricular internal cavity size was normal in size. There is mild left ventricular hypertrophy. Left  ventricular diastolic parameters are indeterminate. Right Ventricle: The right ventricular size is normal. Right vetricular wall thickness was not well visualized. Right ventricular systolic function is normal. Left Atrium: Left atrial size was mild to moderately dilated. Right Atrium: Right atrial size was normal in size. Pericardium: There is no evidence of pericardial effusion. Mitral Valve: The mitral valve is abnormal. There is mild thickening of the mitral valve leaflet(s). There is mild calcification of the mitral valve leaflet(s). Moderate mitral annular calcification. Trivial mitral valve regurgitation. No evidence of mitral valve stenosis. Tricuspid Valve: The tricuspid valve is normal in structure. Tricuspid valve regurgitation is trivial. No evidence of tricuspid stenosis. Aortic Valve: The aortic valve was not well visualized. Aortic valve regurgitation is not visualized. No aortic stenosis is present. Aortic valve mean gradient measures 3.0 mmHg. Aortic valve peak gradient measures 5.4 mmHg. Aortic valve area, by VTI measures 1.87 cm. Pulmonic Valve: The pulmonic valve was not well visualized. Pulmonic valve regurgitation is not visualized. Aorta: The aortic root, ascending aorta and aortic arch are all structurally normal, with no evidence of dilitation or obstruction. Venous: The inferior vena cava is normal in size with greater than 50% respiratory variability, suggesting right atrial pressure of 3 mmHg. IAS/Shunts: The interatrial septum was not well visualized.  LEFT VENTRICLE PLAX 2D LVIDd:         4.20 cm     Diastology LVIDs:         3.40 cm     LV e' medial:    6.53 cm/s LV PW:         1.20 cm     LV E/e' medial:  12.7 LV IVS:        1.10 cm     LV e' lateral:   3.15 cm/s LVOT diam:     1.70 cm     LV E/e' lateral: 26.3 LV SV:         42 LV SV Index:   22 LVOT Area:     2.27 cm                             3D Volume EF: LV Volumes (MOD)  3D EF:        47 % LV vol d, MOD A2C: 49.8 ml  LV EDV:       83 ml LV vol d, MOD A4C: 67.4 ml LV ESV:       44 ml LV vol s, MOD A2C: 25.6 ml LV SV:        39 ml LV vol s, MOD A4C: 29.2 ml LV SV MOD A2C:     24.2 ml LV SV MOD A4C:     67.4 ml LV SV MOD BP:      32.1 ml RIGHT VENTRICLE RV Basal diam:  3.30 cm RV S prime:     14.70 cm/s TAPSE (M-mode): 2.3 cm LEFT ATRIUM             Index        RIGHT ATRIUM           Index LA diam:        4.00 cm 2.12 cm/m   RA Area:     13.50 cm LA Vol (A2C):   81.0 ml 43.02 ml/m  RA Volume:   31.40 ml  16.68 ml/m LA Vol (A4C):   36.2 ml 19.23 ml/m LA Biplane Vol: 56.4 ml 29.95 ml/m  AORTIC VALVE AV Area (Vmax):    1.76 cm AV Area (Vmean):   1.74 cm AV Area (VTI):     1.87 cm AV Vmax:           116.00 cm/s AV Vmean:          81.500 cm/s AV VTI:            0.222 m AV Peak Grad:      5.4 mmHg AV Mean Grad:      3.0 mmHg LVOT Vmax:         89.90 cm/s LVOT Vmean:        62.600 cm/s LVOT VTI:          0.183 m LVOT/AV VTI ratio: 0.82  AORTA Ao Root diam: 2.50 cm Ao Asc diam:  3.20 cm Ao Arch diam: 2.8 cm MITRAL VALVE MV Area (PHT): 4.60 cm     SHUNTS MV Decel Time: 165 msec     Systemic VTI:  0.18 m MV E velocity: 83.00 cm/s   Systemic Diam: 1.70 cm MV A velocity: 119.00 cm/s MV E/A ratio:  0.70 Buford Dresser MD Electronically signed by Buford Dresser MD Signature Date/Time: 09/09/2021/3:06:59 PM    Final    VAS US CAROTID  Result Date: 09/09/2021 Carotid Arterial Duplex Study Patient Name:  Morgan [name]  Date of Exam:   09/09/2021 Medical Rec #: [id number]                 Accession #:    [id number] Date of Birth: 10/20/1970                 Patient Gender: F Patient Age:   22 years Exam Location:  [facility] Procedure:      VAS US CAROTID Referring Phys: Bretta Bang GONFA --------------------------------------------------------------------------------  Indications:       CVA and Lack of coordination with left arm, visual                    disturbance. Risk Factors:      Hypertension,  hyperlipidemia, Diabetes, prior CVA. Other Factors:     Severe Intracranial Atherosclerosis  Multifocal high-grade anterior and posterior                    circulation stenoses, most notable for:                    - Severe mid Basilar stenosis.                    - Severe bilateral ACA origin and A2 stenoses.                    - Severe right greater than left bilateral PCA stenoses. Comparison Study:  No prior study Performing Technologist: Sharion Dove RVS  Examination Guidelines: A complete evaluation includes B-mode imaging, spectral Doppler, color Doppler, and power Doppler as needed of all accessible portions of each vessel. Bilateral testing is considered an integral part of a complete examination. Limited examinations for reoccurring indications may be performed as noted.  Right Carotid Findings: +----------+--------+--------+--------+------------------+--------+           PSV cm/sEDV cm/sStenosisPlaque DescriptionComments +----------+--------+--------+--------+------------------+--------+ CCA Prox  82      20              homogeneous                +----------+--------+--------+--------+------------------+--------+ CCA Distal83      11              homogeneous                +----------+--------+--------+--------+------------------+--------+ ICA Prox  74      15              heterogenous               +----------+--------+--------+--------+------------------+--------+ ICA Mid   71      19                                         +----------+--------+--------+--------+------------------+--------+ ICA Distal78      17                                         +----------+--------+--------+--------+------------------+--------+ ECA       132     2                                          +----------+--------+--------+--------+------------------+--------+ +----------+--------+-------+--------+-------------------+           PSV cm/sEDV  cmsDescribeArm Pressure (mmHG) +----------+--------+-------+--------+-------------------+ Subclavian210                                        +----------+--------+-------+--------+-------------------+ +---------+--------+--+--------+-+ VertebralPSV cm/s55EDV cm/s5 +---------+--------+--+--------+-+  Left Carotid Findings: +----------+--------+--------+--------+------------------+--------+           PSV cm/sEDV cm/sStenosisPlaque DescriptionComments +----------+--------+--------+--------+------------------+--------+ CCA Prox  70      15              homogeneous                +----------+--------+--------+--------+------------------+--------+ CCA Distal67      11  homogeneous                +----------+--------+--------+--------+------------------+--------+ ICA Prox  86      17              heterogenous               +----------+--------+--------+--------+------------------+--------+ ICA Mid   73      20                                         +----------+--------+--------+--------+------------------+--------+ ICA Distal71      21                                         +----------+--------+--------+--------+------------------+--------+ ECA       144     9                                          +----------+--------+--------+--------+------------------+--------+ +----------+--------+--------+--------+-------------------+           PSV cm/sEDV cm/sDescribeArm Pressure (mmHG) +----------+--------+--------+--------+-------------------+ [id number]                                         +----------+--------+--------+--------+-------------------+ +---------+--------+--+--------+--+ VertebralPSV cm/s72EDV cm/s11 +---------+--------+--+--------+--+   Summary: Right Carotid: Velocities in the right ICA are consistent with a 1-39% stenosis. Left Carotid: Velocities in the left ICA are consistent with a 1-39% stenosis.  Vertebrals:  Bilateral vertebral arteries demonstrate antegrade flow. Subclavians: Normal flow hemodynamics were seen in bilateral subclavian              arteries. *See table(s) above for measurements and observations.  Electronically signed by Antony Contras MD on 09/09/2021 at 11:39:15 AM.    Final    MR ANGIO HEAD WO CONTRAST  Result Date: 09/09/2021 CLINICAL DATA:  51 year old female with neurologic deficit. MRI suggesting acute small vessel infarcts in the right frontal lobe white matter yesterday. EXAM: MRA HEAD WITHOUT CONTRAST TECHNIQUE: Angiographic images of the Circle of Willis were acquired using MRA technique without intravenous contrast. COMPARISON:  Brain MRI 09/08/2021.  Intracranial MRA 05/12/2021. FINDINGS: Anterior circulation: Antegrade flow signal in both ICA siphons appears stable since March. Mild siphon irregularity in keeping with atherosclerosis but no hemodynamically significant siphon stenosis identified. Bilateral ophthalmic and posterior communicating artery origins remain patent. Stable carotid termini with mild left MCA origin but moderate to severe left greater than right ACA origin stenoses (series 252, image 12). Both A1 segments remain patent. However, there is severe bilateral distal A2 segment stenosis, stable (series 253, image 1). Additional fronto polar branch stenosis also appears chronic. Right MCA M1 segment and bifurcation are patent without stenosis. Mild to moderate posterior right M2 branch irregularity and stenosis appears stable. Left MCA M1 irregularity and stenosis appears mild. Patent left MCA bifurcation. Moderate and occasionally severe left MCA branch irregularity and stenosis appears chronic and stable (series 252, image 17). Posterior circulation: Tenuous antegrade flow in the posterior circulation does not appear significantly changed from the March CTA. Diminutive and stenotic basilar artery, with segmental flow gaps but maintained flow signal at the  basilar tip in  the setting of fetal type bilateral PCA origins. SCA origins remain patent. Diminutive distal vertebral arteries appear patent without significant stenosis. Both PICA origins remain patent. Moderate to severe bilateral posterior communicating arteries stenoses at the junctions with the P2 segments. Additional severe right PCA P2 segment stenosis (series 255, image 15). Attenuated bilateral distal PCA branch flow signal. These findings are stable since March. Anatomic variants: Fetal type bilateral PCA origins. Other: No intracranial mass effect. IMPRESSION: 1. Stable MRI appearance of Severe Intracranial Atherosclerosis since March. Multifocal high-grade anterior and posterior circulation stenoses, most notable for: - Severe mid Basilar stenosis. - Severe bilateral ACA origin and A2 stenoses. - Severe right greater than left bilateral PCA stenoses. 2. No large vessel occlusion. Electronically Signed   By: Genevie Ann M.D.   On: 09/09/2021 10:17    PHYSICAL EXAM Obese middle-aged African-American lady not in distress. . Afebrile. Head is nontraumatic. Neck is supple without bruit.    Cardiac exam no murmur or gallop. Lungs are clear to auscultation. Distal pulses are well felt.  Patient is awake and alert, oriented x 4 following commands. EOMI, no aphasia or dysarthria. No facial droop. Able to tell a coherent history. bilateral uppers 5/5 no drift. Lowers 5/5. Sensation symmetric.No ataxia  ASSESSMENT/PLAN [name] is a 51 y.o. female with history of  CKD s/p transplant on immunosuppression, DM2, CVA, HTN, HLD, PVD who presents with complaints of decreased left arm   Stroke:  Acute right punctate ischemic infarcts in the right external capsule and periventricular frontal lobe in the setting of dehydration from Hyperglycemia and background of severe multifocal intracranial atherosclerotic disease Etiology:  chronic small vessel disease with underlying multifocal intracranial  atherosclerosis Code Stroke CT head -No acute intracranial abnormality or significant interval change. Stable remote lacunar infarcts of the right external capsule and thalamus. Stable atrophy and white matter disease. This likely reflects the sequela of chronic microvascular ischemia. MRI   1. Possible punctate acute infarcts in the right external capsule and right periventricular frontal lobe. 2. Small remote thalamic lacunar infarcts MRA   1. Stable MRI appearance of Severe Intracranial Atherosclerosis since March. Multifocal high-grade anterior and posterior circulation stenoses, most notable for: - Severe mid Basilar stenosis. - Severe bilateral ACA origin and A2 stenoses. - Severe right greater than left bilateral PCA stenoses. 2. No large vessel occlusion. Carotid Doppler   Right Carotid: Velocities in the right ICA are consistent with a 1-39% stenosis.  Left Carotid: Velocities in the left ICA are consistent with a 1-39% stenosis.  Vertebrals:  Bilateral vertebral arteries demonstrate antegrade flow.  Subclavians: Normal flow hemodynamics were seen in bilateral subclavian arteries.  2D Echo EF 50-55% Carotid US negative  LDL 48 HgbA1c 12.4 VTE prophylaxis - Lovenox    Diet   Diet heart healthy/carb modified Room service appropriate? Yes; Fluid consistency: Thin   aspirin 81 mg daily and clopidogrel 75 mg daily prior to admission, now on aspirin 81 mg daily and Brilinta (ticagrelor) 90 mg bid. For long-term given significant concurrent coronary artery disease  Will need outpatient follow up with Neurology in 8 weeks post discharge with [facility]  therapy recommendations:  outpatient PT/OT Disposition:  pending  Hypertension Home meds:  amlodipine Stable Permissive hypertension (OK if < 220/120) but gradually normalize in 5-7 days Long-term BP goal normotensive  Hyperlipidemia Home meds:  Crestor, resumed in hospital LDL 48, goal < 70 Continue statin at discharge  Diabetes  type II UnControlled Home meds:  insulin HgbA1c  12.4, goal < 7.0 CBGs Recent Labs    09/09/21 1630 09/09/21 2148 09/10/21 0609  GLUCAP 289* 217* 163*    SSI Will need close monitoring with PCP   Other Stroke Risk Factors Obesity, Body mass index is 35.3 kg/m., BMI >/= 30 associated with increased stroke risk, recommend weight loss, diet and exercise as appropriate  Hx stroke/TIA Coronary artery disease   Other Active Problems S/p renal transplant on immunosuppression  Neurology will sign off. Please call for questions or concerns.   Hospital day # 2  Beulah Gandy DNP, ACNPC-AG  I have personally obtained history,examined this patient, reviewed notes, independently viewed imaging studies, participated in medical decision making and plan of care.ROS completed by me personally and pertinent positives fully documented  I have made any additions or clarifications directly to the above note. Agree with note above.  Continue mobilization out of bed.  Physical Occupational Therapy consults.  Aspirin and Brilinta for 3 months if patient can afford it otherwise aspirin and Plavix.  Follow-up with an outpatient stroke clinic in 2 months.  Stroke team will sign off.  Kindly call for questions.  Long discussion patient and husband and Dr. Estanislado Pandy and answered questions.  Greater than 50% time during this 35-minute visit were spent in counseling and coordination of care and discussion patient and greater than family in questions  Antony Contras, MD Medical Director [place] Pager: [phone] 09/10/2021 1:47 PM   To contact Stroke Continuity provider, please refer to [email or web address] After hours, contact General Neurology

## 2021-09-10 NOTE — Progress Notes (Signed)
New order for enteric precautions, pt has only had 1 bm formed 7/15. No indication for enteric precaution. Placed on enteric till evaluated by ID

## 2021-09-11 ENCOUNTER — Telehealth (HOSPITAL_COMMUNITY): Payer: Self-pay | Admitting: Pharmacy Technician

## 2021-09-11 ENCOUNTER — Other Ambulatory Visit (HOSPITAL_COMMUNITY): Payer: Self-pay

## 2021-09-11 DIAGNOSIS — E1122 Type 2 diabetes mellitus with diabetic chronic kidney disease: Secondary | ICD-10-CM | POA: Diagnosis not present

## 2021-09-11 DIAGNOSIS — I639 Cerebral infarction, unspecified: Secondary | ICD-10-CM | POA: Diagnosis not present

## 2021-09-11 DIAGNOSIS — N189 Chronic kidney disease, unspecified: Secondary | ICD-10-CM

## 2021-09-11 DIAGNOSIS — N179 Acute kidney failure, unspecified: Secondary | ICD-10-CM | POA: Diagnosis not present

## 2021-09-11 DIAGNOSIS — Z940 Kidney transplant status: Secondary | ICD-10-CM | POA: Diagnosis not present

## 2021-09-11 LAB — GLUCOSE, CAPILLARY: Glucose-Capillary: 148 mg/dL — ABNORMAL HIGH (ref 70–99)

## 2021-09-11 MED ORDER — INSULIN GLARGINE-YFGN 100 UNIT/ML ~~LOC~~ SOLN: 25.0000 [IU] | Freq: Every day | SUBCUTANEOUS | Status: DC

## 2021-09-11 MED ORDER — ASPIRIN 81 MG PO CHEW: 81.0000 mg | CHEWABLE_TABLET | Freq: Every day | ORAL | 1 refills | Status: DC

## 2021-09-11 MED ORDER — BASAGLAR KWIKPEN 100 UNIT/ML ~~LOC~~ SOPN: 20.0000 [IU] | PEN_INJECTOR | Freq: Two times a day (BID) | SUBCUTANEOUS | 1 refills | Status: DC

## 2021-09-11 MED ORDER — ROSUVASTATIN CALCIUM 20 MG PO TABS: 20.0000 mg | ORAL_TABLET | Freq: Every day | ORAL | 0 refills | Status: AC

## 2021-09-11 MED ORDER — INSULIN GLARGINE-YFGN 100 UNIT/ML ~~LOC~~ SOLN
15.0000 [IU] | Freq: Two times a day (BID) | SUBCUTANEOUS | Status: DC
  Administered 2021-09-11: 15 [IU] via SUBCUTANEOUS
  Filled 2021-09-11 (×2): qty 0.15

## 2021-09-11 NOTE — Telephone Encounter (Signed)
Pharmacy Patient Advocate Encounter  Insurance verification completed.    The patient is insured through [employer]   The patient is currently admitted and ran test claims for the following: Brilinta, basaglar, Levemier, Semglee, Lantus.  Copays and coinsurance results were relayed to Inpatient clinical team.

## 2021-09-11 NOTE — TOC Benefit Eligibility Note (Signed)
Patient Teacher, English as a foreign language completed.    The patient is currently admitted and upon discharge could be taking Brilinta 90 mg.  The current 30 day co-pay is, $0.00.   The patient is currently admitted and upon discharge could be taking Copy.  The current 30 day co-pay is, $0.00.   The patient is currently admitted and upon discharge could be taking Levemier Pens.  The current 30 day co-pay is, $0.00.   The patient is currently admitted and upon discharge could be taking Semglee Pens  Requires Prior Authorization  The patient is currently admitted and upon discharge could be taking Lantus Pens  Requires Prior Authorization  The patient is insured through [place] Patient Advocate Specialist [place] Patient Advocate Team Direct Number: [phone]  Fax: [phone]

## 2021-09-11 NOTE — TOC Transition Note (Signed)
Transition of Care [facility]) - CM/SW Discharge Note   Patient Details  Name: Morgan [name] MRN: [id number] Date of Birth: [birth year]  [facility] CM/SW Contact:  Pollie Friar, RN Phone Number: 09/11/2021, 11:05 AM   Clinical Narrative:    Pt is discharging home with her spouse. Outpatient arranged through [place] where she was attending prior to hospitalization.  Pt states she will obtain the shower seat outside the hospital setting.  CM provided her the co pay card for Brilinta.  Pt has transportation home.    Final next level of care: OP Rehab Barriers to Discharge: No Barriers Identified   Patient Goals and CMS Choice     Choice offered to / list presented to : Patient  Discharge Placement                       Discharge Plan and Services                                     Social Determinants of Health (SDOH) Interventions     Readmission Risk Interventions     No data to display

## 2021-09-11 NOTE — Discharge Summary (Signed)
Physician Discharge Summary  Morgan [name] TKP:[id number] DOB: 26-Mar-1970 DOA: 09/08/2021  PCP: Awanda Mink, MD  Admit date: 09/08/2021 Discharge date: 09/11/2021  Admitted From: Home Disposition:  Home   Recommendations for Outpatient Follow-up:  Follow up with PCP in 1-2 weeks Please obtain BMP/CBC in one week Sent will need to continue with aspirin and Brilinta for 3 weeks, and then to follow-up with neurology in 8 weeks with [facility] regarding further recommendation. Please adjust insulin dose as needed, she was switched from [place] to [place] at it is covered with 0 co-pay.  Home Health: outpatient PT/OT   Discharge Condition:Stable CODE STATUS:FULL Diet recommendation: Heart Healthy / Carb Modified   Brief/Interim Summary:  51 year old F with PMH of CKD s/p transplant on immunosuppressants, IDDM-2, CAD s/p stent on 6/29, PAD, CVA, HTN and HLD presenting with decreased left arm coordination and heaviness while eating brunch, postprandial nausea, polyuria and polydipsia for weeks.  Patient reports running out of [place].  Initially, she reported not taking her Plavix for 2 weeks but she later reports taking her Plavix and low-dose aspirin until the day of presentation.  She also reports running out of her immunosuppressants for 3 days   In ED, code stroke called.  CT head without contrast negative but MRI did show 2 punctate acute infarcts in the right external capsule and right periventricular frontal lobe.  She was also noted to be hyperglycemic but not in DKA or HHS.  She had AKI with Cr 2.05.    The next day, MRA head with severe but stable intracranial atherosclerosis without LVO.  Carotid ultrasound without significant finding.  She was seen by neurology  Acute right MCA CVA involving right external capsule and right periventricular frontal lobe:  - Noted on MRI brain although I am not sure if this fully explains her symptoms specially her left hand paresthesia.   MRA with severe but stable intracranial stenosis involving ACA and PCA but no LVO.Marland Kitchen  Carotid US without significant stenosis.  Patient has significant risk factors with poorly controlled diabetes, previous CVA, HTN and hyperlipidemia.  She was out of her meds including insulin, Plavix and transplant meds.  A1c 12.4%.  LDL 48. -TTE with EF 50 to 55%, -She was seen by neurology, transitioned to Brilinta and aspirin, for next 90 days, to follow-up with GI and urology within 8 weeks regarding further recommendations.. -To continue following with PT/OT as an outpatient   Intracranial arterial stenosis:  - MRA as above. -Optimize diabetic control and compliance with medication   Uncontrolled IDDM-2 with hyperglycemia: A1c 12.4% (10.2% about 4 months ago).  Reports not affording her insulin due to co-pays. Last Labs          Recent Labs  Lab 09/09/21 0602 09/09/21 1243 09/09/21 1630 09/09/21 2148 09/10/21 0609  GLUCAP 193* 263* 289* 217* 163*    -She has been transitioned to [employer] from [place], she will be discharged on 20 units twice daily, adjust dose as an outpatient as needed.  Case management has checked for co-pay, 0 co-pay for Basaglar.     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 Recent Labs (within last 365 days)        Recent Labs    05/12/21 1012 09/08/21 1436 09/08/21 1543 09/09/21 0251  BUN 39* 38* 38* 33*  CREATININE 1.39* 1.90* 2.05* 1.52*    -Resume home transplant meds.   CAD s/p stents on 08/24/2021 at  [facility]:  out of Plavix for 2 weeks?  No angina symptoms. -Now on Brilinta per neurology -Continue home statin   Essential hypertension: Normotensive. -Resume home meds on discharge   Mild Hyperkalemia: Resolved.   Mood disorder: -Continue with home medications   Morbid obesity Body mass index is 35.3 kg/m.    Discharge Diagnoses:  Principal Problem:   Acute CVA (cerebrovascular  accident) ([place]) Active Problems:   S/p cadaver renal transplant CKD stage IIIa   Diabetes mellitus, type 2 ([place])   Essential (primary) hypertension   Hyperlipidemia   Immunosuppression ([place])   Peripheral vascular disease, unspecified [facility])    Discharge Instructions  Discharge Instructions     Ambulatory referral to Occupational Therapy   Complete by: As directed    Ambulatory referral to Physical Therapy   Complete by: As directed    Ambulatory referral to Speech Therapy   Complete by: As directed    Diet - low sodium heart healthy   Complete by: As directed    Discharge instructions   Complete by: As directed    Follow with Primary MD Altamease Oiler, Marcine Matar, MD in 7 days   Get CBC, CMP, checked  by Primary MD next visit.    Activity: As tolerated with Full fall precautions use walker/cane & assistance as needed   Disposition Home    Diet: Heart Healthy /carb modified  , with feeding assistance and aspiration precautions.  For Heart failure patients - Check your Weight same time everyday, if you gain over 2 pounds, or you develop in leg swelling, experience more shortness of breath or chest pain, call your Primary MD immediately. Follow Cardiac Low Salt Diet and 1.5 lit/day fluid restriction.   On your next visit with your primary care physician please Get Medicines reviewed and adjusted.   Please request your Prim.MD to go over all Hospital Tests and Procedure/Radiological results at the follow up, please get all Hospital records sent to your Prim MD by signing hospital release before you go home.   If you experience worsening of your admission symptoms, develop shortness of breath, life threatening emergency, suicidal or homicidal thoughts you must seek medical attention immediately by calling 911 or calling your MD immediately  if symptoms less severe.  You Must read complete instructions/literature along with all the possible adverse reactions/side effects for all  the Medicines you take and that have been prescribed to you. Take any new Medicines after you have completely understood and accpet all the possible adverse reactions/side effects.   Do not drive, operating heavy machinery, perform activities at heights, swimming or participation in water activities or provide baby sitting services if your were admitted for syncope or siezures until you have seen by Primary MD or a Neurologist and advised to do so again.  Do not drive when taking Pain medications.    Do not take more than prescribed Pain, Sleep and Anxiety Medications  Special Instructions: If you have smoked or chewed Tobacco  in the last 2 yrs please stop smoking, stop any regular Alcohol  and or any Recreational drug use.  Wear Seat belts while driving.   Please note  You were cared for by a hospitalist during your hospital stay. If you have any questions about your discharge medications or the care you received while you were in the hospital after you are discharged, you can call the unit and asked to speak with the hospitalist on call if the hospitalist that took care of you is  not available. Once you are discharged, your primary care physician will handle any further medical issues. Please note that NO REFILLS for any discharge medications will be authorized once you are discharged, as it is imperative that you return to your primary care physician (or establish a relationship with a primary care physician if you do not have one) for your aftercare needs so that they can reassess your need for medications and monitor your lab values.   Increase activity slowly   Complete by: As directed       Allergies as of 09/11/2021       Reactions   Fluorescein Shortness Of Breath, Other (See Comments)   Hypertension   Iodinated Contrast Media Anaphylaxis, Hives, Other (See Comments)   Diffuse swelling   Iohexol Anaphylaxis, Hives, Itching, Swelling, Other (See Comments)   13 HR PRE-MEDS  REQUIRED   Latex Itching   Acetaminophen    Other reaction(s): Other (See Comments) CANNOT TAKE DUE TO KIDNEY  CANNOT TAKE DUE TO KIDNEY  CANNOT TAKE DUE TO KIDNEY   Egg White (egg Protein)    Other reaction(s): Other (See Comments) CANNOT TAKE DUE TO AUTO IMMUNE DISORDER  CANNOT TAKE DUE TO AUTO IMMUNE DISORDER  CANNOT TAKE DUE TO AUTO IMMUNE DISORDER    Nsaids Other (See Comments)   CANNOT TAKE DUE TO KIDNEY  CANNOT TAKE DUE TO KIDNEY  CANNOT TAKE DUE TO KIDNEY    Shellfish Allergy    Other reaction(s): Other (See Comments) CANNOT TAKE DUE TO KIDNEY TRANSPLANT  CANNOT TAKE DUE TO KIDNEY TRANSPLANT  CANNOT TAKE DUE TO KIDNEY TRANSPLANT    Lactose Nausea Only   Other reaction(s): Other (See Comments)        Medication List     STOP taking these medications    clopidogrel 75 MG tablet Commonly known as: PLAVIX   HYDROcodone-acetaminophen 5-325 MG tablet Commonly known as: Norco   insulin aspart 100 UNIT/ML injection Commonly known as: novoLOG   pravastatin 20 MG tablet Commonly known as: PRAVACHOL   TRESIBA [place]       TAKE these medications    amLODipine 5 MG tablet Commonly known as: NORVASC Take 5 mg by mouth at bedtime.   aspirin 81 MG chewable tablet Chew 1 tablet (81 mg total) by mouth daily. Start taking on: September 12, 2021   Basaglar KwikPen 100 UNIT/ML Inject 20 Units into the skin 2 (two) times daily.   Dexcom G6 Receiver Devi APPLY ROUTE ONCE   Dexcom G6 Sensor Misc CHANGE SENSOR EVERY 10 DAYS   Dexcom G6 Transmitter Misc USE TO CHECK BLOOD SUGAR AS DIRECTED   folic acid 1 MG tablet Commonly known as: FOLVITE Take 1 mg by mouth every evening.   imiquimod 5 % cream Commonly known as: ALDARA Apply 1 Application topically 2 (two) times a week.   levonorgestrel 20 MCG/24HR IUD Commonly known as: MIRENA 1 each by Intrauterine route once.   lidocaine-prilocaine cream Commonly known as: EMLA Apply 1 application topically daily as needed  (dialysis).   MM Pen Needles 31G X 5 MM Misc Generic drug: Insulin Pen Needle USE  IN THE MORNING AND AT BEDTIME   mycophenolate 250 MG capsule Commonly known as: CELLCEPT Take 750 mg by mouth daily. Take 2 capsules (500 mg total) by mouth once daily AND 1 capsule (250 mg total) at bedtime.   OneTouch Delica Plus [id number] Misc USE TWICE A DAY TO CHECK BLOOD SUGAR   OneTouch Verio test strip Generic drug:  glucose blood 1 each by Other route 2 (two) times daily. And lancets 2/day   Ozempic (1 MG/DOSE) 4 MG/3ML Sopn Generic drug: Semaglutide (1 MG/DOSE) Inject 1 mg into the skin once a week.   PARoxetine 10 MG tablet Commonly known as: PAXIL Take 10 mg by mouth daily.   polyethylene glycol 17 g packet Commonly known as: MIRALAX / GLYCOLAX Take 17 g by mouth daily as needed for mild constipation.   predniSONE 5 MG tablet Commonly known as: DELTASONE Take 5 mg by mouth daily with breakfast.   ranitidine 150 MG tablet Commonly known as: ZANTAC Take 150 mg by mouth every evening.   RENA-VITE PO Take 1 tablet by mouth daily.   rosuvastatin 20 MG tablet Commonly known as: CRESTOR Take 1 tablet (20 mg total) by mouth daily.   sodium bicarbonate 650 MG tablet Take 1,300 mg by mouth 2 (two) times daily.   tacrolimus 1 MG capsule Commonly known as: PROGRAF Take 4 mg by mouth 2 (two) times daily.   ticagrelor 90 MG Tabs tablet Commonly known as: BRILINTA Take 1 tablet (90 mg total) by mouth 2 (two) times daily.   valGANciclovir 450 MG tablet Commonly known as: VALCYTE Take 450 mg by mouth daily.        Follow-up Information     [facility] Follow up.   Specialty: Rehabilitation Why: The outpatient rehab will contact you for the next visit. Contact information: [address] [place], [place] [address] [id number] [place] [address] [phone]               Allergies  Allergen Reactions   Fluorescein Shortness Of  Breath and Other (See Comments)    Hypertension   Iodinated Contrast Media Anaphylaxis, Hives and Other (See Comments)    Diffuse swelling   Iohexol Anaphylaxis, Hives, Itching, Swelling and Other (See Comments)    13 HR PRE-MEDS REQUIRED   Latex Itching   Acetaminophen     Other reaction(s): Other (See Comments) CANNOT TAKE DUE TO KIDNEY  CANNOT TAKE DUE TO KIDNEY  CANNOT TAKE DUE TO KIDNEY   Egg White (Egg Protein)     Other reaction(s): Other (See Comments) CANNOT TAKE DUE TO AUTO IMMUNE DISORDER  CANNOT TAKE DUE TO AUTO IMMUNE DISORDER  CANNOT TAKE DUE TO AUTO IMMUNE DISORDER    Nsaids Other (See Comments)    CANNOT TAKE DUE TO KIDNEY  CANNOT TAKE DUE TO KIDNEY  CANNOT TAKE DUE TO KIDNEY    Shellfish Allergy     Other reaction(s): Other (See Comments) CANNOT TAKE DUE TO KIDNEY TRANSPLANT  CANNOT TAKE DUE TO KIDNEY TRANSPLANT  CANNOT TAKE DUE TO KIDNEY TRANSPLANT    Lactose Nausea Only    Other reaction(s): Other (See Comments)    Consultations: Neurolgoy   Procedures/Studies: ECHOCARDIOGRAM COMPLETE  Result Date: 09/09/2021    ECHOCARDIOGRAM REPORT   Patient Name:   Morgan [name] Date of Exam: 09/09/2021 Medical Rec #:  [id number]                Height:       62.0 in Accession #:    [id number]               Weight:       193.0 lb Date of Birth:  [birth year]                BSA:          1.883 m Patient Age:  50 years                 BP:           109/75 mmHg Patient Gender: F                        HR:           90 bpm. Exam Location:  Inpatient Procedure: 2D Echo, Cardiac Doppler, Color Doppler and 3D Echo Indications:    Stroke  History:        Patient has prior history of Echocardiogram examinations, most                 recent 05/13/2021. CAD; Stroke.  Sonographer:    Merrie Roof RDCS Referring Phys: [place]  1. Left ventricular ejection fraction, by estimation, is 50 to 55%. The left ventricle has low normal function. The left ventricle  demonstrates regional wall motion abnormalities (see scoring diagram/findings for description). There is mild left ventricular hypertrophy. Left ventricular diastolic parameters are indeterminate. There is moderate hypokinesis of the left ventricular, basal inferior wall.  2. Right ventricular systolic function is normal. The right ventricular size is normal.  3. Left atrial size was mild to moderately dilated.  4. The mitral valve is abnormal. Trivial mitral valve regurgitation. No evidence of mitral stenosis. Moderate mitral annular calcification.  5. The aortic valve was not well visualized. Aortic valve regurgitation is not visualized. No aortic stenosis is present.  6. The inferior vena cava is normal in size with greater than 50% respiratory variability, suggesting right atrial pressure of 3 mmHg. Comparison(s): No significant change from prior study. Conclusion(s)/Recommendation(s): No intracardiac source of embolism detected on this transthoracic study. Consider a transesophageal echocardiogram to exclude cardiac source of embolism if clinically indicated. FINDINGS  Left Ventricle: Left ventricular ejection fraction, by estimation, is 50 to 55%. The left ventricle has low normal function. The left ventricle demonstrates regional wall motion abnormalities. Moderate hypokinesis of the left ventricular, basal inferior  wall. 3D left ventricular ejection fraction analysis performed but not reported based on interpreter judgement due to suboptimal tracking. The left ventricular internal cavity size was normal in size. There is mild left ventricular hypertrophy. Left ventricular diastolic parameters are indeterminate. Right Ventricle: The right ventricular size is normal. Right vetricular wall thickness was not well visualized. Right ventricular systolic function is normal. Left Atrium: Left atrial size was mild to moderately dilated. Right Atrium: Right atrial size was normal in size. Pericardium: There is no  evidence of pericardial effusion. Mitral Valve: The mitral valve is abnormal. There is mild thickening of the mitral valve leaflet(s). There is mild calcification of the mitral valve leaflet(s). Moderate mitral annular calcification. Trivial mitral valve regurgitation. No evidence of mitral valve stenosis. Tricuspid Valve: The tricuspid valve is normal in structure. Tricuspid valve regurgitation is trivial. No evidence of tricuspid stenosis. Aortic Valve: The aortic valve was not well visualized. Aortic valve regurgitation is not visualized. No aortic stenosis is present. Aortic valve mean gradient measures 3.0 mmHg. Aortic valve peak gradient measures 5.4 mmHg. Aortic valve area, by VTI measures 1.87 cm. Pulmonic Valve: The pulmonic valve was not well visualized. Pulmonic valve regurgitation is not visualized. Aorta: The aortic root, ascending aorta and aortic arch are all structurally normal, with no evidence of dilitation or obstruction. Venous: The inferior vena cava is normal in size with greater than 50% respiratory variability, suggesting right atrial pressure of 3  mmHg. IAS/Shunts: The interatrial septum was not well visualized.  LEFT VENTRICLE PLAX 2D LVIDd:         4.20 cm     Diastology LVIDs:         3.40 cm     LV e' medial:    6.53 cm/s LV PW:         1.20 cm     LV E/e' medial:  12.7 LV IVS:        1.10 cm     LV e' lateral:   3.15 cm/s LVOT diam:     1.70 cm     LV E/e' lateral: 26.3 LV SV:         42 LV SV Index:   22 LVOT Area:     2.27 cm                             3D Volume EF: LV Volumes (MOD)           3D EF:        47 % LV vol d, MOD A2C: 49.8 ml LV EDV:       83 ml LV vol d, MOD A4C: 67.4 ml LV ESV:       44 ml LV vol s, MOD A2C: 25.6 ml LV SV:        39 ml LV vol s, MOD A4C: 29.2 ml LV SV MOD A2C:     24.2 ml LV SV MOD A4C:     67.4 ml LV SV MOD BP:      32.1 ml RIGHT VENTRICLE RV Basal diam:  3.30 cm RV S prime:     14.70 cm/s TAPSE (M-mode): 2.3 cm LEFT ATRIUM             Index         RIGHT ATRIUM           Index LA diam:        4.00 cm 2.12 cm/m   RA Area:     13.50 cm LA Vol (A2C):   81.0 ml 43.02 ml/m  RA Volume:   31.40 ml  16.68 ml/m LA Vol (A4C):   36.2 ml 19.23 ml/m LA Biplane Vol: 56.4 ml 29.95 ml/m  AORTIC VALVE AV Area (Vmax):    1.76 cm AV Area (Vmean):   1.74 cm AV Area (VTI):     1.87 cm AV Vmax:           116.00 cm/s AV Vmean:          81.500 cm/s AV VTI:            0.222 m AV Peak Grad:      5.4 mmHg AV Mean Grad:      3.0 mmHg LVOT Vmax:         89.90 cm/s LVOT Vmean:        62.600 cm/s LVOT VTI:          0.183 m LVOT/AV VTI ratio: 0.82  AORTA Ao Root diam: 2.50 cm Ao Asc diam:  3.20 cm Ao Arch diam: 2.8 cm MITRAL VALVE MV Area (PHT): 4.60 cm     SHUNTS MV Decel Time: 165 msec     Systemic VTI:  0.18 m MV E velocity: 83.00 cm/s   Systemic Diam: 1.70 cm MV A velocity: 119.00 cm/s MV E/A ratio:  0.70 Buford Dresser MD Electronically signed by Buford Dresser MD  Signature Date/Time: 09/09/2021/3:06:59 PM    Final    VAS US CAROTID  Result Date: 09/09/2021 Carotid Arterial Duplex Study Patient Name:  Morgan [name]  Date of Exam:   09/09/2021 Medical Rec #: [id number]                 Accession #:    [id number] Date of Birth: [birth year]                 Patient Gender: F Patient Age:   28 years Exam Location:  [facility] Procedure:      VAS US CAROTID Referring Phys: Bretta Bang GONFA --------------------------------------------------------------------------------  Indications:       CVA and Lack of coordination with left arm, visual                    disturbance. Risk Factors:      Hypertension, hyperlipidemia, Diabetes, prior CVA. Other Factors:     Severe Intracranial Atherosclerosis                    Multifocal high-grade anterior and posterior                    circulation stenoses, most notable for:                    - Severe mid Basilar stenosis.                    - Severe bilateral ACA origin and A2 stenoses.                    - Severe  right greater than left bilateral PCA stenoses. Comparison Study:  No prior study Performing Technologist: Sharion Dove RVS  Examination Guidelines: A complete evaluation includes B-mode imaging, spectral Doppler, color Doppler, and power Doppler as needed of all accessible portions of each vessel. Bilateral testing is considered an integral part of a complete examination. Limited examinations for reoccurring indications may be performed as noted.  Right Carotid Findings: +----------+--------+--------+--------+------------------+--------+           PSV cm/sEDV cm/sStenosisPlaque DescriptionComments +----------+--------+--------+--------+------------------+--------+ CCA Prox  82      20              homogeneous                +----------+--------+--------+--------+------------------+--------+ CCA Distal83      11              homogeneous                +----------+--------+--------+--------+------------------+--------+ ICA Prox  74      15              heterogenous               +----------+--------+--------+--------+------------------+--------+ ICA Mid   71      19                                         +----------+--------+--------+--------+------------------+--------+ ICA Distal78      17                                         +----------+--------+--------+--------+------------------+--------+ ECA       132  2                                          +----------+--------+--------+--------+------------------+--------+ +----------+--------+-------+--------+-------------------+           PSV cm/sEDV cmsDescribeArm Pressure (mmHG) +----------+--------+-------+--------+-------------------+ Subclavian210                                        +----------+--------+-------+--------+-------------------+ +---------+--------+--+--------+-+ VertebralPSV cm/s55EDV cm/s5 +---------+--------+--+--------+-+  Left Carotid Findings:  +----------+--------+--------+--------+------------------+--------+           PSV cm/sEDV cm/sStenosisPlaque DescriptionComments +----------+--------+--------+--------+------------------+--------+ CCA Prox  70      15              homogeneous                +----------+--------+--------+--------+------------------+--------+ CCA Distal67      11              homogeneous                +----------+--------+--------+--------+------------------+--------+ ICA Prox  86      17              heterogenous               +----------+--------+--------+--------+------------------+--------+ ICA Mid   73      20                                         +----------+--------+--------+--------+------------------+--------+ ICA Distal71      21                                         +----------+--------+--------+--------+------------------+--------+ ECA       144     9                                          +----------+--------+--------+--------+------------------+--------+ +----------+--------+--------+--------+-------------------+           PSV cm/sEDV cm/sDescribeArm Pressure (mmHG) +----------+--------+--------+--------+-------------------+ [id number]                                         +----------+--------+--------+--------+-------------------+ +---------+--------+--+--------+--+ VertebralPSV cm/s72EDV cm/s11 +---------+--------+--+--------+--+   Summary: Right Carotid: Velocities in the right ICA are consistent with a 1-39% stenosis. Left Carotid: Velocities in the left ICA are consistent with a 1-39% stenosis. Vertebrals:  Bilateral vertebral arteries demonstrate antegrade flow. Subclavians: Normal flow hemodynamics were seen in bilateral subclavian              arteries. *See table(s) above for measurements and observations.  Electronically signed by Antony Contras MD on 09/09/2021 at 11:39:15 AM.    Final    MR ANGIO HEAD WO CONTRAST  Result Date:  09/09/2021 CLINICAL DATA:  51 year old female with neurologic deficit. MRI suggesting acute small vessel infarcts in the right frontal lobe white matter yesterday. EXAM: MRA HEAD WITHOUT CONTRAST TECHNIQUE: Angiographic images of the Circle of Willis were acquired using MRA technique  without intravenous contrast. COMPARISON:  Brain MRI 09/08/2021.  Intracranial MRA 05/12/2021. FINDINGS: Anterior circulation: Antegrade flow signal in both ICA siphons appears stable since March. Mild siphon irregularity in keeping with atherosclerosis but no hemodynamically significant siphon stenosis identified. Bilateral ophthalmic and posterior communicating artery origins remain patent. Stable carotid termini with mild left MCA origin but moderate to severe left greater than right ACA origin stenoses (series 252, image 12). Both A1 segments remain patent. However, there is severe bilateral distal A2 segment stenosis, stable (series 253, image 1). Additional fronto polar branch stenosis also appears chronic. Right MCA M1 segment and bifurcation are patent without stenosis. Mild to moderate posterior right M2 branch irregularity and stenosis appears stable. Left MCA M1 irregularity and stenosis appears mild. Patent left MCA bifurcation. Moderate and occasionally severe left MCA branch irregularity and stenosis appears chronic and stable (series 252, image 17). Posterior circulation: Tenuous antegrade flow in the posterior circulation does not appear significantly changed from the March CTA. Diminutive and stenotic basilar artery, with segmental flow gaps but maintained flow signal at the basilar tip in the setting of fetal type bilateral PCA origins. SCA origins remain patent. Diminutive distal vertebral arteries appear patent without significant stenosis. Both PICA origins remain patent. Moderate to severe bilateral posterior communicating arteries stenoses at the junctions with the P2 segments. Additional severe right PCA P2  segment stenosis (series 255, image 15). Attenuated bilateral distal PCA branch flow signal. These findings are stable since March. Anatomic variants: Fetal type bilateral PCA origins. Other: No intracranial mass effect. IMPRESSION: 1. Stable MRI appearance of Severe Intracranial Atherosclerosis since March. Multifocal high-grade anterior and posterior circulation stenoses, most notable for: - Severe mid Basilar stenosis. - Severe bilateral ACA origin and A2 stenoses. - Severe right greater than left bilateral PCA stenoses. 2. No large vessel occlusion. Electronically Signed   By: Genevie Ann M.D.   On: 09/09/2021 10:17   MR BRAIN WO CONTRAST  Result Date: 09/08/2021 CLINICAL DATA:  Neuro deficit, acute, stroke suspected EXAM: MRI HEAD WITHOUT CONTRAST TECHNIQUE: Multiplanar, multiecho pulse sequences of the brain and surrounding structures were obtained without intravenous contrast. COMPARISON:  Same day CT.  MRI May 12, 2021. FINDINGS: Brain: Possible punctate acute infarct in the right external capsule (for example series 2 and 250, image 22) and right periventricular frontal lobe (series 2 and 250, image 31). No acute hemorrhage, hydrocephalus, extra-axial collection or mass lesion. Small remote infarcts in the thalamus bilaterally. Cerebral atrophy. Scattered T2/FLAIR hyperintensities the white matter, nonspecific but compatible with chronic microvascular ischemic disease. Vascular: Major arterial flow voids are maintained at the skull base. Skull and upper cervical spine: Normal marrow signal. Sinuses/Orbits: Clear sinuses.  No acute orbital findings. Other: No mastoid effusions. IMPRESSION: 1. Possible punctate acute infarcts in the right external capsule and right periventricular frontal lobe. 2. Small remote thalamic lacunar infarcts. Electronically Signed   By: Margaretha Sheffield M.D.   On: 09/08/2021 15:46   CT HEAD CODE STROKE WO CONTRAST  Result Date: 09/08/2021 CLINICAL DATA:  Code stroke.  Acute  right-sided tingling. EXAM: CT HEAD WITHOUT CONTRAST TECHNIQUE: Contiguous axial images were obtained from the base of the skull through the vertex without intravenous contrast. RADIATION DOSE REDUCTION: This exam was performed according to the departmental dose-optimization program which includes automated exposure control, adjustment of the mA and/or kV according to patient size and/or use of iterative reconstruction technique. COMPARISON:  CT head 05/12/2021. MR head 05/12/2021 FINDINGS: Brain: Remote lacunar infarcts are again noted  within the right external capsule and thalamus. Mild diffuse periventricular white matter hypoattenuation is advanced for age. No acute infarct, hemorrhage, or mass lesion is present. The ventricles are of normal size. No significant extraaxial fluid collection is present. Vascular: Atherosclerotic calcifications are present within the cavernous internal carotid arteries bilaterally. No hyperdense vessel is present. Skull: Calvarium is intact. No focal lytic or blastic lesions are present. No significant extracranial soft tissue lesion is present. Sinuses/Orbits: Chronic right sphenoid sinus disease again noted. The paranasal sinuses and mastoid air cells are otherwise clear. Bilateral lens replacements are noted. Globes and orbits are otherwise unremarkable. ASPECTS [facility] Stroke Program Early CT Score) - Ganglionic level infarction (caudate, lentiform nuclei, internal capsule, insula, M1-M3 cortex): 7/7 - Supraganglionic infarction (M4-M6 cortex): 3/3 Total score (0-10 with 10 being normal): 10/10 IMPRESSION: 1. No acute intracranial abnormality or significant interval change. 2. Stable remote lacunar infarcts of the right external capsule and thalamus. 3. Stable atrophy and white matter disease. This likely reflects the sequela of chronic microvascular ischemia. 4. ASPECTS is 10/10. The above was relayed via text pager to Dr. Rory Percy on 09/08/2021 at 14:56 . Electronically Signed    By: San Morelle M.D.   On: 09/08/2021 14:56      Subjective:  Denies any new complaints, no significant events overnight. Discharge Exam: Vitals:   09/11/21 0410 09/11/21 0759  BP: 96/76 100/82  Pulse: 88   Resp: 16   Temp: 97.9 F (36.6 C) 98 F (36.7 C)  SpO2: 100% 100%   Vitals:   09/10/21 1915 09/10/21 2347 09/11/21 0410 09/11/21 0759  BP: (!) 132/49 98/61 96/76  100/82  Pulse: 99 95 88   Resp: 16 16 16    Temp: 97.8 F (36.6 C) 97.9 F (36.6 C) 97.9 F (36.6 C) 98 F (36.7 C)  TempSrc:  Oral Oral Oral  SpO2: 100%  100% 100%  Weight:      Height:        General: Pt is alert, awake, not in acute distress Cardiovascular: RRR, S1/S2 +, no rubs, no gallops Respiratory: CTA bilaterally, no wheezing, no rhonchi Abdominal: Soft, NT, ND, bowel sounds + Extremities: no edema, no cyanosis    The results of significant diagnostics from this hospitalization (including imaging, microbiology, ancillary and laboratory) are listed below for reference.     Microbiology: Recent Results (from the past 240 hour(s))  Resp Panel by RT-PCR (Flu A&B, Covid) Anterior Nasal Swab     Status: None   Collection Time: 09/08/21  2:26 PM   Specimen: Anterior Nasal Swab  Result Value Ref Range Status   SARS Coronavirus 2 by RT PCR NEGATIVE NEGATIVE Final    Comment: (NOTE) SARS-CoV-2 target nucleic acids are NOT DETECTED.  The SARS-CoV-2 RNA is generally detectable in upper respiratory specimens during the acute phase of infection. The lowest concentration of SARS-CoV-2 viral copies this assay can detect is 138 copies/mL. A negative result does not preclude SARS-Cov-2 infection and should not be used as the sole basis for treatment or other patient management decisions. A negative result may occur with  improper specimen collection/handling, submission of specimen other than nasopharyngeal swab, presence of viral mutation(s) within the areas targeted by this assay, and  inadequate number of viral copies(<138 copies/mL). A negative result must be combined with clinical observations, patient history, and epidemiological information. The expected result is Negative.  Fact Sheet for Patients:  EntrepreneurPulse.com.au  Fact Sheet for Healthcare Providers:  IncredibleEmployment.be  This test is no t yet  approved or cleared by the [place] and  has been authorized for detection and/or diagnosis of SARS-CoV-2 by FDA under an Emergency Use Authorization (EUA). This EUA will remain  in effect (meaning this test can be used) for the duration of the COVID-19 declaration under Section 564(b)(1) of the Act, 21 U.S.C.section 360bbb-3(b)(1), unless the authorization is terminated  or revoked sooner.       Influenza A by PCR NEGATIVE NEGATIVE Final   Influenza B by PCR NEGATIVE NEGATIVE Final    Comment: (NOTE) The Xpert Xpress SARS-CoV-2/FLU/RSV plus assay is intended as an aid in the diagnosis of influenza from Nasopharyngeal swab specimens and should not be used as a sole basis for treatment. Nasal washings and aspirates are unacceptable for Xpert Xpress SARS-CoV-2/FLU/RSV testing.  Fact Sheet for Patients: EntrepreneurPulse.com.au  Fact Sheet for Healthcare Providers: IncredibleEmployment.be  This test is not yet approved or cleared by the [place] FDA and has been authorized for detection and/or diagnosis of SARS-CoV-2 by FDA under an Emergency Use Authorization (EUA). This EUA will remain in effect (meaning this test can be used) for the duration of the COVID-19 declaration under Section 564(b)(1) of the Act, 21 U.S.C. section 360bbb-3(b)(1), unless the authorization is terminated or revoked.  Performed at [facility], [place] [address] [place], [place] [address]      Labs: BNP (last 3 results) No results for input(s): "BNP" in the last 8760  hours. Basic Metabolic Panel: Recent Labs  Lab 09/08/21 1436 09/08/21 1543 09/09/21 0251  NA 128* 132* 138  K 4.7 5.2* 3.6  CL 99 95* 105  CO2  --  21* 22  GLUCOSE 687* 527* 164*  BUN 38* 38* 33*  CREATININE 1.90* 2.05* 1.52*  CALCIUM  --  10.5* 9.4  MG  --   --  2.0  PHOS  --   --  2.7   Liver Function Tests: Recent Labs  Lab 09/08/21 1543 09/09/21 0251  AST 29  --   ALT 24  --   ALKPHOS 100  --   BILITOT 1.0  --   PROT 6.8  --   ALBUMIN 3.6 3.1*   No results for input(s): "LIPASE", "AMYLASE" in the last 168 hours. No results for input(s): "AMMONIA" in the last 168 hours. CBC: Recent Labs  Lab 09/08/21 1426 09/08/21 1436 09/09/21 0251  WBC 7.3  --  6.9  NEUTROABS 4.6  --   --   HGB 16.4* 17.0* 15.8*  HCT 48.6* 50.0* 45.8  MCV 81.4  --  80.4  PLT 297  --  301   Cardiac Enzymes: Recent Labs  Lab 09/09/21 0251  CKTOTAL 96   BNP: Invalid input(s): "POCBNP" CBG: Recent Labs  Lab 09/10/21 1315 09/10/21 1640 09/10/21 1912 09/10/21 2114 09/11/21 0620  GLUCAP 322* 284* 317* 273* 148*   D-Dimer No results for input(s): "DDIMER" in the last 72 hours. Hgb A1c Recent Labs    09/09/21 0251  HGBA1C 12.4*   Lipid Profile Recent Labs    09/09/21 0251  CHOL 119  HDL 34*  LDLCALC 48  TRIG 183*  CHOLHDL 3.5   Thyroid function studies No results for input(s): "TSH", "T4TOTAL", "T3FREE", "THYROIDAB" in the last 72 hours.  Invalid input(s): "FREET3" Anemia work up No results for input(s): "VITAMINB12", "FOLATE", "FERRITIN", "TIBC", "IRON", "RETICCTPCT" in the last 72 hours. Urinalysis    Component Value Date/Time   COLORURINE ORANGE (A) 09/22/2014 1415   APPEARANCEUR CLOUDY (A) 09/22/2014 1415  LABSPEC 1.014 09/22/2014 1415   PHURINE 8.0 09/22/2014 1415   GLUCOSEU 500 (A) 09/22/2014 1415   HGBUR LARGE (A) 09/22/2014 1415   BILIRUBINUR neg 10/06/2014 1359   KETONESUR 15 (A) 09/22/2014 1415   PROTEINUR 100 10/06/2014 1359   PROTEINUR 100 (A)  09/22/2014 1415   UROBILINOGEN 0.2 10/06/2014 1359   UROBILINOGEN 0.2 09/22/2014 1415   NITRITE neg 10/06/2014 1359   NITRITE NEGATIVE 09/22/2014 1415   LEUKOCYTESUR Negative 10/06/2014 1359   Sepsis Labs Recent Labs  Lab 09/08/21 1426 09/09/21 0251  WBC 7.3 6.9   Microbiology Recent Results (from the past 240 hour(s))  Resp Panel by RT-PCR (Flu A&B, Covid) Anterior Nasal Swab     Status: None   Collection Time: 09/08/21  2:26 PM   Specimen: Anterior Nasal Swab  Result Value Ref Range Status   SARS Coronavirus 2 by RT PCR NEGATIVE NEGATIVE Final    Comment: (NOTE) SARS-CoV-2 target nucleic acids are NOT DETECTED.  The SARS-CoV-2 RNA is generally detectable in upper respiratory specimens during the acute phase of infection. The lowest concentration of SARS-CoV-2 viral copies this assay can detect is 138 copies/mL. A negative result does not preclude SARS-Cov-2 infection and should not be used as the sole basis for treatment or other patient management decisions. A negative result may occur with  improper specimen collection/handling, submission of specimen other than nasopharyngeal swab, presence of viral mutation(s) within the areas targeted by this assay, and inadequate number of viral copies(<138 copies/mL). A negative result must be combined with clinical observations, patient history, and epidemiological information. The expected result is Negative.  Fact Sheet for Patients:  EntrepreneurPulse.com.au  Fact Sheet for Healthcare Providers:  IncredibleEmployment.be  This test is no t yet approved or cleared by the [place] FDA and  has been authorized for detection and/or diagnosis of SARS-CoV-2 by FDA under an Emergency Use Authorization (EUA). This EUA will remain  in effect (meaning this test can be used) for the duration of the COVID-19 declaration under Section 564(b)(1) of the Act, 21 U.S.C.section 360bbb-3(b)(1), unless  the authorization is terminated  or revoked sooner.       Influenza A by PCR NEGATIVE NEGATIVE Final   Influenza B by PCR NEGATIVE NEGATIVE Final    Comment: (NOTE) The Xpert Xpress SARS-CoV-2/FLU/RSV plus assay is intended as an aid in the diagnosis of influenza from Nasopharyngeal swab specimens and should not be used as a sole basis for treatment. Nasal washings and aspirates are unacceptable for Xpert Xpress SARS-CoV-2/FLU/RSV testing.  Fact Sheet for Patients: EntrepreneurPulse.com.au  Fact Sheet for Healthcare Providers: IncredibleEmployment.be  This test is not yet approved or cleared by the [place] FDA and has been authorized for detection and/or diagnosis of SARS-CoV-2 by FDA under an Emergency Use Authorization (EUA). This EUA will remain in effect (meaning this test can be used) for the duration of the COVID-19 declaration under Section 564(b)(1) of the Act, 21 U.S.C. section 360bbb-3(b)(1), unless the authorization is terminated or revoked.  Performed at [facility], [place] [address] [place], [place] [address]      Time coordinating discharge: Over 30 minutes  SIGNED:   Phillips Climes, MD  [facility] 09/11/2021, 10:25 AM Pager   If 7PM-7AM, please contact night-coverage [email or web address]

## 2021-09-19 ENCOUNTER — Ambulatory Visit: Payer: BC Managed Care – PPO | Attending: Family Medicine | Admitting: Occupational Therapy

## 2021-09-19 ENCOUNTER — Ambulatory Visit: Payer: BC Managed Care – PPO

## 2021-09-19 DIAGNOSIS — R2681 Unsteadiness on feet: Secondary | ICD-10-CM

## 2021-09-19 DIAGNOSIS — R262 Difficulty in walking, not elsewhere classified: Secondary | ICD-10-CM | POA: Insufficient documentation

## 2021-09-19 DIAGNOSIS — I69353 Hemiplegia and hemiparesis following cerebral infarction affecting right non-dominant side: Secondary | ICD-10-CM | POA: Insufficient documentation

## 2021-09-19 DIAGNOSIS — M6281 Muscle weakness (generalized): Secondary | ICD-10-CM

## 2021-09-19 DIAGNOSIS — R208 Other disturbances of skin sensation: Secondary | ICD-10-CM | POA: Insufficient documentation

## 2021-09-19 DIAGNOSIS — R278 Other lack of coordination: Secondary | ICD-10-CM | POA: Insufficient documentation

## 2021-09-19 DIAGNOSIS — I69352 Hemiplegia and hemiparesis following cerebral infarction affecting left dominant side: Secondary | ICD-10-CM | POA: Insufficient documentation

## 2021-09-19 DIAGNOSIS — R41841 Cognitive communication deficit: Secondary | ICD-10-CM
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
## 2021-09-19 NOTE — Therapy (Deleted)
[facility] [address] [place], [place], [address] Phone: [phone]   Fax:  [phone]  Patient Details  Name: Morgan [name] MRN: [id number] Date of Birth: 1970/04/13 Referring Provider:  Awanda Mink, *  Encounter Date: 09/19/2021 PHYSICAL THERAPY DISCHARGE SUMMARY  Visits from Start of Care: 6  Current functional level related to goals / functional outcomes: Progressing with POC details, did not return after last visit   Remaining deficits: Unable to determine   Education / Equipment: HEP   Patient agrees to discharge. Patient goals were partially met. Patient is being discharged due to not returning since the last visit.   Toniann Fail, PT 09/19/2021, 8:41 AM  [facility] [address] [place], [place], [address] Phone: [phone]   Fax:  [phone]
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
## 2021-09-19 NOTE — Therapy (Signed)
[facility] [address] [place], [place], [address] Phone: [phone]   Fax:  [phone]  Patient Details  Name: JAQUAYLA HEGE MRN: [id number] Date of Birth: 1971-01-27 Referring Provider:  No ref. provider found  Encounter Date: 09/19/2021 PHYSICAL THERAPY DISCHARGE SUMMARY  Visits from Start of Care: 6  Current functional level related to goals / functional outcomes: Progressing with POC details.  Did not return after last visit   Remaining deficits: Unable to determine   Education / Equipment: HEP   Patient agrees to discharge. Patient goals were partially met. Patient is being discharged due to not returning since the last visit.   Toniann Fail, PT 09/19/2021, 8:49 AM  [facility] [address] [place], [place], [address] Phone: [phone]   Fax:  [phone]

## 2021-09-20 ENCOUNTER — Other Ambulatory Visit: Payer: Self-pay

## 2021-09-26 ENCOUNTER — Ambulatory Visit: Payer: BC Managed Care – PPO

## 2021-09-26 ENCOUNTER — Ambulatory Visit: Payer: BC Managed Care – PPO | Attending: Family Medicine | Admitting: Occupational Therapy

## 2021-09-26 DIAGNOSIS — I69353 Hemiplegia and hemiparesis following cerebral infarction affecting right non-dominant side: Secondary | ICD-10-CM

## 2021-09-26 DIAGNOSIS — R41841 Cognitive communication deficit: Secondary | ICD-10-CM | POA: Diagnosis present

## 2021-09-26 DIAGNOSIS — I69352 Hemiplegia and hemiparesis following cerebral infarction affecting left dominant side: Secondary | ICD-10-CM | POA: Diagnosis present

## 2021-09-26 DIAGNOSIS — R278 Other lack of coordination: Secondary | ICD-10-CM | POA: Diagnosis present

## 2021-09-26 DIAGNOSIS — R208 Other disturbances of skin sensation: Secondary | ICD-10-CM | POA: Diagnosis present

## 2021-09-26 DIAGNOSIS — M6281 Muscle weakness (generalized): Secondary | ICD-10-CM
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
## 2021-09-28 ENCOUNTER — Ambulatory Visit: Payer: BC Managed Care – PPO

## 2021-09-28 ENCOUNTER — Ambulatory Visit: Payer: BC Managed Care – PPO | Admitting: Occupational Therapy

## 2021-09-28 DIAGNOSIS — R278 Other lack of coordination: Secondary | ICD-10-CM

## 2021-09-28 DIAGNOSIS — R41841 Cognitive communication deficit: Secondary | ICD-10-CM

## 2021-09-28 DIAGNOSIS — M6281 Muscle weakness (generalized): Secondary | ICD-10-CM

## 2021-09-28 DIAGNOSIS — I69352 Hemiplegia and hemiparesis following cerebral infarction affecting left dominant side: Secondary | ICD-10-CM | POA: Diagnosis not present

## 2021-09-28 DIAGNOSIS — I69353 Hemiplegia and hemiparesis following cerebral infarction affecting right non-dominant side: Secondary | ICD-10-CM

## 2021-09-28 DIAGNOSIS — R208 Other disturbances of skin sensation: Secondary | ICD-10-CM
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
## 2021-10-03 ENCOUNTER — Ambulatory Visit: Payer: BC Managed Care – PPO | Admitting: Occupational Therapy

## 2021-10-03 ENCOUNTER — Ambulatory Visit: Payer: BC Managed Care – PPO

## 2021-10-03 DIAGNOSIS — R278 Other lack of coordination: Secondary | ICD-10-CM

## 2021-10-03 DIAGNOSIS — I69353 Hemiplegia and hemiparesis following cerebral infarction affecting right non-dominant side: Secondary | ICD-10-CM

## 2021-10-03 DIAGNOSIS — R208 Other disturbances of skin sensation: Secondary | ICD-10-CM

## 2021-10-03 DIAGNOSIS — M6281 Muscle weakness (generalized): Secondary | ICD-10-CM

## 2021-10-03 DIAGNOSIS — R41841 Cognitive communication deficit: Secondary | ICD-10-CM

## 2021-10-03 DIAGNOSIS — I69352 Hemiplegia and hemiparesis following cerebral infarction affecting left dominant side: Secondary | ICD-10-CM | POA: Diagnosis not present
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
## 2021-10-05 ENCOUNTER — Ambulatory Visit: Payer: BC Managed Care – PPO

## 2021-10-05 ENCOUNTER — Telehealth: Payer: Self-pay

## 2021-10-05 NOTE — Telephone Encounter (Signed)
Pt no-showed her scheduled 0800 speech therapy appointment.  SLP called pt at her "home" number in the chart to inform her that her appointment this morning was marked a no-show and told her the next scheduled appointment day and time. The number in chart for "mobile" was called initially and pt had not set up voice mail for this number yet.

## 2021-10-09 ENCOUNTER — Ambulatory Visit: Payer: BC Managed Care – PPO | Admitting: Physical Therapy

## 2021-10-10 ENCOUNTER — Ambulatory Visit: Payer: BC Managed Care – PPO

## 2021-10-10 ENCOUNTER — Ambulatory Visit: Payer: BC Managed Care – PPO | Admitting: Occupational Therapy

## 2021-10-10 DIAGNOSIS — R41841 Cognitive communication deficit: Secondary | ICD-10-CM

## 2021-10-10 DIAGNOSIS — R208 Other disturbances of skin sensation: Secondary | ICD-10-CM

## 2021-10-10 DIAGNOSIS — I69353 Hemiplegia and hemiparesis following cerebral infarction affecting right non-dominant side: Secondary | ICD-10-CM

## 2021-10-10 DIAGNOSIS — M6281 Muscle weakness (generalized): Secondary | ICD-10-CM

## 2021-10-10 DIAGNOSIS — R278 Other lack of coordination: Secondary | ICD-10-CM

## 2021-10-10 DIAGNOSIS — I69352 Hemiplegia and hemiparesis following cerebral infarction affecting left dominant side: Secondary | ICD-10-CM | POA: Diagnosis not present
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
## 2021-10-13 ENCOUNTER — Encounter: Payer: BC Managed Care – PPO | Admitting: Occupational Therapy

## 2021-10-16 ENCOUNTER — Ambulatory Visit: Payer: BC Managed Care – PPO | Admitting: Occupational Therapy

## 2021-10-16 ENCOUNTER — Ambulatory Visit: Payer: BC Managed Care – PPO

## 2021-10-16 DIAGNOSIS — R278 Other lack of coordination: Secondary | ICD-10-CM

## 2021-10-16 DIAGNOSIS — I69353 Hemiplegia and hemiparesis following cerebral infarction affecting right non-dominant side: Secondary | ICD-10-CM

## 2021-10-16 DIAGNOSIS — M6281 Muscle weakness (generalized): Secondary | ICD-10-CM

## 2021-10-16 DIAGNOSIS — I69352 Hemiplegia and hemiparesis following cerebral infarction affecting left dominant side: Secondary | ICD-10-CM | POA: Diagnosis not present

## 2021-10-16 DIAGNOSIS — R41841 Cognitive communication deficit: Secondary | ICD-10-CM

## 2021-10-16 DIAGNOSIS — R208 Other disturbances of skin sensation: Secondary | ICD-10-CM
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
## 2021-10-18 ENCOUNTER — Ambulatory Visit: Payer: BC Managed Care – PPO | Admitting: Occupational Therapy

## 2021-10-18 ENCOUNTER — Ambulatory Visit: Payer: BC Managed Care – PPO

## 2021-10-24 ENCOUNTER — Ambulatory Visit: Payer: BC Managed Care – PPO | Attending: Cardiovascular Disease | Admitting: Cardiovascular Disease

## 2021-10-24 ENCOUNTER — Ambulatory Visit: Payer: BC Managed Care – PPO

## 2021-10-24 ENCOUNTER — Encounter: Payer: Self-pay | Admitting: Cardiovascular Disease

## 2021-10-24 VITALS — BP 111/60 | HR 99 | Ht 62.0 in | Wt 205.6 lb

## 2021-10-24 DIAGNOSIS — I2510 Atherosclerotic heart disease of native coronary artery without angina pectoris: Secondary | ICD-10-CM

## 2021-10-24 NOTE — Progress Notes (Unsigned)
G

## 2021-10-26 ENCOUNTER — Ambulatory Visit: Payer: BC Managed Care – PPO

## 2021-10-26 ENCOUNTER — Telehealth: Payer: Self-pay

## 2021-10-26 NOTE — Telephone Encounter (Addendum)
Pt no-showed her second speech therapy (ST) appointment. SLP called pt's mobile number in Epic and left a voicemail informing pt that no-show/cancellation policy which she rec'd on first day of therapy states that all appointments will be cancelled and pt can call and make appointments on an "as you go" basis. SLP encouraged pt to call and schedule ST appointment in the next two weeks. SLP informed Conni that if an appointment is not scheduled after two weeks her chart will be closed.  After that time if she desires ST she can call her PCP or ask her neurologist for a prescription to be sent via Epic, or faxed to [phone].  Garald Balding, MS, SLP [facility] [address] [place], [place] [address]  Ph: [phone]

## 2021-11-01 ENCOUNTER — Ambulatory Visit (INDEPENDENT_AMBULATORY_CARE_PROVIDER_SITE_OTHER): Payer: BC Managed Care – PPO | Admitting: Neurology

## 2021-11-01 ENCOUNTER — Encounter: Payer: Self-pay | Admitting: Neurology

## 2021-11-01 VITALS — HR 96 | Ht 62.0 in | Wt 199.0 lb

## 2021-11-01 DIAGNOSIS — I6381 Other cerebral infarction due to occlusion or stenosis of small artery: Secondary | ICD-10-CM

## 2021-11-01 DIAGNOSIS — I672 Cerebral atherosclerosis: Secondary | ICD-10-CM

## 2021-11-01 DIAGNOSIS — Z8673 Personal history of transient ischemic attack (TIA), and cerebral infarction without residual deficits: Secondary | ICD-10-CM | POA: Diagnosis not present

## 2021-11-01 NOTE — Progress Notes (Signed)
[name] [address] [place]. [place] [address] [phone]       OFFICE FOLLOW-UP NOTE  Ms. Larchmont Date of Birth:  1971/02/04 Medical Record Number:  [id number]   HPI:  Ms.Wilbourne-Kirby is a pleasant 51 year old African-American lady seen today for initial office follow-up visit following hospital admission for stroke in July 2023.  History is obtained from the patient and review of electronic medical records.  I have personally reviewed pertinent available imaging in PACS.DANNETTE KINKAID is a 51 y.o. female past medical of ESRD status post renal transplant on immunosuppression, diabetes, retinal detachment bilateral, hypertension, left thalamic stroke with essentially no residual symptoms (March 2023) presented to the emergency room  on 09/08/2021 for evaluation of sudden onset of tingling and numbness on the right hemibody with a last known well of around noon.  Her husband also provided history and said they went for lunch around 12 PM when she was perfectly normal and then started noticing some tingling and numbness on the right side of her body. Also had paresthesias on left which is new. She reports a sensation being oversensitive on that side.  He drove her to the ER and she was seen and evaluated by the triage PA who activated a code stroke. Initial exam by Dr.Khaliqdina showed a NIH stroke scale-2.Taken for stat MRI because of concern for new stroke versus symptoms being recrudescence of old symptoms on the right and left could be new stroke vs hyperglycemia being stroke mimic.In the ED, fingerstick glucose was greater than 600.Blood pressure was in the normal range MRI scan of the brain shows tiny punctate right external capsule and periventricular white matter lacunar infarcts.  Remote.  Left thalamic lacunar infarct was also noted.  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  Carotid ultrasound showed no significant extracranial stenosis.  LDL cholesterol 48 mg percent.  Hemoglobin A1c was 12.4.  Patient was prior to admission on dual antiplatelet therapy aspirin and Plavix and this was changed to aspirin and Brilinta.  She states she is tolerating it well does get minor bruising but no bleeding episodes.  She states her blood pressure at home is pretty good 130s over 70s but today cannot be checked in the office as she has bilateral arm AV fistula and we cannot use a blood pressure cuff on her arm.  She still has some residual tingling on the face and right hand but it is getting better and is not bothersome.  She states her blood sugars control has been better since discharge.  She is tolerating Crestor well without muscle aches and pains.  She has no new complaints.  She has not had any recurrent new stroke or TIA symptoms. ROS:   14 system review of systems is positive for tingling, numbness, bruising all other systems negative  PMH:  Past Medical History:  Diagnosis Date   Anemia    low iron   Constipation    when given iron   Diabetes mellitus without complication (HCC)    type 2   Diabetic retinopathy ([place])    H/O detached retina repair    bilateral   History of CVA (cerebrovascular accident)    Hypertension    Kidney transplanted    Pneumonia    PVD (peripheral vascular disease) ([place])  Social History:  Social History   Socioeconomic History   Marital status: Married    Spouse name: Not on file   Number of children: Not on file   Years of education: Not on file   Highest education level: Not on file  Occupational History   Not on file  Tobacco Use   Smoking status: Never   Smokeless tobacco: Never  Vaping Use   Vaping Use: Never used  Substance and Sexual Activity   Alcohol use: No     Alcohol/week: 0.0 standard drinks of alcohol   Drug use: No   Sexual activity: Not on file  Other Topics Concern   Not on file  Social History Narrative   L handed   Social Determinants of Health   Financial Resource Strain: Not on file  Food Insecurity: Not on file  Transportation Needs: Not on file  Physical Activity: Not on file  Stress: Not on file  Social Connections: Not on file  Intimate Partner Violence: Not on file    Medications:   Current Outpatient Medications on File Prior to Visit  Medication Sig Dispense Refill   amLODipine (NORVASC) 5 MG tablet Take 5 mg by mouth at bedtime.  11   aspirin 81 MG chewable tablet Chew 1 tablet (81 mg total) by mouth daily. 30 tablet 1   B Complex-C-Folic Acid (RENA-VITE PO) Take 1 tablet by mouth daily.     Continuous Blood Gluc Receiver (DEXCOM G6 RECEIVER) DEVI APPLY ROUTE ONCE     Continuous Blood Gluc Sensor (DEXCOM G6 SENSOR) MISC CHANGE SENSOR EVERY 10 DAYS 3 each 3   Continuous Blood Gluc Transmit (DEXCOM G6 TRANSMITTER) MISC USE TO CHECK BLOOD SUGAR AS DIRECTED 1 each 0   glucose blood (ONETOUCH VERIO) test strip 1 each by Other route 2 (two) times daily. And lancets 2/day 100 each 12   imiquimod (ALDARA) 5 % cream Apply 1 Application topically 2 (two) times a week.  0   Insulin Aspart FlexPen (NOVOLOG) 100 UNIT/ML Inject 16 units with each meal. TDD 48 units.     Insulin Glargine (BASAGLAR KWIKPEN) 100 UNIT/ML Inject 20 Units into the skin 2 (two) times daily. 30 mL 1   Lancets (ONETOUCH DELICA PLUS BMWUXL24M) MISC USE TWICE A DAY TO CHECK BLOOD SUGAR     levonorgestrel (MIRENA) 20 MCG/24HR IUD 1 each by Intrauterine route once.     MM PEN NEEDLES 31G X 5 MM MISC USE  IN THE MORNING AND AT BEDTIME 100 each 0   mycophenolate (CELLCEPT) 250 MG capsule Take 750 mg by mouth daily. Take 2 capsules (500 mg total) by mouth once daily AND 1 capsule (250 mg total) at bedtime.     NOVOLOG FLEXPEN 100 UNIT/ML FlexPen Inject into the  skin.     omeprazole (PRILOSEC OTC) 20 MG tablet Take by mouth.     OZEMPIC, 1 MG/DOSE, 4 MG/3ML SOPN Inject 1 mg into the skin once a week.     polyethylene glycol (MIRALAX / GLYCOLAX) packet Take 17 g by mouth daily as needed for mild constipation.     predniSONE (DELTASONE) 5 MG tablet Take 5 mg by mouth daily with breakfast.     rosuvastatin (CRESTOR) 20 MG tablet Take 1 tablet (20 mg total) by mouth daily. 90 tablet 0   sodium bicarbonate 650 MG tablet Take 1,300 mg by mouth 2 (two) times daily.     tacrolimus (PROGRAF) 1 MG capsule Take 4 mg by mouth 2 (two)  times daily.     ticagrelor (BRILINTA) 90 MG TABS tablet Take 1 tablet (90 mg total) by mouth 2 (two) times daily. 60 tablet 2   No current facility-administered medications on file prior to visit.    Allergies:   Allergies  Allergen Reactions   Fluorescein Shortness Of Breath and Other (See Comments)    Hypertension   Iodinated Contrast Media Anaphylaxis, Hives and Other (See Comments)    Diffuse swelling   Iohexol Anaphylaxis, Hives, Itching, Swelling and Other (See Comments)    13 HR PRE-MEDS REQUIRED   Latex Itching   Acetaminophen     Other reaction(s): Other (See Comments) CANNOT TAKE DUE TO KIDNEY  CANNOT TAKE DUE TO KIDNEY  CANNOT TAKE DUE TO KIDNEY   Egg White (Egg Protein)     Other reaction(s): Other (See Comments) CANNOT TAKE DUE TO AUTO IMMUNE DISORDER  CANNOT TAKE DUE TO AUTO IMMUNE DISORDER  CANNOT TAKE DUE TO AUTO IMMUNE DISORDER    Nsaids Other (See Comments)    CANNOT TAKE DUE TO KIDNEY  CANNOT TAKE DUE TO KIDNEY  CANNOT TAKE DUE TO KIDNEY    Shellfish Allergy     Other reaction(s): Other (See Comments) CANNOT TAKE DUE TO KIDNEY TRANSPLANT  CANNOT TAKE DUE TO KIDNEY TRANSPLANT  CANNOT TAKE DUE TO KIDNEY TRANSPLANT    Lactose Nausea Only    Other reaction(s): Other (See Comments)    Physical Exam General: Obese middle-aged African-American lady, seated, in no evident distress Head: head  normocephalic and atraumatic.  Neck: supple with no carotid or supraclavicular bruits Cardiovascular: regular rate and rhythm, no murmurs Musculoskeletal: no deformity Skin:  no rash .petichiae left forearm Vascular:  Normal pulses all extremities Vitals:   11/01/21 1047  Pulse: 96   Neurologic Exam Mental Status: Awake and fully alert. Oriented to place and time. Recent and remote memory intact. Attention span, concentration and fund of knowledge appropriate. Mood and affect appropriate.  Cranial Nerves: Fundoscopic exam reveals sharp disc margins. Pupils equal, briskly reactive to light. Extraocular movements full without nystagmus.  Right eye exotropia.  Visual fields full to confrontation. Hearing intact. Facial sensation intact. Face, tongue, palate moves normally and symmetrically.  Motor: Normal bulk and tone. Normal strength in all tested extremity muscles. Sensory.: intact to touch ,pinprick .position and vibratory sensation.  Subjective paresthesias on the right cheek and right fingertips. Coordination: Rapid alternating movements normal in all extremities. Finger-to-nose and heel-to-shin performed accurately bilaterally. Gait and Station: Arises from chair without difficulty. Stance is normal. Gait demonstrates normal stride length and balance . Able to heel, toe and tandem walk with moderate difficulty.  Reflexes: 1+ and symmetric. Toes downgoing.   NIHSS  0 Modified Rankin  1   ASSESSMENT: 51 year old lady with acute punctate right external capsule and periventricular frontal white matter infarcts likely from lacunar disease in the setting of dehydration from hyperglycemia and background of severe multifocal intracranial atherosclerotic disease.  Vascular risk factors diabetes, hypertension, hyperlipidemia , obesity and diffuse intracranial therosclerotic disease.     PLAN: I had a long d/w patient about her  recent lacunar strokes, intracranial atherosclerosis,risk for  recurrent stroke/TIAs, personally independently reviewed imaging studies and stroke evaluation results and answered questions.Continue aspirin 81 mg daily and Brilinta (ticagrelor) 90 mg bid  for secondary stroke and cardiac prevention and maintain strict control of hypertension with blood pressure goal below 130/90, diabetes with hemoglobin A1c goal below 6.5% and lipids with LDL cholesterol goal below 70 mg/dL. I also  advised the patient to eat a healthy diet with plenty of whole grains, cereals, fruits and vegetables, exercise regularly and maintain ideal body weight . Patient is neurologically cleared to start cardiac rehab.Follow up in the future with my nurse practitioner in 6 months or call earlier if necessary. Greater than 50% of time during this 35 minute visit was spent on counseling,explanation of diagnosis of lacunar strokes, intracranial atherosclerosis,, planning of further management, discussion with patient and family and coordination of care Antony Contras, MD Note: This document was prepared with digital dictation and possible smart phrase technology. Any transcriptional errors that result from this process are unintentional

## 2021-11-01 NOTE — Patient Instructions (Signed)
I had a long d/w patient about her  recent lacunar strokes, intracranial atherosclerosis,risk for recurrent stroke/TIAs, personally independently reviewed imaging studies and stroke evaluation results and answered questions.Continue aspirin 81 mg daily and Brilinta (ticagrelor) 90 mg bid  for secondary stroke and cardiac prevention and maintain strict control of hypertension with blood pressure goal below 130/90, diabetes with hemoglobin A1c goal below 6.5% and lipids with LDL cholesterol goal below 70 mg/dL. I also advised the patient to eat a healthy diet with plenty of whole grains, cereals, fruits and vegetables, exercise regularly and maintain ideal body weight . Patient is neurologically cleared to start cardiac rehab.Follow up in the future with my nurse practitioner in 6 months or call earlier if necessary.  Stroke Prevention Some medical conditions and behaviors can lead to a higher chance of having a stroke. You can help prevent a stroke by eating healthy, exercising, not smoking, and managing any medical conditions you have. Stroke is a leading cause of functional impairment. Primary prevention is particularly important because a majority of strokes are first-time events. Stroke changes the lives of not only those who experience a stroke but also their family and other caregivers. How can this condition affect me? A stroke is a medical emergency and should be treated right away. A stroke can lead to brain damage and can sometimes be life-threatening. If a person gets medical treatment right away, there is a better chance of surviving and recovering from a stroke. What can increase my risk? The following medical conditions may increase your risk of a stroke: Cardiovascular disease. High blood pressure (hypertension). Diabetes. High cholesterol. Sickle cell disease. Blood clotting disorders (hypercoagulable state). Obesity. Sleep disorders (obstructive sleep apnea). Other risk factors  include: Being older than age 24. Having a history of blood clots, stroke, or mini-stroke (transient ischemic attack, TIA). Genetic factors, such as race, ethnicity, or a family history of stroke. Smoking cigarettes or using other tobacco products. Taking birth control pills, especially if you also use tobacco. Heavy use of alcohol or drugs, especially cocaine and methamphetamine. Physical inactivity. What actions can I take to prevent this? Manage your health conditions High cholesterol levels. Eating a healthy diet is important for preventing high cholesterol. If cholesterol cannot be managed through diet alone, you may need to take medicines. Take any prescribed medicines to control your cholesterol as told by your health care provider. Hypertension. To reduce your risk of stroke, try to keep your blood pressure below 130/80. Eating a healthy diet and exercising regularly are important for controlling blood pressure. If these steps are not enough to manage your blood pressure, you may need to take medicines. Take any prescribed medicines to control hypertension as told by your health care provider. Ask your health care provider if you should monitor your blood pressure at home. Have your blood pressure checked every year, even if your blood pressure is normal. Blood pressure increases with age and some medical conditions. Diabetes. Eating a healthy diet and exercising regularly are important parts of managing your blood sugar (glucose). If your blood sugar cannot be managed through diet and exercise, you may need to take medicines. Take any prescribed medicines to control your diabetes as told by your health care provider. Get evaluated for obstructive sleep apnea. Talk to your health care provider about getting a sleep evaluation if you snore a lot or have excessive sleepiness. Make sure that any other medical conditions you have, such as atrial fibrillation or atherosclerosis, are  managed. Nutrition Follow  instructions from your health care provider about what to eat or drink to help manage your health condition. These instructions may include: Reducing your daily calorie intake. Limiting how much salt (sodium) you use to 1,500 milligrams (mg) each day. Using only healthy fats for cooking, such as olive oil, canola oil, or sunflower oil. Eating healthy foods. You can do this by: Choosing foods that are high in fiber, such as whole grains, and fresh fruits and vegetables. Eating at least 5 servings of fruits and vegetables a day. Try to fill one-half of your plate with fruits and vegetables at each meal. Choosing lean protein foods, such as lean cuts of meat, poultry without skin, fish, tofu, beans, and nuts. Eating low-fat dairy products. Avoiding foods that are high in sodium. This can help lower blood pressure. Avoiding foods that have saturated fat, trans fat, and cholesterol. This can help prevent high cholesterol. Avoiding processed and prepared foods. Counting your daily carbohydrate intake.  Lifestyle If you drink alcohol: Limit how much you have to: 0-1 drink a day for women who are not pregnant. 0-2 drinks a day for men. Know how much alcohol is in your drink. In the [place] one drink equals one 12 oz bottle of beer (389m), one 5 oz glass of wine (1465m, or one 1 oz glass of hard liquor (4414m Do not use any products that contain nicotine or tobacco. These products include cigarettes, chewing tobacco, and vaping devices, such as e-cigarettes. If you need help quitting, ask your health care provider. Avoid secondhand smoke. Do not use drugs. Activity  Try to stay at a healthy weight. Get at least 30 minutes of exercise on most days, such as: Fast walking. Biking. Swimming. Medicines Take over-the-counter and prescription medicines only as told by your health care provider. Aspirin or blood thinners (antiplatelets or anticoagulants) may be recommended  to reduce your risk of forming blood clots that can lead to stroke. Avoid taking birth control pills. Talk to your health care provider about the risks of taking birth control pills if: You are over 35 52ars old. You smoke. You get very bad headaches. You have had a blood clot. Where to find more information American Stroke Association: www.strokeassociation.org Get help right away if: You or a loved one has any symptoms of a stroke. "BE FAST" is an easy way to remember the main warning signs of a stroke: B - Balance. Signs are dizziness, sudden trouble walking, or loss of balance. E - Eyes. Signs are trouble seeing or a sudden change in vision. F - Face. Signs are sudden weakness or numbness of the face, or the face or eyelid drooping on one side. A - Arms. Signs are weakness or numbness in an arm. This happens suddenly and usually on one side of the body. S - Speech. Signs are sudden trouble speaking, slurred speech, or trouble understanding what people say. T - Time. Time to call emergency services. Write down what time symptoms started. You or a loved one has other signs of a stroke, such as: A sudden, severe headache with no known cause. Nausea or vomiting. Seizure. These symptoms may represent a serious problem that is an emergency. Do not wait to see if the symptoms will go away. Get medical help right away. Call your local emergency services (911 in the [place] Do not drive yourself to the hospital. Summary You can help to prevent a stroke by eating healthy, exercising, not smoking, limiting alcohol intake, and managing any medical conditions  you may have. Do not use any products that contain nicotine or tobacco. These include cigarettes, chewing tobacco, and vaping devices, such as e-cigarettes. If you need help quitting, ask your health care provider. Remember "BE FAST" for warning signs of a stroke. Get help right away if you or a loved one has any of these signs. This information  is not intended to replace advice given to you by your health care provider. Make sure you discuss any questions you have with your health care provider. Document Revised: 09/14/2019 Document Reviewed: 09/14/2019 Elsevier Patient Education  [place].

## 2021-11-03 ENCOUNTER — Ambulatory Visit: Payer: BC Managed Care – PPO

## 2021-11-08 ENCOUNTER — Ambulatory Visit: Payer: BC Managed Care – PPO | Admitting: Cardiovascular Disease

## 2021-11-14 ENCOUNTER — Ambulatory Visit: Payer: BC Managed Care – PPO | Admitting: Family Medicine

## 2021-11-21 ENCOUNTER — Encounter (HOSPITAL_COMMUNITY): Payer: Self-pay

## 2021-11-21 ENCOUNTER — Encounter (HOSPITAL_COMMUNITY)
Admission: RE | Admit: 2021-11-21 | Discharge: 2021-11-21 | Disposition: A | Discharge status: Home / Self Care | Payer: BC Managed Care – PPO | Source: Ambulatory Visit | Attending: Cardiology | Admitting: Cardiology

## 2021-11-21 VITALS — BP 148/84 | HR 94 | Ht 62.0 in | Wt 201.9 lb

## 2021-11-21 DIAGNOSIS — Z955 Presence of coronary angioplasty implant and graft: Secondary | ICD-10-CM | POA: Insufficient documentation

## 2021-11-21 HISTORY — DX: Hyperlipidemia, unspecified: E78.5

## 2021-11-21 HISTORY — DX: Atherosclerotic heart disease of native coronary artery without angina pectoris: I25.10

## 2021-11-21 LAB — GLUCOSE, CAPILLARY: Glucose-Capillary: 288 mg/dL — ABNORMAL HIGH (ref 70–99)

## 2021-11-21 NOTE — Progress Notes (Signed)
Cardiac Individual Treatment Plan  Patient Details  Name: Morgan [name] MRN: [id number] Date of Birth: 1970-03-28 Referring Provider:   Flowsheet Row INTENSIVE CARDIAC REHAB ORIENT from 11/21/2021 in [place]  Referring Provider Dr Iona Beard Adam's MD/ Dr Fransico Him, MD, covering       Initial Encounter Date:  [place] from 11/21/2021 in [place]  Date 11/21/21       Visit Diagnosis: 08/24/21 DES LAD & CX, PTCA diagonal at [facility]  Patient's Home Medications on Admission:  Current Outpatient Medications:    amLODipine (NORVASC) 5 MG tablet, Take 5 mg by mouth at bedtime., Disp: , Rfl: 11   aspirin 81 MG chewable tablet, Chew 1 tablet (81 mg total) by mouth daily., Disp: 30 tablet, Rfl: 1   B Complex-C-Folic Acid (RENA-VITE PO), Take 1 tablet by mouth daily., Disp: , Rfl:    Insulin Aspart FlexPen (NOVOLOG) 100 UNIT/ML, Inject 16 units with each meal. TDD 48 units., Disp: , Rfl:    Insulin Glargine (BASAGLAR KWIKPEN) 100 UNIT/ML, Inject 20 Units into the skin 2 (two) times daily., Disp: 30 mL, Rfl: 1   levonorgestrel (MIRENA) 20 MCG/24HR IUD, 1 each by Intrauterine route once., Disp: , Rfl:    MM PEN NEEDLES 31G X 5 MM MISC, USE  IN THE MORNING AND AT BEDTIME, Disp: 100 each, Rfl: 0   mycophenolate (CELLCEPT) 250 MG capsule, Take 750 mg by mouth daily. Take 2 capsules (500 mg total) by mouth once daily AND 1 capsule (250 mg total) at bedtime., Disp: , Rfl:    omeprazole (PRILOSEC OTC) 20 MG tablet, Take by mouth., Disp: , Rfl:    predniSONE (DELTASONE) 5 MG tablet, Take 5 mg by mouth daily with breakfast., Disp: , Rfl:    rosuvastatin (CRESTOR) 20 MG tablet, Take 1 tablet (20 mg total) by mouth daily., Disp: 90 tablet, Rfl: 0   sodium bicarbonate 650 MG tablet, Take 1,300 mg by mouth 2 (two) times daily., Disp: , Rfl:    tacrolimus (PROGRAF) 1 MG capsule, Take 4 mg by mouth 2  (two) times daily., Disp: , Rfl:    ticagrelor (BRILINTA) 90 MG TABS tablet, Take 1 tablet (90 mg total) by mouth 2 (two) times daily., Disp: 60 tablet, Rfl: 2   Continuous Blood Gluc Receiver ([place]) DEVI, [place] (Patient not taking: Reported on 11/21/2021), Disp: , Rfl:    Continuous Blood Gluc Sensor (DEXCOM G6 SENSOR) MISC, CHANGE SENSOR EVERY 10 DAYS (Patient not taking: Reported on 11/21/2021), Disp: 3 each, Rfl: 3   Continuous Blood Gluc Transmit (DEXCOM G6 TRANSMITTER) MISC, USE TO CHECK BLOOD SUGAR AS DIRECTED (Patient not taking: Reported on 11/21/2021), Disp: 1 each, Rfl: 0   folic acid (FOLVITE) 1 MG tablet, Take 1 mg by mouth daily., Disp: , Rfl:    glucose blood (ONETOUCH VERIO) test strip, 1 each by Other route 2 (two) times daily. And lancets 2/day (Patient not taking: Reported on 11/21/2021), Disp: 100 each, Rfl: 12   Lancets (ONETOUCH DELICA PLUS [id number]) MISC, USE TWICE A DAY TO CHECK BLOOD SUGAR (Patient not taking: Reported on 11/21/2021), Disp: , Rfl:    OZEMPIC, 1 MG/DOSE, 4 MG/3ML SOPN, Inject 1 mg into the skin once a week. (Patient not taking: Reported on 11/21/2021), Disp: , Rfl:   Past Medical History: Past Medical History:  Diagnosis Date   Anemia    low iron  Constipation    when given iron   Coronary artery disease    Diabetes mellitus without complication (HCC)    type 2   Diabetic retinopathy ([place])    H/O detached retina repair    bilateral   History of CVA (cerebrovascular accident)    Hyperlipidemia    Hypertension    Kidney transplanted    Pneumonia    PVD (peripheral vascular disease) ([place])    S/P drug eluting coronary stent placement 08/24/2021   AT [facility] Dr Iona Beard Adam's    Tobacco Use: Social History   Tobacco Use  Smoking Status Never  Smokeless Tobacco Never    Labs: Review Flowsheet  More data exists      Latest Ref Rng & Units 03/04/2019 07/10/2019 05/13/2021 09/08/2021 09/09/2021  Labs for ITP Cardiac and Pulmonary  Rehab  Cholestrol 0 - 200 mg/dL - - 124  - 119   LDL (calc) 0 - 99 mg/dL - - 76  - 48   HDL-C >40 mg/dL - - 37  - 34   Trlycerides <150 mg/dL - - 54  - 183   Hemoglobin A1c 4.8 - 5.6 % 12.0  7.8  10.2  - 12.4   TCO2 22 - 32 mmol/L - - - 18  -    Capillary Blood Glucose: Lab Results  Component Value Date   GLUCAP 288 (H) 11/21/2021   GLUCAP 148 (H) 09/11/2021   GLUCAP 273 (H) 09/10/2021   GLUCAP 317 (H) 09/10/2021   GLUCAP 284 (H) 09/10/2021     Exercise Target Goals: Exercise Program Goal: Individual exercise prescription set using results from initial 6 min walk test and THRR while considering  patient's activity barriers and safety.   Exercise Prescription Goal: Initial exercise prescription builds to 30-45 minutes a day of aerobic activity, 2-3 days per week.  Home exercise guidelines will be given to patient during program as part of exercise prescription that the participant will acknowledge.  Activity Barriers & Risk Stratification:  Activity Barriers & Cardiac Risk Stratification - 11/21/21 1354       Activity Barriers & Cardiac Risk Stratification   Activity Barriers Deconditioning;Muscular Weakness;Balance Concerns    Cardiac Risk Stratification High             6 Minute Walk:  6 Minute Walk     Row Name 11/21/21 1351         6 Minute Walk   Phase Initial  Done on Nustep     Distance 951 feet     Walk Time 6 minutes     # of Rest Breaks 0     MPH 1.8     METS 2.79     RPE 6     Perceived Dyspnea  0     VO2 Peak 9.8     Symptoms No     Resting HR 94 bpm     Resting BP 148/84  taken on calf     Resting Oxygen Saturation  100 %     Exercise Oxygen Saturation  during 6 min walk 98 %     Max Ex. HR 94 bpm     Max Ex. BP 151/58  Taken on thigh     2 Minute Post BP 156/64  Taken on thigh              Oxygen Initial Assessment:   Oxygen Re-Evaluation:   Oxygen Discharge (Final Oxygen Re-Evaluation):   Initial Exercise Prescription:   Initial Exercise  Prescription - 11/21/21 1300       Date of Initial Exercise RX and Referring Provider   Date 11/21/21    Referring Provider Dr Avie Echevaria MD/ Dr Fransico Him, MD, covering    Expected Discharge Date 01/26/22      NuStep   Level 1    SPM 70    Minutes 30    METs 2.7      Prescription Details   Frequency (times per week) 3    Duration Progress to 30 minutes of continuous aerobic without signs/symptoms of physical distress      Intensity   THRR 40-80% of Max Heartrate 67-134    Ratings of Perceived Exertion 11-13    Perceived Dyspnea 0-4      Progression   Progression Continue progressive overload as per policy without signs/symptoms or physical distress.      Resistance Training   Training Prescription Yes    Weight 2 lbs    Reps 10-15             Perform Capillary Blood Glucose checks as needed.  Exercise Prescription Changes:   Exercise Comments:   Exercise Goals and Review:   Exercise Goals     Row Name 11/21/21 1355             Exercise Goals   Increase Physical Activity Yes       Intervention Provide advice, education, support and counseling about physical activity/exercise needs.;Develop an individualized exercise prescription for aerobic and resistive training based on initial evaluation findings, risk stratification, comorbidities and participant's personal goals.       Expected Outcomes Short Term: Attend rehab on a regular basis to increase amount of physical activity.;Long Term: Add in home exercise to make exercise part of routine and to increase amount of physical activity.;Long Term: Exercising regularly at least 3-5 days a week.       Increase Strength and Stamina Yes       Intervention Provide advice, education, support and counseling about physical activity/exercise needs.;Develop an individualized exercise prescription for aerobic and resistive training based on initial evaluation findings, risk stratification,  comorbidities and participant's personal goals.       Expected Outcomes Short Term: Increase workloads from initial exercise prescription for resistance, speed, and METs.;Short Term: Perform resistance training exercises routinely during rehab and add in resistance training at home;Long Term: Improve cardiorespiratory fitness, muscular endurance and strength as measured by increased METs and functional capacity (6MWT)       Able to understand and use rate of perceived exertion (RPE) scale Yes       Intervention Provide education and explanation on how to use RPE scale       Expected Outcomes Short Term: Able to use RPE daily in rehab to express subjective intensity level;Long Term:  Able to use RPE to guide intensity level when exercising independently       Knowledge and understanding of Target Heart Rate Range (THRR) Yes       Intervention Provide education and explanation of THRR including how the numbers were predicted and where they are located for reference       Expected Outcomes Short Term: Able to state/look up THRR;Short Term: Able to use daily as guideline for intensity in rehab;Long Term: Able to use THRR to govern intensity when exercising independently       Understanding of Exercise Prescription Yes       Intervention Provide education, explanation, and written materials on patient's individual  exercise prescription       Expected Outcomes Short Term: Able to explain program exercise prescription;Long Term: Able to explain home exercise prescription to exercise independently                Exercise Goals Re-Evaluation :   Discharge Exercise Prescription (Final Exercise Prescription Changes):   Nutrition:  Target Goals: Understanding of nutrition guidelines, daily intake of sodium 1500mg , cholesterol 200mg , calories 30% from fat and 7% or less from saturated fats, daily to have 5 or more servings of fruits and vegetables.  Biometrics:  Pre Biometrics - 11/21/21 0835        Pre Biometrics   Waist Circumference 49 inches    Hip Circumference 51.5 inches    Waist to Hip Ratio 0.95 %    Triceps Skinfold 32 mm    % Body Fat 49 %    Grip Strength 22 kg    Flexibility 0 in    Single Leg Stand 1.52 seconds              Nutrition Therapy Plan and Nutrition Goals:   Nutrition Assessments:  MEDIFICTS Score Key: ?70 Need to make dietary changes  40-70 Heart Healthy Diet ? 40 Therapeutic Level Cholesterol Diet    Picture Your Plate Scores: <62 Unhealthy dietary pattern with much room for improvement. 41-50 Dietary pattern unlikely to meet recommendations for good health and room for improvement. 51-60 More healthful dietary pattern, with some room for improvement.  >60 Healthy dietary pattern, although there may be some specific behaviors that could be improved.    Nutrition Goals Re-Evaluation:   Nutrition Goals Re-Evaluation:   Nutrition Goals Discharge (Final Nutrition Goals Re-Evaluation):   Psychosocial: Target Goals: Acknowledge presence or absence of significant depression and/or stress, maximize coping skills, provide positive support system. Participant is able to verbalize types and ability to use techniques and skills needed for reducing stress and depression.  Initial Review & Psychosocial Screening:  Initial Psych Review & Screening - 11/21/21 1636       Initial Review   Current issues with Current Stress Concerns    Source of Stress Concerns Chronic Illness    Comments Morgan [name] has health related stress as she is reovering from a recent [place]? Yes   Morgan [name] has her husband and daughter for support     Barriers   Psychosocial barriers to participate in program The patient should benefit from training in stress management and relaxation.      Screening Interventions   Interventions Encouraged to exercise    Expected Outcomes Long Term Goal: Stressors or current issues are controlled  or eliminated.             Quality of Life Scores:  Quality of Life - 11/21/21 1350       Quality of Life   Select Quality of Life      Quality of Life Scores   Health/Function Pre 22.53 %    Socioeconomic Pre 24 %    Psych/Spiritual Pre 24 %    Family Pre 28.8 %    GLOBAL Pre 24.06 %            Scores of 19 and below usually indicate a poorer quality of life in these areas.  A difference of  2-3 points is a clinically meaningful difference.  A difference of 2-3 points in the total score of the Quality of Life Index has  been associated with significant improvement in overall quality of life, self-image, physical symptoms, and general health in studies assessing change in quality of life.  PHQ-9: Review Flowsheet       11/21/2021 01/11/2017 10/06/2014  Depression screen PHQ 2/9  Decreased Interest 0 0 0  Down, Depressed, Hopeless 0 0 0  PHQ - 2 Score 0 0 0   Interpretation of Total Score  Total Score Depression Severity:  1-4 = Minimal depression, 5-9 = Mild depression, 10-14 = Moderate depression, 15-19 = Moderately severe depression, 20-27 = Severe depression   Psychosocial Evaluation and Intervention:   Psychosocial Re-Evaluation:   Psychosocial Discharge (Final Psychosocial Re-Evaluation):   Vocational Rehabilitation: Provide vocational rehab assistance to qualifying candidates.   Vocational Rehab Evaluation & Intervention:  Vocational Rehab - 11/21/21 1639       Initial Vocational Rehab Evaluation & Intervention   Assessment shows need for Vocational Rehabilitation No   Morgan [name] says that she is working and does not need vocational rehab at this time            Education: Education Goals: Education classes will be provided on a weekly basis, covering required topics. Participant will state understanding/return demonstration of topics presented.     Core Videos: Exercise    Move It!  Clinical staff conducted group or individual video  education with verbal and written material and guidebook.  Patient learns the recommended Pritikin exercise program. Exercise with the goal of living a long, healthy life. Some of the health benefits of exercise include controlled diabetes, healthier blood pressure levels, improved cholesterol levels, improved heart and lung capacity, improved sleep, and better body composition. Everyone should speak with their doctor before starting or changing an exercise routine.  Biomechanical Limitations Clinical staff conducted group or individual video education with verbal and written material and guidebook.  Patient learns how biomechanical limitations can impact exercise and how we can mitigate and possibly overcome limitations to have an impactful and balanced exercise routine.  Body Composition Clinical staff conducted group or individual video education with verbal and written material and guidebook.  Patient learns that body composition (ratio of muscle mass to fat mass) is a key component to assessing overall fitness, rather than body weight alone. Increased fat mass, especially visceral belly fat, can put [place] at increased risk for metabolic syndrome, type 2 diabetes, heart disease, and even death. It is recommended to combine diet and exercise (cardiovascular and resistance training) to improve your body composition. Seek guidance from your physician and exercise physiologist before implementing an exercise routine.  Exercise Action Plan Clinical staff conducted group or individual video education with verbal and written material and guidebook.  Patient learns the recommended strategies to achieve and enjoy long-term exercise adherence, including variety, self-motivation, self-efficacy, and positive decision making. Benefits of exercise include fitness, good health, weight management, more energy, better sleep, less stress, and overall well-being.  Medical   Heart Disease Risk Reduction Clinical staff  conducted group or individual video education with verbal and written material and guidebook.  Patient learns our heart is our most vital organ as it circulates oxygen, nutrients, white blood cells, and hormones throughout the entire body, and carries waste away. Data supports a plant-based eating plan like the Pritikin Program for its effectiveness in slowing progression of and reversing heart disease. The video provides a number of recommendations to address heart disease.   Metabolic Syndrome and Belly Fat  Clinical staff conducted group or individual video education with verbal and  written material and guidebook.  Patient learns what metabolic syndrome is, how it leads to heart disease, and how one can reverse it and keep it from coming back. You have metabolic syndrome if you have 3 of the following 5 criteria: abdominal obesity, high blood pressure, high triglycerides, low HDL cholesterol, and high blood sugar.  Hypertension and Heart Disease Clinical staff conducted group or individual video education with verbal and written material and guidebook.  Patient learns that high blood pressure, or hypertension, is very common in the [place]. Hypertension is largely due to excessive salt intake, but other important risk factors include being overweight, physical inactivity, drinking too much alcohol, smoking, and not eating enough potassium from fruits and vegetables. High blood pressure is a leading risk factor for heart attack, stroke, congestive heart failure, dementia, kidney failure, and premature death. Long-term effects of excessive salt intake include stiffening of the arteries and thickening of heart muscle and organ damage. Recommendations include ways to reduce hypertension and the risk of heart disease.  Diseases of Our Time - Focusing on Diabetes Clinical staff conducted group or individual video education with verbal and written material and guidebook.  Patient learns why the best  way to stop diseases of our time is prevention, through food and other lifestyle changes. Medicine (such as prescription pills and surgeries) is often only a Band-Aid on the problem, not a long-term solution. Most common diseases of our time include obesity, type 2 diabetes, hypertension, heart disease, and cancer. The Pritikin Program is recommended and has been proven to help reduce, reverse, and/or prevent the damaging effects of metabolic syndrome.  Nutrition   Overview of the Pritikin Eating Plan  Clinical staff conducted group or individual video education with verbal and written material and guidebook.  Patient learns about the [place] for disease risk reduction. The [place] emphasizes a wide variety of unrefined, minimally-processed carbohydrates, like fruits, vegetables, whole grains, and legumes. Go, Caution, and Stop food choices are explained. Plant-based and lean animal proteins are emphasized. Rationale provided for low sodium intake for blood pressure control, low added sugars for blood sugar stabilization, and low added fats and oils for coronary artery disease risk reduction and weight management.  Calorie Density  Clinical staff conducted group or individual video education with verbal and written material and guidebook.  Patient learns about calorie density and how it impacts the Pritikin Eating Plan. Knowing the characteristics of the food you choose will help you decide whether those foods will lead to weight gain or weight loss, and whether you want to consume more or less of them. Weight loss is usually a side effect of the Pritikin Eating Plan because of its focus on low calorie-dense foods.  Label Reading  Clinical staff conducted group or individual video education with verbal and written material and guidebook.  Patient learns about the Pritikin recommended label reading guidelines and corresponding recommendations regarding calorie density, added  sugars, sodium content, and whole grains.  Dining Out - Part 1  Clinical staff conducted group or individual video education with verbal and written material and guidebook.  Patient learns that restaurant meals can be sabotaging because they can be so high in calories, fat, sodium, and/or sugar. Patient learns recommended strategies on how to positively address this and avoid unhealthy pitfalls.  Facts on Fats  Clinical staff conducted group or individual video education with verbal and written material and guidebook.  Patient learns that lifestyle modifications can be just as effective,  if not more so, as many medications for lowering your risk of heart disease. A Pritikin lifestyle can help to reduce your risk of inflammation and atherosclerosis (cholesterol build-up, or plaque, in the artery walls). Lifestyle interventions such as dietary choices and physical activity address the cause of atherosclerosis. A review of the types of fats and their impact on blood cholesterol levels, along with dietary recommendations to reduce fat intake is also included.  Nutrition Action Plan  Clinical staff conducted group or individual video education with verbal and written material and guidebook.  Patient learns how to incorporate Pritikin recommendations into their lifestyle. Recommendations include planning and keeping personal health goals in mind as an important part of their success.  Healthy Mind-Set    Healthy Minds, Bodies, Hearts  Clinical staff conducted group or individual video education with verbal and written material and guidebook.  Patient learns how to identify when they are stressed. Video will discuss the impact of that stress, as well as the many benefits of stress management. Patient will also be introduced to stress management techniques. The way we think, act, and feel has an impact on our hearts.  How Our Thoughts Can Heal Our Hearts  Clinical staff conducted group or individual  video education with verbal and written material and guidebook.  Patient learns that negative thoughts can cause depression and anxiety. This can result in negative lifestyle behavior and serious health problems. Cognitive behavioral therapy is an effective method to help control our thoughts in order to change and improve our emotional outlook.  Additional Videos:  Exercise    Improving Performance  Clinical staff conducted group or individual video education with verbal and written material and guidebook.  Patient learns to use a non-linear approach by alternating intensity levels and lengths of time spent exercising to help burn more calories and lose more body fat. Cardiovascular exercise helps improve heart health, metabolism, hormonal balance, blood sugar control, and recovery from fatigue. Resistance training improves strength, endurance, balance, coordination, reaction time, metabolism, and muscle mass. Flexibility exercise improves circulation, posture, and balance. Seek guidance from your physician and exercise physiologist before implementing an exercise routine and learn your capabilities and proper form for all exercise.  Introduction to Yoga  Clinical staff conducted group or individual video education with verbal and written material and guidebook.  Patient learns about yoga, a discipline of the coming together of mind, breath, and body. The benefits of yoga include improved flexibility, improved range of motion, better posture and core strength, increased lung function, weight loss, and positive self-image. Yoga's heart health benefits include lowered blood pressure, healthier heart rate, decreased cholesterol and triglyceride levels, improved immune function, and reduced stress. Seek guidance from your physician and exercise physiologist before implementing an exercise routine and learn your capabilities and proper form for all exercise.  Medical   Aging: Enhancing Your Quality of  Life  Clinical staff conducted group or individual video education with verbal and written material and guidebook.  Patient learns key strategies and recommendations to stay in good physical health and enhance quality of life, such as prevention strategies, having an advocate, securing a [place], and keeping a list of medications and system for tracking them. It also discusses how to avoid risk for bone loss.  Biology of Weight Control  Clinical staff conducted group or individual video education with verbal and written material and guidebook.  Patient learns that weight gain occurs because we consume more calories than we  burn (eating more, moving less). Even if your body weight is normal, you may have higher ratios of fat compared to muscle mass. Too much body fat puts you at increased risk for cardiovascular disease, heart attack, stroke, type 2 diabetes, and obesity-related cancers. In addition to exercise, following the [place] can help reduce your risk.  Decoding Lab Results  Clinical staff conducted group or individual video education with verbal and written material and guidebook.  Patient learns that lab test reflects one measurement whose values change over time and are influenced by many factors, including medication, stress, sleep, exercise, food, hydration, pre-existing medical conditions, and more. It is recommended to use the knowledge from this video to become more involved with your lab results and evaluate your numbers to speak with your doctor.   Diseases of Our Time - Overview  Clinical staff conducted group or individual video education with verbal and written material and guidebook.  Patient learns that according to the CDC, 50% to 70% of chronic diseases (such as obesity, type 2 diabetes, elevated lipids, hypertension, and heart disease) are avoidable through lifestyle improvements including healthier food choices, listening to  satiety cues, and increased physical activity.  Sleep Disorders Clinical staff conducted group or individual video education with verbal and written material and guidebook.  Patient learns how good quality and duration of sleep are important to overall health and well-being. Patient also learns about sleep disorders and how they impact health along with recommendations to address them, including discussing with a physician.  Nutrition  Dining Out - Part 2 Clinical staff conducted group or individual video education with verbal and written material and guidebook.  Patient learns how to plan ahead and communicate in order to maximize their dining experience in a healthy and nutritious manner. Included are recommended food choices based on the type of restaurant the patient is visiting.   Fueling a Best boy conducted group or individual video education with verbal and written material and guidebook.  There is a strong connection between our food choices and our health. Diseases like obesity and type 2 diabetes are very prevalent and are in large-part due to lifestyle choices. The Pritikin Eating Plan provides plenty of food and hunger-curbing satisfaction. It is easy to follow, affordable, and helps reduce health risks.  Menu Workshop  Clinical staff conducted group or individual video education with verbal and written material and guidebook.  Patient learns that restaurant meals can sabotage health goals because they are often packed with calories, fat, sodium, and sugar. Recommendations include strategies to plan ahead and to communicate with the manager, chef, or server to help order a healthier meal.  Planning Your Eating Strategy  Clinical staff conducted group or individual video education with verbal and written material and guidebook.  Patient learns about the [place] and its benefit of reducing the risk of disease. The [place] does not focus on  calories. Instead, it emphasizes high-quality, nutrient-rich foods. By knowing the characteristics of the foods, we choose, we can determine their calorie density and make informed decisions.  Targeting Your Nutrition Priorities  Clinical staff conducted group or individual video education with verbal and written material and guidebook.  Patient learns that lifestyle habits have a tremendous impact on disease risk and progression. This video provides eating and physical activity recommendations based on your personal health goals, such as reducing LDL cholesterol, losing weight, preventing or controlling type 2 diabetes, and reducing high blood pressure.  Vitamins and Minerals  Clinical staff conducted group or individual video education with verbal and written material and guidebook.  Patient learns different ways to obtain key vitamins and minerals, including through a recommended healthy diet. It is important to discuss all supplements you take with your doctor.   Healthy Mind-Set    Smoking Cessation  Clinical staff conducted group or individual video education with verbal and written material and guidebook.  Patient learns that cigarette smoking and tobacco addiction pose a serious health risk which affects millions of people. Stopping smoking will significantly reduce the risk of heart disease, lung disease, and many forms of cancer. Recommended strategies for quitting are covered, including working with your doctor to develop a successful plan.  Culinary   Becoming a Financial trader conducted group or individual video education with verbal and written material and guidebook.  Patient learns that cooking at home can be healthy, cost-effective, quick, and puts them in control. Keys to cooking healthy recipes will include looking at your recipe, assessing your equipment needs, planning ahead, making it simple, choosing cost-effective seasonal ingredients, and limiting the use of  added fats, salts, and sugars.  Cooking - Breakfast and Snacks  Clinical staff conducted group or individual video education with verbal and written material and guidebook.  Patient learns how important breakfast is to satiety and nutrition through the entire day. Recommendations include key foods to eat during breakfast to help stabilize blood sugar levels and to prevent overeating at meals later in the day. Planning ahead is also a key component.  Cooking - Human resources officer conducted group or individual video education with verbal and written material and guidebook.  Patient learns eating strategies to improve overall health, including an approach to cook more at home. Recommendations include thinking of animal protein as a side on your plate rather than center stage and focusing instead on lower calorie dense options like vegetables, fruits, whole grains, and plant-based proteins, such as beans. Making sauces in large quantities to freeze for later and leaving the skin on your vegetables are also recommended to maximize your experience.  Cooking - Healthy Salads and Dressing Clinical staff conducted group or individual video education with verbal and written material and guidebook.  Patient learns that vegetables, fruits, whole grains, and legumes are the foundations of the [place]. Recommendations include how to incorporate each of these in flavorful and healthy salads, and how to create homemade salad dressings. Proper handling of ingredients is also covered. Cooking - Soups and [employer] - Soups and Desserts Clinical staff conducted group or individual video education with verbal and written material and guidebook.  Patient learns that Pritikin soups and desserts make for easy, nutritious, and delicious snacks and meal components that are low in sodium, fat, sugar, and calorie density, while high in vitamins, minerals, and filling fiber. Recommendations  include simple and healthy ideas for soups and desserts.   Overview     The Pritikin Solution Program Overview Clinical staff conducted group or individual video education with verbal and written material and guidebook.  Patient learns that the results of the [place] Program have been documented in more than 100 articles published in peer-reviewed journals, and the benefits include reducing risk factors for (and, in some cases, even reversing) high cholesterol, high blood pressure, type 2 diabetes, obesity, and more! An overview of the three key pillars of the Pritikin Program will be covered: eating well, doing regular exercise, and  having a healthy mind-set.  WORKSHOPS  Exercise: Exercise Basics: Building Your Action Plan Clinical staff led group instruction and group discussion with PowerPoint presentation and patient guidebook. To enhance the learning environment the use of posters, models and videos may be added. At the conclusion of this workshop, patients will comprehend the difference between physical activity and exercise, as well as the benefits of incorporating both, into their routine. Patients will understand the FITT (Frequency, Intensity, Time, and Type) principle and how to use it to build an exercise action plan. In addition, safety concerns and other considerations for exercise and cardiac rehab will be addressed by the presenter. The purpose of this lesson is to promote a comprehensive and effective weekly exercise routine in order to improve patients' overall level of fitness.   Managing Heart Disease: Your Path to a Healthier Heart Clinical staff led group instruction and group discussion with PowerPoint presentation and patient guidebook. To enhance the learning environment the use of posters, models and videos may be added.At the conclusion of this workshop, patients will understand the anatomy and physiology of the heart. Additionally, they will understand how  Pritikin's three pillars impact the risk factors, the progression, and the management of heart disease.  The purpose of this lesson is to provide a high-level overview of the heart, heart disease, and how the Pritikin lifestyle positively impacts risk factors.  Exercise Biomechanics Clinical staff led group instruction and group discussion with PowerPoint presentation and patient guidebook. To enhance the learning environment the use of posters, models and videos may be added. Patients will learn how the structural parts of their bodies function and how these functions impact their daily activities, movement, and exercise. Patients will learn how to promote a neutral spine, learn how to manage pain, and identify ways to improve their physical movement in order to promote healthy living. The purpose of this lesson is to expose patients to common physical limitations that impact physical activity. Participants will learn practical ways to adapt and manage aches and pains, and to minimize their effect on regular exercise. Patients will learn how to maintain good posture while sitting, walking, and lifting.  Balance Training and Fall Prevention  Clinical staff led group instruction and group discussion with PowerPoint presentation and patient guidebook. To enhance the learning environment the use of posters, models and videos may be added. At the conclusion of this workshop, patients will understand the importance of their sensorimotor skills (vision, proprioception, and the vestibular system) in maintaining their ability to balance as they age. Patients will apply a variety of balancing exercises that are appropriate for their current level of function. Patients will understand the common causes for poor balance, possible solutions to these problems, and ways to modify their physical environment in order to minimize their fall risk. The purpose of this lesson is to teach patients about the  importance of maintaining balance as they age and ways to minimize their risk of falling.  WORKSHOPS   Nutrition:  Fueling a Scientist, research (physical sciences) led group instruction and group discussion with PowerPoint presentation and patient guidebook. To enhance the learning environment the use of posters, models and videos may be added. Patients will review the foundational principles of the [place] and understand what constitutes a serving size in each of the food groups. Patients will also learn Pritikin-friendly foods that are better choices when away from home and review make-ahead meal and snack options. Calorie density will be reviewed and applied to three nutrition  priorities: weight maintenance, weight loss, and weight gain. The purpose of this lesson is to reinforce (in a group setting) the key concepts around what patients are recommended to eat and how to apply these guidelines when away from home by planning and selecting Pritikin-friendly options. Patients will understand how calorie density may be adjusted for different weight management goals.  Mindful Eating  Clinical staff led group instruction and group discussion with PowerPoint presentation and patient guidebook. To enhance the learning environment the use of posters, models and videos may be added. Patients will briefly review the concepts of the [place] and the importance of low-calorie dense foods. The concept of mindful eating will be introduced as well as the importance of paying attention to internal hunger signals. Triggers for non-hunger eating and techniques for dealing with triggers will be explored. The purpose of this lesson is to provide patients with the opportunity to review the basic principles of the [place], discuss the value of eating mindfully and how to measure internal cues of hunger and fullness using the Hunger Scale. Patients will also discuss reasons for non-hunger eating and  learn strategies to use for controlling emotional eating.  Targeting Your Nutrition Priorities Clinical staff led group instruction and group discussion with PowerPoint presentation and patient guidebook. To enhance the learning environment the use of posters, models and videos may be added. Patients will learn how to determine their genetic susceptibility to disease by reviewing their family history. Patients will gain insight into the importance of diet as part of an overall healthy lifestyle in mitigating the impact of genetics and other environmental insults. The purpose of this lesson is to provide patients with the opportunity to assess their personal nutrition priorities by looking at their family history, their own health history and current risk factors. Patients will also be able to discuss ways of prioritizing and modifying the [place] for their highest risk areas  Menu  Clinical staff led group instruction and group discussion with PowerPoint presentation and patient guidebook. To enhance the learning environment the use of posters, models and videos may be added. Using menus brought in from [employer], or printed from [employer], patients will apply the [place] dining out guidelines that were presented in the [employer] video. Patients will also be able to practice these guidelines in a variety of provided scenarios. The purpose of this lesson is to provide patients with the opportunity to practice hands-on learning of the [place] with actual menus and practice scenarios.  Label Reading Clinical staff led group instruction and group discussion with PowerPoint presentation and patient guidebook. To enhance the learning environment the use of posters, models and videos may be added. Patients will review and discuss the Pritikin label reading guidelines presented in Pritikin's Label Reading Educational series video. Using fool  labels brought in from local grocery stores and markets, patients will apply the label reading guidelines and determine if the packaged food meet the Pritikin guidelines. The purpose of this lesson is to provide patients with the opportunity to review, discuss, and practice hands-on learning of the Pritikin Label Reading guidelines with actual packaged food labels. [place] Workshops are designed to teach patients ways to prepare quick, simple, and affordable recipes at home. The importance of nutrition's role in chronic disease risk reduction is reflected in its emphasis in the overall Pritikin program. By learning how to prepare essential core Pritikin Eating Plan recipes,  patients will increase control over what they eat; be able to customize the flavor of foods without the use of added salt, sugar, or fat; and improve the quality of the food they consume. By learning a set of core recipes which are easily assembled, quickly prepared, and affordable, patients are more likely to prepare more healthy foods at home. These workshops focus on convenient breakfasts, simple entres, side dishes, and desserts which can be prepared with minimal effort and are consistent with nutrition recommendations for cardiovascular risk reduction. Cooking [employer] are taught by a Engineer, materials (RD) who has been trained by the [employer]. The chef or RD has a clear understanding of the importance of minimizing - if not completely eliminating - added fat, sugar, and sodium in recipes. Throughout the series of [place] Workshop sessions, patients will learn about healthy ingredients and efficient methods of cooking to build confidence in their capability to prepare    Cooking School weekly topics:  Adding Flavor- Sodium-Free  Fast and Healthy Breakfasts  Powerhouse Plant-Based Proteins  Satisfying Salads and Dressings  Simple Sides and  Sauces  International Cuisine-Spotlight on the [place] Zones  Delicious Desserts  Savory Soups  Efficiency Cooking - Meals in a Snap  Tasty Appetizers and Snacks  Comforting Weekend Breakfasts  One-Pot Wonders   Fast Evening Meals  Easy [place] (Psychosocial): New Thoughts, New Behaviors Clinical staff led group instruction and group discussion with PowerPoint presentation and patient guidebook. To enhance the learning environment the use of posters, models and videos may be added. Patients will learn and practice techniques for developing effective health and lifestyle goals. Patients will be able to effectively apply the goal setting process learned to develop at least one new personal goal.  The purpose of this lesson is to expose patients to a new skill set of behavior modification techniques such as techniques setting SMART goals, overcoming barriers, and achieving new thoughts and new behaviors.  Managing Moods and Relationships Clinical staff led group instruction and group discussion with PowerPoint presentation and patient guidebook. To enhance the learning environment the use of posters, models and videos may be added. Patients will learn how emotional and chronic stress factors can impact their health and relationships. They will learn healthy ways to manage their moods and utilize positive coping mechanisms. In addition, ICR patients will learn ways to improve communication skills. The purpose of this lesson is to expose patients to ways of understanding how one's mood and health are intimately connected. Developing a healthy outlook can help build positive relationships and connections with others. Patients will understand the importance of utilizing effective communication skills that include actively listening and being heard. They will learn and understand the importance of the "4 Cs" and especially Connections in  fostering of a Healthy Mind-Set.  Healthy Sleep for a Healthy Heart Clinical staff led group instruction and group discussion with PowerPoint presentation and patient guidebook. To enhance the learning environment the use of posters, models and videos may be added. At the conclusion of this workshop, patients will be able to demonstrate knowledge of the importance of sleep to overall health, well-being, and quality of life. They will understand the symptoms of, and treatments for, common sleep disorders. Patients will also be able to identify daytime and nighttime behaviors which impact sleep, and they will be able to apply these tools to help manage sleep-related challenges. The purpose of this  lesson is to provide patients with a general overview of sleep and outline the importance of quality sleep. Patients will learn about a few of the most common sleep disorders. Patients will also be introduced to the concept of "sleep hygiene," and discover ways to self-manage certain sleeping problems through simple daily behavior changes. Finally, the workshop will motivate patients by clarifying the links between quality sleep and their goals of heart-healthy living.   Recognizing and Reducing Stress Clinical staff led group instruction and group discussion with PowerPoint presentation and patient guidebook. To enhance the learning environment the use of posters, models and videos may be added. At the conclusion of this workshop, patients will be able to understand the types of stress reactions, differentiate between acute and chronic stress, and recognize the impact that chronic stress has on their health. They will also be able to apply different coping mechanisms, such as reframing negative self-talk. Patients will have the opportunity to practice a variety of stress management techniques, such as deep abdominal breathing, progressive muscle relaxation, and/or guided imagery.  The purpose of this lesson is to  educate patients on the role of stress in their lives and to provide healthy techniques for coping with it.  Learning Barriers/Preferences:  Learning Barriers/Preferences - 11/21/21 1349       Learning Barriers/Preferences   Learning Barriers Sight   wears glasses   Learning Preferences Group Instruction;Individual Instruction;Skilled Demonstration             Education Topics:  Knowledge Questionnaire Score:  Knowledge Questionnaire Score - 11/21/21 1344       Knowledge Questionnaire Score   Pre Score 21/24             Core Components/Risk Factors/Patient Goals at Admission:  Personal Goals and Risk Factors at Admission - 11/21/21 1348       Core Components/Risk Factors/Patient Goals on Admission    Weight Management Yes;Obesity;Weight Loss    Intervention Weight Management: Develop a combined nutrition and exercise program designed to reach desired caloric intake, while maintaining appropriate intake of nutrient and fiber, sodium and fats, and appropriate energy expenditure required for the weight goal.;Weight Management: Provide education and appropriate resources to help participant work on and attain dietary goals.;Weight Management/Obesity: Establish reasonable short term and long term weight goals.;Obesity: Provide education and appropriate resources to help participant work on and attain dietary goals.    Admit Weight 201 lb 15.1 oz (91.6 kg)    Expected Outcomes Short Term: Continue to assess and modify interventions until short term weight is achieved;Long Term: Adherence to nutrition and physical activity/exercise program aimed toward attainment of established weight goal;Weight Loss: Understanding of general recommendations for a balanced deficit meal plan, which promotes 1-2 lb weight loss per week and includes a negative energy balance of [phone] kcal/d;Understanding recommendations for meals to include 15-35% energy as protein, 25-35% energy from fat, 35-60%  energy from carbohydrates, less than 200mg  of dietary cholesterol, 20-35 gm of total fiber daily;Understanding of distribution of calorie intake throughout the day with the consumption of 4-5 meals/snacks    Diabetes Yes    Intervention Provide education about signs/symptoms and action to take for hypo/hyperglycemia.;Provide education about proper nutrition, including hydration, and aerobic/resistive exercise prescription along with prescribed medications to achieve blood glucose in normal ranges: Fasting glucose 65-99 mg/dL    Expected Outcomes Long Term: Attainment of HbA1C < 7%.;Short Term: Participant verbalizes understanding of the signs/symptoms and immediate care of hyper/hypoglycemia, proper foot care and importance of medication,  aerobic/resistive exercise and nutrition plan for blood glucose control.    Hypertension Yes    Intervention Provide education on lifestyle modifcations including regular physical activity/exercise, weight management, moderate sodium restriction and increased consumption of fresh fruit, vegetables, and low fat dairy, alcohol moderation, and smoking cessation.;Monitor prescription use compliance.    Expected Outcomes Short Term: Continued assessment and intervention until BP is < 140/76mm HG in hypertensive participants. < 130/78mm HG in hypertensive participants with diabetes, heart failure or chronic kidney disease.;Long Term: Maintenance of blood pressure at goal levels.    Lipids Yes    Intervention Provide education and support for participant on nutrition & aerobic/resistive exercise along with prescribed medications to achieve LDL 70mg , HDL >40mg .    Expected Outcomes Short Term: Participant states understanding of desired cholesterol values and is compliant with medications prescribed. Participant is following exercise prescription and nutrition guidelines.;Long Term: Cholesterol controlled with medications as prescribed, with individualized exercise RX and with  personalized nutrition plan. Value goals: LDL < 70mg , HDL > 40 mg.             Core Components/Risk Factors/Patient Goals Review:    Core Components/Risk Factors/Patient Goals at Discharge (Final Review):    ITP Comments:  ITP Comments     Row Name 11/21/21 0830           ITP Comments Medical Director- Dr. Fransico Him, MD                Comments: Participant attended orientation for the cardiac rehabilitation program on  11/21/2021  to perform initial intake and exercise walk test. Patient introduced to the [facility] education and orientation packet was reviewed. Completed 6-minute walk test, measurements, initial ITP, and exercise prescription. Vital signs stable. Telemetry-normal sinus rhythm, asymptomatic. 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 Morgan [name] will start cardiac rehab on October 9th as she will be out of town next week to help her daughter move. Patient was instructed to get her CGM reapplied before coming to cardiac rehab as she has not been checking her CBG's at home and does not have a CBG meter or strips to check. Patient states understanding as CBG was 288 today. Reviewed diabetic guidelines with the patient today.Harrell Gave RN BSN   Service time was from [phone] to [phone].

## 2021-11-21 NOTE — Progress Notes (Signed)
Cardiac Rehab Medication Review by a Nurse  Does the patient  feel that his/her medications are working for him/her?  YES   Has the patient been experiencing any side effects to the medications prescribed?  NO  Does the patient measure his/her own blood pressure or blood glucose at home?  NO   Does the patient have any problems obtaining medications due to transportation or finances?   NO  Understanding of regimen: fair Understanding of indications: fair Potential of compliance: fair    Nurse comments: Morgan [name] says she is not taking her ozempic having issues with insurance coverage. Morgan [name] has not checked her CBG in tow weeks as she says there is a problem with her Dexcom G6. Will hold cardiac rehab until patient gets her CGM reapplied.Morgan [name] does not check her blood pressures at home. Morgan [name] is scheduled to start cardiac rehab on 12/04/21.    Christa See Arun Herrod RN 11/21/2021 4:59 PM

## 2021-11-27 ENCOUNTER — Ambulatory Visit (HOSPITAL_COMMUNITY): Payer: BC Managed Care – PPO

## 2021-11-29 ENCOUNTER — Ambulatory Visit (HOSPITAL_COMMUNITY): Payer: BC Managed Care – PPO

## 2021-11-29 ENCOUNTER — Encounter: Payer: Self-pay | Admitting: Occupational Therapy

## 2021-11-29 NOTE — Therapy (Signed)
[facility] [address], [place], [place], [address] Phone: [phone]   Fax:  [phone]  Patient Details  Name: Morgan [name] MRN: [id number] Date of Birth: 25-Mar-1970 Referring Provider:  No ref. provider found  Encounter Date: 11/29/2021  OCCUPATIONAL THERAPY DISCHARGE SUMMARY  Visits from Start of Care: 6  Current functional level related to goals / functional outcomes: Unable to assess as pt did not return since last visit, 10/16/21.  At last visit pt demonstrating improvements in coordination and grip with tying shoes and other clothing fasteners, increased shoulder ROM and strength.   Remaining deficits: difficulty with opening/closing containers and placing items in cabinets with RUE.     Education / Equipment: HEP with focus on coordination, strengthening, and ROM   Patient agrees to discharge. Patient goals were partially met. Patient is being discharged due to not returning since the last visit.Marland Kitchen     Simonne Come, OT 11/29/2021, 4:34 PM  [facility] [address] [place], [place], [address] Phone: [phone]   Fax:  [phone]

## 2021-11-30 ENCOUNTER — Telehealth (HOSPITAL_COMMUNITY): Payer: Self-pay | Admitting: *Deleted

## 2021-11-30 NOTE — Telephone Encounter (Signed)
Left message to call cardiac rehab.Barnet Pall, RN,BSN 11/30/2021 3:45 PM

## 2021-12-01 ENCOUNTER — Telehealth (HOSPITAL_COMMUNITY): Payer: Self-pay | Admitting: *Deleted

## 2021-12-01 ENCOUNTER — Ambulatory Visit (HOSPITAL_COMMUNITY): Payer: BC Managed Care – PPO

## 2021-12-01 NOTE — Telephone Encounter (Signed)
Spoke to [place] she hopes to received her meter this weekend and plans to started exercise on Monday morning.Harrell Gave RN BSN

## 2021-12-04 ENCOUNTER — Ambulatory Visit (HOSPITAL_COMMUNITY): Payer: BC Managed Care – PPO

## 2021-12-04 ENCOUNTER — Telehealth (HOSPITAL_COMMUNITY): Payer: Self-pay

## 2021-12-04 NOTE — Telephone Encounter (Signed)
Received call from [name] that she would be out today due to transportation. Pt will be coming Wednesday. Will notify staff.

## 2021-12-06 ENCOUNTER — Ambulatory Visit (HOSPITAL_COMMUNITY): Payer: BC Managed Care – PPO

## 2021-12-06 NOTE — Progress Notes (Signed)
Cardiac Individual Treatment Plan  Patient Details  Name: Morgan [name] MRN: [id number] Date of Birth: [birth year] Referring Provider:   Flowsheet Row INTENSIVE CARDIAC REHAB ORIENT from 11/21/2021 in [place]  Referring Provider Dr Iona Beard Adam's MD/ Dr Fransico Him, MD, covering       Initial Encounter Date:  [place] from 11/21/2021 in [place]  Date 11/21/21       Visit Diagnosis: 08/24/21 DES LAD & CX, PTCA diagonal at [facility]  Patient's Home Medications on Admission:  Current Outpatient Medications:    amLODipine (NORVASC) 5 MG tablet, Take 5 mg by mouth at bedtime., Disp: , Rfl: 11   aspirin 81 MG chewable tablet, Chew 1 tablet (81 mg total) by mouth daily., Disp: 30 tablet, Rfl: 1   B Complex-C-Folic Acid (RENA-VITE PO), Take 1 tablet by mouth daily., Disp: , Rfl:    Insulin Aspart FlexPen (NOVOLOG) 100 UNIT/ML, Inject 16 units with each meal. TDD 48 units., Disp: , Rfl:    Insulin Glargine (BASAGLAR KWIKPEN) 100 UNIT/ML, Inject 20 Units into the skin 2 (two) times daily., Disp: 30 mL, Rfl: 1   levonorgestrel (MIRENA) 20 MCG/24HR IUD, 1 each by Intrauterine route once., Disp: , Rfl:    MM PEN NEEDLES 31G X 5 MM MISC, USE  IN THE MORNING AND AT BEDTIME, Disp: 100 each, Rfl: 0   mycophenolate (CELLCEPT) 250 MG capsule, Take 750 mg by mouth daily. Take 2 capsules (500 mg total) by mouth once daily AND 1 capsule (250 mg total) at bedtime., Disp: , Rfl:    omeprazole (PRILOSEC OTC) 20 MG tablet, Take by mouth., Disp: , Rfl:    predniSONE (DELTASONE) 5 MG tablet, Take 5 mg by mouth daily with breakfast., Disp: , Rfl:    rosuvastatin (CRESTOR) 20 MG tablet, Take 1 tablet (20 mg total) by mouth daily., Disp: 90 tablet, Rfl: 0   sodium bicarbonate 650 MG tablet, Take 1,300 mg by mouth 2 (two) times daily., Disp: , Rfl:    tacrolimus (PROGRAF) 1 MG capsule, Take 4 mg by mouth 2  (two) times daily., Disp: , Rfl:    ticagrelor (BRILINTA) 90 MG TABS tablet, Take 1 tablet (90 mg total) by mouth 2 (two) times daily., Disp: 60 tablet, Rfl: 2   Continuous Blood Gluc Receiver (Frank) DEVI, [place] (Patient not taking: Reported on 11/21/2021), Disp: , Rfl:    Continuous Blood Gluc Sensor (DEXCOM G6 SENSOR) MISC, CHANGE SENSOR EVERY 10 DAYS (Patient not taking: Reported on 11/21/2021), Disp: 3 each, Rfl: 3   Continuous Blood Gluc Transmit (DEXCOM G6 TRANSMITTER) MISC, USE TO CHECK BLOOD SUGAR AS DIRECTED (Patient not taking: Reported on 11/21/2021), Disp: 1 each, Rfl: 0   folic acid (FOLVITE) 1 MG tablet, Take 1 mg by mouth daily., Disp: , Rfl:    glucose blood (ONETOUCH VERIO) test strip, 1 each by Other route 2 (two) times daily. And lancets 2/day (Patient not taking: Reported on 11/21/2021), Disp: 100 each, Rfl: 12   Lancets (ONETOUCH DELICA PLUS [id number]) MISC, USE TWICE A DAY TO CHECK BLOOD SUGAR (Patient not taking: Reported on 11/21/2021), Disp: , Rfl:    OZEMPIC, 1 MG/DOSE, 4 MG/3ML SOPN, Inject 1 mg into the skin once a week. (Patient not taking: Reported on 11/21/2021), Disp: , Rfl:   Past Medical History: Past Medical History:  Diagnosis Date   Anemia    low iron  Constipation    when given iron   Coronary artery disease    Diabetes mellitus without complication (HCC)    type 2   Diabetic retinopathy ([place])    H/O detached retina repair    bilateral   History of CVA (cerebrovascular accident)    Hyperlipidemia    Hypertension    Kidney transplanted    Pneumonia    PVD (peripheral vascular disease) ([place])    S/P drug eluting coronary stent placement 08/24/2021   AT [facility] Dr Iona Beard Adam's    Tobacco Use: Social History   Tobacco Use  Smoking Status Never  Smokeless Tobacco Never    Labs: Review Flowsheet  More data exists      Latest Ref Rng & Units 03/04/2019 07/10/2019 05/13/2021 09/08/2021 09/09/2021  Labs for ITP Cardiac and Pulmonary  Rehab  Cholestrol 0 - 200 mg/dL - - 124  - 119   LDL (calc) 0 - 99 mg/dL - - 76  - 48   HDL-C >40 mg/dL - - 37  - 34   Trlycerides <150 mg/dL - - 54  - 183   Hemoglobin A1c 4.8 - 5.6 % 12.0  7.8  10.2  - 12.4   TCO2 22 - 32 mmol/L - - - 18  -    Capillary Blood Glucose: Lab Results  Component Value Date   GLUCAP 288 (H) 11/21/2021   GLUCAP 148 (H) 09/11/2021   GLUCAP 273 (H) 09/10/2021   GLUCAP 317 (H) 09/10/2021   GLUCAP 284 (H) 09/10/2021     Exercise Target Goals: Exercise Program Goal: Individual exercise prescription set using results from initial 6 min walk test and THRR while considering  patient's activity barriers and safety.   Exercise Prescription Goal: Initial exercise prescription builds to 30-45 minutes a day of aerobic activity, 2-3 days per week.  Home exercise guidelines will be given to patient during program as part of exercise prescription that the participant will acknowledge.  Activity Barriers & Risk Stratification:  Activity Barriers & Cardiac Risk Stratification - 11/21/21 1354       Activity Barriers & Cardiac Risk Stratification   Activity Barriers Deconditioning;Muscular Weakness;Balance Concerns    Cardiac Risk Stratification High             6 Minute Walk:  6 Minute Walk     Row Name 11/21/21 1351         6 Minute Walk   Phase Initial  Done on Nustep     Distance 951 feet     Walk Time 6 minutes     # of Rest Breaks 0     MPH 1.8     METS 2.79     RPE 6     Perceived Dyspnea  0     VO2 Peak 9.8     Symptoms No     Resting HR 94 bpm     Resting BP 148/84  taken on calf     Resting Oxygen Saturation  100 %     Exercise Oxygen Saturation  during 6 min walk 98 %     Max Ex. HR 94 bpm     Max Ex. BP 151/58  Taken on thigh     2 Minute Post BP 156/64  Taken on thigh              Oxygen Initial Assessment:   Oxygen Re-Evaluation:   Oxygen Discharge (Final Oxygen Re-Evaluation):   Initial Exercise Prescription:   Initial Exercise  Prescription - 11/21/21 1300       Date of Initial Exercise RX and Referring Provider   Date 11/21/21    Referring Provider Dr Avie Echevaria MD/ Dr Fransico Him, MD, covering    Expected Discharge Date 01/26/22      NuStep   Level 1    SPM 70    Minutes 30    METs 2.7      Prescription Details   Frequency (times per week) 3    Duration Progress to 30 minutes of continuous aerobic without signs/symptoms of physical distress      Intensity   THRR 40-80% of Max Heartrate 67-134    Ratings of Perceived Exertion 11-13    Perceived Dyspnea 0-4      Progression   Progression Continue progressive overload as per policy without signs/symptoms or physical distress.      Resistance Training   Training Prescription Yes    Weight 2 lbs    Reps 10-15             Perform Capillary Blood Glucose checks as needed.  Exercise Prescription Changes:   Exercise Comments:   Exercise Goals and Review:   Exercise Goals     Row Name 11/21/21 1355             Exercise Goals   Increase Physical Activity Yes       Intervention Provide advice, education, support and counseling about physical activity/exercise needs.;Develop an individualized exercise prescription for aerobic and resistive training based on initial evaluation findings, risk stratification, comorbidities and participant's personal goals.       Expected Outcomes Short Term: Attend rehab on a regular basis to increase amount of physical activity.;Long Term: Add in home exercise to make exercise part of routine and to increase amount of physical activity.;Long Term: Exercising regularly at least 3-5 days a week.       Increase Strength and Stamina Yes       Intervention Provide advice, education, support and counseling about physical activity/exercise needs.;Develop an individualized exercise prescription for aerobic and resistive training based on initial evaluation findings, risk stratification,  comorbidities and participant's personal goals.       Expected Outcomes Short Term: Increase workloads from initial exercise prescription for resistance, speed, and METs.;Short Term: Perform resistance training exercises routinely during rehab and add in resistance training at home;Long Term: Improve cardiorespiratory fitness, muscular endurance and strength as measured by increased METs and functional capacity (6MWT)       Able to understand and use rate of perceived exertion (RPE) scale Yes       Intervention Provide education and explanation on how to use RPE scale       Expected Outcomes Short Term: Able to use RPE daily in rehab to express subjective intensity level;Long Term:  Able to use RPE to guide intensity level when exercising independently       Knowledge and understanding of Target Heart Rate Range (THRR) Yes       Intervention Provide education and explanation of THRR including how the numbers were predicted and where they are located for reference       Expected Outcomes Short Term: Able to state/look up THRR;Short Term: Able to use daily as guideline for intensity in rehab;Long Term: Able to use THRR to govern intensity when exercising independently       Understanding of Exercise Prescription Yes       Intervention Provide education, explanation, and written materials on patient's individual  exercise prescription       Expected Outcomes Short Term: Able to explain program exercise prescription;Long Term: Able to explain home exercise prescription to exercise independently                Exercise Goals Re-Evaluation :   Discharge Exercise Prescription (Final Exercise Prescription Changes):   Nutrition:  Target Goals: Understanding of nutrition guidelines, daily intake of sodium 1500mg , cholesterol 200mg , calories 30% from fat and 7% or less from saturated fats, daily to have 5 or more servings of fruits and vegetables.  Biometrics:  Pre Biometrics - 11/21/21 0835        Pre Biometrics   Waist Circumference 49 inches    Hip Circumference 51.5 inches    Waist to Hip Ratio 0.95 %    Triceps Skinfold 32 mm    % Body Fat 49 %    Grip Strength 22 kg    Flexibility 0 in    Single Leg Stand 1.52 seconds              Nutrition Therapy Plan and Nutrition Goals:   Nutrition Assessments:  MEDIFICTS Score Key: ?70 Need to make dietary changes  40-70 Heart Healthy Diet ? 40 Therapeutic Level Cholesterol Diet    Picture Your Plate Scores: <16 Unhealthy dietary pattern with much room for improvement. 41-50 Dietary pattern unlikely to meet recommendations for good health and room for improvement. 51-60 More healthful dietary pattern, with some room for improvement.  >60 Healthy dietary pattern, although there may be some specific behaviors that could be improved.    Nutrition Goals Re-Evaluation:   Nutrition Goals Re-Evaluation:   Nutrition Goals Discharge (Final Nutrition Goals Re-Evaluation):   Psychosocial: Target Goals: Acknowledge presence or absence of significant depression and/or stress, maximize coping skills, provide positive support system. Participant is able to verbalize types and ability to use techniques and skills needed for reducing stress and depression.  Initial Review & Psychosocial Screening:  Initial Psych Review & Screening - 11/21/21 1636       Initial Review   Current issues with Current Stress Concerns    Source of Stress Concerns Chronic Illness    Comments Safira has health related stress as she is reovering from a recent [place]? Yes   Laylamarie has her husband and daughter for support     Barriers   Psychosocial barriers to participate in program The patient should benefit from training in stress management and relaxation.      Screening Interventions   Interventions Encouraged to exercise    Expected Outcomes Long Term Goal: Stressors or current issues are controlled  or eliminated.             Quality of Life Scores:  Quality of Life - 11/21/21 1350       Quality of Life   Select Quality of Life      Quality of Life Scores   Health/Function Pre 22.53 %    Socioeconomic Pre 24 %    Psych/Spiritual Pre 24 %    Family Pre 28.8 %    GLOBAL Pre 24.06 %            Scores of 19 and below usually indicate a poorer quality of life in these areas.  A difference of  2-3 points is a clinically meaningful difference.  A difference of 2-3 points in the total score of the Quality of Life Index has  been associated with significant improvement in overall quality of life, self-image, physical symptoms, and general health in studies assessing change in quality of life.  PHQ-9: Review Flowsheet       11/21/2021 01/11/2017 10/06/2014  Depression screen PHQ 2/9  Decreased Interest 0 0 0  Down, Depressed, Hopeless 0 0 0  PHQ - 2 Score 0 0 0   Interpretation of Total Score  Total Score Depression Severity:  1-4 = Minimal depression, 5-9 = Mild depression, 10-14 = Moderate depression, 15-19 = Moderately severe depression, 20-27 = Severe depression   Psychosocial Evaluation and Intervention:   Psychosocial Re-Evaluation:   Psychosocial Discharge (Final Psychosocial Re-Evaluation):   Vocational Rehabilitation: Provide vocational rehab assistance to qualifying candidates.   Vocational Rehab Evaluation & Intervention:  Vocational Rehab - 11/21/21 1639       Initial Vocational Rehab Evaluation & Intervention   Assessment shows need for Vocational Rehabilitation No   Jabria says that she is working and does not need vocational rehab at this time            Education: Education Goals: Education classes will be provided on a weekly basis, covering required topics. Participant will state understanding/return demonstration of topics presented.     Core Videos: Exercise    Move It!  Clinical staff conducted group or individual video  education with verbal and written material and guidebook.  Patient learns the recommended Pritikin exercise program. Exercise with the goal of living a long, healthy life. Some of the health benefits of exercise include controlled diabetes, healthier blood pressure levels, improved cholesterol levels, improved heart and lung capacity, improved sleep, and better body composition. Everyone should speak with their doctor before starting or changing an exercise routine.  Biomechanical Limitations Clinical staff conducted group or individual video education with verbal and written material and guidebook.  Patient learns how biomechanical limitations can impact exercise and how we can mitigate and possibly overcome limitations to have an impactful and balanced exercise routine.  Body Composition Clinical staff conducted group or individual video education with verbal and written material and guidebook.  Patient learns that body composition (ratio of muscle mass to fat mass) is a key component to assessing overall fitness, rather than body weight alone. Increased fat mass, especially visceral belly fat, can put [place] at increased risk for metabolic syndrome, type 2 diabetes, heart disease, and even death. It is recommended to combine diet and exercise (cardiovascular and resistance training) to improve your body composition. Seek guidance from your physician and exercise physiologist before implementing an exercise routine.  Exercise Action Plan Clinical staff conducted group or individual video education with verbal and written material and guidebook.  Patient learns the recommended strategies to achieve and enjoy long-term exercise adherence, including variety, self-motivation, self-efficacy, and positive decision making. Benefits of exercise include fitness, good health, weight management, more energy, better sleep, less stress, and overall well-being.  Medical   Heart Disease Risk Reduction Clinical staff  conducted group or individual video education with verbal and written material and guidebook.  Patient learns our heart is our most vital organ as it circulates oxygen, nutrients, white blood cells, and hormones throughout the entire body, and carries waste away. Data supports a plant-based eating plan like the Pritikin Program for its effectiveness in slowing progression of and reversing heart disease. The video provides a number of recommendations to address heart disease.   Metabolic Syndrome and Belly Fat  Clinical staff conducted group or individual video education with verbal and  written material and guidebook.  Patient learns what metabolic syndrome is, how it leads to heart disease, and how one can reverse it and keep it from coming back. You have metabolic syndrome if you have 3 of the following 5 criteria: abdominal obesity, high blood pressure, high triglycerides, low HDL cholesterol, and high blood sugar.  Hypertension and Heart Disease Clinical staff conducted group or individual video education with verbal and written material and guidebook.  Patient learns that high blood pressure, or hypertension, is very common in the [place]. Hypertension is largely due to excessive salt intake, but other important risk factors include being overweight, physical inactivity, drinking too much alcohol, smoking, and not eating enough potassium from fruits and vegetables. High blood pressure is a leading risk factor for heart attack, stroke, congestive heart failure, dementia, kidney failure, and premature death. Long-term effects of excessive salt intake include stiffening of the arteries and thickening of heart muscle and organ damage. Recommendations include ways to reduce hypertension and the risk of heart disease.  Diseases of Our Time - Focusing on Diabetes Clinical staff conducted group or individual video education with verbal and written material and guidebook.  Patient learns why the best  way to stop diseases of our time is prevention, through food and other lifestyle changes. Medicine (such as prescription pills and surgeries) is often only a Band-Aid on the problem, not a long-term solution. Most common diseases of our time include obesity, type 2 diabetes, hypertension, heart disease, and cancer. The Pritikin Program is recommended and has been proven to help reduce, reverse, and/or prevent the damaging effects of metabolic syndrome.  Nutrition   Overview of the Pritikin Eating Plan  Clinical staff conducted group or individual video education with verbal and written material and guidebook.  Patient learns about the [place] for disease risk reduction. The [place] emphasizes a wide variety of unrefined, minimally-processed carbohydrates, like fruits, vegetables, whole grains, and legumes. Go, Caution, and Stop food choices are explained. Plant-based and lean animal proteins are emphasized. Rationale provided for low sodium intake for blood pressure control, low added sugars for blood sugar stabilization, and low added fats and oils for coronary artery disease risk reduction and weight management.  Calorie Density  Clinical staff conducted group or individual video education with verbal and written material and guidebook.  Patient learns about calorie density and how it impacts the Pritikin Eating Plan. Knowing the characteristics of the food you choose will help you decide whether those foods will lead to weight gain or weight loss, and whether you want to consume more or less of them. Weight loss is usually a side effect of the Pritikin Eating Plan because of its focus on low calorie-dense foods.  Label Reading  Clinical staff conducted group or individual video education with verbal and written material and guidebook.  Patient learns about the Pritikin recommended label reading guidelines and corresponding recommendations regarding calorie density, added  sugars, sodium content, and whole grains.  Dining Out - Part 1  Clinical staff conducted group or individual video education with verbal and written material and guidebook.  Patient learns that restaurant meals can be sabotaging because they can be so high in calories, fat, sodium, and/or sugar. Patient learns recommended strategies on how to positively address this and avoid unhealthy pitfalls.  Facts on Fats  Clinical staff conducted group or individual video education with verbal and written material and guidebook.  Patient learns that lifestyle modifications can be just as effective,  if not more so, as many medications for lowering your risk of heart disease. A Pritikin lifestyle can help to reduce your risk of inflammation and atherosclerosis (cholesterol build-up, or plaque, in the artery walls). Lifestyle interventions such as dietary choices and physical activity address the cause of atherosclerosis. A review of the types of fats and their impact on blood cholesterol levels, along with dietary recommendations to reduce fat intake is also included.  Nutrition Action Plan  Clinical staff conducted group or individual video education with verbal and written material and guidebook.  Patient learns how to incorporate Pritikin recommendations into their lifestyle. Recommendations include planning and keeping personal health goals in mind as an important part of their success.  Healthy Mind-Set    Healthy Minds, Bodies, Hearts  Clinical staff conducted group or individual video education with verbal and written material and guidebook.  Patient learns how to identify when they are stressed. Video will discuss the impact of that stress, as well as the many benefits of stress management. Patient will also be introduced to stress management techniques. The way we think, act, and feel has an impact on our hearts.  How Our Thoughts Can Heal Our Hearts  Clinical staff conducted group or individual  video education with verbal and written material and guidebook.  Patient learns that negative thoughts can cause depression and anxiety. This can result in negative lifestyle behavior and serious health problems. Cognitive behavioral therapy is an effective method to help control our thoughts in order to change and improve our emotional outlook.  Additional Videos:  Exercise    Improving Performance  Clinical staff conducted group or individual video education with verbal and written material and guidebook.  Patient learns to use a non-linear approach by alternating intensity levels and lengths of time spent exercising to help burn more calories and lose more body fat. Cardiovascular exercise helps improve heart health, metabolism, hormonal balance, blood sugar control, and recovery from fatigue. Resistance training improves strength, endurance, balance, coordination, reaction time, metabolism, and muscle mass. Flexibility exercise improves circulation, posture, and balance. Seek guidance from your physician and exercise physiologist before implementing an exercise routine and learn your capabilities and proper form for all exercise.  Introduction to Yoga  Clinical staff conducted group or individual video education with verbal and written material and guidebook.  Patient learns about yoga, a discipline of the coming together of mind, breath, and body. The benefits of yoga include improved flexibility, improved range of motion, better posture and core strength, increased lung function, weight loss, and positive self-image. Yoga's heart health benefits include lowered blood pressure, healthier heart rate, decreased cholesterol and triglyceride levels, improved immune function, and reduced stress. Seek guidance from your physician and exercise physiologist before implementing an exercise routine and learn your capabilities and proper form for all exercise.  Medical   Aging: Enhancing Your Quality of  Life  Clinical staff conducted group or individual video education with verbal and written material and guidebook.  Patient learns key strategies and recommendations to stay in good physical health and enhance quality of life, such as prevention strategies, having an advocate, securing a [place], and keeping a list of medications and system for tracking them. It also discusses how to avoid risk for bone loss.  Biology of Weight Control  Clinical staff conducted group or individual video education with verbal and written material and guidebook.  Patient learns that weight gain occurs because we consume more calories than we  burn (eating more, moving less). Even if your body weight is normal, you may have higher ratios of fat compared to muscle mass. Too much body fat puts you at increased risk for cardiovascular disease, heart attack, stroke, type 2 diabetes, and obesity-related cancers. In addition to exercise, following the [place] can help reduce your risk.  Decoding Lab Results  Clinical staff conducted group or individual video education with verbal and written material and guidebook.  Patient learns that lab test reflects one measurement whose values change over time and are influenced by many factors, including medication, stress, sleep, exercise, food, hydration, pre-existing medical conditions, and more. It is recommended to use the knowledge from this video to become more involved with your lab results and evaluate your numbers to speak with your doctor.   Diseases of Our Time - Overview  Clinical staff conducted group or individual video education with verbal and written material and guidebook.  Patient learns that according to the CDC, 50% to 70% of chronic diseases (such as obesity, type 2 diabetes, elevated lipids, hypertension, and heart disease) are avoidable through lifestyle improvements including healthier food choices, listening to  satiety cues, and increased physical activity.  Sleep Disorders Clinical staff conducted group or individual video education with verbal and written material and guidebook.  Patient learns how good quality and duration of sleep are important to overall health and well-being. Patient also learns about sleep disorders and how they impact health along with recommendations to address them, including discussing with a physician.  Nutrition  Dining Out - Part 2 Clinical staff conducted group or individual video education with verbal and written material and guidebook.  Patient learns how to plan ahead and communicate in order to maximize their dining experience in a healthy and nutritious manner. Included are recommended food choices based on the type of restaurant the patient is visiting.   Fueling a Best boy conducted group or individual video education with verbal and written material and guidebook.  There is a strong connection between our food choices and our health. Diseases like obesity and type 2 diabetes are very prevalent and are in large-part due to lifestyle choices. The Pritikin Eating Plan provides plenty of food and hunger-curbing satisfaction. It is easy to follow, affordable, and helps reduce health risks.  Menu Workshop  Clinical staff conducted group or individual video education with verbal and written material and guidebook.  Patient learns that restaurant meals can sabotage health goals because they are often packed with calories, fat, sodium, and sugar. Recommendations include strategies to plan ahead and to communicate with the manager, chef, or server to help order a healthier meal.  Planning Your Eating Strategy  Clinical staff conducted group or individual video education with verbal and written material and guidebook.  Patient learns about the [place] and its benefit of reducing the risk of disease. The [place] does not focus on  calories. Instead, it emphasizes high-quality, nutrient-rich foods. By knowing the characteristics of the foods, we choose, we can determine their calorie density and make informed decisions.  Targeting Your Nutrition Priorities  Clinical staff conducted group or individual video education with verbal and written material and guidebook.  Patient learns that lifestyle habits have a tremendous impact on disease risk and progression. This video provides eating and physical activity recommendations based on your personal health goals, such as reducing LDL cholesterol, losing weight, preventing or controlling type 2 diabetes, and reducing high blood pressure.  Vitamins and Minerals  Clinical staff conducted group or individual video education with verbal and written material and guidebook.  Patient learns different ways to obtain key vitamins and minerals, including through a recommended healthy diet. It is important to discuss all supplements you take with your doctor.   Healthy Mind-Set    Smoking Cessation  Clinical staff conducted group or individual video education with verbal and written material and guidebook.  Patient learns that cigarette smoking and tobacco addiction pose a serious health risk which affects millions of people. Stopping smoking will significantly reduce the risk of heart disease, lung disease, and many forms of cancer. Recommended strategies for quitting are covered, including working with your doctor to develop a successful plan.  Culinary   Becoming a Financial trader conducted group or individual video education with verbal and written material and guidebook.  Patient learns that cooking at home can be healthy, cost-effective, quick, and puts them in control. Keys to cooking healthy recipes will include looking at your recipe, assessing your equipment needs, planning ahead, making it simple, choosing cost-effective seasonal ingredients, and limiting the use of  added fats, salts, and sugars.  Cooking - Breakfast and Snacks  Clinical staff conducted group or individual video education with verbal and written material and guidebook.  Patient learns how important breakfast is to satiety and nutrition through the entire day. Recommendations include key foods to eat during breakfast to help stabilize blood sugar levels and to prevent overeating at meals later in the day. Planning ahead is also a key component.  Cooking - Human resources officer conducted group or individual video education with verbal and written material and guidebook.  Patient learns eating strategies to improve overall health, including an approach to cook more at home. Recommendations include thinking of animal protein as a side on your plate rather than center stage and focusing instead on lower calorie dense options like vegetables, fruits, whole grains, and plant-based proteins, such as beans. Making sauces in large quantities to freeze for later and leaving the skin on your vegetables are also recommended to maximize your experience.  Cooking - Healthy Salads and Dressing Clinical staff conducted group or individual video education with verbal and written material and guidebook.  Patient learns that vegetables, fruits, whole grains, and legumes are the foundations of the [place]. Recommendations include how to incorporate each of these in flavorful and healthy salads, and how to create homemade salad dressings. Proper handling of ingredients is also covered. Cooking - Soups and [employer] - Soups and Desserts Clinical staff conducted group or individual video education with verbal and written material and guidebook.  Patient learns that Pritikin soups and desserts make for easy, nutritious, and delicious snacks and meal components that are low in sodium, fat, sugar, and calorie density, while high in vitamins, minerals, and filling fiber. Recommendations  include simple and healthy ideas for soups and desserts.   Overview     The Pritikin Solution Program Overview Clinical staff conducted group or individual video education with verbal and written material and guidebook.  Patient learns that the results of the [place] Program have been documented in more than 100 articles published in peer-reviewed journals, and the benefits include reducing risk factors for (and, in some cases, even reversing) high cholesterol, high blood pressure, type 2 diabetes, obesity, and more! An overview of the three key pillars of the Pritikin Program will be covered: eating well, doing regular exercise, and  having a healthy mind-set.  WORKSHOPS  Exercise: Exercise Basics: Building Your Action Plan Clinical staff led group instruction and group discussion with PowerPoint presentation and patient guidebook. To enhance the learning environment the use of posters, models and videos may be added. At the conclusion of this workshop, patients will comprehend the difference between physical activity and exercise, as well as the benefits of incorporating both, into their routine. Patients will understand the FITT (Frequency, Intensity, Time, and Type) principle and how to use it to build an exercise action plan. In addition, safety concerns and other considerations for exercise and cardiac rehab will be addressed by the presenter. The purpose of this lesson is to promote a comprehensive and effective weekly exercise routine in order to improve patients' overall level of fitness.   Managing Heart Disease: Your Path to a Healthier Heart Clinical staff led group instruction and group discussion with PowerPoint presentation and patient guidebook. To enhance the learning environment the use of posters, models and videos may be added.At the conclusion of this workshop, patients will understand the anatomy and physiology of the heart. Additionally, they will understand how  Pritikin's three pillars impact the risk factors, the progression, and the management of heart disease.  The purpose of this lesson is to provide a high-level overview of the heart, heart disease, and how the Pritikin lifestyle positively impacts risk factors.  Exercise Biomechanics Clinical staff led group instruction and group discussion with PowerPoint presentation and patient guidebook. To enhance the learning environment the use of posters, models and videos may be added. Patients will learn how the structural parts of their bodies function and how these functions impact their daily activities, movement, and exercise. Patients will learn how to promote a neutral spine, learn how to manage pain, and identify ways to improve their physical movement in order to promote healthy living. The purpose of this lesson is to expose patients to common physical limitations that impact physical activity. Participants will learn practical ways to adapt and manage aches and pains, and to minimize their effect on regular exercise. Patients will learn how to maintain good posture while sitting, walking, and lifting.  Balance Training and Fall Prevention  Clinical staff led group instruction and group discussion with PowerPoint presentation and patient guidebook. To enhance the learning environment the use of posters, models and videos may be added. At the conclusion of this workshop, patients will understand the importance of their sensorimotor skills (vision, proprioception, and the vestibular system) in maintaining their ability to balance as they age. Patients will apply a variety of balancing exercises that are appropriate for their current level of function. Patients will understand the common causes for poor balance, possible solutions to these problems, and ways to modify their physical environment in order to minimize their fall risk. The purpose of this lesson is to teach patients about the  importance of maintaining balance as they age and ways to minimize their risk of falling.  WORKSHOPS   Nutrition:  Fueling a Scientist, research (physical sciences) led group instruction and group discussion with PowerPoint presentation and patient guidebook. To enhance the learning environment the use of posters, models and videos may be added. Patients will review the foundational principles of the [place] and understand what constitutes a serving size in each of the food groups. Patients will also learn Pritikin-friendly foods that are better choices when away from home and review make-ahead meal and snack options. Calorie density will be reviewed and applied to three nutrition  priorities: weight maintenance, weight loss, and weight gain. The purpose of this lesson is to reinforce (in a group setting) the key concepts around what patients are recommended to eat and how to apply these guidelines when away from home by planning and selecting Pritikin-friendly options. Patients will understand how calorie density may be adjusted for different weight management goals.  Mindful Eating  Clinical staff led group instruction and group discussion with PowerPoint presentation and patient guidebook. To enhance the learning environment the use of posters, models and videos may be added. Patients will briefly review the concepts of the [place] and the importance of low-calorie dense foods. The concept of mindful eating will be introduced as well as the importance of paying attention to internal hunger signals. Triggers for non-hunger eating and techniques for dealing with triggers will be explored. The purpose of this lesson is to provide patients with the opportunity to review the basic principles of the [place], discuss the value of eating mindfully and how to measure internal cues of hunger and fullness using the Hunger Scale. Patients will also discuss reasons for non-hunger eating and  learn strategies to use for controlling emotional eating.  Targeting Your Nutrition Priorities Clinical staff led group instruction and group discussion with PowerPoint presentation and patient guidebook. To enhance the learning environment the use of posters, models and videos may be added. Patients will learn how to determine their genetic susceptibility to disease by reviewing their family history. Patients will gain insight into the importance of diet as part of an overall healthy lifestyle in mitigating the impact of genetics and other environmental insults. The purpose of this lesson is to provide patients with the opportunity to assess their personal nutrition priorities by looking at their family history, their own health history and current risk factors. Patients will also be able to discuss ways of prioritizing and modifying the [place] for their highest risk areas  Menu  Clinical staff led group instruction and group discussion with PowerPoint presentation and patient guidebook. To enhance the learning environment the use of posters, models and videos may be added. Using menus brought in from [employer], or printed from [employer], patients will apply the [place] dining out guidelines that were presented in the [employer] video. Patients will also be able to practice these guidelines in a variety of provided scenarios. The purpose of this lesson is to provide patients with the opportunity to practice hands-on learning of the [place] with actual menus and practice scenarios.  Label Reading Clinical staff led group instruction and group discussion with PowerPoint presentation and patient guidebook. To enhance the learning environment the use of posters, models and videos may be added. Patients will review and discuss the Pritikin label reading guidelines presented in Pritikin's Label Reading Educational series video. Using fool  labels brought in from local grocery stores and markets, patients will apply the label reading guidelines and determine if the packaged food meet the Pritikin guidelines. The purpose of this lesson is to provide patients with the opportunity to review, discuss, and practice hands-on learning of the Pritikin Label Reading guidelines with actual packaged food labels. [place] Workshops are designed to teach patients ways to prepare quick, simple, and affordable recipes at home. The importance of nutrition's role in chronic disease risk reduction is reflected in its emphasis in the overall Pritikin program. By learning how to prepare essential core Pritikin Eating Plan recipes,  patients will increase control over what they eat; be able to customize the flavor of foods without the use of added salt, sugar, or fat; and improve the quality of the food they consume. By learning a set of core recipes which are easily assembled, quickly prepared, and affordable, patients are more likely to prepare more healthy foods at home. These workshops focus on convenient breakfasts, simple entres, side dishes, and desserts which can be prepared with minimal effort and are consistent with nutrition recommendations for cardiovascular risk reduction. Cooking [employer] are taught by a Engineer, materials (RD) who has been trained by the [employer]. The chef or RD has a clear understanding of the importance of minimizing - if not completely eliminating - added fat, sugar, and sodium in recipes. Throughout the series of [place] Workshop sessions, patients will learn about healthy ingredients and efficient methods of cooking to build confidence in their capability to prepare    Cooking School weekly topics:  Adding Flavor- Sodium-Free  Fast and Healthy Breakfasts  Powerhouse Plant-Based Proteins  Satisfying Salads and Dressings  Simple Sides and  Sauces  International Cuisine-Spotlight on the [place] Zones  Delicious Desserts  Savory Soups  Efficiency Cooking - Meals in a Snap  Tasty Appetizers and Snacks  Comforting Weekend Breakfasts  One-Pot Wonders   Fast Evening Meals  Easy [place] (Psychosocial): New Thoughts, New Behaviors Clinical staff led group instruction and group discussion with PowerPoint presentation and patient guidebook. To enhance the learning environment the use of posters, models and videos may be added. Patients will learn and practice techniques for developing effective health and lifestyle goals. Patients will be able to effectively apply the goal setting process learned to develop at least one new personal goal.  The purpose of this lesson is to expose patients to a new skill set of behavior modification techniques such as techniques setting SMART goals, overcoming barriers, and achieving new thoughts and new behaviors.  Managing Moods and Relationships Clinical staff led group instruction and group discussion with PowerPoint presentation and patient guidebook. To enhance the learning environment the use of posters, models and videos may be added. Patients will learn how emotional and chronic stress factors can impact their health and relationships. They will learn healthy ways to manage their moods and utilize positive coping mechanisms. In addition, ICR patients will learn ways to improve communication skills. The purpose of this lesson is to expose patients to ways of understanding how one's mood and health are intimately connected. Developing a healthy outlook can help build positive relationships and connections with others. Patients will understand the importance of utilizing effective communication skills that include actively listening and being heard. They will learn and understand the importance of the "4 Cs" and especially Connections in  fostering of a Healthy Mind-Set.  Healthy Sleep for a Healthy Heart Clinical staff led group instruction and group discussion with PowerPoint presentation and patient guidebook. To enhance the learning environment the use of posters, models and videos may be added. At the conclusion of this workshop, patients will be able to demonstrate knowledge of the importance of sleep to overall health, well-being, and quality of life. They will understand the symptoms of, and treatments for, common sleep disorders. Patients will also be able to identify daytime and nighttime behaviors which impact sleep, and they will be able to apply these tools to help manage sleep-related challenges. The purpose of this  lesson is to provide patients with a general overview of sleep and outline the importance of quality sleep. Patients will learn about a few of the most common sleep disorders. Patients will also be introduced to the concept of "sleep hygiene," and discover ways to self-manage certain sleeping problems through simple daily behavior changes. Finally, the workshop will motivate patients by clarifying the links between quality sleep and their goals of heart-healthy living.   Recognizing and Reducing Stress Clinical staff led group instruction and group discussion with PowerPoint presentation and patient guidebook. To enhance the learning environment the use of posters, models and videos may be added. At the conclusion of this workshop, patients will be able to understand the types of stress reactions, differentiate between acute and chronic stress, and recognize the impact that chronic stress has on their health. They will also be able to apply different coping mechanisms, such as reframing negative self-talk. Patients will have the opportunity to practice a variety of stress management techniques, such as deep abdominal breathing, progressive muscle relaxation, and/or guided imagery.  The purpose of this lesson is to  educate patients on the role of stress in their lives and to provide healthy techniques for coping with it.  Learning Barriers/Preferences:  Learning Barriers/Preferences - 11/21/21 1349       Learning Barriers/Preferences   Learning Barriers Sight   wears glasses   Learning Preferences Group Instruction;Individual Instruction;Skilled Demonstration             Education Topics:  Knowledge Questionnaire Score:  Knowledge Questionnaire Score - 11/21/21 1344       Knowledge Questionnaire Score   Pre Score 21/24             Core Components/Risk Factors/Patient Goals at Admission:  Personal Goals and Risk Factors at Admission - 11/21/21 1348       Core Components/Risk Factors/Patient Goals on Admission    Weight Management Yes;Obesity;Weight Loss    Intervention Weight Management: Develop a combined nutrition and exercise program designed to reach desired caloric intake, while maintaining appropriate intake of nutrient and fiber, sodium and fats, and appropriate energy expenditure required for the weight goal.;Weight Management: Provide education and appropriate resources to help participant work on and attain dietary goals.;Weight Management/Obesity: Establish reasonable short term and long term weight goals.;Obesity: Provide education and appropriate resources to help participant work on and attain dietary goals.    Admit Weight 201 lb 15.1 oz (91.6 kg)    Expected Outcomes Short Term: Continue to assess and modify interventions until short term weight is achieved;Long Term: Adherence to nutrition and physical activity/exercise program aimed toward attainment of established weight goal;Weight Loss: Understanding of general recommendations for a balanced deficit meal plan, which promotes 1-2 lb weight loss per week and includes a negative energy balance of [phone] kcal/d;Understanding recommendations for meals to include 15-35% energy as protein, 25-35% energy from fat, 35-60%  energy from carbohydrates, less than 200mg  of dietary cholesterol, 20-35 gm of total fiber daily;Understanding of distribution of calorie intake throughout the day with the consumption of 4-5 meals/snacks    Diabetes Yes    Intervention Provide education about signs/symptoms and action to take for hypo/hyperglycemia.;Provide education about proper nutrition, including hydration, and aerobic/resistive exercise prescription along with prescribed medications to achieve blood glucose in normal ranges: Fasting glucose 65-99 mg/dL    Expected Outcomes Long Term: Attainment of HbA1C < 7%.;Short Term: Participant verbalizes understanding of the signs/symptoms and immediate care of hyper/hypoglycemia, proper foot care and importance of medication,  aerobic/resistive exercise and nutrition plan for blood glucose control.    Hypertension Yes    Intervention Provide education on lifestyle modifcations including regular physical activity/exercise, weight management, moderate sodium restriction and increased consumption of fresh fruit, vegetables, and low fat dairy, alcohol moderation, and smoking cessation.;Monitor prescription use compliance.    Expected Outcomes Short Term: Continued assessment and intervention until BP is < 140/75mm HG in hypertensive participants. < 130/80mm HG in hypertensive participants with diabetes, heart failure or chronic kidney disease.;Long Term: Maintenance of blood pressure at goal levels.    Lipids Yes    Intervention Provide education and support for participant on nutrition & aerobic/resistive exercise along with prescribed medications to achieve LDL 70mg , HDL >40mg .    Expected Outcomes Short Term: Participant states understanding of desired cholesterol values and is compliant with medications prescribed. Participant is following exercise prescription and nutrition guidelines.;Long Term: Cholesterol controlled with medications as prescribed, with individualized exercise RX and with  personalized nutrition plan. Value goals: LDL < 70mg , HDL > 40 mg.             Core Components/Risk Factors/Patient Goals Review:    Core Components/Risk Factors/Patient Goals at Discharge (Final Review):    ITP Comments:  ITP Comments     Row Name 11/21/21 0830 12/06/21 0827         ITP Comments Medical Director- Dr. Fransico Him, MD 30 day ITP Review. Chelbi is supposed to start exercise this week at intensive cardiac rehab               Comments: See ITP comments.Harrell Gave RN BSN

## 2021-12-08 ENCOUNTER — Ambulatory Visit (HOSPITAL_COMMUNITY): Payer: BC Managed Care – PPO

## 2021-12-11 ENCOUNTER — Ambulatory Visit (HOSPITAL_COMMUNITY): Payer: BC Managed Care – PPO

## 2021-12-13 ENCOUNTER — Ambulatory Visit (HOSPITAL_COMMUNITY): Payer: BC Managed Care – PPO

## 2021-12-15 ENCOUNTER — Telehealth (HOSPITAL_COMMUNITY): Payer: Self-pay | Admitting: *Deleted

## 2021-12-15 ENCOUNTER — Ambulatory Visit (HOSPITAL_COMMUNITY): Payer: BC Managed Care – PPO

## 2021-12-15 ENCOUNTER — Encounter (HOSPITAL_COMMUNITY): Payer: Self-pay | Admitting: *Deleted

## 2021-12-15 DIAGNOSIS — Z955 Presence of coronary angioplasty implant and graft: Secondary | ICD-10-CM

## 2021-12-15 NOTE — Telephone Encounter (Signed)
Patient was called and notified that she has been discharged from cardiac rehab at this time.Harrell Gave RN BSN

## 2021-12-15 NOTE — Progress Notes (Signed)
Morgan [name] has not attended any exercise classes since orientation therefore will discharge from intensive cardiac rehab at this time. Patient attended orientation on 11/21/21 only.Barnet Pall, RN,BSN 12/15/2021 9:31 AM

## 2021-12-18 ENCOUNTER — Ambulatory Visit (HOSPITAL_COMMUNITY): Payer: BC Managed Care – PPO

## 2021-12-20 ENCOUNTER — Ambulatory Visit (HOSPITAL_COMMUNITY): Payer: BC Managed Care – PPO

## 2021-12-22 ENCOUNTER — Ambulatory Visit (HOSPITAL_COMMUNITY): Payer: BC Managed Care – PPO

## 2021-12-25 ENCOUNTER — Ambulatory Visit (HOSPITAL_COMMUNITY): Payer: BC Managed Care – PPO

## 2021-12-27 ENCOUNTER — Ambulatory Visit (HOSPITAL_COMMUNITY): Payer: BC Managed Care – PPO

## 2021-12-29 ENCOUNTER — Ambulatory Visit (HOSPITAL_COMMUNITY): Payer: BC Managed Care – PPO

## 2022-01-01 ENCOUNTER — Ambulatory Visit (HOSPITAL_COMMUNITY): Payer: BC Managed Care – PPO

## 2022-01-03 ENCOUNTER — Ambulatory Visit (HOSPITAL_COMMUNITY): Payer: BC Managed Care – PPO

## 2022-01-05 ENCOUNTER — Ambulatory Visit (HOSPITAL_COMMUNITY): Payer: BC Managed Care – PPO

## 2022-01-08 ENCOUNTER — Ambulatory Visit (HOSPITAL_COMMUNITY): Payer: BC Managed Care – PPO

## 2022-01-10 ENCOUNTER — Ambulatory Visit (HOSPITAL_COMMUNITY): Payer: BC Managed Care – PPO

## 2022-01-12 ENCOUNTER — Ambulatory Visit (HOSPITAL_COMMUNITY): Payer: BC Managed Care – PPO

## 2022-01-15 ENCOUNTER — Ambulatory Visit (HOSPITAL_COMMUNITY): Payer: BC Managed Care – PPO

## 2022-01-17 ENCOUNTER — Ambulatory Visit (HOSPITAL_COMMUNITY): Payer: BC Managed Care – PPO

## 2022-01-19 ENCOUNTER — Ambulatory Visit (HOSPITAL_COMMUNITY): Payer: BC Managed Care – PPO

## 2022-01-22 ENCOUNTER — Ambulatory Visit (HOSPITAL_COMMUNITY): Payer: BC Managed Care – PPO

## 2022-01-24 ENCOUNTER — Ambulatory Visit (HOSPITAL_COMMUNITY): Payer: BC Managed Care – PPO

## 2022-01-26 ENCOUNTER — Ambulatory Visit (HOSPITAL_COMMUNITY): Payer: BC Managed Care – PPO

## 2022-05-02 ENCOUNTER — Ambulatory Visit: Payer: BC Managed Care – PPO | Admitting: Neurology

## 2022-05-30 NOTE — Therapy (Signed)
[facility] [address] [place], [place], [address] Phone: [phone]   Fax:  [phone]  Patient Details  Name: Morgan [name] MRN: [id number] Date of Birth: [birth year] Referring Provider:  Albertine Patricia, MD  Encounter Date: 05/30/2022  SPEECH THERAPY DISCHARGE SUMMARY  Visits from Start of Care: 6  Current functional level related to goals / functional outcomes: Pt did not return after 6th visit. See note from 10-26-21. Goals at her last attended ST session on 10-16-21, below, were not checked due to unplanned d/c from [place]: Target date: 10/18/2021     Pt will improve expressive/verbal pragmatic skills by decr'ing hyperverbosity in >50% of opportunities with SLP min nonverbal cue in 3 sessions Baseline: Goal status: Deferred - pt premorbid personality   2.  Pt will demo functional receptive language skills to follow multi-step directions independently using compensations (look at speaker, etc) in 3 sessions Baseline: 10/10/21, 10/16/21 Goal status: Ongoing   3.  Pt will verbalize strategies for incr'd memory for household tasks such as filling her pillbox in 2 sessions Baseline: 10-10-21 Goal status: Ongoing     LONG TERM GOALS: Target date: 11/17/21   Pt will improve expressive/verbal pragmatic skills by decr'ing hyperverbosity in >90% of opportunities with SLP min nonverbal cue in 3 sessions Baseline:  Goal status: Deferred - personality   2.  Pt will verbalize how she compensated for memory between 4 sessions Baseline:  Goal status: Deferred - pt states memory near-baseline   3.  Pt will demo [facility] skills with written language comprehension with memory compensations in 3 sessions Baseline:  Goal status: Ongoing   4.  Pt will verbalize strategies she can use for areas she may find more challenging at work, in 3 sessions Baseline: 10-10-21, 10-16-21 Goal status: Ongoing   5.  Pt  will score higher on PROM at end of ST course than her original score in the first 2 sessions of ST Baseline:  Goal status: Ongoing   ASSESSMENT:   CLINICAL IMPRESSION: Patient is a 52 y.o. female who was seen today for ST for improving cognitive communication skills, which pt states are almost normal again. Joylynn maintains that her cognitive-linguistic skills remain near-baseline. SLP deferred pt's goal for decr verbosity due to likely premorbid personality. See ST note above. 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 Pt decr'd to once/week due to progress and reports of no difficulty with daily tasks such as realty classes and organization of schedule between temp work and therapy appointments.Discharge likely 2 visits.   Remaining deficits: Unknown as pt self-d/c'd from [place] / Equipment: See ST notes.   Patient agrees to discharge. Patient goals were not met. Patient is being discharged due to not returning since the last visit.Marland Kitchen    [place], [place] 05/30/2022, 4:08 PM  [facility] [address] [place], [place], [address] Phone: [phone]   Fax:  [phone]

## 2022-06-11 ENCOUNTER — Encounter: Payer: Self-pay | Admitting: *Deleted

## 2022-08-23 ENCOUNTER — Ambulatory Visit: Payer: BC Managed Care – PPO | Admitting: Neurology

## 2023-03-15 LAB — LAB REPORT - SCANNED: EGFR: 34

## 2023-04-03 ENCOUNTER — Ambulatory Visit: Payer: BC Managed Care – PPO | Admitting: Neurology

## 2023-04-17 ENCOUNTER — Ambulatory Visit: Payer: BC Managed Care – PPO | Admitting: Neurology

## 2023-05-21 ENCOUNTER — Telehealth: Payer: Self-pay | Admitting: Neurology

## 2023-05-21 NOTE — Telephone Encounter (Signed)
 Pt rescheduled appointment due to have another doctor appointment at [facility]

## 2023-05-28 ENCOUNTER — Ambulatory Visit: Payer: BC Managed Care – PPO | Admitting: Family Medicine

## 2023-09-17 NOTE — ED Provider Notes (Signed)
 [facility] EMERGENCY DEPT  ED Provider Note  Chief Complaint  Patient presents with  . Abnormal Lab    HPI   Morgan [name] is a 53 y.o. 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   Patient reports over the last month she has been feeling generally weak and fatigued as well as noticed poor p.o.'s while using Ozempic and Mounjaro.  Patient reports that she was sent in for biopsy by her outpatient nephrologist with concern for transplanted kidney function.  She denies any recent infections, chest pain, shortness of breath, abdominal pain, dysuria or hematuria, lower extremity edema, paresthesias or new focal weakness.  Of note, she reports she has not taken any of her home medications prior to biopsy earlier today but was told that she had elevated blood sugars that were concerning while in the outpatient interventional radiology office.  Interpreter used: no Historian: patient and past medical records  Past Medical History:  Diagnosis Date  . Condyloma acuminata 04/28/2018  . Diabetic, retinopathy, proliferative (CMS/HHS-HCC) 09/16/2015  . ESRD on hemodialysis (CMS/HHS-HCC) 09/16/2015  . Hypercholesterolemia   . Hyperlipidemia   . Hyperparathyroidism due to renal insufficiency (CMS/HHS-HCC)    secondary anemia  . Hypertension   . Kidney stone    h/o previous left kidney stone with recurrence  . Uncontrolled type 2 diabetes mellitus with chronic kidney disease on chronic dialysis, with long-term current use of insulin  09/16/2015  . Vulvar dysplasia 04/28/2018    Past Surgical History:  Procedure Laterality Date  . VULVAR BIOPSY   04/28/2018  . SURVEILLANCE COLONOSCOPY N/A 06/11/2018   Procedure: SURVEILLANCE COLONOSCOPY, FLEXIBLE; DIAGNOSTIC, INCLUDING COLLECTION OF SPECIMEN(S) BY BRUSHING OR WASHING, WHEN PERFORMED (SEPARATE PROCEDURE);  Surgeon: Elta Fonda Mt, MD;  Location: [facility];  Service: Gastroenterology;  Laterality: N/A;  . VULVECTOMY N/A 07/30/2018   Procedure: VULVECTOMY SIMPLE; PARTIAL;  Surgeon: Caye Leita Gunner, MD;  Location: [facility];  Service: Gynecology;  Laterality: N/A;  . PELVIC EXAMINATION UNDER ANESTHESIA N/A 07/30/2018   Procedure: PELVIC EXAMINATION UNDER ANESTHESIA (OTHER THAN LOCAL);  Surgeon: Caye Leita Gunner, MD;  Location[facility];  Service: Gynecology;  Laterality: N/A;  . DESTRUCTION VULVA LESIONS N/A 07/30/2018   Procedure: DESTRUCTION OF LESION(S), VULVA; SIMPLE (CO2 LASER);  Surgeon: Caye Leita Gunner, MD;  Location: [facility] OR;  Service: Gynecology;  Laterality: N/A;  . DESTRUCTION ANAL LESIONS N/A 07/30/2018   Procedure: DESTRUCTION OF LESION(S), ANUS (EG, CONDYLOMA, PAPILLOMA, MOLLUSCUM CONTAGIOSUM, HERPETIC VESICLE), EXTENSIVE (CO2 SER SURGERY);  Surgeon: Florie Lonni Centers, MD;  Location: [facility] OR;  Service: General Surgery;  Laterality: N/A;  Excision of anal condyloma  . VULVECTOMY N/A 07/15/2019   Procedure: VULVECTOMY SIMPLE; PARTIAL;  Surgeon: Caye Leita Gunner, MD;  Location: [facility] OR;  Service: Gynecology;  Laterality: N/A;  . COLPOSCOPY VULVULAR N/A 07/15/2019   Procedure: COLPOSCOPY OF THE VULVA;  Surgeon: Caye Leita Gunner, MD;  Location: [facility] OR;  Service: Gynecology;  Laterality: N/A;  Possible Biopsies  . DESTRUCTION VULVA LESIONS N/A 07/15/2019   Procedure: DESTRUCTION OF LESION(S), VULVA; SIMPLE (EG, LASER SURGERY, ELECTROSURGERY, CRYOSURGERY, CHEMOSURGERY);  Surgeon: Caye Leita Gunner, MD;  Location: [facility] OR;  Service: Gynecology;  Laterality: N/A;  .  PELVIC EXAMINATION UNDER ANESTHESIA  N/A 07/15/2019   Procedure: PELVIC EXAMINATION UNDER ANESTHESIA (OTHER THAN LOCAL);  Surgeon: Caye Leita Gunner, MD;  Location: [facility] OR;  Service: Gynecology;  Laterality: N/A;  . ARTERIOVENOUS GRAFT PLACEMENT Left    brachiocephalic  . CESAREAN SECTION    . CREATION ARTERIOVENOUS FISTULA Left    brachiocephalic vein transposition  . CYSTOURETHROSCOPY W/REMOVAL FOREIGN BODY/CALCULUS/URETERAL STENT N/A 06/25/2016   Procedure: CYSTOURETHROSCOPY, WITH REMOVAL OF FOREIGN BODY, CALCULUS, OR URETERAL STENT FROM URETHRA OR BLADDER (SEPARATE PROCEDURE); SIMPLE;  Surgeon: Carlin Berg Scales Mickey., MD;  Location: [facility];  Service: Urology;  Laterality: N/A;  transplant kidney  . EYE SURGERY Bilateral    laser surgery for retinal detachment  . LAPAROSCOPIC CHOLECYSTECTOMY  09/2014  . revision of arteriovenous goretex graft Left 2017  . 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 Cervical cancer Mother   . Stroke Maternal Grandmother   . Myocardial Infarction (Heart attack) Paternal Grandfather   . Anesthesia problems Neg Hx   . Malignant hyperthermia Neg Hx     Social History   Socioeconomic History  . Marital status: Married  Occupational History  . Occupation: real estate   Tobacco Use  . Smoking status: Never    Passive exposure: Never  . Smokeless tobacco: Never  Vaping Use  . Vaping status: Never Used  Substance and Sexual Activity  . Alcohol use: No  . Drug use: No  . Sexual activity: Yes   Social Drivers of Health   Financial Resource Strain: Medium Risk (03/28/2021)   Overall Financial Resource Strain (CARDIA)   . Difficulty of Paying Living Expenses: Somewhat hard  Food Insecurity: No Food Insecurity  (03/28/2021)   Hunger Vital Sign   . Worried About Programme researcher, broadcasting/film/video in the Last Year: Never true   . Ran Out of Food in the Last Year: Never true  Transportation Needs: No Transportation Needs (03/28/2021)   PRAPARE - Transportation   . Lack of Transportation (Medical): No   . Lack of Transportation (Non-Medical): No  Physical Activity: Insufficiently Active (06/21/2020)   Exercise Vital Sign   . Days of Exercise per Week: 3 days   . Minutes of Exercise per Session: 30 min  Stress: Stress Concern Present (06/21/2020)   [employer] of Occupational Health - Occupational Stress Questionnaire   . Feeling of Stress : Rather much  Housing Stability: Unknown (06/03/2023)   Housing Stability Vital Sign   . Homeless in the Last Year: No    Review of Systems   Positive for fatigue. All other review of systems are negative except for those documented in the HPI.   Physical Exam   BP 135/41 (BP Location: Right calf, Patient Position: Lying)   Pulse 91   Temp 36.5 C (97.7 F) (Tympanic)   Resp 17   LMP  (LMP Unknown) Comment: sts no menses in 4-5 years  SpO2 99%   Physical Exam Vitals and nursing note reviewed.  Constitutional:      General: She is not in acute distress.    Appearance: She is well-developed.  HENT:     Head: Normocephalic and atraumatic.  Eyes:     Conjunctiva/sclera: Conjunctivae normal.  Cardiovascular:     Rate and Rhythm: Normal  rate and regular rhythm.     Heart sounds: No murmur heard.    Comments: BUE grafts palpated with appreciated thrill to RUE Pulmonary:     Effort: Pulmonary effort is normal. No respiratory distress.     Breath sounds: Normal breath sounds.  Abdominal:     Palpations: Abdomen is soft.     Tenderness: There is no abdominal tenderness.  Musculoskeletal:        General: No swelling.     Cervical back: Neck supple.  Skin:    General: Skin is warm and dry.     Capillary Refill: Capillary refill takes less than 2 seconds.   Neurological:     Mental Status: She is alert.  Psychiatric:        Mood and Affect: Mood normal.     Procedures  Procedures  Medical Decision Making     MEDICAL COMPLEXITY  This is a face-to-face encounter in which all nursing notes, pertinent labs, x-rays, past medical history, previous chart history, and clinical data have been independently reviewed, acted upon, and discussed with the patient and/or guardian.  MDM  53 y.o. female with history as above who presents to the Emergency Department today for abnormal lab. Exam as above. Patient otherwise hemodynamically stable.  DDX (includes but not limited to): Anemia, dehydration, AKI, metabolic derangement, cystitis, pyelonephritis, organ rejection.  Consider hyperglycemia although likely in the setting of noncompliance with medications prior to procedure this morning.  Plan: - CBC, CMP, UA, mag, beta hydroxybutyrate, shock - Ultrasound renal transplant - Touch base with patient's nephrologist - Final disposition pending workup and imaging   ED Course:  ED Course as of 09/17/23 1821  Tue Sep 17, 2023  1607 POC Glucose: 94 Patient reports prior to biopsy she had not taken any of her diabetic medications and following measurement of BG in OP clinic, she took home dose of insulin  therapy prior to this POC  [VR]  1705 Discussed with transplant team who agree with US . Transplant to discuss with team and follow-up repeat lab work. Likely recommend GM admission [VR]  1706 pH, Venous: 7.34 [VR]  1706 Lactate, Venous: 1.7 [VR]  1720 WBC: 7.4 [VR]  1720 Hematocrit: 39.7 [VR]  1720 Platelet Count /L: 328 [VR]  1732 Per my discussion with transplant team, no acute concerns. If any abnormalities, recommend GM consult for admission and transplant can be consulted at that point. Believe Cr elevated 2/2 to chronic conditions, low suspicion for rejection.  [VR]  1741 Magnesium: 2.2 [VR]  1741 Sodium: 140 [VR]  1741 BUN(!): 56 [VR]   1741 Creatinine(!): 3.8 [VR]  1748 Discussed with Dr. Melanee, patient's OP nephrologist. Reports creatinine continues to increase prompting biopsy. Given patient's change in baseline, recommend GM medicine. Concern for need to treat for results of biopsy. Will discuss with Gen Med.  [VR]  1820 Beta-Hydroxybutyrate: <0.18 [VR]    ED Course User Index [VR] Jamel Jorie Reams, PA       ED Clinical Impression  1. AKI (acute kidney injury) ()                Medications for this Visit   Medications - No data to display  Results for this Visit   Results for orders placed or performed during the hospital encounter of 09/17/23  Shock Panel, Venous  Result Value Ref Range   Patient Temperature, Venous 37.0 C   FIO2, Venous     pH, Venous 7.34 7.32 - 7.42   PCO2,  Venous 36 (L) 39 - 55 mmHg   PO2, Venous 48 30 - 55 mmHg   Base Excess, Venous -6 (L) -3 - 3 mmol/L   Bicarbonate, Venous 19 (L) 20 - 28 mmol/L   CO2 TOTAL, VENOUS 21 21 - 30 mmol/L   Hemoglobin, Venous 13.7 11.7 - 15.5 g/dL   Hematocrit, Venous 58.9 35.0 - 45.0 %   % O2 Hemoglobin, Venous 80.6 60.0 - 85.0 %   % CO Hemoglobin, Venous 1.8 <=2.0 %   % Methemoglobin, Venous 0.4 0.4 - 1.5 %   Volume % O2, Venous 15.5 7.0 - 18.0 %   Sodium, Venous 138 135 - 145 mmol/L   Potassium, Venous 4.1 3.2 - 4.8 mmol/L   Glucose, Nonfasting, Venous 105 70 - 140 mg/dL   Calcium , Ionized 1.29 1.15 - 1.32 mmol/L   Lactate, Venous 1.7 0.6 - 2.2 mmol/L  POC GLUCOSE  Result Value Ref Range   POC Glucose 94 70 - 140 mg/dL   RALS Comment Nurse Notified    Narrative   POC TEST(S) ABOVE PERFORMED AT THE PATIENT CARE LOCATION AND OVERSEEN BY THE [facility] POCT PROGRAM.   US  renal transplant complete    (Results Pending)    Jorie Sawyer, PA-C Department of Emergency Medicine [facility]  Portions of the record created with voice recognition software. Occasional wrong-word or sound-a-like substitutions may have occurred  due to the inherent limitations of voice recognition software. Please read the chart carefully and recognize, using context, where substitutions have occurred.    This note is not finalized until signed. When signed, this note may contain results that were not available to me during my time assigned as responding clinician to patient as note auto refreshes before signing after end of shift. Please reference final result times for results available to me during my time as responding clinician.   This note has been created using automated tools and reviewed for accuracy by VISHAL PIYUSH RUPARELIA.   Sawyer Jorie Reams, [place] 09/30/23 [phone]

## 2023-09-17 NOTE — H&P (Signed)
 Hospital Medicine Admission History & Physical  Time of Service: 09/18/2023, 6:29 AM  PCP: Kassie Alyce Sayre, MD, Phone None, Fax None   Chief Complaint  Elevated sCr, hyperglycemia  History of Present Illness  Morgan [name] is a 53 y.o. female with PMH significant for ESRD on HD s/p DDKT 04/2016, PAD on ASA, type 2 DM, CVA (04/2021 with mild residual deficits of R fingers 1-3 weakness), HTN p/f renal bx with elevated sCr to 4.1 and hyperglycemia to 300s.  Follows with [facility] (Dr. Melanee) with known baseline sCr ~1.5 with ongoing evaluation for up-trending sCr in outpatient setting. Underwent US  renal on 09/10/23 with findings of no hydronephrosis. Patent renal vessels with normal velocities and normal parenchymal RI values. Subsequently presented to IR for renal bx early in day on 09/17/23 and found to have sCr 4.1 with BG 370. Underwent bx without complication and advised to present to [facility] ED for further evaluation of ongoing elevations in sCr and management of hyperglycemia. Notably, took home medications (IS, insulin ) in between bx and presenting to ED.  Patient also follow with [facility] for PAD. Per record review, patient was noted to have an abnormal stress test in March 2025 with anterior lateral perfusion abnormality concerning for ischemia.  However, had not undergone subsequent catheterization.  Most recently followed up with cardiologist on 09/11/2023 with worsening creatinine to 3.22 from baseline 1.5.  Catheterization was subsequently deferred until further nephrology workup.   Upon discussion with patient, she endorses that she has overall been in her usual state of health, noting increase in malaise, fatigue, and poor PO intake since switch from Ozemipc to Mounjaro over the last few months. Also endorses constipation, but no vomiting or diarrhea, on day of taking Mounjaro. PO intake has been poor but she drinks 4x32oz glasses of water  daily. Endorses increased  thirst and polydipsia, which causes her to urinate frequently. Otherwise denies fevers, chills, CP, SOB (except for one episode earlier this month in setting of climbing stairs), abdominal pain, dysuria, hematuria, LE edema.    States that she has been compliant with all of her medications, save for a weeklong lapse in her insulin  (30 units Tresiba  QD) due to insurance/pharmacy issues. She has been off her her DAPT for 1 week preceding biopsy; however, per Cardiology note 09/11/23, was previously prescribed Plavix  in March 2025 which was switched to Brilinta  that was ultimately discontinued. Has been adherent with her Cellcept , prednisone , and tacrolimus . Takes Tylenol  occasionally for pain but does not take NSAIDs (except ASA), BC Powder, Goody Powder.  Social history: Lives in [place], [place]  In the ED, vitals were T 37 C (98.6 F), HR 85, BP (!) 85/30, RR 14, and satting 97 % on RA. BMP notable for Na 140, 138, K 4.2, HCO3 17*, BUN 56*, Cr 3.8*, glucose 104. CBC notable for WBC 7.4, Hgb 13.1, plts 328. BP subsequetly 135/70s without intervention. Other labs were notable for FK 6.1, BHB <0.18, repeat POC BG 79.  She was subsequently admitted to General Medicine for further evaluation and management.  Medical History  Past Medical History Past Medical History:  Diagnosis Date  . Condyloma acuminata 04/28/2018  . Diabetic, retinopathy, proliferative (CMS/HHS-HCC) 09/16/2015  . ESRD on hemodialysis (CMS/HHS-HCC) 09/16/2015  . Hypercholesterolemia   . Hyperlipidemia   . Hyperparathyroidism due to renal insufficiency (CMS/HHS-HCC)    secondary anemia  . Hypertension   . Kidney stone    h/o previous left kidney stone with recurrence  . Uncontrolled type  2 diabetes mellitus with chronic kidney disease on chronic dialysis, with long-term current use of insulin  09/16/2015  . 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 VULVAR  BIOPSY  04/28/2018  . SURVEILLANCE COLONOSCOPY N/A 06/11/2018   Procedure: SURVEILLANCE COLONOSCOPY, FLEXIBLE; DIAGNOSTIC, INCLUDING COLLECTION OF SPECIMEN(S) BY BRUSHING OR WASHING, WHEN PERFORMED (SEPARATE PROCEDURE);  Surgeon: Elta Fonda Mt, MD;  Location: [facility];  Service: Gastroenterology;  Laterality: N/A;  . VULVECTOMY N/A 07/30/2018   Procedure: VULVECTOMY SIMPLE; PARTIAL;  Surgeon: Caye Leita Gunner, MD;  Location: [facility];  Service: Gynecology;  Laterality: N/A;  . PELVIC EXAMINATION UNDER ANESTHESIA N/A 07/30/2018   Procedure: PELVIC EXAMINATION UNDER ANESTHESIA (OTHER THAN LOCAL);  Surgeon: Caye Leita Gunner, MD;  Location: [facility] OR;  Service: Gynecology;  Laterality: N/A;  . DESTRUCTION VULVA LESIONS N/A 07/30/2018   Procedure: DESTRUCTION OF LESION(S), VULVA; SIMPLE (CO2 LASER);  Surgeon: Caye Leita Gunner, MD;  Location: [facility] OR;  Service: Gynecology;  Laterality: N/A;  . DESTRUCTION ANAL LESIONS N/A 07/30/2018   Procedure: DESTRUCTION OF LESION(S), ANUS (EG, CONDYLOMA, PAPILLOMA, MOLLUSCUM CONTAGIOSUM, HERPETIC VESICLE), EXTENSIVE (CO2 SER SURGERY);  Surgeon: Florie Lonni Centers, MD;  Location: [facility] OR;  Service: General Surgery;  Laterality: N/A;  Excision of anal condyloma  . VULVECTOMY N/A 07/15/2019   Procedure: VULVECTOMY SIMPLE; PARTIAL;  Surgeon: Caye Leita Gunner, MD;  Location: [facility] OR;  Service: Gynecology;  Laterality: N/A;  . COLPOSCOPY VULVULAR N/A 07/15/2019   Procedure: COLPOSCOPY OF THE VULVA;  Surgeon: Caye Leita Gunner, MD;  Location: [facility];  Service: Gynecology;  Laterality: N/A;  Possible Biopsies  . DESTRUCTION VULVA LESIONS N/A 07/15/2019   Procedure: DESTRUCTION OF LESION(S), VULVA; SIMPLE (EG, LASER SURGERY, ELECTROSURGERY, CRYOSURGERY, CHEMOSURGERY);  Surgeon: Caye Leita Gunner, MD;  Location: [facility] OR;  Service: Gynecology;  Laterality: N/A;  . PELVIC EXAMINATION UNDER  ANESTHESIA N/A 07/15/2019   Procedure: PELVIC EXAMINATION UNDER ANESTHESIA (OTHER THAN LOCAL);  Surgeon: Caye Leita Gunner, MD;  Location: [facility] OR;  Service: Gynecology;  Laterality: N/A;  . ARTERIOVENOUS GRAFT PLACEMENT Left    brachiocephalic  . CESAREAN SECTION    . CREATION ARTERIOVENOUS FISTULA Left    brachiocephalic vein transposition  . CYSTOURETHROSCOPY W/REMOVAL FOREIGN BODY/CALCULUS/URETERAL STENT N/A 06/25/2016   Procedure: CYSTOURETHROSCOPY, WITH REMOVAL OF FOREIGN BODY, CALCULUS, OR URETERAL STENT FROM URETHRA OR BLADDER (SEPARATE PROCEDURE); SIMPLE;  Surgeon: Carlin Berg Scales Mickey., MD;  Location: [facility];  Service: Urology;  Laterality: N/A;  transplant kidney  . EYE SURGERY Bilateral    laser surgery for retinal detachment  . LAPAROSCOPIC CHOLECYSTECTOMY  09/2014  . revision of arteriovenous goretex graft Left 2017  . 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 Cervical cancer Mother    . Stroke Maternal Grandmother    . Myocardial Infarction (Heart attack) Paternal Grandfather    . Anesthesia problems Neg Hx    . Malignant hyperthermia Neg Hx      Social History Social History   Socioeconomic History  . Marital status: Married  Occupational History  . Occupation: real estate   Tobacco Use  . Smoking status: Never    Passive exposure: Never  .  Smokeless tobacco: Never  Vaping Use  . Vaping status: Never Used  Substance and Sexual Activity  . Alcohol use: No  . Drug use: No  . Sexual activity: Yes   Social Drivers of Corporate investment banker Strain: Low Risk  (09/17/2023)   Overall Financial Resource Strain (CARDIA)   . Difficulty of Paying Living Expenses: Not hard  at all  Food Insecurity: No Food Insecurity (09/17/2023)   Hunger Vital Sign   . Worried About Programme researcher, broadcasting/film/video in the Last Year: Never true   . Ran Out of Food in the Last Year: Never true  Transportation Needs: No Transportation Needs (09/17/2023)   PRAPARE - Transportation   . Lack of Transportation (Medical): No   . Lack of Transportation (Non-Medical): No    Allergies & Medications   Allergies  Allergen Reactions  . Fluorescein Shortness Of Breath    Hypertension  . Iodinated Contrast Media Anaphylaxis, Hives and Swelling  . Iohexol  Anaphylaxis, Hives, Itching and Swelling    13 HR PRE-MEDS REQUIRED   . Latex, Natural Rubber Hives and Itching  . Adhesive Tape-Silicones Itching and Rash  . Latex Itching  . Acetaminophen  Other (See Comments)    CANNOT TAKE DUE TO KIDNEY  CANNOT TAKE DUE TO KIDNEY  CANNOT TAKE DUE TO KIDNEY   . Egg Unknown    CANNOT TAKE DUE TO AUTO IMMUNE DISORDER   . Lactose Nausea  . Nsaids (Non-Steroidal Anti-Inflammatory Drug) Unknown    CANNOT TAKE DUE TO KIDNEY   . Shellfish Containing Products Unknown    CANNOT TAKE DUE TO KIDNEY TRANSPLANT   . Sulfa (Sulfonamide Antibiotics) Itching    Other reaction(s): Other (See Comments) CANNOT TAKE DUE TO KIDNEY TRANSPLANT  CANNOT TAKE DUE TO KIDNEY TRANSPLANT  CANNOT TAKE DUE TO KIDNEY TRANSPLANT     Medications Prior to Admission Medications  Prescriptions Last Dose Taking?  FARXIGA 10 mg tablet 09/17/2023 Yes  Sig: Take 10 mg by mouth once daily  PARoxetine  (PAXIL ) 10 MG tablet  No  Sig: Take 10 mg by mouth once daily  amoxicillin-clavulanate (AUGMENTIN) 875-125 mg tablet Not Taking No  Sig: 1 tablet  Patient not taking: Reported on 09/17/2023  aspirin  81 MG EC tablet Past Month Yes  Sig: Take 1 tablet (81 mg total) by mouth once daily.  blood glucose diagnostic test strip  No  Sig: Three times daily  blood glucose meter kit  No  Sig: 3 (three) times daily  blood-glucose sensor (DEXCOM G7  SENSOR) Devi 09/17/2023 Yes  Sig: Use 1 each every 10 (ten) days  clopidogreL  (PLAVIX ) 75 mg tablet Past Month Yes  Sig: Take 75 mg by mouth once daily  diphenhydrAMINE  (BENADRYL ) 25 mg capsule Unknown No  Sig: Take 1 capsule (25 mg total) by mouth once daily as needed for Itching  estradioL (ESTRACE) 0.01 % (0.1 mg/gram) vaginal cream  No  Sig: Place 0.5 g vaginally twice a week  folic acid  (FOLVITE ) 1 MG tablet  No  Sig: Take 1 tablet (1,000 mcg total) by mouth at bedtime  Patient not taking: Reported on 06/03/2023  insulin  ASPART (NOVOLOG  FLEXPEN) pen injector (concentration 100 units/mL) 09/17/2023 Yes  Sig: Inject 20 Units subcutaneously 3 (three) times daily with meals  insulin  DEGLUDEC (TRESIBA  FLEXTOUCH U-200) pen injector (concentration 200 units/mL) 09/17/2023 Yes  Sig: Inject 30 Units subcutaneously once daily  ketoconazole (NIZORAL) 2 % cream  No  Sig: Apply topically twice a day to affected areas on  the ear  Patient not taking: Reported on 02/13/2021  lancing device with lancets kit  No  Sig: Three times daily  loratadine (CLARITIN) 10 mg capsule Unknown No  Sig: Take 10 mg by mouth once daily as needed  multivitamin tablet Unknown No  Sig: Take 1 tablet by mouth once daily  mycophenolate  (CELLCEPT ) 250 mg capsule 09/17/2023 Noon Yes  Sig: Take 2 capsules (500 mg total) by mouth every morning AND 1 capsule (250 mg total) nightly.  mycophenolate  (CELLCEPT ) 250 mg capsule 09/17/2023 Yes  Sig: Take 2 capsules (500 mg total) by mouth once daily AND 1 capsule (250 mg total) at bedtime.  omeprazole (PRILOSEC OTC) 20 MG EC tablet  No  Sig: Take 20 mg by mouth once daily  pen needle, diabetic (BD ULTRA-FINE SHORT PEN NEEDLE) 31 gauge x 5/16 needle Unknown No  Sig: 4 (four) times daily  pen needle, diabetic 31 gauge x 1/4 needle  No  Sig: Use as directed Pt may use 4times daily plus if needed for coverage with sliding scale.  predniSONE  (DELTASONE ) 5 MG tablet 09/17/2023 Yes  Sig:  Take 1 tablet by mouth once daily  psyllium, sugar, (METAMUCIL) 3.4 gram packet Unknown No  Sig: Take 1 packet by mouth once daily  rosuvastatin  (CRESTOR ) 20 MG tablet 09/16/2023 Yes  Sig: Take 1 tablet (20 mg total) by mouth once daily for 90 days  sennosides-docusate (SENOKOT-S) 8.6-50 mg tablet Unknown No  Sig: Take 2 tablets by mouth 2 (two) times daily  Patient taking differently: Take 1 tablet by mouth 2 (two) times daily  sodium bicarbonate  650 MG tablet 09/16/2023 Yes  Sig: Take 2 tablets (1,300 mg total) by mouth 2 (two) times daily (2 in the morning and 2 at night)  tacrolimus  (PROGRAF ) 1 MG capsule 09/17/2023 Yes  Sig: Take 5 capsules (5 mg total) by mouth every 12 (twelve) hours    Facility-Administered Medications: None     Review of Systems  A complete review of systems was performed and is negative except as reviewed in the HPI.  Physical Exam    Current Vital Signs 24h Vital Sign Ranges  T 36.5 C (97.7 F) (09/18/23 0559) Temp  Avg: 36.6 C (97.9 F)  Min: 36.4 C (97.6 F)  Max: 37 C (98.6 F)  BP 111/56 (09/18/23 0559) BP  Min: 85/30  Max: 158/80  HR 94 (09/18/23 0559) Pulse  Avg: 91.5  Min: 85  Max: 96  RR 18 (09/18/23 0559) Resp  Avg: 15.5  Min: 11  Max: 19  O2sat 100 %   SpO2  Avg: 99.1 %  Min: 97 %  Max: 100 %  Weight 84.4 kg (186 lb 1.1 oz) (09/17/23 2109)   Body mass index is 34.03 kg/m. General: alert, cooperative, in NAD Eyes: PERRL, EOMI, conjunctiva clear, anicteric sclera HENT: oropharynx clear, dry mucous membranes CV: regular rate and rhythm, without murmurs, rubs or gallops, systolic murmur LLSB Resp: clear to auscultation, good air exchange Abd: soft, nontender, nondistended, normoactive bowel sounds , bx site with dressing CDI Ext: no lower extremity edema Skin: no rashes or lesions Psych: oriented to time, place and person, mood and affect are appropriate Neuro: CN II-XII intact. Grossly normal and symmetric strength in upper and lower  extremities. Intact sensation to light touch throughout. Normal coordination.  Data   Recent Results (from the past 24 hours)  Complete Blood Count (CBC)   Collection Time: 09/17/23  8:24 AM  Result Value Ref Range  WBC (White Blood Cell Count) 5.4 3.2 - 9.8 x10^9/L   Hemoglobin 13.0 11.7 - 15.5 g/dL   Hematocrit 59.9 64.9 - 45.0 %   Platelets 300 150 - 450 x10^9/L   MCV (Mean Corpuscular Volume) 86 80 - 98 fL   MCH (Mean Corpuscular Hemoglobin) 27.9 26.5 - 34.0 pg   MCHC (Mean Corpuscular Hemoglobin Concentration) 32.5 31.0 - 36.0 %   RBC (Red Blood Cell Count) 4.66 3.77 - 5.16 x10^12/L   RDW-CV (Red Cell Distribution Width) 13.0 11.5 - 14.5 %   NRBC (Nucleated Red Blood Cell Count) 0.00 0 x10^9/L   NRBC % (Nucleated Red Blood Cell %) 0.0 %   MPV (Mean Platelet Volume) 9.9 7.2 - 11.7 fL  Prothrombin Time (INR)   Collection Time: 09/17/23  8:24 AM  Result Value Ref Range   Prothrombin Time 10.5 9.5 - 13.1 sec   Prothrombin INR 0.9 0.9 - 1.1  Hemoglobin A1C   Collection Time: 09/17/23  8:24 AM  Result Value Ref Range   Hemoglobin A1C 10.6 (H) <5.7 %   Average Blood Glucose (Calculated From HgBA1c Level) 270 mg/dL  Comprehensive Metabolic Panel (CMP)   Collection Time: 09/17/23  8:24 AM  Result Value Ref Range   Sodium 136 135 - 145 mmol/L   Potassium 4.7 3.5 - 5.0 mmol/L   Chloride 106 98 - 108 mmol/L   Carbon Dioxide (CO2) 20 (L) 21 - 30 mmol/L   Urea  Nitrogen (BUN) 57 (H) 7 - 20 mg/dL   Creatinine 4.0 (H) 0.4 - 1.0 mg/dL   Glucose 608 (H) 70 - 140 mg/dL   Calcium  10.1 8.7 - 10.2 mg/dL   AST (Aspartate Aminotransferase) 19 15 - 41 U/L   ALT (Alanine Aminotransferase) 25 10 - 39 U/L   Bilirubin, Total 1.1 0.4 - 1.5 mg/dL   Alk Phos (Alkaline Phosphatase) 75 24 - 110 U/L   Albumin  3.7 3.5 - 4.8 g/dL   Protein, Total 7.4 6.2 - 8.1 g/dL   Anion Gap 10 3 - 12 mmol/L   BUN/CREA Ratio 14 6 - 27   Glomerular Filtration Rate (eGFR)  13 mL/min/1.73sq m  Parathyroid Hormone  (PTH)   Collection Time: 09/17/23  8:24 AM  Result Value Ref Range   Parathyroid Hormone (PTH), Intact 368 (H) 14 - 72 pg/mL  25 OH Vitamin D   Collection Time: 09/17/23  8:24 AM  Result Value Ref Range   Vitamin D Total, 25OH 22 (L) 30 - 100 ng/ml  C-Peptide   Collection Time: 09/17/23  8:24 AM  Result Value Ref Range   C-Peptide 3.7 0.7 - 4.4 ng/mL  Lipid Panel W/Calculated Low Density Lipoprotein (LDL) Cholesterol   Collection Time: 09/17/23  8:24 AM  Result Value Ref Range   Cholesterol, Total 145 <200 mg/dL   LDL Calculated 82 <899 mg/dL   HDL 32 mg/dL   Triglyceride 843 (H) <150 mg/dL  POC GLUCOSE   Collection Time: 09/17/23 11:33 AM  Result Value Ref Range   POC Glucose 370 (HH) 70 - 140 mg/dL   RALS Comment Nurse Notified   POC GLUCOSE   Collection Time: 09/17/23 11:35 AM  Result Value Ref Range   POC Glucose 338 (H) 70 - 140 mg/dL  POC GLUCOSE   Collection Time: 09/17/23  1:49 PM  Result Value Ref Range   POC Glucose 234 (H) 70 - 140 mg/dL  POC GLUCOSE   Collection Time: 09/17/23  4:07 PM  Result Value Ref Range  POC Glucose 94 70 - 140 mg/dL   RALS Comment Nurse Notified   Complete Blood Count (CBC) with Differential   Collection Time: 09/17/23  4:44 PM  Result Value Ref Range   WBC (White Blood Cell Count) 7.4 3.2 - 9.8 x10^9/L   Hemoglobin 13.1 11.7 - 15.5 g/dL   Hematocrit 60.2 64.9 - 45.0 %   Platelets 328 150 - 450 x10^9/L   MCV (Mean Corpuscular Volume) 83 80 - 98 fL   MCH (Mean Corpuscular Hemoglobin) 27.5 26.5 - 34.0 pg   MCHC (Mean Corpuscular Hemoglobin Concentration) 33.0 31.0 - 36.0 %   RBC (Red Blood Cell Count) 4.77 3.77 - 5.16 x10^12/L   RDW-CV (Red Cell Distribution Width) 13.0 11.5 - 14.5 %   NRBC (Nucleated Red Blood Cell Count) 0.00 0 x10^9/L   NRBC % (Nucleated Red Blood Cell %) 0.0 %   MPV (Mean Platelet Volume) 10.2 7.2 - 11.7 fL   Neutrophil Count 3.1 2.0 - 8.6 x10^9/L   Neutrophil % 42.5 37 - 80 %   Lymphocyte Count 3.2 0.6 -  4.2 x10^9/L   Lymphocyte % 43.4 10 - 50 %   Monocyte Count 0.9 0 - 0.9 x10^9/L   Monocyte % 12.4 (H) 0 - 12 %   Eosinophil Count 0.07 0 - 0.70 x10^9/L   Eosinophil % 1.0 0 - 7 %   Basophil Count 0.03 0 - 0.20 x10^9/L   Basophil % 0.4 0 - 2 %   Immature Granulocyte Count 0.02 <=0.06 x10^9/L   Immature Granulocyte % 0.3 <=0.7 %  Comprehensive Metabolic Panel (CMP)   Collection Time: 09/17/23  4:44 PM  Result Value Ref Range   Sodium 140 135 - 145 mmol/L   Potassium 4.2 3.5 - 5.0 mmol/L   Chloride 114 (H) 98 - 108 mmol/L   Carbon Dioxide (CO2) 17 (L) 21 - 30 mmol/L   Urea  Nitrogen (BUN) 56 (H) 7 - 20 mg/dL   Creatinine 3.8 (H) 0.4 - 1.0 mg/dL   Glucose 895 70 - 859 mg/dL   Calcium  10.0 8.7 - 10.2 mg/dL   AST (Aspartate Aminotransferase) 23 15 - 41 U/L   ALT (Alanine Aminotransferase) 26 10 - 39 U/L   Bilirubin, Total 0.8 0.4 - 1.5 mg/dL   Alk Phos (Alkaline Phosphatase) 74 24 - 110 U/L   Albumin  3.6 3.5 - 4.8 g/dL   Protein, Total 7.1 6.2 - 8.1 g/dL   Anion Gap 9 3 - 12 mmol/L   BUN/CREA Ratio 15 6 - 27   Glomerular Filtration Rate (eGFR)  14 mL/min/1.73sq m  Magnesium   Collection Time: 09/17/23  4:44 PM  Result Value Ref Range   Magnesium 2.2 1.8 - 2.5 mg/dL  Tacrolimus  (FK506)   Collection Time: 09/17/23  4:44 PM  Result Value Ref Range   FK506 (Tacrolimus ) 6.1 5.0 - 20.0 ng/mL  Shock Panel, Venous   Collection Time: 09/17/23  4:44 PM  Result Value Ref Range   Patient Temperature, Venous 37.0 C   FIO2, Venous     pH, Venous 7.34 7.32 - 7.42   PCO2, Venous 36 (L) 39 - 55 mmHg   PO2, Venous 48 30 - 55 mmHg   Base Excess, Venous -6 (L) -3 - 3 mmol/L   Bicarbonate, Venous 19 (L) 20 - 28 mmol/L   CO2 TOTAL, VENOUS 21 21 - 30 mmol/L   Hemoglobin, Venous 13.7 11.7 - 15.5 g/dL   Hematocrit, Venous 58.9 35.0 - 45.0 %   %  O2 Hemoglobin, Venous 80.6 60.0 - 85.0 %   % CO Hemoglobin, Venous 1.8 <=2.0 %   % Methemoglobin, Venous 0.4 0.4 - 1.5 %   Volume % O2, Venous 15.5 7.0 -  18.0 %   Sodium, Venous 138 135 - 145 mmol/L   Potassium, Venous 4.1 3.2 - 4.8 mmol/L   Glucose, Nonfasting, Venous 105 70 - 140 mg/dL   Calcium , Ionized 1.29 1.15 - 1.32 mmol/L   Lactate, Venous 1.7 0.6 - 2.2 mmol/L  Beta-Hydroxybutyrate   Collection Time: 09/17/23  4:44 PM  Result Value Ref Range   Beta-Hydroxybutyrate <0.18 <0.40 mmol/L  Culture, Urine, Routine with Pyuria Screen (reflexed from Urinalysis)   Collection Time: 09/17/23  6:12 PM  Result Value Ref Range   See Comment     Color Light Yellow Colorless, Straw, Light Yellow, Yellow, Dark Yellow   Clarity Clear Clear   Specific Gravity 1.017 1.005 - 1.030   pH, Urine 6.0 5.0 - 8.0   Protein, Urinalysis 2+ (!) Negative   Glucose, Urinalysis 4+ (!) Negative   Ketones, Urinalysis Negative Negative   Blood, Urinalysis 3+ (!) Negative   Nitrite, Urinalysis Negative Negative   Leukocytes, Urinalysis Negative Negative   Bilirubin, Urinalysis Negative Negative   Urobilinogen, Urinalysis 0.2 0.2 - 1.0 mg/dL   Red Blood Cells, Urinalysis 85 (H) <=3 /hpf   WBC, UA 5 <=5 /hpf   Squamous Epithelial Cells, Urinalysis 0 /hpf   Hyaline Casts 0 /lpf  POC GLUCOSE   Collection Time: 09/17/23  7:35 PM  Result Value Ref Range   POC Glucose 79 70 - 140 mg/dL  Electrolytes, Random Urine   Collection Time: 09/18/23  3:07 AM  Result Value Ref Range   Sodium, Random Urine 90 mmol/L   Potassium, Random Urine 26 mmol/L   Chloride, Random Urine 78 mmol/L  Creatinine, Random Urine   Collection Time: 09/18/23  3:07 AM  Result Value Ref Range   Creatinine, Random Urine 114 mg/dL  Osmolality Random Urine   Collection Time: 09/18/23  3:07 AM  Result Value Ref Range   Osmolality, Random Urine 499 250 - 1,200 mOsm/kg     Radiology Studies on Admission: US  kidney biopsy right Result Date: 09/17/2023 Procedure: Ultrasound guided core needle biopsy Pre-procedure diagnosis: Transplant kidney. Post-procedure diagnosis: Same. Comparison(s): US   renal transplant 09/10/2023 Attending physician: Dr. Jennifer Bring Resident or Fellow Physician: Dr. Richarda Silverglate Complications: None. EBL: Minimal. Consent: The risks, benefits, and alternatives to the procedure were explained to the patient including, but not limited to, bleeding, infection, injury to adjacent organs and structures, and pain, and the patient gives informed written consent for the procedure. Time-out: Prior to the start of the procedure, a timeout was performed in which the patient's identity, site of procedure, and type of procedure was verified. Technique and Findings: The patient was placed in a supine position and a limited ultrasound of the transplant kidney was performed. The overlying skin was prepped and draped in sterile fashion.  Local anesthesia was obtained with 2% lidocaine . Description of target: Transplant kidney. This was previously described as transplant kidney on the prior diagnostic ultrasound examination. Number of passes with a 18 gauge core needle biopsy system via a 17 gauge introducer needle using coaxial technique: 2 Immediate assessment by cytopathology: Adequate Gelfoam was administered for additional hemostasis. Impression: Technically successful ultrasound guided core needle biopsy of transplant kidney. Dr. Jennifer Bring was present for the entire procedure. Electronically Reviewed by:  Richarda Pinal, MD, [facility]  Electronically Reviewed on:  09/17/2023 4:40 PM I have reviewed the images and concur with the above findings. Electronically Signed by:  Jennifer Bring, MD, [facility] Electronically Signed on:  09/17/2023 5:04 PM   Assessment & Plan  Morgan [name] is a 53 y.o. female admitted for the following problems: Active Problems:   * No active hospital problems. 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 Baseline sCr 1.5 with ongoing elevations in sCr  up to 4.0 at time of admission. Workup ongoing with bx by IR 7/22. US  renal performed in ED showing patent major renal vasculature with appropriate duction of flow, no present aneurysm or peritransplant fluid collection identified.  No hydronephrosis or other findings on 7/15 renal ultrasound.  Differential for acute kidney injury/uptrending creatinine includes prerenal in the setting of poor p.o. intake(the patient reports significant fluid intake as well as polyuria and polydipsia) versus hypotension at home; infrarenal causes, including graft rejection, considered with biopsy in progress.  In setting of ongoing hyperglycemia, query if uncontrolled diabetes may be contributing to renal dysfunction.  Postvoid residual obtaine following admission of 0 voids concern for obstruction.  - Consider engaging with transplant nephrology in the morning Dx: - fena labs - bladder scan - 0 - US  renal ordered - no acute abnormalities  Tx: - s/p 1L LR  # hyperglycemia # type 2 diabetes mellitus. Resolved upon presentation to the ED, following taking home meds which were held prior to procedure. Last A1c 10.6% (09/17/23) from 8.2 in 03/2022. Will resume dose-reduced (50%) home regimen during admission with plan to up-titrate as indicated. - glargine 15 units (home dose Tresiba  U-200 30 units QD) - lispro 10 units TID CC (home dose aspart 20 units TIDCC) - POC BG ACHS - hold home dapagliflozin  # HTN # hx PAD # hx CVA Patient reports taking DAPT at home but holding in the last week prior to procedure.  However, per documentation from cardiology note, was previously on Plavix , then Brilinta , which have since been discontinued.  Will need to clarify medication reconciliation prior to discharge. - continue home Crestor  20 mg qhs - holding home amlodipine  5 mg qd - consider restarting home ASA 81 mg   # DVT ppx - SCDs in setting of recent bx - consider sq heparin  in setting of renal dysfunction  Comorbid  Conditions:     VTE Prophylaxis (Last Assessment on 7/22 at  8:01 PM)  + Anti-Embolism Compression Device Ordered  X Anticoagulant Not Ordered due to Bleeding Risk    Code Status: Full Code  Patient Class & Status: Inpatient, Intermediate  Discharge Planning: Pending clinical course    VALE COUNTESS, MD  [facility] 09/18/2023 6:29 AM    ------------------------------------------------------------------------------- Attestation signed by Edwardo Gilles Morrison, MD at 09/18/2023  3:56 PM (Updated)  Attestation Statement:   I personally saw and evaluated the patient, and participated in the management and treatment plan as documented in the resident/fellow note.  Patient has had loss of appetite since switching from ozempic to mounjaro. States she has been drinking water . Creat improved slightly overnight with IV fluids. Transplant nephrology consulted. Endocrine consulted for a/w diabetes management. F/u cardiology outpatient, esp given h/o abnormal stress test  Hold DVT ppx until ok to resume per nephrology after recent kidney biopsy  FEEROZEH DAWN JAHANSHAHI, MD  -------------------------------------------------------------------------------

## 2023-09-20 NOTE — Progress Notes (Signed)
 Transplant Nephrology Consult Progress Note  Interval History   - Morgan [name] - Evaluated at bedside. Discussed biopsy results, patient in understanding and amenable to stay for treatment. - Cr stable at 3.8 from 3.8/3.5 - UOP recorded in past 24 hrs - FK506 level pending   Medications   . insulin  GLARGINE-yfgn (SEMGLEE ) injection  15 Units Subcutaneous Daily  . insulin  LISPRO (AdmeLOG , HumaLOG ) injection  10 Units Subcutaneous Daily with breakfast  . insulin  LISPRO (AdmeLOG , HumaLOG ) injection  10 Units Subcutaneous Daily with lunch  . insulin  LISPRO (AdmeLOG , HumaLOG ) injection  10 Units Subcutaneous Daily with dinner  . insulin  LISPRO (AdmeLOG , HumaLOG ) injection  0-12 Units Subcutaneous TID CC  . mycophenolate   500 mg Oral Daily   And  . mycophenolate   250 mg Oral QHS  . predniSONE   5 mg Oral Daily  . rosuvastatin   20 mg Oral QHS  . sennosides-docusate  1 tablet Oral BID  . sodium bicarbonate   1,300 mg Oral BID  . tacrolimus   3 mg Oral QHS  . tacrolimus   4 mg Oral Daily    Objective     Current Vital Signs 24h Vital Sign Ranges  T 36.4 C (97.5 F) Temp  Avg: 36.6 C (97.9 F)  Min: 36.4 C (97.5 F)  Max: 36.8 C (98.3 F)  BP (!) 142/73 BP  Min: 94/50  Max: 142/73  HR 94 Pulse  Avg: 95  Min: 93  Max: 100  RR 15 Resp  Avg: 15.6  Min: 15  Max: 16  SaO2 98 %   SpO2  Avg: 99.4 %  Min: 98 %  Max: 100 %          Ins & Outs I/O last 2 completed shifts: In: 100 [P.O.:100] Out: 550 [Urine:550] Current Shift:  No intake/output data recorded.   Physical Exam Gen:                NAD CV:                  RRR, systolic murmur LLSB, no JVP Pulm:               normal WOB, CTA vesicular breath sounds B/L Abd:                 soft, NTND. Bx site with dressing CDI MSK:               no pedal edema  Access:           AVF RUE, AVF LUE. Not in use.  Dialysis Catheters/Urinary Catheters   AV Fistula/AV Graft (AVF/AVG) 02/13/16 AV Fistula (AVF) Right Upper Arm (Active)   Placement Date/Time: 02/13/16 1600   LDA present on assessment/arrival: Yes  Access Type: AV Fistula (AVF)  Orientation: Right  Access Location: Upper Arm  Number of days: 2775     AV Fistula/AV Graft (AVF/AVG) 05/22/10 AV Fistula (AVF) Left Upper Arm (Active)  Placement Date/Time: 05/22/10 0800   LDA present on assessment/arrival: Yes  Access Type: AV Fistula (AVF)  Orientation: Left  Access Location: Upper Arm  Number of days: 4868     [REMOVED] AV Fistula/AV Graft (AVF/AVG) 04/27/16 AV Fistula (AVF) Left Upper Arm (Removed)  Removal Date/Time: (c) 09/18/16 (c) 9192  Placement Date/Time: 04/27/16 2110   Access Type: AV Fistula (AVF)  Orientation: Left  Access Location: Upper Arm  Number of days: 144     [REMOVED] AV Fistula/AV Graft (AVF/AVG) 04/27/16 AV Fistula (AVF) Right  Upper Arm (Removed)  Removal Date/Time: (c) 09/18/16 (c) 9192  Placement Date/Time: 04/27/16 2110   Access Type: AV Fistula (AVF)  Orientation: Right  Access Location: Upper Arm  Number of days: 144       Invasive Urinary Catheters   [REMOVED] Urethral Catheter Non-latex (Removed)  Removal Date/Time: 05/01/16 0250  Placement Placement Date/POA/Time: 04/28/16 1112   Placed at: OR  Insertion bundle used?: Yes  Catheter Type: Non-latex  Tube Size (Fr.): (c) 18 french  Catheter Balloon Size: 10 mL  Urine Return Obtained: Yes  Remova...  Number of days: 3     [REMOVED] Urethral Catheter (Removed)  Removal Date/Time: 05/01/16 1542  Placement Placement Date/POA/Time: 05/01/16 0250   Removal Reason : Removed by provider  Number of days: 0    Labs   Recent Labs  Lab 09/17/23 0824 09/17/23 1644 09/18/23 0640  WBC 5.4 7.4 5.4  HGB 13.0 13.1 12.4  PLT 300 328 304   Recent Labs  Lab 09/17/23 0824 09/17/23 1644 09/18/23 0640 09/19/23 0708 09/20/23 0221  NA 136 140  138 138 139 140  K 4.7 4.2 4.3 3.9 4.1  CL 106 114* 110* 110* 110*  CO2 20* 17* 19* 18* 18*  BUN 57* 56* 49* 52* 55*  CREATININE 4.0* 3.8*  3.5* 3.8* 3.8*  CALCIUM  10.1 10.0 9.6 9.5 9.5  ALKPHOS 75 74  --   --   --   GLUCOSE 391* 104 181* 152* 130   Lab Results  Component Value Date   PTH 368 (H) 09/17/2023   CALCIUM  9.5 09/20/2023   PHOS 3.5 09/20/2023   No results found for: IRON, TIBC, FERRITIN  Transplant History:  Date of Transplant = 04/28/2016 ABO (Donor/Recipient) = compatible Blood type: O/O DonorType: DBD KDPI = 85% Donor anatomy: Left kidney with single artery, vein, and ureter Donor Kidney Bx: 45 glomeruli, no sclerosis or other changes of concerns Allograft injury/complications: None Nephroureteral stent: [id number] stent placed intraoperatively  Pump #s:Flow = 103, Pressure = 35 and RI = 0.27 Pump duration = 10 hours and 9 minutes Cold ischemia time: 15 hours and 5 minutes Warm ischemia time: 40 minutes  Induction agent Thymoglobulin PRA pre-transplant = Class I 10 % and Class II 0%   DIAGNOSIS  A. 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  Microvascular inflammation, extensive. See comment. 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 See comment. Focal global glomerulosclerosis (~30%). Interstitial fibrosis/tubular atrophy, severe.    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  Correlation with laboratory testing for donor-specific antibodies (in progress) is recommended.  The diabetic nephropathy is classified according to the Renal Pathology Society system (Tervaert et al., J Am Soc Nephrol, [phone]).  Preliminary findings were discussed with Dr. Goni Katz-Greenberg at 15:10 on 09/19/23.     Urine microscopy: bland, nondysmorphic RBCs with interspersed squamous epithelial cells   Assessment and  Recommendations  Morgan [name] is a 53 y.o. 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 Nephrology  is consulted for chronic kidney injury and immunosuppression.   Kidney function has been worsening in the past three months, creatinine elevated (~4 mg/dL, with baseline of ~8.4 one year ago) and proteinuria (UPCR~ 1 g/g).  Persistently elevated hemoglobin A1c (currently 10.6).   Biopsy showing chronic AMR, alloantibody testing pending. 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   Allograft function - Chronic AMR Nonoliguric chronic kidney injury on CKD3b - stable DDKT 04/28/2016 - CMV (+/+), EBV (+/+) Chronic metabolic acidosis Baseline creatinine: 1.5-1.6 ; admission creatinine 4.0. Current Creatinine 3.8. Last imaging renal U/S 09/10/2023, no hydronephrosis, patent renal vessels with normal velocities and normal RI. Urine microscopy was bland as expected, nondysmorphic RBCs with interspersed squamous epithelial cells. Renal biopsy indicating chronic AMR.  IV Solumedrol 500 mg x3 days + IVIG 200 mg/kg Q2 weeks for a total of three dosages, will receive first one while admitted HLA Flow PRA Screen pending Strict Is/Os Renal Function Panel daily BK virus not detected Continue home sodium bicarbonate  1300 mg BID Renally dose all meds Appreciate endocrinology's help with insulin  regiment adjustment   Immunosuppression  Component Ref Range & Units (hover)  07:08 (09/19/23) 2 d ago (09/17/23) 1 yr ago (04/17/22) 1 yr ago (10/17/21) 2 yr ago (01/31/21)     FK506 (Tacrolimus ) 10.4 6.1 <redacted file path> 7.8 <redacted file path> 5.6 <redacted file path>    Induction: Thymoglobulin CNI: Decrease tacrolimus  to 4 mg am and 3 mg pm effective evening of 7/24. Level pending FK Goal: 4-6 Next FK level: Please obtain tomorrow Saturday 7/26 one hour before am dose. MPA: MMF 500 mg am, 250 mg pm. 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 Avoid NSAIDs, morphine , and baclofen. Avoid magnesium-containing laxatives (milk of magnesia, magnesium hydroxide) and enemas (SMOG). Avoid phosphate-containing laxative (sodium phosphate) enemas (Fleet phospho-soda). Use caution when administering iodinated contrast with eGFR < 30   Thank you for this consult. We will continue to follow the patient with you.    Please feel free to call with questions or concerns.     FRANCYESS  CREED SHAVE, Med Student Transplant service pager [phone]   The Nephrology Consult team is available in-house from 7A-5P. If urgent assistance is needed outside of these hours, please page the Nephrology on-call pager [phone] for assistance. The Nephrology consult pagers are available after business hours for emergencies only.    Attestation Statement:   The resident was present with the student and patient during the History and Physical Exam. 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 Noting creatinine trend, time from  txp, and findings - plan for 3-5 days of pulse steroids at 500mg  (will see response), and IVIG. Will also need Bactrim for 3 months (ATTSTFR).  Lucious Micky Cumber, MD

## 2023-09-25 NOTE — Discharge Summary (Signed)
 [facility] Adult Abdominal Transplant  Discharge Summary    Admit: 09/17/2023  Discharge Date: 09/25/2023  Admitting Physician: Mylinda Queen, MD  Discharging Physician:  Dr. Henry  Admission Diagnosis Discharge Diagnosis  AKI (acute kidney injury) () [N17.9]  Principal Problem:   Kidney transplant rejection (HHS-HCC) Active Problems:   S/P kidney transplant (HHS-HCC)   Immunosuppression , unspecified (CMS-HCC)   Essential (primary) hypertension   Steroid-induced hyperglycemia   Resolved Problems:   * No resolved hospital problems. *      Brief History and Hospital Course   Morgan [name] is a 53 y.o. 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 On arrival to ED, she was noted to be hypotensive 85/30 with re-check 135/41 without intervention and glucose of 94.   Per chart review and patient/husband interview, Morgan [name] was recently noted to have an abnormal stress test 04/2023 with anterior lateral perfusion abnormality c/f ischemia but had not undergone a subsequent catheterization as it had not been scheduled by her cardiologist whom she had recently just followed up with on 09/11/23. Given her Creatinine at the visit was noted to be worsening to ~3.22 from baseline ~1.5, the catheterization was further deferred until she followed up with her nephrologist and today her Creatinine was noted to be worse at 4.0 prompting outpatient biopsy. Of note, she did have a renal US  completed on 7/15 that demonstrated patent vasculature without evidence of hydronephrosis. She states she has been compliant with her immunosuppression and has not missed any  doses. She has not noticed any decrease in urination, odor, burning with urination, or pain over her transplant site. Overall health wise, she reports some lightheadedness with activity but denies SOB, chest pain. She endorses constipation since switching to Mounjaro.   Hospital Course: 1) Renal biopsy (7/22) with borderline t-cell mediated rejection + microvascular inflammation, severe interstitial fibrosis, and diabetic changes. HLA (7/14) with new DSA. Overall kidney with chronic changes. Received Pulse Steroids x5 doses (7/25 - 7/29) followed by prednisone  5mg  QD 2) Endocrinology following for steroid induced hyperglycemia  Morgan [name] was deemed medically appropriate for discharge on 09/25/2023. Follow up with transplant nephrology on 10/22/23 and first set of labs at local [facility] in 1 week. Has follow up set up with home dialysis center - [facility] in [place] to set up dialysis in future given poor kidney function - scheduled on 10/17/23. Endocrinology follow up 10/10/23.   Assessment and Plan   #Kidney transplant c/b chronic changes on biopsy  - Allograft function: rising sCr despite steroid pulse - HLA (7/24) with new DSA = DR53 (MFI 3403), DQA1*03:01 (MFI 7100), DQ7 (MFI 6131), DQ8 (MF 7872) - Renal biopsy (7/22) with borderline t-cell mediated rejection + microvascular inflammation, severe interstitial fibrosis, and diabetic changes. - S/p IVIG 50g 7/26 (would benefit from pre-meds in the future given bouts of nausea with IVIG) - Received pulse steroids started (7/25 - 7/29) followed by prednisone  5mg  QD (7/30 - ) - Has outpatient dialysis center/home nephrologist appointment set up for 10/17/23 to discuss when to resume dialysis  #Dialysis Access - LUE: graft essentially non functional in the absence of thrill or bruit - RUE: functional graft with thrill +  bruit   #Immunosuppression  - Tacrolimus  BID; goal 8-10 - Mycophenolate  1000mg  BID - Steroids: Pulse  steroids (7/25-29) with plan for prednisone  5mg  QD following (7/30 - )   #Prophylaxis - no need for PCP ppx given short course of pulse steroids without taper required   #T2DM #At risk for steroid-induced hyperglycemia - Endocrinology consulted for assistance with blood glucose management in setting of steroid taper - Monitor blood glucose ACHS - Insulin  regimen: Tresiba  15u QHS + Novolog  8/8/8 + CCI - Resumed home Mounjaro and Farxiga - follow up with endocrinology on 8/14/125   #HTN - continue home Norvasc  5mg  daily   #Hx PAD #Hx CVA - Continue home Crestor  20 mg qhs - Continue home ASA 81and Plavix    Vital Signs/Physical Examination    Vital signs in last 24 hours: Temp:  [36.5 C (97.7 F)-37 C (98.6 F)] 36.7 C (98.1 F) Heart Rate:  [98] 98 Resp:  [16-18] 16 BP: (98-139)/(51-86) 117/86 Temp (24hrs), Avg:36.7 C (98.1 F), Min:36.5 C (97.7 F), Max:37 C (98.6 F)  SpO2: 100 % Weight: 88.4 kg (194 lb 14.2 oz)  Intake/Output last 3 shifts: I/O last 3 completed shifts: In: 1570 [P.O.:1520; IV Piggyback:50] Out: 1550 [Urine:1550]  Physical Exam: General: NAD and alert and oriented, sitting up in bed HEENT: EOMI and moist mucous membranes Cardiovascular: regular rate and rhythm Respiratory: good air exchange, breathing unlabored on RA Abdomen: soft, nontender, nondistended Extremities: no lower extremity edema   Laboratory Studies   . Recent Labs  Lab 09/25/23 0604  WBC 7.4  HGB 12.6  HCT 38.3  PLT 339    Recent Labs  Lab 09/25/23 0604  NA 135  K 4.1  CL 106  CO2 19*  BUN 95*  CREATININE 4.3*  CALCIUM  9.4  GLUCOSE 208*    Recent Labs  Lab 09/22/23 0940 09/23/23 0622 09/24/23 0820  FK506 8.1 7.4 6.5    No results found for: CYA  Discharge Medications   Prescription Monitoring Program reviewed: Yes    Medication List     CHANGE how you take these medications    insulin  ASPART pen injector (concentration 100  units/mL) Commonly known as: NovoLOG  FLEXPEN Inject 8 Units subcutaneously 3 (three) times daily with meals What changed: how much to take   insulin  DEGLUDEC pen injector (concentration 200 units/mL) Commonly known as: TRESIBA  FLEXTOUCH U-200 Inject 14 Units subcutaneously once daily What changed: how much to take   mycophenolate  250 mg capsule Commonly known as: CELLCEPT  Take 4 capsules (1,000 mg total) by mouth every 12 (twelve) hours What changed: See the new instructions.   rosuvastatin  20 MG tablet Commonly known as: CRESTOR  Take 0.5 tablet (10 mg total) by mouth once daily What changed: how much to take   tacrolimus  1 MG capsule Commonly known as: PROGRAF  Take 4 capsules (4 mg total) by mouth once daily AND 3 capsules (3 mg total) at bedtime. What changed: See the new instructions.       CONTINUE taking these medications    amLODIPine  5 MG tablet Commonly known as: NORVASC    aspirin  81 MG EC tablet Take 1 tablet (81 mg total) by mouth once daily.   clopidogreL  75 mg tablet Commonly known as: PLAVIX    DEXCOM G7 SENSOR Devi Use 1 each every 10 (ten) days   diphenhydrAMINE  25 mg capsule Commonly known as: BENADRYL    FARXIGA 10 mg tablet Generic drug: dapagliflozin propanediol   lancing device with lancets kit Three times daily   loratadine  10 mg capsule Commonly known as: CLARITIN   MOUNJARO 7.5 mg/0.5 mL pen injector Generic drug: tirzepatide   multivitamin tablet   * pen needle, diabetic 31 gauge x 1/4 needle Use as directed Pt may use 4times daily plus if needed for coverage with sliding scale.   * pen needle, diabetic 31 gauge x 5/16 needle Commonly known as: BD ULTRA-FINE SHORT PEN NEEDLE 4 (four) times daily   predniSONE  5 MG tablet Commonly known as: DELTASONE  Take 1 tablet (5 mg total) by mouth once daily   sennosides-docusate 8.6-50 mg tablet Commonly known as: SENOKOT-S Take 2 tablets by mouth 2 (two) times daily   sodium  bicarbonate 650 MG tablet Take 2 tablets (1,300 mg total) by mouth 2 (two) times daily (2 in the morning and 2 at night)      * * This list has 2 medication(s) that are the same as other medications prescribed for you. Read the directions carefully, and ask your doctor or other care provider to review them with you.          STOP taking these medications    Herbal Supplement   ketoconazole 2 % cream Commonly known as: NIZORAL   PARoxetine  10 MG tablet Commonly known as: PAXIL          Where to Get Your Medications     These medications were sent to [facility]  [address] [place], [place] [address]    Hours: 8:30-6 MON [place] Phone: [phone]  insulin  ASPART pen injector (concentration 100 units/mL) insulin  DEGLUDEC pen injector (concentration 200 units/mL) mycophenolate  250 mg capsule predniSONE  5 MG tablet rosuvastatin  20 MG tablet sennosides-docusate 8.6-50 mg tablet tacrolimus  1 MG capsule      Discharge Planning   Code Status: Full Code   Discharge Disposition: Home  Discharged on dialysis: has follow up scheduled with her home nephrologist and dialysis center ([facility]) in [place] to discuss if/when to set up dialysis (potentially in near future) - appointment scheduled for 10/17/23.  Recommended Labs: Transplant labs: CBC with diff, CMP, FK506, Magnesium, CMV PCR, EBV PCR  Follow-up Care Recommendations/Patient Instructions: Per AVS instructions  Results Pending at Discharge:  None  Future Follow-up Appointments: Follow up with transplant nephrology on 10/22/23 and first set of labs at local [facility] in 1 week. Has follow up set up with home dialysis center - [facility] in [place] to set up dialysis in future given poor kidney function - scheduled on 10/17/23. Endocrinology follow up 10/10/23.  Future Appointments  Date Time Provider Department Center  10/10/2023  2:40 PM Carron Lamarr Sar, NP  [facility] [place]  10/22/2023 11:00 AM RENAL TRANSPLANT PROVIDER 1 [facility]  06/04/2024 12:30 PM Caye Leita Gunner, MD [facility]      Time Spent:  60 minutes   ISAIAH HERTER [place] [name], Morgan [name] 09/25/2023 Adult Abdominal Transplant Surgery   ------------------------------------------------------------------------------- Attestation signed by Henry Norleen Eck, MD at 09/25/2023  1:52 PM Attestation Statement:   I personally saw the patient and performed a substantive portion of the medical decision making, in conjunction with the Advanced Practice Provider for the condition/treatment of acute on chronic rejection, advanced CKD stage V  She is doing well today and feels good.  Good appetite no nausea vomiting eating well walking well.  She is interested in going home Fortunately her serum creatinine has fallen indicating response to IV steroids and improved condition.  Overall I think  she is able to be discharged since this is improving.  I will increase her oral medications except for prednisone  which she feels strongly about.  She really wants to avoid high dose prednisone  to avoid the weight gain and hyperglycemia and I told her that most patients with any sort of rejection we usually discharge with a higher dose of prednisone  and a long taper but she would strongly prefer to be on a lower dose prednisone  of 5 mg a day.  I increased her CellCept  to full dose of 1000 mg twice a day.  I counseled her about the risk of diarrhea and if this were to occur please contact us  and we will guide her on reducing the dose.  Her Tacrolimus  dose has been increased slightly to 3 mg and 4 mg as I want to get the trough closer to 8.  Also she does not need PJP prophylaxis because she is not going out on high-dose steroids, just 5 mg of prednisone  a day.  Discussed with pharmacy who recommended this plan and agreed.   FK506 (Tacrolimus )  Date Value Ref Range Status  09/24/2023  6.5 5.0 - 20.0 ng/mL Final   I did talk to her about prognosis, there is a lot of chronic rejection on her kidney transplant biopsy I am not sure she will regain much in the way of kidney function may be at best GFR could end up in the 15 to 20 mL/min range.  Either way I strongly recommended close establishment and follow-up with her local nephrology team that way if and when she needs dialysis they could implement that locally.  She does have a functioning right arm AV graft  In addition her follow-up is can be very important I want her to see us  in the transplant clinic within a few weeks and also her local nephrologist in August.  See below for that plan  She also needs labs next week mainly to check kidney function which I suspect will be better and also Tacrolimus  trough  Future Follow-up Appointments: Follow up with transplant nephrology on 10/22/23 and first set of labs at local [facility] in 1 week. Has follow up set up with home dialysis center - [facility] in [place] to set up dialysis in future given poor kidney function - scheduled on 10/17/23. Endocrinology follow up 10/10/23.   Norleen Francis Rummer, MD  ------------------------------------------------------------------------------- *Some images could not be shown.

## 2023-10-08 ENCOUNTER — Emergency Department (HOSPITAL_COMMUNITY)

## 2023-10-08 ENCOUNTER — Inpatient Hospital Stay (HOSPITAL_COMMUNITY)
Admission: EM | Admit: 2023-10-08 | Discharge: 2023-10-10 | DRG: 189 | Discharge status: Against Medical Advice | Attending: Family Medicine | Admitting: Family Medicine

## 2023-10-08 DIAGNOSIS — Z7902 Long term (current) use of antithrombotics/antiplatelets: Secondary | ICD-10-CM

## 2023-10-08 DIAGNOSIS — R000 Tachycardia, unspecified: Secondary | ICD-10-CM | POA: Diagnosis present

## 2023-10-08 DIAGNOSIS — E11319 Type 2 diabetes mellitus with unspecified diabetic retinopathy without macular edema: Secondary | ICD-10-CM | POA: Diagnosis present

## 2023-10-08 DIAGNOSIS — R7989 Other specified abnormal findings of blood chemistry: Secondary | ICD-10-CM | POA: Diagnosis present

## 2023-10-08 DIAGNOSIS — E785 Hyperlipidemia, unspecified: Secondary | ICD-10-CM | POA: Diagnosis present

## 2023-10-08 DIAGNOSIS — D649 Anemia, unspecified: Secondary | ICD-10-CM | POA: Diagnosis present

## 2023-10-08 DIAGNOSIS — Z8673 Personal history of transient ischemic attack (TIA), and cerebral infarction without residual deficits: Secondary | ICD-10-CM

## 2023-10-08 DIAGNOSIS — Z91040 Latex allergy status: Secondary | ICD-10-CM

## 2023-10-08 DIAGNOSIS — Z7982 Long term (current) use of aspirin: Secondary | ICD-10-CM

## 2023-10-08 DIAGNOSIS — N186 End stage renal disease: Secondary | ICD-10-CM

## 2023-10-08 DIAGNOSIS — Z91013 Allergy to seafood: Secondary | ICD-10-CM

## 2023-10-08 DIAGNOSIS — Z940 Kidney transplant status: Secondary | ICD-10-CM

## 2023-10-08 DIAGNOSIS — D509 Iron deficiency anemia, unspecified: Secondary | ICD-10-CM | POA: Diagnosis present

## 2023-10-08 DIAGNOSIS — Z79624 Long term (current) use of inhibitors of nucleotide synthesis: Secondary | ICD-10-CM

## 2023-10-08 DIAGNOSIS — J90 Pleural effusion, not elsewhere classified: Secondary | ICD-10-CM | POA: Diagnosis present

## 2023-10-08 DIAGNOSIS — E1151 Type 2 diabetes mellitus with diabetic peripheral angiopathy without gangrene: Secondary | ICD-10-CM | POA: Diagnosis present

## 2023-10-08 DIAGNOSIS — Z882 Allergy status to sulfonamides status: Secondary | ICD-10-CM

## 2023-10-08 DIAGNOSIS — Z7985 Long-term (current) use of injectable non-insulin antidiabetic drugs: Secondary | ICD-10-CM

## 2023-10-08 DIAGNOSIS — N1831 Chronic kidney disease, stage 3a: Secondary | ICD-10-CM | POA: Diagnosis present

## 2023-10-08 DIAGNOSIS — Z886 Allergy status to analgesic agent status: Secondary | ICD-10-CM

## 2023-10-08 DIAGNOSIS — Z1152 Encounter for screening for COVID-19: Secondary | ICD-10-CM

## 2023-10-08 DIAGNOSIS — Z91012 Allergy to eggs: Secondary | ICD-10-CM

## 2023-10-08 DIAGNOSIS — T8619 Other complication of kidney transplant: Secondary | ICD-10-CM | POA: Diagnosis present

## 2023-10-08 DIAGNOSIS — Z79899 Other long term (current) drug therapy: Secondary | ICD-10-CM

## 2023-10-08 DIAGNOSIS — Z5329 Procedure and treatment not carried out because of patient's decision for other reasons: Secondary | ICD-10-CM | POA: Diagnosis not present

## 2023-10-08 DIAGNOSIS — I10 Essential (primary) hypertension: Secondary | ICD-10-CM | POA: Diagnosis present

## 2023-10-08 DIAGNOSIS — I2510 Atherosclerotic heart disease of native coronary artery without angina pectoris: Secondary | ICD-10-CM | POA: Diagnosis present

## 2023-10-08 DIAGNOSIS — N179 Acute kidney failure, unspecified: Secondary | ICD-10-CM | POA: Diagnosis present

## 2023-10-08 DIAGNOSIS — E872 Acidosis, unspecified: Secondary | ICD-10-CM | POA: Diagnosis present

## 2023-10-08 DIAGNOSIS — E1122 Type 2 diabetes mellitus with diabetic chronic kidney disease: Secondary | ICD-10-CM | POA: Diagnosis present

## 2023-10-08 DIAGNOSIS — Z794 Long term (current) use of insulin: Secondary | ICD-10-CM

## 2023-10-08 DIAGNOSIS — J9601 Acute respiratory failure with hypoxia: Secondary | ICD-10-CM | POA: Diagnosis not present

## 2023-10-08 DIAGNOSIS — Y830 Surgical operation with transplant of whole organ as the cause of abnormal reaction of the patient, or of later complication, without mention of misadventure at the time of the procedure: Secondary | ICD-10-CM | POA: Diagnosis present

## 2023-10-08 DIAGNOSIS — Z91041 Radiographic dye allergy status: Secondary | ICD-10-CM

## 2023-10-08 DIAGNOSIS — E119 Type 2 diabetes mellitus without complications: Secondary | ICD-10-CM

## 2023-10-08 DIAGNOSIS — Z833 Family history of diabetes mellitus: Secondary | ICD-10-CM

## 2023-10-08 DIAGNOSIS — Z955 Presence of coronary angioplasty implant and graft: Secondary | ICD-10-CM

## 2023-10-08 DIAGNOSIS — D631 Anemia in chronic kidney disease: Secondary | ICD-10-CM | POA: Diagnosis present

## 2023-10-08 DIAGNOSIS — I739 Peripheral vascular disease, unspecified: Secondary | ICD-10-CM | POA: Diagnosis present

## 2023-10-08 DIAGNOSIS — Z992 Dependence on renal dialysis: Secondary | ICD-10-CM

## 2023-10-08 LAB — CBC WITH DIFFERENTIAL/PLATELET
Abs Immature Granulocytes: 0.04 K/uL (ref 0.00–0.07)
Basophils Absolute: 0 K/uL (ref 0.0–0.1)
Basophils Relative: 0 %
Eosinophils Absolute: 0 K/uL (ref 0.0–0.5)
Eosinophils Relative: 0 %
HCT: 32.4 % — ABNORMAL LOW (ref 36.0–46.0)
Hemoglobin: 9.9 g/dL — ABNORMAL LOW (ref 12.0–15.0)
Immature Granulocytes: 0 %
Lymphocytes Relative: 16 %
Lymphs Abs: 1.7 K/uL (ref 0.7–4.0)
MCH: 27.3 pg (ref 26.0–34.0)
MCHC: 30.6 g/dL (ref 30.0–36.0)
MCV: 89.5 fL (ref 80.0–100.0)
Monocytes Absolute: 0.4 K/uL (ref 0.1–1.0)
Monocytes Relative: 4 %
Neutro Abs: 8.4 K/uL — ABNORMAL HIGH (ref 1.7–7.7)
Neutrophils Relative %: 80 %
Platelets: 204 K/uL (ref 150–400)
RBC: 3.62 MIL/uL — ABNORMAL LOW (ref 3.87–5.11)
RDW: 13.2 % (ref 11.5–15.5)
WBC: 10.5 K/uL (ref 4.0–10.5)
nRBC: 0 % (ref 0.0–0.2)

## 2023-10-08 LAB — I-STAT VENOUS BLOOD GAS, ED
Acid-base deficit: 10 mmol/L — ABNORMAL HIGH (ref 0.0–2.0)
Bicarbonate: 15.1 mmol/L — ABNORMAL LOW (ref 20.0–28.0)
Calcium, Ion: 1.09 mmol/L — ABNORMAL LOW (ref 1.15–1.40)
HCT: 30 % — ABNORMAL LOW (ref 36.0–46.0)
Hemoglobin: 10.2 g/dL — ABNORMAL LOW (ref 12.0–15.0)
O2 Saturation: 99 %
Potassium: 3.7 mmol/L (ref 3.5–5.1)
Sodium: 143 mmol/L (ref 135–145)
TCO2: 16 mmol/L — ABNORMAL LOW (ref 22–32)
pCO2, Ven: 29.8 mmHg — ABNORMAL LOW (ref 44–60)
pH, Ven: 7.315 (ref 7.25–7.43)
pO2, Ven: 156 mmHg — ABNORMAL HIGH (ref 32–45)

## 2023-10-08 LAB — I-STAT CHEM 8, ED
BUN: 34 mg/dL — ABNORMAL HIGH (ref 6–20)
Calcium, Ion: 1.09 mmol/L — ABNORMAL LOW (ref 1.15–1.40)
Chloride: 115 mmol/L — ABNORMAL HIGH (ref 98–111)
Creatinine, Ser: 3.5 mg/dL — ABNORMAL HIGH (ref 0.44–1.00)
Glucose, Bld: 345 mg/dL — ABNORMAL HIGH (ref 70–99)
HCT: 29 % — ABNORMAL LOW (ref 36.0–46.0)
Hemoglobin: 9.9 g/dL — ABNORMAL LOW (ref 12.0–15.0)
Potassium: 3.7 mmol/L (ref 3.5–5.1)
Sodium: 143 mmol/L (ref 135–145)
TCO2: 16 mmol/L — ABNORMAL LOW (ref 22–32)

## 2023-10-08 LAB — BRAIN NATRIURETIC PEPTIDE: B Natriuretic Peptide: 497 pg/mL — ABNORMAL HIGH (ref 0.0–100.0)

## 2023-10-08 LAB — I-STAT CG4 LACTIC ACID, ED: Lactic Acid, Venous: 1.6 mmol/L (ref 0.5–1.9)

## 2023-10-08 NOTE — ED Provider Notes (Signed)
 [facility]-EMER[facility] DEPT [facility] Emergency Department Provider Note MRN:  [id number]  Arrival date & time: 10/09/23     Chief Complaint   SOB  History of Present Illness   Morgan [name] is a 53 y.o. year-old female presents to the ED with chief complaint of SOB.  She reports sudden onset SOB and cough that started at 7pm tonight.  Denies fever or chills.  Denies pain.  Was 70% on RA with EMS.  Placed on NRB and escalated to CPAP and then to BiPAP in the ED.    She was recently admitted AKI and concern for transplant rejection on 09/17/23.    History provided by patient.   Review of Systems  Pertinent positive and negative review of systems noted in HPI.    Physical Exam   Vitals:   10/08/23 2245 10/09/23 0047  BP: 111/64 116/68  Pulse: (!) 118 (!) 106  Resp: (!) 33 (!) 21  Temp:    SpO2: 100% 100%    CONSTITUTIONAL:  chronically ill-appearing, NAD NEURO:  Alert and oriented x 3, CN 3-12 grossly intact EYES:  eyes equal and reactive ENT/NECK:  Supple, no stridor  CARDIO:  tachycardic, regular rhythm, appears well-perfused  PULM: Respiratory distress, rales bilaterally, on BiPAP GI/GU:  non-distended,  MSK/SPINE:  No gross deformities, no edema, moves all extremities  SKIN:  no rash, atraumatic   *Additional and/or pertinent findings included in MDM below  Diagnostic and Interventional Summary    EKG Interpretation Date/Time:  Tuesday October 08 2023 22:01:03 EDT Ventricular Rate:  122 PR Interval:  135 QRS Duration:  90 QT Interval:  339 QTC Calculation: 483 R Axis:   0  Text Interpretation: Sinus tachycardia Low voltage, precordial leads Consider anterior infarct Repol abnrm suggests ischemia, lateral leads Confirmed by Ruthe Cornet [phone]) on 10/08/2023 10:42:36 PM       Labs Reviewed  CBC WITH DIFFERENTIAL/PLATELET - Abnormal; Notable for the following components:      Result Value   RBC 3.62 (*)    Hemoglobin 9.9 (*)    HCT 32.4 (*)     Neutro Abs 8.4 (*)    All other components within normal limits  BRAIN NATRIURETIC PEPTIDE - Abnormal; Notable for the following components:   B Natriuretic Peptide 497.0 (*)    All other components within normal limits  I-STAT VENOUS BLOOD GAS, ED - Abnormal; Notable for the following components:   pCO2, Ven 29.8 (*)    pO2, Ven 156 (*)    Bicarbonate 15.1 (*)    TCO2 16 (*)    Acid-base deficit 10.0 (*)    Calcium , Ion 1.09 (*)    HCT 30.0 (*)    Hemoglobin 10.2 (*)    All other components within normal limits  I-STAT CHEM 8, ED - Abnormal; Notable for the following components:   Chloride 115 (*)    BUN 34 (*)    Creatinine, Ser 3.50 (*)    Glucose, Bld 345 (*)    Calcium , Ion 1.09 (*)    TCO2 16 (*)    Hemoglobin 9.9 (*)    HCT 29.0 (*)    All other components within normal limits  RESP PANEL BY RT-PCR (RSV, FLU A&B, COVID)  RVPGX2  CULTURE, BLOOD (ROUTINE X 2)  CULTURE, BLOOD (ROUTINE X 2)  COMPREHENSIVE METABOLIC PANEL WITH GFR  I-STAT CG4 LACTIC ACID, ED  TROPONIN I (HIGH SENSITIVITY)  TROPONIN I (HIGH SENSITIVITY)    DG Chest Portable 1 View  Final Result      Medications - No data to display   Procedures  /  Critical Care .Critical Care  Performed by: Vicky Charleston, PA-C Authorized by: Vicky Charleston, PA-C   Critical care provider statement:    Critical care time (minutes):  55   Critical care was necessary to treat or prevent imminent or life-threatening deterioration of the following conditions:  Respiratory failure   Critical care was time spent personally by me on the following activities:  Development of treatment plan with patient or surrogate, discussions with consultants, evaluation of patient's response to treatment, examination of patient, ordering and review of laboratory studies, ordering and review of radiographic studies, ordering and performing treatments and interventions, pulse oximetry, re-evaluation of patient's condition and review of  old charts   ED Course and Medical Decision Making  I have reviewed the triage vital signs, the nursing notes, and pertinent available records from the EMR.  Social Determinants Affecting Complexity of Care: Patient has no clinically significant social determinants affecting this chief complaint..   ED Course:    Medical Decision Making Patient here with SOB.  Was BIB EMS for SOB that started at 7pm tonight.  Has had some frothy sputum.    Hx of kidney transplant and was recently admitted for AKI and t-cell mediated transplant rejection.  Was put on some pulsed steroids.  States she had been doing well since discharge until tonight.   Had an abnormal stress earlier this year.  Was 70% on RA with EMS.  Recovering on BiPAP, but still SOB and sounds wet.   Creatinine is a little better than recent admission at [facility].    Patient continues to improve on BiPAP.  She appears stable for hospitalist admission.  Amount and/or Complexity of Data Reviewed Labs: ordered. Radiology: ordered.  Risk Decision regarding hospitalization.         Consultants: I consulted with Hospitalist, Dr. Charlton, who is appreciated for admitting.   Treatment and Plan: Patient's exam and diagnostic results are concerning for acute respiratory failure 2/2 suspected CHF exacerbation.  Feel that patient will need admission to the hospital for further treatment and evaluation.    Final Clinical Impressions(s) / ED Diagnoses     ICD-10-CM   1. Acute hypoxic respiratory failure (HCC)  J96.01       ED Discharge Orders     None         Discharge Instructions Discussed with and Provided to Patient:   Discharge Instructions   None      Vicky Charleston, PA-C 10/09/23 0119    Carita Senior, MD 10/09/23 [phone]

## 2023-10-08 NOTE — ED Triage Notes (Addendum)
 Pt bibgcems from home pt had breathing difficult since 7pm. EMS noted rales in all areas.  Hx kidney transplant. .4 nitroglycerin  given with ems Cbg 405 Hr 140 Bp 140/100 70% RA  99% cpap

## 2023-10-09 ENCOUNTER — Encounter (HOSPITAL_COMMUNITY): Payer: Self-pay | Admitting: Family Medicine

## 2023-10-09 ENCOUNTER — Other Ambulatory Visit: Payer: Self-pay

## 2023-10-09 ENCOUNTER — Inpatient Hospital Stay (HOSPITAL_COMMUNITY)

## 2023-10-09 DIAGNOSIS — Z8673 Personal history of transient ischemic attack (TIA), and cerebral infarction without residual deficits: Secondary | ICD-10-CM | POA: Diagnosis not present

## 2023-10-09 DIAGNOSIS — E11319 Type 2 diabetes mellitus with unspecified diabetic retinopathy without macular edema: Secondary | ICD-10-CM | POA: Diagnosis present

## 2023-10-09 DIAGNOSIS — E785 Hyperlipidemia, unspecified: Secondary | ICD-10-CM | POA: Diagnosis present

## 2023-10-09 DIAGNOSIS — D649 Anemia, unspecified: Secondary | ICD-10-CM | POA: Diagnosis present

## 2023-10-09 DIAGNOSIS — Z955 Presence of coronary angioplasty implant and graft: Secondary | ICD-10-CM | POA: Diagnosis not present

## 2023-10-09 DIAGNOSIS — D509 Iron deficiency anemia, unspecified: Secondary | ICD-10-CM | POA: Diagnosis present

## 2023-10-09 DIAGNOSIS — Z1152 Encounter for screening for COVID-19: Secondary | ICD-10-CM | POA: Diagnosis not present

## 2023-10-09 DIAGNOSIS — I2510 Atherosclerotic heart disease of native coronary artery without angina pectoris: Secondary | ICD-10-CM | POA: Diagnosis present

## 2023-10-09 DIAGNOSIS — R7989 Other specified abnormal findings of blood chemistry: Secondary | ICD-10-CM | POA: Diagnosis not present

## 2023-10-09 DIAGNOSIS — N179 Acute kidney failure, unspecified: Secondary | ICD-10-CM | POA: Diagnosis present

## 2023-10-09 DIAGNOSIS — J9601 Acute respiratory failure with hypoxia: Secondary | ICD-10-CM | POA: Diagnosis present

## 2023-10-09 DIAGNOSIS — Z7985 Long-term (current) use of injectable non-insulin antidiabetic drugs: Secondary | ICD-10-CM | POA: Diagnosis not present

## 2023-10-09 DIAGNOSIS — Z79899 Other long term (current) drug therapy: Secondary | ICD-10-CM | POA: Diagnosis not present

## 2023-10-09 DIAGNOSIS — Y830 Surgical operation with transplant of whole organ as the cause of abnormal reaction of the patient, or of later complication, without mention of misadventure at the time of the procedure: Secondary | ICD-10-CM | POA: Diagnosis present

## 2023-10-09 DIAGNOSIS — E872 Acidosis, unspecified: Secondary | ICD-10-CM | POA: Diagnosis present

## 2023-10-09 DIAGNOSIS — Z833 Family history of diabetes mellitus: Secondary | ICD-10-CM | POA: Diagnosis not present

## 2023-10-09 DIAGNOSIS — T8619 Other complication of kidney transplant: Secondary | ICD-10-CM | POA: Diagnosis present

## 2023-10-09 DIAGNOSIS — Z91041 Radiographic dye allergy status: Secondary | ICD-10-CM | POA: Diagnosis not present

## 2023-10-09 DIAGNOSIS — Z794 Long term (current) use of insulin: Secondary | ICD-10-CM | POA: Diagnosis not present

## 2023-10-09 DIAGNOSIS — Z5329 Procedure and treatment not carried out because of patient's decision for other reasons: Secondary | ICD-10-CM | POA: Diagnosis not present

## 2023-10-09 DIAGNOSIS — E1151 Type 2 diabetes mellitus with diabetic peripheral angiopathy without gangrene: Secondary | ICD-10-CM | POA: Diagnosis present

## 2023-10-09 DIAGNOSIS — J90 Pleural effusion, not elsewhere classified: Secondary | ICD-10-CM | POA: Diagnosis present

## 2023-10-09 DIAGNOSIS — E1122 Type 2 diabetes mellitus with diabetic chronic kidney disease: Secondary | ICD-10-CM | POA: Diagnosis present

## 2023-10-09 DIAGNOSIS — D631 Anemia in chronic kidney disease: Secondary | ICD-10-CM | POA: Diagnosis present

## 2023-10-09 DIAGNOSIS — Z886 Allergy status to analgesic agent status: Secondary | ICD-10-CM | POA: Diagnosis not present

## 2023-10-09 DIAGNOSIS — Z91012 Allergy to eggs: Secondary | ICD-10-CM | POA: Diagnosis not present

## 2023-10-09 DIAGNOSIS — N1831 Chronic kidney disease, stage 3a: Secondary | ICD-10-CM | POA: Diagnosis present

## 2023-10-09 HISTORY — DX: Acute respiratory failure with hypoxia: J96.01

## 2023-10-09 LAB — BASIC METABOLIC PANEL WITH GFR
Anion gap: 11 (ref 5–15)
BUN: 41 mg/dL — ABNORMAL HIGH (ref 6–20)
CO2: 17 mmol/L — ABNORMAL LOW (ref 22–32)
Calcium: 9 mg/dL (ref 8.9–10.3)
Chloride: 109 mmol/L (ref 98–111)
Creatinine, Ser: 3.86 mg/dL — ABNORMAL HIGH (ref 0.44–1.00)
GFR, Estimated: 13 mL/min — ABNORMAL LOW (ref >60)
Glucose, Bld: 313 mg/dL — ABNORMAL HIGH (ref 70–99)
Potassium: 4.7 mmol/L (ref 3.5–5.1)
Sodium: 137 mmol/L (ref 135–145)

## 2023-10-09 LAB — GLUCOSE, CAPILLARY
Glucose-Capillary: 156 mg/dL — ABNORMAL HIGH (ref 70–99)
Glucose-Capillary: 168 mg/dL — ABNORMAL HIGH (ref 70–99)

## 2023-10-09 LAB — HIV ANTIBODY (ROUTINE TESTING W REFLEX): HIV Screen 4th Generation wRfx: NONREACTIVE

## 2023-10-09 LAB — CBC
HCT: 30.4 % — ABNORMAL LOW (ref 36.0–46.0)
Hemoglobin: 9.6 g/dL — ABNORMAL LOW (ref 12.0–15.0)
MCH: 27.3 pg (ref 26.0–34.0)
MCHC: 31.6 g/dL (ref 30.0–36.0)
MCV: 86.4 fL (ref 80.0–100.0)
Platelets: 212 K/uL (ref 150–400)
RBC: 3.52 MIL/uL — ABNORMAL LOW (ref 3.87–5.11)
RDW: 13.2 % (ref 11.5–15.5)
WBC: 7.4 K/uL (ref 4.0–10.5)
nRBC: 0 % (ref 0.0–0.2)

## 2023-10-09 LAB — RESP PANEL BY RT-PCR (RSV, FLU A&B, COVID)  RVPGX2
Influenza A by PCR: NEGATIVE
Influenza B by PCR: NEGATIVE
Resp Syncytial Virus by PCR: NEGATIVE
SARS Coronavirus 2 by RT PCR: NEGATIVE

## 2023-10-09 LAB — CBG MONITORING, ED
Glucose-Capillary: 224 mg/dL — ABNORMAL HIGH (ref 70–99)
Glucose-Capillary: 238 mg/dL — ABNORMAL HIGH (ref 70–99)

## 2023-10-09 LAB — TROPONIN I (HIGH SENSITIVITY)
Troponin I (High Sensitivity): 237 ng/L (ref <18)
Troponin I (High Sensitivity): 166 ng/L (ref <18)

## 2023-10-09 LAB — D-DIMER, QUANTITATIVE: D-Dimer, Quant: 2.32 ug{FEU}/mL — ABNORMAL HIGH (ref 0.00–0.50)

## 2023-10-09 MED ORDER — FENTANYL CITRATE PF 50 MCG/ML IJ SOSY: 12.5000 ug | PREFILLED_SYRINGE | INTRAMUSCULAR | Status: DC | PRN

## 2023-10-09 MED ORDER — TACROLIMUS 1 MG PO CAPS
3.0000 mg | ORAL_CAPSULE | Freq: Every day | ORAL | Status: DC
  Administered 2023-10-09 (×2): 3 mg via ORAL
  Filled 2023-10-09 (×2): qty 3

## 2023-10-09 MED ORDER — SENNA 8.6 MG PO TABS: 1.0000 | ORAL_TABLET | Freq: Every day | ORAL | Status: DC | PRN

## 2023-10-09 MED ORDER — PROCHLORPERAZINE EDISYLATE 10 MG/2ML IJ SOLN: 5.0000 mg | Freq: Four times a day (QID) | INTRAMUSCULAR | Status: DC | PRN

## 2023-10-09 MED ORDER — SODIUM BICARBONATE 650 MG PO TABS
1300.0000 mg | ORAL_TABLET | Freq: Two times a day (BID) | ORAL | Status: DC
  Administered 2023-10-09 – 2023-10-10 (×5): 1300 mg via ORAL
  Filled 2023-10-09 (×3): qty 2

## 2023-10-09 MED ORDER — PREDNISONE 5 MG PO TABS
5.0000 mg | ORAL_TABLET | Freq: Every day | ORAL | Status: DC
  Administered 2023-10-09 – 2023-10-10 (×3): 5 mg via ORAL
  Filled 2023-10-09 (×2): qty 1

## 2023-10-09 MED ORDER — ASPIRIN 81 MG PO TBEC
81.0000 mg | DELAYED_RELEASE_TABLET | Freq: Every day | ORAL | Status: DC
Start: 2023-10-09 — End: 2023-10-10
  Administered 2023-10-09 – 2023-10-10 (×3): 81 mg via ORAL
  Filled 2023-10-09 (×2): qty 1

## 2023-10-09 MED ORDER — ACETAMINOPHEN 325 MG PO TABS: 650.0000 mg | ORAL_TABLET | Freq: Four times a day (QID) | ORAL | Status: DC | PRN

## 2023-10-09 MED ORDER — OXYCODONE HCL 5 MG PO TABS: 5.0000 mg | ORAL_TABLET | ORAL | Status: DC | PRN

## 2023-10-09 MED ORDER — AMLODIPINE BESYLATE 5 MG PO TABS
5.0000 mg | ORAL_TABLET | Freq: Every day | ORAL | Status: DC
  Filled 2023-10-09 (×2): qty 1

## 2023-10-09 MED ORDER — HEPARIN SODIUM (PORCINE) 5000 UNIT/ML IJ SOLN
5000.0000 [IU] | Freq: Three times a day (TID) | INTRAMUSCULAR | Status: DC
  Administered 2023-10-09 – 2023-10-10 (×7): 5000 [IU] via SUBCUTANEOUS
  Filled 2023-10-09 (×4): qty 1

## 2023-10-09 MED ORDER — INSULIN GLARGINE-YFGN 100 UNIT/ML ~~LOC~~ SOLN
16.0000 [IU] | Freq: Two times a day (BID) | SUBCUTANEOUS | Status: DC
  Administered 2023-10-09 – 2023-10-10 (×5): 16 [IU] via SUBCUTANEOUS
  Filled 2023-10-09 (×7): qty 0.16

## 2023-10-09 MED ORDER — MYCOPHENOLATE MOFETIL 250 MG PO CAPS
500.0000 mg | ORAL_CAPSULE | Freq: Two times a day (BID) | ORAL | Status: DC
  Administered 2023-10-09 – 2023-10-10 (×5): 500 mg via ORAL
  Filled 2023-10-09 (×3): qty 2

## 2023-10-09 MED ORDER — INSULIN GLARGINE-YFGN 100 UNIT/ML ~~LOC~~ SOLN
10.0000 [IU] | Freq: Every day | SUBCUTANEOUS | Status: DC
  Filled 2023-10-09: qty 0.1

## 2023-10-09 MED ORDER — TACROLIMUS 1 MG PO CAPS: 3.0000 mg | ORAL_CAPSULE | ORAL | Status: DC

## 2023-10-09 MED ORDER — PIPERACILLIN-TAZOBACTAM IN DEX 2-0.25 GM/50ML IV SOLN
2.2500 g | Freq: Three times a day (TID) | INTRAVENOUS | Status: DC
  Administered 2023-10-09 – 2023-10-10 (×6): 2.25 g via INTRAVENOUS
  Filled 2023-10-09 (×5): qty 50

## 2023-10-09 MED ORDER — CLOPIDOGREL BISULFATE 75 MG PO TABS
75.0000 mg | ORAL_TABLET | Freq: Every day | ORAL | Status: DC
  Administered 2023-10-09 – 2023-10-10 (×3): 75 mg via ORAL
  Filled 2023-10-09 (×2): qty 1

## 2023-10-09 MED ORDER — INSULIN ASPART 100 UNIT/ML IJ SOLN
0.0000 [IU] | Freq: Three times a day (TID) | INTRAMUSCULAR | Status: DC
  Administered 2023-10-09: 2 [IU] via SUBCUTANEOUS
  Administered 2023-10-09 (×4): 3 [IU] via SUBCUTANEOUS
  Administered 2023-10-09: 2 [IU] via SUBCUTANEOUS

## 2023-10-09 MED ORDER — TACROLIMUS 1 MG PO CAPS
4.0000 mg | ORAL_CAPSULE | Freq: Every morning | ORAL | Status: DC
  Administered 2023-10-09 – 2023-10-10 (×3): 4 mg via ORAL
  Filled 2023-10-09 (×2): qty 4

## 2023-10-09 MED ORDER — INSULIN ASPART 100 UNIT/ML IJ SOLN: 0.0000 [IU] | Freq: Every day | INTRAMUSCULAR | Status: DC

## 2023-10-09 MED ORDER — ROSUVASTATIN CALCIUM 5 MG PO TABS
10.0000 mg | ORAL_TABLET | Freq: Every day | ORAL | Status: DC
  Administered 2023-10-09 – 2023-10-10 (×3): 10 mg via ORAL
  Filled 2023-10-09 (×2): qty 2

## 2023-10-09 MED ORDER — AZITHROMYCIN 500 MG PO TABS
500.0000 mg | ORAL_TABLET | Freq: Every day | ORAL | Status: DC
  Administered 2023-10-09 – 2023-10-10 (×3): 500 mg via ORAL
  Filled 2023-10-09: qty 1
  Filled 2023-10-09: qty 2

## 2023-10-09 MED ORDER — ACETAMINOPHEN 650 MG RE SUPP: 650.0000 mg | Freq: Four times a day (QID) | RECTAL | Status: DC | PRN

## 2023-10-09 MED ORDER — SODIUM CHLORIDE 0.9% FLUSH
3.0000 mL | Freq: Two times a day (BID) | INTRAVENOUS | Status: DC
  Administered 2023-10-09 – 2023-10-10 (×3): 3 mL via INTRAVENOUS

## 2023-10-09 NOTE — Progress Notes (Signed)
  Progress Note   Patient: Morgan [name] FMW:[id number] DOB: 03/22/1970 DOA: 10/08/2023     0 DOS: the patient was seen and examined on 10/09/2023    Assessment and Plan: Acute hypoxic resp failure (suspect b/l PNA) & LEFT pleural effusion  - Thoracentesis requested on 10/09/2023 - Azithromycin  500 mg PO daily  - IV zosyn  2.25 g q8hr  - IV fentanyl  q2 hr PRN  - Oxycodone  5 mg PO q4 hr PRN  - Senna 1 tab PO daily PRN   Elevated troponin  - ECHO pending  - ASA 81 mg PO daily  - Plavix  75 mg PO daily  - Crestor  10 mg PO daily   AKI on CKD3b and renal transplant pt  - Monitor  - Cellcept  500 mg PO bid - Prograf  4 mg PO daily and 3 mg PO at bedtime  - Prednisone  5 mg PO daily    Metabolic acidosis  - Sodium bicarb 1300 mg PO bid   DM2  - Novolog  SS ACHS  - Semglee  16 units sq bid   HTN  - Norvasc  5 mg PO daily   PAD  - ASA 81 mg PO daily  - Plavix  75 mg PO daily  - Crestor  10 mg PO daily   Anemia  - Monitor CBC and Hgb   Hx of CVA  - ASA 81 mg PO daily  - Plavix  75 mg PO daily  - Crestor  10 mg PO daily   Subjective: Pt seen and examined at the bedside. IV zosyn  and PO azithromycin  were started today as pt remains sob. Request for thoracentesis was also ordered today 10/09/2023.  Physical Exam: Vitals:   10/09/23 0945 10/09/23 1015 10/09/23 1030 10/09/23 1245  BP: (!) 97/37 90/65 93/64  110/64  Pulse: 99 (!) 101 (!) 105 95  Resp: (!) 22 (!) 23 16 20   Temp:    99 F (37.2 C)  TempSrc:    Oral  SpO2: 99% 99% 98% 99%  Weight:      Height:       Physical Exam HENT:     Head: Normocephalic.  Cardiovascular:     Rate and Rhythm: Normal rate.  Pulmonary:     Effort: Pulmonary effort is normal.     Comments: Coarse breath sounds b/l Abdominal:     Palpations: Abdomen is soft.  Musculoskeletal:        General: Normal range of motion.  Skin:    General: Skin is warm.  Neurological:     Mental Status: She is alert and oriented to person, place, and  time.  Psychiatric:        Mood and Affect: Mood normal.       Disposition: Status is: Inpatient Remains inpatient appropriate because: IV antibx +/- thoracentesis   Planned Discharge Destination: Barriers to discharge: As above    Time spent: 35 minutes  Author: Jettson Crable , MD 10/09/2023 1:11 PM  For on call review [email or web address].

## 2023-10-09 NOTE — Progress Notes (Signed)
 Patient received from ED, V/S obtained, CCMD notified, CHG bath given, all needs met, call bell in reach.    10/09/23 1323  Vitals  Temp 98.4 F (36.9 C)  Temp Source Oral  BP (!) 128/57  MAP (mmHg) 78  BP Location Left Leg  BP Method Automatic  Patient Position (if appropriate) Lying  Pulse Rate 97  Pulse Rate Source Monitor  ECG Heart Rate 97  Resp 18  MEWS COLOR  MEWS Score Color Green  Oxygen Therapy  SpO2 98 %  O2 Device Room Air  Pain Assessment  Pain Scale 0-10  Pain Score 0  MEWS Score  MEWS Temp 0  MEWS Systolic 0  MEWS Pulse 0  MEWS RR 0  MEWS LOC 0  MEWS Score 0

## 2023-10-09 NOTE — Progress Notes (Signed)
 Pt has active fistula in BUE. Received order from ED provider to stick pt in [place] for USPIV

## 2023-10-09 NOTE — H&P (Signed)
 History and Physical    Morgan [name] FMW:[id number] DOB: [birth year] DOA: 10/08/2023  PCP: Orvis Glendia Ditch, MD   Patient coming from: Home   Chief Complaint: SOB   HPI: Morgan [name] is a 53 y.o. female with medical history significant for hypertension, insulin -dependent diabetes mellitus, PAD, CVA, and ESRD s/p renal transplant who presents with acute onset shortness of breath.  History is limited by the patient's clinical condition.  She developed shortness of breath acutely tonight, was noted to have some frothy sputum, denies chest pain, and was found to be hypoxic and tachycardic with EMS.  She was placed on CPAP and given 4 doses of nitroglycerin  per report, possibly for hypertension as the patient denies chest pain.  ED Course: Upon arrival to the ED, patient is found to be afebrile and saturating 100% on BiPAP with tachypnea, tachycardia, and stable blood pressure.  He EKG demonstrates sinus tachycardia with rate 122 and repolarization abnormality.  Chest x-ray notable for hazy opacities overlying the left costophrenic angle which is felt to represent a pleural effusion, as well as minimal basilar opacities.  Labs are most notable for bicarbonate 16, creatinine 3.50, glucose 345, normal WBC, normal lactate, hemoglobin 9.9, BNP 497, and troponin 166.  Patient was placed on BiPAP in the ED.  Review of Systems:  ROS limited by patient's clinical condition.  Past Medical History:  Diagnosis Date   Anemia    low iron   Constipation    when given iron   Coronary artery disease    Diabetes mellitus without complication (HCC)    type 2   Diabetic retinopathy (HCC)    H/O detached retina repair    bilateral   History of CVA (cerebrovascular accident)    Hyperlipidemia    Hypertension    Kidney transplanted    Pneumonia    PVD (peripheral vascular disease) (HCC)    S/P drug eluting coronary stent placement 08/24/2021   AT [facility] Dr [facility]     Past Surgical History:  Procedure Laterality Date   ARTERIOVENOUS GRAFT PLACEMENT Left 07/28/2010   AV FISTULA PLACEMENT Right 11/25/2015   Procedure: INSERTION OF ARTERIOVENOUS (AV) GORE-TEX GRAFT RIGHT  ARM USING 4-7 MM X 45 CM STRETCH GRAFT.;  Surgeon: Lonni GORMAN Blade, MD;  Location: [facility] OR;  Service: Vascular;  Laterality: Right;   basilic vein transposition Left 02/26/2009   CARDIAC CATHETERIZATION     CESAREAN SECTION     CHOLECYSTECTOMY N/A 10/08/2014   Procedure: LAPAROSCOPIC CHOLECYSTECTOMY ;  Surgeon: Debby Shipper, MD;  Location: [facility] OR;  Service: General;  Laterality: N/A;   EYE SURGERY     retinal detachment   KIDNEY TRANSPLANT  04/28/2016   REVISION OF ARTERIOVENOUS GORETEX GRAFT Left 05/13/2015   Procedure: REVISION OF ARTERIOVENOUS GORETEX GRAFT;  Surgeon: Lonni GORMAN Blade, MD;  Location: [facility] OR;  Service: Vascular;  Laterality: Left;    Social History:   reports that she has never smoked. She has never used smokeless tobacco. She reports that she does not drink alcohol and does not use drugs.  Allergies  Allergen Reactions   Fluorescein Other (See Comments) and Shortness Of Breath    Hypertension  Other Reaction(s): Hypertension   Iodinated Contrast Media Anaphylaxis, Hives and Other (See Comments)    Diffuse swelling   Iohexol  Anaphylaxis, Hives, Itching, Swelling and Other (See Comments)    13 HR PRE-MEDS REQUIRED   Latex Itching   Acetaminophen  Other (See Comments)    Other reaction(s): Other (  See Comments)  CANNOT TAKE DUE TO KIDNEY  CANNOT TAKE DUE TO KIDNEY  CANNOT TAKE DUE TO KIDNEY  Other reaction(s): Other (See Comments)   CANNOT TAKE DUE TO KIDNEY   Egg Solids, Whole Other (See Comments)    CANNOT TAKE DUE TO AUTO IMMUNE DISORDER   Egg White (Egg Protein)     Other reaction(s): Other (See Comments)  CANNOT TAKE DUE TO AUTO IMMUNE DISORDER   CANNOT TAKE DUE TO AUTO IMMUNE DISORDER  Other reaction(s): Other (See Comments)    CANNOT TAKE DUE TO AUTO IMMUNE DISORDER   Nsaids Other (See Comments)    CANNOT TAKE DUE TO KIDNEY   CANNOT TAKE DUE TO KIDNEY   Shellfish Allergy Other (See Comments)    Other reaction(s): Other (See Comments)  CANNOT TAKE DUE TO KIDNEY TRANSPLANT   CANNOT TAKE DUE TO KIDNEY TRANSPLANT   Lactose Nausea Only and Other (See Comments)    Other reaction(s): Other (See Comments)   Misc. Sulfonamide Containing Compounds Itching    Other reaction(s): Other (See Comments)   CANNOT TAKE DUE TO KIDNEY TRANSPLANT    Family History  Problem Relation Age of Onset   Diabetes Maternal Grandmother    Gout Maternal Uncle      Prior to Admission medications   Medication Sig Start Date End Date Taking? Authorizing Provider  amLODipine  (NORVASC ) 5 MG tablet Take 5 mg by mouth daily. 01/03/18  Yes [provider]  aspirin  EC 81 MG tablet Take 81 mg by mouth daily. Swallow whole.   Yes [provider]  b complex-vitamin c-folic acid  (NEPHRO-VITE) 0.8 MG TABS tablet Take 1 tablet by mouth daily.   Yes [provider]  clopidogrel  (PLAVIX ) 75 MG tablet Take 75 mg by mouth daily.   Yes [provider]  FARXIGA 10 MG TABS tablet Take 10 mg by mouth daily. 09/08/23  Yes [provider]  Insulin  Aspart FlexPen (NOVOLOG ) 100 UNIT/ML Inject 8-13 Units into the skin 3 (three) times daily with meals. Inject per sliding scale:  70-200    8 units 201-250  9 units 251-300  10 units 301-350  11 units 351-400  12 units 401+       13 units 10/23/21  Yes [provider]  insulin  degludec (TRESIBA ) 200 UNIT/ML FlexTouch Pen Inject 14 Units into the skin daily with lunch.   Yes [provider]  levonorgestrel (MIRENA) 20 MCG/24HR IUD 1 each by Intrauterine route once.   Yes [provider]  loratadine (CLARITIN) 10 MG tablet Take 10 mg by mouth daily as needed (seasonal allergies.).   Yes [provider]  MOUNJARO 7.5 MG/0.5ML Pen  Inject 7.5 mg into the skin once a week. Inject on Tuesday. 05/13/23  Yes [provider]  mycophenolate  (CELLCEPT ) 250 MG capsule Take 500 mg by mouth in the morning and at bedtime. Take two capsules (500mg ) by mouth every 12 hours to prevent organ rejection.   Yes [provider]  predniSONE  (DELTASONE ) 5 MG tablet Take 5 mg by mouth daily with breakfast.   Yes [provider]  rosuvastatin  (CRESTOR ) 20 MG tablet Take 1 tablet (20 mg total) by mouth daily. Patient taking differently: Take 10 mg by mouth daily. Take 0.5 tablet (10mg ) by mouth once daily. 09/11/21 02/26/24 Yes Elgergawy, Brayton RAMAN, MD  senna-docusate (SENOKOT-S) 8.6-50 MG tablet Take 2 tablets by mouth in the morning and at bedtime. 09/25/23  Yes [provider]  sodium bicarbonate  650 MG tablet  Take 1,300 mg by mouth in the morning and at bedtime. 07/04/20  Yes [provider]  tacrolimus  (PROGRAF ) 1 MG capsule Take 3-4 mg by mouth in the morning and at bedtime. Take 4 capsules (4mg ) by mouth in the morning and 3 capsules (3mg ) at bedtime.   Yes [provider]    Physical Exam: Vitals:   10/09/23 0047 10/09/23 0130 10/09/23 0145 10/09/23 0200  BP: 116/68 99/62 (!) 102/54 (!) 94/54  Pulse: (!) 106 100 (!) 108 (!) 108  Resp: (!) 21 (!) 24 (!) 27 (!) 24  Temp:      TempSrc:      SpO2: 100% 100% 97% 95%    Constitutional: NAD, no pallor or diaphoresis   Eyes: PERTLA, lids and conjunctivae normal ENMT: Mucous membranes are moist. Posterior pharynx clear of any exudate or lesions.   Neck: supple, no masses  Respiratory: Labored respirations. No wheezing .  Cardiovascular: S1 & S2 heard, regular rate and rhythm. Trace lower leg edema.  Abdomen: No tenderness, soft. Bowel sounds active.  Musculoskeletal: no clubbing / cyanosis. No joint deformity upper and lower extremities.   Skin: no significant rashes, lesions, ulcers. Warm, dry, well-perfused. Neurologic: CN 2-12 grossly  intact. Moving all extremities. Alert and oriented.  Psychiatric: Calm. Cooperative.    Labs and Imaging on Admission: I have personally reviewed following labs and imaging studies  CBC: Recent Labs  Lab 10/08/23 2159 10/08/23 2231  WBC 10.5  --   NEUTROABS 8.4*  --   HGB 9.9* 10.2*  9.9*  HCT 32.4* 30.0*  29.0*  MCV 89.5  --   PLT 204  --    Basic Metabolic Panel: Recent Labs  Lab 10/08/23 2231  NA 143  143  K 3.7  3.7  CL 115*  GLUCOSE 345*  BUN 34*  CREATININE 3.50*   GFR: CrCl cannot be calculated (Unknown ideal weight.). Liver Function Tests: No results for input(s): AST, ALT, ALKPHOS, BILITOT, PROT, ALBUMIN  in the last 168 hours. No results for input(s): LIPASE, AMYLASE in the last 168 hours. No results for input(s): AMMONIA in the last 168 hours. Coagulation Profile: No results for input(s): INR, PROTIME in the last 168 hours. Cardiac Enzymes: No results for input(s): CKTOTAL, CKMB, CKMBINDEX, TROPONINI in the last 168 hours. BNP (last 3 results) No results for input(s): PROBNP in the last 8760 hours. HbA1C: No results for input(s): HGBA1C in the last 72 hours. CBG: No results for input(s): GLUCAP in the last 168 hours. Lipid Profile: No results for input(s): CHOL, HDL, LDLCALC, TRIG, CHOLHDL, LDLDIRECT in the last 72 hours. Thyroid  Function Tests: No results for input(s): TSH, T4TOTAL, FREET4, T3FREE, THYROIDAB in the last 72 hours. Anemia Panel: No results for input(s): VITAMINB12, FOLATE, FERRITIN, TIBC, IRON, RETICCTPCT in the last 72 hours. Urine analysis:    Component Value Date/Time   COLORURINE ORANGE (A) 09/22/2014 1415   APPEARANCEUR CLOUDY (A) 09/22/2014 1415   LABSPEC 1.014 09/22/2014 1415   PHURINE 8.0 09/22/2014 1415   GLUCOSEU 500 (A) 09/22/2014 1415   HGBUR LARGE (A) 09/22/2014 1415   BILIRUBINUR neg 10/06/2014 1359   KETONESUR 15 (A) 09/22/2014 1415    PROTEINUR 100 10/06/2014 1359   PROTEINUR 100 (A) 09/22/2014 1415   UROBILINOGEN 0.2 10/06/2014 1359   UROBILINOGEN 0.2 09/22/2014 1415   NITRITE neg 10/06/2014 1359   NITRITE NEGATIVE 09/22/2014 1415   LEUKOCYTESUR Negative 10/06/2014 1359   Sepsis Labs: @LABRCNTIP (procalcitonin:4,lacticidven:4) )No results found for this or any previous visit (  from the past 240 hours).   Radiological Exams on Admission: DG Chest Portable 1 View Result Date: 10/08/2023 CLINICAL DATA:  Shortness of breath EXAM: PORTABLE CHEST 1 VIEW COMPARISON:  Chest x-ray 10/06/2014 FINDINGS: There are hazy opacities overlying the left costophrenic angle likely related to pleural effusion. There are minimal patchy opacities in the lung bases. Lung apices are clear. There is no evidence for pneumothorax. The cardiomediastinal silhouette is within normal limits. There are vascular stents in the left axillary region. IMPRESSION: 1. Hazy opacities overlying the left costophrenic angle likely related to pleural effusion. 2. Minimal patchy opacities in the lung bases may represent atelectasis or infection. Electronically Signed   By: Greig Pique M.D.   On: 10/08/2023 23:21    EKG: Independently reviewed. Sinus tachycardia, rate 122, repolarization abnormality.   Assessment/Plan   1. Acute hypoxic respiratory failure  - Acute-onset, reportedly had frothy sputum, improving on BiPAP, may have been flash pulmonary edema  - Continue BiPAP as needed, check D-dimer, consider chest CT    2. Elevated troponin  - Patient denies chest pain  - Continue cardiac monitoring, repeat troponin, treat underlying respiratory failure, continue DAPT and statin   3. AKI superimposed on CKD 3B; renal transplant recipient; metabolic acidosis   - SCr is 3.5 on admission, down from 4.3 when discharged from [facility] on 7/30; baseline is reported to be ~1.5  - Renally-dose medications, continue bicarbonate, prednisone , Cellcept , and Prograf , repeat chem  panel in am    4. Type II DM  - A1c was 10.6% in July 2025  - Check CBGs, continue basal and short-acting insulin     5. Hypertension  - Continue Norvasc    6. PAD  - No acute ischemia  - Continue DAPT and statin    7. Anemia  - Hgb is 9.9 on admission, down from 12.6 on July 25th  - No overt bleeding, repeat CBC in am    8. Hx of CVA  - DAPT and statin    DVT prophylaxis: sq heparin   Code Status: Full  Level of Care: Level of care: Progressive Family Communication: None present  Disposition Plan:  Patient is from: Home  Anticipated d/c is to: Home  Anticipated d/c date is: 10/11/23  Patient currently: Pending improved respiratory status, repeat troponin, D-dimer Consults called: None  Admission status: Inpatient     Evalene GORMAN Sprinkles, MD [facility]  10/09/2023, 2:38 AM

## 2023-10-09 NOTE — Progress Notes (Signed)
   10/09/23 0047  Vitals  BP 116/68  MAP (mmHg) 83  Pulse Rate (!) 106  ECG Heart Rate (!) 107  Resp (!) 21  Oxygen Therapy  SpO2 100 %   Rt. Arm AV graft access with 17 gage needle for lab stat lab draw. No complications encountered.

## 2023-10-09 NOTE — H&P (Signed)
 History and Physical    Morgan [name] FMW:[id number] DOB: 04-05-1970 DOA: 10/08/2023  PCP: Orvis Glendia Ditch, MD   Patient coming from: Home   Chief Complaint: SOB   HPI: Morgan [name] is a 53 y.o. female with medical history significant for hypertension, insulin -dependent diabetes mellitus, PAD, CVA, and ESRD s/p renal transplant who presents with acute onset shortness of breath.  History is limited by the patient's clinical condition.  She developed shortness of breath acutely tonight, was noted to have some frothy sputum, denies chest pain, and was found to be hypoxic and tachycardic with EMS.  She was placed on CPAP and given 4 doses of nitroglycerin  per report, possibly for hypertension as the patient denies chest pain.  ED Course: Upon arrival to the ED, patient is found to be afebrile and saturating 100% on BiPAP with tachypnea, tachycardia, and stable blood pressure.  He EKG demonstrates sinus tachycardia with rate 122 and repolarization abnormality.  Chest x-ray notable for hazy opacities overlying the left costophrenic angle which is felt to represent a pleural effusion, as well as minimal basilar opacities.  Labs are most notable for bicarbonate 16, creatinine 3.50, glucose 345, normal WBC, normal lactate, hemoglobin 9.9, BNP 497, and troponin 166.  Patient was placed on BiPAP in the ED.  Review of Systems:  ROS limited by patient's clinical condition.  Past Medical History:  Diagnosis Date   Anemia    low iron   Constipation    when given iron   Coronary artery disease    Diabetes mellitus without complication (HCC)    type 2   Diabetic retinopathy (HCC)    H/O detached retina repair    bilateral   History of CVA (cerebrovascular accident)    Hyperlipidemia    Hypertension    Kidney transplanted    Pneumonia    PVD (peripheral vascular disease) (HCC)    S/P drug eluting coronary stent placement 08/24/2021   AT [facility]     Past Surgical History:  Procedure Laterality Date   ARTERIOVENOUS GRAFT PLACEMENT Left 07/28/2010   AV FISTULA PLACEMENT Right 11/25/2015   Procedure: INSERTION OF ARTERIOVENOUS (AV) GORE-TEX GRAFT RIGHT  ARM USING 4-7 MM X 45 CM STRETCH GRAFT.;  Surgeon: Lonni GORMAN Blade, MD;  Location: [facility] OR;  Service: Vascular;  Laterality: Right;   basilic vein transposition Left 02/26/2009   CARDIAC CATHETERIZATION     CESAREAN SECTION     CHOLECYSTECTOMY N/A 10/08/2014   Procedure: LAPAROSCOPIC CHOLECYSTECTOMY ;  Surgeon: Debby Shipper, MD;  Location: [facility] OR;  Service: General;  Laterality: N/A;   EYE SURGERY     retinal detachment   KIDNEY TRANSPLANT  04/28/2016   REVISION OF ARTERIOVENOUS GORETEX GRAFT Left 05/13/2015   Procedure: REVISION OF ARTERIOVENOUS GORETEX GRAFT;  Surgeon: Lonni GORMAN Blade, MD;  Location: [facility] OR;  Service: Vascular;  Laterality: Left;    Social History:   reports that she has never smoked. She has never used smokeless tobacco. She reports that she does not drink alcohol and does not use drugs.  Allergies  Allergen Reactions   Fluorescein Other (See Comments) and Shortness Of Breath    Hypertension  Other Reaction(s): Hypertension   Iodinated Contrast Media Anaphylaxis, Hives and Other (See Comments)    Diffuse swelling   Iohexol  Anaphylaxis, Hives, Itching, Swelling and Other (See Comments)    13 HR PRE-MEDS REQUIRED   Latex Itching   Acetaminophen  Other (See Comments)    Other reaction(s): Other (  See Comments)  CANNOT TAKE DUE TO KIDNEY  CANNOT TAKE DUE TO KIDNEY  CANNOT TAKE DUE TO KIDNEY  Other reaction(s): Other (See Comments)   CANNOT TAKE DUE TO KIDNEY   Egg Solids, Whole Other (See Comments)    CANNOT TAKE DUE TO AUTO IMMUNE DISORDER   Egg White (Egg Protein)     Other reaction(s): Other (See Comments)  CANNOT TAKE DUE TO AUTO IMMUNE DISORDER   CANNOT TAKE DUE TO AUTO IMMUNE DISORDER  Other reaction(s): Other (See Comments)    CANNOT TAKE DUE TO AUTO IMMUNE DISORDER   Nsaids Other (See Comments)    CANNOT TAKE DUE TO KIDNEY   CANNOT TAKE DUE TO KIDNEY   Shellfish Allergy Other (See Comments)    Other reaction(s): Other (See Comments)  CANNOT TAKE DUE TO KIDNEY TRANSPLANT   CANNOT TAKE DUE TO KIDNEY TRANSPLANT   Lactose Nausea Only and Other (See Comments)    Other reaction(s): Other (See Comments)   Misc. Sulfonamide Containing Compounds Itching    Other reaction(s): Other (See Comments)   CANNOT TAKE DUE TO KIDNEY TRANSPLANT    Family History  Problem Relation Age of Onset   Diabetes Maternal Grandmother    Gout Maternal Uncle      Prior to Admission medications   Medication Sig Start Date End Date Taking? Authorizing Provider  amLODipine  (NORVASC ) 5 MG tablet Take 5 mg by mouth daily. 01/03/18  Yes [provider]  aspirin  EC 81 MG tablet Take 81 mg by mouth daily. Swallow whole.   Yes [provider]  b complex-vitamin c-folic acid  (NEPHRO-VITE) 0.8 MG TABS tablet Take 1 tablet by mouth daily.   Yes [provider]  clopidogrel  (PLAVIX ) 75 MG tablet Take 75 mg by mouth daily.   Yes [provider]  FARXIGA 10 MG TABS tablet Take 10 mg by mouth daily. 09/08/23  Yes [provider]  Insulin  Aspart FlexPen (NOVOLOG ) 100 UNIT/ML Inject 8-13 Units into the skin 3 (three) times daily with meals. Inject per sliding scale:  70-200    8 units 201-250  9 units 251-300  10 units 301-350  11 units 351-400  12 units 401+       13 units 10/23/21  Yes [provider]  insulin  degludec (TRESIBA ) 200 UNIT/ML FlexTouch Pen Inject 14 Units into the skin daily with lunch.   Yes [provider]  levonorgestrel (MIRENA) 20 MCG/24HR IUD 1 each by Intrauterine route once.   Yes [provider]  loratadine (CLARITIN) 10 MG tablet Take 10 mg by mouth daily as needed (seasonal allergies.).   Yes [provider]  MOUNJARO 7.5 MG/0.5ML Pen  Inject 7.5 mg into the skin once a week. Inject on Tuesday. 05/13/23  Yes [provider]  mycophenolate  (CELLCEPT ) 250 MG capsule Take 500 mg by mouth in the morning and at bedtime. Take two capsules (500mg ) by mouth every 12 hours to prevent organ rejection.   Yes [provider]  predniSONE  (DELTASONE ) 5 MG tablet Take 5 mg by mouth daily with breakfast.   Yes [provider]  rosuvastatin  (CRESTOR ) 20 MG tablet Take 1 tablet (20 mg total) by mouth daily. Patient taking differently: Take 10 mg by mouth daily. Take 0.5 tablet (10mg ) by mouth once daily. 09/11/21 02/26/24 Yes Elgergawy, Brayton RAMAN, MD  senna-docusate (SENOKOT-S) 8.6-50 MG tablet Take 2 tablets by mouth in the morning and at bedtime. 09/25/23  Yes [provider]  sodium bicarbonate  650 MG tablet  Take 1,300 mg by mouth in the morning and at bedtime. 07/04/20  Yes [provider]  tacrolimus  (PROGRAF ) 1 MG capsule Take 3-4 mg by mouth in the morning and at bedtime. Take 4 capsules (4mg ) by mouth in the morning and 3 capsules (3mg ) at bedtime.   Yes [provider]    Physical Exam: Vitals:   10/09/23 0047 10/09/23 0130 10/09/23 0145 10/09/23 0200  BP: 116/68 99/62 (!) 102/54 (!) 94/54  Pulse: (!) 106 100 (!) 108 (!) 108  Resp: (!) 21 (!) 24 (!) 27 (!) 24  Temp:      TempSrc:      SpO2: 100% 100% 97% 95%    Constitutional: NAD, no pallor or diaphoresis   Eyes: PERTLA, lids and conjunctivae normal ENMT: Mucous membranes are moist. Posterior pharynx clear of any exudate or lesions.   Neck: supple, no masses  Respiratory: Labored respirations. No wheezing .  Cardiovascular: S1 & S2 heard, regular rate and rhythm. Trace lower leg edema.  Abdomen: No tenderness, soft. Bowel sounds active.  Musculoskeletal: no clubbing / cyanosis. No joint deformity upper and lower extremities.   Skin: no significant rashes, lesions, ulcers. Warm, dry, well-perfused. Neurologic: CN 2-12 grossly  intact. Moving all extremities. Alert and oriented.  Psychiatric: Calm. Cooperative.    Labs and Imaging on Admission: I have personally reviewed following labs and imaging studies  CBC: Recent Labs  Lab 10/08/23 2159 10/08/23 2231  WBC 10.5  --   NEUTROABS 8.4*  --   HGB 9.9* 10.2*  9.9*  HCT 32.4* 30.0*  29.0*  MCV 89.5  --   PLT 204  --    Basic Metabolic Panel: Recent Labs  Lab 10/08/23 2231  NA 143  143  K 3.7  3.7  CL 115*  GLUCOSE 345*  BUN 34*  CREATININE 3.50*   GFR: CrCl cannot be calculated (Unknown ideal weight.). Liver Function Tests: No results for input(s): AST, ALT, ALKPHOS, BILITOT, PROT, ALBUMIN  in the last 168 hours. No results for input(s): LIPASE, AMYLASE in the last 168 hours. No results for input(s): AMMONIA in the last 168 hours. Coagulation Profile: No results for input(s): INR, PROTIME in the last 168 hours. Cardiac Enzymes: No results for input(s): CKTOTAL, CKMB, CKMBINDEX, TROPONINI in the last 168 hours. BNP (last 3 results) No results for input(s): PROBNP in the last 8760 hours. HbA1C: No results for input(s): HGBA1C in the last 72 hours. CBG: No results for input(s): GLUCAP in the last 168 hours. Lipid Profile: No results for input(s): CHOL, HDL, LDLCALC, TRIG, CHOLHDL, LDLDIRECT in the last 72 hours. Thyroid  Function Tests: No results for input(s): TSH, T4TOTAL, FREET4, T3FREE, THYROIDAB in the last 72 hours. Anemia Panel: No results for input(s): VITAMINB12, FOLATE, FERRITIN, TIBC, IRON, RETICCTPCT in the last 72 hours. Urine analysis:    Component Value Date/Time   COLORURINE ORANGE (A) 09/22/2014 1415   APPEARANCEUR CLOUDY (A) 09/22/2014 1415   LABSPEC 1.014 09/22/2014 1415   PHURINE 8.0 09/22/2014 1415   GLUCOSEU 500 (A) 09/22/2014 1415   HGBUR LARGE (A) 09/22/2014 1415   BILIRUBINUR neg 10/06/2014 1359   KETONESUR 15 (A) 09/22/2014 1415    PROTEINUR 100 10/06/2014 1359   PROTEINUR 100 (A) 09/22/2014 1415   UROBILINOGEN 0.2 10/06/2014 1359   UROBILINOGEN 0.2 09/22/2014 1415   NITRITE neg 10/06/2014 1359   NITRITE NEGATIVE 09/22/2014 1415   LEUKOCYTESUR Negative 10/06/2014 1359   Sepsis Labs: @LABRCNTIP (procalcitonin:4,lacticidven:4) )No results found for this or any previous visit (  from the past 240 hours).   Radiological Exams on Admission: DG Chest Portable 1 View Result Date: 10/08/2023 CLINICAL DATA:  Shortness of breath EXAM: PORTABLE CHEST 1 VIEW COMPARISON:  Chest x-ray 10/06/2014 FINDINGS: There are hazy opacities overlying the left costophrenic angle likely related to pleural effusion. There are minimal patchy opacities in the lung bases. Lung apices are clear. There is no evidence for pneumothorax. The cardiomediastinal silhouette is within normal limits. There are vascular stents in the left axillary region. IMPRESSION: 1. Hazy opacities overlying the left costophrenic angle likely related to pleural effusion. 2. Minimal patchy opacities in the lung bases may represent atelectasis or infection. Electronically Signed   By: Greig Pique M.D.   On: 10/08/2023 23:21    EKG: Independently reviewed. Sinus tachycardia, rate 122, repolarization abnormality.   Assessment/Plan   1. Acute hypoxic respiratory failure  - Acute-onset, reportedly had frothy sputum, improving on BiPAP, may have been flash pulmonary edema  - Continue BiPAP as needed, check D-dimer, consider chest CT    2. Elevated troponin  - Patient denies chest pain  - Continue cardiac monitoring, repeat troponin, treat underlying respiratory failure, continue DAPT and statin   3. AKI superimposed on CKD 3B; renal transplant recipient; metabolic acidosis   - SCr is 3.5 on admission, down from 4.3 when discharged from [facility] on 7/30; baseline is reported to be ~1.5  - Renally-dose medications, continue bicarbonate, prednisone , Cellcept , and Prograf , repeat chem  panel in am    4. Type II DM  - A1c was 10.6% in July 2025  - Check CBGs, continue basal and short-acting insulin     5. Hypertension  - Continue Norvasc    6. PAD  - No acute ischemia  - Continue DAPT and statin    7. Anemia  - Hgb is 9.9 on admission, down from 12.6 on July 25th  - No overt bleeding, repeat CBC in am    8. Hx of CVA  - DAPT and statin    DVT prophylaxis: sq heparin   Code Status: Full  Level of Care: Level of care: Progressive Family Communication: None present  Disposition Plan:  Patient is from: Home  Anticipated d/c is to: Home  Anticipated d/c date is: 10/11/23  Patient currently: Pending improved respiratory status, repeat troponin, D-dimer Consults called: None  Admission status: Inpatient     Evalene GORMAN Sprinkles, MD [facility]  10/09/2023, 2:38 AM

## 2023-10-09 NOTE — Progress Notes (Signed)
 Patient refused labs with phlebotomy and wanted to get drawn from fistula site, requested with HD RN and MD notified.

## 2023-10-09 NOTE — ED Notes (Signed)
 CCMD notified of patient movement in ED.

## 2023-10-09 NOTE — ED Notes (Signed)
 Unable to retrieve blod from patient. PA Rob attempted, This RN attempted, Milo PEAK attempted and IV team attempted to draw blood with no success. MD Rancour agreed to allow Dialysis nurse to pull off of fistula in right arm .

## 2023-10-09 NOTE — ED Notes (Signed)
 Per MD Opyd trail pt off of bipap. RT currently at bedside after bipap removal.

## 2023-10-10 ENCOUNTER — Inpatient Hospital Stay (HOSPITAL_COMMUNITY)

## 2023-10-10 DIAGNOSIS — J9601 Acute respiratory failure with hypoxia: Secondary | ICD-10-CM | POA: Diagnosis not present

## 2023-10-10 DIAGNOSIS — R7989 Other specified abnormal findings of blood chemistry: Secondary | ICD-10-CM | POA: Diagnosis not present

## 2023-10-10 LAB — GLUCOSE, CAPILLARY
Glucose-Capillary: 89 mg/dL (ref 70–99)
Glucose-Capillary: 112 mg/dL — ABNORMAL HIGH (ref 70–99)

## 2023-10-10 LAB — ECHOCARDIOGRAM COMPLETE
AR max vel: 1.7 cm2
AV Area VTI: 1.87 cm2
AV Area mean vel: 1.87 cm2
AV Mean grad: 3 mmHg
AV Peak grad: 5 mmHg
Ao pk vel: 1.12 m/s
Area-P 1/2: 5.38 cm2
Height: 62 in
S' Lateral: 2.6 cm
Weight: 3118.19 [oz_av]

## 2023-10-10 MED ORDER — LIDOCAINE-EPINEPHRINE 1 %-1:100000 IJ SOLN
INTRAMUSCULAR | Status: AC
  Filled 2023-10-10: qty 1

## 2023-10-10 MED ORDER — INSULIN ASPART 100 UNIT/ML IJ SOLN
8.0000 [IU] | Freq: Three times a day (TID) | INTRAMUSCULAR | Status: DC
  Administered 2023-10-10: 8 [IU] via SUBCUTANEOUS

## 2023-10-10 NOTE — Progress Notes (Signed)
 Patient refused to take all the morning medications at this time, she voiced that she is been taking 8 unit of insulin  for meal coverage which has not ordered for breakfast and she wants to get this before the breakfast and will take morning medications after, MD notified.

## 2023-10-10 NOTE — Discharge Summary (Signed)
 Physician Discharge Summary   Patient: Morgan [name] MRN: [id number] DOB: 10-Sep-1970  Admit date:     10/08/2023  Discharge date: 10/10/23; Patient left AGAINST MEDICAL ADVICE  Discharge Physician: Elgin Lam, MD   PCP: Orvis Glendia Ditch, MD   Recommendations at discharge:  PCP follow-up Follow-up echocardiogram results  Discharge Diagnoses: Principal Problem:   Acute respiratory failure with hypoxia [facility]) Active Problems:   S/p cadaver renal transplant CKD stage IIIa   Diabetes mellitus, type 2 (HCC)   Essential (primary) hypertension   PAD (peripheral artery disease) (HCC)   History of CVA (cerebrovascular accident)   Normocytic anemia   Elevated troponin  Resolved Problems:   * No resolved hospital problems. [name] Course: RASHAUNDA RAHL is a 53 y.o. female with a history of hypertension, diabetes mellitus, PAD, CVA, ESRD status post renal transplant.  Patient presented secondary to shortness of breath with concern for flash pulmonary edema.  Patient required BiPAP on admission with improvement of symptoms.  Patient with associated elevated troponin and AKI.  Echocardiogram was ordered with results pending prior to patient deciding to leave AGAINST MEDICAL ADVICE.   Consultants: None Procedures performed: Echocardiogram Disposition: Left AGAINST MEDICAL ADVICE Diet recommendation: Renal diet   DISCHARGE MEDICATION: Allergies as of 10/10/2023       Reactions   Fluorescein Other (See Comments), Shortness Of Breath   Hypertension Other Reaction(s): Hypertension   Iodinated Contrast Media Anaphylaxis, Hives, Other (See Comments)   Diffuse swelling   Iohexol  Anaphylaxis, Hives, Itching, Swelling, Other (See Comments)   13 HR PRE-MEDS REQUIRED   Latex Itching   Acetaminophen  Other (See Comments)   Other reaction(s): Other (See Comments) CANNOT TAKE DUE TO KIDNEY  CANNOT TAKE DUE TO KIDNEY  CANNOT TAKE DUE TO KIDNEY Other reaction(s):  Other (See Comments)   CANNOT TAKE DUE TO KIDNEY   Egg Solids, Whole Other (See Comments)   CANNOT TAKE DUE TO AUTO IMMUNE DISORDER   Egg White (egg Protein)    Other reaction(s): Other (See Comments) CANNOT TAKE DUE TO AUTO IMMUNE DISORDER  CANNOT TAKE DUE TO AUTO IMMUNE DISORDER Other reaction(s): Other (See Comments)   CANNOT TAKE DUE TO AUTO IMMUNE DISORDER   Nsaids Other (See Comments)   CANNOT TAKE DUE TO KIDNEY  CANNOT TAKE DUE TO KIDNEY   Shellfish Allergy Other (See Comments)   Other reaction(s): Other (See Comments) CANNOT TAKE DUE TO KIDNEY TRANSPLANT  CANNOT TAKE DUE TO KIDNEY TRANSPLANT   Lactose Nausea Only, Other (See Comments)   Other reaction(s): Other (See Comments)   Misc. Sulfonamide Containing Compounds Itching   Other reaction(s): Other (See Comments)   CANNOT TAKE DUE TO KIDNEY TRANSPLANT        Medication List     ASK your doctor about these medications    amLODipine  5 MG tablet Commonly known as: NORVASC  Take 5 mg by mouth daily.   aspirin  EC 81 MG tablet Take 81 mg by mouth daily. Swallow whole. Ask about: Which instructions should I use?   b complex-vitamin c-folic acid  0.8 MG Tabs tablet Take 1 tablet by mouth daily. Ask about: Which instructions should I use?   clopidogrel  75 MG tablet Commonly known as: PLAVIX  Take 75 mg by mouth daily.   Farxiga 10 MG Tabs tablet Generic drug: dapagliflozin propanediol Take 10 mg by mouth daily.   Insulin  Aspart FlexPen 100 UNIT/ML Commonly known as: NOVOLOG  Inject 8-13 Units into the skin 3 (three) times daily with meals. Inject  per sliding scale:  70-200    8 units 201-250  9 units 251-300  10 units 301-350  11 units 351-400  12 units 401+       13 units   insulin  degludec 200 UNIT/ML FlexTouch Pen Commonly known as: TRESIBA  Inject 14 Units into the skin daily with lunch. Ask about: Which instructions should I use?   levonorgestrel 20 MCG/24HR IUD Commonly known as: MIRENA 1 each by  Intrauterine route once.   loratadine 10 MG tablet Commonly known as: CLARITIN Take 10 mg by mouth daily as needed (seasonal allergies.).   Mounjaro 7.5 MG/0.5ML Pen Generic drug: tirzepatide Inject 7.5 mg into the skin once a week. Inject on Tuesday.   mycophenolate  250 MG capsule Commonly known as: CELLCEPT  Take 500 mg by mouth in the morning and at bedtime. Take two capsules (500mg ) by mouth every 12 hours to prevent organ rejection.   predniSONE  5 MG tablet Commonly known as: DELTASONE  Take 5 mg by mouth daily with breakfast.   rosuvastatin  20 MG tablet Commonly known as: CRESTOR  Take 1 tablet (20 mg total) by mouth daily.   senna-docusate 8.6-50 MG tablet Commonly known as: Senokot-S Take 2 tablets by mouth in the morning and at bedtime.   sodium bicarbonate  650 MG tablet Take 1,300 mg by mouth in the morning and at bedtime.   tacrolimus  1 MG capsule Commonly known as: PROGRAF  Take 3-4 mg by mouth in the morning and at bedtime. Take 4 capsules (4mg ) by mouth in the morning and 3 capsules (3mg ) at bedtime.        Discharge Exam: BP (!) 122/53 (BP Location: Left Leg)   Pulse 92   Temp 98.2 F (36.8 C) (Oral)   Resp 19   Ht 5' 2 (1.575 m)   Wt 88.4 kg   SpO2 100%   BMI 35.65 kg/m   General exam: Appears calm and comfortable Respiratory system: Respiratory effort normal. Cardiovascular system: S1 & S2 heard, RRR. Gastrointestinal system: Abdomen is nondistended, soft and nontender. Normal bowel sounds heard. Central nervous system: Alert and oriented. No focal neurological deficits. Psychiatry: Judgement and insight appear normal. Mood & affect appropriate.   Condition at discharge: fair  The results of significant diagnostics from this hospitalization (including imaging, microbiology, ancillary and laboratory) are listed below for reference.   Imaging Studies: DG Chest Portable 1 View Result Date: 10/08/2023 CLINICAL DATA:  Shortness of breath EXAM:  PORTABLE CHEST 1 VIEW COMPARISON:  Chest x-ray 10/06/2014 FINDINGS: There are hazy opacities overlying the left costophrenic angle likely related to pleural effusion. There are minimal patchy opacities in the lung bases. Lung apices are clear. There is no evidence for pneumothorax. The cardiomediastinal silhouette is within normal limits. There are vascular stents in the left axillary region. IMPRESSION: 1. Hazy opacities overlying the left costophrenic angle likely related to pleural effusion. 2. Minimal patchy opacities in the lung bases may represent atelectasis or infection. Electronically Signed   By: Greig Pique M.D.   On: 10/08/2023 23:21    Microbiology: Results for orders placed or performed during the hospital encounter of 10/08/23  Resp panel by RT-PCR (RSV, Flu A&B, Covid) Anterior Nasal Swab     Status: None   Collection Time: 10/09/23  2:30 AM   Specimen: Anterior Nasal Swab  Result Value Ref Range Status   SARS Coronavirus 2 by RT PCR NEGATIVE NEGATIVE Final   Influenza A by PCR NEGATIVE NEGATIVE Final   Influenza B by PCR NEGATIVE NEGATIVE  Final    Comment: (NOTE) The Xpert Xpress SARS-CoV-2/FLU/RSV plus assay is intended as an aid in the diagnosis of influenza from Nasopharyngeal swab specimens and should not be used as a sole basis for treatment. Nasal washings and aspirates are unacceptable for Xpert Xpress SARS-CoV-2/FLU/RSV testing.  Fact Sheet for Patients: BloggerCourse.com  Fact Sheet for Healthcare Providers: SeriousBroker.it  This test is not yet approved or cleared by the [place]  FDA and has been authorized for detection and/or diagnosis of SARS-CoV-2 by FDA under an Emergency Use Authorization (EUA). This EUA will remain in effect (meaning this test can be used) for the duration of the COVID-19 declaration under Section 564(b)(1) of the Act, 21 U.S.C. section 360bbb-3(b)(1), unless the authorization is  terminated or revoked.     Resp Syncytial Virus by PCR NEGATIVE NEGATIVE Final    Comment: (NOTE) Fact Sheet for Patients: BloggerCourse.com  Fact Sheet for Healthcare Providers: SeriousBroker.it  This test is not yet approved or cleared by the [place]  FDA and has been authorized for detection and/or diagnosis of SARS-CoV-2 by FDA under an Emergency Use Authorization (EUA). This EUA will remain in effect (meaning this test can be used) for the duration of the COVID-19 declaration under Section 564(b)(1) of the Act, 21 U.S.C. section 360bbb-3(b)(1), unless the authorization is terminated or revoked.  Performed at [facility], [address] [place], [place] [address]   Blood culture (routine x 2)     Status: None (Preliminary result)   Collection Time: 10/09/23  4:45 AM   Specimen: BLOOD RIGHT HAND  Result Value Ref Range Status   Specimen Description BLOOD RIGHT HAND  Final   Special Requests   Final    BOTTLES DRAWN AEROBIC AND ANAEROBIC Blood Culture adequate volume   Culture   Final    NO GROWTH 1 DAY Performed at [facility], [address] [place], [place] [address]    Report Status PENDING  Incomplete    Labs: CBC: Recent Labs  Lab 10/08/23 2159 10/08/23 2231 10/09/23 0450  WBC 10.5  --  7.4  NEUTROABS 8.4*  --   --   HGB 9.9* 10.2*  9.9* 9.6*  HCT 32.4* 30.0*  29.0* 30.4*  MCV 89.5  --  86.4  PLT 204  --  212   Basic Metabolic Panel: Recent Labs  Lab 10/08/23 2231 10/09/23 0450  NA 143  143 137  K 3.7  3.7 4.7  CL 115* 109  CO2  --  17*  GLUCOSE 345* 313*  BUN 34* 41*  CREATININE 3.50* 3.86*  CALCIUM   --  9.0    CBG: Recent Labs  Lab 10/09/23 0830 10/09/23 1211 10/09/23 1701 10/09/23 2148 10/10/23 0557  GLUCAP 224* 238* 156* 168* 89    Discharge time spent: 35 minutes.  Signed: Elgin Lam, MD [facility] 10/10/2023

## 2023-10-10 NOTE — Progress Notes (Signed)
SATURATION QUALIFICATIONS: (This note is used to comply with regulatory documentation for home oxygen)  Patient Saturations on Room Air at Rest = 100%  Patient Saturations on Room Air while Ambulating = 100%  Patient Saturations on 0 Liters of oxygen while Ambulating = 100%   

## 2023-10-10 NOTE — Procedures (Signed)
 Patient presented to IR for possible thoracentesis. Limited ultrasound examination of the left and right lungs showed no pleural fluid. No procedure performed. Images saved in Epic.   Warren Dais, AGACNP-BC 10/10/2023, 10:27 AM

## 2023-10-10 NOTE — Progress Notes (Signed)
 Explained the patient to wait for the discharge orders but patient wanted to leave on AMA, PIV removed, CCMD notified, patient was on RA. MD notified via page.

## 2023-10-10 NOTE — Hospital Course (Addendum)
 Morgan [name] is a 53 y.o. female with a history of hypertension, diabetes mellitus, PAD, CVA, ESRD status post renal transplant.  Patient presented secondary to shortness of breath with concern for flash pulmonary edema.  Patient required BiPAP on admission with improvement of symptoms.  Patient with associated elevated troponin and [name].  Echocardiogram was ordered with results pending prior to patient deciding to leave AGAINST MEDICAL ADVICE.

## 2023-10-11 ENCOUNTER — Other Ambulatory Visit (HOSPITAL_COMMUNITY): Payer: Self-pay

## 2023-10-14 LAB — CULTURE, BLOOD (ROUTINE X 2)
Culture: NO GROWTH
Special Requests: ADEQUATE

## 2023-10-17 NOTE — Progress Notes (Signed)
 This video encounter was conducted with the patient's (or proxy's) verbal consent via secure, interactive audio and video telecommunications.    The patient (or proxy) was instructed by the virtual care center staff to have this encounter in a suitably private space and to only have persons present to whom they give permission to participate. In addition, patient identity was confirmed by use of name and date of birth.   Telehealth visit was conducted with Morgan [name] via HIPAA compliant video platform.   Blood Sugar: unknown Virtual Visit  Lab Results  Component Value Date   HGBA1C 10.6 (H) 09/17/2023   HGBA1C 8.2 (H) 04/17/2022    Last Meal: snacking on peaches now  Model of Meter: Dexcom G 7  Pump model: n/a   Last dilated eye exam at a [facility] facility:   unknown  Patient educated on the ability to self-schedule follow-up labs and imaging and agrees to schedule their own follow-up labs and imaging. Patient understands to schedule as soon as possible.  Patient understands to MyChart message or call for assistance.

## 2023-10-17 NOTE — Progress Notes (Signed)
 Last visit was April 2025 (telemedicine).   PCP:   Kassie Alyce Sayre, MD  Chief complaint:  Type 2 Diabetes, uncontrolled   I have not seen her since June 2024.   History of Present Illness: Morgan [name] is a 53 y.o. year-old female who presents for follow up today for T2D.   She has been hospitalized recently for hypoxic respiratory failure (8/12-8/14). Was admitted in July for AKI s/p renal biopsy.   Most recent A1c: 10.6% done July 2025. (Significantly increased from previous level of 8.2).   The diagnosis of DM type II was made in 1998. She is s/p kidney transplant and has been on insulin  for many years.  She has a history of proliferative diabetic retinopathy and hx of ESRD now s/p renal transplant.   Cpeptide in May 2022 and it was low at 0.4.   She is taking 5 mg prednisone  daily on her maintenance dose.   Dexcom data: 14 days  Avg: 145 TIR 78%  TBR: 1%  Very high: 1%  GMI 6.8%  88% usage   Avg total insulin  about 32 units/day based on her smartpen data.   Current diabetes meds: Diabetes Meds:  8 units Humalog /Novolog  immediately before each meal. Correction scale given as well:180-230- 2 units extra (Novolog /Humalog ) 230-280- 4 units extra 280-330- 6 units extra  ETC  Tresiba  14 units twice daily.  Mounjaro 7.5/week  Farxiga  10 mg (prescribed by renal team).   Weight: reports 194 at home which is stable.   She is on less than half of the insulin  that she was taking before after being on GLP1.    She is injecting in the abdomen.  She knows to rotate injection sites frequently.  She denies any lumps or bumps at these injection sites.    She knows to correct a low blood sugar with 15 gram of carbohydrates/4-6 oz of juice/3-4 glucose tabs if her blood sugar was to drop below 70.     Most recent creatinine 3.7/GFR 14. Follows with transplant regularly. This is significantly worsened since her AKI incident.   Cardiovascular RR: Blood  pressure-has been normal recently.  Lipids- most recent LDL 85, HDL 32, improved from prior.   Review of Systems: ROS: Pertinent positives as per HPI. The remaining 10-System ROS, including general, HEENT, GI, Respiratory, Cardiac, Abdominal, GU, Neurologic, Endocrine, Skin, Musculoskeletal, was non contributory.  Past Medical History: Past Medical History:  Diagnosis Date  . Condyloma acuminata 04/28/2018  . Diabetic, retinopathy, proliferative (CMS/HHS-HCC) 09/16/2015  . ESRD on hemodialysis (CMS/HHS-HCC) 09/16/2015  . Hypercholesterolemia   . Hyperlipidemia   . Hyperparathyroidism due to renal insufficiency (CMS/HHS-HCC)    secondary anemia  . Hypertension   . Kidney stone    h/o previous left kidney stone with recurrence  . Uncontrolled type 2 diabetes mellitus with chronic kidney disease on chronic dialysis, with long-term current use of insulin  09/16/2015  . 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 VULVAR BIOPSY  04/28/2018  . SURVEILLANCE COLONOSCOPY N/A 06/11/2018   Procedure: SURVEILLANCE COLONOSCOPY, FLEXIBLE; DIAGNOSTIC, INCLUDING COLLECTION OF SPECIMEN(S) BY BRUSHING OR WASHING, WHEN PERFORMED (SEPARATE PROCEDURE);  Surgeon: Elta Fonda Mt, MD;  Location: [facility];  Service: Gastroenterology;  Laterality: N/A;  . VULVECTOMY N/A 07/30/2018   Procedure: VULVECTOMY SIMPLE; PARTIAL;  Surgeon: Caye Leita Gunner, MD;  Location: [facility];  Service: Gynecology;  Laterality: N/A;  . PELVIC EXAMINATION UNDER ANESTHESIA N/A 07/30/2018  Procedure: PELVIC EXAMINATION UNDER ANESTHESIA (OTHER THAN LOCAL);  Surgeon: Caye Leita Gunner, MD;  Location: [facility] OR;  Service: Gynecology;  Laterality: N/A;  . DESTRUCTION VULVA LESIONS N/A 07/30/2018   Procedure: DESTRUCTION OF LESION(S), VULVA; SIMPLE (CO2 LASER);  Surgeon: Caye Leita Gunner, MD;  Location: [facility] OR;  Service: Gynecology;   Laterality: N/A;  . DESTRUCTION ANAL LESIONS N/A 07/30/2018   Procedure: DESTRUCTION OF LESION(S), ANUS (EG, CONDYLOMA, PAPILLOMA, MOLLUSCUM CONTAGIOSUM, HERPETIC VESICLE), EXTENSIVE (CO2 SER SURGERY);  Surgeon: Florie Lonni Centers, MD;  Location: [facility] OR;  Service: General Surgery;  Laterality: N/A;  Excision of anal condyloma  . VULVECTOMY N/A 07/15/2019   Procedure: VULVECTOMY SIMPLE; PARTIAL;  Surgeon: Caye Leita Gunner, MD;  Location: [facility] OR;  Service: Gynecology;  Laterality: N/A;  . COLPOSCOPY VULVULAR N/A 07/15/2019   Procedure: COLPOSCOPY OF THE VULVA;  Surgeon: Caye Leita Gunner, MD;  Location: [facility];  Service: Gynecology;  Laterality: N/A;  Possible Biopsies  . DESTRUCTION VULVA LESIONS N/A 07/15/2019   Procedure: DESTRUCTION OF LESION(S), VULVA; SIMPLE (EG, LASER SURGERY, ELECTROSURGERY, CRYOSURGERY, CHEMOSURGERY);  Surgeon: Caye Leita Gunner, MD;  Location: [facility] OR;  Service: Gynecology;  Laterality: N/A;  . PELVIC EXAMINATION UNDER ANESTHESIA N/A 07/15/2019   Procedure: PELVIC EXAMINATION UNDER ANESTHESIA (OTHER THAN LOCAL);  Surgeon: Caye Leita Gunner, MD;  Location: [facility] OR;  Service: Gynecology;  Laterality: N/A;  . ARTERIOVENOUS GRAFT PLACEMENT Left    brachiocephalic  . CESAREAN SECTION    . CREATION ARTERIOVENOUS FISTULA Left    brachiocephalic vein transposition  . CYSTOURETHROSCOPY W/REMOVAL FOREIGN BODY/CALCULUS/URETERAL STENT N/A 06/25/2016   Procedure: CYSTOURETHROSCOPY, WITH REMOVAL OF FOREIGN BODY, CALCULUS, OR URETERAL STENT FROM URETHRA OR BLADDER (SEPARATE PROCEDURE); SIMPLE;  Surgeon: Carlin Berg Scales Mickey., MD;  Location: [facility];  Service: Urology;  Laterality: N/A;  transplant kidney  . EYE SURGERY Bilateral    laser surgery for retinal detachment  . LAPAROSCOPIC CHOLECYSTECTOMY  09/2014  . revision of arteriovenous goretex graft Left 2017  . TRANSPLANT KIDNEY N/A 04/28/2016   Procedure: RENAL  ALLOTRANSPLANTATION, IMPLANTATION OF GRAFT; WITHOUT RECIPIENT NEPHRECTOMY;  Surgeon: Delena Lane Barton, MD;  Location: [facility];  Service: General Surgery;  Laterality: N/A;    Family History: Family History  Problem Relation Name Age of Onset  . Cervical cancer Mother    . Stroke Maternal Grandmother    . Myocardial Infarction (Heart attack) Paternal Grandfather    . Anesthesia problems Neg Hx    . Malignant hyperthermia Neg Hx      Social History: Social History   Tobacco Use  . Smoking status: Never    Passive exposure: Never  . Smokeless tobacco: Never  Vaping Use  . Vaping status: Never Used  Substance Use Topics  . Alcohol use: No  . Drug use: No    Current Medications:  Current Outpatient Medications  Medication Sig Dispense Refill  . amLODIPine  (NORVASC ) 5 MG tablet Take 5 mg by mouth once daily    . aspirin  81 MG EC tablet Take 1 tablet (81 mg total) by mouth once daily. 120 tablet 5  . blood-glucose sensor (DEXCOM G7 SENSOR) Devi Use 1 each every 10 (ten) days 9 each 3  . diphenhydrAMINE  (BENADRYL ) 25 mg capsule Take 1 capsule (25 mg total) by mouth once daily as needed for Itching    . FARXIGA  10 mg tablet Take 10 mg by mouth once daily    . insulin  ASPART (NOVOLOG  FLEXPEN)  pen injector (concentration 100 units/mL) Inject 8 Units subcutaneously 3 (three) times daily with meals 8 mL 10  . insulin  DEGLUDEC (TRESIBA  FLEXTOUCH U-200) pen injector (concentration 200 units/mL) Inject 14 Units subcutaneously once daily 2.1 mL 11  . loratadine (CLARITIN) 10 mg capsule Take 10 mg by mouth once daily as needed    . multivitamin tablet Take 1 tablet by mouth once daily    . mycophenolate  (CELLCEPT ) 250 mg capsule Take 4 capsules (1,000 mg total) by mouth every 12 (twelve) hours 240 capsule 11  . pen needle, diabetic (BD ULTRA-FINE SHORT PEN NEEDLE) 31 gauge x 5/16 needle 4 (four) times daily 400 each 3  . predniSONE  (DELTASONE ) 5 MG tablet Take 1 tablet (5  mg total) by mouth once daily 30 tablet 11  . rosuvastatin  (CRESTOR ) 20 MG tablet Take 0.5 tablet (10 mg total) by mouth once daily 15 tablet 0  . sennosides-docusate (SENOKOT-S) 8.6-50 mg tablet Take 2 tablets by mouth 2 (two) times daily 120 tablet 0  . tacrolimus  (PROGRAF ) 1 MG capsule Take 4 capsules (4 mg total) by mouth once daily AND 3 capsules (3 mg total) at bedtime. 210 capsule 11  . tirzepatide (MOUNJARO) 7.5 mg/0.5 mL pen injector Inject 7.5 mg subcutaneously every Tuesday    . clopidogreL  (PLAVIX ) 75 mg tablet Take 75 mg by mouth once daily    . lancing device with lancets kit Three times daily 300 each 3  . pen needle, diabetic 31 gauge x 1/4 needle Use as directed Pt may use 4times daily plus if needed for coverage with sliding scale. 360 each 3   No current facility-administered medications for this visit.    Allergies: Allergies as of 10/17/2023 - Reviewed 10/17/2023  Allergen Reaction Noted  . Fluorescein Shortness Of Breath 05/12/2015  . Iodinated contrast media Anaphylaxis, Hives, and Swelling 01/15/2012  . Iohexol  Anaphylaxis, Hives, Itching, and Swelling 05/19/2006  . Latex, natural rubber Hives and Itching 06/10/2014  . Adhesive tape-silicones Itching and Rash 04/27/2016  . Latex Itching 05/08/2012  . Acetaminophen  Other (See Comments) 11/28/2016  . Egg Unknown 11/28/2016  . Lactose Nausea 11/28/2016  . Nsaids (non-steroidal anti-inflammatory drug) Unknown 11/28/2016  . Shellfish containing products Unknown 11/28/2016  . Sulfa (sulfonamide antibiotics) Itching 11/28/2016     Physical Examination: There were no vitals filed for this visit. Body mass index is 35.65 kg/m.  WDWN, NAD Breathing even and regular.   Lab Review:  Lab Results  Component Value Date   HGBA1C 10.6 (H) 09/17/2023   HGBA1C 8.2 (H) 04/17/2022   HGBA1C 8.2 04/17/2022      Impression/Plan: Morgan [name] is a 53 y.o. year-old female who presents for optimization of  diabetes mellitus s/p renal transplant, now s/p stroke in 2023 and multiple complications. Now with AKI and renal tx rejection.   Type II DM:  She is doing extremely well with her glucoses on much less insulin .  She should notify me if glucoses <70 so I can continue to adjust her dosing.  Could consider increasing her GLP1RA as tolerated and continue to downtitrate her insulin  but she is doing very well right now and has recently had several major medical events so we will hold off until she is more stable.   Discussed risk-benefit with proliferative retinopathy. She is stable from that perspective and will see her eye doctor again regularly.   We discussed for her to let me know if she is having any new eye changes.   She  is on an SGLT2 which has mixed data in late stage renal disease. Many experts would recommend that they continue down to the time of HD. She is on Farxiga  10 mg. Will defer to renal team about continuing or not.   Cardiovascular RR:  -Monitor BP  -Lipids improved, LDL 82.  -She is on rosuvastatin  Will trend lipids with next lab draw. Goal <55 due to secondary prevention ideally.    Continue use of dexcom.  I have advised patient to contact me for uncontrolled BG, concerning symptoms, or with any questions or concerns  Continued follow up with opthalmolology annually or PRN. Goes to [facility]. +Diabetic proliferative retinopathy. 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 Sees them next in August. From the MD Ophthalmology note: Ozempic-limited risk in her advanced retinopathy that is well treated and not active.    Diagnosis: Type 2 diabetes on insulin  therapy ICD10 Code: E11.9 Check fingerstick glucose: 4 x per day Reason for testing frequency: hyperglycemia, adjusts insulin  based on glucose result This frequency of testing is medically necessary. I have reviewed glucose meter download and/or logbook and glucose ranges: 51-401 Insulin   Regimen: multiple daily injections with basal and bolus insulin  therapy at least 4 times per day  Follow up-- 3 months   No orders of the defined types were placed in this encounter.  Requested Prescriptions   Pending Prescriptions Disp Refills  . rosuvastatin  (CRESTOR ) 20 MG tablet 15 tablet 0    Sig: Take 0.5 tablet (10 mg total) by mouth once daily   Signed Prescriptions Disp Refills  . tirzepatide (MOUNJARO) 7.5 mg/0.5 mL pen injector 6 mL 1    Sig: Inject 0.5 mLs (7.5 mg total) subcutaneously every Tuesday  . insulin  DEGLUDEC (TRESIBA  FLEXTOUCH U-200) pen injector (concentration 200 units/mL) 6 mL 3    Sig: Inject 14 Units subcutaneously 2 (two) times daily    This video encounter was conducted with the patient's (or proxy's) verbal consent via secure, interactive audio and video telecommunications while away from clinic/office/hospital.  The patient (or proxy) was instructed to have this encounter in a suitably private space and to only have persons present to whom they give permission to participate. In addition, patient identity was confirmed by use of name plus an additional identifier.  Nov 13, 2019 E&M) This visit was coded based on time. I spent a total of 30 minutes in both face-to-face and non-face-to-face activities for this visit on the date of this encounter.

## 2023-10-19 ENCOUNTER — Encounter (HOSPITAL_COMMUNITY): Payer: Self-pay

## 2023-10-19 ENCOUNTER — Other Ambulatory Visit: Payer: Self-pay

## 2023-10-19 ENCOUNTER — Observation Stay (HOSPITAL_COMMUNITY)
Admission: EM | Admit: 2023-10-19 | Discharge: 2023-10-20 | Disposition: A | Discharge status: Home / Self Care | Attending: Obstetrics and Gynecology | Admitting: Obstetrics and Gynecology

## 2023-10-19 ENCOUNTER — Emergency Department (HOSPITAL_COMMUNITY)

## 2023-10-19 DIAGNOSIS — J810 Acute pulmonary edema: Principal | ICD-10-CM | POA: Insufficient documentation

## 2023-10-19 DIAGNOSIS — N189 Chronic kidney disease, unspecified: Secondary | ICD-10-CM | POA: Diagnosis present

## 2023-10-19 DIAGNOSIS — Z794 Long term (current) use of insulin: Secondary | ICD-10-CM | POA: Insufficient documentation

## 2023-10-19 DIAGNOSIS — E1122 Type 2 diabetes mellitus with diabetic chronic kidney disease: Secondary | ICD-10-CM | POA: Diagnosis not present

## 2023-10-19 DIAGNOSIS — R0602 Shortness of breath: Secondary | ICD-10-CM | POA: Diagnosis present

## 2023-10-19 DIAGNOSIS — E8722 Chronic metabolic acidosis: Secondary | ICD-10-CM

## 2023-10-19 DIAGNOSIS — D849 Immunodeficiency, unspecified: Secondary | ICD-10-CM | POA: Diagnosis present

## 2023-10-19 DIAGNOSIS — I120 Hypertensive chronic kidney disease with stage 5 chronic kidney disease or end stage renal disease: Secondary | ICD-10-CM | POA: Insufficient documentation

## 2023-10-19 DIAGNOSIS — N184 Chronic kidney disease, stage 4 (severe): Secondary | ICD-10-CM | POA: Diagnosis present

## 2023-10-19 DIAGNOSIS — Z940 Kidney transplant status: Secondary | ICD-10-CM

## 2023-10-19 DIAGNOSIS — T8611 Kidney transplant rejection: Secondary | ICD-10-CM

## 2023-10-19 DIAGNOSIS — I160 Hypertensive urgency: Secondary | ICD-10-CM | POA: Diagnosis not present

## 2023-10-19 DIAGNOSIS — N186 End stage renal disease: Secondary | ICD-10-CM | POA: Insufficient documentation

## 2023-10-19 DIAGNOSIS — Z8673 Personal history of transient ischemic attack (TIA), and cerebral infarction without residual deficits: Secondary | ICD-10-CM | POA: Insufficient documentation

## 2023-10-19 DIAGNOSIS — Z992 Dependence on renal dialysis: Secondary | ICD-10-CM

## 2023-10-19 DIAGNOSIS — Z7901 Long term (current) use of anticoagulants: Secondary | ICD-10-CM | POA: Insufficient documentation

## 2023-10-19 DIAGNOSIS — I2510 Atherosclerotic heart disease of native coronary artery without angina pectoris: Secondary | ICD-10-CM | POA: Diagnosis not present

## 2023-10-19 DIAGNOSIS — E119 Type 2 diabetes mellitus without complications: Secondary | ICD-10-CM

## 2023-10-19 DIAGNOSIS — I10 Essential (primary) hypertension: Secondary | ICD-10-CM | POA: Diagnosis present

## 2023-10-19 LAB — COMPREHENSIVE METABOLIC PANEL WITH GFR
ALT: 25 U/L (ref 0–44)
AST: 20 U/L (ref 15–41)
Albumin: 2.6 g/dL — ABNORMAL LOW (ref 3.5–5.0)
Alkaline Phosphatase: 59 U/L (ref 38–126)
Anion gap: 13 (ref 5–15)
BUN: 61 mg/dL — ABNORMAL HIGH (ref 6–20)
CO2: 13 mmol/L — ABNORMAL LOW (ref 22–32)
Calcium: 9 mg/dL (ref 8.9–10.3)
Chloride: 115 mmol/L — ABNORMAL HIGH (ref 98–111)
Creatinine, Ser: 3.53 mg/dL — ABNORMAL HIGH (ref 0.44–1.00)
GFR, Estimated: 15 mL/min — ABNORMAL LOW (ref >60)
Glucose, Bld: 63 mg/dL — ABNORMAL LOW (ref 70–99)
Potassium: 4.3 mmol/L (ref 3.5–5.1)
Sodium: 141 mmol/L (ref 135–145)
Total Bilirubin: 0.6 mg/dL (ref 0.0–1.2)
Total Protein: 6 g/dL — ABNORMAL LOW (ref 6.5–8.1)

## 2023-10-19 LAB — CBC
HCT: 28.1 % — ABNORMAL LOW (ref 36.0–46.0)
Hemoglobin: 8.6 g/dL — ABNORMAL LOW (ref 12.0–15.0)
MCH: 27.7 pg (ref 26.0–34.0)
MCHC: 30.6 g/dL (ref 30.0–36.0)
MCV: 90.6 fL (ref 80.0–100.0)
Platelets: 379 K/uL (ref 150–400)
RBC: 3.1 MIL/uL — ABNORMAL LOW (ref 3.87–5.11)
RDW: 14.3 % (ref 11.5–15.5)
WBC: 7.1 K/uL (ref 4.0–10.5)
nRBC: 0 % (ref 0.0–0.2)

## 2023-10-19 LAB — I-STAT VENOUS BLOOD GAS, ED
Acid-base deficit: 6 mmol/L — ABNORMAL HIGH (ref 0.0–2.0)
Bicarbonate: 19 mmol/L — ABNORMAL LOW (ref 20.0–28.0)
Calcium, Ion: 1.3 mmol/L (ref 1.15–1.40)
HCT: 29 % — ABNORMAL LOW (ref 36.0–46.0)
Hemoglobin: 9.9 g/dL — ABNORMAL LOW (ref 12.0–15.0)
O2 Saturation: 83 %
Potassium: 4.4 mmol/L (ref 3.5–5.1)
Sodium: 142 mmol/L (ref 135–145)
TCO2: 20 mmol/L — ABNORMAL LOW (ref 22–32)
pCO2, Ven: 35.8 mmHg — ABNORMAL LOW (ref 44–60)
pH, Ven: 7.332 (ref 7.25–7.43)
pO2, Ven: 50 mmHg — ABNORMAL HIGH (ref 32–45)

## 2023-10-19 LAB — MAGNESIUM: Magnesium: 1.9 mg/dL (ref 1.7–2.4)

## 2023-10-19 LAB — TROPONIN I (HIGH SENSITIVITY)
Troponin I (High Sensitivity): 140 ng/L (ref <18)
Troponin I (High Sensitivity): 298 ng/L (ref <18)

## 2023-10-19 MED ORDER — NITROGLYCERIN 2 % TD OINT: 1.0000 [in_us] | TOPICAL_OINTMENT | Freq: Once | TRANSDERMAL | Status: DC

## 2023-10-19 MED ORDER — FUROSEMIDE 10 MG/ML IJ SOLN
40.0000 mg | Freq: Once | INTRAMUSCULAR | Status: AC
  Administered 2023-10-19: 40 mg via INTRAVENOUS
  Filled 2023-10-19: qty 4

## 2023-10-19 MED ORDER — NITROGLYCERIN 0.4 MG SL SUBL
SUBLINGUAL_TABLET | SUBLINGUAL | Status: AC
Start: 2023-10-19 — End: 2023-10-19
  Administered 2023-10-19: 0.4 mg
  Filled 2023-10-19: qty 1

## 2023-10-19 MED ORDER — NITROGLYCERIN IN D5W 200-5 MCG/ML-% IV SOLN: 0.0000 ug/min | INTRAVENOUS | Status: DC

## 2023-10-19 NOTE — Assessment & Plan Note (Addendum)
 10-19-2023 continue with bicarb 1300 mg twice daily.

## 2023-10-19 NOTE — H&P (Signed)
 History and Physical    Morgan [name] FMW:[id number] DOB: 04/24/1970 DOA: 10/19/2023  DOS: the patient was seen and examined on 10/19/2023  PCP: Orvis Glendia Ditch, MD   Patient coming from: Home  I have personally briefly reviewed patient's old medical records in [facility]  CC: SOB HPI: 53 year old African-American female history of end-stage renal disease status post renal transplant in 2018, essential hypertension, CKD stage IV, type 2 diabetes, prior history of stroke who presents to the hospital with a 1 day history of shortness of breath and 1 day history of worsening lower extremity edema.  Patient had recently admitted to the hospital on October 08, 2023 due to acute hypoxic respiratory failure.  She left AMA on 10/10/2023.  On arrival to the ER, patient was in respiratory extremis.  I discussed the case personally with the patient's initial ER provider.  She stated that she saw clear B-lines on her bedside ultrasound indicative of flash pulmonary edema.  Patient was reportedly having tripoding in respiratory distress.  Patient was placed on immediate BiPAP.  Her blood pressure was only 140-155.  She was given 1 dose of nitroglycerin  sublingual.  Patient's work of breathing improved.  She was given 40 mg of IV Lasix  without any urinary output.  Since she was taken off BiPAP, she has been on room air.  Initial VBG pH is 7.33 PCO2 of 35  White count 7.1, hemoglobin 8.6, platelets of 379  Magnesium 1.9  Sodium 141, potassium 4.3, bicarb of 13, BUN was 61, creatinine 3.53, glucose of 63  Total protein 6.0, albumin  2.6, AST 20, ALT 25, alk phos of 59, total bili 0.6  Initial troponin of 140, repeat still pending.  Portable chest x-ray showed left upper lobe/lingular airspace opacity  Patient states that she had a renal biopsy performed at [facility] in July.  Going through her chart in Care Everywhere, she did in fact have a biopsy performed July 22.  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  There is extensive microvascular inflammation.  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  There is interstitial fibrosis and severe tubular atrophy.  Nephrology be consulted in the morning to review the patient's pathology report.  Significant Events: Admitted 10/19/2023 for hypertensive urgency   Admission Labs: Initial VBG pH is 7.33 PCO2 of 35 White count 7.1, hemoglobin 8.6, platelets of 379 Magnesium 1.9 Sodium 141, potassium 4.3, bicarb of 13, BUN was 61, creatinine 3.53, glucose of 63 Total protein 6.0, albumin  2.6, AST 20, ALT 25, alk phos of 59, total bili 0.6 Initial troponin of 140, repeat still pending.  Admission Imaging Studies: Portable chest x-ray showed left upper lobe/lingular airspace opacity  Significant Labs:   Significant Imaging Studies:   Antibiotic Therapy: Anti-infectives (From admission, onward)    None       Procedures:   Consultants: nephrology    ED Course: placed immediately on bipap due to respiratory distress. Given 40 mg IV lasix  without any urinary output.  Review of Systems:  Review of Systems  Constitutional: Negative.  Negative for chills, fever and weight loss.  HENT: Negative.    Eyes: Negative.   Respiratory:  Positive for shortness of breath.   Cardiovascular:  Positive for leg swelling.  Gastrointestinal:  Positive for diarrhea.       Chronic diarrhea ever since increasing cellcept  to 1000 mg bid.  Genitourinary:  Negative for dysuria, frequency and urgency.  Musculoskeletal:  Negative for back pain, myalgias and neck pain.  Skin: Negative.   Neurological: Negative.   Endo/Heme/Allergies: Negative.   Psychiatric/Behavioral: Negative.    All other systems reviewed and are negative.   Past Medical History:  Diagnosis Date   Acute respiratory failure with hypoxia (HCC)  10/09/2023   Anemia    low iron   CKD (chronic kidney disease) stage V requiring chronic dialysis (HCC) 05/08/2012   Constipation    when given iron   Coronary artery disease    CVA (cerebral vascular accident) (HCC) 05/12/2021   Diabetes mellitus without complication (HCC)    type 2   Diabetic retinopathy (HCC)    ESRD on hemodialysis (HCC) 06/05/2014   H/O detached retina repair    bilateral   History of CVA (cerebrovascular accident)    Hyperlipidemia    Hypertension    Insulin  overdose 05/08/2012   Kidney transplanted    Pneumonia    PVD (peripheral vascular disease) (HCC)    S/P drug eluting coronary stent placement 08/24/2021   AT [facility] Dr Zachary Adam's   Suicide attempt [facility]) 05/08/2012    Past Surgical History:  Procedure Laterality Date   ARTERIOVENOUS GRAFT PLACEMENT Left 07/28/2010   AV FISTULA PLACEMENT Right 11/25/2015   Procedure: INSERTION OF ARTERIOVENOUS (AV) GORE-TEX GRAFT RIGHT  ARM USING 4-7 MM X 45 CM STRETCH GRAFT.;  Surgeon: Lonni GORMAN Blade, MD;  Location: [facility] OR;  Service: Vascular;  Laterality: Right;   basilic vein transposition Left 02/26/2009   CARDIAC CATHETERIZATION     CESAREAN SECTION     CHOLECYSTECTOMY N/A 10/08/2014   Procedure: LAPAROSCOPIC CHOLECYSTECTOMY ;  Surgeon: Debby Shipper, MD;  Location: [facility] OR;  Service: General;  Laterality: N/A;   EYE SURGERY     retinal detachment   KIDNEY TRANSPLANT  04/28/2016   REVISION OF ARTERIOVENOUS GORETEX GRAFT Left 05/13/2015   Procedure: REVISION OF ARTERIOVENOUS GORETEX GRAFT;  Surgeon: Lonni GORMAN Blade, MD;  Location: [facility] OR;  Service: Vascular;  Laterality: Left;     reports that she has never smoked. She has never used smokeless tobacco. She reports that she does not drink alcohol and does not use drugs.  Allergies  Allergen Reactions   Fluorescein Other (See Comments) and Shortness Of Breath    Hypertension  Other Reaction(s): Hypertension   Iodinated Contrast Media  Anaphylaxis, Hives and Other (See Comments)    Diffuse swelling   Iohexol  Anaphylaxis, Hives, Itching, Swelling and Other (See Comments)    13 HR PRE-MEDS REQUIRED   Latex Itching   Acetaminophen  Other (See Comments)    Other reaction(s): Other (See Comments)  CANNOT TAKE DUE TO KIDNEY  CANNOT TAKE DUE TO KIDNEY  CANNOT TAKE DUE TO KIDNEY  Other reaction(s): Other (See Comments)   CANNOT TAKE DUE TO KIDNEY   Egg Solids, Whole Other (See Comments)    CANNOT TAKE DUE TO AUTO IMMUNE DISORDER   Egg White (Egg Protein)     Other reaction(s): Other (See Comments)  CANNOT TAKE DUE TO AUTO IMMUNE DISORDER   CANNOT TAKE DUE TO AUTO IMMUNE DISORDER  Other reaction(s): Other (See Comments)   CANNOT TAKE DUE TO AUTO IMMUNE DISORDER   Nsaids Other (See Comments)    CANNOT TAKE DUE TO KIDNEY   CANNOT TAKE DUE TO KIDNEY   Shellfish Allergy Other (See Comments)    Other reaction(s): Other (See Comments)  CANNOT TAKE DUE TO KIDNEY TRANSPLANT   CANNOT TAKE DUE TO KIDNEY TRANSPLANT   Lactose Nausea  Only and Other (See Comments)    Other reaction(s): Other (See Comments)   Misc. Sulfonamide Containing Compounds Itching    Other reaction(s): Other (See Comments)   CANNOT TAKE DUE TO KIDNEY TRANSPLANT    Family History  Problem Relation Age of Onset   Diabetes Maternal Grandmother    Gout Maternal Uncle     Prior to Admission medications   Medication Sig Start Date End Date Taking? Authorizing Provider  amLODipine  (NORVASC ) 5 MG tablet Take 5 mg by mouth daily. 01/03/18   [provider]  aspirin  EC 81 MG tablet Take 81 mg by mouth daily. Swallow whole.    [provider]  b complex-vitamin c-folic acid  (NEPHRO-VITE) 0.8 MG TABS tablet Take 1 tablet by mouth daily.    [provider]  clopidogrel  (PLAVIX ) 75 MG tablet Take 75 mg by mouth daily.    [provider]  FARXIGA  10 MG TABS tablet Take 10 mg by mouth daily. 09/08/23   [provider]   Insulin  Aspart FlexPen (NOVOLOG ) 100 UNIT/ML Inject 8-13 Units into the skin 3 (three) times daily with meals. Inject per sliding scale:  70-200    8 units 201-250  9 units 251-300  10 units 301-350  11 units 351-400  12 units 401+       13 units 10/23/21   [provider]  insulin  degludec (TRESIBA ) 200 UNIT/ML FlexTouch Pen Inject 14 Units into the skin daily with lunch.    [provider]  levonorgestrel (MIRENA) 20 MCG/24HR IUD 1 each by Intrauterine route once.    [provider]  loratadine (CLARITIN) 10 MG tablet Take 10 mg by mouth daily as needed (seasonal allergies.).    [provider]  MOUNJARO 7.5 MG/0.5ML Pen Inject 7.5 mg into the skin once a week. Inject on Tuesday. 05/13/23   [provider]  mycophenolate  (CELLCEPT ) 250 MG capsule Take 500 mg by mouth in the morning and at bedtime. Take two capsules (500mg ) by mouth every 12 hours to prevent organ rejection.    [provider]  predniSONE  (DELTASONE ) 5 MG tablet Take 5 mg by mouth daily with breakfast.    [provider]  rosuvastatin  (CRESTOR ) 20 MG tablet Take 1 tablet (20 mg total) by mouth daily. Patient taking differently: Take 10 mg by mouth daily. Take 0.5 tablet (10mg ) by mouth once daily. 09/11/21 02/26/24  Elgergawy, Brayton RAMAN, MD  senna-docusate (SENOKOT-S) 8.6-50 MG tablet Take 2 tablets by mouth in the morning and at bedtime. 09/25/23   [provider]  sodium bicarbonate  650 MG tablet Take 1,300 mg by mouth in the morning and at bedtime. 07/04/20   [provider]  tacrolimus  (PROGRAF ) 1 MG capsule Take 3-4 mg by mouth in the morning and at bedtime. Take 4 capsules (4mg ) by mouth in the morning and 3 capsules (3mg ) at bedtime.    [provider]    Physical Exam: Vitals:   10/19/23 2130 10/19/23 2132 10/19/23 2230 10/19/23 2315  BP: (!) 117/59  (!) 104/55 (!) 105/46  Pulse: (!) 102  (!) 106   Resp: (!) 26  (!) 21 (!) 21   Temp:  98.4 F (36.9 C)    SpO2: 100%  96% 96%    Physical Exam Vitals and nursing note reviewed.  Constitutional:      General: She is not in acute distress.    Appearance: She is not toxic-appearing or diaphoretic.  HENT:     Head: Normocephalic  and atraumatic.     Nose: Nose normal.  Eyes:     General: No scleral icterus. Cardiovascular:     Rate and Rhythm: Regular rhythm. Tachycardia present.     Pulses: Normal pulses.  Pulmonary:     Effort: Pulmonary effort is normal. No respiratory distress.     Breath sounds: Normal breath sounds. No wheezing or rales.  Abdominal:     General: Bowel sounds are normal. There is no distension.     Palpations: Abdomen is soft.     Tenderness: There is no abdominal tenderness.  Musculoskeletal:     Comments: Trace pretibial edema bilateral lower legs. No pedal or ankle edema.  Skin:    General: Skin is warm and dry.     Capillary Refill: Capillary refill takes less than 2 seconds.  Neurological:     General: No focal deficit present.     Mental Status: She is alert and oriented to person, place, and time.      Labs on Admission: I have personally reviewed following labs and imaging studies  CBC: Recent Labs  Lab 10/19/23 1845 10/19/23 2056  WBC  --  7.1  HGB 9.9* 8.6*  HCT 29.0* 28.1*  MCV  --  90.6  PLT  --  379   Basic Metabolic Panel: Recent Labs  Lab 10/19/23 1845 10/19/23 2056  NA 142 141  K 4.4 4.3  CL  --  115*  CO2  --  13*  GLUCOSE  --  63*  BUN  --  61*  CREATININE  --  3.53*  CALCIUM   --  9.0  MG  --  1.9   GFR: Estimated Creatinine Clearance: 18.8 mL/min (A) (by C-G formula based on SCr of 3.53 mg/dL (H)). Liver Function Tests: Recent Labs  Lab 10/19/23 2056  AST 20  ALT 25  ALKPHOS 59  BILITOT 0.6  PROT 6.0*  ALBUMIN  2.6*   Cardiac Enzymes: Recent Labs  Lab 10/19/23 2056 10/19/23 2300  TROPONINIHS 140* 298*   BNP (last 3 results) Recent Labs    10/08/23 2159  BNP 497.0*    Radiological Exams on Admission: I have personally reviewed images DG Chest Portable 1 View Result Date: 10/19/2023 CLINICAL DATA:  sob SOB Per triage notes:Pt here form home with c/o sob times 1 day , kidney transplant pt , sats 86% on room air , hr 125 EXAM: PORTABLE CHEST 1 VIEW COMPARISON:  Chest x-ray 10/10/2023 FINDINGS: The heart and mediastinal contours are within normal limits. Developing patchy airspace opacity of the left lower lung zone/lingula. No pulmonary edema. No pleural effusion. No pneumothorax. No acute osseous abnormality.  Left arm vascular stents. IMPRESSION: Developing patchy airspace opacity of the left lower lung zone/lingula. Followup PA and lateral chest X-ray is recommended in 3-4 weeks following therapy to ensure resolution and exclude underlying malignancy. Electronically Signed   By: Morgane  Naveau M.D.   On: 10/19/2023 19:10   EKG: My personal interpretation of EKG shows: sinus tachycardia    Assessment/Plan Principal Problem:   Hypertensive urgency Active Problems:   Renal transplant rejection   Chronic metabolic acidosis   Diabetes mellitus, type 2 (HCC)   CKD (chronic kidney disease) stage 4, GFR 15-29 ml/min (HCC)   Essential (primary) hypertension   Immunosuppression (HCC)   S/p cadaver renal transplant CKD stage 4   Assessment and Plan: * Hypertensive urgency 10-19-2023 appears to have had acute hypertensive urgency with reported pulmonary edema on bedside thoracic ultrasound. Now resolved after  SL NTG and bipap. Pt is currently on RA and off bipap. Monitor her overnight. Continue home BP meds.  Renal transplant rejection 10-19-2023 patient states that she is currently on CellCept  1000 mg twice daily, Prograf  4 mg twice daily, prednisone  5 mg daily.  She states that she had a recent office visit with her local nephrologist Dr. Tobie.  She states that he did not have access to her pathology report from [facility].  I am able to access this report.  It  shows that she probably is having transplant rejection.  Will consult nephrology in the morning to see if she stable to be discharged to home.  She states that she has an appointment to see [facility] transplant on October 22, 2023.  I have included a copy of her report and this assessment and plan.  A. 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  Microvascular inflammation, extensive. See comment. 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 See comment. Focal global glomerulosclerosis (~30%). Interstitial fibrosis/tubular atrophy, severe.    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  Correlation with laboratory testing for donor-specific antibodies (in progress) is recommended.  The diabetic nephropathy is classified according to the Renal Pathology Society system (Tervaert et al., J Am Soc Nephrol, [phone]).  Preliminary findings were discussed with Dr. Goni Katz-Greenberg at 15:10 on 09/19/23.     Chronic metabolic acidosis 10-19-2023 continue with bicarb 1300 mg twice daily.  S/p cadaver renal transplant CKD stage 4 10-19-2023 patient reports that she has a transplant clinic appointment on October 22, 2023.  Will consult nephrology in the morning to make sure that her transplant meds do not need to be adjusted prior to discharge.  Immunosuppression (HCC) 10-19-2023 patient reportedly on CellCept  1000 mg twice daily, Prograf  4 mg twice daily, prednisone  5 mg daily  Essential (primary) hypertension 10-19-2023 Patient only on Norvasc  5 mg daily.  She states that her blood pressure normally runs low.  CKD (chronic kidney disease) stage 4, GFR 15-29 ml/min (HCC) 10-19-2023 baseline kidney function  appears improved and now about 3.5.  When she had her biopsy done in July 2024, her creatinine was about 4.5.  Nephrology will be consulted in the morning.  Diabetes mellitus, type 2 (HCC) 10-19-2023 patient states that she is on Tresiba  15 units twice a day along with sliding scale insulin .  She was also told to start Farxiga  several weeks ago.  She has been taking this.  She states that her endocrinologist told her that this may damage her kidney.  I think her Farxiga  is more likely being used as a mild diuretic in a patient with severe CKD and already has a renal transplant.  Will see if nephrology can comment on the use of Farxiga  as well.   DVT prophylaxis: SQ Heparin  Code Status: Full Code Family Communication: no family at bedside. She is decisional  Disposition Plan: return home  Consults called: nephrology  Admission status: Observation, Telemetry bed   Camellia Door, DO [facility] 10/19/2023, 11:56 PM

## 2023-10-19 NOTE — Assessment & Plan Note (Addendum)
 10-19-2023 baseline kidney function appears improved and now about 3.5.  When she had her biopsy done in July 2024, her creatinine was about 4.5.  Nephrology will be consulted in the morning.

## 2023-10-19 NOTE — ED Provider Notes (Signed)
 [facility] Provider Note   CSN: [id number] Arrival date & time: 10/19/23  1751     Patient presents with: Shortness of Breath   Morgan [name] is a 53 y.o. female past medical history significant for hypertension, type 2 diabetes, PAD, history of CVA, ESRD s/p renal transplant who presents emergency department for shortness of breath.  Patient states that she had acute onset shortness of breath this a.m. but that has now improved and has continuously worsened.  Patient states that she is compliant with her home medications including diuretics.  She patient denies associated chest pain, productive cough, fever or lower extremity worsening swelling.  History is difficult to obtain secondary to respiratory distress    Shortness of Breath      Prior to Admission medications   Medication Sig Start Date End Date Taking? Authorizing Provider  amLODipine  (NORVASC ) 5 MG tablet Take 5 mg by mouth daily. 01/03/18   [provider]  aspirin  EC 81 MG tablet Take 81 mg by mouth daily. Swallow whole.    [provider]  b complex-vitamin c-folic acid  (NEPHRO-VITE) 0.8 MG TABS tablet Take 1 tablet by mouth daily.    [provider]  clopidogrel  (PLAVIX ) 75 MG tablet Take 75 mg by mouth daily.    [provider]  FARXIGA  10 MG TABS tablet Take 10 mg by mouth daily. 09/08/23   [provider]  Insulin  Aspart FlexPen (NOVOLOG ) 100 UNIT/ML Inject 8-13 Units into the skin 3 (three) times daily with meals. Inject per sliding scale:  70-200    8 units 201-250  9 units 251-300  10 units 301-350  11 units 351-400  12 units 401+       13 units 10/23/21   [provider]  insulin  degludec (TRESIBA ) 200 UNIT/ML FlexTouch Pen Inject 14 Units into the skin daily with lunch.    [provider]  levonorgestrel (MIRENA) 20 MCG/24HR IUD 1 each by Intrauterine route once.    [provider]   loratadine (CLARITIN) 10 MG tablet Take 10 mg by mouth daily as needed (seasonal allergies.).    [provider]  MOUNJARO 7.5 MG/0.5ML Pen Inject 7.5 mg into the skin once a week. Inject on Tuesday. 05/13/23   [provider]  mycophenolate  (CELLCEPT ) 250 MG capsule Take 500 mg by mouth in the morning and at bedtime. Take two capsules (500mg ) by mouth every 12 hours to prevent organ rejection.    [provider]  predniSONE  (DELTASONE ) 5 MG tablet Take 5 mg by mouth daily with breakfast.    [provider]  rosuvastatin  (CRESTOR ) 20 MG tablet Take 1 tablet (20 mg total) by mouth daily. Patient taking differently: Take 10 mg by mouth daily. Take 0.5 tablet (10mg ) by mouth once daily. 09/11/21 02/26/24  Elgergawy, Brayton RAMAN, MD  senna-docusate (SENOKOT-S) 8.6-50 MG tablet Take 2 tablets by mouth in the morning and at bedtime. 09/25/23   [provider]  sodium bicarbonate  650 MG tablet Take 1,300 mg by mouth in the morning and at bedtime. 07/04/20   [provider]  tacrolimus  (PROGRAF ) 1 MG capsule Take 3-4 mg by mouth in the morning and at bedtime. Take 4 capsules (4mg ) by mouth in the morning and 3 capsules (3mg ) at bedtime.    [provider]    Allergies: Fluorescein; Iodinated contrast media; Iohexol ; Latex; Acetaminophen ; Egg solids, whole; Egg white (egg protein); Nsaids; Shellfish allergy; Lactose; and Misc. sulfonamide  containing compounds    Review of Systems  Respiratory:  Positive for shortness of breath.     Updated Vital Signs BP (!) 105/46   Pulse (!) 106   Temp 98.4 F (36.9 C)   Resp (!) 21   SpO2 96%   Physical Exam Vitals reviewed.  Constitutional:      General: She is in acute distress.     Appearance: She is ill-appearing.     Comments: Acute respiratory distress Chronically ill-appearing  HENT:     Head: Normocephalic.  Neck:     Vascular: No JVD.     Trachea: Trachea normal.  Cardiovascular:      Rate and Rhythm: Tachycardia present.     Pulses:          Radial pulses are 2+ on the right side and 2+ on the left side.  Pulmonary:     Effort: Tachypnea and respiratory distress present.     Breath sounds: Decreased air movement present. Examination of the right-lower field reveals rales. Examination of the left-lower field reveals rales. Rales present.  Abdominal:     General: There is no distension.     Palpations: Abdomen is soft.     Tenderness: There is no abdominal tenderness.  Musculoskeletal:     Comments: Spontaneous movement of bilateral upper and lower extremities  Skin:    Findings: No rash.  Neurological:     Mental Status: She is alert.     (all labs ordered are listed, but only abnormal results are displayed) Labs Reviewed  CBC - Abnormal; Notable for the following components:      Result Value   RBC 3.10 (*)    Hemoglobin 8.6 (*)    HCT 28.1 (*)    All other components within normal limits  COMPREHENSIVE METABOLIC PANEL WITH GFR - Abnormal; Notable for the following components:   Chloride 115 (*)    CO2 13 (*)    Glucose, Bld 63 (*)    BUN 61 (*)    Creatinine, Ser 3.53 (*)    Total Protein 6.0 (*)    Albumin  2.6 (*)    GFR, Estimated 15 (*)    All other components within normal limits  I-STAT VENOUS BLOOD GAS, ED - Abnormal; Notable for the following components:   pCO2, Ven 35.8 (*)    pO2, Ven 50 (*)    Bicarbonate 19.0 (*)    TCO2 20 (*)    Acid-base deficit 6.0 (*)    HCT 29.0 (*)    Hemoglobin 9.9 (*)    All other components within normal limits  TROPONIN I (HIGH SENSITIVITY) - Abnormal; Notable for the following components:   Troponin I (High Sensitivity) 140 (*)    All other components within normal limits  MAGNESIUM  BRAIN NATRIURETIC PEPTIDE  TROPONIN I (HIGH SENSITIVITY)    EKG: EKG Interpretation Date/Time:  Saturday October 19 2023 18:24:35 EDT Ventricular Rate:  134 PR Interval:  134 QRS Duration:  88 QT Interval:  288 QTC  Calculation: 430 R Axis:   -1  Text Interpretation: Sinus tachycardia Repol abnrm suggests ischemia, lateral leads SINCE LAST TRACING HEART RATE HAS INCREASED Confirmed by Lenor Hollering [phone]) on 10/19/2023 10:37:06 PM  Radiology: [facility] Chest Portable 1 View Result Date: 10/19/2023 CLINICAL DATA:  sob SOB Per triage notes:Pt here form home with c/o sob times 1 day , kidney transplant pt , sats 86% on room air , hr 125 EXAM: PORTABLE CHEST 1 VIEW COMPARISON:  Chest x-ray 10/10/2023 FINDINGS: The heart and mediastinal contours are within normal limits. Developing patchy airspace opacity of the left lower lung zone/lingula. No pulmonary edema. No pleural effusion. No pneumothorax. No acute osseous abnormality.  Left arm vascular stents. IMPRESSION: Developing patchy airspace opacity of the left lower lung zone/lingula. Followup PA and lateral chest X-ray is recommended in 3-4 weeks following therapy to ensure resolution and exclude underlying malignancy. Electronically Signed   By: Morgane  Naveau M.D.   On: 10/19/2023 19:10     Procedures   Medications Ordered in the ED  nitroGLYCERIN  (NITROSTAT ) 0.4 MG SL tablet (0.4 mg  Given 10/19/23 1830)  furosemide  (LASIX ) injection 40 mg (40 mg Intravenous Given 10/19/23 2112-11-21)    Clinical Course as of 10/19/23 11/22/2334  Sat Oct 19, 2023  2130 EKG reviewed by me: Sinus tachycardia with normal axis and intervals.  ST depressions present in inferolateral leads that were present in prior EKGs however more pronounced today [AG]  2221/11/21 Hospitalist consult placed [AG]    Clinical Course User Index [AG] Nada Chroman, DO                                 Medical Decision Making Amount and/or Complexity of Data Reviewed Labs: ordered. Radiology: ordered.  Risk Prescription drug management. Decision regarding hospitalization.   On initial evaluation the patient is tachycardic, hypertensive and in respiratory distress in the tripod position.  On physical  examination patient has bibasilar rales, decreased breath sounds and bedside ultrasound with diffuse B-lines suggestive of pulmonary edema.  As patient is hypertensive, will give sublingual nitroglycerin  and placed patient on BiPAP with concern for acute pulmonary edema/ SCAPE (sympathetic crashing acute pulmonary edema).  Differential diagnosis includes acute pulmonary edema, ACS/MI, pneumothorax, pneumonia, pneumothorax, acute renal failure.  Will obtain laboratory studies, chest x-ray and EKG.  Patient does have evidence of diffuse ST depressions however denies chest pain and on evaluation of laboratory studies troponin is elevated however is improved from prior hospitalization therefore do not believe patient's presentation is cardiac in nature however troponins are elevated secondary to demand ischemia.  Chest x-ray reviewed with evidence of acute pulmonary edema and possible full consolidation however there is no evidence of pneumothorax.  As patient is afebrile without leukocytosis and does not have productive cough do not believe antibiotics are necessary at this time as patient has etiology for respiratory distress including acute pulmonary edema.  On reevaluation of the patient SBPs ranging from 140-155 therefore do not believe nitroglycerin  paste or drip is necessary at this time.  Patient remained on BiPAP for work of breathing and hypoxia.  Laboratory studies reviewed without evidence of metabolic acidosis, acute renal failure as creatinine is at baseline and no evidence of significant electrolyte abnormalities.  Patient is anemic however has only had 1 point drop from CBC previous lab studies therefore do not believe acute invention is necessary at this time.  On reevaluation of the patient she has had improvement in her work of breathing and states she feels much better.  As she has improvement in hemodynamics will trial off of BiPAP.  Will admit patient to the hospital for continued care and  management in the setting of acute hypoxic respiratory failure and acute pulmonary edema.     Final diagnoses:  Acute pulmonary edema [facility])    ED Discharge Orders     None       Chroman Nada DO  Emergency Medicine PGY2    Nada Chroman, DO 10/19/23 [id number]    Lenor Hollering, MD 10/19/23 [phone]

## 2023-10-19 NOTE — ED Notes (Signed)
 Pt medicated. Requested that her kidney doctor doctor Tobie at [facility] be informed of her admission to the hospital

## 2023-10-19 NOTE — Assessment & Plan Note (Signed)
 10-19-2023 appears to have had acute hypertensive urgency with reported pulmonary edema on bedside thoracic ultrasound. Now resolved after SL NTG and bipap. Pt is currently on RA and off bipap. Monitor her overnight. Continue home BP meds.

## 2023-10-19 NOTE — ED Triage Notes (Signed)
 Pt here form home with c/o sob times 1 day , kidney transplant pt , sats 86% on room air , hr 125

## 2023-10-19 NOTE — ED Notes (Signed)
 Pt is on bipap at this time. Rn told pt that due to difficulty getting IV access that the IV team was going to come and get access on pt so she can be given medication.

## 2023-10-19 NOTE — Assessment & Plan Note (Addendum)
 10-19-2023 patient reportedly on CellCept  1000 mg twice daily, Prograf  4 mg twice daily, prednisone  5 mg daily

## 2023-10-19 NOTE — Assessment & Plan Note (Addendum)
 10-19-2023 Patient only on Norvasc  5 mg daily.  She states that her blood pressure normally runs low.

## 2023-10-19 NOTE — Hospital Course (Addendum)
 CC: SOB HPI: 53 year old African-American female history of end-stage renal disease status post renal transplant in 2018, essential hypertension, CKD stage IV, type 2 diabetes, prior history of stroke who presents to the hospital with a 1 day history of shortness of breath and 1 day history of worsening lower extremity edema.  Patient had recently admitted to the hospital on October 08, 2023 due to acute hypoxic respiratory failure.  She left AMA on 10/10/2023.  On arrival to the ER, patient was in respiratory extremis.  I discussed the case personally with the patient's initial ER provider.  She stated that she saw clear B-lines on her bedside ultrasound indicative of flash pulmonary edema.  Patient was reportedly having tripoding in respiratory distress.  Patient was placed on immediate BiPAP.  Her blood pressure was only 140-155.  She was given 1 dose of nitroglycerin  sublingual.  Patient's work of breathing improved.  She was given 40 mg of IV Lasix  without any urinary output.  Since she was taken off BiPAP, she has been on room air.  Initial VBG pH is 7.33 PCO2 of 35  White count 7.1, hemoglobin 8.6, platelets of 379  Magnesium 1.9  Sodium 141, potassium 4.3, bicarb of 13, BUN was 61, creatinine 3.53, glucose of 63  Total protein 6.0, albumin  2.6, AST 20, ALT 25, alk phos of 59, total bili 0.6  Initial troponin of 140, repeat still pending.  Portable chest x-ray showed left upper lobe/lingular airspace opacity  Patient states that she had a renal biopsy performed at [facility] in July.  Going through her chart in Care Everywhere, she did in fact have a biopsy performed July 22.  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  There is extensive microvascular inflammation.  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  There is interstitial fibrosis and severe tubular atrophy.  Nephrology be  consulted in the morning to review the patient's pathology report.  Significant Events: Admitted 10/19/2023 for hypertensive urgency   Admission Labs: Initial VBG pH is 7.33 PCO2 of 35 White count 7.1, hemoglobin 8.6, platelets of 379 Magnesium 1.9 Sodium 141, potassium 4.3, bicarb of 13, BUN was 61, creatinine 3.53, glucose of 63 Total protein 6.0, albumin  2.6, AST 20, ALT 25, alk phos of 59, total bili 0.6 Initial troponin of 140, repeat still pending.  Admission Imaging Studies: Portable chest x-ray showed left upper lobe/lingular airspace opacity  Significant Labs:   Significant Imaging Studies:   Antibiotic Therapy: Anti-infectives (From admission, onward)    None       Procedures:   Consultants: nephrology

## 2023-10-19 NOTE — Progress Notes (Signed)
 Pt taken off of BiPAP, and tolerating well at this time. VSS RT will follow up if needed.

## 2023-10-19 NOTE — Assessment & Plan Note (Addendum)
 10-19-2023 patient states that she is currently on CellCept  1000 mg twice daily, Prograf  4 mg twice daily, prednisone  5 mg daily.  She states that she had a recent office visit with her local nephrologist Dr. Tobie.  She states that he did not have access to her pathology report from [facility].  I am able to access this report.  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  Will consult nephrology in the morning to see if she stable to be discharged to home.  She states that she has an appointment to see [facility] transplant on October 22, 2023.  I have included a copy of her report and this assessment and plan.  A. 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  Microvascular inflammation, extensive. See comment. 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 See comment. Focal global glomerulosclerosis (~30%). Interstitial fibrosis/tubular atrophy, severe.    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  Correlation with laboratory testing for donor-specific antibodies (in progress) is recommended.  The diabetic nephropathy is classified according to the Renal Pathology Society system (Tervaert et al., J Am Soc Nephrol, [phone]).  Preliminary findings were discussed with Dr. Goni Katz-Greenberg at 15:10 on 09/19/23.

## 2023-10-19 NOTE — Assessment & Plan Note (Addendum)
 10-19-2023 patient reports that she has a transplant clinic appointment on October 22, 2023.  Will consult nephrology in the morning to make sure that her transplant meds do not need to be adjusted prior to discharge.

## 2023-10-19 NOTE — Assessment & Plan Note (Addendum)
 10-19-2023 patient states that she is on Tresiba  15 units twice a day along with sliding scale insulin .  She was also told to start Farxiga  several weeks ago.  She has been taking this.  She states that her endocrinologist told her that this may damage her kidney.  I think her Farxiga  is more likely being used as a mild diuretic in a patient with severe CKD and already has a renal transplant.  Will see if nephrology can comment on the use of Farxiga  as well.

## 2023-10-20 DIAGNOSIS — I160 Hypertensive urgency: Secondary | ICD-10-CM | POA: Diagnosis not present

## 2023-10-20 LAB — CBC WITH DIFFERENTIAL/PLATELET
Abs Immature Granulocytes: 0.02 K/uL (ref 0.00–0.07)
Basophils Absolute: 0 K/uL (ref 0.0–0.1)
Basophils Relative: 0 %
Eosinophils Absolute: 0 K/uL (ref 0.0–0.5)
Eosinophils Relative: 0 %
HCT: 28.4 % — ABNORMAL LOW (ref 36.0–46.0)
Hemoglobin: 8.9 g/dL — ABNORMAL LOW (ref 12.0–15.0)
Immature Granulocytes: 0 %
Lymphocytes Relative: 19 %
Lymphs Abs: 1.1 K/uL (ref 0.7–4.0)
MCH: 27.6 pg (ref 26.0–34.0)
MCHC: 31.3 g/dL (ref 30.0–36.0)
MCV: 87.9 fL (ref 80.0–100.0)
Monocytes Absolute: 0.7 K/uL (ref 0.1–1.0)
Monocytes Relative: 12 %
Neutro Abs: 4.1 K/uL (ref 1.7–7.7)
Neutrophils Relative %: 69 %
Platelets: 542 K/uL — ABNORMAL HIGH (ref 150–400)
RBC: 3.23 MIL/uL — ABNORMAL LOW (ref 3.87–5.11)
RDW: 14.4 % (ref 11.5–15.5)
WBC: 6 K/uL (ref 4.0–10.5)
nRBC: 0 % (ref 0.0–0.2)

## 2023-10-20 LAB — BASIC METABOLIC PANEL WITH GFR
Anion gap: 9 (ref 5–15)
BUN: 62 mg/dL — ABNORMAL HIGH (ref 6–20)
CO2: 19 mmol/L — ABNORMAL LOW (ref 22–32)
Calcium: 9.7 mg/dL (ref 8.9–10.3)
Chloride: 112 mmol/L — ABNORMAL HIGH (ref 98–111)
Creatinine, Ser: 3.88 mg/dL — ABNORMAL HIGH (ref 0.44–1.00)
GFR, Estimated: 13 mL/min — ABNORMAL LOW (ref >60)
Glucose, Bld: 159 mg/dL — ABNORMAL HIGH (ref 70–99)
Potassium: 4 mmol/L (ref 3.5–5.1)
Sodium: 140 mmol/L (ref 135–145)

## 2023-10-20 LAB — HEMOGLOBIN A1C
Hgb A1c MFr Bld: 8.6 % — ABNORMAL HIGH (ref 4.8–5.6)
Mean Plasma Glucose: 200.12 mg/dL

## 2023-10-20 LAB — TROPONIN I (HIGH SENSITIVITY): Troponin I (High Sensitivity): 327 ng/L (ref <18)

## 2023-10-20 LAB — BRAIN NATRIURETIC PEPTIDE: B Natriuretic Peptide: 849.7 pg/mL — ABNORMAL HIGH (ref 0.0–100.0)

## 2023-10-20 MED ORDER — INSULIN GLARGINE 100 UNIT/ML ~~LOC~~ SOLN
15.0000 [IU] | Freq: Every day | SUBCUTANEOUS | Status: DC
  Filled 2023-10-20: qty 0.15

## 2023-10-20 MED ORDER — CLOPIDOGREL BISULFATE 75 MG PO TABS
75.0000 mg | ORAL_TABLET | Freq: Every day | ORAL | Status: DC
  Administered 2023-10-20: 75 mg via ORAL
  Filled 2023-10-20: qty 1

## 2023-10-20 MED ORDER — INSULIN ASPART 100 UNIT/ML IJ SOLN: 0.0000 [IU] | Freq: Three times a day (TID) | INTRAMUSCULAR | Status: DC

## 2023-10-20 MED ORDER — INSULIN GLARGINE-YFGN 100 UNIT/ML ~~LOC~~ SOLN: 15.0000 [IU] | Freq: Every day | SUBCUTANEOUS | Status: DC

## 2023-10-20 MED ORDER — SODIUM BICARBONATE 650 MG PO TABS
1300.0000 mg | ORAL_TABLET | Freq: Two times a day (BID) | ORAL | Status: DC
  Administered 2023-10-20: 1300 mg via ORAL
  Filled 2023-10-20: qty 2

## 2023-10-20 MED ORDER — MYCOPHENOLATE MOFETIL 250 MG PO CAPS
1000.0000 mg | ORAL_CAPSULE | Freq: Two times a day (BID) | ORAL | Status: DC
  Administered 2023-10-20: 1000 mg via ORAL
  Filled 2023-10-20 (×2): qty 4

## 2023-10-20 MED ORDER — TACROLIMUS 1 MG PO CAPS
4.0000 mg | ORAL_CAPSULE | Freq: Every day | ORAL | Status: DC
  Administered 2023-10-20: 4 mg via ORAL

## 2023-10-20 MED ORDER — MYCOPHENOLATE MOFETIL 250 MG PO CAPS
1000.0000 mg | ORAL_CAPSULE | Freq: Once | ORAL | Status: DC
  Filled 2023-10-20: qty 4

## 2023-10-20 MED ORDER — TACROLIMUS 1 MG PO CAPS
3.0000 mg | ORAL_CAPSULE | Freq: Once | ORAL | Status: DC
  Filled 2023-10-20: qty 3

## 2023-10-20 MED ORDER — INSULIN ASPART 100 UNIT/ML IJ SOLN: 0.0000 [IU] | Freq: Every day | INTRAMUSCULAR | Status: DC

## 2023-10-20 MED ORDER — DAPAGLIFLOZIN PROPANEDIOL 10 MG PO TABS
10.0000 mg | ORAL_TABLET | Freq: Every day | ORAL | Status: DC
  Administered 2023-10-20: 10 mg via ORAL
  Filled 2023-10-20: qty 1

## 2023-10-20 MED ORDER — ROSUVASTATIN CALCIUM 5 MG PO TABS
10.0000 mg | ORAL_TABLET | Freq: Every day | ORAL | Status: DC
  Administered 2023-10-20: 10 mg via ORAL
  Filled 2023-10-20: qty 2

## 2023-10-20 MED ORDER — PREDNISONE 5 MG PO TABS
5.0000 mg | ORAL_TABLET | Freq: Every day | ORAL | Status: DC
  Administered 2023-10-20: 5 mg via ORAL
  Filled 2023-10-20: qty 1

## 2023-10-20 MED ORDER — INSULIN ASPART 100 UNIT/ML IJ SOLN
8.0000 [IU] | Freq: Once | INTRAMUSCULAR | Status: AC
  Administered 2023-10-20: 8 [IU] via SUBCUTANEOUS

## 2023-10-20 MED ORDER — ALBUTEROL SULFATE (2.5 MG/3ML) 0.083% IN NEBU: 2.5000 mg | INHALATION_SOLUTION | RESPIRATORY_TRACT | Status: DC | PRN

## 2023-10-20 MED ORDER — FUROSEMIDE 10 MG/ML IJ SOLN: 20.0000 mg | Freq: Once | INTRAMUSCULAR | Status: DC

## 2023-10-20 MED ORDER — ONDANSETRON HCL 4 MG/2ML IJ SOLN: 4.0000 mg | Freq: Four times a day (QID) | INTRAMUSCULAR | Status: DC | PRN

## 2023-10-20 MED ORDER — ASPIRIN 81 MG PO TBEC
81.0000 mg | DELAYED_RELEASE_TABLET | Freq: Every day | ORAL | Status: DC
Start: 2023-10-20 — End: 2023-10-20
  Administered 2023-10-20: 81 mg via ORAL
  Filled 2023-10-20: qty 1

## 2023-10-20 MED ORDER — ONDANSETRON HCL 4 MG PO TABS: 4.0000 mg | ORAL_TABLET | Freq: Four times a day (QID) | ORAL | Status: DC | PRN

## 2023-10-20 MED ORDER — HEPARIN SODIUM (PORCINE) 5000 UNIT/ML IJ SOLN
5000.0000 [IU] | Freq: Three times a day (TID) | INTRAMUSCULAR | Status: DC
  Administered 2023-10-20 (×2): 5000 [IU] via SUBCUTANEOUS
  Filled 2023-10-20 (×2): qty 1

## 2023-10-20 MED ORDER — TACROLIMUS 1 MG PO CAPS: 3.0000 mg | ORAL_CAPSULE | Freq: Every day | ORAL | Status: DC

## 2023-10-20 NOTE — Plan of Care (Signed)
 Problem: Education: Goal: Knowledge of General Education information will improve Description: Including pain rating scale, medication(s)/side effects and non-pharmacologic comfort measures 10/20/2023 1528 by Javier Gondola, RN Outcome: Adequate for Discharge 10/20/2023 1450 by Javier Gondola, RN Outcome: Progressing   Problem: Health Behavior/Discharge Planning: Goal: Ability to manage health-related needs will improve 10/20/2023 1528 by Javier Gondola, RN Outcome: Adequate for Discharge 10/20/2023 1450 by Javier Gondola, RN Outcome: Progressing   Problem: Clinical Measurements: Goal: Ability to maintain clinical measurements within normal limits will improve 10/20/2023 1528 by Javier Gondola, RN Outcome: Adequate for Discharge 10/20/2023 1450 by Javier Gondola, RN Outcome: Progressing Goal: Will remain free from infection 10/20/2023 1528 by Javier Gondola, RN Outcome: Adequate for Discharge 10/20/2023 1450 by Javier Gondola, RN Outcome: Progressing Goal: Diagnostic test results will improve 10/20/2023 1528 by Javier Gondola, RN Outcome: Adequate for Discharge 10/20/2023 1450 by Javier Gondola, RN Outcome: Progressing Goal: Respiratory complications will improve 10/20/2023 1528 by Javier Gondola, RN Outcome: Adequate for Discharge 10/20/2023 1450 by Javier Gondola, RN Outcome: Progressing Goal: Cardiovascular complication will be avoided 10/20/2023 1528 by Javier Gondola, RN Outcome: Adequate for Discharge 10/20/2023 1450 by Javier Gondola, RN Outcome: Progressing   Problem: Activity: Goal: Risk for activity intolerance will decrease 10/20/2023 1528 by Javier Gondola, RN Outcome: Adequate for Discharge 10/20/2023 1450 by Javier Gondola, RN Outcome: Progressing   Problem: Nutrition: Goal: Adequate nutrition will be maintained 10/20/2023 1528 by Javier Gondola, RN Outcome: Adequate for Discharge 10/20/2023 1450 by Javier Gondola, RN Outcome: Progressing    Problem: Coping: Goal: Level of anxiety will decrease 10/20/2023 1528 by Javier Gondola, RN Outcome: Adequate for Discharge 10/20/2023 1450 by Javier Gondola, RN Outcome: Progressing   Problem: Elimination: Goal: Will not experience complications related to bowel motility 10/20/2023 1528 by Javier Gondola, RN Outcome: Adequate for Discharge 10/20/2023 1450 by Javier Gondola, RN Outcome: Progressing Goal: Will not experience complications related to urinary retention 10/20/2023 1528 by Javier Gondola, RN Outcome: Adequate for Discharge 10/20/2023 1450 by Javier Gondola, RN Outcome: Progressing   Problem: Pain Managment: Goal: General experience of comfort will improve and/or be controlled 10/20/2023 1528 by Javier Gondola, RN Outcome: Adequate for Discharge 10/20/2023 1450 by Javier Gondola, RN Outcome: Progressing   Problem: Skin Integrity: Goal: Risk for impaired skin integrity will decrease 10/20/2023 1528 by Javier Gondola, RN Outcome: Adequate for Discharge 10/20/2023 1450 by Javier Gondola, RN Outcome: Progressing   Problem: Safety: Goal: Ability to remain free from injury will improve 10/20/2023 1528 by Javier Gondola, RN Outcome: Adequate for Discharge 10/20/2023 1450 by Javier Gondola, RN Outcome: Progressing   Problem: Education: Goal: Ability to describe self-care measures that may prevent or decrease complications (Diabetes Survival Skills Education) will improve 10/20/2023 1528 by Javier Gondola, RN Outcome: Adequate for Discharge 10/20/2023 1450 by Javier Gondola, RN Outcome: Progressing Goal: Individualized Educational Video(s) 10/20/2023 1528 by Javier Gondola, RN Outcome: Adequate for Discharge 10/20/2023 1450 by Javier Gondola, RN Outcome: Progressing   Problem: Coping: Goal: Ability to adjust to condition or change in health will improve 10/20/2023 1528 by Javier Gondola, RN Outcome: Adequate for Discharge 10/20/2023 1450 by Javier Gondola,  RN Outcome: Progressing   Problem: Health Behavior/Discharge Planning: Goal: Ability to identify and utilize available resources and services will improve 10/20/2023 1528 by Javier Gondola, RN Outcome: Adequate for Discharge 10/20/2023 1450 by Javier Gondola, RN Outcome: Progressing Goal: Ability to manage health-related needs will improve 10/20/2023 1528 by Javier Gondola, RN Outcome: Adequate for Discharge 10/20/2023 1450 by Javier Gondola, RN Outcome: Progressing  Problem: Fluid Volume: Goal: Ability to maintain a balanced intake and output will improve 10/20/2023 1528 by Javier Gondola, RN Outcome: Adequate for Discharge 10/20/2023 1450 by Javier Gondola, RN Outcome: Progressing

## 2023-10-20 NOTE — Plan of Care (Signed)

## 2023-10-20 NOTE — Progress Notes (Signed)
 Patient self administered 10 units of short acting and 15 units of long acting insulin  from her personal medication.   She was advised of the hospital policy and the need for medication to be stored at the pharmacy or taken home.   Patient is agreeable to sending medication home with her Husband.

## 2023-10-20 NOTE — Progress Notes (Signed)
   10/20/23 1529  AVS Discharge Documentation  AVS Discharge Instructions Including Medications Provided to patient/caregiver  Name of Person Receiving AVS Discharge Instructions Including Medications Morgan [name]  Name of Clinician That Reviewed AVS Discharge Instructions Including Medications Alfonse RN    AVS reviewed with patient, all personal belongings were returned. All questions were answered.

## 2023-10-20 NOTE — Discharge Summary (Signed)
 Morgan [name] FMW:[id number] DOB: Jul 14, 1970 DOA: 10/19/2023  PCP: Orvis Glendia Ditch, MD  Admit date: 10/19/2023 Discharge date: 10/20/2023  Time spent: 35 minutes  Recommendations for Outpatient Follow-up:  Nephrology f/u 2 days as scheduled. Consider initiation of lasix  Pcp and cardiology f/u     Discharge Diagnoses:  Principal Problem:   Hypertensive urgency Active Problems:   Renal transplant rejection   Chronic metabolic acidosis   Diabetes mellitus, type 2 (HCC)   CKD (chronic kidney disease) stage 4, GFR 15-29 ml/min (HCC)   Essential (primary) hypertension   Immunosuppression (HCC)   S/p cadaver renal transplant CKD stage 4   Discharge Condition: stable  Diet recommendation: heart healthy  Filed Weights   10/20/23 0200  Weight: 90.5 kg    History of present illness:  From admission h and p 53 year old African-American female history of end-stage renal disease status post renal transplant in 2018, essential hypertension, CKD stage IV, type 2 diabetes, prior history of stroke who presents to the hospital with a 1 day history of shortness of breath and 1 day history of worsening lower extremity edema.   Patient had recently admitted to the hospital on October 08, 2023 due to acute hypoxic respiratory failure.  She left AMA on 10/10/2023.   On arrival to the ER, patient was in respiratory extremis.  I discussed the case personally with the patient's initial ER provider.  She stated that she saw clear B-lines on her bedside ultrasound indicative of flash pulmonary edema.  Patient was reportedly having tripoding in respiratory distress.  Patient was placed on immediate BiPAP.  Her blood pressure was only 140-155.  She was given 1 dose of nitroglycerin  sublingual.  Patient's work of breathing improved.  She was given 40 mg of IV Lasix  without any urinary output.  Since she was taken off BiPAP, she has been on room air.   Initial VBG pH is 7.33 PCO2 of 35    White count 7.1, hemoglobin 8.6, platelets of 379   Magnesium 1.9   Sodium 141, potassium 4.3, bicarb of 13, BUN was 61, creatinine 3.53, glucose of 63   Total protein 6.0, albumin  2.6, AST 20, ALT 25, alk phos of 59, total bili 0.6   Initial troponin of 140, repeat still pending.   Portable chest x-ray showed left upper lobe/lingular airspace opacity   Patient states that she had a renal biopsy performed at [facility] in July.  Going through her chart in Care Everywhere, she did in fact have a biopsy performed July 22.  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  There is extensive microvascular inflammation.  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  There is interstitial fibrosis and severe tubular atrophy.   Nephrology be consulted in the morning to review the patient's pathology report.  Hospital Course:  Patient presents with  respiratory distress, hypertensive emergency. Likely flash pulmonary edema. Treated with bipap and lasix  and nitroglycerine. By hospital day 1 symptoms completely resolved, breathing comfortably and satting normally on room air. Patient has ckd 4/5 with history of kidney transplant, recent biopsy shows possible rejection. Has f/u with her nephrologist in 2 days. Our nephrologist reviewed things and advised no changes to current meds and no additional inpatient w/u. Patient also has a history of CAD with several stents. Is followed by [facility] and had an abnormal lexiscan  earlier this year. Cath has been deferred while her worsening CKD is evaluated. Here troponins elevated  but patient without chest pain. Echo from 8/14 showing preserved EF, no RWMAs. As patient is symptom free, deferred additional cardiac w/u, instead advised close outpatient f/u with her cardiology team. Consider initiation of loop diuretic at nephrology f/u.  Procedures: none    Consultations: none  Discharge Exam: Vitals:   10/20/23 0957 10/20/23 1147  BP: (!) 149/57   Pulse:    Resp:    Temp:    SpO2:  98%    General: NAD Cardiovascular: RRR, soft systolic murmu Respiratory: CTAB save for bibasilar rales  Discharge Instructions   Discharge Instructions     Diet - low sodium heart healthy   Complete by: As directed    Increase activity slowly   Complete by: As directed       Allergies as of 10/20/2023       Reactions   Fluorescite [fluorescein] Shortness Of Breath, Hypertension   Iodinated Contrast Media Anaphylaxis, Hives, Other (See Comments)   Diffuse swelling   Iohexol  Anaphylaxis, Hives, Itching, Swelling, Other (See Comments)   12 hour pre-meds required    Latex Itching   Egg Solids, Whole Other (See Comments)   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   Nsaids Other (See Comments)   Hx kidney transplant   Shellfish Allergy Other (See Comments)   Hx kidney transplant - told not to consume   Tylenol  [acetaminophen ] Other (See Comments)   Patient states she can not take due to kidney   Lactose Nausea Only   Misc. Sulfonamide Containing Compounds Itching, Other (See Comments)   Hx kidney transplant        Medication List     PAUSE taking these medications    Farxiga  10 MG Tabs tablet Wait to take this until your doctor or other care provider tells you to start again. Generic drug: dapagliflozin  propanediol Take 10 mg by mouth daily.       TAKE these medications    amLODipine  5 MG tablet Commonly known as: NORVASC  Take 5 mg by mouth daily.   aspirin  EC 81 MG tablet Take 81 mg by mouth daily. Swallow whole.   clopidogrel  75 MG tablet Commonly known as: PLAVIX  Take 75 mg by mouth daily.   Insulin  Aspart FlexPen 100 UNIT/ML Commonly known as: NOVOLOG  Inject 8-13 Units into the skin 3 (three) times daily with meals. Inject per sliding scale:  70-200    8  units 201-250  9 units 251-300  10 units 301-350  11 units 351-400  12 units 401+       13 units   insulin  degludec 200 UNIT/ML FlexTouch Pen Commonly known as: TRESIBA  Inject 15 Units into the skin 2 (two) times daily with a meal.   levonorgestrel 20 MCG/24HR IUD Commonly known as: MIRENA 1 each by Intrauterine route once.   Mounjaro 7.5 MG/0.5ML Pen Generic drug: tirzepatide Inject 7.5 mg into the skin every Tuesday.   mycophenolate  250 MG capsule Commonly known as: CELLCEPT  Take 1,000 mg by mouth every 12 (twelve) hours.   predniSONE  5 MG tablet Commonly known as: DELTASONE  Take 5 mg by mouth daily.   rosuvastatin  20 MG tablet Commonly known as: CRESTOR  Take 1 tablet (20 mg total) by mouth daily.   senna-docusate 8.6-50 MG tablet Commonly known as: Senokot-S Take 2 tablets by mouth in the morning and at bedtime.   sodium bicarbonate  650 MG tablet Take 1,300 mg by mouth in the morning and at bedtime.  tacrolimus  1 MG capsule Commonly known as: PROGRAF  Take 3-4 mg by mouth in the morning and at bedtime. Take 4 capsules (4mg ) by mouth in the morning and 3 capsules (3mg ) in the evening.               Durable Medical Equipment  (From admission, onward)           Start     Ordered   10/20/23 1039  DME Glucometer  Once       Comments: Patient can use home DME Dexcom.   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   Nsaids Other (See Comments)    Hx kidney transplant   Shellfish Allergy Other (See Comments)    Hx kidney transplant - told  not to consume   Tylenol  [Acetaminophen ] Other (See Comments)    Patient states she can not take due to kidney   Lactose Nausea Only   Misc. Sulfonamide Containing Compounds Itching and Other (See Comments)    Hx kidney transplant    Follow-up Information     Sanoff, Glendia Ditch, MD Follow up.   Specialty: Internal Medicine Contact information: [address] [facility] [place] [address] [phone]         your nephrologist Follow up.   Why: in 2 days as scheduled        your [facility] cardiologist Follow up.                   The results of significant diagnostics from this hospitalization (including imaging, microbiology, ancillary and laboratory) are listed below for reference.    Significant Diagnostic Studies: DG Chest Portable 1 View Result Date: 10/19/2023 CLINICAL DATA:  sob SOB Per triage notes:Pt here form home with c/o sob times 1 day , kidney transplant pt , sats 86% on room air , hr 125 EXAM: PORTABLE CHEST 1 VIEW COMPARISON:  Chest x-ray 10/10/2023 FINDINGS: The heart and mediastinal contours are within normal limits. Developing patchy airspace opacity of the left lower lung zone/lingula. No pulmonary edema. No pleural effusion. No pneumothorax. No acute osseous abnormality.  Left arm vascular stents. IMPRESSION: Developing patchy airspace opacity of the left lower lung zone/lingula. Followup PA and lateral chest X-ray is recommended in 3-4 weeks following therapy to ensure resolution and exclude underlying malignancy. Electronically Signed   By: Morgane  Naveau M.D.   On: 10/19/2023 19:10   ECHOCARDIOGRAM COMPLETE Result Date: 10/10/2023    ECHOCARDIOGRAM REPORT   Patient Name:   Lydiann Bonifas Tpoanlmwz-Xpmab Date of Exam: 10/10/2023 Medical Rec #:  [id number]                Height:       62.0 in Accession #:    [id number]               Weight:       194.9 lb Date of Birth:  10-Feb-1971                BSA:  1.891 m Patient Age:    52 years                  BP:           97/55 mmHg Patient Gender: F                        HR:           96 bpm. Exam Location:  Inpatient Procedure: 2D Echo, Cardiac Doppler and Color Doppler (Both Spectral and Color            Flow Doppler were utilized during procedure). Indications:    Elevated Troponin  History:        Patient has prior history of Echocardiogram examinations, most                 recent 09/09/2021. CAD; Risk Factors:Hypertension and Diabetes.  Sonographer:    Jayson Gaskins Referring Phys: ZACKERY SIRA IMPRESSIONS  1. Left ventricular ejection fraction, by estimation, is 50 to 55%. The left ventricle has low normal function. The left ventricle has no regional wall motion abnormalities. Left ventricular diastolic parameters are consistent with Grade II diastolic dysfunction (pseudonormalization).  2. Right ventricular systolic function is normal. The right ventricular size is normal. Tricuspid regurgitation signal is inadequate for assessing PA pressure.  3. The mitral valve is grossly normal. Mild mitral valve regurgitation. No evidence of mitral stenosis.  4. The aortic valve is tricuspid. Aortic valve regurgitation is mild. No aortic stenosis is present.  5. The inferior vena cava is normal in size with greater than 50% respiratory variability, suggesting right atrial pressure of 3 mmHg. FINDINGS  Left Ventricle: Left ventricular ejection fraction, by estimation, is 50 to 55%. The left ventricle has low normal function. The left ventricle has no regional wall motion abnormalities. The left ventricular internal cavity size was normal in size. There is no left ventricular hypertrophy. Left ventricular diastolic parameters are consistent with Grade II diastolic dysfunction (pseudonormalization).  LV Wall Scoring: The posterior wall is hypokinetic. Right Ventricle: The right ventricular size is normal. No increase in right ventricular wall thickness. Right ventricular systolic function is normal. Tricuspid regurgitation  signal is inadequate for assessing PA pressure. Left Atrium: Left atrial size was normal in size. Right Atrium: Right atrial size was normal in size. Pericardium: There is no evidence of pericardial effusion. Mitral Valve: The mitral valve is grossly normal. Mild mitral annular calcification. Mild mitral valve regurgitation. No evidence of mitral valve stenosis. Tricuspid Valve: The tricuspid valve is grossly normal. Tricuspid valve regurgitation is trivial. Aortic Valve: The aortic valve is tricuspid. Aortic valve regurgitation is mild. No aortic stenosis is present. Aortic valve mean gradient measures 3.0 mmHg. Aortic valve peak gradient measures 5.0 mmHg. Aortic valve area, by VTI measures 1.87 cm. Pulmonic Valve: The pulmonic valve was grossly normal. Pulmonic valve regurgitation is trivial. No evidence of pulmonic stenosis. Aorta: The aortic root and ascending aorta are structurally normal, with no evidence of dilitation. Venous: The inferior vena cava is normal in size with greater than 50% respiratory variability, suggesting right atrial pressure of 3 mmHg. IAS/Shunts: The atrial septum is grossly normal.  LEFT VENTRICLE PLAX 2D LVIDd:         4.00 cm   Diastology LVIDs:         2.60 cm   LV e' medial:    7.62 cm/s LV PW:         1.00  cm   LV E/e' medial:  17.5 LV IVS:        1.00 cm   LV e' lateral:   5.55 cm/s LVOT diam:     1.70 cm   LV E/e' lateral: 24.0 LV SV:         43 LV SV Index:   23 LVOT Area:     2.27 cm  RIGHT VENTRICLE RV S prime:     10.00 cm/s TAPSE (M-mode): 2.6 cm LEFT ATRIUM             Index        RIGHT ATRIUM           Index LA Vol (A2C):   45.2 ml 23.91 ml/m  RA Area:     13.80 cm LA Vol (A4C):   37.4 ml 19.78 ml/m  RA Volume:   35.20 ml  18.62 ml/m LA Biplane Vol: 43.2 ml 22.85 ml/m  AORTIC VALVE AV Area (Vmax):    1.70 cm AV Area (Vmean):   1.87 cm AV Area (VTI):     1.87 cm AV Vmax:           112.00 cm/s AV Vmean:          80.400 cm/s AV VTI:            0.228 m AV Peak  Grad:      5.0 mmHg AV Mean Grad:      3.0 mmHg LVOT Vmax:         83.80 cm/s LVOT Vmean:        66.200 cm/s LVOT VTI:          0.188 m LVOT/AV VTI ratio: 0.82  AORTA Ao Root diam: 2.10 cm MITRAL VALVE MV Area (PHT): 5.38 cm     SHUNTS MV Decel Time: 141 msec     Systemic VTI:  0.19 m MV E velocity: 133.00 cm/s  Systemic Diam: 1.70 cm MV A velocity: 84.40 cm/s MV E/A ratio:  1.58 Darryle Decent MD Electronically signed by Darryle Decent MD Signature Date/Time: 10/10/2023/1:49:48 PM    Final    DG Chest 2 View Result Date: 10/10/2023 CLINICAL DATA:  [id number] Hypoxia [id number] EXAM: CHEST - 2 VIEW COMPARISON:  October 08, 2023 FINDINGS: No focal airspace consolidation, pleural effusion, or pneumothorax. No cardiomegaly.No acute fracture or destructive lesion. Vascular stents in the left upper extremity/axilla. Multilevel thoracic osteophytosis. Cholecystectomy clips. IMPRESSION: No acute cardiopulmonary abnormality. Electronically Signed   By: Rogelia Myers M.D.   On: 10/10/2023 11:28   IR US  CHEST Result Date: 10/10/2023 CLINICAL DATA:  Patient presented to IR today for possible thoracentesis. EXAM: CHEST ULTRASOUND COMPARISON:  None Available. FINDINGS: Limited ultrasound examination of the left and right lung fields revealed no pleural fluid. No procedure performed. IMPRESSION: No will effusion noted in either the left or right lung fields. Ultrasound images obtained by Warren Dais, NP Electronically Signed   By: Ester Sides M.D.   On: 10/10/2023 11:28   DG Chest Portable 1 View Result Date: 10/08/2023 CLINICAL DATA:  Shortness of breath EXAM: PORTABLE CHEST 1 VIEW COMPARISON:  Chest x-ray 10/06/2014 FINDINGS: There are hazy opacities overlying the left costophrenic angle likely related to pleural effusion. There are minimal patchy opacities in the lung bases. Lung apices are clear. There is no evidence for pneumothorax. The cardiomediastinal silhouette is within normal limits. There are vascular stents in  the left axillary region. IMPRESSION: 1. Hazy opacities overlying the left costophrenic angle likely  related to pleural effusion. 2. Minimal patchy opacities in the lung bases may represent atelectasis or infection. Electronically Signed   By: Greig Pique M.D.   On: 10/08/2023 23:21    Microbiology: No results found for this or any previous visit (from the past 240 hours).   Labs: Basic Metabolic Panel: Recent Labs  Lab 10/19/23 1845 10/19/23 2056 10/20/23 0908  NA 142 141 140  K 4.4 4.3 4.0  CL  --  115* 112*  CO2  --  13* 19*  GLUCOSE  --  63* 159*  BUN  --  61* 62*  CREATININE  --  3.53* 3.88*  CALCIUM   --  9.0 9.7  MG  --  1.9  --    Liver Function Tests: Recent Labs  Lab 10/19/23 2056  AST 20  ALT 25  ALKPHOS 59  BILITOT 0.6  PROT 6.0*  ALBUMIN  2.6*   No results for input(s): LIPASE, AMYLASE in the last 168 hours. No results for input(s): AMMONIA in the last 168 hours. CBC: Recent Labs  Lab 10/19/23 1845 10/19/23 2056 10/20/23 0908  WBC  --  7.1 6.0  NEUTROABS  --   --  4.1  HGB 9.9* 8.6* 8.9*  HCT 29.0* 28.1* 28.4*  MCV  --  90.6 87.9  PLT  --  379 542*   Cardiac Enzymes: No results for input(s): CKTOTAL, CKMB, CKMBINDEX, TROPONINI in the last 168 hours. BNP: BNP (last 3 results) Recent Labs    10/08/23 2159 10/20/23 0908  BNP 497.0* 849.7*    ProBNP (last 3 results) No results for input(s): PROBNP in the last 8760 hours.  CBG: No results for input(s): GLUCAP in the last 168 hours.     Signed:  Devaughn KATHEE Ban MD.  [facility] 10/20/2023, 3:20 PM

## 2023-10-20 NOTE — Progress Notes (Addendum)
 Patient reports a 8 units with every meal fast acting- Insulin  and with an additional 1 unit/coverage for every 50 above 150- blood glucose levels.    Patient is requesting 11 units this morning. Primary team informed.

## 2023-10-20 NOTE — Progress Notes (Signed)
 Patient has Dexcom, and is refusing CBG.

## 2023-10-20 NOTE — Progress Notes (Signed)
 Dexcom reading  118. No lunch time insulin  administered.

## 2023-10-20 NOTE — Progress Notes (Signed)
   10/20/23 0957  Vitals  BP (!) 149/57  MAP (mmHg) 95  BP Location Right Wrist  BP Method Automatic (home machine)  ECG Heart Rate 95  MEWS Score  MEWS Temp 0  MEWS Systolic 0  MEWS Pulse 0  MEWS RR 0  MEWS LOC 0  MEWS Score 0  MEWS Score Color Green     Patient reported measurements, used home machine.

## 2023-10-20 NOTE — Progress Notes (Signed)
   10/20/23 0200  Assess: MEWS Score  Temp 99.1 F (37.3 C)  BP (!) 99/57  MAP (mmHg) 71  Pulse Rate (!) 102  ECG Heart Rate (!) 102  Resp 20  Level of Consciousness Alert  SpO2 100 %  O2 Device Room Air  Assess: MEWS Score  MEWS Temp 0  MEWS Systolic 1  MEWS Pulse 1  MEWS RR 0  MEWS LOC 0  MEWS Score 2  MEWS Score Color Yellow  Assess: if the MEWS score is Yellow or Red  Were vital signs accurate and taken at a resting state? Yes  Does the patient meet 2 or more of the SIRS criteria? No  MEWS guidelines implemented  Yes, yellow  Treat  MEWS Interventions Considered administering scheduled or prn medications/treatments as ordered  Take Vital Signs  Increase Vital Sign Frequency  Yellow: Q2hr x1, continue Q4hrs until patient remains green for 12hrs  Escalate  MEWS: Escalate Yellow: Discuss with charge nurse and consider notifying provider and/or RRT  Notify: Charge Nurse/RN  Name of Charge Nurse/RN Notified [place], RN  Assess: SIRS CRITERIA  SIRS Temperature  0  SIRS Respirations  0  SIRS Pulse 1  SIRS WBC 0  SIRS Score Sum  1

## 2023-10-20 NOTE — Progress Notes (Signed)
 Dexcom reading 86, patient is requesting 8 units of Humalog . Primary team informed.

## 2023-10-20 NOTE — ED Notes (Signed)
 Attempted to call floor regarding patient being transported up stairs, no answer with both attempts.

## 2023-10-29 NOTE — H&P (View-Only) (Signed)
 [facility] ESTABLISHED PATIENT VISIT  Patient Name:  Morgan [name] 1970-08-07 53 y.o. Date of Encounter: October 29, 2023   PCP:  Orvis Glendia Ditch, MD Primary Cardiologist: Zachary CROME. Myra, MD   Reason for Visit: PAD, ESRD s/p renal transplant 04/2016, type II diabetes mellitus, CVA and SOB; Evaluate cardiovascular issues; Last office visit   09/11/2023  History of Present Illness: Morgan [name] is a very pleasant 53 y.o. female with a history of peripheral arterial disease, end stage renal disease previously on dialysis (s/p renal transplant 04/28/2016) whose creatinine has recently started to rise, type II diabetes mellitus, CVA (March '23,  small remote lacunar infarct of the posterior limb of the right internal capsule) and hypertension returning today for evaluation.   She has been experiencing exertional dyspnea and CP with a more recent positive stress test, LHC today due to rising SCr.  After last office visit, she was admitted 09/17/2023 - 09/25/2023 after a planned renal biopsy with hyperglycemia 370 and hypotension. She was given steroids with continued SCr elevation to 4. Chronic renal changes noted on biopsy. Outpatient HD follow up planned. She was then admitted a couple of weeks later with acute SOB started on BiPAP, CXR showing L sided pleural effusion s/p thoracentesis 10/09/2023 and started on antibiotic therapy for suspected bilateral pneumonia.  Troponin was elevated with 2D echocardiogram showing LVEF 50-55%, grade 2 DD, mild MR/AR. Close outpatient cardiology F/U was planned.   Today, she continues to experience exertional dyspnea and chest tightness with more significant activity. She has had conversations with her nephrology team regarding planned catheterization and the recommendation that she proceed to ensure her coronary disease is not affecting her renal function. No frank syncope or acute lateralizing neurological complaints.  Her weight is  down. No LE edema.   No tobacco use.  Blood pressures are well-controlled.  Review of Systems: 12-point review of systems otherwise negative with pertinent positives in the HPI.  Past Medical History: Past Medical History[1]  Surgical History: Past Surgical History[2]  Family History: Family History[3]  Social History: Tobacco Use History[4] Social History   Substance and Sexual Activity  Alcohol Use No   Social History   Substance and Sexual Activity  Drug Use No       Occupational History  . Not on file    Medications: Medications Ordered Prior to Encounter[5]  Allergies: Allergies[6]  Physical Exam: Blood pressure 142/56, pulse 89, height 157.5 cm (5' 2), weight 88.1 kg (194 lb 4.8 oz).   Constitutional/Gen:  Alert and oriented x3.  Well-developed, well-nourished, in no acute distress. Eyes:  Lids are normal without ptosis or edema.  Conjunctivae are normal and without inflammation, injection, hemorrhages or exudates. Ears/Nose/Mouth/Throat: Normal appearing. Oropharynx clear without lesion. Dentition good.   Neck:  No JVD.  No cervical or supraclavicular lymphadenopathy.  No thyromegaly. Trachea midline. Cardiovascular:  Normal S1 and S2, regular rate and rhythm.  No murmurs, rubs or gallops.  PMI not displaced.  No thrills, lifts or heaves.   Carotid pulses:  2+ bilaterally with faint bilateral bruits, likely radiating from her graft.   Abdominal aorta:  No abnormalities, no bruit auscultated.   Extremities:  Warm, dry.  Radial pulses palpable but slightly diminished.  Hands are warm and appear to be well-perfused. B/L fistulas. No wounds.  Lower extremities without clubbing, cyanosis or edema.  Respiratory:  Normal respiratory effort with symmetrical lung expansion.  CTAB.  GI:  Normal bowel sounds.  Soft, non-tender, non-distended.  No obvious masses. Musculoskeletal:  Muscle strength and tone normal.  No atrophy noted. Neuro:  Grossly intact, nonfocal.   Moves all 4 extremities equally/appropriately.  Skin: Skin is warm, moist, soft, elastic and without rash or lesions. Psychiatric:  Mood and affect appropriate. Hematologic/Lymphatic/Immunologic:  No signs of bleeding or excessive bruising.   Accessory clinical findings: 2D echocardiogram 12/13/2021 normal LV systolic function, ejection fraction greater than 55%, no significant valvular pathology, normal RV size/function; 2D echocardiogram 05/01/2023 showing normal LV systolic function and no significant valvular pathology, some mild MS.    ABI Doppler bilateral lower extremities 01/31/2022 left proximal popliteal artery stenosis (30 to 49%), possible right distal peroneal artery occlusion.  ABIs nondiagnostic due to incompressible ankle vessels.  TBI right 0.78 and left 0.72; absolute great toe pressures right 107, left 99 mmHg; arterial flow detected by PPG within all 10 digits.    Nuclear stress test 05/08/2023 showing an anterior lateral perfusion abnormality consistent with ischemia, normal EF.   Carotid ultrasounds 05/01/2023 showing <50% Stenosis in the bilateral ICAs.  Wt Readings from Last 6 Encounters:  10/29/23 88.1 kg (194 lb 4.8 oz)  09/11/23 88.5 kg (195 lb)  06/05/23 91.4 kg (201 lb 9.6 oz)  05/01/23 90.9 kg (200 lb 8 oz)  03/13/23 93.2 kg (205 lb 6.4 oz)  06/29/22 94.8 kg (209 lb)   Most recent labs on file:  Lab Results  Component Value Date   A1C 8.2 04/17/2022   WBC 7.7 11/07/2021   HGB 14.4 11/07/2021   HCT 44.0 11/07/2021   PLT 337 11/07/2021   BUN 33 (H) 11/07/2021   CREATININE 1.65 (H) 11/07/2021   K 4.8 11/07/2021    ASSESSMENT/PLAN:  1.  CVA x2:  Mild residual right sided paresthesias/numbness.  No acute symptoms. - 05/12/21 Right-sided paresthesias were primary defect. Eval at [facility]. MRI brain showing left thalamic small infarct secondary likely to small vessel disease source. CT head no acute abnormality, old right PLIC infarct; MRA head and neck  right P1 and P2, mid BA, distal left M1 and M2, bilateral ACAs moderate to severe stenosis. 2D Echo 05/13/21 EF 55 to 60%,No atrial level shunt detected by color flow Doppler. HK of the basal inferolateral wall with overall mild LV dysfunction. LDL was 76; HgbA1c 10.2;  - 09/08/2021 Acute onset of Right sided paresthesias; MRI at [facility] did show 2 punctate acute infarcts in the right external capsule and right periventricular frontal lobe. Occurred in setting of poorly controlled BP/Glucose.  -Plavix  which was added back in March 23 was changed to Brilinta  90 mg twice daily(since discontinued).  Rosuvastatin  was continued at 20 mg daily. - Zio patch x2 weeks April 2023 sinus rhythm with average heart rate 89 bpm; SR range 67-123 bpm; 1 isolated 6 beat run of NSVT.  Rare skipped beats.  No A-fib/flutter.  - Right upper extremity arterial duplex 07/25/2021 no evidence of hemodynamically significant PAD.  Monophasic and biphasic flow noted throughout the right upper extremity consistent with patent AV graft. - Focus on aggressive control of modifiable CVD risk factors.  - She is seeing neurology locally as well.   2.  CAD, chest tightness/exertional shortness of breath, abnormal ST and recent trop bump in setting of CHF/AKI Proceed with LHC. Risks/benefit discussed. Advised 3-4 hours hydration prior for renal protection. Monitor for acute CHF.  - Lexiscan  Cardiolite  07/27/2021 inferior lateral perfusion abnormality consistent with ischemia, moderate in size and severity, EF >55%.  - Left heart catheterization 08/24/2021 s/p  stenting of the left Cx 99% to <10% with a 2.75x26mm Xience DES to 2.28mm, PTA of the D2 80% to <20% with a 2.25x8 balloon, stenting of the LAD 80% to <10% with a 2.75x80mm Xience DES.  - Continue medical management with aspirin  81 mg daily, rosuvastatin  20 mg daily. Consider BB if BP tolerates.  - 2D echocardiogram 05/01/2023 showing normal LV systolic function and mild MS. - Lexiscan   Cardiolite  05/08/2023 showing anterior lateral perfusion abnormality consistent with ischemia. - Continue aspirin  and statin.   3.  Peripheral arterial disease, upper extremities: No typical claudication symptoms reported today. - 05/31/2014 s/p PTA/stenting of left axillary 80% stenosis to less than 20% with a 6 x 120 Supera self-expanding stent. - 10/11/2014 s/p PTA of 70% left axillary stenosis. - 04/01/2015 PTA of left axillary and brachial in-stent restenosis with a 5 x 100 AngioSculpt and a 6 x 150 Bard Lutonix DCB, taking an 80% stenosis to less than 20% residual. - 10/18/2015 s/p PTA of left brachial in-stent restenosis with a 5 x 100 balloon and a 5 x 150 Bard Lutonix DCB. - Right upper extremity arterial duplex 07/25/2021 no evidence of hemodynamically significant PAD.  Monophasic and biphasic flow noted throughout the right upper extremity consistent with patent AV graft. - Continue statin and aspirin .   4.  History of end-stage renal disease, s/p renal transplant 2018: - She was previously on hemodialysis and is now s/p renal transplant 04/28/2016. - She follows with [facility].  - She may be facing HD soon with recent SCr trending up.  She is on steroids.  5.  Diabetes mellitus: Endocrinology is monitoring.  Hemoglobin A1c 04/17/2022 at 8.2-improving.  6.  Hypertension:  Typically well-controlled. - Goal BP 130/75mmHg or less.  - Continue amlodipine  5 mg daily and torsemide 20 mg daily. No changes today.  - Monitor home BP/HR intermittently as directed. Titrate meds to goal.  - Ensure low sodium diet, less than 2000mg /day.  BP Readings from Last 3 Encounters:  10/29/23 142/56  09/11/23 148/66  06/05/23 167/80   7.  Carotid artery disease: Consider repeat study in 2 to 3 years. - Carotid ultrasounds 04/2023 showing <50% stenosis in the bilateral ICAs.  8. DLD:  Dr. Orvis monitoring levels. No recent panel on file. Request from PCP.  - Goal LDL <70.  -  Continue rosuvastatin  20 mg daily. No changes today.  - Focus on lean protein and plant rich diet with avoidance of processed foods/saturated fats.   Follow up: 3 months   Thank you very much for the opportunity to participate in the care of Ms. [name]. If you have any questions, please do not hesitate to contact our office.   Alan Lamp Wende Blush PA-C Physician Assistant for Dr. Zachary Clause [facility]([place], [facility], [place] office locations) [facility]       [1] Past Medical History: Diagnosis Date  . Chronic kidney disease    Started HD ~2012 years ago,Tues,Thurs,Sat  . Diabetes mellitus    (CMS-HCC)   . Peripheral arterial disease    10/11/2014, successful PTA of 70% left axillary stenosis to <30% with a 5 x 100 angiosculpt balloon followed by a 5 x 150 and 5 x 40 Bard Lutonix DCB.  . Stroke (cerebrum)    (CMS-HCC)   . Syncope   [2] Past Surgical History: Procedure Laterality Date  . CATARACT EXTRACTION, BILATERAL Bilateral   . CESAREAN SECTION    . CHG ANGIO  EXTREMITY UNILAT N/A 04/01/2015   Procedure: Arteriogram Upper Extremity Left;  Surgeon: Zachary Roni Clause, MD;  Location: [facility];  Service: Cardiology  . CHG ANGIO EXTREMITY UNILAT N/A 10/18/2015   Procedure: left upper extremity angiogram with possible revascularization;  Surgeon: Zachary Roni Clause, MD;  Location: [facility];  Service: Cardiology  . CHOLECYSTECTOMY    . KIDNEY AND PANCREAS COMBINED TRANSPLANTATION    . PR CATH PLACE/CORON ANGIO, IMG SUPER/INTERP,W LEFT HEART VENTRICULOGRAPHY N/A 08/24/2021   Procedure: LEFT HEART CATHETERIZATION WITH POSSIBLE REVASCULARIZATION;  Surgeon: Zachary Roni Clause, MD;  Location: [facility];  Service: Cardiology  . RETINAL DETACHMENT SURGERY    . STENTS [facility] HISTORICAL RESULT)     LUE  . VENOUS ABLATION     2017  [3] Family History Problem Relation Age of Onset  . Heart disease Maternal Grandmother   . Heart  attack Maternal Grandfather   [4] Social History Tobacco Use  Smoking Status Never  Smokeless Tobacco Never  [5] Current Outpatient Medications on File Prior to Visit  Medication Sig Dispense Refill  . amlodipine  (NORVASC ) 5 MG tablet TAKE 1 TABLET (5 MG TOTAL) BY MOUTH DAILY. 90 tablet 3  . aspirin  (ECOTRIN) 81 MG tablet Take 1 tablet (81 mg total) by mouth daily.    . BASAGLAR  KWIKPEN U-100 INSULIN  100 unit/mL (3 mL) injection pen Inject 0.2 mL (20 Units total) under the skin three (3) times a day.    . blood-glucose meter,continuous Misc USE TO CHECK BLOOD SUGAR AS DIRECTED    . DEXCOM G6 SENSOR Devi Take as directed.    . DEXCOM G6 TRANSMITTER Devi USE EVERY 90 DAYS    . insulin  aspart (NOVOLOG  FLEXPEN SUBQ) Inject under the skin. Sliding scale with meals - 16 units base    . mycophenolate  (CELLCEPT ) 250 mg capsule Take 1 capsule (250 mg total) by mouth every twelve (12) hours.    . predniSONE  (DELTASONE ) 5 MG tablet Take 1 tablet (5 mg total) by mouth daily.    . rosuvastatin  (CRESTOR ) 20 MG tablet Take 1 tablet (20 mg total) by mouth daily.    . sodium bicarbonate  650 mg tablet 2 (two) times daily 2 in the morning and 2 at night    . tacrolimus  (PROGRAF ) 1 MG capsule TAKE 5 CAPSULES BY MOUTH EVERY 12 HOURS    . torsemide (DEMADEX) 20 MG tablet Take 1 tablet (20 mg total) by mouth daily.    . TRESIBA  FLEXTOUCH U-200 200 unit/mL (3 mL) InPn Inject under the skin daily.     No current facility-administered medications on file prior to visit.  [6] Allergies Allergen Reactions  . Fluorescein Shortness Of Breath    Hypertension  . Iodinated Contrast Media Anaphylaxis and Hives  . Iohexol  Anaphylaxis, Hives, Itching, Swelling and Other (See Comments)    13 HR PRE-MEDS REQUIRED 13 HR PRE-MEDS REQUIRED  . Latex, Natural Rubber Hives and Itching  . Other Anaphylaxis, Hives and Other (See Comments)    Diffuse swelling  . Acetaminophen  Other (See Comments)    CANNOT TAKE DUE TO KIDNEY    CANNOT TAKE DUE TO KIDNEY   CANNOT TAKE DUE TO KIDNEY  . Egg Other (See Comments)    CANNOT TAKE DUE TO AUTO IMMUNE DISORDER   . Nsaids (Non-Steroidal Anti-Inflammatory Drug) Other (See Comments)    CANNOT TAKE DUE TO KIDNEY   . Shellfish Containing Products Other (See Comments)    CANNOT TAKE DUE TO KIDNEY TRANSPLANT   . Lactose  Other (See Comments) and Nausea Only  . Sulfa (Sulfonamide Antibiotics) Itching    Other reaction(s): Other (See Comments)  CANNOT TAKE DUE TO KIDNEY TRANSPLANT   CANNOT TAKE DUE TO KIDNEY TRANSPLANT   CANNOT TAKE DUE TO KIDNEY TRANSPLANT

## 2023-10-29 NOTE — Progress Notes (Unsigned)
 [name] [address] [place]. [place] [address] [phone]       HOSPITAL FOLLOW UP NOTE  [name] Date of Birth:  Aug 27, 1970 Medical Record Number:  [id number]   Reason for Referral:  hospital stroke follow up   SUBJECTIVE:   CHIEF COMPLAINT:  No chief complaint on file.   HPI:   Morgan [name] is a 53 y.o. who  has a past medical history of Acute respiratory failure with hypoxia (HCC) (10/09/2023), Anemia, CKD (chronic kidney disease) stage V requiring chronic dialysis (HCC) (05/08/2012), Constipation, Coronary artery disease, CVA (cerebral vascular accident) (HCC) (05/12/2021), Diabetes mellitus without complication (HCC), Diabetic retinopathy (HCC), ESRD on hemodialysis (HCC) (06/05/2014), H/O detached retina repair, History of CVA (cerebrovascular accident), Hyperlipidemia, Hypertension, Insulin  overdose (05/08/2012), Kidney transplanted, Pneumonia, PVD (peripheral vascular disease) (HCC), S/P drug eluting coronary stent placement (08/24/2021), and Suicide attempt (HCC) (05/08/2012).  Patient presented on 05/12/2021 with numbness of entire right side of body upon waking. CT negative for acute stoke but showed small remote lacunar infarct of posterior limb of right internal capsule. MR brain showed  small acute infarct left thalamus without hemorrhage or mass effect.  MRA head with multifocal severe stenosis or short segment occlusion predominantly within the posterior circulation, advanced atrophy and chronic small vessel ischemic disease.  MRA neck normal.  TTE with LVEF 55-60%, grade 2 diastolic dysfunction, no interatrial shunt noted.  She is not a candidate for thrombolytics as she was out of the time window and symptoms now resolved. No rehab needs with eval. She was started on dapt for three weeks then Plavix  alone. Crestor  20mg  started. Personally reviewed hospitalization pertinent progress notes, lab work and imaging.   Evaluated by Dr Jerri.   Since discharge home, she reports continued numbness of the right side of her face and hand. She feels balance is not as good as it was prior. She had to hold the bannister when walking up stairs. She reports that she previously wore high heel shoes and now unable to wear these due to feeling unbalanced. She noticed feeling off balance when trying to get out of the tub from sitting position last week. No falls since stroke. She reports two falls prior to her stoke, balance may have been off before.   She feels tired and stays cold. She has not seen PCP, recently. She sees [facility]vis at [facility]. She sees [facility] every three months. She does not check BP at home. Uncertain when she takes amlodipine  (am or pm). She is followed by endocrinology at [facility]. She is taking Tresiba  32 units daily and Novolog  12u before meals with SSI. Morning CBGs have been 150-200. She has not been as active since stroke.   UPDATE 11/01/2021 SP:  Morgan [name] is a pleasant 53 year old African-American lady seen today for initial office follow-up visit following hospital admission for stroke in July 2023.  History is obtained from the patient and review of electronic medical records.  I have personally reviewed pertinent available imaging in PACS.Morgan [name] is a 53 y.o. female past medical of ESRD status post renal transplant on immunosuppression, diabetes, retinal detachment bilateral, hypertension, left thalamic stroke with essentially no residual symptoms (March 2023) presented to the emergency room  on 09/08/2021 for evaluation of sudden onset of tingling and numbness on the right hemibody with a last known well of around noon.  Her husband also provided history and said they went for lunch around 12 PM when she was perfectly normal  and then started noticing some tingling and numbness on the right side of her body. Also had paresthesias on left which is new. She reports a sensation being  oversensitive on that side.  He drove her to the ER and she was seen and evaluated by the triage PA who activated a code stroke.  Initial exam by Dr.Khaliqdina showed a NIH stroke scale-2.Taken for stat MRI because of concern for new stroke versus symptoms being recrudescence of old symptoms on the right and left could be new stroke vs hyperglycemia being stroke mimic.In the ED, fingerstick glucose was greater than 600.Blood pressure was in the normal range MRI scan of the brain shows tiny punctate right external capsule and periventricular white matter lacunar infarcts.  Remote.  Left thalamic lacunar infarct was also noted.  MR angiogram of the brain showed stable appearance of the severe intracranial destructive changes involving multiple focal high-grade stenosis of the mid basilar, severe bilateral ICA origin left PCA stenosis  2D echo showed ejection fraction of 50-55%.  Carotid ultrasound showed no significant extracranial stenosis.  LDL cholesterol 48 mg percent.  Hemoglobin A1c was 12.4.  Patient was prior to admission on dual antiplatelet therapy aspirin  and Plavix  and this was changed to aspirin  and Brilinta .  She states she is tolerating it well does get minor bruising but no bleeding episodes.  She states her blood pressure at home is pretty good 130s over 70s but today cannot be checked in the office as she has bilateral arm AV fistula and we cannot use a blood pressure cuff on her arm.  She still has some residual tingling on the face and right hand but it is getting better and is not bothersome.  She states her blood sugars control has been better since discharge.  She is tolerating Crestor  well without muscle aches and pains.  She has no new complaints.  She has not had any recurrent new stroke or TIA symptoms.  UPDATE 10/30/2023 ALL: Morgan [name] returns for follow up post lacunar infarct 04/2021 and recurrent CVA 08/2021. She was last seen by Dr Rosemarie 10/2021  PERTINENT IMAGING/LABS  CT no acute  abnormality, old right PLIC infarct MRI left thalamic small infarct MRA head and neck right P1 and P2, mid BA, distal left M1 and M2, bilateral ACAs moderate to severe stenosis 2D Echo EF 55 to 60%   A1C Lab Results  Component Value Date   HGBA1C 8.6 (H) 10/20/2023    Lipid Panel     Component Value Date/Time   CHOL 119 09/09/2021 0251   TRIG 183 (H) 09/09/2021 0251   HDL 34 (L) 09/09/2021 0251   CHOLHDL 3.5 09/09/2021 0251   VLDL 37 09/09/2021 0251   LDLCALC 48 09/09/2021 0251    ROS:   14 system review of systems performed and negative with exception of those listed in HPI  PMH:  Past Medical History:  Diagnosis Date   Acute respiratory failure with hypoxia (HCC) 10/09/2023   Anemia    low iron   CKD (chronic kidney disease) stage V requiring chronic dialysis (HCC) 05/08/2012   Constipation    when given iron   Coronary artery disease    CVA (cerebral vascular accident) (HCC) 05/12/2021   Diabetes mellitus without complication (HCC)    type 2   Diabetic retinopathy (HCC)    ESRD on hemodialysis (HCC) 06/05/2014   H/O detached retina repair    bilateral   History of CVA (cerebrovascular accident)    Hyperlipidemia  Hypertension    Insulin  overdose 05/08/2012   Kidney transplanted    Pneumonia    PVD (peripheral vascular disease) (HCC)    S/P drug eluting coronary stent placement 08/24/2021   AT [facility]   Suicide attempt [facility]) 05/08/2012    PSH:  Past Surgical History:  Procedure Laterality Date   ARTERIOVENOUS GRAFT PLACEMENT Left 07/28/2010   AV FISTULA PLACEMENT Right 11/25/2015   Procedure: INSERTION OF ARTERIOVENOUS (AV) GORE-TEX GRAFT RIGHT  ARM USING 4-7 MM X 45 CM STRETCH GRAFT.;  Surgeon: Lonni GORMAN Blade, MD;  Location: [facility] OR;  Service: Vascular;  Laterality: Right;   basilic vein transposition Left 02/26/2009   CARDIAC CATHETERIZATION     CESAREAN SECTION     CHOLECYSTECTOMY N/A 10/08/2014   Procedure: LAPAROSCOPIC  CHOLECYSTECTOMY ;  Surgeon: Debby Shipper, MD;  Location: [facility] OR;  Service: General;  Laterality: N/A;   EYE SURGERY     retinal detachment   KIDNEY TRANSPLANT  04/28/2016   REVISION OF ARTERIOVENOUS GORETEX GRAFT Left 05/13/2015   Procedure: REVISION OF ARTERIOVENOUS GORETEX GRAFT;  Surgeon: Lonni GORMAN Blade, MD;  Location: [facility] OR;  Service: Vascular;  Laterality: Left;    Social History:  Social History   Socioeconomic History   Marital status: Married    Spouse name: Not on file   Number of children: Not on file   Years of education: Not on file   Highest education level: Not on file  Occupational History   Not on file  Tobacco Use   Smoking status: Never   Smokeless tobacco: Never  Vaping Use   Vaping status: Never Used  Substance and Sexual Activity   Alcohol use: No    Alcohol/week: 0.0 standard drinks of alcohol   Drug use: No   Sexual activity: Not on file  Other Topics Concern   Not on file  Social History Narrative   L handed   Social Drivers of Health   Financial Resource Strain: Low Risk  (09/18/2023)   Received from [facility]   Overall Financial Resource Strain (CARDIA)    Difficulty of Paying Living Expenses: Not very hard  Food Insecurity: Patient Declined (10/09/2023)   Hunger Vital Sign    Worried About Running Out of Food in the Last Year: Patient declined    Ran Out of Food in the Last Year: Patient declined  Transportation Needs: Patient Declined (10/09/2023)   PRAPARE - Administrator, Civil Service (Medical): Patient declined    Lack of Transportation (Non-Medical): Patient declined  Physical Activity: Insufficiently Active (06/21/2020)   Received from [facility]   Exercise Vital Sign    On average, how many days per week do you engage in moderate to strenuous exercise (like a brisk walk)?: 3 days    On average, how many minutes do you engage in exercise at this level?: 30 min  Stress: Stress  Concern Present (06/21/2020)   Received from [facility] - Occupational Stress Questionnaire    Feeling of Stress : Rather much  Social Connections: Not on file  Intimate Partner Violence: Patient Declined (10/09/2023)   Humiliation, Afraid, Rape, and Kick questionnaire    Fear of Current or Ex-Partner: Patient declined    Emotionally Abused: Patient declined    Physically Abused: Patient declined    Sexually Abused: Patient declined    Family History:  Family History  Problem Relation Age of  Onset   Diabetes Maternal Grandmother    Gout Maternal Uncle     Medications:   Current Outpatient Medications on File Prior to Visit  Medication Sig Dispense Refill   amLODipine  (NORVASC ) 5 MG tablet Take 5 mg by mouth daily.  11   aspirin  EC 81 MG tablet Take 81 mg by mouth daily. Swallow whole.     clopidogrel  (PLAVIX ) 75 MG tablet Take 75 mg by mouth daily.     [Paused] FARXIGA  10 MG TABS tablet Take 10 mg by mouth daily.     Insulin  Aspart FlexPen (NOVOLOG ) 100 UNIT/ML Inject 8-13 Units into the skin 3 (three) times daily with meals. Inject per sliding scale:  70-200    8 units 201-250  9 units 251-300  10 units 301-350  11 units 351-400  12 units 401+       13 units     insulin  degludec (TRESIBA ) 200 UNIT/ML FlexTouch Pen Inject 15 Units into the skin 2 (two) times daily with a meal.     levonorgestrel (MIRENA) 20 MCG/24HR IUD 1 each by Intrauterine route once.     MOUNJARO 7.5 MG/0.5ML Pen Inject 7.5 mg into the skin every Tuesday.     mycophenolate  (CELLCEPT ) 250 MG capsule Take 1,000 mg by mouth every 12 (twelve) hours.     predniSONE  (DELTASONE ) 5 MG tablet Take 5 mg by mouth daily.     rosuvastatin  (CRESTOR ) 20 MG tablet Take 1 tablet (20 mg total) by mouth daily. 90 tablet 0   senna-docusate (SENOKOT-S) 8.6-50 MG tablet Take 2 tablets by mouth in the morning and at bedtime.     sodium bicarbonate  650 MG tablet Take  1,300 mg by mouth in the morning and at bedtime.     tacrolimus  (PROGRAF ) 1 MG capsule Take 3-4 mg by mouth in the morning and at bedtime. Take 4 capsules (4mg ) by mouth in the morning and 3 capsules (3mg ) in the evening.     No current facility-administered medications on file prior to visit.    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   Nsaids Other (See Comments)    Hx kidney transplant   Shellfish Allergy Other (See Comments)    Hx kidney transplant - told not to consume   Tylenol  [Acetaminophen ] Other (See Comments)    Patient states she can not take due to kidney   Lactose Nausea Only   Misc. Sulfonamide Containing Compounds Itching and Other (See Comments)    Hx kidney transplant      OBJECTIVE:  Physical Exam  There were no vitals filed for this visit.  There is no height or weight on file to calculate BMI. No results found.     11/21/2021    4:40 PM  Depression screen PHQ 2/9  Decreased Interest 0  Down, Depressed, Hopeless 0  PHQ - 2 Score 0     General: well developed, well nourished, seated, in no evident distress Head: head normocephalic and atraumatic.   Neck: supple with no carotid or supraclavicular bruits Cardiovascular: regular rate and rhythm,  no murmurs Musculoskeletal: no deformity Skin:  no rash/petichiae Vascular:  Normal pulses all extremities   Neurologic Exam Mental Status: Awake and fully alert.  Fluent speech and language.  Oriented to place and time. Recent and remote memory intact. Attention span, concentration and fund of knowledge  appropriate. Mood and affect appropriate.  Cranial Nerves: Fundoscopic exam reveals sharp disc margins. Pupils equal, briskly reactive to light. Extraocular movements full without nystagmus. Visual fields full to confrontation. Hearing intact. Facial sensation intact. Face, tongue, palate moves normally and symmetrically.  Motor: Normal bulk and tone. Normal strength in all tested extremity muscles Sensory.: decreased to soft touch of right face, arm and lower extremity  Coordination: Rapid alternating movements normal in all extremities. Finger-to-nose and heel-to-shin performed accurately bilaterally. Gait and Station: Arises from chair without difficulty. Stance is normal. Gait demonstrates normal stride length and balance with no assistive device. Tandem walk and heel toe slightly unbalanced.   Reflexes: 1+ and symmetric.    NIHSS  1 Modified Rankin  1   ASSESSMENT: Morgan [name] is a 53 y.o. year old female presenting to ER on 05/12/2021 with right sided hemi paresthesias.  Vascular risk factors include HTN, HLD, uncontrolled DM, ESRD s/p transplant.    PLAN:  Stroke: left thalamic small infarct secondary to small vessel disease : Residual deficit: right sided paresthesias. Continue clopidogrel  75 mg daily  and rosuvastatin  20mg  daily for secondary stroke prevention.  Discussed secondary stroke prevention measures and importance of close PCP follow up for aggressive stroke risk factor management. I have gone over the pathophysiology of stroke, warning signs and symptoms, risk factors and their management in some detail with instructions to go to the closest emergency room for symptoms of concern. HTN: BP goal <130/90.  Elevated in the office, today. 176/95, reading of right leg due to bilateral UE grafts. Continue amlodipine  5mg  daily per PCP. Continue to monitor.  HLD: LDL goal <70. Recent LDL 76. Continue rosuvastatin  20mg  daily per PCP.  DMII: A1c goal<7.0. Recent A1c 10.2.  uncontrolled. Continue insulin  per endocrinology. Continue close follow up with PCP.  Imbalance: will refer to PT for evaluation. Inpatient PT did not recommend continued therapy, however, patient reports balance is impaired since being home.  ESRD post transplant: continue close follow up with nephrology   Follow up in 6 months or call earlier if needed   CC:  [facility] provider: Dr. Rosemarie PCP: Orvis Glendia Ditch, MD    I spent 45 minutes of face-to-face and non-face-to-face time with patient.  This included previsit chart review including review of recent hospitalization, lab review, study review, order entry, electronic health record documentation, patient education regarding recent stroke including etiology, secondary stroke prevention measures and importance of managing stroke risk factors, residual deficits and typical recovery time and answered all other questions to patient satisfaction   Greig Forbes, [facility]  [facility] [address] [place], [place] [address]  Phone [phone] Fax [phone] Note: This document was prepared with digital dictation and possible smart phrase technology. Any transcriptional errors that result from this process are unintentional.

## 2023-10-30 ENCOUNTER — Ambulatory Visit (INDEPENDENT_AMBULATORY_CARE_PROVIDER_SITE_OTHER): Admitting: Family Medicine

## 2023-10-30 ENCOUNTER — Encounter: Payer: Self-pay | Admitting: Family Medicine

## 2023-10-30 VITALS — BP 186/64 | HR 95 | Ht 62.0 in | Wt 196.5 lb

## 2023-10-30 DIAGNOSIS — Z8673 Personal history of transient ischemic attack (TIA), and cerebral infarction without residual deficits: Secondary | ICD-10-CM

## 2023-10-30 NOTE — Patient Instructions (Signed)
 Below is our plan:  Stroke: left thalamic small infarct secondary to small vessel disease : Residual deficit: right sided paresthesias. Continue aspirin  81mg  and clopidogrel  75 mg daily . Continue rosuvastatin  20mg  daily for secondary stroke prevention.  Discussed secondary stroke prevention measures and importance of close PCP follow up for aggressive stroke risk factor management. I have gone over the pathophysiology of stroke, warning signs and symptoms, risk factors and their management in some detail with instructions to go to the closest emergency room for symptoms of concern. HTN: BP goal <130/90.  Elevated in the office, today. 186/64, reading with left wrist due to bilateral UE grafts. Asymptomatic. Home readings 130-150s/70s. Continue amlodipine  5mg  and torsemide 20mg  daily per PCP. Continue to monitor.  HLD: LDL goal <70. Recent LDL 82. Continue rosuvastatin  20mg  daily per PCP.  DMII: A1c goal<7.0. Recent A1c 8.6. uncontrolled. Continue insulin  per endocrinology. Continue close follow up with PCP.  Right hand numbness: continue to monitor. Continue home exercise program. Test temperature with dominate hand.  Imbalance: resolved.  ESRD post transplant: Concerns of renal rejection, continue close follow up with nephrology  Goals:  1) Maintain strict control of hypertension with blood pressure goal below 130/90 2) Maintain good control of diabetes with hemoglobin A1c goal below 7%  3) Maintain good control of lipids with LDL cholesterol goal below 70 mg/dL.  4) Eat a healthy diet with plenty of whole grains, cereals, fruits and vegetables, exercise regularly and maintain ideal body weight   Resources: https://www.williams.biz/  Please make sure you are staying well hydrated. I recommend 50-60 ounces daily. Well balanced diet and regular exercise encouraged. Consistent sleep schedule with 6-8 hours recommended.   Please  continue follow up with care team as directed.   Follow up with me as needed   You may receive a survey regarding today's visit. I encourage you to leave honest feed back as I do use this information to improve patient care. Thank you for seeing me today!

## 2023-11-06 NOTE — Nursing Note (Signed)
 Patient called and informed of arrival time at 0600 , to remain NPO after MN, and to bring bring a complete medication list. Patient instructed to take AM medications as directed by MD. Patient informed of and is agreeable to hospital visiting policy and to have a ride home arranged.  Informed to pack a overnight bag incase of potential overnight stay. Patient verbalized understanding of patient instructions.

## 2023-11-07 NOTE — Unmapped External Note (Signed)
 Cardiology Procedure Date of procedure:  11/07/23  Reason: CP, positive stress test  Attending: G. Adams  IVP dye: 50cc  Sedation:  Patient assessed post sedation/anesthesia and tolerated well.  Vital signs stable and no complications noted. 2V 50 Fent; 17 min  Anticoagulation: none  Estimated Blood Loss:  Minimal  Specimens:  None  Complications: None   CSHA Frailty Score:  4 - vulnerable  Syntax Score: intermediate  If you are not sure how to calculate score, you may use this calculator (but you will have to copy and paste into browser as Epic will not launch to an outside website): http://www.syntaxscore.com/  Appropriate Use Criteria: CAD Presentation: Stable angina. Anginal Classification within last 2 weeks: CCS II. Anti-Anginal Medications within last 2 weeks: Beta blocker . Stress or Imaging Study:   Stress Testing w/ CMR: Completed over 12 months ago ; Positive: Risk/Extent of ischemia is intermediate.  Procedure: 1. Left heart catheterization  Procedure  After obtaining informed consent, patient brought to the cardiac catheterization lab. Using approximately 10 mL of 1% lidocaine  the right groin was anesthetized and a 6 French sheath placed in the right femoral artery. Then used a JL4 and a JR4 diagnostic catheter and performed diagnostic when angiography. Then used a pigtail catheter and performed LVEDP.  Findings  Aortic pressure 125/63  Left ventricular pressure 125/1/23  No aortic valve gradient  Left main: normal  Left circumflex: stent proximally with approx 30% instent; small OM1, normal OM2, small OM3  LAD: small D1, small D2, small D3; stent in the mid LAD widely patent and jails the small D1 and D2  Right coronary artery:  small PDA  Assessment/Plan: Nonobstructive CAD  2. LVEDP approx 23mm Hg  3.  FU in 8-12 weeks  Zachary Clause, MD, MHS, [facility], FSCAI Director of Cardiovascular and Peripheral Vascular Research at [facility]  Associate Professor of Medicine at the [facility]  at [facility] November 07, 2023 8:42 AM

## 2023-11-07 NOTE — Interval H&P Note (Signed)
 DAY OF SURGERY UPDATE Patient here for Left Heart Catheterization  Denies chest pain, shortness of breath, palpitations, or recent fever/chills Pepcid , Benadryl , and solumedrol ordered for contrast allergy  H&P reviewed. The patient was examined and there are no changes to the H&P.  Callie A Kesler, NP 11/07/23 6:47 AM

## 2023-11-16 ENCOUNTER — Encounter (HOSPITAL_COMMUNITY): Payer: Self-pay | Admitting: *Deleted

## 2023-11-16 ENCOUNTER — Emergency Department (HOSPITAL_COMMUNITY)

## 2023-11-16 ENCOUNTER — Other Ambulatory Visit: Payer: Self-pay

## 2023-11-16 ENCOUNTER — Inpatient Hospital Stay (HOSPITAL_COMMUNITY)
Admission: EM | Admit: 2023-11-16 | Discharge: 2023-11-23 | DRG: 871 | Disposition: A | Discharge status: Home / Self Care | Attending: Internal Medicine | Admitting: Internal Medicine

## 2023-11-16 DIAGNOSIS — I5032 Chronic diastolic (congestive) heart failure: Secondary | ICD-10-CM | POA: Diagnosis present

## 2023-11-16 DIAGNOSIS — T380X5A Adverse effect of glucocorticoids and synthetic analogues, initial encounter: Secondary | ICD-10-CM | POA: Diagnosis present

## 2023-11-16 DIAGNOSIS — D84821 Immunodeficiency due to drugs: Secondary | ICD-10-CM | POA: Diagnosis present

## 2023-11-16 DIAGNOSIS — Z1152 Encounter for screening for COVID-19: Secondary | ICD-10-CM | POA: Diagnosis not present

## 2023-11-16 DIAGNOSIS — F419 Anxiety disorder, unspecified: Secondary | ICD-10-CM | POA: Diagnosis present

## 2023-11-16 DIAGNOSIS — I132 Hypertensive heart and chronic kidney disease with heart failure and with stage 5 chronic kidney disease, or end stage renal disease: Secondary | ICD-10-CM | POA: Diagnosis present

## 2023-11-16 DIAGNOSIS — Z886 Allergy status to analgesic agent status: Secondary | ICD-10-CM

## 2023-11-16 DIAGNOSIS — Z794 Long term (current) use of insulin: Secondary | ICD-10-CM

## 2023-11-16 DIAGNOSIS — N170 Acute kidney failure with tubular necrosis: Secondary | ICD-10-CM | POA: Diagnosis present

## 2023-11-16 DIAGNOSIS — J810 Acute pulmonary edema: Secondary | ICD-10-CM | POA: Diagnosis not present

## 2023-11-16 DIAGNOSIS — E11319 Type 2 diabetes mellitus with unspecified diabetic retinopathy without macular edema: Secondary | ICD-10-CM | POA: Diagnosis present

## 2023-11-16 DIAGNOSIS — Z8673 Personal history of transient ischemic attack (TIA), and cerebral infarction without residual deficits: Secondary | ICD-10-CM

## 2023-11-16 DIAGNOSIS — E1122 Type 2 diabetes mellitus with diabetic chronic kidney disease: Secondary | ICD-10-CM | POA: Diagnosis present

## 2023-11-16 DIAGNOSIS — E785 Hyperlipidemia, unspecified: Secondary | ICD-10-CM | POA: Diagnosis present

## 2023-11-16 DIAGNOSIS — R6521 Severe sepsis with septic shock: Principal | ICD-10-CM | POA: Diagnosis present

## 2023-11-16 DIAGNOSIS — J9601 Acute respiratory failure with hypoxia: Secondary | ICD-10-CM | POA: Diagnosis present

## 2023-11-16 DIAGNOSIS — Z733 Stress, not elsewhere classified: Secondary | ICD-10-CM

## 2023-11-16 DIAGNOSIS — E119 Type 2 diabetes mellitus without complications: Secondary | ICD-10-CM | POA: Diagnosis not present

## 2023-11-16 DIAGNOSIS — Y830 Surgical operation with transplant of whole organ as the cause of abnormal reaction of the patient, or of later complication, without mention of misadventure at the time of the procedure: Secondary | ICD-10-CM | POA: Diagnosis present

## 2023-11-16 DIAGNOSIS — J189 Pneumonia, unspecified organism: Secondary | ICD-10-CM | POA: Diagnosis present

## 2023-11-16 DIAGNOSIS — E872 Acidosis, unspecified: Secondary | ICD-10-CM | POA: Diagnosis present

## 2023-11-16 DIAGNOSIS — Z940 Kidney transplant status: Secondary | ICD-10-CM | POA: Diagnosis not present

## 2023-11-16 DIAGNOSIS — E1151 Type 2 diabetes mellitus with diabetic peripheral angiopathy without gangrene: Secondary | ICD-10-CM | POA: Diagnosis present

## 2023-11-16 DIAGNOSIS — N185 Chronic kidney disease, stage 5: Secondary | ICD-10-CM | POA: Diagnosis present

## 2023-11-16 DIAGNOSIS — E8809 Other disorders of plasma-protein metabolism, not elsewhere classified: Secondary | ICD-10-CM | POA: Diagnosis present

## 2023-11-16 DIAGNOSIS — E1165 Type 2 diabetes mellitus with hyperglycemia: Secondary | ICD-10-CM | POA: Diagnosis present

## 2023-11-16 DIAGNOSIS — I2510 Atherosclerotic heart disease of native coronary artery without angina pectoris: Secondary | ICD-10-CM | POA: Diagnosis present

## 2023-11-16 DIAGNOSIS — E8722 Chronic metabolic acidosis: Secondary | ICD-10-CM | POA: Diagnosis present

## 2023-11-16 DIAGNOSIS — Z7902 Long term (current) use of antithrombotics/antiplatelets: Secondary | ICD-10-CM

## 2023-11-16 DIAGNOSIS — Z91041 Radiographic dye allergy status: Secondary | ICD-10-CM

## 2023-11-16 DIAGNOSIS — T8611 Kidney transplant rejection: Secondary | ICD-10-CM | POA: Diagnosis present

## 2023-11-16 DIAGNOSIS — A419 Sepsis, unspecified organism: Secondary | ICD-10-CM | POA: Diagnosis present

## 2023-11-16 DIAGNOSIS — I272 Pulmonary hypertension, unspecified: Secondary | ICD-10-CM

## 2023-11-16 DIAGNOSIS — D631 Anemia in chronic kidney disease: Secondary | ICD-10-CM | POA: Diagnosis present

## 2023-11-16 DIAGNOSIS — Z91040 Latex allergy status: Secondary | ICD-10-CM

## 2023-11-16 DIAGNOSIS — Z79899 Other long term (current) drug therapy: Secondary | ICD-10-CM

## 2023-11-16 DIAGNOSIS — Z91012 Allergy to eggs: Secondary | ICD-10-CM

## 2023-11-16 DIAGNOSIS — Z79624 Long term (current) use of inhibitors of nucleotide synthesis: Secondary | ICD-10-CM

## 2023-11-16 DIAGNOSIS — M7989 Other specified soft tissue disorders: Secondary | ICD-10-CM | POA: Diagnosis not present

## 2023-11-16 DIAGNOSIS — E66812 Obesity, class 2: Secondary | ICD-10-CM | POA: Diagnosis present

## 2023-11-16 DIAGNOSIS — D849 Immunodeficiency, unspecified: Secondary | ICD-10-CM | POA: Diagnosis not present

## 2023-11-16 DIAGNOSIS — Z91013 Allergy to seafood: Secondary | ICD-10-CM

## 2023-11-16 DIAGNOSIS — N189 Chronic kidney disease, unspecified: Secondary | ICD-10-CM | POA: Diagnosis not present

## 2023-11-16 DIAGNOSIS — Z7982 Long term (current) use of aspirin: Secondary | ICD-10-CM

## 2023-11-16 DIAGNOSIS — Z882 Allergy status to sulfonamides status: Secondary | ICD-10-CM

## 2023-11-16 DIAGNOSIS — Z79621 Long term (current) use of calcineurin inhibitor: Secondary | ICD-10-CM

## 2023-11-16 DIAGNOSIS — I10 Essential (primary) hypertension: Secondary | ICD-10-CM | POA: Diagnosis not present

## 2023-11-16 DIAGNOSIS — Z955 Presence of coronary angioplasty implant and graft: Secondary | ICD-10-CM

## 2023-11-16 DIAGNOSIS — Z7984 Long term (current) use of oral hypoglycemic drugs: Secondary | ICD-10-CM

## 2023-11-16 DIAGNOSIS — Z6835 Body mass index (BMI) 35.0-35.9, adult: Secondary | ICD-10-CM

## 2023-11-16 DIAGNOSIS — Z7985 Long-term (current) use of injectable non-insulin antidiabetic drugs: Secondary | ICD-10-CM

## 2023-11-16 DIAGNOSIS — R578 Other shock: Secondary | ICD-10-CM | POA: Diagnosis not present

## 2023-11-16 DIAGNOSIS — Z833 Family history of diabetes mellitus: Secondary | ICD-10-CM

## 2023-11-16 LAB — TROPONIN I (HIGH SENSITIVITY)
Troponin I (High Sensitivity): 88 ng/L — ABNORMAL HIGH (ref <18)
Troponin I (High Sensitivity): 109 ng/L (ref <18)

## 2023-11-16 LAB — HEPATIC FUNCTION PANEL
ALT: 17 U/L (ref 0–44)
AST: 17 U/L (ref 15–41)
Albumin: 3.1 g/dL — ABNORMAL LOW (ref 3.5–5.0)
Alkaline Phosphatase: 58 U/L (ref 38–126)
Bilirubin, Direct: 0.2 mg/dL (ref 0.0–0.2)
Indirect Bilirubin: 0.8 mg/dL (ref 0.3–0.9)
Total Bilirubin: 1 mg/dL (ref 0.0–1.2)
Total Protein: 6.3 g/dL — ABNORMAL LOW (ref 6.5–8.1)

## 2023-11-16 LAB — BASIC METABOLIC PANEL WITH GFR
Anion gap: 14 (ref 5–15)
BUN: 56 mg/dL — ABNORMAL HIGH (ref 6–20)
CO2: 18 mmol/L — ABNORMAL LOW (ref 22–32)
Calcium: 9.8 mg/dL (ref 8.9–10.3)
Chloride: 109 mmol/L (ref 98–111)
Creatinine, Ser: 3.99 mg/dL — ABNORMAL HIGH (ref 0.44–1.00)
GFR, Estimated: 13 mL/min — ABNORMAL LOW (ref >60)
Glucose, Bld: 134 mg/dL — ABNORMAL HIGH (ref 70–99)
Potassium: 4.4 mmol/L (ref 3.5–5.1)
Sodium: 141 mmol/L (ref 135–145)

## 2023-11-16 LAB — RESP PANEL BY RT-PCR (RSV, FLU A&B, COVID)  RVPGX2
Influenza A by PCR: NEGATIVE
Influenza B by PCR: NEGATIVE
Resp Syncytial Virus by PCR: NEGATIVE
SARS Coronavirus 2 by RT PCR: NEGATIVE

## 2023-11-16 LAB — CBC
HCT: 29.6 % — ABNORMAL LOW (ref 36.0–46.0)
Hemoglobin: 9.2 g/dL — ABNORMAL LOW (ref 12.0–15.0)
MCH: 27.3 pg (ref 26.0–34.0)
MCHC: 31.1 g/dL (ref 30.0–36.0)
MCV: 87.8 fL (ref 80.0–100.0)
Platelets: 408 K/uL — ABNORMAL HIGH (ref 150–400)
RBC: 3.37 MIL/uL — ABNORMAL LOW (ref 3.87–5.11)
RDW: 14.2 % (ref 11.5–15.5)
WBC: 15.1 K/uL — ABNORMAL HIGH (ref 4.0–10.5)
nRBC: 0 % (ref 0.0–0.2)

## 2023-11-16 LAB — PROTIME-INR
INR: 1 (ref 0.8–1.2)
Prothrombin Time: 14.2 s (ref 11.4–15.2)

## 2023-11-16 LAB — BRAIN NATRIURETIC PEPTIDE: B Natriuretic Peptide: 876.9 pg/mL — ABNORMAL HIGH (ref 0.0–100.0)

## 2023-11-16 LAB — I-STAT CG4 LACTIC ACID, ED
Lactic Acid, Venous: 1.1 mmol/L (ref 0.5–1.9)
Lactic Acid, Venous: 0.8 mmol/L (ref 0.5–1.9)

## 2023-11-16 MED ORDER — VANCOMYCIN HCL IN DEXTROSE 1-5 GM/200ML-% IV SOLN: 1000.0000 mg | Freq: Once | INTRAVENOUS | Status: DC

## 2023-11-16 MED ORDER — IPRATROPIUM-ALBUTEROL 0.5-2.5 (3) MG/3ML IN SOLN: 3.0000 mL | Freq: Four times a day (QID) | RESPIRATORY_TRACT | Status: DC | PRN

## 2023-11-16 MED ORDER — ASPIRIN 81 MG PO TBEC
81.0000 mg | DELAYED_RELEASE_TABLET | Freq: Every day | ORAL | Status: DC
  Administered 2023-11-17 – 2023-11-23 (×7): 81 mg via ORAL
  Filled 2023-11-16 (×7): qty 1

## 2023-11-16 MED ORDER — TACROLIMUS 1 MG PO CAPS: 3.0000 mg | ORAL_CAPSULE | Freq: Two times a day (BID) | ORAL | Status: DC

## 2023-11-16 MED ORDER — POLYETHYLENE GLYCOL 3350 17 G PO PACK: 17.0000 g | PACK | Freq: Every day | ORAL | Status: DC | PRN

## 2023-11-16 MED ORDER — CHLORHEXIDINE GLUCONATE CLOTH 2 % EX PADS
6.0000 | MEDICATED_PAD | Freq: Every day | CUTANEOUS | Status: DC
  Administered 2023-11-17 – 2023-11-19 (×3): 6 via TOPICAL

## 2023-11-16 MED ORDER — SODIUM CHLORIDE 0.9 % IV SOLN
1.0000 g | Freq: Every day | INTRAVENOUS | Status: DC
  Administered 2023-11-17: 1 g via INTRAVENOUS
  Filled 2023-11-16: qty 10

## 2023-11-16 MED ORDER — MIDODRINE HCL 5 MG PO TABS
5.0000 mg | ORAL_TABLET | Freq: Three times a day (TID) | ORAL | Status: DC
  Administered 2023-11-17 – 2023-11-23 (×15): 5 mg via ORAL
  Filled 2023-11-16 (×16): qty 1

## 2023-11-16 MED ORDER — NOREPINEPHRINE 4 MG/250ML-% IV SOLN
0.0000 ug/min | INTRAVENOUS | Status: DC
  Administered 2023-11-16: 5 ug/min via INTRAVENOUS
  Filled 2023-11-16 (×2): qty 250

## 2023-11-16 MED ORDER — MYCOPHENOLATE MOFETIL 250 MG PO CAPS
1000.0000 mg | ORAL_CAPSULE | Freq: Two times a day (BID) | ORAL | Status: DC
Start: 2023-11-17 — End: 2023-11-17
  Administered 2023-11-17: 1000 mg via ORAL
  Filled 2023-11-16: qty 4

## 2023-11-16 MED ORDER — HEPARIN SODIUM (PORCINE) 5000 UNIT/ML IJ SOLN
5000.0000 [IU] | Freq: Two times a day (BID) | INTRAMUSCULAR | Status: DC
  Administered 2023-11-17: 5000 [IU] via SUBCUTANEOUS
  Filled 2023-11-16: qty 1

## 2023-11-16 MED ORDER — SODIUM CHLORIDE 0.9 % IV BOLUS
250.0000 mL | Freq: Once | INTRAVENOUS | Status: AC
  Administered 2023-11-16: 250 mL via INTRAVENOUS

## 2023-11-16 MED ORDER — METRONIDAZOLE 500 MG/100ML IV SOLN
500.0000 mg | Freq: Once | INTRAVENOUS | Status: AC
  Administered 2023-11-16: 500 mg via INTRAVENOUS
  Filled 2023-11-16: qty 100

## 2023-11-16 MED ORDER — SODIUM BICARBONATE 650 MG PO TABS
1300.0000 mg | ORAL_TABLET | Freq: Two times a day (BID) | ORAL | Status: DC
Start: 2023-11-17 — End: 2023-11-18
  Administered 2023-11-17 – 2023-11-18 (×2): 1300 mg via ORAL
  Filled 2023-11-16 (×4): qty 2

## 2023-11-16 MED ORDER — SODIUM CHLORIDE 0.9 % IV SOLN
500.0000 mg | Freq: Every day | INTRAVENOUS | Status: DC
  Administered 2023-11-17: 500 mg via INTRAVENOUS
  Filled 2023-11-16: qty 5

## 2023-11-16 MED ORDER — PREDNISONE 5 MG PO TABS
5.0000 mg | ORAL_TABLET | Freq: Every day | ORAL | Status: DC
Start: 2023-11-17 — End: 2023-11-17
  Administered 2023-11-17: 5 mg via ORAL
  Filled 2023-11-16: qty 1

## 2023-11-16 MED ORDER — TACROLIMUS 1 MG PO CAPS
4.0000 mg | ORAL_CAPSULE | Freq: Every day | ORAL | Status: DC
Start: 2023-11-17 — End: 2023-11-17
  Administered 2023-11-17: 4 mg via ORAL
  Filled 2023-11-16: qty 4

## 2023-11-16 MED ORDER — ACETAMINOPHEN 325 MG PO TABS
650.0000 mg | ORAL_TABLET | Freq: Once | ORAL | Status: AC
  Administered 2023-11-16: 650 mg via ORAL
  Filled 2023-11-16: qty 2

## 2023-11-16 MED ORDER — ROSUVASTATIN CALCIUM 5 MG PO TABS
10.0000 mg | ORAL_TABLET | Freq: Every day | ORAL | Status: DC
  Administered 2023-11-17 – 2023-11-22 (×6): 10 mg via ORAL
  Filled 2023-11-16 (×3): qty 1
  Filled 2023-11-16: qty 2
  Filled 2023-11-16 (×2): qty 1

## 2023-11-16 MED ORDER — CLOPIDOGREL BISULFATE 75 MG PO TABS
75.0000 mg | ORAL_TABLET | Freq: Every day | ORAL | Status: DC
  Administered 2023-11-17 – 2023-11-23 (×7): 75 mg via ORAL
  Filled 2023-11-16 (×7): qty 1

## 2023-11-16 MED ORDER — DOCUSATE SODIUM 100 MG PO CAPS: 100.0000 mg | ORAL_CAPSULE | Freq: Two times a day (BID) | ORAL | Status: DC | PRN

## 2023-11-16 MED ORDER — VANCOMYCIN HCL 1500 MG/300ML IV SOLN
1500.0000 mg | Freq: Once | INTRAVENOUS | Status: AC
  Administered 2023-11-16: 1500 mg via INTRAVENOUS
  Filled 2023-11-16: qty 300

## 2023-11-16 MED ORDER — TACROLIMUS 1 MG PO CAPS: 3.0000 mg | ORAL_CAPSULE | Freq: Every day | ORAL | Status: DC

## 2023-11-16 MED ORDER — INSULIN ASPART 100 UNIT/ML IJ SOLN
1.0000 [IU] | INTRAMUSCULAR | Status: DC
  Administered 2023-11-17 (×3): 1 [IU] via SUBCUTANEOUS

## 2023-11-16 MED ORDER — SODIUM CHLORIDE 0.9 % IV SOLN
2.0000 g | Freq: Once | INTRAVENOUS | Status: AC
  Administered 2023-11-16: 2 g via INTRAVENOUS
  Filled 2023-11-16: qty 12.5

## 2023-11-16 NOTE — ED Notes (Signed)
 Patient to take home anti-rejection medications per Britini, PA.

## 2023-11-16 NOTE — ED Notes (Signed)
 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

## 2023-11-16 NOTE — ED Triage Notes (Signed)
 Pt to ER with c/o SHOB starting earlier toady.  Pt reports cough productive of yellow sputum.

## 2023-11-16 NOTE — H&P (Signed)
 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 She vomited once before coming to ICU. In ED, her Temp 101.2 F. She was hypotensive and she was given NS 0.9% 250 cc bolus and started on Levophed , currently on 4 mcg/min. She make urine and denied dysuria, back pain, and hematuria. Denied URTI symptoms, wheezing, CP, rash, abd pain, or diarrhea. Denied smoking, illicit drug use, and alcohol drinking. She is compliant with meds.  Pertinent  Medical History  Additional hx: 10/08/2023: admitted due to acute hypoxic respiratory failure.  She left AMA on 10/10/2023. 10/19/2023, admitted due to hypertensive emergency and pulm edema. Significant Hospital Events: Including procedures, antibiotic start and stop dates in addition to other pertinent events   11/16/2023: ED eval, given Vanco, Cefepime , and Flagyl  11/16/2023: PCCM consulted for ICU admission Interim History / Subjective:   Objective    Blood pressure (!) 84/61, pulse (!) 103, temperature (!) 101.2 F (38.4 C), resp. rate 18, height 5' 2 (1.575 m), weight 89.1 kg, SpO2 96%.        Intake/Output Summary (Last 24 hours) at 11/16/2023 2308 Last data filed at 11/16/2023 2306 Gross per 24 hour  Intake 347.74 ml  Output --  Net 347.74 ml   Filed Weights   11/16/23 1914  Weight: 89.1 kg    Examination: General: alert, oriented x4, and comfortable. On RA. SpO2 97%  HENT: PERL, normal pharynx  and oral mucosa. No LNE or thyromegaly. No JVD Lungs: symmetrical air entry bilaterally. No crackles or wheezing Cardiovascular: NL S1/S2. No m/g/r Abdomen: no distension or tenderness Extremities: trace edema. 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) Chronic metabolic acidosis due to CKD-5 -Resume home meds  HTN, dyslipidemia, CAD, D-CHF (10/10/2023, EF 50-55%, G2DD) Mild pulm edema -Hold BP meds -ASA, Plavix  and statin  -Monitor resp status -Hold IVF   Anemia of chronic illness -Monitor   DM-2 (HbA1c 8.6%) -ISS  Labs   CBC: Recent Labs  Lab 11/16/23 2050  WBC 15.1*  HGB 9.2*  HCT 29.6*  MCV 87.8  PLT 408*    Basic Metabolic Panel: Recent Labs  Lab 11/16/23 2050  NA 141  K 4.4  CL 109  CO2 18*  GLUCOSE 134*  BUN 56*  CREATININE 3.99*  CALCIUM  9.8   GFR: Estimated Creatinine Clearance: 16.7 mL/min (A) (by C-G formula based on SCr of 3.99 mg/dL (H)). Recent Labs  Lab 11/16/23 2050 11/16/23 2056 11/16/23 2248  WBC 15.1*  --   --   LATICACIDVEN  --  1.1 0.8    Liver Function Tests: No results for input(s): AST, ALT, ALKPHOS, BILITOT, PROT, ALBUMIN  in the last 168 hours. No results for input(s): LIPASE, AMYLASE  in the last 168 hours. No results for input(s): AMMONIA in the last 168 hours.  ABG    Component Value Date/Time   HCO3 19.0 (L) 10/19/2023 1845   TCO2 20 (L) 10/19/2023 1845   ACIDBASEDEF 6.0 (H) 10/19/2023 1845   O2SAT 83 10/19/2023 1845     Coagulation Profile: Recent Labs  Lab 11/16/23 2120  INR 1.0    Cardiac Enzymes: No results for input(s): CKTOTAL, CKMB, CKMBINDEX, TROPONINI in the  last 168 hours.  HbA1C: Hgb A1c MFr Bld  Date/Time Value Ref Range Status  10/20/2023 09:08 AM 8.6 (H) 4.8 - 5.6 % Final    Comment:    (NOTE) Diagnosis of Diabetes The following HbA1c ranges recommended by the American Diabetes Association (ADA) may be used as an aid in the diagnosis of diabetes mellitus.  Hemoglobin             Suggested A1C NGSP%              Diagnosis  <5.7                   Non Diabetic  5.7-6.4                Pre-Diabetic  >6.4                   Diabetic  <7.0                   Glycemic control for                       adults with diabetes.    09/09/2021 02:51 AM 12.4 (H) 4.8 - 5.6 % Final    Comment:    (NOTE) Pre diabetes:          5.7%-6.4%  Diabetes:              >6.4%  Glycemic control for   <7.0% adults with diabetes     CBG: No results for input(s): GLUCAP in the last 168 hours.  Review of Systems:   Review of Systems  Constitutional:  Positive for chills, fever and malaise/fatigue. Negative for diaphoresis and weight loss.  Respiratory:  Positive for cough, sputum production and shortness of breath. Negative for hemoptysis and wheezing.   Cardiovascular:  Positive for leg swelling. Negative for chest pain, palpitations, orthopnea, claudication and PND.  Gastrointestinal:  Positive for nausea and vomiting. Negative for abdominal pain, blood in stool, constipation, diarrhea and melena.  Genitourinary:  Negative for dysuria, frequency and urgency.  Skin:  Negative for itching and rash.     Past Medical History:  She,  has a past medical history of Acute respiratory failure with hypoxia (HCC) (10/09/2023), Anemia, CKD (chronic kidney disease) stage V requiring chronic dialysis (HCC) (05/08/2012), Constipation, Coronary artery disease, CVA (cerebral vascular accident) (HCC) (05/12/2021), Diabetes mellitus without complication (HCC), Diabetic retinopathy (HCC), ESRD on hemodialysis (HCC) (06/05/2014), H/O detached retina repair, History  of CVA (cerebrovascular accident), Hyperlipidemia, Hypertension, Insulin  overdose (05/08/2012), Kidney transplanted, Pneumonia, PVD (peripheral vascular disease) (HCC), S/P drug eluting coronary stent placement (08/24/2021), and Suicide attempt (HCC) (05/08/2012).   Surgical History:   Past Surgical History:  Procedure Laterality Date   ARTERIOVENOUS GRAFT PLACEMENT Left 07/28/2010   AV FISTULA PLACEMENT Right 11/25/2015   Procedure: INSERTION OF ARTERIOVENOUS (AV) GORE-TEX GRAFT RIGHT  ARM USING 4-7 MM X 45 CM STRETCH GRAFT.;  Surgeon: Lonni GORMAN Blade, MD;  Location: [facility] OR;  Service: Vascular;  Laterality: Right;   basilic vein transposition Left 02/26/2009   CARDIAC CATHETERIZATION     CESAREAN SECTION     CHOLECYSTECTOMY N/A 10/08/2014   Procedure: LAPAROSCOPIC CHOLECYSTECTOMY ;  Surgeon: Debby Shipper, MD;  Location: [facility] OR;  Service: General;  Laterality: N/A;   EYE SURGERY     retinal detachment   KIDNEY TRANSPLANT  04/28/2016   REVISION OF ARTERIOVENOUS GORETEX GRAFT Left 05/13/2015   Procedure: REVISION OF ARTERIOVENOUS GORETEX GRAFT;  Surgeon: Lonni GORMAN Blade, MD;  Location: [facility] OR;  Service: Vascular;  Laterality: Left;     Social History:   reports that she has never smoked. She has never used smokeless tobacco. She reports that she does not drink alcohol and does not use drugs.   Family History:  Her family history includes Diabetes in her maternal grandmother; Gout in her maternal uncle.   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   Nsaids Other (See Comments)     Hx kidney transplant   Shellfish Allergy Other (See Comments)    Hx kidney transplant - told not to consume   Tylenol  [Acetaminophen ] Other (See Comments)    Patient states she can not take due to kidney   Lactose Nausea Only   Misc. Sulfonamide Containing Compounds Itching and Other (See Comments)    Hx kidney transplant     Home Medications  Prior to Admission medications   Medication Sig Start Date End Date Taking? Authorizing Provider  amLODipine  (NORVASC ) 5 MG tablet Take 5 mg by mouth daily. 01/03/18   [provider]  aspirin  EC 81 MG tablet Take 81 mg by mouth daily. Swallow whole.    [provider]  clopidogrel  (PLAVIX ) 75 MG tablet Take 75 mg by mouth daily.    [provider]  FARXIGA  10 MG TABS tablet Take 10 mg by mouth daily. 09/08/23   [provider]  Insulin  Aspart FlexPen (NOVOLOG ) 100 UNIT/ML Inject 8-13 Units into the skin 3 (three) times daily with meals. Inject per sliding scale:  70-200    8 units 201-250  9 units 251-300  10 units 301-350  11 units 351-400  12 units 401+       13 units 10/23/21   [provider]  insulin  degludec (TRESIBA ) 200 UNIT/ML FlexTouch Pen Inject 15 Units into the skin 2 (two) times daily with a meal.    [provider]  levonorgestrel (MIRENA) 20 MCG/24HR IUD 1 each by Intrauterine route once.    [provider]  MOUNJARO 7.5 MG/0.5ML Pen Inject 7.5 mg into the skin every Tuesday. 05/13/23   [provider]  mycophenolate  (CELLCEPT ) 250 MG capsule Take 1,000 mg by mouth every 12 (twelve) hours.    [provider]  predniSONE  (DELTASONE ) 5 MG tablet Take 5 mg by mouth daily.    [provider]  rosuvastatin  (CRESTOR ) 20 MG tablet Take 1 tablet (20 mg total) by mouth daily. 09/11/21 02/26/24  Elgergawy, Brayton GORMAN, MD  senna-docusate (SENOKOT-S) 8.6-50 MG tablet Take 2 tablets by mouth in the morning  and at bedtime. Patient not taking: Reported on  10/30/2023 09/25/23   [provider]  sodium bicarbonate  650 MG tablet Take 1,300 mg by mouth in the morning and at bedtime. 07/04/20   [provider]  tacrolimus  (PROGRAF ) 1 MG capsule Take 3-4 mg by mouth in the morning and at bedtime. Take 4 capsules (4mg ) by mouth in the morning and 3 capsules (3mg ) in the evening.    [provider]  torsemide (DEMADEX) 20 MG tablet Take 20 mg by mouth daily. 10/22/23   [provider]     Critical care time: 52 min    Mancel Ply, MD [facility] Pager: see AMION

## 2023-11-16 NOTE — ED Notes (Signed)
CC MD at bedside 

## 2023-11-16 NOTE — ED Provider Notes (Signed)
 [place] EMERGENCY DEPARTMENT AT [facility] Provider Note   CSN: [id number] Arrival date & time: 11/16/23  1857    Patient presents with: Shortness of Breath   Morgan [name] is a 53 y.o. female here for evaluation of fever and shortness of breath.  Patient renal transplant patient followed by [facility], recent diagnosis of rejection of transplanted kidney, here for evaluation of shortness of breath.  Noted earlier today she developed nonproductive cough, fever.  Subsequently brought here.  She states has a history of fluid overload.  She is on antirejection meds and chronic 5 mg prednisone .  She was recently placed on Lasix  last week for fluid overload had heart cath at [facility] which denies chance of heart failure per husband.  Husband does note that patient gets flash pulmonary edema very easily.  She does make urine denies any dysuria, hematuria.  She denies any chest pain, back pain, headache, abdominal pain, nausea or vomiting.  She states she has chronic loose stool from her antirejection meds.  No blood in stool.  No sick contacts.  She states she was followed by [facility] within the month and they told her she will end up back on dialysis due to her rejection   HPI     Prior to Admission medications   Medication Sig Start Date End Date Taking? Authorizing Provider  amLODipine  (NORVASC ) 5 MG tablet Take 5 mg by mouth daily. 01/03/18   [provider]  aspirin  EC 81 MG tablet Take 81 mg by mouth daily. Swallow whole.    [provider]  clopidogrel  (PLAVIX ) 75 MG tablet Take 75 mg by mouth daily.    [provider]  FARXIGA  10 MG TABS tablet Take 10 mg by mouth daily. 09/08/23   [provider]  Insulin  Aspart FlexPen (NOVOLOG ) 100 UNIT/ML Inject 8-13 Units into the skin 3 (three) times daily with meals. Inject per sliding scale:  70-200    8 units 201-250  9 units 251-300  10 units 301-350  11 units 351-400  12 units 401+       13 units  10/23/21   [provider]  insulin  degludec (TRESIBA ) 200 UNIT/ML FlexTouch Pen Inject 15 Units into the skin 2 (two) times daily with a meal.    [provider]  levonorgestrel (MIRENA) 20 MCG/24HR IUD 1 each by Intrauterine route once.    [provider]  MOUNJARO 7.5 MG/0.5ML Pen Inject 7.5 mg into the skin every Tuesday. 05/13/23   [provider]  mycophenolate  (CELLCEPT ) 250 MG capsule Take 1,000 mg by mouth every 12 (twelve) hours.    [provider]  predniSONE  (DELTASONE ) 5 MG tablet Take 5 mg by mouth daily.    [provider]  rosuvastatin  (CRESTOR ) 20 MG tablet Take 1 tablet (20 mg total) by mouth daily. 09/11/21 02/26/24  Elgergawy, Brayton RAMAN, MD  senna-docusate (SENOKOT-S) 8.6-50 MG tablet Take 2 tablets by mouth in the morning and at bedtime. Patient not taking: Reported on 10/30/2023 09/25/23   [provider]  sodium bicarbonate  650 MG tablet Take 1,300 mg by mouth in the morning and at bedtime. 07/04/20   [provider]  tacrolimus  (PROGRAF ) 1 MG capsule Take 3-4 mg by mouth in the morning and at bedtime. Take 4 capsules (4mg ) by mouth in the morning and 3 capsules (3mg ) in the evening.    [provider]  torsemide (DEMADEX) 20 MG tablet Take 20 mg by mouth daily. 10/22/23  [provider]    Allergies: Fluorescite [fluorescein]; Iodinated contrast media; Iohexol ; Latex; Egg solids, whole; Nsaids; Shellfish allergy; Tylenol  [acetaminophen ]; Lactose; and Misc. sulfonamide containing compounds    Review of Systems  Constitutional:  Positive for activity change, appetite change and fever.  HENT: Negative.    Respiratory:  Positive for cough and shortness of breath.   Cardiovascular:  Positive for leg swelling. Negative for chest pain and palpitations.  Gastrointestinal:  Positive for diarrhea (chronic).  Genitourinary: Negative.   Musculoskeletal: Negative.   Skin: Negative.   Neurological:   Positive for weakness (generalized).  All other systems reviewed and are negative.   Updated Vital Signs BP 90/76   Pulse 99   Temp 98.8 F (37.1 C) (Oral)   Resp (!) 25   Ht 5' 2 (1.575 m)   Wt 89.1 kg   SpO2 95%   BMI 35.93 kg/m   Physical Exam Vitals and nursing note reviewed.  Constitutional:      General: She is in acute distress.     Appearance: She is well-developed. She is ill-appearing and toxic-appearing.  HENT:     Head: Normocephalic and atraumatic.     Mouth/Throat:     Mouth: Mucous membranes are moist.  Eyes:     Pupils: Pupils are equal, round, and reactive to light.  Cardiovascular:     Rate and Rhythm: Tachycardia present.     Pulses: Normal pulses.          Radial pulses are 2+ on the right side and 2+ on the left side.       Dorsalis pedis pulses are 2+ on the right side and 2+ on the left side.     Heart sounds: Normal heart sounds.  Pulmonary:     Effort: No respiratory distress.     Breath sounds: Rales present.     Comments: Rales bilateral lower lobes Chest:     Comments: Nontender chest wall Abdominal:     General: Bowel sounds are normal. There is no distension.     Palpations: Abdomen is soft.     Comments: Soft, nontender  Musculoskeletal:        General: Normal range of motion.     Cervical back: Normal range of motion.     Right lower leg: No tenderness. Edema present.     Left lower leg: No tenderness. Edema present.     Comments: Pitting edema bilateral lower extremities Graft bilateral upper arms with palpable thrill, no overlying erythema or warmth  Skin:    General: Skin is warm and dry.     Capillary Refill: Capillary refill takes less than 2 seconds.     Comments: No obvious rashes or lesions on exposed skin  Neurological:     General: No focal deficit present.     Mental Status: She is alert and oriented to person, place, and time.     Cranial Nerves: No cranial nerve deficit.     Motor: Weakness present.     Comments:  Global weakness throughout  Psychiatric:        Mood and Affect: Mood normal.     (all labs ordered are listed, but only abnormal results are displayed) Labs Reviewed  BASIC METABOLIC PANEL WITH GFR - Abnormal; Notable for the following components:      Result Value   CO2 18 (*)    Glucose, Bld 134 (*)    BUN 56 (*)    Creatinine, Ser 3.99 (*)  GFR, Estimated 13 (*)    All other components within normal limits  CBC - Abnormal; Notable for the following components:   WBC 15.1 (*)    RBC 3.37 (*)    Hemoglobin 9.2 (*)    HCT 29.6 (*)    Platelets 408 (*)    All other components within normal limits  BRAIN NATRIURETIC PEPTIDE - Abnormal; Notable for the following components:   B Natriuretic Peptide 876.9 (*)    All other components within normal limits  URINALYSIS, W/ REFLEX TO CULTURE (INFECTION SUSPECTED) - Abnormal; Notable for the following components:   Glucose, UA 50 (*)    Protein, ur >=300 (*)    All other components within normal limits  HEPATIC FUNCTION PANEL - Abnormal; Notable for the following components:   Total Protein 6.3 (*)    Albumin  3.1 (*)    All other components within normal limits  CBG MONITORING, ED - Abnormal; Notable for the following components:   Glucose-Capillary 137 (*)    All other components within normal limits  TROPONIN I (HIGH SENSITIVITY) - Abnormal; Notable for the following components:   Troponin I (High Sensitivity) 88 (*)    All other components within normal limits  TROPONIN I (HIGH SENSITIVITY) - Abnormal; Notable for the following components:   Troponin I (High Sensitivity) 109 (*)    All other components within normal limits  RESP PANEL BY RT-PCR (RSV, FLU A&B, COVID)  RVPGX2  CULTURE, BLOOD (ROUTINE X 2)  CULTURE, BLOOD (ROUTINE X 2)  MRSA NEXT GEN BY PCR, NASAL  EXPECTORATED SPUTUM ASSESSMENT W GRAM STAIN, RFLX TO RESP C  MRSA NEXT GEN BY PCR, NASAL  PROTIME-INR  LEGIONELLA PNEUMOPHILA SEROGP 1 UR AG  STREP PNEUMONIAE  URINARY ANTIGEN  CBC  BASIC METABOLIC PANEL WITH GFR  MAGNESIUM  PHOSPHORUS  PROCALCITONIN  CALCIUM , IONIZED  I-STAT CG4 LACTIC ACID, ED  I-STAT CG4 LACTIC ACID, ED    EKG: None  Radiology: DG Chest Portable 1 View Result Date: 11/16/2023 CLINICAL DATA:  Chest pain and shortness of breath EXAM: PORTABLE CHEST 1 VIEW COMPARISON:  Chest x-ray 10/19/2023 FINDINGS: Heart is enlarged. There central pulmonary vascular congestion. There is no focal lung infiltrate, pleural effusion or pneumothorax. No acute fractures are seen. There are vascular stents in the left adnexa. IMPRESSION: Cardiomegaly with central pulmonary vascular congestion. Electronically Signed   By: Greig Pique M.D.   On: 11/16/2023 20:01     .Critical Care  Performed by: Edie Rosebud LABOR, PA-C Authorized by: Edie Rosebud LABOR, PA-C   Critical care provider statement:    Critical care time (minutes):  76   Critical care was necessary to treat or prevent imminent or life-threatening deterioration of the following conditions:  Renal failure, respiratory failure, sepsis and shock   Critical care was time spent personally by me on the following activities:  Development of treatment plan with patient or surrogate, discussions with consultants, evaluation of patient's response to treatment, examination of patient, ordering and review of laboratory studies, ordering and review of radiographic studies, ordering and performing treatments and interventions, pulse oximetry, re-evaluation of patient's condition and review of old charts    Medications Ordered in the ED  vancomycin  (VANCOREADY) IVPB 1500 mg/300 mL (1,500 mg Intravenous New Bag/Given 11/16/23 2318)  norepinephrine  (LEVOPHED ) 4mg  in (0.016 mg/mL) premix infusion (4 mcg/min Intravenous Infusion Verify 11/17/23 0010)  Chlorhexidine  Gluconate Cloth 2 % PADS 6 each (has no administration in time range)  docusate sodium  (COLACE)  capsule 100 mg (has no administration  in time range)  polyethylene glycol (MIRALAX  / GLYCOLAX ) packet 17 g (has no administration in time range)  heparin  injection 5,000 Units (has no administration in time range)  ipratropium-albuterol  (DUONEB) 0.5-2.5 (3) MG/3ML nebulizer solution 3 mL (has no administration in time range)  aspirin  EC tablet 81 mg (has no administration in time range)  clopidogrel  (PLAVIX ) tablet 75 mg (has no administration in time range)  mycophenolate  (CELLCEPT ) capsule 1,000 mg (has no administration in time range)  predniSONE  (DELTASONE ) tablet 5 mg (has no administration in time range)  rosuvastatin  (CRESTOR ) tablet 10 mg (has no administration in time range)  sodium bicarbonate  tablet 1,300 mg (1,300 mg Oral Not Given 11/17/23 0022)  cefTRIAXone  (ROCEPHIN ) 1 g in sodium chloride  0.9 % 100 mL IVPB (has no administration in time range)  azithromycin  (ZITHROMAX ) 500 mg in sodium chloride  0.9 % 250 mL IVPB (has no administration in time range)  midodrine  (PROAMATINE ) tablet 5 mg (has no administration in time range)  insulin  aspart (novoLOG ) injection 1-3 Units (1 Units Subcutaneous Given 11/17/23 0023)  tacrolimus  (PROGRAF ) capsule 4 mg (has no administration in time range)    And  tacrolimus  (PROGRAF ) capsule 3 mg (has no administration in time range)  calcium  gluconate 1 g/ 50 mL sodium chloride  IVPB (has no administration in time range)  ceFEPIme  (MAXIPIME ) 2 g in sodium chloride  0.9 % 100 mL IVPB (0 g Intravenous Stopped 11/16/23 2201)  metroNIDAZOLE  (FLAGYL ) IVPB 500 mg (0 mg Intravenous Stopped 11/16/23 2306)  acetaminophen  (TYLENOL ) tablet 650 mg (650 mg Oral Given 11/16/23 2116)  sodium chloride  0.9 % bolus 250 mL (0 mLs Intravenous Stopped 11/16/23 2251)    Clinical Course as of 11/17/23 0029  Sat Nov 16, 2023  2259 Dr. Dub with PCCM to see for admission [BH]    Clinical Course User Index [BH] Raechal Raben A, PA-C    53 year old here for evaluation of weakness, fever and cough.  She is  extensive prior medical history including renal transplant followed by [facility] transplant with subsequent recently diagnosed rejection of transplanted kidney here for evaluation of feeling unwell.  Today developed fever, cough and shortness of breath.  On arrival she is febrile, tachycardic, tachypneic however not normotensive.  She appears grossly fluid overloaded with pitting edema to her lower extremities, rales in the lungs.  Oxygen 91% on room air placed on 2 L via nasal cannula.  She had a recent admission for flash pulmonary edema where she was placed on BiPAP.  Sepsis antibiotics given due to immunocompromise state, sepsis vital signs.  Will only give small fluid bolus, to 50 cc due to appearance of gross fluid overload, chest x-ray with edema.  Labs and imaging personally viewed and interpreted:  Chest x-ray cardiomegaly and pulm edema CBC leukocytosis 15.1, hemoglobin 9 point Metabolic panel creatinine 3.99 Hepatic function panel without significant abnormality Troponin 88-109 BNP 876 Lactic 0.8 COVID, Flu, RSV negative  Reassessed.  BPs continue to downtrend.  Now 70 systolic.  Getting IV fluid bolus  Reassessed.  Patient persistently hypotensive despite IV fluid bolus.  Started on Levophed  for blood pressure management given systolics persistently below 90s, maps less than 65.  Patient here with picture suspicious for septic shock, unclear cause.  Cannot get CT imaging with contrast due to renal failure.  Reviewed extensive notes from [facility] transplant team, [facility] cardiology, recent hospital admission here.  Discussed with PCCM who is agreeable to evaluate patient for admission  Discussed with  attending, Dr. Tegeler who is agreeable to above treatment, plan and disposition  The patient appears reasonably stabilized for admission considering the current resources, flow, and capabilities available in the ED at this time, and I doubt any other [facility] requiring further screening and/or  treatment in the ED prior to admission.                                   Medical Decision Making Amount and/or Complexity of Data Reviewed Independent Historian: spouse External Data Reviewed: labs, radiology, ECG and notes. Labs: ordered. Decision-making details documented in ED Course. Radiology: ordered and independent interpretation performed. Decision-making details documented in ED Course. ECG/medicine tests: ordered and independent interpretation performed. Decision-making details documented in ED Course.  Risk OTC drugs. Prescription drug management. Parenteral controlled substances. Decision regarding hospitalization. Diagnosis or treatment significantly limited by social determinants of health.       Final diagnoses:  Septic shock (HCC)  Renal transplant recipient  Chronic kidney disease, unspecified CKD stage  Acute respiratory failure with hypoxia (HCC)  Immunosuppression (HCC)  Acute pulmonary edema [facility])    ED Discharge Orders     None          Rawn Quiroa A, PA-C 11/17/23 0029    Tegeler, Lonni PARAS, MD 11/17/23 [phone]

## 2023-11-16 NOTE — H&P (Incomplete)
 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 She vomited once before coming to ICU. In ED, her Temp 101.2 F. She was hypotensive and she was given NS 0.9% 250 cc bolus and started on Levophed , currently on 4 mcg/min. She make urine and denied dysuria, back pain, and hematuria. Denied URTI symptoms, wheezing, CP, rash, abd pain, or diarrhea. Denied smoking, illicit drug use, and alcohol drinking. She is compliant with meds.  Pertinent  Medical History  Additional hx: 10/08/2023: admitted due to acute hypoxic respiratory failure.  She left AMA on 10/10/2023. 10/19/2023, admitted due to hypertensive emergency and pulm edema. Significant Hospital Events: Including procedures, antibiotic start and stop dates in addition to other pertinent events   11/16/2023: ED eval, given Vanco, Cefepime , and Flagyl  11/16/2023: PCCM consulted for ICU admission Interim History / Subjective:   Objective    Blood pressure (!) 84/61, pulse (!) 103, temperature (!) 101.2 F (38.4 C), resp. rate 18, height 5' 2 (1.575 m), weight 89.1 kg, SpO2 96%.        Intake/Output Summary (Last 24 hours) at 11/16/2023 2308 Last data filed at 11/16/2023 2306 Gross per 24 hour  Intake 347.74 ml  Output --  Net 347.74 ml   Filed Weights   11/16/23 1914  Weight: 89.1 kg    Examination: General: alert, oriented x4, and comfortable. On RA. SpO2 97%  HENT: PERL, normal pharynx  and oral mucosa. No LNE or thyromegaly. No JVD Lungs: symmetrical air entry bilaterally. No crackles or wheezing Cardiovascular: NL S1/S2. No m/g/r Abdomen: no distension or tenderness Extremities: trace edema. 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) Chronic metabolic acidosis due to CKD-5 -Resume home meds  HTN, dyslipidemia, CAD, D-CHF (10/10/2023, EF 50-55%, G2DD) Mild pulm edema -Hold BP meds -ASA, Plavix  and statin  -Monitor resp status -Hold IVF   Anemia of chronic illness -Monitor   DM-2 (HbA1c 8.6%) -ISS  Labs   CBC: Recent Labs  Lab 11/16/23 2050  WBC 15.1*  HGB 9.2*  HCT 29.6*  MCV 87.8  PLT 408*    Basic Metabolic Panel: Recent Labs  Lab 11/16/23 2050  NA 141  K 4.4  CL 109  CO2 18*  GLUCOSE 134*  BUN 56*  CREATININE 3.99*  CALCIUM  9.8   GFR: Estimated Creatinine Clearance: 16.7 mL/min (A) (by C-G formula based on SCr of 3.99 mg/dL (H)). Recent Labs  Lab 11/16/23 2050 11/16/23 2056 11/16/23 2248  WBC 15.1*  --   --   LATICACIDVEN  --  1.1 0.8    Liver Function Tests: No results for input(s): AST, ALT, ALKPHOS, BILITOT, PROT, ALBUMIN  in the last 168 hours. No results for input(s): LIPASE, AMYLASE  in the last 168 hours. No results for input(s): AMMONIA in the last 168 hours.  ABG    Component Value Date/Time   HCO3 19.0 (L) 10/19/2023 1845   TCO2 20 (L) 10/19/2023 1845   ACIDBASEDEF 6.0 (H) 10/19/2023 1845   O2SAT 83 10/19/2023 1845     Coagulation Profile: Recent Labs  Lab 11/16/23 2120  INR 1.0    Cardiac Enzymes: No results for input(s): CKTOTAL, CKMB, CKMBINDEX, TROPONINI in the  last 168 hours.  HbA1C: Hgb A1c MFr Bld  Date/Time Value Ref Range Status  10/20/2023 09:08 AM 8.6 (H) 4.8 - 5.6 % Final    Comment:    (NOTE) Diagnosis of Diabetes The following HbA1c ranges recommended by the American Diabetes Association (ADA) may be used as an aid in the diagnosis of diabetes mellitus.  Hemoglobin             Suggested A1C NGSP%              Diagnosis  <5.7                   Non Diabetic  5.7-6.4                Pre-Diabetic  >6.4                   Diabetic  <7.0                   Glycemic control for                       adults with diabetes.    09/09/2021 02:51 AM 12.4 (H) 4.8 - 5.6 % Final    Comment:    (NOTE) Pre diabetes:          5.7%-6.4%  Diabetes:              >6.4%  Glycemic control for   <7.0% adults with diabetes     CBG: No results for input(s): GLUCAP in the last 168 hours.  Review of Systems:   ROS   Past Medical History:  She,  has a past medical history of Acute respiratory failure with hypoxia (HCC) (10/09/2023), Anemia, CKD (chronic kidney disease) stage V requiring chronic dialysis (HCC) (05/08/2012), Constipation, Coronary artery disease, CVA (cerebral vascular accident) (HCC) (05/12/2021), Diabetes mellitus without complication (HCC), Diabetic retinopathy (HCC), ESRD on hemodialysis (HCC) (06/05/2014), H/O detached retina repair, History of CVA (cerebrovascular accident), Hyperlipidemia, Hypertension, Insulin  overdose (05/08/2012), Kidney transplanted, Pneumonia, PVD (peripheral vascular disease) (HCC), S/P drug eluting coronary stent placement (08/24/2021), and Suicide attempt (HCC) (05/08/2012).   Surgical History:   Past Surgical History:  Procedure Laterality Date  . ARTERIOVENOUS GRAFT PLACEMENT Left 07/28/2010  . AV FISTULA PLACEMENT Right 11/25/2015   Procedure: INSERTION OF ARTERIOVENOUS (AV) GORE-TEX GRAFT RIGHT  ARM USING 4-7 MM X 45 CM STRETCH GRAFT.;  Surgeon: Lonni GORMAN Blade, MD;  Location: [facility] OR;   Service: Vascular;  Laterality: Right;  . basilic vein transposition Left 02/26/2009  . CARDIAC CATHETERIZATION    . CESAREAN SECTION    . CHOLECYSTECTOMY N/A 10/08/2014   Procedure: LAPAROSCOPIC CHOLECYSTECTOMY ;  Surgeon: Debby Shipper, MD;  Location: [facility] OR;  Service: General;  Laterality: N/A;  . EYE SURGERY     retinal detachment  . KIDNEY TRANSPLANT  04/28/2016  . REVISION OF ARTERIOVENOUS GORETEX GRAFT Left 05/13/2015   Procedure: REVISION OF ARTERIOVENOUS GORETEX GRAFT;  Surgeon: Lonni GORMAN Blade, MD;  Location: [facility] OR;  Service: Vascular;  Laterality: Left;     Social History:   reports that she has never smoked. She has never used smokeless tobacco. She reports that she does not drink alcohol and does not use drugs.   Family History:  Her family history includes Diabetes in her maternal grandmother; Gout in her maternal uncle.   Allergies Allergies  Allergen Reactions  . Fluorescite [Fluorescein] Shortness Of Breath and Hypertension  . Iodinated Contrast Media Anaphylaxis, Hives and Other (See Comments)    Diffuse swelling  . Iohexol  Anaphylaxis, Hives, Itching, Swelling and Other (See Comments)    12 hour pre-meds required   . Latex Itching  . 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  . Nsaids Other (See Comments)    Hx kidney transplant  . Shellfish Allergy Other (See Comments)    Hx kidney transplant - told not to consume  . Tylenol  [Acetaminophen ] Other (See Comments)    Patient states she can not take due to kidney  . Lactose Nausea Only  . Misc. Sulfonamide Containing Compounds Itching and Other (See Comments)    Hx kidney transplant     Home Medications  Prior to Admission medications   Medication Sig Start Date End Date Taking? Authorizing Provider  amLODipine  (NORVASC ) 5 MG tablet Take 5 mg by mouth daily. 01/03/18   [provider]  aspirin  EC 81 MG tablet  Take 81 mg by mouth daily. Swallow whole.    [provider]  clopidogrel  (PLAVIX ) 75 MG tablet Take 75 mg by mouth daily.    [provider]  FARXIGA  10 MG TABS tablet Take 10 mg by mouth daily. 09/08/23   [provider]  Insulin  Aspart FlexPen (NOVOLOG ) 100 UNIT/ML Inject 8-13 Units into the skin 3 (three) times daily with meals. Inject per sliding scale:  70-200    8 units 201-250  9 units 251-300  10 units 301-350  11 units 351-400  12 units 401+       13 units 10/23/21   [provider]  insulin  degludec (TRESIBA ) 200 UNIT/ML FlexTouch Pen Inject 15 Units into the skin 2 (two) times daily with a meal.    [provider]  levonorgestrel (MIRENA) 20 MCG/24HR IUD 1 each by Intrauterine route once.    [provider]  MOUNJARO 7.5 MG/0.5ML Pen Inject 7.5 mg into the skin every Tuesday. 05/13/23   [provider]  mycophenolate  (CELLCEPT ) 250 MG capsule Take 1,000 mg by mouth every 12 (twelve) hours.    [provider]  predniSONE  (DELTASONE ) 5 MG tablet Take 5 mg by mouth daily.    [provider]  rosuvastatin  (CRESTOR ) 20 MG tablet Take 1 tablet (20 mg total) by mouth daily. 09/11/21 02/26/24  Elgergawy, Brayton RAMAN, MD  senna-docusate (SENOKOT-S) 8.6-50 MG tablet Take 2 tablets by mouth in the morning and at bedtime. Patient not taking: Reported on 10/30/2023 09/25/23   [provider]  sodium bicarbonate  650 MG tablet Take 1,300 mg by mouth in the morning and at bedtime. 07/04/20   [provider]  tacrolimus  (PROGRAF ) 1 MG capsule Take 3-4 mg by mouth in the morning and at bedtime. Take 4 capsules (4mg ) by mouth in the morning and 3 capsules (3mg ) in the evening.    [provider]  torsemide (DEMADEX) 20 MG tablet Take 20 mg by mouth daily. 10/22/23  [provider]     Critical care time: ***

## 2023-11-16 NOTE — Progress Notes (Signed)
 Pt being followed by [facility] for Sepsis protocol.

## 2023-11-17 ENCOUNTER — Inpatient Hospital Stay (HOSPITAL_COMMUNITY)

## 2023-11-17 ENCOUNTER — Encounter (HOSPITAL_COMMUNITY)

## 2023-11-17 DIAGNOSIS — J9601 Acute respiratory failure with hypoxia: Secondary | ICD-10-CM | POA: Diagnosis not present

## 2023-11-17 DIAGNOSIS — R6521 Severe sepsis with septic shock: Secondary | ICD-10-CM | POA: Diagnosis not present

## 2023-11-17 DIAGNOSIS — E119 Type 2 diabetes mellitus without complications: Secondary | ICD-10-CM

## 2023-11-17 DIAGNOSIS — A419 Sepsis, unspecified organism: Secondary | ICD-10-CM | POA: Diagnosis not present

## 2023-11-17 DIAGNOSIS — J810 Acute pulmonary edema: Secondary | ICD-10-CM | POA: Diagnosis not present

## 2023-11-17 LAB — RESPIRATORY PANEL BY PCR

## 2023-11-17 LAB — CBC
HCT: 28.4 % — ABNORMAL LOW (ref 36.0–46.0)
Hemoglobin: 8.7 g/dL — ABNORMAL LOW (ref 12.0–15.0)
MCH: 26.9 pg (ref 26.0–34.0)
MCHC: 30.6 g/dL (ref 30.0–36.0)
MCV: 87.7 fL (ref 80.0–100.0)
Platelets: 366 K/uL (ref 150–400)
RBC: 3.24 MIL/uL — ABNORMAL LOW (ref 3.87–5.11)
RDW: 14.2 % (ref 11.5–15.5)
WBC: 11.9 K/uL — ABNORMAL HIGH (ref 4.0–10.5)
nRBC: 0 % (ref 0.0–0.2)

## 2023-11-17 LAB — BASIC METABOLIC PANEL WITH GFR
Anion gap: 16 — ABNORMAL HIGH (ref 5–15)
BUN: 55 mg/dL — ABNORMAL HIGH (ref 6–20)
CO2: 15 mmol/L — ABNORMAL LOW (ref 22–32)
Calcium: 9.2 mg/dL (ref 8.9–10.3)
Chloride: 109 mmol/L (ref 98–111)
Creatinine, Ser: 3.97 mg/dL — ABNORMAL HIGH (ref 0.44–1.00)
GFR, Estimated: 13 mL/min — ABNORMAL LOW (ref >60)
Glucose, Bld: 160 mg/dL — ABNORMAL HIGH (ref 70–99)
Potassium: 3.6 mmol/L (ref 3.5–5.1)
Sodium: 140 mmol/L (ref 135–145)

## 2023-11-17 LAB — URINALYSIS, W/ REFLEX TO CULTURE (INFECTION SUSPECTED)
Bacteria, UA: NONE SEEN
Bilirubin Urine: NEGATIVE
Glucose, UA: 50 mg/dL — AB
Hgb urine dipstick: NEGATIVE
Ketones, ur: NEGATIVE mg/dL
Leukocytes,Ua: NEGATIVE
Nitrite: NEGATIVE
Protein, ur: >=300 mg/dL — AB
Specific Gravity, Urine: 1.013 (ref 1.005–1.030)
pH: 6 (ref 5.0–8.0)

## 2023-11-17 LAB — BRAIN NATRIURETIC PEPTIDE: B Natriuretic Peptide: 1208.1 pg/mL — ABNORMAL HIGH (ref 0.0–100.0)

## 2023-11-17 LAB — GLUCOSE, CAPILLARY
Glucose-Capillary: 140 mg/dL — ABNORMAL HIGH (ref 70–99)
Glucose-Capillary: 149 mg/dL — ABNORMAL HIGH (ref 70–99)
Glucose-Capillary: 154 mg/dL — ABNORMAL HIGH (ref 70–99)
Glucose-Capillary: 186 mg/dL — ABNORMAL HIGH (ref 70–99)
Glucose-Capillary: 204 mg/dL — ABNORMAL HIGH (ref 70–99)
Glucose-Capillary: 207 mg/dL — ABNORMAL HIGH (ref 70–99)
Glucose-Capillary: 225 mg/dL — ABNORMAL HIGH (ref 70–99)

## 2023-11-17 LAB — HEPARIN LEVEL (UNFRACTIONATED): Heparin Unfractionated: 0.97 [IU]/mL — ABNORMAL HIGH (ref 0.30–0.70)

## 2023-11-17 LAB — D-DIMER, QUANTITATIVE: D-Dimer, Quant: 0.75 ug{FEU}/mL — ABNORMAL HIGH (ref 0.00–0.50)

## 2023-11-17 LAB — STREP PNEUMONIAE URINARY ANTIGEN: Strep Pneumo Urinary Antigen: NEGATIVE

## 2023-11-17 LAB — PROCALCITONIN: Procalcitonin: 0.57 ng/mL

## 2023-11-17 LAB — LACTIC ACID, PLASMA
Lactic Acid, Venous: 1.5 mmol/L (ref 0.5–1.9)
Lactic Acid, Venous: 1.2 mmol/L (ref 0.5–1.9)

## 2023-11-17 LAB — CBG MONITORING, ED: Glucose-Capillary: 137 mg/dL — ABNORMAL HIGH (ref 70–99)

## 2023-11-17 LAB — MAGNESIUM: Magnesium: 2 mg/dL (ref 1.7–2.4)

## 2023-11-17 LAB — PHOSPHORUS: Phosphorus: 3.1 mg/dL (ref 2.5–4.6)

## 2023-11-17 LAB — MRSA NEXT GEN BY PCR, NASAL: MRSA by PCR Next Gen: NOT DETECTED

## 2023-11-17 MED ORDER — TACROLIMUS 1 MG PO CAPS
4.0000 mg | ORAL_CAPSULE | Freq: Every day | ORAL | Status: DC
Start: 2023-11-18 — End: 2023-11-23
  Administered 2023-11-18 – 2023-11-22 (×5): 4 mg via ORAL
  Filled 2023-11-17 (×6): qty 4

## 2023-11-17 MED ORDER — MIDAZOLAM HCL 2 MG/2ML IJ SOLN
1.0000 mg | Freq: Once | INTRAMUSCULAR | Status: AC
  Administered 2023-11-17: 1 mg via INTRAVENOUS
  Filled 2023-11-17: qty 2

## 2023-11-17 MED ORDER — SODIUM CHLORIDE 0.9 % IV SOLN
2.0000 g | Freq: Every day | INTRAVENOUS | Status: AC
Start: 2023-11-18 — End: 2023-11-22
  Administered 2023-11-18 – 2023-11-21 (×4): 2 g via INTRAVENOUS
  Filled 2023-11-17 (×4): qty 20

## 2023-11-17 MED ORDER — FUROSEMIDE 10 MG/ML IJ SOLN
80.0000 mg | INTRAMUSCULAR | Status: AC
  Administered 2023-11-17: 80 mg via INTRAVENOUS
  Filled 2023-11-17: qty 8

## 2023-11-17 MED ORDER — HEPARIN BOLUS VIA INFUSION
4200.0000 [IU] | Freq: Once | INTRAVENOUS | Status: AC
  Administered 2023-11-17: 4200 [IU] via INTRAVENOUS
  Filled 2023-11-17: qty 4200

## 2023-11-17 MED ORDER — TACROLIMUS 1 MG PO CAPS
3.0000 mg | ORAL_CAPSULE | Freq: Every day | ORAL | Status: AC
  Administered 2023-11-18: 3 mg via ORAL
  Filled 2023-11-17: qty 3

## 2023-11-17 MED ORDER — TACROLIMUS 1 MG PO CAPS
3.0000 mg | ORAL_CAPSULE | Freq: Every day | ORAL | Status: DC
  Administered 2023-11-18 – 2023-11-23 (×6): 3 mg via ORAL
  Filled 2023-11-17 (×6): qty 3

## 2023-11-17 MED ORDER — DEXMEDETOMIDINE HCL IN NACL 400 MCG/100ML IV SOLN
0.0000 ug/kg/h | INTRAVENOUS | Status: DC
  Administered 2023-11-17: 0.9 ug/kg/h via INTRAVENOUS
  Administered 2023-11-17: 0.4 ug/kg/h via INTRAVENOUS
  Administered 2023-11-17: 0.9 ug/kg/h via INTRAVENOUS
  Administered 2023-11-18: 0.7 ug/kg/h via INTRAVENOUS
  Filled 2023-11-17 (×4): qty 100

## 2023-11-17 MED ORDER — HYDROCORTISONE SOD SUC (PF) 100 MG IJ SOLR
100.0000 mg | Freq: Two times a day (BID) | INTRAMUSCULAR | Status: DC
  Administered 2023-11-17 – 2023-11-18 (×3): 100 mg via INTRAVENOUS
  Filled 2023-11-17 (×3): qty 2

## 2023-11-17 MED ORDER — SODIUM CHLORIDE 0.9 % IV SOLN
INTRAVENOUS | Status: AC | PRN
Start: 2023-11-17 — End: 2023-11-18

## 2023-11-17 MED ORDER — HEPARIN (PORCINE) 25000 UT/250ML-% IV SOLN
900.0000 [IU]/h | INTRAVENOUS | Status: DC
  Administered 2023-11-18 – 2023-11-19 (×2): 900 [IU]/h via INTRAVENOUS
  Filled 2023-11-17 (×2): qty 250

## 2023-11-17 MED ORDER — INSULIN ASPART 100 UNIT/ML IJ SOLN
0.0000 [IU] | INTRAMUSCULAR | Status: DC
  Administered 2023-11-17 (×2): 3 [IU] via SUBCUTANEOUS
  Administered 2023-11-17: 2 [IU] via SUBCUTANEOUS
  Administered 2023-11-17 – 2023-11-18 (×2): 3 [IU] via SUBCUTANEOUS
  Administered 2023-11-18: 2 [IU] via SUBCUTANEOUS

## 2023-11-17 MED ORDER — CALCIUM GLUCONATE-NACL 1-0.675 GM/50ML-% IV SOLN: 1.0000 g | Freq: Once | INTRAVENOUS | Status: DC

## 2023-11-17 MED ORDER — PANTOPRAZOLE SODIUM 40 MG PO TBEC
40.0000 mg | DELAYED_RELEASE_TABLET | Freq: Every day | ORAL | Status: DC
  Administered 2023-11-18 – 2023-11-23 (×6): 40 mg via ORAL
  Filled 2023-11-17 (×7): qty 1

## 2023-11-17 MED ORDER — HEPARIN (PORCINE) 25000 UT/250ML-% IV SOLN
1150.0000 [IU]/h | INTRAVENOUS | Status: DC
  Administered 2023-11-17: 1150 [IU]/h via INTRAVENOUS
  Filled 2023-11-17: qty 250

## 2023-11-17 MED ORDER — AZITHROMYCIN 500 MG PO TABS
500.0000 mg | ORAL_TABLET | Freq: Every day | ORAL | Status: AC
  Administered 2023-11-18 – 2023-11-19 (×2): 500 mg via ORAL
  Filled 2023-11-17 (×2): qty 1

## 2023-11-17 NOTE — Progress Notes (Signed)
 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   Latex Itching   Nsaids Other (See Comments)    Hx kidney transplant   Tylenol  [Acetaminophen ] Other (See Comments)    Patient states she can not take due to kidney   Shellfish Allergy Other (See Comments)    Hx kidney transplant - told not to consume   Lactose Nausea Only   Misc. Sulfonamide Containing Compounds Itching and Other (See Comments)    Hx kidney transplant    Patient Measurements: Height: 5' 2 (157.5 cm) Weight: 89.9 kg (198 lb 3.1 oz) IBW/kg (Calculated) : 50.1 HEPARIN  DW (KG): 70.6  Vital Signs: Temp: 98.6 F (37 C) (09/21 1536) Temp Source: Axillary (09/21 1536) BP: 114/57 (09/21 2027) Pulse Rate: 90 (09/21 2027)  Labs: Recent Labs    11/16/23 2050 11/16/23 2120 11/16/23 2252 11/17/23 0213 11/17/23 2034  HGB 9.2*  --   --  8.7*  --   HCT 29.6*  --   --  28.4*  --   PLT 408*  --   --  366  --   LABPROT  --  14.2  --   --   --   INR  --  1.0  --   --   --   HEPARINUNFRC  --   --   --   --  0.97*  CREATININE 3.99*  --   --  3.97*  --   TROPONINIHS 88*  --  109*  --   --     Estimated Creatinine Clearance: 16.9 mL/min (A) (by C-G formula based on SCr of 3.97 mg/dL (H)).   Assessment: 53 yo with suspected PE. Pharmacy consulted for heparin  gtt while workup is ongoing for thromboembolic event.  Hgb 8.7, Plt 366  Heparin  drip 1150 uts/hr running in L hand PIV with heparin  level 0.97 > goal drawn from foot stick per RN - both UE  are restricted  Goal of Therapy:  Heparin  level 0.3-0.7 units/ml Monitor platelets by anticoagulation protocol: Yes   Plan:  Hold heparin  for 1hr then decrease heparin  drip 900units/hr Daily HL, CBC  F/u s/sx bleeding, Ddimer   Olam Chalk Pharm.D. CPP, BCPS Clinical Pharmacist [phone] 11/17/2023 9:58 PM

## 2023-11-17 NOTE — Progress Notes (Signed)
 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   Latex Itching   Nsaids Other (See Comments)    Hx kidney transplant   Tylenol  [Acetaminophen ] Other (See Comments)    Patient states she can not take due to kidney   Shellfish Allergy Other (See Comments)    Hx kidney transplant - told not to consume   Lactose Nausea Only   Misc. Sulfonamide Containing Compounds Itching and Other (See Comments)    Hx kidney transplant    Patient Measurements: Height: 5' 2 (157.5 cm) Weight: 89.9 kg (198 lb 3.1 oz) IBW/kg (Calculated) : 50.1 HEPARIN  DW (KG): 70.6  Vital Signs: Temp: 98.8 F (37.1 C) (09/21 0810) Temp Source: Oral (09/21 0810) BP: 113/83 (09/21 1021) Pulse Rate: 123 (09/21 1021)  Labs: Recent Labs    11/16/23 2050 11/16/23 2120 11/16/23 2252 11/17/23 0213  HGB 9.2*  --   --  8.7*  HCT 29.6*  --   --  28.4*  PLT 408*  --   --  366  LABPROT  --  14.2  --   --   INR  --  1.0  --   --   CREATININE 3.99*  --   --  3.97*  TROPONINIHS 88*  --  109*  --     Estimated Creatinine Clearance: 16.9 mL/min (A) (by C-G formula based on SCr of 3.97 mg/dL (H)).   Assessment: 53 yo with suspected PE. Pharmacy consulted for heparin  gtt while workup is ongoing for thromboembolic event.  Hgb 8.7, Plt 366  Goal of Therapy:  Heparin  level 0.3-0.7 units/ml Monitor platelets by anticoagulation protocol: Yes   Plan:  Heparin  4200 units IV x1 followed by  Heparin  1150 units/hr 8hr heparin  level Daily HL, CBC   F/u s/sx bleeding, Ddimer  Sharyne Glatter, PharmD, BCCCP Critical Care Clinical Pharmacist 11/17/2023 10:40 AM

## 2023-11-17 NOTE — Progress Notes (Addendum)
 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 Came to ED with DOE, SOB, coughing white-clear, and chills for one day. She vomited once before coming to ICU. In ED, her Temp 101.2 F. She was hypotensive and she was given NS 0.9% 250 cc bolus and started on Levophed , currently on 4 mcg/min. She make urine and denied dysuria, back pain, and hematuria. Denied URTI symptoms, wheezing, CP, rash, abd pain, or diarrhea. Denied smoking, illicit drug use, and alcohol drinking. She is compliant with meds.  Pertinent  Medical History  Additional hx: 10/08/2023: admitted due to acute hypoxic respiratory failure.  She left AMA on 10/10/2023. 10/19/2023, admitted due to hypertensive emergency and pulm edema. Significant Hospital Events: Including procedures, antibiotic start and stop dates in addition to other pertinent events   11/16/2023: ED eval, given Vanco, Cefepime , and Flagyl  11/16/2023: PCCM consulted for ICU admission 9/21 still on pressors.  Diagnostic data in regards to culture data all negative.  Worsening shortness of breath seemingly out of proportion to chest x-ray placed on heated high flow getting echocardiogram  Interim History / Subjective:  Admitted to the intensive care, norepinephrine  dosing improved some, down to 3 mics per minute Glycemic control acceptable, Procalcitonin negative renal function fairly stable, went from 3.99-3.97 bicarbonate has dropped some with mild anion gap metabolic acidosis, white blood cell count  trending in the correct direction from 15.1-11.9, mild hemoglobin drop from 9.2-8.7 Urine strep negative, of note BNP was 877 on admission Reporting increased shortness of breath Objective    Blood pressure (!) 107/35, pulse (!) 111, temperature 98.8 F (37.1 C), temperature source Oral, resp. rate (!) 28, height 5' 2 (1.575 m), weight 89.9 kg, SpO2 93%.        Intake/Output Summary (Last 24 hours) at 11/17/2023 0905 Last data filed at 11/17/2023 0700 Gross per 24 hour  Intake 1154.57 ml  Output 550 ml  Net 604.57 ml   Filed Weights   11/16/23 1914 11/17/23 0500  Weight: 89.1 kg 89.9 kg    Examination: General 53 year old female patient sitting upright in bed endorsing worsening shortness of breath HEENT normocephalic atraumatic no obvious jugular venous distention Pulmonary crackles bibasilar.  Currently 2 L endorses dyspnea saturations 96% Cardiac regular rate and rhythm Abdomen soft nontender Extremities does have pitting edema, bilateral UE grafts Neuro intact, she is anxious.  Resolved problem list   Assessment and Plan  Septic shock, ?PNA, ?UTI Culture to date has been negative White blood cell trending down, MRSA screen negative Plan Day #2 azithromycin  and ceftriaxone  Follow-up urine Legionella antigen Follow-up culture data Wean norepinephrine  for mean arterial pressure greater than 65 Repeat lactic acid Echocardiogram today, last echocardiogram in August showed EF 50 to 55% Stress dose steroids  Acute respiratory failure with worsening dyspnea.  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 chemistry  HTN, dyslipidemia, CAD, D-CHF (10/10/2023, EF 50-55%, G2DD) Plan Hold BP meds Continue ASA, Plavix  and statin    Anemia of chronic illness Plan Continue to monitor  DM-2 (HbA1c 8.6%) Plan Sliding scale insulin  Goal 140 to 180   Critical care time:  32 min    Maude FORBES Banner ACNP-BC [facility] Pulmonary/Critical Care Pager # [phone] OR # [phone] if no answer

## 2023-11-17 NOTE — Progress Notes (Addendum)
 eLink Physician-Brief Progress Note Patient Name: Morgan [name] DOB: 1970/09/29 MRN: [id number]   Date of Service  11/17/2023  HPI/Events of Note  CCM note reviewed. Seen on camera. DW RN. Patient comfortable on camera. Has levophed  infusing at 4 mic/min via hand IV since admission. Doing ok on this dose.   eICU Interventions  Continue antibiotics and levophed  to keep map > 65 Follow AM labs - ok for phlebotomy to stick patient for labs in AM - have d/w RN Call E link if needed      Intervention Category Major Interventions: Shock - evaluation and management Evaluation Type: New Patient Evaluation  Kinsley Nicklaus G Melaysia Streed 11/17/2023, 1:05 AM  5 am - Notified of an episode of chest tightness and SOB. RN had done an EKG and called us . Very comfortable on camera. Stable vitals. No distress. EKG with no acute changes. Labs pending. Has duonebs ordered so trying that

## 2023-11-17 NOTE — Progress Notes (Signed)
 Pt placed on HHFNC for increased WOB. Pt initially tolerated and requested increased flow. Flow incareased from 20lpm to 25lpm. Pt then requested to have flow turned back down. Pt placed back on 20 lpm @ 30% fio2. Pt only able to tolerate HHFNC ~ 10 minutes before requesting to go back on Mylo. RN and NP notified.

## 2023-11-17 NOTE — Progress Notes (Signed)
 eLink Physician-Brief Progress Note Patient Name: Morgan [name] DOB: 05/11/1970 MRN: [id number]   Date of Service  11/17/2023  HPI/Events of Note  Bedside RN reports patient on Bipap and unable to take PO medications. Currently has Bicarb and Prograf  scheduled that she can't tolerate coming off BIpap currently.   eICU Interventions  No PO alternative for Prograf  and concern for fluid overload if we were to shift to bicarb drip Discussed with clinical pharmacy     Intervention Category Intermediate Interventions: Other:  Damien ONEIDA Grout 11/17/2023, 10:37 PM

## 2023-11-17 NOTE — Progress Notes (Signed)
 Attempted heated high flow, she was unable to tolerate.  Looked at her chest x-ray, did not really see any significant work sitting on her Film.  In spite of of this she appears to be becoming progressively more short of breath with worsening anxiety, she is coughing up frequent frothy white sputum.  Bedside echocardiogram was difficult to evaluate due to body habitus and anxiety however the left ventricle looks dilated.  Unfortunately we have none of the stat labs back, and her clinical status seems to be rapidly deteriorating Plan 1.  IV Lasix  now 2.  Will empirically started on IV heparin  to cover for acute pulmonary emboli 3.  Starting her on Precedex , low-dose.  I think she is going to need this in order to attempt noninvasive 4.  If no improvement in the next hour she is going to require intubation

## 2023-11-17 NOTE — Progress Notes (Signed)
 Pt acutely desatted on [place]. Pt placed back on HHFNC 30L @ 100% w/ sats increasing to 100%. Pt continues to have increased WOB and prod cough of thick frothy pink secs. Pt remained on HHFNC until placed on Servo NIV at documented settings. Pt tolerating well at this time.

## 2023-11-17 NOTE — Progress Notes (Signed)
 She has been on noninvasive now for about 45 minutes.  Does appear as though her work of breathing has improved some.  She seems comfortable on Precedex  Reveal labs: Lactate 1.5 D-dimer only mildly elevated at 0.75 BNP up to 1208 Plan Continue IV heparin  Continue noninvasive ventilation Lower extremity Dopplers Echo If declines intubated, at which time would place central line and get Co. oximetry

## 2023-11-18 ENCOUNTER — Inpatient Hospital Stay (HOSPITAL_COMMUNITY)

## 2023-11-18 DIAGNOSIS — M7989 Other specified soft tissue disorders: Secondary | ICD-10-CM

## 2023-11-18 DIAGNOSIS — R578 Other shock: Secondary | ICD-10-CM | POA: Diagnosis not present

## 2023-11-18 LAB — ECHOCARDIOGRAM COMPLETE
AR max vel: 2.52 cm2
AV Area VTI: 2.49 cm2
AV Area mean vel: 2.51 cm2
AV Mean grad: 4 mmHg
AV Peak grad: 7.1 mmHg
Ao pk vel: 1.33 m/s
Area-P 1/2: 5.6 cm2
Calc EF: 51.3 %
Height: 62 in
MV VTI: 1.9 cm2
S' Lateral: 3.4 cm
Single Plane A2C EF: 39.8 %
Single Plane A4C EF: 54.4 %
Weight: 3068.8 [oz_av]

## 2023-11-18 LAB — COMPREHENSIVE METABOLIC PANEL WITH GFR
ALT: 18 U/L (ref 0–44)
AST: 17 U/L (ref 15–41)
Albumin: 2.8 g/dL — ABNORMAL LOW (ref 3.5–5.0)
Alkaline Phosphatase: 57 U/L (ref 38–126)
Anion gap: 17 — ABNORMAL HIGH (ref 5–15)
BUN: 72 mg/dL — ABNORMAL HIGH (ref 6–20)
CO2: 15 mmol/L — ABNORMAL LOW (ref 22–32)
Calcium: 9.2 mg/dL (ref 8.9–10.3)
Chloride: 107 mmol/L (ref 98–111)
Creatinine, Ser: 5.24 mg/dL — ABNORMAL HIGH (ref 0.44–1.00)
GFR, Estimated: 9 mL/min — ABNORMAL LOW (ref >60)
Glucose, Bld: 185 mg/dL — ABNORMAL HIGH (ref 70–99)
Potassium: 4.3 mmol/L (ref 3.5–5.1)
Sodium: 139 mmol/L (ref 135–145)
Total Bilirubin: 1 mg/dL (ref 0.0–1.2)
Total Protein: 6.2 g/dL — ABNORMAL LOW (ref 6.5–8.1)

## 2023-11-18 LAB — CBC
HCT: 26.1 % — ABNORMAL LOW (ref 36.0–46.0)
Hemoglobin: 8.2 g/dL — ABNORMAL LOW (ref 12.0–15.0)
MCH: 26.9 pg (ref 26.0–34.0)
MCHC: 31.4 g/dL (ref 30.0–36.0)
MCV: 85.6 fL (ref 80.0–100.0)
Platelets: 333 K/uL (ref 150–400)
RBC: 3.05 MIL/uL — ABNORMAL LOW (ref 3.87–5.11)
RDW: 14.2 % (ref 11.5–15.5)
WBC: 10.9 K/uL — ABNORMAL HIGH (ref 4.0–10.5)
nRBC: 0 % (ref 0.0–0.2)

## 2023-11-18 LAB — GLUCOSE, CAPILLARY
Glucose-Capillary: 155 mg/dL — ABNORMAL HIGH (ref 70–99)
Glucose-Capillary: 157 mg/dL — ABNORMAL HIGH (ref 70–99)
Glucose-Capillary: 184 mg/dL — ABNORMAL HIGH (ref 70–99)
Glucose-Capillary: 199 mg/dL — ABNORMAL HIGH (ref 70–99)
Glucose-Capillary: 204 mg/dL — ABNORMAL HIGH (ref 70–99)
Glucose-Capillary: 232 mg/dL — ABNORMAL HIGH (ref 70–99)

## 2023-11-18 LAB — CALCIUM, IONIZED: Calcium, Ionized, Serum: 5.2 mg/dL (ref 4.5–5.6)

## 2023-11-18 LAB — HEPARIN LEVEL (UNFRACTIONATED): Heparin Unfractionated: 0.34 [IU]/mL (ref 0.30–0.70)

## 2023-11-18 MED ORDER — INSULIN ASPART 100 UNIT/ML IJ SOLN
0.0000 [IU] | INTRAMUSCULAR | Status: DC
  Administered 2023-11-18 (×2): 3 [IU] via SUBCUTANEOUS
  Administered 2023-11-18: 5 [IU] via SUBCUTANEOUS
  Administered 2023-11-18: 3 [IU] via SUBCUTANEOUS
  Administered 2023-11-18: 1 [IU] via SUBCUTANEOUS
  Administered 2023-11-19 (×2): 5 [IU] via SUBCUTANEOUS

## 2023-11-18 MED ORDER — HYDROCORTISONE SOD SUC (PF) 100 MG IJ SOLR
100.0000 mg | Freq: Two times a day (BID) | INTRAMUSCULAR | Status: DC
  Administered 2023-11-18 – 2023-11-20 (×4): 100 mg via INTRAVENOUS
  Filled 2023-11-18 (×4): qty 2

## 2023-11-18 MED ORDER — SODIUM BICARBONATE 650 MG PO TABS
1300.0000 mg | ORAL_TABLET | Freq: Three times a day (TID) | ORAL | Status: DC
Start: 2023-11-18 — End: 2023-11-23
  Administered 2023-11-18 – 2023-11-23 (×14): 1300 mg via ORAL
  Filled 2023-11-18 (×14): qty 2

## 2023-11-18 MED ORDER — FUROSEMIDE 10 MG/ML IJ SOLN
100.0000 mg | Freq: Two times a day (BID) | INTRAVENOUS | Status: AC
  Administered 2023-11-18 – 2023-11-19 (×2): 100 mg via INTRAVENOUS
  Filled 2023-11-18 (×2): qty 10

## 2023-11-18 MED ORDER — PREDNISONE 10 MG PO TABS: 5.0000 mg | ORAL_TABLET | Freq: Every day | ORAL | Status: DC

## 2023-11-18 MED ORDER — NOREPINEPHRINE 4 MG/250ML-% IV SOLN
0.0000 ug/min | INTRAVENOUS | Status: DC
  Administered 2023-11-19: 4 ug/min via INTRAVENOUS
  Filled 2023-11-18: qty 250

## 2023-11-18 MED ORDER — PERFLUTREN LIPID MICROSPHERE
1.0000 mL | INTRAVENOUS | Status: AC | PRN
  Administered 2023-11-18: 2 mL via INTRAVENOUS

## 2023-11-18 NOTE — Plan of Care (Signed)
  Problem: Clinical Measurements: Goal: Ability to maintain clinical measurements within normal limits will improve Outcome: Progressing Goal: Will remain free from infection Outcome: Progressing Goal: Diagnostic test results will improve Outcome: Progressing Goal: Respiratory complications will improve Outcome: Progressing Goal: Cardiovascular complication will be avoided Outcome: Progressing   Problem: Coping: Goal: Level of anxiety will decrease Outcome: Progressing   Problem: Pain Managment: Goal: General experience of comfort will improve and/or be controlled Outcome: Progressing   Problem: Safety: Goal: Ability to remain free from injury will improve Outcome: Progressing   Problem: Nutrition: Goal: Adequate nutrition will be maintained Outcome: Not Progressing

## 2023-11-18 NOTE — Progress Notes (Signed)
 BLE venous duplex has been completed.   Results can be found under chart review under CV PROC. 11/18/2023 12:29 PM Jamare Vanatta RVT, RDMS

## 2023-11-18 NOTE — Inpatient Diabetes Management (Signed)
 Inpatient Diabetes Program Recommendations  AACE/ADA: New Consensus Statement on Inpatient Glycemic Control (2015)  Target Ranges:  Prepandial:   less than 140 mg/dL      Peak postprandial:   less than 180 mg/dL (1-2 hours)      Critically ill patients:  140 - 180 mg/dL    Latest Reference Range & Units 10/20/23 09:08  Hemoglobin A1C 4.8 - 5.6 % 8.6 (H)  (H): Data is abnormally high  Latest Reference Range & Units 11/17/23 00:54 11/17/23 03:17 11/17/23 08:09 11/17/23 11:33 11/17/23 15:35 11/17/23 20:02  Glucose-Capillary 70 - 99 mg/dL 845 (H) 850 (H) 859 (H) 225 (H) 204 (H) 186 (H)  (H): Data is abnormally high  Latest Reference Range & Units 11/17/23 23:35 11/18/23 03:49  Glucose-Capillary 70 - 99 mg/dL 792 (H) 795 (H)  (H): Data is abnormally high   Admit with: Septic shock, ?PNA, ?UTI/ Acute respiratory failure with worsening dyspnea   History: DM, CKD, Renal Transplant 2018, CHF  Home DM Meds: Tresiba  15 units BID        Novolog  8-13 units TID per SSI        Mounjaro 7.5 mg Qweek        Farxiga  10 mg daily  Current Orders: Novolog  Sensitive Correction Scale/ SSI (0-9 units) Q4 hours    Getting Solucortef 100 mg BID Bipap Currently NPO   MD- Note pt getting Steroids.  Takes Tresiba  insulin  14-15 units BID at home.  Please consider starting 50% home dose basal insulin : Lantus  8 units BID    ENDO: Lamarr Kocher, NP with [facility] Last Seen 10/17/2023 No changes made to regimen: Humalog /Novolog  8 units immediately before each meal. Correction scale given as well: 180-230- 2 units extra (Novolog /Humalog ) 230-280- 4 units extra 280-330- 6 units extra  ETC  Tresiba  14 units twice daily.  Mounjaro 7.5/week  Farxiga  10 mg (prescribed by renal team)     --Will follow patient during hospitalization--  Adina Rudolpho Arrow RN, MSN, CDCES Diabetes Coordinator Inpatient Glycemic Control Team Team Pager: [phone] (8a-5p)

## 2023-11-18 NOTE — Consult Note (Signed)
 Nephrology Consult   Reason for consult: AKI, renal transplant   Assessment/Recommendations:   AKI on CKD 5, unstable, oliguric -followed by Dr. Tobie ([facility]) outpatient. 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 Failed lasix  challenge 9/21 (lasix  80mg  IV x 1 dose). Will reattempt -checking renal transplant doppler, repeat UA w/ microscopy -likely heading towards re-initiation of renal replacement therapy at this junction given oliguria. No acute indications for dialysis tonight but I am suspecting that she will need initiation in the 48 hours or so. Discussed with patient and she is accepting of this. . -Avoid nephrotoxic medications including NSAIDs and iodinated intravenous contrast exposure unless the latter is absolutely indicated.  Preferred narcotic agents for pain control are hydromorphone , fentanyl , and methadone. Morphine  should not be used. Avoid Baclofen and avoid oral sodium phosphate and magnesium citrate based laxatives / bowel preps. Continue strict Input and Output monitoring. Will monitor the patient closely with you and intervene or adjust therapy as indicated by changes in clinical status/labs   DDKT -s/p transplant @ [facility] 04/28/2016--see above. Checking transplant u/s -[facility] transplant already contacted by primary service  Immunosuppression: -High Risk Medical Decision Making For Drug Therapy Requiring Intensive Monitoring For Toxicity -I/S levels: FK level pending 9/22 -Will continue intensive monitoring for drug levels and toxicity via regularly scheduled laboratory assays given the narrow therapeutic index of the immunosuppressive drug therapy listed above -mycophenolate  on hold given sepsis--agree -continue with  home tacrolimus . On solucortef, wean towards home pred dose of 5mg  daily  Septic shock History of HTN -pressor support per CCM (currently on levo 2mcg). Home BP meds on hold -on midodrine  5mg  TID (start 9/21) -on stress dose solucortef -MMF on hold as above  AHRF Possible pneumonia Possible PE -on HFNC and abx per CCM -dopplers negative for DVT, currently on heparin  gtt  AGMA -secondary to AKI. Lactate WNL -on PO bicarb 1300mg  BID. Bicarb stable. Increasing PO regimen to TID  History of Diastolic CHF -2nd lasix  challenge as above: will try for lasix  100mg  IV BID x 1 day -EF 60-65%, seems that she has normal right sided pressures on TTE  9/22  Anemia due to chronic disease -Transfuse for Hgb<7 g/dL -No IV iron for now in the context of sepsis -will check iron stores  Uncontrolled Diabetes Mellitus Type 2 with Hyperglycemia -per primary service  Hypoalbuminemia -consider RD consult  Vascular acces -has AVGs bilateral Ue's. She has a strong B/T in her RUE which would be the point of access. Hopefully can keep her away from a catheter (unless she needs CRRT) -recommend RUE precautions   Recommendations conveyed to primary service. Discussed with RN at the bedside.   The patient is critically ill with AKI, anion gap metabolic acidosis, acute hypoxic respiratory failure, septic shock and which includes my role to primarily manage AKI and metabolic acidosis.  This requires high complexity decision making.  Total critical care time: 67 minutes    Critical care time was exclusive of treating other patients.   Critical care was necessary to treat or prevent imminent or life-threatening deterioration.   Critical care was time spent personally by me on the following activities:   development of treatment plan with patient and/or surrogate as well as nursing,   discussions with other provider evaluation  of patient's response to treatment  examination of patient  obtaining  history from patient or surrogate  ordering and performing treatments and interventions  ordering and review of laboratory studies  ordering and review of radiographic studies    Ephriam Stank [facility] 11/18/2023 8:08 PM   _____________________________________________________________________________________   History of Present Illness: Morgan [name] is a/an 53 y.o. female with a past medical history of CKD 5, DDKT 2018, hypertension, dyslipidemia, chronic anemia, DM2, diastolic CHF, CAD, history of CVA who presents initially with productive cough, shortness of breath, chills.  Found to have a temp of 101.2.  She was hypotensive and was given small isotonic fluid bolus and subsequently started on Levophed  therefore transferred to ICU.  She was turned on broad-spectrum antibiotics initially, now on azithro and rocephin .  She did receive Lasix  challenge yesterday for possible vascular congestion, made 550 cc of urine only.  Today, her creatinine was up to 5.24. Patient seen and examined bedside. She does report that her breathing is a bit better. Conversing with me on RA and maintaining saturations at around 97-98%. She does endorse nausea and loss of appetite, questionable dysgeusia. Does report some chest pain as well, feels like gas.   Medications:  Current Facility-Administered Medications  Medication Dose Route Frequency Provider Last Rate Last Admin   aspirin  EC tablet 81 mg  81 mg Oral Daily Albustami, Omar M, MD   81 mg at 11/18/23 1034   azithromycin  (ZITHROMAX ) tablet 500 mg  500 mg Oral Daily Babcock, Peter E, NP   500 mg at 11/18/23 1033   cefTRIAXone  (ROCEPHIN ) 2 g in sodium chloride  0.9 % 100 mL IVPB  2 g Intravenous Daily Merilee Linsey I, [facility]   Stopped at 11/18/23 1125   Chlorhexidine  Gluconate Cloth 2 % PADS 6 each  6 each Topical Daily Albustami, Omar M, MD   6 each at 11/17/23 9157   clopidogrel  (PLAVIX ) tablet 75 mg  75 mg Oral Daily  Dub Mancel HERO, MD   75 mg at 11/18/23 1033   docusate sodium  (COLACE) capsule 100 mg  100 mg Oral BID PRN Dub Mancel HERO, MD       heparin  ADULT infusion 100 units/mL (25000 units/250mL)  900 Units/hr Intravenous Continuous Layman Raisin, DO 9 mL/hr at 11/18/23 1200 900 Units/hr at 11/18/23 1200   hydrocortisone  sodium succinate  (SOLU-CORTEF ) 100 MG injection 100 mg  100 mg Intravenous Q12H Gleason, Laura R, PA-C   100 mg at 11/18/23 1821   insulin  aspart (novoLOG ) injection 0-15 Units  0-15 Units Subcutaneous Q4H Gleason, Laura R, PA-C   3 Units at 11/18/23 1720   ipratropium-albuterol  (DUONEB) 0.5-2.5 (3) MG/3ML nebulizer solution 3 mL  3 mL Nebulization Q6H PRN Dub Mancel HERO, MD       midodrine  (PROAMATINE ) tablet 5 mg  5 mg Oral TID WC Albustami, Omar M, MD   5 mg at 11/18/23 1720   norepinephrine  (LEVOPHED ) 4mg  in (0.016 mg/mL) premix infusion  0-10 mcg/min Intravenous Titrated Gleason, Leita SAUNDERS, PA-C 3.75 mL/hr at 11/18/23 1219 1 mcg/min at 11/18/23 1219   pantoprazole  (PROTONIX ) EC tablet 40 mg  40 mg Oral Daily Babcock, Peter E, NP   40 mg at 11/18/23 1034   polyethylene glycol (MIRALAX  / GLYCOLAX ) packet 17 g  17 g Oral Daily PRN Albustami, Omar M, MD       rosuvastatin  (CRESTOR ) tablet 10 mg  10 mg Oral Daily Albustami, Omar M, MD   10 mg at 11/18/23  1033   sodium bicarbonate  tablet 1,300 mg  1,300 mg Oral BID Albustami, Omar M, MD   1,300 mg at 11/18/23 1033   tacrolimus  (PROGRAF ) capsule 3 mg  3 mg Oral Daily Babcock, Peter E, NP   3 mg at 11/18/23 1033   And   tacrolimus  (PROGRAF ) capsule 4 mg  4 mg Oral QHS Babcock, Peter E, NP         ALLERGIES Fluorescite [fluorescein]; Iodinated contrast media; Iohexol ; Egg solids, whole; Latex; Nsaids; Tylenol  [acetaminophen ]; Shellfish allergy; Lactose; and Misc. sulfonamide containing compounds  MEDICAL HISTORY Past Medical History:  Diagnosis Date   Acute respiratory failure with hypoxia (HCC) 10/09/2023   Anemia     low iron   CKD (chronic kidney disease) stage V requiring chronic dialysis (HCC) 05/08/2012   Constipation    when given iron   Coronary artery disease    CVA (cerebral vascular accident) (HCC) 05/12/2021   Diabetes mellitus without complication (HCC)    type 2   Diabetic retinopathy (HCC)    ESRD on hemodialysis (HCC) 06/05/2014   H/O detached retina repair    bilateral   History of CVA (cerebrovascular accident)    Hyperlipidemia    Hypertension    Insulin  overdose 05/08/2012   Kidney transplanted    Pneumonia    PVD (peripheral vascular disease)    S/P drug eluting coronary stent placement 08/24/2021   AT [facility] Dr [facility]   Suicide attempt [facility] 05/08/2012     SOCIAL HISTORY Social History   Socioeconomic History   Marital status: Married    Spouse name: Not on file   Number of children: Not on file   Years of education: Not on file   Highest education level: Not on file  Occupational History   Not on file  Tobacco Use   Smoking status: Never   Smokeless tobacco: Never  Vaping Use   Vaping status: Never Used  Substance and Sexual Activity   Alcohol use: No    Alcohol/week: 0.0 standard drinks of alcohol   Drug use: No   Sexual activity: Not on file  Other Topics Concern   Not on file  Social History Narrative   L handed   Social Drivers of Health   Financial Resource Strain: Low Risk  (09/18/2023)   Received from [facility]   Overall Financial Resource Strain (CARDIA)    Difficulty of Paying Living Expenses: Not very hard  Food Insecurity: Patient Declined (10/09/2023)   Hunger Vital Sign    Worried About Running Out of Food in the Last Year: Patient declined    Ran Out of Food in the Last Year: Patient declined  Transportation Needs: Patient Declined (10/09/2023)   PRAPARE - Administrator, Civil Service (Medical): Patient declined    Lack of Transportation (Non-Medical): Patient declined  Physical Activity:  Insufficiently Active (06/21/2020)   Received from [facility]   Exercise Vital Sign    On average, how many days per week do you engage in moderate to strenuous exercise (like a brisk walk)?: 3 days    On average, how many minutes do you engage in exercise at this level?: 30 min  Stress: Stress Concern Present (06/21/2020)   Received from [facility] Occup[facility] - Occupational Stress Questionnaire    Feeling of Stress : Rather much  Social Connections: Not on file  Intimate Partner Violence: Patient Declined (10/09/2023)  Humiliation, Afraid, Rape, and Kick questionnaire    Fear of Current or Ex-Partner: Patient declined    Emotionally Abused: Patient declined    Physically Abused: Patient declined    Sexually Abused: Patient declined     FAMILY HISTORY Family History  Problem Relation Age of Onset   Diabetes Maternal Grandmother    Gout Maternal Uncle      Review of Systems: 12 systems reviewed Otherwise as per HPI, all other systems reviewed and negative  Physical Exam: Vitals:   11/18/23 1800 11/18/23 1815  BP: (!) 89/70   Pulse: (!) 102 100  Resp: (!) 23 19  Temp:    SpO2: 96% 96%   No intake/output data recorded.  Intake/Output Summary (Last 24 hours) at 11/18/2023 2008 Last data filed at 11/18/2023 1600 Gross per 24 hour  Intake 500.33 ml  Output 175 ml  Net 325.33 ml   General: well-appearing, no acute distress HEENT: anicteric sclera, oropharynx clear without lesions CV: regular rate, normal rhythm, no murmurs, no gallops, no rubs Lungs: decreased breath sounds bibasilar, no overt w/r/r/c appreciated, unlabored/normal WOB Abd: soft, non-tender, non-distended Skin: no visible lesions or rashes Psych: alert, engaged, appropriate mood and affect Musculoskeletal: no obvious deformities Neuro: normal speech, no gross focal deficits, no myoclonic jerks observed Dialysis access: LUE AVG with faint  b/t, URE AVG +b/t   Test Results Reviewed Lab Results  Component Value Date   NA 139 11/18/2023   K 4.3 11/18/2023   CL 107 11/18/2023   CO2 15 (L) 11/18/2023   BUN 72 (H) 11/18/2023   CREATININE 5.24 (H) 11/18/2023   CALCIUM  9.2 11/18/2023   ALBUMIN  2.8 (L) 11/18/2023   PHOS 3.1 11/17/2023     I have reviewed all relevant outside healthcare records related to the patient's kidney injury.

## 2023-11-18 NOTE — Progress Notes (Signed)
 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 Came to ED with DOE, SOB, coughing white-clear, and chills for one day. She vomited once before coming to ICU. In ED, her Temp 101.2 F. She was hypotensive and she was given NS 0.9% 250 cc bolus and started on Levophed , currently on 4 mcg/min. She make urine and denied dysuria, back pain, and hematuria. Denied URTI symptoms, wheezing, CP, rash, abd pain, or diarrhea. Denied smoking, illicit drug use, and alcohol drinking. She is compliant with meds.  Pertinent  Medical History  Additional hx: 10/08/2023: admitted due to acute hypoxic respiratory failure.  She left AMA on 10/10/2023. 10/19/2023, admitted due to hypertensive emergency and pulm edema. Significant Hospital Events: Including procedures, antibiotic start and stop dates in addition to other pertinent events   11/16/2023: ED eval, given Vanco, Cefepime , and Flagyl  11/16/2023: PCCM consulted for ICU admission 9/21 still on pressors.  Diagnostic data in regards to culture data all negative.  Worsening shortness of breath seemingly out of proportion to chest x-ray placed on heated high flow getting echocardiogram 9/22 respiratory status improved, HFNC down from 40L to 20L, still on levo 1mcg  Interim History / Subjective:  No acute events overnight and feels much better today, she is sitting up and conversing, O2 requirement down from 40L to 20L, pt is a difficult blood draw and labs are pending  500cc UOP after lasix  80mg   yesterday  Objective    Blood pressure (!) 94/44, pulse 87, temperature 98.1 F (36.7 C), temperature source Oral, resp. rate 20, height 5' 2 (1.575 m), weight 87 kg, SpO2 100%.    Vent Mode: PCV FiO2 (%):  [40 %] 40 % Set Rate:  [20 bmp] 20 bmp PEEP:  [5 cmH20-8 cmH20] 5 cmH20   Intake/Output Summary (Last 24 hours) at 11/18/2023 1211 Last data filed at 11/18/2023 1200 Gross per 24 hour  Intake 828.15 ml  Output 600 ml  Net 228.15 ml   Filed Weights   11/16/23 1914 11/17/23 0500 11/18/23 0440  Weight: 89.1 kg 89.9 kg 87 kg     General:  well nourished, non-toxic appearing F sitting up in bed in NAD  HEENT: MM pink/moist Neuro: alert and oriented, moving all extremities  CV: s1s2 rrr, no m/r/g PULM:  few scattered crackles in the bilateral bases, no tachypnea or accessory muscle use on HFNC GI: soft, non-tender to palpation Extremities: warm/dry, no edema,  bilateral UE grafts  Labs: Pending    Resolved problem list   Assessment and Plan    Septic shock, possible pneumonia  Culture to date has been negative White blood cell count continues to trend down, MRSA screen negative -continue Day #3 azithromycin  and ceftriaxone  for one week total  -follow-up urine Legionella antigen -BC NGTD and viral panel negative  -on levo 1mcg. Maintain MAP >65 -Echocardiogram pending, last echocardiogram in August showed EF 50 to 55% -continue stress dose steroids  Acute respiratory failure with worsening dyspnea.   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 Plan -Renal dose medications -Continue home bicarbonate -continue stress dose steroids, hold cell-cept while on abx for  acute infection -continue tacrolimus , check tac level -discussed with patient and attending MD, if renal function worsening will contact [facility] regarding transfer to her transplant center   HTN, dyslipidemia, CAD, D-CHF (10/10/2023, EF 50-55%, G2DD) Plan -Hold BP meds -Continue ASA, Plavix  and statin    Anemia of chronic illness Plan -Continue to monitor  DM-2 (HbA1c 8.6%) Plan -Sliding scale insulin  -Goal 140 to 180  -carb modified diet  Critical care time:  45 min     CRITICAL CARE Performed by: Leita SAUNDERS Aqeel Norgaard   Total critical care time: 45 minutes  Critical care time was exclusive of separately billable procedures and treating other patients.  Critical care was necessary to treat or prevent imminent or life-threatening deterioration.  Critical care was time spent personally by me on the following activities: development of treatment plan with patient and/or surrogate as well as nursing, discussions with consultants, evaluation of patient's response to treatment, examination of patient, obtaining history from patient or surrogate, ordering and performing treatments and interventions, ordering and review of laboratory studies, ordering and review of radiographic studies, pulse oximetry and re-evaluation of patient's condition.     Leita SAUNDERS Brantley Naser, PA-C [facility] See Amion for pager If no response to pager , please call [phone] until 7pm After 7:00 pm call Elink  [phone]

## 2023-11-18 NOTE — TOC CM/SW Note (Signed)
 Transition of Care [facility]) - Inpatient Brief Assessment   Patient Details  Name: Morgan [name] MRN: [id number] Date of Birth: 1971-01-10  [facility] CM/SW Contact:    Tom-Johnson, Harvest Muskrat, RN Phone Number: 11/18/2023, 3:21 PM   Clinical Narrative:  Patient presented to the ED with Shortness of breath, Coughing, Chills. Temp in ED was 101.2. Admitted with Sepsis, on IV and Oral abx. Currently on Heparin  gtt for Pulmonary Embolus, HHFNC 30L.  Patient has hx of CKD 5 s/p Renal Transplant in 2018, T2DM, Anemia, HTN, CAD.  CM spoke with patient and husband, Jerona at bedside about needs for post hospital transition. Patient lives with husband, has one supportive daughter who is currently in [place] and will return in October unless if needed. Mother and one brother also supportive.  Driving is limited, husband drives most of the time. Has a cane and shower seat at home.   PCP is Sanoff, Glendia Ditch, MD and uses [facility] on [address]   Patient not Medically ready for discharge.  CM will continue to follow as patient progresses with care towards discharge.          Transition of Care Asessment: Insurance and Status: Insurance coverage has been reviewed Patient has primary care physician: Yes Home environment has been reviewed: Yes Prior level of function:: Modified [employer] Prior/Current Home Services: No current home services Social Drivers of Health Review: SDOH reviewed no interventions necessary Readmission risk has been reviewed: Yes Transition of care needs: no transition of care needs at this time

## 2023-11-18 NOTE — Plan of Care (Signed)
  Problem: Education: Goal: Knowledge of General Education information will improve Description: Including pain rating scale, medication(s)/side effects and non-pharmacologic comfort measures Outcome: Progressing   Problem: Health Behavior/Discharge Planning: Goal: Ability to manage health-related needs will improve Outcome: Progressing   Problem: Clinical Measurements: Goal: Ability to maintain clinical measurements within normal limits will improve Outcome: Progressing Goal: Will remain free from infection Outcome: Progressing Goal: Diagnostic test results will improve Outcome: Progressing Goal: Respiratory complications will improve Outcome: Progressing   Problem: Coping: Goal: Level of anxiety will decrease Outcome: Progressing   Problem: Elimination: Goal: Will not experience complications related to urinary retention Outcome: Progressing   Problem: Pain Managment: Goal: General experience of comfort will improve and/or be controlled Outcome: Progressing   Problem: Safety: Goal: Ability to remain free from injury will improve Outcome: Progressing   Problem: Coping: Goal: Ability to adjust to condition or change in health will improve Outcome: Progressing   Problem: Health Behavior/Discharge Planning: Goal: Ability to identify and utilize available resources and services will improve Outcome: Progressing Goal: Ability to manage health-related needs will improve Outcome: Progressing   Problem: Metabolic: Goal: Ability to maintain appropriate glucose levels will improve Outcome: Progressing   Problem: Skin Integrity: Goal: Risk for impaired skin integrity will decrease Outcome: Progressing

## 2023-11-18 NOTE — Progress Notes (Signed)
 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   Latex Itching   Nsaids Other (See Comments)    Hx kidney transplant   Tylenol  [Acetaminophen ] Other (See Comments)    Patient states she can not take due to kidney   Shellfish Allergy Other (See Comments)    Hx kidney transplant - told not to consume   Lactose Nausea Only   Misc. Sulfonamide Containing Compounds Itching and Other (See Comments)    Hx kidney transplant    Patient Measurements: Height: 5' 2 (157.5 cm) Weight: 87 kg (191 lb 12.8 oz) IBW/kg (Calculated) : 50.1 HEPARIN  DW (KG): 70.6  Vital Signs: Temp: 98.1 F (36.7 C) (09/22 1104) Temp Source: Oral (09/22 1104) BP: 99/58 (09/22 0820) Pulse Rate: 79 (09/22 0820)  Labs: Recent Labs    11/16/23 2050 11/16/23 2120 11/16/23 2252 11/17/23 0213 11/17/23 2034 11/18/23 1039  HGB 9.2*  --   --  8.7*  --  8.2*  HCT 29.6*  --   --  28.4*  --  26.1*  PLT 408*  --   --  366  --  333  LABPROT  --  14.2  --   --   --   --   INR  --  1.0  --   --   --   --   HEPARINUNFRC  --   --   --   --  0.97* 0.34  CREATININE 3.99*  --   --  3.97*  --   --   TROPONINIHS 88*  --  109*  --   --   --     Estimated Creatinine Clearance: 16.6 mL/min (A) (by C-G formula based on SCr of 3.97 mg/dL (H)).   Assessment: 53 yo with suspected PE. Pharmacy consulted for heparin  gtt while workup is ongoing for thromboembolic event.  Hgb 8.2, Plt 333 HL 0.34 - therapeutic   Goal of Therapy:  Heparin  level 0.3-0.7  units/ml Monitor platelets by anticoagulation protocol: Yes   Plan:  Continue heparin  drip at 900 units/hr Daily HL, CBC  F/u s/sx bleeding, Ddimer  Sharyne Glatter, PharmD, BCCCP Critical Care Clinical Pharmacist 11/18/2023 11:53 AM

## 2023-11-19 ENCOUNTER — Inpatient Hospital Stay (HOSPITAL_COMMUNITY)

## 2023-11-19 LAB — URINALYSIS, COMPLETE (UACMP) WITH MICROSCOPIC
Bilirubin Urine: NEGATIVE
Glucose, UA: 50 mg/dL — AB
Ketones, ur: 5 mg/dL — AB
Leukocytes,Ua: NEGATIVE
Nitrite: NEGATIVE
Protein, ur: >=300 mg/dL — AB
RBC / HPF: >50 RBC/hpf (ref 0–5)
Specific Gravity, Urine: 1.016 (ref 1.005–1.030)
pH: 5 (ref 5.0–8.0)

## 2023-11-19 LAB — CBC
HCT: 27.2 % — ABNORMAL LOW (ref 36.0–46.0)
Hemoglobin: 8.8 g/dL — ABNORMAL LOW (ref 12.0–15.0)
MCH: 27.2 pg (ref 26.0–34.0)
MCHC: 32.4 g/dL (ref 30.0–36.0)
MCV: 84 fL (ref 80.0–100.0)
Platelets: 464 K/uL — ABNORMAL HIGH (ref 150–400)
RBC: 3.24 MIL/uL — ABNORMAL LOW (ref 3.87–5.11)
RDW: 14.3 % (ref 11.5–15.5)
WBC: 16.2 K/uL — ABNORMAL HIGH (ref 4.0–10.5)
nRBC: 0.3 % — ABNORMAL HIGH (ref 0.0–0.2)

## 2023-11-19 LAB — IRON AND TIBC
Iron: 92 ug/dL (ref 28–170)
Saturation Ratios: 34 % — ABNORMAL HIGH (ref 10.4–31.8)
TIBC: 267 ug/dL (ref 250–450)
UIBC: 175 ug/dL

## 2023-11-19 LAB — FERRITIN: Ferritin: 1043 ng/mL — ABNORMAL HIGH (ref 11–307)

## 2023-11-19 LAB — COMPREHENSIVE METABOLIC PANEL WITH GFR
ALT: 25 U/L (ref 0–44)
AST: 22 U/L (ref 15–41)
Albumin: 3 g/dL — ABNORMAL LOW (ref 3.5–5.0)
Alkaline Phosphatase: 61 U/L (ref 38–126)
Anion gap: 20 — ABNORMAL HIGH (ref 5–15)
BUN: 83 mg/dL — ABNORMAL HIGH (ref 6–20)
CO2: 14 mmol/L — ABNORMAL LOW (ref 22–32)
Calcium: 9.2 mg/dL (ref 8.9–10.3)
Chloride: 104 mmol/L (ref 98–111)
Creatinine, Ser: 5.72 mg/dL — ABNORMAL HIGH (ref 0.44–1.00)
GFR, Estimated: 8 mL/min — ABNORMAL LOW (ref >60)
Glucose, Bld: 232 mg/dL — ABNORMAL HIGH (ref 70–99)
Potassium: 3.9 mmol/L (ref 3.5–5.1)
Sodium: 138 mmol/L (ref 135–145)
Total Bilirubin: 1.1 mg/dL (ref 0.0–1.2)
Total Protein: 6.7 g/dL (ref 6.5–8.1)

## 2023-11-19 LAB — MAGNESIUM: Magnesium: 2.2 mg/dL (ref 1.7–2.4)

## 2023-11-19 LAB — LEGIONELLA PNEUMOPHILA SEROGP 1 UR AG: L. pneumophila Serogp 1 Ur Ag: NEGATIVE

## 2023-11-19 LAB — GLUCOSE, CAPILLARY
Glucose-Capillary: 204 mg/dL — ABNORMAL HIGH (ref 70–99)
Glucose-Capillary: 214 mg/dL — ABNORMAL HIGH (ref 70–99)
Glucose-Capillary: 228 mg/dL — ABNORMAL HIGH (ref 70–99)
Glucose-Capillary: 235 mg/dL — ABNORMAL HIGH (ref 70–99)
Glucose-Capillary: 257 mg/dL — ABNORMAL HIGH (ref 70–99)
Glucose-Capillary: 278 mg/dL — ABNORMAL HIGH (ref 70–99)
Glucose-Capillary: 90 mg/dL (ref 70–99)

## 2023-11-19 LAB — PHOSPHORUS: Phosphorus: 5.4 mg/dL — ABNORMAL HIGH (ref 2.5–4.6)

## 2023-11-19 LAB — HEPARIN LEVEL (UNFRACTIONATED): Heparin Unfractionated: 0.33 [IU]/mL (ref 0.30–0.70)

## 2023-11-19 MED ORDER — ACETAMINOPHEN 325 MG PO TABS
650.0000 mg | ORAL_TABLET | Freq: Four times a day (QID) | ORAL | Status: DC | PRN
  Administered 2023-11-19: 650 mg via ORAL
  Filled 2023-11-19: qty 2

## 2023-11-19 MED ORDER — INSULIN ASPART 100 UNIT/ML IJ SOLN
0.0000 [IU] | INTRAMUSCULAR | Status: DC
  Administered 2023-11-19 (×2): 11 [IU] via SUBCUTANEOUS
  Administered 2023-11-19: 7 [IU] via SUBCUTANEOUS
  Administered 2023-11-20: 3 [IU] via SUBCUTANEOUS

## 2023-11-19 MED ORDER — PROCHLORPERAZINE MALEATE 10 MG PO TABS: 10.0000 mg | ORAL_TABLET | Freq: Once | ORAL | Status: DC | PRN

## 2023-11-19 MED ORDER — INSULIN GLARGINE 100 UNIT/ML ~~LOC~~ SOLN
10.0000 [IU] | Freq: Every day | SUBCUTANEOUS | Status: DC
  Administered 2023-11-19: 10 [IU] via SUBCUTANEOUS
  Filled 2023-11-19 (×2): qty 0.1

## 2023-11-19 MED ORDER — INSULIN ASPART 100 UNIT/ML IJ SOLN
2.0000 [IU] | Freq: Once | INTRAMUSCULAR | Status: AC
  Administered 2023-11-19: 2 [IU] via SUBCUTANEOUS

## 2023-11-19 NOTE — Progress Notes (Signed)
 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 pneumonia  Subjective:  Seen at bedside with family present She is very fearful about dialysis initiation Creatinine further worsened to 5.7, K3.9, bicarbonate 14 with anion gap of 20 0.6 L urine output yesterday On hydrocortisone  and continues on home dosing of tacrolimus , MMF held Has right upper arm AVG which is patent On room air, blood pressures are stable  09/22 0701 - 09/23 0700 In: 548.2 [I.V.:388.3; IV Piggyback:159.9] Out: 575 [Urine:575]  Filed Weights   11/17/23 0500 11/18/23 0440 11/19/23 0500  Weight: 89.9 kg 87 kg 89.5 kg    Scheduled Meds:  aspirin  EC  81 mg Oral Daily   Chlorhexidine  Gluconate Cloth  6 each Topical Daily   clopidogrel   75 mg Oral Daily   hydrocortisone  sod succinate (SOLU-CORTEF ) inj  100 mg Intravenous Q12H   insulin  aspart  0-20 Units Subcutaneous Q4H   insulin  glargine  10 Units Subcutaneous QHS   midodrine   5 mg Oral TID WC   pantoprazole   40 mg Oral Daily   rosuvastatin   10 mg Oral Daily   sodium bicarbonate   1,300 mg Oral TID   tacrolimus   3 mg Oral Daily   And   tacrolimus   4 mg Oral QHS   Continuous Infusions:  cefTRIAXone  (ROCEPHIN )  IV 2 g (11/19/23 0925)   heparin  900 Units/hr (11/19/23 0600)   norepinephrine  (LEVOPHED ) Adult infusion 4 mcg/min (11/19/23 0600)   PRN Meds:.acetaminophen , docusate sodium , ipratropium-albuterol , polyethylene glycol, prochlorperazine   Current Labs: reviewed   Physical Exam:  Blood pressure (!) 101/56, pulse (!) 102, temperature 98.6 F (37 C), temperature source Oral, resp. rate (!) 27, height 5' 2 (1.575 m), weight 89.5 kg, SpO2 94%. 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 11/19/2023 transplant ultrasound with no hydronephrosis, perinephric fluid collection, Remains on tacrolimus  with drug level pending, MMF held, on stress dose steroids with baseline prednisone  5 mg daily as an outpatient No strong indication for dialysis today nor does she want to receive if possible.  Will continue supportive care and follow closely Septic shock from possible pulmonary cause remaining on low-dose pressors and on room air, on midodrine  Chronic immunosuppression as  Increased anion gap Mehta block acidosis on sodium bicarbonate  therapy with bicarbonate of 14 and anion gap of 20 this morning, continue to monitor  P As above, no HD at this time, continue close observation and supportive care Medication Issues; Preferred narcotic agents for pain control are hydromorphone , fentanyl , and methadone. Morphine  should not be used.  Baclofen should be avoided Avoid oral sodium phosphate and magnesium citrate based laxatives / bowel preps    Bernardino Gasman MD 11/19/2023, 1:04 PM  Recent Labs  Lab 11/17/23 0213 11/18/23 1039 11/19/23 0427  NA 140 139 138  K 3.6 4.3 3.9  CL 109 107 104  CO2 15* 15* 14*  GLUCOSE 160* 185* 232*  BUN 55* 72* 83*  CREATININE 3.97* 5.24* 5.72*  CALCIUM  9.2 9.2 9.2  PHOS 3.1  --  5.4*   Recent Labs  Lab 11/17/23 0213 11/18/23 1039 11/19/23 0427  WBC 11.9* 10.9* 16.2*  HGB 8.7* 8.2* 8.8*  HCT 28.4* 26.1* 27.2*  MCV 87.7 85.6 84.0  PLT 366 333 464*

## 2023-11-19 NOTE — Plan of Care (Signed)
  Problem: Education: Goal: Knowledge of General Education information will improve Description: Including pain rating scale, medication(s)/side effects and non-pharmacologic comfort measures Outcome: Progressing   Problem: Health Behavior/Discharge Planning: Goal: Ability to manage health-related needs will improve Outcome: Progressing   Problem: Clinical Measurements: Goal: Respiratory complications will improve Outcome: Progressing   Problem: Activity: Goal: Risk for activity intolerance will decrease Outcome: Progressing   Problem: Elimination: Goal: Will not experience complications related to urinary retention Outcome: Progressing   Problem: Pain Managment: Goal: General experience of comfort will improve and/or be controlled Outcome: Progressing   Problem: Skin Integrity: Goal: Risk for impaired skin integrity will decrease Outcome: Progressing   Problem: Health Behavior/Discharge Planning: Goal: Ability to identify and utilize available resources and services will improve Outcome: Progressing Goal: Ability to manage health-related needs will improve Outcome: Progressing   Problem: Clinical Measurements: Goal: Ability to maintain clinical measurements within normal limits will improve Outcome: Not Progressing Goal: Diagnostic test results will improve Outcome: Not Progressing   Problem: Nutrition: Goal: Adequate nutrition will be maintained Outcome: Not Progressing   Problem: Coping: Goal: Level of anxiety will decrease Outcome: Not Progressing   Problem: Elimination: Goal: Will not experience complications related to bowel motility Outcome: Not Progressing   Problem: Fluid Volume: Goal: Ability to maintain a balanced intake and output will improve Outcome: Not Progressing

## 2023-11-19 NOTE — Progress Notes (Signed)
 eLink Physician-Brief Progress Note Patient Name: Morgan [name] DOB: [birth year] MRN: [id number]   Date of Service  11/19/2023  HPI/Events of Note  RN reports patient complaining of nausea. QTc 519  eICU Interventions  Compazine  10 mg prn ordered     Intervention Category Minor Interventions: Routine modifications to care plan (e.g. PRN medications for pain, fever)  Damien DASEN Therma Lasure 11/19/2023, 5:16 AM

## 2023-11-19 NOTE — Progress Notes (Signed)
 PCCM interval progress note:  I spoke with transplant nephrologist Dr. Freddie at [facility] who felt that they would not manage anything differently there at this time and is fine with continuing care at [facility].  She is also in agreement with dialysis if necessary, continuing stress dose steroids until off pressors then tapering, and holding Cellcept  until antibiotics are complete.  Pt strongly requested her foley be removed, she has agreed to collect urine and to bladder scans as needed.   Morgan SAUNDERS Ardenia Stiner, PA-C

## 2023-11-19 NOTE — Progress Notes (Signed)
 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   Latex Itching   Nsaids Other (See Comments)    Hx kidney transplant   Tylenol  [Acetaminophen ] Other (See Comments)    Patient states she can not take due to kidney   Shellfish Allergy Other (See Comments)    Hx kidney transplant - told not to consume   Lactose Nausea Only   Misc. Sulfonamide Containing Compounds Itching and Other (See Comments)    Hx kidney transplant    Patient Measurements: Height: 5' 2 (157.5 cm) Weight: 89.5 kg (197 lb 5 oz) IBW/kg (Calculated) : 50.1 HEPARIN  DW (KG): 70.6  Vital Signs: Temp: 97.8 F (36.6 C) (09/23 0809) Temp Source: Oral (09/23 0809) BP: 101/56 (09/23 0630) Pulse Rate: 102 (09/23 0630)  Labs: Recent Labs    11/16/23 2050 11/16/23 2120 11/16/23 2252 11/17/23 0213 11/17/23 2034 11/18/23 1039 11/19/23 0427  HGB 9.2*  --   --  8.7*  --  8.2* 8.8*  HCT 29.6*  --   --  28.4*  --  26.1* 27.2*  PLT 408*  --   --  366  --  333 464*  LABPROT  --  14.2  --   --   --   --   --   INR  --  1.0  --   --   --   --   --   HEPARINUNFRC  --   --   --   --  0.97* 0.34 0.33  CREATININE 3.99*  --   --  3.97*  --  5.24* 5.72*  TROPONINIHS 88*  --  109*  --   --   --   --     Estimated Creatinine Clearance: 11.7 mL/min (A) (by C-G formula based on SCr of 5.72 mg/dL (H)).   Assessment: 52 yo with suspected PE. Pharmacy consulted for heparin  gtt while workup is ongoing for thromboembolic event.  Heparin  level continues to be  therapeutic. Hgb 8s, plt wnl  Goal of Therapy:  Heparin  level 0.3-0.7 units/ml Monitor platelets by anticoagulation protocol: Yes   Plan:  Continue heparin  drip at 900 units/hr Daily HL, CBC  F/u s/sx bleeding, Ddimer  Sergio Batch, PharmD, BCIDP, AAHIVP, CPP Infectious Disease Pharmacist 11/19/2023 9:13 AM

## 2023-11-19 NOTE — Progress Notes (Signed)
 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 Came to ED with DOE, SOB, coughing white-clear, and chills for one day. She vomited once before coming to ICU. In ED, her Temp 101.2 F. She was hypotensive and she was given NS 0.9% 250 cc bolus and started on Levophed , currently on 4 mcg/min. She make urine and denied dysuria, back pain, and hematuria. Denied URTI symptoms, wheezing, CP, rash, abd pain, or diarrhea. Denied smoking, illicit drug use, and alcohol drinking. She is compliant with meds.     Pertinent  Medical History  Additional hx: 10/08/2023: admitted due to acute hypoxic respiratory failure.  She left AMA on 10/10/2023. 10/19/2023, admitted due to hypertensive emergency and pulm edema. Significant Hospital Events: Including procedures, antibiotic start and stop dates in addition to other pertinent events   11/16/2023: ED eval, given Vanco, Cefepime , and Flagyl  11/16/2023: PCCM consulted for ICU admission 9/21 still on pressors.  Diagnostic data in regards to culture data all negative.  Worsening shortness of breath seemingly out of proportion to chest x-ray placed on heated high flow getting echocardiogram 9/22 respiratory status improved, HFNC down from 40L to 20L, still on levo 1mcg 9/23 now on RA, respiratory status improved, though creatinine rising and UOP 0.3cc/kg/hr   Interim History / Subjective:  Didn't sleep well overnight  Feels that her breathing has improved   Objective    Blood pressure (!)  101/56, pulse (!) 102, temperature 98.4 F (36.9 C), temperature source Oral, resp. rate (!) 27, height 5' 2 (1.575 m), weight 89.5 kg, SpO2 94%.    FiO2 (%):  [40 %] 40 %   Intake/Output Summary (Last 24 hours) at 11/19/2023 0748 Last data filed at 11/19/2023 9366 Gross per 24 hour  Intake 548.21 ml  Output 575 ml  Net -26.79 ml   Filed Weights   11/17/23 0500 11/18/23 0440 11/19/23 0500  Weight: 89.9 kg 87 kg 89.5 kg     General:  well nourished, non-toxic appearing F sitting up in bed in NAD  HEENT: MM pink/moist Neuro: alert and oriented, moving all extremities  CV: s1s2 rrr, no m/r/g PULM: clear bilaterally on RA without tachypnea or accessory muscle use  GI: soft, non-tender to palpation Extremities: warm/dry, no edema,  bilateral UE grafts     Resolved problem list   Assessment and Plan    Septic shock, possible pneumonia  Acute Hypoxic Respiratory Failure  Cultures to date has been negative MRSA screen negative Respiratory status improved  -continue Day #4 azithromycin  and ceftriaxone  for one week total  -follow-up urine Legionella antigen -BC NGTD and viral panel negative  -on levo 4mcg. Maintain MAP >65 -Echo with preserved EF  -continue stress dose steroids -D-dimer equivocal, PE on differential, but lower suspicion given infectious symptoms   CKD-5, s/p DDKT renal transplant 2018, borderline T-cell mediated allograft rejection (July 2025 renal bx) Chronic metabolic acidosis due to CKD-5 -appreciate nephrology consult, continue stress dose steroids -consult to transplant team pending -Renal dose medications -Continue home bicarbonate -continue solucortef 100mg  bid, hold cell-cept while on  abx for acute infection -continue tacrolimus , check tac level -additional lasix  trial per nephro, 100mg  IV bid  HTN, dyslipidemia, CAD, D-CHF (10/10/2023, EF 50-55%, G2DD) Plan -Hold BP meds -Continue ASA, Plavix  and statin    Anemia of chronic  illness -Continue to monitor  DM-2 (HbA1c 8.6%) Gluc >200 this am likely secondary to steroids  -Sliding scale insulin , change to resistant scale and add long acting 10 units qhs -Goal 140 to 180  -carb modified diet  Critical care time:  35 min     CRITICAL CARE Performed by: Leita SAUNDERS Hilda Rynders   Total critical care time: 35 minutes  Critical care time was exclusive of separately billable procedures and treating other patients.  Critical care was necessary to treat or prevent imminent or life-threatening deterioration.  Critical care was time spent personally by me on the following activities: development of treatment plan with patient and/or surrogate as well as nursing, discussions with consultants, evaluation of patient's response to treatment, examination of patient, obtaining history from patient or surrogate, ordering and performing treatments and interventions, ordering and review of laboratory studies, ordering and review of radiographic studies, pulse oximetry and re-evaluation of patient's condition.     Leita SAUNDERS Caryssa Elzey, PA-C [facility] See Amion for pager If no response to pager , please call [phone] until 7pm After 7:00 pm call Elink  [phone]

## 2023-11-19 NOTE — Progress Notes (Signed)
 eLink Physician-Brief Progress Note Patient Name: Morgan [name] DOB: 05-07-1970 MRN: [id number]   Date of Service  11/19/2023  HPI/Events of Note  Notified of headache  eICU Interventions  Tylenol  prn ordered. It has an allergy alert but patient states she has taken Tylenol  previously without problems.     Intervention Category Intermediate Interventions: Pain - evaluation and management Minor Interventions: Communication with other healthcare providers and/or family  Damien ONEIDA Grout 11/19/2023, 1:51 AM

## 2023-11-20 LAB — CBC
HCT: 23.9 % — ABNORMAL LOW (ref 36.0–46.0)
Hemoglobin: 7.8 g/dL — ABNORMAL LOW (ref 12.0–15.0)
MCH: 27.1 pg (ref 26.0–34.0)
MCHC: 32.6 g/dL (ref 30.0–36.0)
MCV: 83 fL (ref 80.0–100.0)
Platelets: 368 K/uL (ref 150–400)
RBC: 2.88 MIL/uL — ABNORMAL LOW (ref 3.87–5.11)
RDW: 14.2 % (ref 11.5–15.5)
WBC: 12.1 K/uL — ABNORMAL HIGH (ref 4.0–10.5)
nRBC: 0.4 % — ABNORMAL HIGH (ref 0.0–0.2)

## 2023-11-20 LAB — RENAL FUNCTION PANEL
Albumin: 2.9 g/dL — ABNORMAL LOW (ref 3.5–5.0)
Anion gap: 18 — ABNORMAL HIGH (ref 5–15)
BUN: 91 mg/dL — ABNORMAL HIGH (ref 6–20)
CO2: 18 mmol/L — ABNORMAL LOW (ref 22–32)
Calcium: 9.3 mg/dL (ref 8.9–10.3)
Chloride: 105 mmol/L (ref 98–111)
Creatinine, Ser: 5.93 mg/dL — ABNORMAL HIGH (ref 0.44–1.00)
GFR, Estimated: 8 mL/min — ABNORMAL LOW (ref >60)
Glucose, Bld: 109 mg/dL — ABNORMAL HIGH (ref 70–99)
Phosphorus: 5.4 mg/dL — ABNORMAL HIGH (ref 2.5–4.6)
Potassium: 3.4 mmol/L — ABNORMAL LOW (ref 3.5–5.1)
Sodium: 141 mmol/L (ref 135–145)

## 2023-11-20 LAB — GLUCOSE, CAPILLARY
Glucose-Capillary: 122 mg/dL — ABNORMAL HIGH (ref 70–99)
Glucose-Capillary: 174 mg/dL — ABNORMAL HIGH (ref 70–99)
Glucose-Capillary: 259 mg/dL — ABNORMAL HIGH (ref 70–99)
Glucose-Capillary: 378 mg/dL — ABNORMAL HIGH (ref 70–99)
Glucose-Capillary: 97 mg/dL (ref 70–99)

## 2023-11-20 LAB — MAGNESIUM: Magnesium: 2.2 mg/dL (ref 1.7–2.4)

## 2023-11-20 LAB — HEPARIN LEVEL (UNFRACTIONATED): Heparin Unfractionated: 0.36 [IU]/mL (ref 0.30–0.70)

## 2023-11-20 LAB — TACROLIMUS LEVEL: Tacrolimus (FK506) - LabCorp: 9.5 ng/mL (ref 5.0–20.0)

## 2023-11-20 MED ORDER — INSULIN ASPART 100 UNIT/ML IJ SOLN
0.0000 [IU] | Freq: Three times a day (TID) | INTRAMUSCULAR | Status: DC
  Administered 2023-11-20: 3 [IU] via SUBCUTANEOUS
  Administered 2023-11-20: 8 [IU] via SUBCUTANEOUS

## 2023-11-20 MED ORDER — HYDROCORTISONE SOD SUC (PF) 100 MG IJ SOLR
50.0000 mg | Freq: Two times a day (BID) | INTRAMUSCULAR | Status: DC
Start: 2023-11-20 — End: 2023-11-22
  Administered 2023-11-20 – 2023-11-21 (×3): 50 mg via INTRAVENOUS
  Filled 2023-11-20: qty 2
  Filled 2023-11-20: qty 1
  Filled 2023-11-20: qty 2
  Filled 2023-11-20: qty 1

## 2023-11-20 MED ORDER — INSULIN GLARGINE 100 UNIT/ML ~~LOC~~ SOLN
8.0000 [IU] | Freq: Every day | SUBCUTANEOUS | Status: DC
  Administered 2023-11-20: 8 [IU] via SUBCUTANEOUS
  Filled 2023-11-20 (×2): qty 0.08

## 2023-11-20 MED ORDER — INSULIN ASPART 100 UNIT/ML IJ SOLN
0.0000 [IU] | Freq: Three times a day (TID) | INTRAMUSCULAR | Status: DC
  Administered 2023-11-21 – 2023-11-22 (×4): 8 [IU] via SUBCUTANEOUS
  Administered 2023-11-22 (×2): 5 [IU] via SUBCUTANEOUS

## 2023-11-20 MED ORDER — HEPARIN SODIUM (PORCINE) 5000 UNIT/ML IJ SOLN
5000.0000 [IU] | Freq: Three times a day (TID) | INTRAMUSCULAR | Status: DC
  Administered 2023-11-20 – 2023-11-23 (×9): 5000 [IU] via SUBCUTANEOUS
  Filled 2023-11-20 (×9): qty 1

## 2023-11-20 MED ORDER — INSULIN ASPART 100 UNIT/ML IJ SOLN
0.0000 [IU] | Freq: Every day | INTRAMUSCULAR | Status: DC
  Administered 2023-11-21: 5 [IU] via SUBCUTANEOUS
  Administered 2023-11-21: 3 [IU] via SUBCUTANEOUS
  Administered 2023-11-22: 2 [IU] via SUBCUTANEOUS

## 2023-11-20 MED ORDER — ORAL CARE MOUTH RINSE: 15.0000 mL | OROMUCOSAL | Status: DC | PRN

## 2023-11-20 NOTE — Plan of Care (Signed)
  Problem: Education: Goal: Knowledge of General Education information will improve Description: Including pain rating scale, medication(s)/side effects and non-pharmacologic comfort measures Outcome: Progressing   Problem: Health Behavior/Discharge Planning: Goal: Ability to manage health-related needs will improve Outcome: Progressing   Problem: Clinical Measurements: Goal: Diagnostic test results will improve Outcome: Progressing Goal: Respiratory complications will improve Outcome: Progressing   Problem: Activity: Goal: Risk for activity intolerance will decrease Outcome: Progressing   Problem: Nutrition: Goal: Adequate nutrition will be maintained Outcome: Progressing   Problem: Coping: Goal: Level of anxiety will decrease Outcome: Progressing   Problem: Elimination: Goal: Will not experience complications related to bowel motility Outcome: Progressing   Problem: Pain Managment: Goal: General experience of comfort will improve and/or be controlled Outcome: Progressing   Problem: Safety: Goal: Ability to remain free from injury will improve Outcome: Progressing   Problem: Skin Integrity: Goal: Risk for impaired skin integrity will decrease Outcome: Progressing   Problem: Coping: Goal: Ability to adjust to condition or change in health will improve Outcome: Progressing   Problem: Fluid Volume: Goal: Ability to maintain a balanced intake and output will improve Outcome: Progressing   Problem: Metabolic: Goal: Ability to maintain appropriate glucose levels will improve Outcome: Progressing   Problem: Nutritional: Goal: Maintenance of adequate nutrition will improve Outcome: Progressing   Problem: Skin Integrity: Goal: Risk for impaired skin integrity will decrease Outcome: Progressing

## 2023-11-20 NOTE — Progress Notes (Signed)
 Admit: 11/16/2023 LOS: 4  2F status post DDKT with AKI on CKD 5 with septic shock from ? pneumonia  Subjective:  Seen at bedside, feeling better Creatinine slightly further worsened to 5.9, K3.4, bicarbonate 18, BUN 91 At least 0.6 L urine output yesterday On hydrocortisone  and continues on home dosing of tacrolimus , MMF held during infection Has right upper arm AVG which is patent On room air, blood pressures are stable  09/23 0701 - 09/24 0700 In: 446.2 [I.V.:346.2; IV Piggyback:100] Out: 650 [Urine:650]  Filed Weights   11/18/23 0440 11/19/23 0500 11/20/23 0500  Weight: 87 kg 89.5 kg 86.3 kg    Scheduled Meds:  aspirin  EC  81 mg Oral Daily   Chlorhexidine  Gluconate Cloth  6 each Topical Daily   clopidogrel   75 mg Oral Daily   heparin   5,000 Units Subcutaneous Q8H   hydrocortisone  sod succinate (SOLU-CORTEF ) inj  50 mg Intravenous Q12H   insulin  aspart  0-15 Units Subcutaneous TID WC   insulin  glargine  8 Units Subcutaneous QHS   midodrine   5 mg Oral TID WC   pantoprazole   40 mg Oral Daily   rosuvastatin   10 mg Oral Daily   sodium bicarbonate   1,300 mg Oral TID   tacrolimus   3 mg Oral Daily   And   tacrolimus   4 mg Oral QHS   Continuous Infusions:  cefTRIAXone  (ROCEPHIN )  IV Stopped (11/20/23 1027)   PRN Meds:.acetaminophen , docusate sodium , ipratropium-albuterol , mouth rinse, polyethylene glycol, prochlorperazine   Current Labs: reviewed   Physical Exam:  Blood pressure 115/63, pulse 97, temperature 98.6 F (37 C), temperature source Oral, resp. rate 13, height 5' 2 (1.575 m), weight 86.3 kg, SpO2 99%. Awake, alert, NAD, in bed Regular, normal S1 and S2 Right upper arm AVG with bruit and thrill Normal work of breathing, clear throughout No significant peripheral edema  A AKI on CKD 5 with progressive GFR decline as an outpatient and transplant biopsy July 2025 showing severe IVF/TA, chronic AMR, diabetic nephropathy, FSGS, borderline T-cell mediated  rejection. 11/19/2023 transplant ultrasound with no hydronephrosis, perinephric fluid collection, Remains on tacrolimus  with drug level pending, MMF held (home dose 1000 qAM, 750 qPM), on stress dose steroids with baseline prednisone  5 mg daily as an outpatient No strong indication for dialysis today nor does she want to receive if possible.  Will continue supportive care and follow closely Anticipate restarting mycophenolate  at lower dose in the next 24 to 48 hours Septic shock from possible pulmonary cause remaining on low-dose pressors and on room air, on midodrine , improving Chronic immunosuppression Increased anion gap metabolic acidosis on sodium bicarbonate  therapy, CTM  P As above, no HD at this time, continue close observation and supportive care Tentative to resume MMF in the next 1 to 2 days Medication Issues; Preferred narcotic agents for pain control are hydromorphone , fentanyl , and methadone. Morphine  should not be used.  Baclofen should be avoided Avoid oral sodium phosphate and magnesium citrate based laxatives / bowel preps    Bernardino Gasman MD 11/20/2023, 12:16 PM  Recent Labs  Lab 11/17/23 0213 11/18/23 1039 11/19/23 0427 11/20/23 0440  NA 140 139 138 141  K 3.6 4.3 3.9 3.4*  CL 109 107 104 105  CO2 15* 15* 14* 18*  GLUCOSE 160* 185* 232* 109*  BUN 55* 72* 83* 91*  CREATININE 3.97* 5.24* 5.72* 5.93*  CALCIUM  9.2 9.2 9.2 9.3  PHOS 3.1  --  5.4* 5.4*   Recent Labs  Lab 11/18/23 1039 11/19/23 0427 11/20/23 0440  WBC 10.9* 16.2* 12.1*  HGB 8.2* 8.8* 7.8*  HCT 26.1* 27.2* 23.9*  MCV 85.6 84.0 83.0  PLT 333 464* 368

## 2023-11-20 NOTE — Progress Notes (Signed)
 Initial Nutrition Assessment  DOCUMENTATION CODES:  Obesity unspecified  INTERVENTION:  Continue carb modified diet as ordered to aid in blood sugar control Consider liberalization if oral intake is poor Liberalize fluid restriction per discussion with Nephrology Continue Glucerna Smart Carb TID from home (180 kcal and 15g protein per serving) Snacks TID between meals as tolerated MVI with minerals daily Recommend checking vitamin/mineral labs to assess for any micronutrient deficiencies: Zinc, copper, Vitamin B6, Vitamin B12, Thiamine, Vitamin D , Vitamin C, folate, CRP  NUTRITION DIAGNOSIS:  Inadequate oral intake related to acute illness as evidenced by per patient/family report.  GOAL:  Patient will meet greater than or equal to 90% of their needs  MONITOR:  PO intake, Supplement acceptance, Labs, Weight trends  REASON FOR ASSESSMENT:  Consult Assessment of nutrition requirement/status, Poor PO (Reports very poor nutrition over last month- due to nausea but trying to lose wt, ?20lbs last month.  On mounjaro)  ASSESSMENT:   Pt admitted with shortness of breath, found to have septic shock questionably d/t PNA. 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   Nephrology following. No strong indication for dialysis today nor does she want to receive if possible. Continue with supportive care and following closely. Spoke with Nephrology as pt was ordered a fluid restriction over night. Per Nephrology, no fluid restriction indicated at this time.   Attempted to speak with pt at bedside and obtain detailed nutrition related history. 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 She states that she prefers to cook at home.   Pt is a Pharmacist, hospital. She states that she keeps a busy schedule. She usually drinks 2-3 Glucerna daily. She always tries to eat a home cooked meal for dinner though unable to elicit details on daytime meals. She has been on mounjaro and reports liking it initially though has been experiencing nausea with some foods at higher dose. She also endorses significant diarrhea with increase in cellcept  within the last month.   She reports consuming egg white omelettes for breakfast at times with spinach and feta cheese. Pt reports that she is lactose intolerant so she consumes goats milk occasionally.   Pt mentions that her energy has been poor lately and she gets winded easily. She has had to use her cane more often for mobility support and feels more weak.   Pt is discouraged with weight gain secondary to steroid use and feels that insulin  has been causing weight gain.   Unable to further educate patient on nutrition recommendations.   Per review of weight history, it appears that since 08/24 she has experienced a weight loss of 4.6% which is borderline significant for time frame and concerning given acute on chronic illness.   Medications: solu-cortef , SSI 0-15 units TID, lantus  8 units daily, sodium bicarbonate  1300mg  TID, IV abx  Labs:  Potassium 3.4 NIM 91 CR 5.93 Anion gap 18 GFR 8 CBG's 90-278 x24 hours  NUTRITION - FOCUSED PHYSICAL EXAM:  Flowsheet Row Most Recent Value  Orbital Region No depletion  Upper Arm Region No depletion  Thoracic and Lumbar Region No depletion  Buccal Region No depletion  Temple Region Mild depletion  Clavicle Bone Region No depletion  Clavicle and Acromion Bone Region No depletion  Scapular Bone Region No depletion  Dorsal Hand No depletion  Patellar Region No depletion  Anterior Thigh Region No depletion  Posterior Calf Region No depletion  Edema (RD Assessment) Mild  [non pitting BLE]  Hair Other (Comment)  [thinning]  Eyes Reviewed  Mouth Reviewed  Skin  Reviewed  Nails Other (Comment)  [acrylic nails/painted]    Diet Order:   Diet Order             Diet Carb Modified Fluid consistency: Thin; Room service appropriate? Yes with Assist  Diet effective now                   EDUCATION NEEDS:   Not appropriate for education at this time  Skin:  Skin Assessment: Reviewed RN Assessment  Last BM:  9/24 type 6 small x2  Height:   Ht Readings from Last 1 Encounters:  11/16/23 5' 2 (1.575 m)    Weight:   Wt Readings from Last 1 Encounters:  11/20/23 86.3 kg    Ideal Body Weight:  50 kg  BMI:  Body mass index is 34.8 kg/m.  Estimated Nutritional Needs:   Kcal:  1500-1700  Protein:  75-90g  Fluid:  1.5-1.7L  Royce Maris, RDN, LDN Clinical Nutrition See AMiON for contact information.

## 2023-11-20 NOTE — Plan of Care (Signed)
  Problem: Education: Goal: Knowledge of General Education information will improve Description: Including pain rating scale, medication(s)/side effects and non-pharmacologic comfort measures Outcome: Progressing   Problem: Health Behavior/Discharge Planning: Goal: Ability to manage health-related needs will improve Outcome: Progressing   Problem: Clinical Measurements: Goal: Ability to maintain clinical measurements within normal limits will improve Outcome: Progressing Goal: Will remain free from infection Outcome: Progressing Goal: Diagnostic test results will improve Outcome: Progressing Goal: Respiratory complications will improve Outcome: Progressing Goal: Cardiovascular complication will be avoided Outcome: Progressing   Problem: Activity: Goal: Risk for activity intolerance will decrease Outcome: Progressing   Problem: Nutrition: Goal: Adequate nutrition will be maintained Outcome: Progressing   Problem: Coping: Goal: Level of anxiety will decrease Outcome: Progressing   Problem: Elimination: Goal: Will not experience complications related to bowel motility Outcome: Progressing Goal: Will not experience complications related to urinary retention Outcome: Progressing   Problem: Pain Managment: Goal: General experience of comfort will improve and/or be controlled Outcome: Progressing   Problem: Safety: Goal: Ability to remain free from injury will improve Outcome: Progressing   Problem: Skin Integrity: Goal: Risk for impaired skin integrity will decrease Outcome: Progressing   Problem: Education: Goal: Ability to describe self-care measures that may prevent or decrease complications (Diabetes Survival Skills Education) will improve Outcome: Progressing Goal: Individualized Educational Video(s) Outcome: Progressing   Problem: Coping: Goal: Ability to adjust to condition or change in health will improve Outcome: Progressing   Problem: Fluid  Volume: Goal: Ability to maintain a balanced intake and output will improve Outcome: Progressing   Problem: Health Behavior/Discharge Planning: Goal: Ability to identify and utilize available resources and services will improve Outcome: Progressing Goal: Ability to manage health-related needs will improve Outcome: Progressing   Problem: Metabolic: Goal: Ability to maintain appropriate glucose levels will improve Outcome: Progressing

## 2023-11-20 NOTE — Progress Notes (Signed)
 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 Came to ED with DOE, SOB, coughing white-clear, and chills for one day. She vomited once before coming to ICU. In ED, her Temp 101.2 F. She was hypotensive and she was given NS 0.9% 250 cc bolus and started on Levophed , currently on 4 mcg/min. She make urine and denied dysuria, back pain, and hematuria. Denied URTI symptoms, wheezing, CP, rash, abd pain, or diarrhea. Denied smoking, illicit drug use, and alcohol drinking. She is compliant with meds.   Pertinent  Medical History  Additional hx: 10/08/2023: admitted due to acute hypoxic respiratory failure.  She left AMA on 10/10/2023. 10/19/2023, admitted due to hypertensive emergency and pulm edema. Significant Hospital Events: Including procedures, antibiotic start and stop dates in addition to other pertinent events   11/16/2023: ED eval, given Vanco, Cefepime , and Flagyl  11/16/2023: PCCM consulted for ICU admission 9/21 still on pressors.  Diagnostic data in regards to culture data all negative.  Worsening shortness of breath seemingly out of proportion to chest x-ray placed on heated high flow getting echocardiogram 9/22 respiratory status improved, HFNC down from 40L to 20L, still on levo 1mcg 9/23 now on RA, respiratory status improved, though creatinine rising and UOP 0.3cc/kg/hr, off pressors  Interim History / Subjective:  No complaints.  Remains off pressors and on RA Reports has not been eating.  Nausea x 1 month as  mounjaro dose was increased but also trying to lose the 50lbs she gained since starting steroids.  Estimates 20lb weight loss over last month, primarily just drinking glucerna.  Also reports ongoing diarrhea since her cellcept  was increased recently.  Objective    Blood pressure 101/60, pulse 94, temperature 98.8 F (37.1 C), temperature source Oral, resp. rate 17, height 5' 2 (1.575 m), weight 86.3 kg, SpO2 98%.        Intake/Output Summary (Last 24 hours) at 11/20/2023 0833 Last data filed at 11/20/2023 0800 Gross per 24 hour  Intake 455.19 ml  Output 650 ml  Net -194.81 ml   Filed Weights   11/18/23 0440 11/19/23 0500 11/20/23 0500  Weight: 87 kg 89.5 kg 86.3 kg   General:  pleasant and conversant adult female sitting in bed in NAD HEENT: MM pink/moist Neuro: Aox4, MAE CV: rr, NSR, no murmur, BUE AVG +b/t PULM:  non labored, CTA GI: soft, bs+, mild tenderness in RUQ to palpation  Extremities: warm/dry, no tibial edema  Skin: no rashes   2 urinary occurrences overnight, yesterday during days Wts 89.5> 86.3 small BM reported   Resolved problem list   Assessment and Plan   Septic shock, resolved possible LLL pneumonia vs hypovolemia due to poor PO intake and diarrhea 2/2 cell-cept Acute Hypoxic Respiratory Failure, resolved  MRSA screen negative.  RVP neg. Urine strep and legionella neg.   Echo with preserved EF  - remains on RA, no respiratory complaints - cultures remains NGTD - remains afebrile, trend WBC/ fever curve  - cont 5 days of empiric azithromycin  and ceftriaxone  - remains normotensive off pressors, goal MAP >  65 - cont midodrine  5mg  TID - start to wean stress steroids 100mg  q12> 50mg  q12 back to baseline prednisone  5mg  daily - d/c heparin  gtt> no RV strain on TTE and neg LE dopplers,  doubtful for significant PE, not able to CT given CKD - overall remains hemodynamically and respiratory stable.  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  Continue current management - Per nephrology, appreciate input -appreciate nephrology consult, decrease stress dose steroids 50mg  q 12.  Wean back to baseline prednisone  5mg  daily - s/p lasix  9/24> x2 urinary occurrences overnight. Keep strict I/Os> defer further doses to Nephrology - cont Bicarb TID - stable renal indices, trend - no HD needs for now - hold cell-cept while on abx for acute infection> per Nephrology.  Having frequent diarrhea with recently increased dose - continue tacrolimus , tac level 9.5 on 9/22 - trend renal indices  - strict I/Os, daily wts - avoid nephrotoxins, renal dose meds, hemodynamic support as above - avoid nephrotoxic medications including NSAIDs and iodinated intravenous contrast   HTN, dyslipidemia, CAD, D-CHF (10/10/2023, EF 50-55%, G2DD) Plan - cont to hold pta norvasc   - cont ASA, Plavix  and statin   Anemia of chronic illness - stable, trend   DM-2 (HbA1c 8.6% in August) Gluc >200 this am likely secondary to steroids  - CBG liable, down into 90's overnight.  Admits to not eating much.  Pt reports 20lb wt loss over last month not able to eat other than glucerna.  wts look stable in chart review.  Will consult RD for assessment and nutritional guidance  - decrease lantus  to 8 and decrease prn SSI to moderate - RD consult given poor nutrition - would recommend holding off on restarting mounjaro till outpt f/u given reported significant nausea since dose adjustment and diarrhea with cell cept - carb/ renal modified diet   Poor mobility - pt requesting PT consult  Critical care time:  n/a       Lyle Pesa, NP [facility] 11/20/2023, 8:34 AM  See Amion for pager If no response to pager , please call [phone] until  7pm After 7:00 pm call Elink  [phone]

## 2023-11-21 LAB — GLUCOSE, CAPILLARY
Glucose-Capillary: 292 mg/dL — ABNORMAL HIGH (ref 70–99)
Glucose-Capillary: 266 mg/dL — ABNORMAL HIGH (ref 70–99)
Glucose-Capillary: 288 mg/dL — ABNORMAL HIGH (ref 70–99)
Glucose-Capillary: 274 mg/dL — ABNORMAL HIGH (ref 70–99)

## 2023-11-21 LAB — CULTURE, BLOOD (ROUTINE X 2)
Culture: NO GROWTH
Culture: NO GROWTH
Special Requests: ADEQUATE
Special Requests: ADEQUATE

## 2023-11-21 LAB — RENAL FUNCTION PANEL
Albumin: 2.8 g/dL — ABNORMAL LOW (ref 3.5–5.0)
Anion gap: 16 — ABNORMAL HIGH (ref 5–15)
BUN: 95 mg/dL — ABNORMAL HIGH (ref 6–20)
CO2: 18 mmol/L — ABNORMAL LOW (ref 22–32)
Calcium: 9.1 mg/dL (ref 8.9–10.3)
Chloride: 103 mmol/L (ref 98–111)
Creatinine, Ser: 5.84 mg/dL — ABNORMAL HIGH (ref 0.44–1.00)
GFR, Estimated: 8 mL/min — ABNORMAL LOW (ref >60)
Glucose, Bld: 360 mg/dL — ABNORMAL HIGH (ref 70–99)
Phosphorus: 5.9 mg/dL — ABNORMAL HIGH (ref 2.5–4.6)
Potassium: 3 mmol/L — ABNORMAL LOW (ref 3.5–5.1)
Sodium: 137 mmol/L (ref 135–145)

## 2023-11-21 LAB — CBC
HCT: 23.3 % — ABNORMAL LOW (ref 36.0–46.0)
Hemoglobin: 7.4 g/dL — ABNORMAL LOW (ref 12.0–15.0)
MCH: 27.3 pg (ref 26.0–34.0)
MCHC: 31.8 g/dL (ref 30.0–36.0)
MCV: 86 fL (ref 80.0–100.0)
Platelets: 346 K/uL (ref 150–400)
RBC: 2.71 MIL/uL — ABNORMAL LOW (ref 3.87–5.11)
RDW: 13.9 % (ref 11.5–15.5)
WBC: 11 K/uL — ABNORMAL HIGH (ref 4.0–10.5)
nRBC: 0.5 % — ABNORMAL HIGH (ref 0.0–0.2)

## 2023-11-21 MED ORDER — INSULIN ASPART 100 UNIT/ML IJ SOLN
4.0000 [IU] | Freq: Three times a day (TID) | INTRAMUSCULAR | Status: DC
  Administered 2023-11-21 – 2023-11-22 (×5): 4 [IU] via SUBCUTANEOUS

## 2023-11-21 MED ORDER — GLUCAGON HCL RDNA (DIAGNOSTIC) 1 MG IJ SOLR: 1.0000 mg | INTRAMUSCULAR | Status: DC | PRN

## 2023-11-21 MED ORDER — POTASSIUM CHLORIDE CRYS ER 20 MEQ PO TBCR
40.0000 meq | EXTENDED_RELEASE_TABLET | Freq: Once | ORAL | Status: AC
  Administered 2023-11-21: 40 meq via ORAL
  Filled 2023-11-21: qty 2

## 2023-11-21 MED ORDER — RENA-VITE PO TABS
1.0000 | ORAL_TABLET | Freq: Every day | ORAL | Status: DC
  Administered 2023-11-21 – 2023-11-22 (×2): 1 via ORAL
  Filled 2023-11-21 (×2): qty 1

## 2023-11-21 MED ORDER — ONDANSETRON HCL 4 MG/2ML IJ SOLN: 4.0000 mg | Freq: Four times a day (QID) | INTRAMUSCULAR | Status: DC | PRN

## 2023-11-21 MED ORDER — INSULIN GLARGINE 100 UNIT/ML ~~LOC~~ SOLN
8.0000 [IU] | Freq: Two times a day (BID) | SUBCUTANEOUS | Status: DC
  Administered 2023-11-21 – 2023-11-23 (×4): 8 [IU] via SUBCUTANEOUS
  Filled 2023-11-21 (×6): qty 0.08

## 2023-11-21 MED ORDER — MYCOPHENOLATE MOFETIL 250 MG PO CAPS
250.0000 mg | ORAL_CAPSULE | Freq: Two times a day (BID) | ORAL | Status: DC
  Administered 2023-11-21 – 2023-11-23 (×5): 250 mg via ORAL
  Filled 2023-11-21 (×6): qty 1

## 2023-11-21 MED ORDER — HYDRALAZINE HCL 20 MG/ML IJ SOLN
10.0000 mg | INTRAMUSCULAR | Status: DC | PRN
Start: 2023-11-21 — End: 2023-11-23

## 2023-11-21 MED ORDER — METOPROLOL TARTRATE 5 MG/5ML IV SOLN: 5.0000 mg | INTRAVENOUS | Status: DC | PRN

## 2023-11-21 NOTE — Hospital Course (Addendum)
 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  Initially noted to be in septic shock secondary to pneumonia requiring ICU admission now on broad-spectrum antibiotics and requiring pressors.  She was also placed on heated high flow.  9/26: Hemodynamically stable on room air, blood pressures stable on midodrine , switching Solu-Medrol  to p.o. prednisone , needed taper to baseline prednisone  use of 5 mg per nephrology. Can be discharged in 1 to 2 days if renal function remains stable.  9/27: Remained hemodynamically stable.  Stable renal function with creatinine at 5.13 today.  Nephrology cleared her for discharge with a close outpatient follow-up.  Patient was given a tapering course of prednisone , she will take 50 mg twice daily if for next 2 days followed by less 10 mg every 3 days until she completed the course and then she will resume her home dose of prednisone .  Patient will continue with tacrolimus  and CellCept .  Her home antihypertensives and diuretics were held, patient is being discharged on midodrine  5 mg 3 times daily to maintain her blood pressure.  Her primary care provider or nephrologist can make adjustment and restart antihypertensives as appropriate.  Patient will continue on current medications and need to have a close follow-up with her providers for further assistance.  Assessment & Plan:  Septic shock secondary to left lower lobe pneumonia Acute respiratory failure - Shock physiology has resolved.  Currently on Rocephin  and azithromycin , to complete total 5-day course.  Cultures remain negative to date, now weaned down to room air.  MRSA/RVP, urine strep and Legionella are negative.   Echocardiogram shows preserved EF.  Continue bronchodilators, I-S and flutter valve - Initiated on stress dose steroids with plan to slowly wean down to daily prednisone  5 mg - On midodrine  5 mg 3 times daily, will adjust as needed.   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  Required Lasix , seen by nephrology team here.  Continue bicarb, no need for dialysis.  Continue immunosuppressants -Has a patent right upper extremity AV graft.  Ultrasound renal transplant with Doppler overall unremarkable  Chronic congestive heart failure with preserved EF 55% Coronary artery disease Hyperlipidemia - Continue aspirin , statin, Plavix  - Norvasc  on hold  Essential hypertension - Norvasc  currently on hold.  IV as needed  Diabetes mellitus type 2, A1c 8.6 - Sliding scale and Accu-Cheks.  Adjust long-acting as necessary  Anemia of chronic disease - Continue to monitor hemoglobin closely

## 2023-11-21 NOTE — Progress Notes (Addendum)
 Pt. CBGs elevated - 401. Elink provider notified. HS SSI orders received. CBG repeated and SSI given.

## 2023-11-21 NOTE — Progress Notes (Signed)
 Admit: 11/16/2023 LOS: 5  57F status post DDKT with AKI on CKD 5 with septic shock from ? pneumonia  Subjective:  Seen at bedside, no interval events Creatinine appears to be at a plateau, 5.8, K3.0, bicarbonate 18 with anion gap of 16 Made 1 L of urine yesterday, improved Has right upper arm AVG which is patent On room air, blood pressures are stable  09/24 0701 - 09/25 0700 In: 842.2 [P.O.:720; I.V.:22.2; IV Piggyback:100.1] Out: 1000 [Urine:1000]  Filed Weights   11/18/23 0440 11/19/23 0500 11/20/23 0500  Weight: 87 kg 89.5 kg 86.3 kg    Scheduled Meds:  aspirin  EC  81 mg Oral Daily   Chlorhexidine  Gluconate Cloth  6 each Topical Daily   clopidogrel   75 mg Oral Daily   heparin   5,000 Units Subcutaneous Q8H   hydrocortisone  sod succinate (SOLU-CORTEF ) inj  50 mg Intravenous Q12H   insulin  aspart  0-15 Units Subcutaneous TID WC   insulin  aspart  0-5 Units Subcutaneous QHS   insulin  glargine  8 Units Subcutaneous BID   midodrine   5 mg Oral TID WC   pantoprazole   40 mg Oral Daily   rosuvastatin   10 mg Oral Daily   sodium bicarbonate   1,300 mg Oral TID   tacrolimus   3 mg Oral Daily   And   tacrolimus   4 mg Oral QHS   Continuous Infusions:  cefTRIAXone  (ROCEPHIN )  IV 2 g (11/21/23 1127)   PRN Meds:.acetaminophen , docusate sodium , glucagon  (human recombinant), hydrALAZINE , ipratropium-albuterol , metoprolol  tartrate, ondansetron  (ZOFRAN ) IV, mouth rinse, polyethylene glycol, prochlorperazine   Current Labs: reviewed   Physical Exam:  Blood pressure (!) 118/57, pulse 86, temperature 98.6 F (37 C), temperature source Oral, resp. rate 17, height 5' 2 (1.575 m), weight 86.3 kg, SpO2 97%. Awake, alert, NAD, in bed Regular, normal S1 and S2 Right upper arm AVG with bruit and thrill Normal work of breathing, clear throughout No significant peripheral edema  A AKI on CKD 5 with progressive GFR decline as an outpatient and transplant biopsy July 2025 showing severe IVF/TA,  chronic AMR, diabetic nephropathy, FSGS, borderline T-cell mediated rejection. 11/19/2023 transplant ultrasound with no hydronephrosis, perinephric fluid collection, Remains on tacrolimus  with drug level pending, MMF held (home dose 1000 qAM, 750 qPM) thus far, on stress dose steroids with baseline prednisone  5 mg daily as an outpatient GFR low but may be stabilizing, no dialysis today Septic shock from possible pulmonary cause remaining on low-dose pressors and on room air, on midodrine , improving Chronic immunosuppression Increased anion gap metabolic acidosis on sodium bicarbonate  therapy, CTM  P As above, no HD at this time, continue close observation and supportive care Resume MMF at reduced dose, 250 mg twice daily Medication Issues; Preferred narcotic agents for pain control are hydromorphone , fentanyl , and methadone. Morphine  should not be used.  Baclofen should be avoided Avoid oral sodium phosphate and magnesium citrate based laxatives / bowel preps    Bernardino Gasman MD 11/21/2023, 11:40 AM  Recent Labs  Lab 11/19/23 0427 11/20/23 0440 11/21/23 0444  NA 138 141 137  K 3.9 3.4* 3.0*  CL 104 105 103  CO2 14* 18* 18*  GLUCOSE 232* 109* 360*  BUN 83* 91* 95*  CREATININE 5.72* 5.93* 5.84*  CALCIUM  9.2 9.3 9.1  PHOS 5.4* 5.4* 5.9*   Recent Labs  Lab 11/19/23 0427 11/20/23 0440 11/21/23 0444  WBC 16.2* 12.1* 11.0*  HGB 8.8* 7.8* 7.4*  HCT 27.2* 23.9* 23.3*  MCV 84.0 83.0 86.0  PLT 464* 368 346

## 2023-11-21 NOTE — Progress Notes (Signed)
 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  Initially noted to be in septic shock secondary to pneumonia requiring ICU admission now on broad-spectrum antibiotics and requiring pressors.  She was also placed on heated high flow.   Assessment & Plan:  Septic shock secondary to left lower lobe pneumonia Acute respiratory failure - Shock physiology has resolved.  Currently on Rocephin  and azithromycin , to complete total 5-day course.  Cultures remain negative to date, now weaned down to room air.  MRSA/RVP, urine strep and Legionella are negative.  Echocardiogram shows preserved EF.  Continue bronchodilators, I-S and flutter valve - Initiated on stress dose steroids with plan to slowly wean down to daily prednisone  5 mg - On midodrine  5 mg 3 times daily, will adjust as needed.   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  Required Lasix , seen by nephrology team here.  Continue bicarb, no need for dialysis.  Continue immunosuppressants -Has a patent right upper extremity AV graft.  Ultrasound renal transplant with Doppler overall unremarkable  Chronic congestive heart failure with preserved EF 55% Coronary artery disease Hyperlipidemia - Continue aspirin , statin, Plavix  - Norvasc  on hold  Essential hypertension - Norvasc  currently on hold.  IV as needed  Diabetes mellitus type 2, A1c 8.6 - Sliding scale and Accu-Cheks.  Adjust long-acting as necessary  Anemia of  chronic disease - Continue to monitor hemoglobin closely  DVT prophylaxis: heparin  injection 5,000 Units Start: 11/20/23 1400 Place and maintain sequential compression device Start: 11/17/23 2028 SCDs Start: 11/16/23 2338      Code Status: Full Code Family Communication:   Status is: Inpatient Remains inpatient appropriate because: Continue hospital stay for at least next 24 hours   PT Follow up Recs:   Subjective: Seen at bedside, feels better.  Blood pressures improved.  Denies any shortness of breath at rest   Examination:  General exam: Appears calm and comfortable  Respiratory system: Normal bibasilar crackles Cardiovascular system: S1 & S2 heard, RRR. No JVD, murmurs, rubs, gallops or clicks. No pedal edema. Gastrointestinal system: Abdomen is nondistended, soft and nontender. No organomegaly or masses felt. Normal bowel sounds heard. Central nervous system: Alert and oriented. No focal neurological deficits. Extremities: Symmetric 5 x 5 power. Skin: No rashes, lesions or ulcers Psychiatry: Judgement and insight appear normal. Mood & affect appropriate.                Diet Orders (From admission, onward)     Start     Ordered   11/20/23 2311  Diet renal with fluid restriction Fluid restriction: 1200 mL Fluid; Room service appropriate? Yes; Fluid consistency: Thin  Diet effective now       Question Answer Comment  Fluid restriction: 1200 mL Fluid   Room service appropriate? Yes   Fluid consistency: Thin      11/20/23 2312            Objective: Vitals:   11/21/23 0400 11/21/23 0500 11/21/23 0730 11/21/23 0828  BP: (!) 130/56 99/60  ROLLEN)  118/57  Pulse: 94 84  86  Resp: (!) 25 13  17   Temp:   98 F (36.7 C)   TempSrc:   Oral   SpO2: 96% 95%  97%  Weight:      Height:        Intake/Output Summary (Last 24 hours) at 11/21/2023 1041 Last data filed at 11/21/2023 9171 Gross per 24 hour  Intake 1053.36 ml  Output 800 ml  Net 253.36 ml   Filed  Weights   11/18/23 0440 11/19/23 0500 11/20/23 0500  Weight: 87 kg 89.5 kg 86.3 kg    Scheduled Meds:  aspirin  EC  81 mg Oral Daily   Chlorhexidine  Gluconate Cloth  6 each Topical Daily   clopidogrel   75 mg Oral Daily   heparin   5,000 Units Subcutaneous Q8H   hydrocortisone  sod succinate (SOLU-CORTEF ) inj  50 mg Intravenous Q12H   insulin  aspart  0-15 Units Subcutaneous TID WC   insulin  aspart  0-5 Units Subcutaneous QHS   insulin  glargine  8 Units Subcutaneous BID   midodrine   5 mg Oral TID WC   pantoprazole   40 mg Oral Daily   rosuvastatin   10 mg Oral Daily   sodium bicarbonate   1,300 mg Oral TID   tacrolimus   3 mg Oral Daily   And   tacrolimus   4 mg Oral QHS   Continuous Infusions:  cefTRIAXone  (ROCEPHIN )  IV Stopped (11/20/23 1027)    Nutritional status     Body mass index is 34.8 kg/m.  Data Reviewed:   CBC: Recent Labs  Lab 11/17/23 0213 11/18/23 1039 11/19/23 0427 11/20/23 0440 11/21/23 0444  WBC 11.9* 10.9* 16.2* 12.1* 11.0*  HGB 8.7* 8.2* 8.8* 7.8* 7.4*  HCT 28.4* 26.1* 27.2* 23.9* 23.3*  MCV 87.7 85.6 84.0 83.0 86.0  PLT 366 333 464* 368 346   Basic Metabolic Panel: Recent Labs  Lab 11/17/23 0213 11/18/23 1039 11/19/23 0427 11/20/23 0440 11/21/23 0444  NA 140 139 138 141 137  K 3.6 4.3 3.9 3.4* 3.0*  CL 109 107 104 105 103  CO2 15* 15* 14* 18* 18*  GLUCOSE 160* 185* 232* 109* 360*  BUN 55* 72* 83* 91* 95*  CREATININE 3.97* 5.24* 5.72* 5.93* 5.84*  CALCIUM  9.2 9.2 9.2 9.3 9.1  MG 2.0  --  2.2 2.2  --   PHOS 3.1  --  5.4* 5.4* 5.9*   GFR: Estimated Creatinine Clearance: 11.2 mL/min (A) (by C-G formula based on SCr of 5.84 mg/dL (H)). Liver Function Tests: Recent Labs  Lab 11/16/23 2252 11/18/23 1039 11/19/23 0427 11/20/23 0440 11/21/23 0444  AST 17 17 22   --   --   ALT 17 18 25   --   --   ALKPHOS 58 57 61  --   --   BILITOT 1.0 1.0 1.1  --   --   PROT 6.3* 6.2* 6.7  --   --   ALBUMIN  3.1* 2.8* 3.0* 2.9* 2.8*   No results for  input(s): LIPASE, AMYLASE in the last 168 hours. No results for input(s): AMMONIA in the last 168 hours. Coagulation Profile: Recent Labs  Lab 11/16/23 2120  INR 1.0   Cardiac Enzymes: No results for input(s): CKTOTAL, CKMB, CKMBINDEX, TROPONINI in the last 168 hours. BNP (last 3 results) No results for input(s): PROBNP in the last 8760 hours. HbA1C: No results for input(s): HGBA1C in the last 72 hours. CBG: Recent Labs  Lab 11/20/23 9261 11/20/23 1152 11/20/23 1528 11/20/23 2356 11/21/23 9270  GLUCAP 122* 174* 259* 378* 292*   Lipid Profile: No results for input(s): CHOL, HDL, LDLCALC, TRIG, CHOLHDL, LDLDIRECT in the last 72 hours. Thyroid  Function Tests: No results for input(s): TSH, T4TOTAL, FREET4, T3FREE, THYROIDAB in the last 72 hours. Anemia Panel: Recent Labs    11/19/23 0427  FERRITIN 1,043*  TIBC 267  IRON 92   Sepsis Labs: Recent Labs  Lab 11/16/23 2056 11/16/23 2248 11/17/23 0213 11/17/23 1107 11/17/23 1350  PROCALCITON  --   --  0.57  --   --   LATICACIDVEN 1.1 0.8  --  1.5 1.2    Recent Results (from the past 240 hours)  Resp panel by RT-PCR (RSV, Flu A&B, Covid) Anterior Nasal Swab     Status: None   Collection Time: 11/16/23  8:16 PM   Specimen: Anterior Nasal Swab  Result Value Ref Range Status   SARS Coronavirus 2 by RT PCR NEGATIVE NEGATIVE Final   Influenza A by PCR NEGATIVE NEGATIVE Final   Influenza B by PCR NEGATIVE NEGATIVE Final    Comment: (NOTE) The Xpert Xpress SARS-CoV-2/FLU/RSV plus assay is intended as an aid in the diagnosis of influenza from Nasopharyngeal swab specimens and should not be used as a sole basis for treatment. Nasal washings and aspirates are unacceptable for Xpert Xpress SARS-CoV-2/FLU/RSV testing.  Fact Sheet for Patients: BloggerCourse.com  Fact Sheet for Healthcare Providers: SeriousBroker.it  This test is not  yet approved or cleared by the [place]  FDA and has been authorized for detection and/or diagnosis of SARS-CoV-2 by FDA under an Emergency Use Authorization (EUA). This EUA will remain in effect (meaning this test can be used) for the duration of the COVID-19 declaration under Section 564(b)(1) of the Act, 21 U.S.C. section 360bbb-3(b)(1), unless the authorization is terminated or revoked.     Resp Syncytial Virus by PCR NEGATIVE NEGATIVE Final    Comment: (NOTE) Fact Sheet for Patients: BloggerCourse.com  Fact Sheet for Healthcare Providers: SeriousBroker.it  This test is not yet approved or cleared by the [place]  FDA and has been authorized for detection and/or diagnosis of SARS-CoV-2 by FDA under an Emergency Use Authorization (EUA). This EUA will remain in effect (meaning this test can be used) for the duration of the COVID-19 declaration under Section 564(b)(1) of the Act, 21 U.S.C. section 360bbb-3(b)(1), unless the authorization is terminated or revoked.  Performed at [facility], [address], [place], [place] [address]   Blood culture (routine x 2)     Status: None   Collection Time: 11/16/23  8:34 PM   Specimen: BLOOD LEFT HAND  Result Value Ref Range Status   Specimen Description BLOOD LEFT HAND  Final   Special Requests   Final    BOTTLES DRAWN AEROBIC AND ANAEROBIC Blood Culture adequate volume   Culture   Final    NO GROWTH 5 DAYS Performed at [facility], [address], [place], [place] [address]    Report Status 11/21/2023 FINAL  Final  Blood culture (routine x 2)     Status: None   Collection Time: 11/16/23 10:52 PM   Specimen: BLOOD RIGHT HAND  Result Value Ref Range Status   Specimen Description BLOOD RIGHT HAND  Final   Special Requests   Final    BOTTLES DRAWN AEROBIC AND ANAEROBIC Blood Culture adequate volume   Culture   Final    NO GROWTH 5 DAYS Performed at [facility], [address], [place], [place]  [address]    Report Status 11/21/2023 FINAL  Final  MRSA Next Gen by PCR, Nasal     Status: None   Collection Time: 11/17/23 12:25 AM  Result Value Ref Range Status   MRSA by PCR Next Gen NOT DETECTED NOT DETECTED Final    Comment: (NOTE) The GeneXpert MRSA Assay (FDA approved for NASAL specimens only), is one component of a comprehensive MRSA colonization surveillance program. It is not intended to diagnose MRSA infection nor to guide or monitor treatment for MRSA infections. Test performance is not FDA approved in patients less than 36 years old. Performed at [facility], [address], [place], [place] [address]   Respiratory (~20 pathogens) panel by PCR     Status: None   Collection Time: 11/17/23 11:09 AM   Specimen: Nasopharyngeal Swab; Respiratory  Result Value Ref Range Status   Adenovirus NOT DETECTED NOT DETECTED Final   Coronavirus 229E NOT DETECTED NOT DETECTED Final    Comment: (NOTE) The Coronavirus on the Respiratory Panel, DOES NOT test for the novel  Coronavirus (2019 nCoV)    Coronavirus HKU1 NOT DETECTED NOT DETECTED Final   Coronavirus NL63 NOT DETECTED NOT DETECTED Final   Coronavirus OC43 NOT DETECTED NOT DETECTED Final   Metapneumovirus NOT DETECTED NOT DETECTED Final   Rhinovirus / Enterovirus NOT DETECTED NOT DETECTED Final   Influenza A NOT DETECTED NOT DETECTED Final   Influenza B NOT DETECTED NOT DETECTED Final   Parainfluenza Virus 1 NOT DETECTED NOT DETECTED Final   Parainfluenza Virus 2 NOT DETECTED NOT DETECTED Final   Parainfluenza Virus 3 NOT DETECTED NOT DETECTED Final   Parainfluenza Virus 4 NOT DETECTED NOT DETECTED Final   Respiratory Syncytial Virus NOT DETECTED NOT DETECTED Final   Bordetella pertussis NOT DETECTED NOT DETECTED Final   Bordetella Parapertussis NOT DETECTED NOT DETECTED Final   Chlamydophila pneumoniae NOT DETECTED NOT DETECTED Final   Mycoplasma pneumoniae NOT DETECTED  NOT DETECTED Final    Comment: Performed at [facility], [address], [place], [place] [address]         Radiology Studies: No results found.         LOS: 5 days   Time spent= 35 mins    Burgess JAYSON Dare, MD [facility]  If 7PM-7AM, please contact night-coverage  11/21/2023, 10:41 AM

## 2023-11-21 NOTE — Evaluation (Signed)
 Physical Therapy Evaluation Patient Details Name: Morgan [name] MRN: [id number] DOB: 1970/04/21 Today's Date: 11/21/2023  History of Present Illness  Pt is 53 y/o female admitted 11/16/23 due to fever, cough, chills admitted to ICU for refractory hypotension requiring vasopressors.  Of note: 10/08/2023: admitted due to acute hypoxic respiratory failure. She left AMA on 10/10/2023. 10/19/2023 for hypertensive emergency and pulm edema. 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 Pt is currently limited in safe mobility by poor safety awareness, generalized weakness and cognitive deficits. Pt is supervision for bed mobility and transfers and contact guard for ambulation with RW. PT recommending HHPT at discharge. PT will continue to follow acutely.       If plan is discharge home, recommend the following: A little help with walking and/or transfers;A little help with bathing/dressing/bathroom;Assistance with cooking/housework;Assistance with feeding;Direct supervision/assist for medications management;Direct supervision/assist for financial management;Assist for transportation;Help with stairs or ramp for entrance;Supervision due to cognitive status   Can travel by private vehicle    Yes    Equipment Recommendations Rolling walker (2 wheels)     Functional Status Assessment Patient has had a recent decline in their functional status and demonstrates the ability to make significant improvements in function in a reasonable and predictable amount of time.     Precautions / Restrictions Precautions Precautions: Fall Restrictions Weight Bearing Restrictions Per Provider Order:  No      Mobility  Bed Mobility Overal bed mobility: Needs Assistance Bed Mobility: Supine to Sit     Supine to sit: Supervision     General bed mobility comments: no physical assist    Transfers Overall transfer level: Needs assistance Equipment used: Rolling walker (2 wheels) Transfers: Sit to/from Stand, Bed to chair/wheelchair/BSC Sit to Stand: Supervision   Step pivot transfers: Supervision       General transfer comment: supervision for safety, no physical assist needed    Ambulation/Gait Ambulation/Gait assistance: Contact guard assist, +2 safety/equipment Gait Distance (Feet): 350 Feet Assistive device: Rolling walker (2 wheels) Gait Pattern/deviations: Step-through pattern, Drifts right/left Gait velocity: slowed Gait velocity interpretation: 1.31 - 2.62 ft/sec, indicative of limited community ambulator   General Gait Details: contact guard for safety, pt with poor way finding, but able to walk around unit with mildly unsteady gait, no overt LoB        Balance Overall balance assessment: Mild deficits observed, not formally tested                                           Pertinent Vitals/Pain Pain Assessment Pain Assessment: No/denies pain    Home Living Family/patient expects to be discharged to:: Private residence Living Arrangements: Spouse/significant other;Children Available Help at Discharge: Family;Available PRN/intermittently Type of Home: House (townhome) Home Access: Stairs to enter Entrance Stairs-Rails: None Entrance Stairs-Number of Steps: 3   Home Layout: Two level;Able to live on main level with bedroom/bathroom Home Equipment: Grab bars - tub/shower;Hand held shower head;Shower seat;Cane - single point      Prior Function Prior Level of Function : Independent/Modified Independent;Driving;Working/employed Advertising account planner)             Mobility  Comments: has a SPC se uses PRN ADLs Comments: mod I, only showers  when her husband is home for safety     Extremity/Trunk Assessment   Upper Extremity Assessment Upper Extremity Assessment: Defer to OT evaluation    Lower Extremity Assessment Lower Extremity Assessment: Overall WFL for tasks assessed    Cervical / Trunk Assessment Cervical / Trunk Assessment: Normal  Communication   Communication Communication: No apparent difficulties Factors Affecting Communication: Other (comment) (verbose)    Cognition Arousal: Alert Behavior During Therapy: WFL for tasks assessed/performed                             Following commands: Intact       Cueing Cueing Techniques: Verbal cues     General Comments General comments (skin integrity, edema, etc.): VSS on RA throughout session        Assessment/Plan    PT Assessment Patient needs continued PT services  PT Problem List Decreased strength;Decreased activity tolerance;Decreased balance;Decreased mobility;Decreased coordination;Decreased cognition;Decreased safety awareness;Decreased knowledge of use of DME       PT Treatment Interventions DME instruction;Gait training;Stair training;Functional mobility training;Therapeutic activities;Therapeutic exercise;Balance training;Cognitive remediation;Patient/family education    PT Goals (Current goals can be found in the Care Plan section)  Acute Rehab PT Goals PT Goal Formulation: With patient Time For Goal Achievement: 12/05/23 Potential to Achieve Goals: Good    Frequency Min 3X/week     Co-evaluation PT/OT/SLP Co-Evaluation/Treatment: Yes Reason for Co-Treatment: To address functional/ADL transfers;For patient/therapist safety PT goals addressed during session: Mobility/safety with mobility;Balance;Strengthening/ROM;Proper use of DME OT goals addressed during session: ADL's and self-care;Strengthening/ROM       AM-PAC PT 6 Clicks Mobility  Outcome Measure Help needed turning from your back to your side while in a  flat bed without using bedrails?: None Help needed moving from lying on your back to sitting on the side of a flat bed without using bedrails?: None Help needed moving to and from a bed to a chair (including a wheelchair)?: A Little Help needed standing up from a chair using your arms (e.g., wheelchair or bedside chair)?: A Little Help needed to walk in hospital room?: A Little Help needed climbing 3-5 steps with a railing? : A Lot 6 Click Score: 19    End of Session Equipment Utilized During Treatment: Gait belt Activity Tolerance: Patient tolerated treatment well Patient left: in chair;with call bell/phone within reach;with chair alarm set Nurse Communication: Mobility status PT Visit Diagnosis: Unsteadiness on feet (R26.81);Other abnormalities of gait and mobility (R26.89);Muscle weakness (generalized) (M62.81)    Time: 8956-8891 PT Time Calculation (min) (ACUTE ONLY): 25 min   Charges:   PT Evaluation $PT Eval Moderate Complexity: 1 Mod   PT General Charges $$ ACUTE PT VISIT: 1 Visit         Laekyn Rayos B. Fleeta Lapidus PT, DPT Acute Rehabilitation Services Please use secure chat or  Call Office [phone]   Morgan [name] 11/21/2023, 3:57 PM

## 2023-11-21 NOTE — Inpatient Diabetes Management (Signed)
 Inpatient Diabetes Program Recommendations  AACE/ADA: New Consensus Statement on Inpatient Glycemic Control (2015)  Target Ranges:  Prepandial:   less than 140 mg/dL      Peak postprandial:   less than 180 mg/dL (1-2 hours)      Critically ill patients:  140 - 180 mg/dL   Lab Results  Component Value Date   GLUCAP 266 (H) 11/21/2023   HGBA1C 8.6 (H) 10/20/2023    Review of Glycemic Control  Latest Reference Range & Units 11/20/23 23:56 11/21/23 07:29 11/21/23 11:35  Glucose-Capillary 70 - 99 mg/dL 621 (H) 707 (H) 733 (H)   Diabetes history: DM 2 Outpatient Diabetes medications:  Farxiga  10 mg daily Novolog  8-13 units tid with meals Tresiba  15 units bid Mounjaro 7.5 mg weekly Current orders for Inpatient glycemic control:  Novolog  0-15 units tid with meals and HS Solucortef 50 mg IV q 12 hours Lantus  8 units bid  Inpatient Diabetes Program Recommendations:    Please consider increasing Lantus  to 15 units bid and add Novolog  meal coverage 3 units tid with meals (hold if patient eats less than 50% or NPO).    Thanks,  Randall Bullocks, RN, BC-ADM Inpatient Diabetes Coordinator Pager [phone]  (8a-5p)

## 2023-11-21 NOTE — Evaluation (Signed)
 Occupational Therapy Evaluation Patient Details Name: Morgan [name] MRN: [id number] DOB: 1970-07-21 Today's Date: 11/21/2023   History of Present Illness   Pt is 53 y/o female admitted 11/16/23 due to fever, cough, chills admitted to ICU for refractory hypotension requiring vasopressors.  Of note: 10/08/2023: admitted due to acute hypoxic respiratory failure. She left AMA on 10/10/2023. 10/19/2023 for hypertensive emergency and pulm edema. 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     Clinical Impressions Pt is typically independent in ADL and mobility (SPC PRN), drives, and works as a Veterinary surgeon. Today she presents with generalized weakness, able to complete LB ADL from seated position with set up, UB with set up, transfers and mobility with increased support from RW at [facility]. Pt very verbose but able to maintain SpO2 >90% throughout session and activity. OT will follow acutely with focus on energy conservation education, activity tolerance, and education of AE. Do not anticipate the need for post-acute OT at this time.     If plan is discharge home, recommend the following:   A little help with walking and/or transfers;A little help with bathing/dressing/bathroom;Assist for transportation     Functional Status Assessment   Patient has had a recent decline in their functional status and demonstrates the ability to make significant improvements in function in a reasonable and predictable amount of time.     Equipment Recommendations   None recommended by OT (Pt has appropriate DME)     Recommendations for Other Services   PT consult     Precautions/Restrictions   Precautions Precautions: Fall Restrictions Weight Bearing Restrictions Per Provider  Order: No     Mobility Bed Mobility Overal bed mobility: Needs Assistance Bed Mobility: Supine to Sit     Supine to sit: Supervision     General bed mobility comments: no physical assist    Transfers Overall transfer level: Needs assistance Equipment used: Rolling walker (2 wheels) Transfers: Sit to/from Stand, Bed to chair/wheelchair/BSC Sit to Stand: Supervision     Step pivot transfers: Supervision     General transfer comment: supervision for safety, no physical assist needed      Balance Overall balance assessment: Mild deficits observed, not formally tested                                         ADL either performed or assessed with clinical judgement   ADL Overall ADL's : Needs assistance/impaired Eating/Feeding: Set up   Grooming: Contact guard assist;Sitting   Upper Body Bathing: Supervision/ safety;Sitting   Lower Body Bathing: Supervison/ safety;Sitting/lateral leans Lower Body Bathing Details (indicate cue type and reason): sits at baseline Upper Body Dressing : Minimal assistance Upper Body Dressing Details (indicate cue type and reason): line management Lower Body Dressing: Set up;Sitting/lateral leans Lower Body Dressing Details (indicate cue type and reason): socks Toilet Transfer: Contact guard assist;Ambulation;Cueing for safety;Rolling walker (2 wheels) Toilet Transfer Details (indicate cue type and reason): RW Toileting- Clothing Manipulation and Hygiene: Contact guard assist;Sit to/from stand       Functional mobility during ADLs: Contact guard assist;Rolling walker (2 wheels) General ADL Comments: decreased activity tolerance     Vision Ability to See in Adequate Light: 0 Adequate Patient Visual Report: No  change from baseline Vision Assessment?: No apparent visual deficits     Perception         Praxis         Pertinent Vitals/Pain Pain Assessment Pain Assessment: No/denies pain     Extremity/Trunk  Assessment Upper Extremity Assessment Upper Extremity Assessment: Overall WFL for tasks assessed;Generalized weakness   Lower Extremity Assessment Lower Extremity Assessment: Defer to PT evaluation       Communication Communication Communication: No apparent difficulties Factors Affecting Communication: Other (comment) (verbose)   Cognition Arousal: Alert Behavior During Therapy: WFL for tasks assessed/performed Cognition: History of cognitive impairments             OT - Cognition Comments: verbose, pleasant, cooperative                 Following commands: Intact       Cueing  General Comments   Cueing Techniques: Verbal cues  VSS on RA throughout session   Exercises     Shoulder Instructions      Home Living Family/patient expects to be discharged to:: Private residence Living Arrangements: Spouse/significant other;Children Available Help at Discharge: Family;Available PRN/intermittently Type of Home: House (townhome) Home Access: Stairs to enter [employer] of Steps: 3 Entrance Stairs-Rails: None Home Layout: Two level;Able to live on main level with bedroom/bathroom     Bathroom Shower/Tub: Tub/shower unit;Curtain   Bathroom Toilet: Standard     Home Equipment: Grab bars - tub/shower;Hand held shower head;Shower seat;Cane - single point          Prior Functioning/Environment Prior Level of Function : Independent/Modified Independent;Driving;Working/employed Advertising account planner)             Mobility Comments: has a SPC se uses PRN ADLs Comments: mod I, only showers when her husband is home for safety    OT Problem List: Decreased strength;Decreased activity tolerance;Impaired balance (sitting and/or standing);Decreased safety awareness;Cardiopulmonary status limiting activity;Obesity   OT Treatment/Interventions: Self-care/ADL training;Therapeutic exercise;Energy conservation;DME and/or AE instruction;Therapeutic  activities;Patient/family education;Balance training      OT Goals(Current goals can be found in the care plan section)   Acute Rehab OT Goals Patient Stated Goal: get home and celebrate her birthday OT Goal Formulation: With patient Time For Goal Achievement: 12/05/23 Potential to Achieve Goals: Good ADL Goals Pt Will Perform Grooming: with modified independence;standing Pt Will Transfer to Toilet: with modified independence;ambulating Pt Will Perform Toileting - Clothing Manipulation and hygiene: with modified independence;sit to/from stand Additional ADL Goal #1: Pt will verbalize at least 3 ways to conserve energy during ADL with no cues   OT Frequency:  Min 2X/week    Co-evaluation PT/OT/SLP Co-Evaluation/Treatment: Yes Reason for Co-Treatment: To address functional/ADL transfers;For patient/therapist safety PT goals addressed during session: Mobility/safety with mobility;Balance;Strengthening/ROM;Proper use of DME OT goals addressed during session: ADL's and self-care;Strengthening/ROM      AM-PAC OT 6 Clicks Daily Activity     Outcome Measure Help from another person eating meals?: None Help from another person taking care of personal grooming?: A Little Help from another person toileting, which includes using toliet, bedpan, or urinal?: A Little Help from another person bathing (including washing, rinsing, drying)?: A Little Help from another person to put on and taking off regular upper body clothing?: A Little Help from another person to put on and taking off regular lower body clothing?: A Little 6 Click Score: 19   End of Session Equipment Utilized During Treatment: Gait belt;Rolling walker (2 wheels) Nurse Communication: Mobility status  Activity Tolerance: Patient  tolerated treatment well Patient left: in chair;with call bell/phone within reach;with chair alarm set  OT Visit Diagnosis: Unsteadiness on feet (R26.81);Other abnormalities of gait and mobility  (R26.89);Muscle weakness (generalized) (M62.81)                Time: 8956-8891 OT Time Calculation (min): 25 min Charges:  OT General Charges $OT Visit: 1 Visit OT Evaluation $OT Eval Moderate Complexity: 1 Mod  Morgan DEL OTR/L Acute Rehabilitation Services Office: [phone]  Morgan [name] 11/21/2023, 1:36 PM

## 2023-11-22 DIAGNOSIS — A419 Sepsis, unspecified organism: Secondary | ICD-10-CM | POA: Diagnosis not present

## 2023-11-22 DIAGNOSIS — R6521 Severe sepsis with septic shock: Secondary | ICD-10-CM | POA: Diagnosis not present

## 2023-11-22 DIAGNOSIS — J9601 Acute respiratory failure with hypoxia: Secondary | ICD-10-CM | POA: Diagnosis not present

## 2023-11-22 DIAGNOSIS — Z940 Kidney transplant status: Secondary | ICD-10-CM | POA: Diagnosis not present

## 2023-11-22 LAB — FOLATE: Folate: >20 ng/mL (ref >5.9)

## 2023-11-22 LAB — MAGNESIUM: Magnesium: 2 mg/dL (ref 1.7–2.4)

## 2023-11-22 LAB — BASIC METABOLIC PANEL WITH GFR
Anion gap: 16 — ABNORMAL HIGH (ref 5–15)
BUN: 92 mg/dL — ABNORMAL HIGH (ref 6–20)
CO2: 18 mmol/L — ABNORMAL LOW (ref 22–32)
Calcium: 9.7 mg/dL (ref 8.9–10.3)
Chloride: 101 mmol/L (ref 98–111)
Creatinine, Ser: 5.47 mg/dL — ABNORMAL HIGH (ref 0.44–1.00)
GFR, Estimated: 9 mL/min — ABNORMAL LOW (ref >60)
Glucose, Bld: 293 mg/dL — ABNORMAL HIGH (ref 70–99)
Potassium: 3.2 mmol/L — ABNORMAL LOW (ref 3.5–5.1)
Sodium: 135 mmol/L (ref 135–145)

## 2023-11-22 LAB — CBC
HCT: 26 % — ABNORMAL LOW (ref 36.0–46.0)
Hemoglobin: 8.6 g/dL — ABNORMAL LOW (ref 12.0–15.0)
MCH: 27.6 pg (ref 26.0–34.0)
MCHC: 33.1 g/dL (ref 30.0–36.0)
MCV: 83.3 fL (ref 80.0–100.0)
Platelets: 365 K/uL (ref 150–400)
RBC: 3.12 MIL/uL — ABNORMAL LOW (ref 3.87–5.11)
RDW: 13.9 % (ref 11.5–15.5)
WBC: 12 K/uL — ABNORMAL HIGH (ref 4.0–10.5)
nRBC: 0.2 % (ref 0.0–0.2)

## 2023-11-22 LAB — GLUCOSE, CAPILLARY
Glucose-Capillary: 247 mg/dL — ABNORMAL HIGH (ref 70–99)
Glucose-Capillary: 263 mg/dL — ABNORMAL HIGH (ref 70–99)
Glucose-Capillary: 213 mg/dL — ABNORMAL HIGH (ref 70–99)
Glucose-Capillary: 243 mg/dL — ABNORMAL HIGH (ref 70–99)

## 2023-11-22 LAB — VITAMIN D 25 HYDROXY (VIT D DEFICIENCY, FRACTURES): Vit D, 25-Hydroxy: 29.2 ng/mL — ABNORMAL LOW (ref 30–100)

## 2023-11-22 LAB — VITAMIN B12: Vitamin B-12: 1135 pg/mL — ABNORMAL HIGH (ref 180–914)

## 2023-11-22 LAB — C-REACTIVE PROTEIN: CRP: 1.6 mg/dL — ABNORMAL HIGH (ref <1.0)

## 2023-11-22 LAB — BRAIN NATRIURETIC PEPTIDE: B Natriuretic Peptide: 1173.1 pg/mL — ABNORMAL HIGH (ref 0.0–100.0)

## 2023-11-22 MED ORDER — PREDNISONE 50 MG PO TABS
50.0000 mg | ORAL_TABLET | Freq: Every day | ORAL | Status: DC
Start: 2023-11-22 — End: 2023-11-23
  Administered 2023-11-22 – 2023-11-23 (×2): 50 mg via ORAL
  Filled 2023-11-22 (×2): qty 1

## 2023-11-22 NOTE — Progress Notes (Signed)
 Admit: 11/16/2023 LOS: 6  46F status post DDKT with AKI on CKD 5 with septic shock from ? pneumonia  Subjective:  Seen at bedside, no interval events Creatinine slightly improved and urine output not fully quantified/recorded Feels well  09/25 0701 - 09/26 0700 In: 480 [P.O.:480] Out: 200 [Urine:200]  Filed Weights   11/18/23 0440 11/19/23 0500 11/20/23 0500  Weight: 87 kg 89.5 kg 86.3 kg    Scheduled Meds:  aspirin  EC  81 mg Oral Daily   Chlorhexidine  Gluconate Cloth  6 each Topical Daily   clopidogrel   75 mg Oral Daily   heparin   5,000 Units Subcutaneous Q8H   insulin  aspart  0-15 Units Subcutaneous TID WC   insulin  aspart  0-5 Units Subcutaneous QHS   insulin  aspart  4 Units Subcutaneous TID WC   insulin  glargine  8 Units Subcutaneous BID   midodrine   5 mg Oral TID WC   multivitamin  1 tablet Oral QHS   mycophenolate   250 mg Oral BID   pantoprazole   40 mg Oral Daily   predniSONE   50 mg Oral Q breakfast   rosuvastatin   10 mg Oral Daily   sodium bicarbonate   1,300 mg Oral TID   tacrolimus   3 mg Oral Daily   And   tacrolimus   4 mg Oral QHS   Continuous Infusions:   PRN Meds:.acetaminophen , docusate sodium , glucagon  (human recombinant), hydrALAZINE , ipratropium-albuterol , metoprolol  tartrate, ondansetron  (ZOFRAN ) IV, mouth rinse, polyethylene glycol, prochlorperazine   Current Labs: reviewed   Physical Exam:  Blood pressure (!) 92/54, pulse 87, temperature 98 F (36.7 C), temperature source Oral, resp. rate 18, height 5' 2 (1.575 m), weight 86.3 kg, SpO2 100%. 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 11/19/2023 transplant ultrasound with no  hydronephrosis, perinephric fluid collection, Remains on tacrolimus , MMF now resumed at dosing less than outpatient, on stress dose steroids with baseline prednisone  5 mg daily as an outpatient, tapering towards GFR low but may be stabilizing, no dialysis today Septic shock from possible pulmonary cause remaining on low-dose pressors and on room air, on midodrine , improving Chronic immunosuppression Increased anion gap metabolic acidosis on sodium bicarbonate  therapy, CTM  P As above, no HD at this time, continue close observation and supportive care, maybe we will get through this without dialysis at least in the inpatient setting but very likely to be needed in the near future. Continue MMF and tacrolimus  at current dosing and would continue reduction in prednisone  towards 5 mg daily. Medication Issues; Preferred narcotic agents for pain control are hydromorphone , fentanyl , and methadone. Morphine  should not be used.  Baclofen should be avoided Avoid oral sodium phosphate and magnesium citrate based laxatives / bowel preps    Bernardino Gasman MD 11/22/2023, 12:28 PM  Recent Labs  Lab 11/19/23 0427 11/20/23 0440 11/21/23 0444 11/22/23 0835  NA 138 141 137 135  K 3.9 3.4* 3.0* 3.2*  CL 104 105 103 101  CO2 14* 18* 18* 18*  GLUCOSE 232* 109* 360* 293*  BUN 83* 91* 95* 92*  CREATININE 5.72* 5.93* 5.84* 5.47*  CALCIUM  9.2 9.3 9.1 9.7  PHOS 5.4* 5.4* 5.9*  --    Recent Labs  Lab 11/20/23 0440 11/21/23 0444 11/22/23 9164  WBC 12.1* 11.0* 12.0*  HGB 7.8* 7.4* 8.6*  HCT 23.9* 23.3* 26.0*  MCV 83.0 86.0 83.3  PLT 368 346 365

## 2023-11-22 NOTE — Progress Notes (Signed)
 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  Initially noted to be in septic shock secondary to pneumonia requiring ICU admission now on broad-spectrum antibiotics and requiring pressors.  She was also placed on heated high flow.  9/26: Hemodynamically stable on room air, blood pressures stable on midodrine , switching Solu-Medrol  to p.o. prednisone , needed taper to baseline prednisone  use of 5 mg per nephrology. Can be discharged in 1 to 2 days if renal function remains stable.  Assessment & Plan:  Septic shock secondary to left lower lobe pneumonia Acute respiratory failure - Shock physiology has resolved.  Currently on Rocephin  and azithromycin , to complete total 5-day course.  Cultures remain negative to date, now weaned down to room air.  MRSA/RVP, urine strep and Legionella are negative.  Echocardiogram shows preserved EF.  Continue bronchodilators, I-S and flutter valve - Initiated on stress dose steroids with plan to slowly wean down to daily prednisone  5 mg - On midodrine  5 mg 3 times daily, will adjust as needed.   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  Required Lasix , seen by nephrology team here.  Continue bicarb, no need for dialysis.  Continue immunosuppressants -Has a patent right upper extremity AV graft.  Ultrasound renal transplant with Doppler overall unremarkable  Chronic congestive heart failure with preserved EF 55% Coronary artery  disease Hyperlipidemia - Continue aspirin , statin, Plavix  - Norvasc  on hold  Essential hypertension - Norvasc  currently on hold.  IV as needed  Diabetes mellitus type 2, A1c 8.6 - Sliding scale and Accu-Cheks.  Adjust long-acting as necessary  Anemia of chronic disease - Continue to monitor hemoglobin closely  DVT prophylaxis: SQ Heparin     Code Status: Full Code Family Communication:   Status is: Inpatient Remains inpatient appropriate because: Continue hospital stay for at least next 24 hours  PT Follow up Recs: Home health  Subjective: Patient was seen and examined today.  No new concern  Examination: General.  Obese lady, in no acute distress. Pulmonary.  Lungs clear bilaterally, normal respiratory effort. CV.  Regular rate and rhythm, no JVD, rub or murmur. Abdomen.  Soft, nontender, nondistended, BS positive. CNS.  Alert and oriented .  No focal neurologic deficit. Extremities.  Trace LE edema, pulses intact and symmetrical. Psychiatry.  Judgment and insight appears normal.     Diet Orders (From admission, onward)     Start     Ordered   11/21/23 1632  Diet Carb Modified Fluid consistency: Thin; Room service appropriate? Yes with Assist  Diet effective now       Question Answer Comment  Diet-HS Snack? Nothing   Calorie Level Medium 1600-2000   Fluid consistency: Thin   Room service appropriate? Yes with Assist      11/21/23 1633            Objective: Vitals:   11/21/23 1435 11/21/23 1948 11/22/23 0405 11/22/23 1223  BP: (!) 144/81 (!) 145/95 101/78 (!) 92/54  Pulse: 96 98 86 87  Resp: 18 17 17  18  Temp: 98 F (36.7 C) 98 F (36.7 C) 98.3 F (36.8 C) 98 F (36.7 C)  TempSrc: Oral Oral Oral Oral  SpO2: 97% 100% 95% 100%  Weight:      Height:        Intake/Output Summary (Last 24 hours) at 11/22/2023 1754 Last data filed at 11/22/2023 0900 Gross per 24 hour  Intake --  Output 500 ml  Net -500 ml   Filed Weights   11/18/23 0440 11/19/23  0500 11/20/23 0500  Weight: 87 kg 89.5 kg 86.3 kg    Scheduled Meds:  aspirin  EC  81 mg Oral Daily   Chlorhexidine  Gluconate Cloth  6 each Topical Daily   clopidogrel   75 mg Oral Daily   heparin   5,000 Units Subcutaneous Q8H   insulin  aspart  0-15 Units Subcutaneous TID WC   insulin  aspart  0-5 Units Subcutaneous QHS   insulin  aspart  4 Units Subcutaneous TID WC   insulin  glargine  8 Units Subcutaneous BID   midodrine   5 mg Oral TID WC   multivitamin  1 tablet Oral QHS   mycophenolate   250 mg Oral BID   pantoprazole   40 mg Oral Daily   predniSONE   50 mg Oral Q breakfast   rosuvastatin   10 mg Oral Daily   sodium bicarbonate   1,300 mg Oral TID   tacrolimus   3 mg Oral Daily   And   tacrolimus   4 mg Oral QHS   Continuous Infusions:   Nutritional status Signs/Symptoms: per patient/family report Interventions: Refer to RD note for recommendations Body mass index is 34.8 kg/m.  Data Reviewed:   CBC: Recent Labs  Lab 11/18/23 1039 11/19/23 0427 11/20/23 0440 11/21/23 0444 11/22/23 0835  WBC 10.9* 16.2* 12.1* 11.0* 12.0*  HGB 8.2* 8.8* 7.8* 7.4* 8.6*  HCT 26.1* 27.2* 23.9* 23.3* 26.0*  MCV 85.6 84.0 83.0 86.0 83.3  PLT 333 464* 368 346 365   Basic Metabolic Panel: Recent Labs  Lab 11/17/23 0213 11/18/23 1039 11/19/23 0427 11/20/23 0440 11/21/23 0444 11/22/23 0835  NA 140 139 138 141 137 135  K 3.6 4.3 3.9 3.4* 3.0* 3.2*  CL 109 107 104 105 103 101  CO2 15* 15* 14* 18* 18* 18*  GLUCOSE 160* 185* 232* 109* 360* 293*  BUN 55* 72* 83* 91* 95* 92*  CREATININE 3.97* 5.24* 5.72* 5.93* 5.84* 5.47*  CALCIUM  9.2 9.2 9.2 9.3 9.1 9.7  MG 2.0  --  2.2 2.2  --  2.0  PHOS 3.1  --  5.4* 5.4* 5.9*  --    GFR: Estimated Creatinine Clearance: 12 mL/min (A) (by C-G formula based on SCr of 5.47 mg/dL (H)). Liver Function Tests: Recent Labs  Lab 11/16/23 2252 11/18/23 1039 11/19/23 0427 11/20/23 0440 11/21/23 0444  AST 17 17 22   --   --   ALT 17 18 25   --   --    ALKPHOS 58 57 61  --   --   BILITOT 1.0 1.0 1.1  --   --   PROT 6.3* 6.2* 6.7  --   --   ALBUMIN  3.1* 2.8* 3.0* 2.9* 2.8*   No results for input(s): LIPASE, AMYLASE in the last 168 hours. No results for input(s): AMMONIA in the last 168 hours. Coagulation Profile: Recent Labs  Lab 11/16/23 2120  INR 1.0   Cardiac Enzymes: No results for input(s): CKTOTAL, CKMB, CKMBINDEX, TROPONINI in the last 168 hours. BNP (last 3 results) No results for input(s): PROBNP in the last  8760 hours. HbA1C: No results for input(s): HGBA1C in the last 72 hours. CBG: Recent Labs  Lab 11/21/23 1736 11/21/23 1958 11/22/23 0800 11/22/23 1139 11/22/23 1656  GLUCAP 288* 274* 247* 263* 213*   Lipid Profile: No results for input(s): CHOL, HDL, LDLCALC, TRIG, CHOLHDL, LDLDIRECT in the last 72 hours. Thyroid  Function Tests: No results for input(s): TSH, T4TOTAL, FREET4, T3FREE, THYROIDAB in the last 72 hours. Anemia Panel: Recent Labs    11/22/23 0835  VITAMINB12 1,135*  FOLATE >20.0   Sepsis Labs: Recent Labs  Lab 11/16/23 2056 11/16/23 2248 11/17/23 0213 11/17/23 1107 11/17/23 1350  PROCALCITON  --   --  0.57  --   --   LATICACIDVEN 1.1 0.8  --  1.5 1.2    Recent Results (from the past 240 hours)  Resp panel by RT-PCR (RSV, Flu A&B, Covid) Anterior Nasal Swab     Status: None   Collection Time: 11/16/23  8:16 PM   Specimen: Anterior Nasal Swab  Result Value Ref Range Status   SARS Coronavirus 2 by RT PCR NEGATIVE NEGATIVE Final   Influenza A by PCR NEGATIVE NEGATIVE Final   Influenza B by PCR NEGATIVE NEGATIVE Final    Comment: (NOTE) The Xpert Xpress SARS-CoV-2/FLU/RSV plus assay is intended as an aid in the diagnosis of influenza from Nasopharyngeal swab specimens and should not be used as a sole basis for treatment. Nasal washings and aspirates are unacceptable for Xpert Xpress SARS-CoV-2/FLU/RSV testing.  Fact Sheet for  Patients: BloggerCourse.com  Fact Sheet for Healthcare Providers: SeriousBroker.it  This test is not yet approved or cleared by the [place]  FDA and has been authorized for detection and/or diagnosis of SARS-CoV-2 by FDA under an Emergency Use Authorization (EUA). This EUA will remain in effect (meaning this test can be used) for the duration of the COVID-19 declaration under Section 564(b)(1) of the Act, 21 U.S.C. section 360bbb-3(b)(1), unless the authorization is terminated or revoked.     Resp Syncytial Virus by PCR NEGATIVE NEGATIVE Final    Comment: (NOTE) Fact Sheet for Patients: BloggerCourse.com  Fact Sheet for Healthcare Providers: SeriousBroker.it  This test is not yet approved or cleared by the [place]  FDA and has been authorized for detection and/or diagnosis of SARS-CoV-2 by FDA under an Emergency Use Authorization (EUA). This EUA will remain in effect (meaning this test can be used) for the duration of the COVID-19 declaration under Section 564(b)(1) of the Act, 21 U.S.C. section 360bbb-3(b)(1), unless the authorization is terminated or revoked.  Performed at [facility], [address] [place], [place] [address]   Blood culture (routine x 2)     Status: None   Collection Time: 11/16/23  8:34 PM   Specimen: BLOOD LEFT HAND  Result Value Ref Range Status   Specimen Description BLOOD LEFT HAND  Final   Special Requests   Final    BOTTLES DRAWN AEROBIC AND ANAEROBIC Blood Culture adequate volume   Culture   Final    NO GROWTH 5 DAYS Performed at [facility], [address] [place], [place] [address]    Report Status 11/21/2023 FINAL  Final  Blood culture (routine x 2)     Status: None   Collection Time: 11/16/23 10:52 PM   Specimen: BLOOD RIGHT HAND  Result Value Ref Range Status   Specimen Description BLOOD RIGHT HAND  Final    Special Requests   Final    BOTTLES DRAWN AEROBIC AND ANAEROBIC Blood Culture adequate  volume   Culture   Final    NO GROWTH 5 DAYS Performed at [facility], [address] [place], [place] [address]    Report Status 11/21/2023 FINAL  Final  MRSA Next Gen by PCR, Nasal     Status: None   Collection Time: 11/17/23 12:25 AM  Result Value Ref Range Status   MRSA by PCR Next Gen NOT DETECTED NOT DETECTED Final    Comment: (NOTE) The GeneXpert MRSA Assay (FDA approved for NASAL specimens only), is one component of a comprehensive MRSA colonization surveillance program. It is not intended to diagnose MRSA infection nor to guide or monitor treatment for MRSA infections. Test performance is not FDA approved in patients less than 73 years old. Performed at [facility], [address] [place], [place] [address]   Respiratory (~20 pathogens) panel by PCR     Status: None   Collection Time: 11/17/23 11:09 AM   Specimen: Nasopharyngeal Swab; Respiratory  Result Value Ref Range Status   Adenovirus NOT DETECTED NOT DETECTED Final   Coronavirus 229E NOT DETECTED NOT DETECTED Final    Comment: (NOTE) The Coronavirus on the Respiratory Panel, DOES NOT test for the novel  Coronavirus (2019 nCoV)    Coronavirus HKU1 NOT DETECTED NOT DETECTED Final   Coronavirus NL63 NOT DETECTED NOT DETECTED Final   Coronavirus OC43 NOT DETECTED NOT DETECTED Final   Metapneumovirus NOT DETECTED NOT DETECTED Final   Rhinovirus / Enterovirus NOT DETECTED NOT DETECTED Final   Influenza A NOT DETECTED NOT DETECTED Final   Influenza B NOT DETECTED NOT DETECTED Final   Parainfluenza Virus 1 NOT DETECTED NOT DETECTED Final   Parainfluenza Virus 2 NOT DETECTED NOT DETECTED Final   Parainfluenza Virus 3 NOT DETECTED NOT DETECTED Final   Parainfluenza Virus 4 NOT DETECTED NOT DETECTED Final   Respiratory Syncytial Virus NOT DETECTED NOT DETECTED Final   Bordetella pertussis NOT DETECTED NOT DETECTED Final    Bordetella Parapertussis NOT DETECTED NOT DETECTED Final   Chlamydophila pneumoniae NOT DETECTED NOT DETECTED Final   Mycoplasma pneumoniae NOT DETECTED NOT DETECTED Final    Comment: Performed at [facility], [address], [place], [place] [address]     Radiology Studies: No results found.   LOS: 6 days   Time spent= 40 mins  This record has been created using Conservation officer, historic buildings. Errors have been sought and corrected,but may not always be located. Such creation errors do not reflect on the standard of care.   Amaryllis Dare, MD [facility]  If 7PM-7AM, please contact night-coverage  11/22/2023, 5:54 PM

## 2023-11-23 ENCOUNTER — Other Ambulatory Visit (HOSPITAL_COMMUNITY): Payer: Self-pay

## 2023-11-23 DIAGNOSIS — J9601 Acute respiratory failure with hypoxia: Secondary | ICD-10-CM | POA: Diagnosis not present

## 2023-11-23 DIAGNOSIS — D849 Immunodeficiency, unspecified: Secondary | ICD-10-CM

## 2023-11-23 DIAGNOSIS — N189 Chronic kidney disease, unspecified: Secondary | ICD-10-CM

## 2023-11-23 DIAGNOSIS — A419 Sepsis, unspecified organism: Secondary | ICD-10-CM | POA: Diagnosis not present

## 2023-11-23 DIAGNOSIS — J810 Acute pulmonary edema: Secondary | ICD-10-CM

## 2023-11-23 LAB — CBC
HCT: 26.2 % — ABNORMAL LOW (ref 36.0–46.0)
Hemoglobin: 8.4 g/dL — ABNORMAL LOW (ref 12.0–15.0)
MCH: 26.8 pg (ref 26.0–34.0)
MCHC: 32.1 g/dL (ref 30.0–36.0)
MCV: 83.7 fL (ref 80.0–100.0)
Platelets: 376 K/uL (ref 150–400)
RBC: 3.13 MIL/uL — ABNORMAL LOW (ref 3.87–5.11)
RDW: 14 % (ref 11.5–15.5)
WBC: 13.2 K/uL — ABNORMAL HIGH (ref 4.0–10.5)
nRBC: 0.2 % (ref 0.0–0.2)

## 2023-11-23 LAB — RENAL FUNCTION PANEL
Albumin: 3 g/dL — ABNORMAL LOW (ref 3.5–5.0)
Anion gap: 18 — ABNORMAL HIGH (ref 5–15)
BUN: 94 mg/dL — ABNORMAL HIGH (ref 6–20)
CO2: 19 mmol/L — ABNORMAL LOW (ref 22–32)
Calcium: 9.4 mg/dL (ref 8.9–10.3)
Chloride: 100 mmol/L (ref 98–111)
Creatinine, Ser: 5.13 mg/dL — ABNORMAL HIGH (ref 0.44–1.00)
GFR, Estimated: 9 mL/min — ABNORMAL LOW (ref >60)
Glucose, Bld: 193 mg/dL — ABNORMAL HIGH (ref 70–99)
Phosphorus: 5.5 mg/dL — ABNORMAL HIGH (ref 2.5–4.6)
Potassium: 3.2 mmol/L — ABNORMAL LOW (ref 3.5–5.1)
Sodium: 137 mmol/L (ref 135–145)

## 2023-11-23 LAB — BASIC METABOLIC PANEL WITH GFR
Anion gap: 16 — ABNORMAL HIGH (ref 5–15)
BUN: 94 mg/dL — ABNORMAL HIGH (ref 6–20)
CO2: 19 mmol/L — ABNORMAL LOW (ref 22–32)
Calcium: 9.3 mg/dL (ref 8.9–10.3)
Chloride: 101 mmol/L (ref 98–111)
Creatinine, Ser: 5.23 mg/dL — ABNORMAL HIGH (ref 0.44–1.00)
GFR, Estimated: 9 mL/min — ABNORMAL LOW (ref >60)
Glucose, Bld: 193 mg/dL — ABNORMAL HIGH (ref 70–99)
Potassium: 3.2 mmol/L — ABNORMAL LOW (ref 3.5–5.1)
Sodium: 136 mmol/L (ref 135–145)

## 2023-11-23 LAB — MAGNESIUM: Magnesium: 2 mg/dL (ref 1.7–2.4)

## 2023-11-23 MED ORDER — RENA-VITE PO TABS
1.0000 | ORAL_TABLET | Freq: Every day | ORAL | 0 refills | Status: AC
  Filled 2023-11-23: qty 90, 90d supply, fill #0

## 2023-11-23 MED ORDER — MIDODRINE HCL 5 MG PO TABS
5.0000 mg | ORAL_TABLET | Freq: Three times a day (TID) | ORAL | 0 refills | Status: AC
  Filled 2023-11-23: qty 90, 30d supply, fill #0

## 2023-11-23 MED ORDER — PREDNISONE 10 MG PO TABS
ORAL_TABLET | ORAL | 0 refills | Status: DC
  Filled 2023-11-23: qty 60, 18d supply, fill #0

## 2023-11-23 MED ORDER — POTASSIUM CHLORIDE CRYS ER 20 MEQ PO TBCR
30.0000 meq | EXTENDED_RELEASE_TABLET | Freq: Once | ORAL | Status: AC
  Administered 2023-11-23: 30 meq via ORAL
  Filled 2023-11-23: qty 1

## 2023-11-23 MED ORDER — PANTOPRAZOLE SODIUM 40 MG PO TBEC
40.0000 mg | DELAYED_RELEASE_TABLET | Freq: Every day | ORAL | 1 refills | Status: AC
  Filled 2023-11-23: qty 30, 30d supply, fill #0

## 2023-11-23 MED ORDER — SODIUM BICARBONATE 650 MG PO TABS
1300.0000 mg | ORAL_TABLET | Freq: Three times a day (TID) | ORAL | 1 refills | Status: AC
  Filled 2023-11-23: qty 180, 30d supply, fill #0

## 2023-11-23 MED ORDER — MYCOPHENOLATE MOFETIL 250 MG PO CAPS
250.0000 mg | ORAL_CAPSULE | Freq: Two times a day (BID) | ORAL | 1 refills | Status: AC
  Filled 2023-11-23: qty 60, 30d supply, fill #0

## 2023-11-23 NOTE — Progress Notes (Signed)
 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 pneumonia  Subjective:  Seen at bedside, no interval events Creatinine further improved to 5.1, BUN 94, hemoglobin 8.4 Made 1.4 L of urine overnight Feels well  09/26 0701 - 09/27 0700 In: 670 [P.O.:670] Out: 1350 [Urine:1350]  Filed Weights   11/18/23 0440 11/19/23 0500 11/20/23 0500  Weight: 87 kg 89.5 kg 86.3 kg    Scheduled Meds:  aspirin  EC  81 mg Oral Daily   Chlorhexidine  Gluconate Cloth  6 each Topical Daily   clopidogrel   75 mg Oral Daily   heparin   5,000 Units Subcutaneous Q8H   insulin  aspart  0-15 Units Subcutaneous TID WC   insulin  aspart  0-5 Units Subcutaneous QHS   insulin  aspart  4 Units Subcutaneous TID WC   insulin  glargine  8 Units Subcutaneous BID   midodrine   5 mg Oral TID WC   multivitamin  1 tablet Oral QHS   mycophenolate   250 mg Oral BID   pantoprazole   40 mg Oral Daily   predniSONE   50 mg Oral Q breakfast   rosuvastatin   10 mg Oral Daily   sodium bicarbonate   1,300 mg Oral TID   tacrolimus   3 mg Oral Daily   And   tacrolimus   4 mg Oral QHS   Continuous Infusions:   PRN Meds:.acetaminophen , docusate sodium , glucagon  (human recombinant), hydrALAZINE , ipratropium-albuterol , metoprolol  tartrate, ondansetron  (ZOFRAN ) IV, mouth rinse, polyethylene glycol, prochlorperazine   Current Labs: reviewed   Physical Exam:  Blood pressure (!) 125/59, pulse 87, temperature 97.9 F (36.6 C), resp. rate 18, height 5' 2 (1.575 m), weight 86.3 kg, SpO2 100%. 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 11/19/2023 transplant ultrasound with no  hydronephrosis, perinephric fluid collection Remains on tacrolimus , MMF now resumed at dosing less than outpatient, on stress dose steroids with baseline prednisone  5 mg daily as an outpatient, tapering towards GFR low but  stabilizing,  Septic shock from possible pulmonary cause remaining on low-dose pressors and on room air, on midodrine , improving Chronic immunosuppression Increased anion gap metabolic acidosis on sodium bicarbonate  therapy, CTM  P She is recovering sufficient GFR to avoid dialysis at the current time Okay with discharge and already has close follow-up on 10/14 with Dr. Tobie I will arrange transplant labs to be checked early next week including a tacrolimus  level I would discharge with a prednisone  taper back down to 5 mg daily by reducing dosing of prednisone  by about 10 mg every 2 days Continue MMF at reduced dose at the current time    Bernardino Gasman MD 11/23/2023, 9:37 AM  Recent Labs  Lab 11/20/23 0440 11/21/23 0444 11/22/23 0835 11/23/23 0747 11/23/23 0815  NA 141 137 135 136 137  K 3.4* 3.0* 3.2* 3.2* 3.2*  CL 105 103 101 101 100  CO2 18* 18* 18* 19* 19*  GLUCOSE 109* 360* 293* 193* 193*  BUN 91* 95* 92* 94* 94*  CREATININE 5.93* 5.84* 5.47* 5.23* 5.13*  CALCIUM  9.3 9.1 9.7 9.3 9.4  PHOS 5.4* 5.9*  --   --  5.5*   Recent Labs  Lab 11/21/23 0444 11/22/23 0835 11/23/23 0747  WBC  11.0* 12.0* 13.2*  HGB 7.4* 8.6* 8.4*  HCT 23.3* 26.0* 26.2*  MCV 86.0 83.3 83.7  PLT 346 365 376

## 2023-11-23 NOTE — TOC Transition Note (Signed)
 Transition of Care [facility]) - Discharge Note   Patient Details  Name: Morgan [name] MRN: [id number] Date of Birth: [birth year]  [facility] CM/SW Contact:  Robynn Eileen Hoose, RN Phone Number: 11/23/2023, 11:11 AM   Clinical Narrative:   Patient is being discharged today. Spoke with patient and is agreeable to rolling walker as recommended by PT. Rolling walker ordered through Jermaine with [employer] to be delivered to bedside.     Final next level of care: Home/Self Care Barriers to Discharge: No Barriers Identified   Patient Goals and CMS Choice            Discharge Placement                       Discharge Plan and Services Additional resources added to the After Visit Summary for                  DME Arranged: Walker rolling DME Agency: [employer] Date DME Agency Contacted: 11/23/23 Time DME Agency Contacted: [id number] Representative spoke with at DME Agency: [place]            Social Drivers of Health (SDOH) Interventions SDOH Screenings   Food Insecurity: No Food Insecurity (11/20/2023)  Housing: Low Risk  (11/20/2023)  Transportation Needs: No Transportation Needs (11/20/2023)  Utilities: Not At Risk (11/20/2023)  Depression (PHQ2-9): Low Risk  (11/21/2021)  Financial Resource Strain: Low Risk  (09/18/2023)   Received from [facility]  Physical Activity: Insufficiently Active (06/21/2020)   Received from [facility]  Stress: Stress Concern Present (06/21/2020)   Received from [facility]  Tobacco Use: Low Risk  (11/16/2023)     Readmission Risk Interventions    11/18/2023    3:21 PM  Readmission Risk Prevention Plan  Transportation Screening Complete  PCP or Specialist Appt within 5-7 Days Complete  Home Care Screening Complete  Medication Review (RN CM) Referral to Pharmacy

## 2023-11-23 NOTE — Discharge Summary (Signed)
 Physician Discharge Summary   Patient: Morgan [name] MRN: [id number] DOB: 1970-05-12  Admit date:     11/16/2023  Discharge date: 11/23/23  Discharge Physician: Amaryllis Dare   PCP: Orvis Glendia Ditch, MD   Recommendations at discharge:  Please obtain CBC and renal function on follow-up Follow-up with nephrology Follow-up with primary care provider  Discharge Diagnoses: Principal Problem:   Septic shock [facility]) Active Problems:   Chronic kidney disease   Renal transplant recipient   Acute respiratory failure with hypoxia (HCC)   Acute pulmonary edema (HCC)  Resolved Problems:   * No resolved hospital problems. 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  Initially noted to be in septic shock secondary to pneumonia requiring ICU admission now on broad-spectrum antibiotics and requiring pressors.  She was also placed on heated high flow.  9/26: Hemodynamically stable on room air, blood pressures stable on midodrine , switching Solu-Medrol  to p.o. prednisone , needed taper to baseline prednisone  use of 5 mg per nephrology. Can be discharged in 1 to 2 days if renal function remains stable.  9/27: Remained hemodynamically stable.  Stable renal function with creatinine at 5.13 today.  Nephrology cleared her for discharge with a close outpatient follow-up.  Patient was given a tapering course of prednisone , she will take 50 mg twice daily if for next 2 days followed by less 10 mg every 3 days until she completed the course and then she will resume her home dose of prednisone .  Patient will continue with tacrolimus  and CellCept .  Her home antihypertensives and  diuretics were held, patient is being discharged on midodrine  5 mg 3 times daily to maintain her blood pressure.  Her primary care provider or nephrologist can make adjustment and restart antihypertensives as appropriate.  Patient will continue on current medications and need to have a close follow-up with her providers for further assistance.  Assessment & Plan:  Septic shock secondary to left lower lobe pneumonia Acute respiratory failure - Shock physiology has resolved.  Currently on Rocephin  and azithromycin , to complete total 5-day course.  Cultures remain negative to date, now weaned down to room air.  MRSA/RVP, urine strep and Legionella are negative.  Echocardiogram shows preserved EF.  Continue bronchodilators, I-S and flutter valve - Initiated on stress dose steroids with plan to slowly wean down to daily prednisone  5 mg - On midodrine  5 mg 3 times daily, will adjust as needed.   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  Required Lasix , seen by nephrology team here.  Continue bicarb, no need for dialysis.  Continue immunosuppressants -Has a patent right upper extremity AV graft.  Ultrasound renal transplant with Doppler overall unremarkable  Chronic congestive heart failure with preserved EF 55% Coronary artery disease Hyperlipidemia - Continue aspirin , statin, Plavix  - Norvasc  on hold  Essential hypertension - Norvasc  currently on hold.  IV as needed  Diabetes mellitus type 2, A1c 8.6 - Sliding scale and Accu-Cheks.  Adjust long-acting as necessary  Anemia of chronic disease - Continue to monitor hemoglobin closely     Consultants: Nephrology.  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   Latex Itching   Nsaids Other (See Comments)   Hx kidney transplant   Tylenol  [acetaminophen ] Other (See Comments)   Patient states she can not take due to kidney   Shellfish Allergy Other (See Comments)   Hx kidney transplant - told not to consume   Lactose Nausea Only   Misc. Sulfonamide Containing Compounds Itching, Other (See Comments)   Hx kidney transplant        Medication List     PAUSE taking these medications    amLODipine  5 MG tablet Wait to take this until your doctor or other care provider tells you to start again. Commonly known as: NORVASC  Take 5 mg by mouth daily.   Farxiga  10 MG Tabs tablet Wait to take this until your doctor or other care provider tells you to start again. Generic drug: dapagliflozin  propanediol Take 10 mg by mouth daily.   torsemide 20 MG tablet Wait to take this until your doctor or other care provider tells you to start again. Commonly known as: DEMADEX Take 20 mg by mouth daily.       STOP taking these medications    senna-docusate 8.6-50 MG tablet Commonly known as: Senokot-S       TAKE these medications    aspirin  EC 81 MG tablet Take 81 mg by mouth daily. Swallow whole.   clopidogrel  75 MG tablet Commonly known as: PLAVIX  Take 75 mg by mouth daily.   Insulin  Aspart FlexPen 100 UNIT/ML Commonly known as: NOVOLOG  Inject 8-13 Units into the skin 3 (three) times daily with meals. Inject per sliding scale:  70-200    8 units 201-250  9 units 251-300  10  units 301-350  11 units 351-400  12 units 401+       13 units   insulin  degludec 200 UNIT/ML FlexTouch Pen Commonly known as: TRESIBA  Inject 15 Units into the skin 2 (two) times daily with a meal.   levonorgestrel 20 MCG/24HR IUD Commonly known as: MIRENA 1 each by Intrauterine route once.   midodrine  5 MG tablet Commonly known as: PROAMATINE  Take 1 tablet (5 mg total) by mouth 3 (three) times daily with meals.   Mounjaro 7.5 MG/0.5ML Pen Generic drug: tirzepatide Inject 7.5 mg into the skin every Tuesday.   multivitamin Tabs tablet Take 1 tablet by mouth at bedtime.   mycophenolate  250 MG capsule Commonly known as: CELLCEPT  Take 1 capsule (250 mg total) by mouth 2 (two) times daily. What changed:  how much to take when to take this additional instructions   pantoprazole  40 MG tablet Commonly known as: PROTONIX  Take 1 tablet (40 mg total) by mouth daily. Start taking on: November 24, 2023   predniSONE  5 MG tablet Commonly known as: DELTASONE  Take 5 mg by  mouth daily. What changed: Another medication with the same name was added. Make sure you understand how and when to take each.   predniSONE  10 MG tablet Commonly known as: DELTASONE  Please take 50 mg / 5 tablets for next 2 days, decrease 1 tablet every 3 days until you have finished this course and then resume home prednisone  What changed: You were already taking a medication with the same name, and this prescription was added. Make sure you understand how and when to take each.   rosuvastatin  20 MG tablet Commonly known as: CRESTOR  Take 1 tablet (20 mg total) by mouth daily.   sodium bicarbonate  650 MG tablet Take 2 tablets (1,300 mg total) by mouth 3 (three) times daily. What changed: when to take this   tacrolimus  1 MG capsule Commonly known as: PROGRAF  Take 3-4 mg by mouth in the morning and at bedtime. Take 3 capsules by mouth in the morning and 4 capsules  in the evening.        Follow-up  Information     Tobie Gordy POUR, MD Follow up on 12/10/2023.   Specialties: Nephrology, Vascular Surgery Contact information: [address] [place] [address] [phone]         Orvis Glendia Ditch, MD. Schedule an appointment as soon as possible for a visit in 1 week(s).   Specialty: Internal Medicine Contact information: [address] [facility] [place] [address] [phone]                Discharge Exam: Filed Weights   11/18/23 0440 11/19/23 0500 11/20/23 0500  Weight: 87 kg 89.5 kg 86.3 kg   General.  Obese lady, in no acute distress. Pulmonary.  Lungs clear bilaterally, normal respiratory effort. CV.  Regular rate and rhythm, no JVD, rub or murmur. Abdomen.  Soft, nontender, nondistended, BS positive. CNS.  Alert and oriented .  No focal neurologic deficit. Extremities.  No edema, no cyanosis, pulses intact and symmetrical. Psychiatry.  Judgment and insight appears normal.   Condition at discharge: stable  The results of significant diagnostics from this hospitalization (including imaging, microbiology, ancillary and laboratory) are listed below for reference.   Imaging Studies: US  Renal Transplant w/Doppler Result Date: 11/19/2023 CLINICAL DATA:  Acute kidney injury and history of prior renal transplantation. EXAM: ULTRASOUND OF RENAL TRANSPLANT WITH RENAL DOPPLER ULTRASOUND TECHNIQUE: Ultrasound examination of the renal transplant was performed with gray-scale, color and duplex doppler evaluation. COMPARISON:  None Available. FINDINGS: Transplant kidney location: RLQ Transplant Kidney: Renal measurements: 9.6 x 6.8 x 5.4 cm = volume: . Normal in size and parenchymal echogenicity. No evidence of mass or hydronephrosis. No peri-transplant fluid collection seen. Color flow in the main renal artery:  Yes Color flow in the main renal vein:  Yes Duplex Doppler Evaluation: Main Renal Artery Velocity: 53 cm/sec Main Renal Artery Resistive Index: 0.85 Venous  waveform in main renal vein:  Present Intrarenal resistive index in upper pole:  0.87 (normal 0.6-0.8; equivocal 0.8-0.9; abnormal >= 0.9) Intrarenal resistive index in lower pole: 0.77 (normal 0.6-0.8; equivocal 0.8-0.9; abnormal >= 0.9) Bladder: Decompressed by Foley catheter. Other findings:  None. IMPRESSION: 1. No evidence of transplant hydronephrosis or perinephric fluid collection. 2. Equivocal resistive indices in the main renal artery and intrarenal arteries. Electronically Signed   By: Marcey Moan M.D.   On: 11/19/2023 11:13   VAS US  LOWER EXTREMITY VENOUS (DVT) Result Date: 11/18/2023  Lower Venous DVT Study Patient Name:  Morgan [name]  Date of Exam:  11/18/2023 Medical Rec #: [id number]                 Accession #:    [id number] Date of Birth: Feb 24, 1971                 Patient Gender: F Patient Age:   54 years Exam Location:  [facility] Procedure:      VAS US  LOWER EXTREMITY VENOUS (DVT) Referring Phys: MAUDE BANNER --------------------------------------------------------------------------------  Indications: Acute respiratory failure & elevated D-dimer (0.75).  Comparison Study: No previous exams Performing Technologist: Jody Hill RVT, RDMS  Examination Guidelines: A complete evaluation includes B-mode imaging, spectral Doppler, color Doppler, and power Doppler as needed of all accessible portions of each vessel. Bilateral testing is considered an integral part of a complete examination. Limited examinations for reoccurring indications may be performed as noted. The reflux portion of the exam is performed with the patient in reverse Trendelenburg.  +--------+---------------+---------+-----------+----------+--------------------+ RIGHT   CompressibilityPhasicitySpontaneityPropertiesThrombus Aging       +--------+---------------+---------+-----------+----------+--------------------+ CFV     Full           Yes      Yes                                        +--------+---------------+---------+-----------+----------+--------------------+ SFJ     Full                                                              +--------+---------------+---------+-----------+----------+--------------------+ FV Prox Full           Yes      Yes                                       +--------+---------------+---------+-----------+----------+--------------------+ FV Mid  Full           No       Yes        pulsatile                      +--------+---------------+---------+-----------+----------+--------------------+ FV                     Yes      Yes                  patent by            Distal                                               color/doppler        +--------+---------------+---------+-----------+----------+--------------------+ PFV     Full                                                              +--------+---------------+---------+-----------+----------+--------------------+ POP  Full           Yes      Yes                                       +--------+---------------+---------+-----------+----------+--------------------+ PTV     Full                                                              +--------+---------------+---------+-----------+----------+--------------------+ PERO    Full                                                              +--------+---------------+---------+-----------+----------+--------------------+ Patient unable to tolerate compressions of distal fv  +---------+---------------+---------+-----------+----------+--------------+ LEFT     CompressibilityPhasicitySpontaneityPropertiesThrombus Aging +---------+---------------+---------+-----------+----------+--------------+ CFV      Full           No       Yes        pulsatile                +---------+---------------+---------+-----------+----------+--------------+ SFJ      Full                                                         +---------+---------------+---------+-----------+----------+--------------+ FV Prox  Full           No       Yes        pulsatile                +---------+---------------+---------+-----------+----------+--------------+ FV Mid   Full           Yes      Yes                                 +---------+---------------+---------+-----------+----------+--------------+ FV DistalFull           Yes      Yes                                 +---------+---------------+---------+-----------+----------+--------------+ PFV      Full                                                        +---------+---------------+---------+-----------+----------+--------------+ POP      Full           Yes      Yes                                 +---------+---------------+---------+-----------+----------+--------------+ PTV  Full                                                        +---------+---------------+---------+-----------+----------+--------------+ PERO     Full                                                        +---------+---------------+---------+-----------+----------+--------------+     Summary: BILATERAL: - No evidence of deep vein thrombosis seen in the lower extremities, bilaterally. -No evidence of popliteal cyst, bilaterally.   *See table(s) above for measurements and observations. Electronically signed by Debby Robertson on 11/18/2023 at 4:46:58 PM.    Final    ECHOCARDIOGRAM COMPLETE Result Date: 11/18/2023    ECHOCARDIOGRAM REPORT   Patient Name:   Morgan [name] Date of Exam: 11/18/2023 Medical Rec #:  [id number]                Height:       62.0 in Accession #:    [id number]               Weight:       191.8 lb Date of Birth:  1970/04/11                BSA:          1.878 m Patient Age:    53 years                 BP:           110/53 mmHg Patient Gender: F                        HR:           96 bpm. Exam Location:  Inpatient Procedure: 2D  Echo, Cardiac Doppler, Color Doppler and Intracardiac            Opacification Agent (Both Spectral and Color Flow Doppler were            utilized during procedure). Indications:    Shock  History:        Patient has prior history of Echocardiogram examinations, most                 recent 10/10/2023. Signs/Symptoms:Bacteremia; Risk                 Factors:Dyslipidemia, Hypertension and Diabetes.  Sonographer:    Ellouise Mose RDCS Referring Phys: [id number] PETER E BABCOCK  Sonographer Comments: Technically difficult study due to poor echo windows and patient is obese. Image acquisition challenging due to patient body habitus. IMPRESSIONS  1. Left ventricular ejection fraction, by estimation, is 60 to 65%. The left ventricle has normal function. The left ventricle has no regional wall motion abnormalities. Left ventricular diastolic parameters were normal.  2. Right ventricular systolic function is normal. The right ventricular size is normal. Tricuspid regurgitation signal is inadequate for assessing PA pressure.  3. Moderate pleural effusion in the left lateral region.  4. The mitral valve is normal in structure. No evidence of mitral valve regurgitation.  5. The aortic valve is normal in structure. Aortic valve regurgitation is  not visualized.  6. The inferior vena cava is normal in size with greater than 50% respiratory variability, suggesting right atrial pressure of 3 mmHg. FINDINGS  Left Ventricle: Left ventricular ejection fraction, by estimation, is 60 to 65%. The left ventricle has normal function. The left ventricle has no regional wall motion abnormalities. Definity  contrast agent was given IV to delineate the left ventricular  endocardial borders. The left ventricular internal cavity size was normal in size. There is no left ventricular hypertrophy. Left ventricular diastolic parameters were normal. Right Ventricle: The right ventricular size is normal. No increase in right ventricular wall thickness. Right  ventricular systolic function is normal. Tricuspid regurgitation signal is inadequate for assessing PA pressure. Left Atrium: Left atrial size was normal in size. Right Atrium: Right atrial size was normal in size. Pericardium: There is no evidence of pericardial effusion. Mitral Valve: The mitral valve is normal in structure. No evidence of mitral valve regurgitation. MV peak gradient, 9.5 mmHg. The mean mitral valve gradient is 4.0 mmHg. Tricuspid Valve: The tricuspid valve is normal in structure. Tricuspid valve regurgitation is trivial. Aortic Valve: The aortic valve is normal in structure. Aortic valve regurgitation is not visualized. Aortic valve mean gradient measures 4.0 mmHg. Aortic valve peak gradient measures 7.1 mmHg. Aortic valve area, by VTI measures 2.49 cm. Pulmonic Valve: The pulmonic valve was normal in structure. Pulmonic valve regurgitation is not visualized. Aorta: The aortic root and ascending aorta are structurally normal, with no evidence of dilitation. Venous: The inferior vena cava is normal in size with greater than 50% respiratory variability, suggesting right atrial pressure of 3 mmHg. IAS/Shunts: No atrial level shunt detected by color flow Doppler. Additional Comments: There is a moderate pleural effusion in the left lateral region.  LEFT VENTRICLE PLAX 2D LVIDd:         4.50 cm      Diastology LVIDs:         3.40 cm      LV e' medial:    7.18 cm/s LV PW:         1.00 cm      LV E/e' medial:  20.8 LV IVS:        0.90 cm      LV e' lateral:   6.53 cm/s LVOT diam:     2.10 cm      LV E/e' lateral: 22.9 LV SV:         64 LV SV Index:   34 LVOT Area:     3.46 cm  LV Volumes (MOD) LV vol d, MOD A2C: 74.7 ml LV vol d, MOD A4C: 118.9 ml LV vol s, MOD A2C: 45.0 ml LV vol s, MOD A4C: 54.2 ml LV SV MOD A2C:     29.7 ml LV SV MOD A4C:     118.9 ml LV SV MOD BP:      51.0 ml RIGHT VENTRICLE             IVC RV S prime:     10.60 cm/s  IVC diam: 2.00 cm TAPSE (M-mode): 2.2 cm LEFT ATRIUM              Index        RIGHT ATRIUM          Index LA diam:        3.50 cm 1.86 cm/m   RA Area:     7.90 cm LA Vol (A2C):   51.6 ml 27.48 ml/m  RA Volume:  13.70 ml 7.30 ml/m LA Vol (A4C):   39.7 ml 21.14 ml/m LA Biplane Vol: 47.0 ml 25.03 ml/m  AORTIC VALVE AV Area (Vmax):    2.52 cm AV Area (Vmean):   2.51 cm AV Area (VTI):     2.49 cm AV Vmax:           133.00 cm/s AV Vmean:          91.400 cm/s AV VTI:            0.259 m AV Peak Grad:      7.1 mmHg AV Mean Grad:      4.0 mmHg LVOT Vmax:         96.60 cm/s LVOT Vmean:        66.300 cm/s LVOT VTI:          0.186 m LVOT/AV VTI ratio: 0.72  AORTA Ao Root diam: 2.60 cm Ao Asc diam:  2.70 cm MITRAL VALVE MV Area (PHT): 5.60 cm     SHUNTS MV Area VTI:   1.90 cm     Systemic VTI:  0.19 m MV Peak grad:  9.5 mmHg     Systemic Diam: 2.10 cm MV Mean grad:  4.0 mmHg MV Vmax:       1.54 m/s MV Vmean:      101.0 cm/s MV Decel Time: 136 msec MV E velocity: 149.50 cm/s MV A velocity: 115.50 cm/s MV E/A ratio:  1.29 Aditya Sabharwal Electronically signed by Ria Commander Signature Date/Time: 11/18/2023/4:40:37 PM    Final    DG CHEST PORT 1 VIEW Result Date: 11/18/2023 CLINICAL DATA:  200808 Hypoxia [id number] EXAM: PORTABLE CHEST - 1 VIEW COMPARISON:  January 17, 2024 FINDINGS: Hazy airspace opacities in the left lung base with blunting of the left costophrenic sulcus. No pneumothorax. Mild cardiomegaly.No acute fracture or destructive lesion. Twenty is IMPRESSION: Blunting of the left costophrenic sulcus with hazy airspace opacities in the left lung base. This may reflect either bronchopneumonia or atelectasis with a small left pleural effusion. Electronically Signed   By: Rogelia Myers M.D.   On: 11/18/2023 09:23   DG Chest Port 1 View Result Date: 11/17/2023 CLINICAL DATA:  33498 Respiratory failure (HCC) [phone]. EXAM: PORTABLE CHEST 1 VIEW COMPARISON:  11/16/2023. FINDINGS: Low lung volume. Redemonstration of moderate diffuse pulmonary vascular congestion, likely  slightly accentuated by low lung volume. Bilateral lung fields are otherwise clear. No acute consolidation or lung collapse. Bilateral costophrenic angles are clear. Stable cardio-mediastinal silhouette. No acute osseous abnormalities. The soft tissues are within normal limits. Vascular stent noted overlying the left axillary region. IMPRESSION: Redemonstration of moderate diffuse pulmonary vascular congestion, likely slightly accentuated by low lung volume. Electronically Signed   By: Ree Molt M.D.   On: 11/17/2023 11:20   DG Chest Portable 1 View Result Date: 11/16/2023 CLINICAL DATA:  Chest pain and shortness of breath EXAM: PORTABLE CHEST 1 VIEW COMPARISON:  Chest x-ray 10/19/2023 FINDINGS: Heart is enlarged. There central pulmonary vascular congestion. There is no focal lung infiltrate, pleural effusion or pneumothorax. No acute fractures are seen. There are vascular stents in the left adnexa. IMPRESSION: Cardiomegaly with central pulmonary vascular congestion. Electronically Signed   By: Greig Pique M.D.   On: 11/16/2023 20:01    Microbiology: Results for orders placed or performed during the hospital encounter of 11/16/23  Resp panel by RT-PCR (RSV, Flu A&B, Covid) Anterior Nasal Swab     Status: None   Collection Time: 11/16/23  8:16 PM  Specimen: Anterior Nasal Swab  Result Value Ref Range Status   SARS Coronavirus 2 by RT PCR NEGATIVE NEGATIVE Final   Influenza A by PCR NEGATIVE NEGATIVE Final   Influenza B by PCR NEGATIVE NEGATIVE Final    Comment: (NOTE) The Xpert Xpress SARS-CoV-2/FLU/RSV plus assay is intended as an aid in the diagnosis of influenza from Nasopharyngeal swab specimens and should not be used as a sole basis for treatment. Nasal washings and aspirates are unacceptable for Xpert Xpress SARS-CoV-2/FLU/RSV testing.  Fact Sheet for Patients: BloggerCourse.com  Fact Sheet for Healthcare  Providers: SeriousBroker.it  This test is not yet approved or cleared by the [place]  FDA and has been authorized for detection and/or diagnosis of SARS-CoV-2 by FDA under an Emergency Use Authorization (EUA). This EUA will remain in effect (meaning this test can be used) for the duration of the COVID-19 declaration under Section 564(b)(1) of the Act, 21 U.S.C. section 360bbb-3(b)(1), unless the authorization is terminated or revoked.     Resp Syncytial Virus by PCR NEGATIVE NEGATIVE Final    Comment: (NOTE) Fact Sheet for Patients: BloggerCourse.com  Fact Sheet for Healthcare Providers: SeriousBroker.it  This test is not yet approved or cleared by the [place]  FDA and has been authorized for detection and/or diagnosis of SARS-CoV-2 by FDA under an Emergency Use Authorization (EUA). This EUA will remain in effect (meaning this test can be used) for the duration of the COVID-19 declaration under Section 564(b)(1) of the Act, 21 U.S.C. section 360bbb-3(b)(1), unless the authorization is terminated or revoked.  Performed at [facility] [facility], [address] [place], [place] [address]   Blood culture (routine x 2)     Status: None   Collection Time: 11/16/23  8:34 PM   Specimen: BLOOD LEFT HAND  Result Value Ref Range Status   Specimen Description BLOOD LEFT HAND  Final   Special Requests   Final    BOTTLES DRAWN AEROBIC AND ANAEROBIC Blood Culture adequate volume   Culture   Final    NO GROWTH 5 DAYS Performed at [facility], [address], [place], [place] [address]    Report Status 11/21/2023 FINAL  Final  Blood culture (routine x 2)     Status: None   Collection Time: 11/16/23 10:52 PM   Specimen: BLOOD RIGHT HAND  Result Value Ref Range Status   Specimen Description BLOOD RIGHT HAND  Final   Special Requests   Final    BOTTLES DRAWN AEROBIC AND ANAEROBIC Blood Culture  adequate volume   Culture   Final    NO GROWTH 5 DAYS Performed at [facility], [address] [place], [place] [address]    Report Status 11/21/2023 FINAL  Final  MRSA Next Gen by PCR, Nasal     Status: None   Collection Time: 11/17/23 12:25 AM  Result Value Ref Range Status   MRSA by PCR Next Gen NOT DETECTED NOT DETECTED Final    Comment: (NOTE) The GeneXpert MRSA Assay (FDA approved for NASAL specimens only), is one component of a comprehensive MRSA colonization surveillance program. It is not intended to diagnose MRSA infection nor to guide or monitor treatment for MRSA infections. Test performance is not FDA approved in patients less than 65 years old. Performed at [facility], [address], [place], [place] [address]   Respiratory (~20 pathogens) panel by PCR     Status: None   Collection Time: 11/17/23 11:09 AM   Specimen:  Nasopharyngeal Swab; Respiratory  Result Value Ref Range Status   Adenovirus NOT DETECTED NOT DETECTED Final   Coronavirus 229E NOT DETECTED NOT DETECTED Final    Comment: (NOTE) The Coronavirus on the Respiratory Panel, DOES NOT test for the novel  Coronavirus (2019 nCoV)    Coronavirus HKU1 NOT DETECTED NOT DETECTED Final   Coronavirus NL63 NOT DETECTED NOT DETECTED Final   Coronavirus OC43 NOT DETECTED NOT DETECTED Final   Metapneumovirus NOT DETECTED NOT DETECTED Final   Rhinovirus / Enterovirus NOT DETECTED NOT DETECTED Final   Influenza A NOT DETECTED NOT DETECTED Final   Influenza B NOT DETECTED NOT DETECTED Final   Parainfluenza Virus 1 NOT DETECTED NOT DETECTED Final   Parainfluenza Virus 2 NOT DETECTED NOT DETECTED Final   Parainfluenza Virus 3 NOT DETECTED NOT DETECTED Final   Parainfluenza Virus 4 NOT DETECTED NOT DETECTED Final   Respiratory Syncytial Virus NOT DETECTED NOT DETECTED Final   Bordetella pertussis NOT DETECTED NOT DETECTED Final   Bordetella Parapertussis NOT DETECTED NOT DETECTED Final   Chlamydophila  pneumoniae NOT DETECTED NOT DETECTED Final   Mycoplasma pneumoniae NOT DETECTED NOT DETECTED Final    Comment: Performed at [facility], [address], [place], [place] [address]    Labs: CBC: Recent Labs  Lab 11/19/23 [phone] 11/20/23 0440 11/21/23 0444 11/22/23 0835 11/23/23 0747  WBC 16.2* 12.1* 11.0* 12.0* 13.2*  HGB 8.8* 7.8* 7.4* 8.6* 8.4*  HCT 27.2* 23.9* 23.3* 26.0* 26.2*  MCV 84.0 83.0 86.0 83.3 83.7  PLT 464* 368 346 365 376   Basic Metabolic Panel: Recent Labs  Lab 11/17/23 0213 11/18/23 1039 11/19/23 0427 11/20/23 0440 11/21/23 0444 11/22/23 0835 11/23/23 0747 11/23/23 0815  NA 140   < > 138 141 137 135 136 137  K 3.6   < > 3.9 3.4* 3.0* 3.2* 3.2* 3.2*  CL 109   < > 104 105 103 101 101 100  CO2 15*   < > 14* 18* 18* 18* 19* 19*  GLUCOSE 160*   < > 232* 109* 360* 293* 193* 193*  BUN 55*   < > 83* 91* 95* 92* 94* 94*  CREATININE 3.97*   < > 5.72* 5.93* 5.84* 5.47* 5.23* 5.13*  CALCIUM  9.2   < > 9.2 9.3 9.1 9.7 9.3 9.4  MG 2.0  --  2.2 2.2  --  2.0 2.0  --   PHOS 3.1  --  5.4* 5.4* 5.9*  --   --  5.5*   < > = values in this interval not displayed.   Liver Function Tests: Recent Labs  Lab 11/16/23 2252 11/18/23 1039 11/19/23 0427 11/20/23 0440 11/21/23 0444 11/23/23 0815  AST 17 17 22   --   --   --   ALT 17 18 25   --   --   --   ALKPHOS 58 57 61  --   --   --   BILITOT 1.0 1.0 1.1  --   --   --   PROT 6.3* 6.2* 6.7  --   --   --   ALBUMIN  3.1* 2.8* 3.0* 2.9* 2.8* 3.0*   CBG: Recent Labs  Lab 11/21/23 1958 11/22/23 0800 11/22/23 1139 11/22/23 1656 11/22/23 2139  GLUCAP 274* 247* 263* 213* 243*    Discharge time spent: greater than 30 minutes.  This record has been created using Conservation officer, historic buildings. Errors have been sought and corrected,but may not always be located. Such creation errors do  not reflect on the standard of care.   Signed: Amaryllis Dare, MD [facility] 11/23/2023

## 2023-11-23 NOTE — Care Management (Cosign Needed)
    Durable Medical Equipment  (From admission, onward)           Start     Ordered   11/23/23 1110  For home use only DME Walker rolling  Once       Question Answer Comment  Walker: With 5 Inch Wheels   Patient needs a walker to treat with the following condition Weakness      11/23/23 1109           Per PT recommendations

## 2023-11-24 LAB — COPPER, SERUM: Copper: 98 ug/dL (ref 80–158)

## 2023-11-24 LAB — ZINC: Zinc: 54 ug/dL (ref 44–115)

## 2023-11-25 LAB — VITAMIN B1: Vitamin B1 (Thiamine): 87.7 nmol/L (ref 66.5–200.0)

## 2023-11-26 LAB — GLUCOSE, CAPILLARY: Glucose-Capillary: 401 mg/dL — ABNORMAL HIGH (ref 70–99)

## 2023-11-26 LAB — VITAMIN B6: Vitamin B6: 11.3 ug/L (ref 3.4–65.2)

## 2023-11-27 ENCOUNTER — Ambulatory Visit: Admitting: Family Medicine

## 2023-11-27 LAB — VITAMIN C: Vitamin C: 0.6 mg/dL (ref 0.4–2.0)

## 2023-12-11 ENCOUNTER — Emergency Department (HOSPITAL_COMMUNITY)

## 2023-12-11 ENCOUNTER — Inpatient Hospital Stay (HOSPITAL_COMMUNITY)

## 2023-12-11 ENCOUNTER — Inpatient Hospital Stay (HOSPITAL_COMMUNITY)
Admission: EM | Admit: 2023-12-11 | Discharge: 2023-12-14 | DRG: 871 | Disposition: A | Discharge status: Home / Self Care | Attending: Hospitalist | Admitting: Hospitalist

## 2023-12-11 ENCOUNTER — Encounter (HOSPITAL_COMMUNITY): Payer: Self-pay

## 2023-12-11 ENCOUNTER — Other Ambulatory Visit: Payer: Self-pay

## 2023-12-11 DIAGNOSIS — Z992 Dependence on renal dialysis: Secondary | ICD-10-CM

## 2023-12-11 DIAGNOSIS — J9601 Acute respiratory failure with hypoxia: Secondary | ICD-10-CM | POA: Diagnosis present

## 2023-12-11 DIAGNOSIS — Z79624 Long term (current) use of inhibitors of nucleotide synthesis: Secondary | ICD-10-CM

## 2023-12-11 DIAGNOSIS — E669 Obesity, unspecified: Secondary | ICD-10-CM | POA: Diagnosis present

## 2023-12-11 DIAGNOSIS — E11319 Type 2 diabetes mellitus with unspecified diabetic retinopathy without macular edema: Secondary | ICD-10-CM | POA: Diagnosis present

## 2023-12-11 DIAGNOSIS — N2581 Secondary hyperparathyroidism of renal origin: Secondary | ICD-10-CM | POA: Diagnosis present

## 2023-12-11 DIAGNOSIS — E785 Hyperlipidemia, unspecified: Secondary | ICD-10-CM | POA: Diagnosis present

## 2023-12-11 DIAGNOSIS — Z91041 Radiographic dye allergy status: Secondary | ICD-10-CM

## 2023-12-11 DIAGNOSIS — Z794 Long term (current) use of insulin: Secondary | ICD-10-CM

## 2023-12-11 DIAGNOSIS — N184 Chronic kidney disease, stage 4 (severe): Secondary | ICD-10-CM

## 2023-12-11 DIAGNOSIS — R042 Hemoptysis: Secondary | ICD-10-CM | POA: Diagnosis present

## 2023-12-11 DIAGNOSIS — I2510 Atherosclerotic heart disease of native coronary artery without angina pectoris: Secondary | ICD-10-CM | POA: Diagnosis present

## 2023-12-11 DIAGNOSIS — T8612 Kidney transplant failure: Secondary | ICD-10-CM | POA: Diagnosis present

## 2023-12-11 DIAGNOSIS — A419 Sepsis, unspecified organism: Secondary | ICD-10-CM | POA: Diagnosis present

## 2023-12-11 DIAGNOSIS — D84821 Immunodeficiency due to drugs: Secondary | ICD-10-CM | POA: Diagnosis present

## 2023-12-11 DIAGNOSIS — Z7952 Long term (current) use of systemic steroids: Secondary | ICD-10-CM

## 2023-12-11 DIAGNOSIS — R579 Shock, unspecified: Secondary | ICD-10-CM | POA: Diagnosis not present

## 2023-12-11 DIAGNOSIS — Z955 Presence of coronary angioplasty implant and graft: Secondary | ICD-10-CM

## 2023-12-11 DIAGNOSIS — Z91012 Allergy to eggs, unspecified: Secondary | ICD-10-CM

## 2023-12-11 DIAGNOSIS — Y830 Surgical operation with transplant of whole organ as the cause of abnormal reaction of the patient, or of later complication, without mention of misadventure at the time of the procedure: Secondary | ICD-10-CM | POA: Diagnosis present

## 2023-12-11 DIAGNOSIS — I132 Hypertensive heart and chronic kidney disease with heart failure and with stage 5 chronic kidney disease, or end stage renal disease: Secondary | ICD-10-CM | POA: Diagnosis present

## 2023-12-11 DIAGNOSIS — R6521 Severe sepsis with septic shock: Secondary | ICD-10-CM | POA: Diagnosis present

## 2023-12-11 DIAGNOSIS — I9589 Other hypotension: Secondary | ICD-10-CM | POA: Diagnosis present

## 2023-12-11 DIAGNOSIS — Z91013 Allergy to seafood: Secondary | ICD-10-CM

## 2023-12-11 DIAGNOSIS — E1122 Type 2 diabetes mellitus with diabetic chronic kidney disease: Secondary | ICD-10-CM | POA: Diagnosis present

## 2023-12-11 DIAGNOSIS — Z940 Kidney transplant status: Secondary | ICD-10-CM

## 2023-12-11 DIAGNOSIS — Z9049 Acquired absence of other specified parts of digestive tract: Secondary | ICD-10-CM

## 2023-12-11 DIAGNOSIS — Z8673 Personal history of transient ischemic attack (TIA), and cerebral infarction without residual deficits: Secondary | ICD-10-CM

## 2023-12-11 DIAGNOSIS — E1151 Type 2 diabetes mellitus with diabetic peripheral angiopathy without gangrene: Secondary | ICD-10-CM | POA: Diagnosis present

## 2023-12-11 DIAGNOSIS — Z888 Allergy status to other drugs, medicaments and biological substances status: Secondary | ICD-10-CM

## 2023-12-11 DIAGNOSIS — Z7985 Long-term (current) use of injectable non-insulin antidiabetic drugs: Secondary | ICD-10-CM

## 2023-12-11 DIAGNOSIS — E876 Hypokalemia: Secondary | ICD-10-CM

## 2023-12-11 DIAGNOSIS — Z7982 Long term (current) use of aspirin: Secondary | ICD-10-CM

## 2023-12-11 DIAGNOSIS — E119 Type 2 diabetes mellitus without complications: Secondary | ICD-10-CM

## 2023-12-11 DIAGNOSIS — Z975 Presence of (intrauterine) contraceptive device: Secondary | ICD-10-CM

## 2023-12-11 DIAGNOSIS — I5032 Chronic diastolic (congestive) heart failure: Secondary | ICD-10-CM | POA: Diagnosis present

## 2023-12-11 DIAGNOSIS — Z91040 Latex allergy status: Secondary | ICD-10-CM

## 2023-12-11 DIAGNOSIS — Z79899 Other long term (current) drug therapy: Secondary | ICD-10-CM | POA: Diagnosis not present

## 2023-12-11 DIAGNOSIS — Z6833 Body mass index (BMI) 33.0-33.9, adult: Secondary | ICD-10-CM

## 2023-12-11 DIAGNOSIS — Z7902 Long term (current) use of antithrombotics/antiplatelets: Secondary | ICD-10-CM

## 2023-12-11 DIAGNOSIS — J189 Pneumonia, unspecified organism: Secondary | ICD-10-CM | POA: Diagnosis present

## 2023-12-11 DIAGNOSIS — D631 Anemia in chronic kidney disease: Secondary | ICD-10-CM

## 2023-12-11 DIAGNOSIS — N186 End stage renal disease: Secondary | ICD-10-CM | POA: Diagnosis present

## 2023-12-11 DIAGNOSIS — Z9151 Personal history of suicidal behavior: Secondary | ICD-10-CM

## 2023-12-11 DIAGNOSIS — Z833 Family history of diabetes mellitus: Secondary | ICD-10-CM

## 2023-12-11 DIAGNOSIS — Z886 Allergy status to analgesic agent status: Secondary | ICD-10-CM

## 2023-12-11 LAB — CBC WITH DIFFERENTIAL/PLATELET
Abs Immature Granulocytes: 0.03 K/uL (ref 0.00–0.07)
Basophils Absolute: 0 K/uL (ref 0.0–0.1)
Basophils Relative: 0 %
Eosinophils Absolute: 0 K/uL (ref 0.0–0.5)
Eosinophils Relative: 0 %
HCT: 32.9 % — ABNORMAL LOW (ref 36.0–46.0)
Hemoglobin: 10 g/dL — ABNORMAL LOW (ref 12.0–15.0)
Immature Granulocytes: 0 %
Lymphocytes Relative: 10 %
Lymphs Abs: 0.9 K/uL (ref 0.7–4.0)
MCH: 27.5 pg (ref 26.0–34.0)
MCHC: 30.4 g/dL (ref 30.0–36.0)
MCV: 90.6 fL (ref 80.0–100.0)
Monocytes Absolute: 0.4 K/uL (ref 0.1–1.0)
Monocytes Relative: 5 %
Neutro Abs: 7.3 K/uL (ref 1.7–7.7)
Neutrophils Relative %: 85 %
Platelets: 282 K/uL (ref 150–400)
RBC: 3.63 MIL/uL — ABNORMAL LOW (ref 3.87–5.11)
RDW: 15.5 % (ref 11.5–15.5)
WBC: 8.6 K/uL (ref 4.0–10.5)
nRBC: 0 % (ref 0.0–0.2)

## 2023-12-11 LAB — I-STAT CHEM 8, ED
BUN: 25 mg/dL — ABNORMAL HIGH (ref 6–20)
Calcium, Ion: 1.06 mmol/L — ABNORMAL LOW (ref 1.15–1.40)
Chloride: 101 mmol/L (ref 98–111)
Creatinine, Ser: 2.7 mg/dL — ABNORMAL HIGH (ref 0.44–1.00)
Glucose, Bld: 102 mg/dL — ABNORMAL HIGH (ref 70–99)
HCT: 31 % — ABNORMAL LOW (ref 36.0–46.0)
Hemoglobin: 10.5 g/dL — ABNORMAL LOW (ref 12.0–15.0)
Potassium: 3.2 mmol/L — ABNORMAL LOW (ref 3.5–5.1)
Sodium: 144 mmol/L (ref 135–145)
TCO2: 28 mmol/L (ref 22–32)

## 2023-12-11 LAB — BASIC METABOLIC PANEL WITH GFR
Anion gap: 14 (ref 5–15)
BUN: 25 mg/dL — ABNORMAL HIGH (ref 6–20)
CO2: 27 mmol/L (ref 22–32)
Calcium: 8.2 mg/dL — ABNORMAL LOW (ref 8.9–10.3)
Chloride: 102 mmol/L (ref 98–111)
Creatinine, Ser: 2.7 mg/dL — ABNORMAL HIGH (ref 0.44–1.00)
GFR, Estimated: 20 mL/min — ABNORMAL LOW (ref >60)
Glucose, Bld: 104 mg/dL — ABNORMAL HIGH (ref 70–99)
Potassium: 3.2 mmol/L — ABNORMAL LOW (ref 3.5–5.1)
Sodium: 143 mmol/L (ref 135–145)

## 2023-12-11 LAB — HEPATITIS PANEL, ACUTE
HCV Ab: NONREACTIVE
Hep A IgM: NONREACTIVE
Hep B C IgM: NONREACTIVE
Hepatitis B Surface Ag: NONREACTIVE

## 2023-12-11 LAB — RESPIRATORY PANEL BY PCR

## 2023-12-11 LAB — HEPATIC FUNCTION PANEL
ALT: 90 U/L — ABNORMAL HIGH (ref 0–44)
AST: 54 U/L — ABNORMAL HIGH (ref 15–41)
Albumin: 3 g/dL — ABNORMAL LOW (ref 3.5–5.0)
Alkaline Phosphatase: 245 U/L — ABNORMAL HIGH (ref 38–126)
Bilirubin, Direct: 0.2 mg/dL (ref 0.0–0.2)
Indirect Bilirubin: 1.1 mg/dL — ABNORMAL HIGH (ref 0.3–0.9)
Total Bilirubin: 1.3 mg/dL — ABNORMAL HIGH (ref 0.0–1.2)
Total Protein: 6.2 g/dL — ABNORMAL LOW (ref 6.5–8.1)

## 2023-12-11 LAB — HEPATITIS B SURFACE ANTIGEN: Hepatitis B Surface Ag: NONREACTIVE

## 2023-12-11 LAB — BRAIN NATRIURETIC PEPTIDE: B Natriuretic Peptide: 739.5 pg/mL — ABNORMAL HIGH (ref 0.0–100.0)

## 2023-12-11 LAB — LACTIC ACID, PLASMA: Lactic Acid, Venous: 0.9 mmol/L (ref 0.5–1.9)

## 2023-12-11 LAB — TROPONIN I (HIGH SENSITIVITY)
Troponin I (High Sensitivity): 105 ng/L (ref <18)
Troponin I (High Sensitivity): 342 ng/L (ref <18)

## 2023-12-11 LAB — PROTIME-INR
INR: 1.1 (ref 0.8–1.2)
Prothrombin Time: 14.5 s (ref 11.4–15.2)

## 2023-12-11 LAB — MRSA NEXT GEN BY PCR, NASAL: MRSA by PCR Next Gen: NOT DETECTED

## 2023-12-11 LAB — APTT: aPTT: 27 s (ref 24–36)

## 2023-12-11 MED ORDER — NOREPINEPHRINE 16 MG/250ML-% IV SOLN
0.0000 ug/min | INTRAVENOUS | Status: DC
  Administered 2023-12-11: 2 ug/min via INTRAVENOUS
  Filled 2023-12-11: qty 250

## 2023-12-11 MED ORDER — ASPIRIN 81 MG PO TBEC
81.0000 mg | DELAYED_RELEASE_TABLET | Freq: Every day | ORAL | Status: DC
  Administered 2023-12-12 – 2023-12-14 (×3): 81 mg via ORAL
  Filled 2023-12-11 (×3): qty 1

## 2023-12-11 MED ORDER — INSULIN ASPART 100 UNIT/ML IJ SOLN
0.0000 [IU] | INTRAMUSCULAR | Status: DC
  Administered 2023-12-12 (×3): 1 [IU] via SUBCUTANEOUS
  Administered 2023-12-13: 4 [IU] via SUBCUTANEOUS
  Administered 2023-12-13: 1 [IU] via SUBCUTANEOUS
  Administered 2023-12-13: 2 [IU] via SUBCUTANEOUS
  Administered 2023-12-13: 1 [IU] via SUBCUTANEOUS
  Administered 2023-12-14: 4 [IU] via SUBCUTANEOUS
  Administered 2023-12-14 (×2): 2 [IU] via SUBCUTANEOUS

## 2023-12-11 MED ORDER — TACROLIMUS 1 MG PO CAPS
4.0000 mg | ORAL_CAPSULE | Freq: Every day | ORAL | Status: DC
Start: 2023-12-11 — End: 2023-12-11

## 2023-12-11 MED ORDER — DOCUSATE SODIUM 100 MG PO CAPS: 100.0000 mg | ORAL_CAPSULE | Freq: Two times a day (BID) | ORAL | Status: DC | PRN

## 2023-12-11 MED ORDER — PANTOPRAZOLE SODIUM 40 MG PO TBEC
40.0000 mg | DELAYED_RELEASE_TABLET | Freq: Every day | ORAL | Status: DC
Start: 2023-12-11 — End: 2023-12-11

## 2023-12-11 MED ORDER — ONDANSETRON HCL 4 MG/2ML IJ SOLN
4.0000 mg | Freq: Four times a day (QID) | INTRAMUSCULAR | Status: DC | PRN
  Administered 2023-12-12: 4 mg via INTRAVENOUS
  Filled 2023-12-11: qty 2

## 2023-12-11 MED ORDER — TACROLIMUS 1 MG PO CAPS: 3.0000 mg | ORAL_CAPSULE | Freq: Every day | ORAL | Status: DC

## 2023-12-11 MED ORDER — TACROLIMUS 1 MG PO CAPS
2.0000 mg | ORAL_CAPSULE | Freq: Two times a day (BID) | ORAL | Status: DC
  Administered 2023-12-12 – 2023-12-14 (×4): 2 mg via ORAL
  Filled 2023-12-11 (×7): qty 2

## 2023-12-11 MED ORDER — ALBUTEROL SULFATE (2.5 MG/3ML) 0.083% IN NEBU
INHALATION_SOLUTION | RESPIRATORY_TRACT | Status: AC
  Filled 2023-12-11: qty 12

## 2023-12-11 MED ORDER — FUROSEMIDE 10 MG/ML IJ SOLN
60.0000 mg | Freq: Once | INTRAMUSCULAR | Status: AC
  Administered 2023-12-11: 60 mg via INTRAVENOUS
  Filled 2023-12-11: qty 6

## 2023-12-11 MED ORDER — PANTOPRAZOLE SODIUM 40 MG IV SOLR
40.0000 mg | Freq: Every day | INTRAVENOUS | Status: DC
  Administered 2023-12-11: 40 mg via INTRAVENOUS
  Filled 2023-12-11 (×2): qty 10

## 2023-12-11 MED ORDER — IPRATROPIUM BROMIDE 0.02 % IN SOLN
1.0000 mg | Freq: Once | RESPIRATORY_TRACT | Status: AC
  Administered 2023-12-11: 1 mg via RESPIRATORY_TRACT
  Filled 2023-12-11: qty 5

## 2023-12-11 MED ORDER — POTASSIUM CHLORIDE 10 MEQ/50ML IV SOLN: 10.0000 meq | INTRAVENOUS | Status: DC

## 2023-12-11 MED ORDER — POTASSIUM CHLORIDE 10 MEQ/100ML IV SOLN
10.0000 meq | INTRAVENOUS | Status: AC
  Administered 2023-12-11 (×4): 10 meq via INTRAVENOUS
  Filled 2023-12-11 (×4): qty 100

## 2023-12-11 MED ORDER — ALBUTEROL SULFATE (2.5 MG/3ML) 0.083% IN NEBU
10.0000 mg | INHALATION_SOLUTION | Freq: Once | RESPIRATORY_TRACT | Status: AC
  Administered 2023-12-11: 10 mg via RESPIRATORY_TRACT

## 2023-12-11 MED ORDER — MIDODRINE HCL 5 MG PO TABS
5.0000 mg | ORAL_TABLET | Freq: Three times a day (TID) | ORAL | Status: DC
  Administered 2023-12-12 – 2023-12-14 (×5): 5 mg via ORAL
  Filled 2023-12-11 (×6): qty 1

## 2023-12-11 MED ORDER — ONDANSETRON HCL 4 MG/2ML IJ SOLN
4.0000 mg | Freq: Once | INTRAMUSCULAR | Status: AC
  Administered 2023-12-11: 4 mg via INTRAVENOUS
  Filled 2023-12-11: qty 2

## 2023-12-11 MED ORDER — TRANEXAMIC ACID FOR INHALATION
500.0000 mg | Freq: Once | RESPIRATORY_TRACT | Status: AC
  Administered 2023-12-11: 500 mg via RESPIRATORY_TRACT
  Filled 2023-12-11: qty 10

## 2023-12-11 MED ORDER — NOREPINEPHRINE 4 MG/250ML-% IV SOLN
INTRAVENOUS | Status: DC
Start: 2023-12-11 — End: 2023-12-11
  Filled 2023-12-11: qty 250

## 2023-12-11 MED ORDER — PREDNISONE 5 MG PO TABS
5.0000 mg | ORAL_TABLET | Freq: Every day | ORAL | Status: DC
Start: 2023-12-11 — End: 2023-12-12
  Filled 2023-12-11 (×2): qty 1

## 2023-12-11 MED ORDER — SODIUM CHLORIDE 0.9 % IV BOLUS
500.0000 mL | Freq: Once | INTRAVENOUS | Status: AC
Start: 2023-12-11 — End: 2023-12-11
  Administered 2023-12-11: 500 mL via INTRAVENOUS

## 2023-12-11 MED ORDER — HEPARIN SODIUM (PORCINE) 5000 UNIT/ML IJ SOLN
5000.0000 [IU] | Freq: Three times a day (TID) | INTRAMUSCULAR | Status: DC
  Administered 2023-12-12 – 2023-12-14 (×6): 5000 [IU] via SUBCUTANEOUS
  Filled 2023-12-11 (×8): qty 1

## 2023-12-11 MED ORDER — CHLORHEXIDINE GLUCONATE CLOTH 2 % EX PADS
6.0000 | MEDICATED_PAD | Freq: Every day | CUTANEOUS | Status: DC
  Administered 2023-12-12: 6 via TOPICAL

## 2023-12-11 MED ORDER — CHLORHEXIDINE GLUCONATE CLOTH 2 % EX PADS
6.0000 | MEDICATED_PAD | Freq: Every day | CUTANEOUS | Status: DC
  Administered 2023-12-11 – 2023-12-12 (×2): 6 via TOPICAL

## 2023-12-11 MED ORDER — POLYETHYLENE GLYCOL 3350 17 G PO PACK: 17.0000 g | PACK | Freq: Every day | ORAL | Status: DC | PRN

## 2023-12-11 MED ORDER — FUROSEMIDE 10 MG/ML IJ SOLN
120.0000 mg | Freq: Once | INTRAVENOUS | Status: DC
  Filled 2023-12-11: qty 12

## 2023-12-11 NOTE — Progress Notes (Signed)
 IV Team received a consult to de-access the pt's RUA graft/fistula;  pt also noted to be on blood thinners;  minimal bleeding noted from venous port; significant bleeding from arterial port; pressure held for 20-25 minutes; bleeding has stopped; gauze pressure drsgs clean and intact;  RN to monitor RUA in case bleeding starts again.

## 2023-12-11 NOTE — ED Triage Notes (Signed)
 Pt was BIB [facility] from dialysis for complaints of SHOB, Hypotension, coughing frothy bloody sputum. She had been in dialysis for 2 hours when the hypotension and SHOB started. 15L non rebreather in route

## 2023-12-11 NOTE — ED Provider Notes (Signed)
 [place] EMERGENCY DEPARTMENT AT [facility] Provider Note   CSN: [id number] Arrival date & time: 12/11/23  1527     Patient presents with: No chief complaint on file.   Morgan [name] is a 53 y.o. female.   54 year old presented from dialysis in severe respiratory distress, hypotension and with hemoptysis.  Patient with shortness of breath and cough for the past few days.  Recent hospitalization for pneumonia.  Feels somewhat improved with oxygen, but still complaining of shortness of breath.  No fevers or chills.  No chest pain.        Prior to Admission medications   Medication Sig Start Date End Date Taking? Authorizing Provider  amLODipine  (NORVASC ) 5 MG tablet Take 5 mg by mouth daily. 01/03/18   [provider]  aspirin  EC 81 MG tablet Take 81 mg by mouth daily. Swallow whole.    [provider]  clopidogrel  (PLAVIX ) 75 MG tablet Take 75 mg by mouth daily.    [provider]  FARXIGA  10 MG TABS tablet Take 10 mg by mouth daily. 09/08/23   [provider]  Insulin  Aspart FlexPen (NOVOLOG ) 100 UNIT/ML Inject 8-13 Units into the skin 3 (three) times daily with meals. Inject per sliding scale:  70-200    8 units 201-250  9 units 251-300  10 units 301-350  11 units 351-400  12 units 401+       13 units 10/23/21   [provider]  insulin  degludec (TRESIBA ) 200 UNIT/ML FlexTouch Pen Inject 15 Units into the skin 2 (two) times daily with a meal.    [provider]  levonorgestrel (MIRENA) 20 MCG/24HR IUD 1 each by Intrauterine route once.    [provider]  midodrine  (PROAMATINE ) 5 MG tablet Take 1 tablet (5 mg total) by mouth 3 (three) times daily with meals. 11/23/23   Amin, Sumayya, MD  MOUNJARO 7.5 MG/0.5ML Pen Inject 7.5 mg into the skin every Tuesday. 05/13/23   [provider]  multivitamin (RENA-VIT) TABS tablet Take 1 tablet by mouth at bedtime. 11/23/23   Caleen Qualia, MD   mycophenolate  (CELLCEPT ) 250 MG capsule Take 1 capsule (250 mg total) by mouth 2 (two) times daily. 11/23/23   Caleen Qualia, MD  pantoprazole  (PROTONIX ) 40 MG tablet Take 1 tablet (40 mg total) by mouth daily. 11/24/23   Amin, Sumayya, MD  predniSONE  (DELTASONE ) 10 MG tablet Please take 50 mg / 5 tablets for next 2 days, decrease 1 tablet every 3 days until you have finished this course and then resume home prednisone  11/23/23   Amin, Sumayya, MD  predniSONE  (DELTASONE ) 5 MG tablet Take 5 mg by mouth daily.    [provider]  rosuvastatin  (CRESTOR ) 20 MG tablet Take 1 tablet (20 mg total) by mouth daily. 09/11/21 02/26/24  Elgergawy, Brayton RAMAN, MD  sodium bicarbonate  650 MG tablet Take 2 tablets (1,300 mg total) by mouth 3 (three) times daily. 11/23/23   Caleen Qualia, MD  tacrolimus  (PROGRAF ) 1 MG capsule Take 3-4 mg by mouth in the morning and at bedtime. Take 3 capsules by mouth in the morning and 4 capsules  in the evening.    [provider]  torsemide (DEMADEX) 20 MG tablet Take 20 mg by mouth daily. 10/22/23   [provider]    Allergies: Fluorescite [fluorescein]; Iodinated contrast media; Iohexol ; Egg solids, whole; Latex; Nsaids; Tylenol  [acetaminophen ]; Shellfish allergy; Lactose; and Misc. sulfonamide containing compounds    Review of Systems  Updated Vital Signs BP (!) 115/95   Pulse (!) 117   Temp (!) 97 F (36.1 C) (Temporal)   Resp (!) 27   Ht 5' 2 (1.575 m)   Wt 83.6 kg   SpO2 100%   BMI 33.71 kg/m   Physical Exam Vitals and nursing note reviewed.  Constitutional:      General: She is not in acute distress.    Appearance: She is not toxic-appearing.  HENT:     Head: Normocephalic.     Nose: Nose normal.     Mouth/Throat:     Mouth: Mucous membranes are moist.  Eyes:     Conjunctiva/sclera: Conjunctivae normal.  Cardiovascular:     Rate and Rhythm: Normal rate and regular rhythm.  Pulmonary:     Effort: Respiratory distress present.      Breath sounds: Rhonchi and rales present.  Abdominal:     General: Abdomen is flat. There is no distension.     Tenderness: There is no abdominal tenderness. There is no guarding or rebound.  Musculoskeletal:     Cervical back: Normal range of motion.  Skin:    General: Skin is warm.     Capillary Refill: Capillary refill takes less than 2 seconds.  Neurological:     Mental Status: She is alert and oriented to person, place, and time.  Psychiatric:        Mood and Affect: Mood normal.        Behavior: Behavior normal.     (all labs ordered are listed, but only abnormal results are displayed) Labs Reviewed  CBC WITH DIFFERENTIAL/PLATELET - Abnormal; Notable for the following components:      Result Value   RBC 3.63 (*)    Hemoglobin 10.0 (*)    HCT 32.9 (*)    All other components within normal limits  BASIC METABOLIC PANEL WITH GFR - Abnormal; Notable for the following components:   Potassium 3.2 (*)    Glucose, Bld 104 (*)    BUN 25 (*)    Creatinine, Ser 2.70 (*)    Calcium  8.2 (*)    GFR, Estimated 20 (*)    All other components within normal limits  HEPATIC FUNCTION PANEL - Abnormal; Notable for the following components:   Total Protein 6.2 (*)    Albumin  3.0 (*)    AST 54 (*)    ALT 90 (*)    Alkaline Phosphatase 245 (*)    Total Bilirubin 1.3 (*)    Indirect Bilirubin 1.1 (*)    All other components within normal limits  BRAIN NATRIURETIC PEPTIDE - Abnormal; Notable for the following components:   B Natriuretic Peptide 739.5 (*)    All other components within normal limits  I-STAT CHEM 8, ED - Abnormal; Notable for the following components:   Potassium 3.2 (*)    BUN 25 (*)    Creatinine, Ser 2.70 (*)    Glucose, Bld 102 (*)    Calcium , Ion 1.06 (*)    Hemoglobin 10.5 (*)    HCT 31.0 (*)    All other components within normal limits  TROPONIN I (HIGH SENSITIVITY) - Abnormal; Notable for the following components:   Troponin I (High Sensitivity) 105 (*)     All other components within normal limits  PROTIME-INR  APTT  TROPONIN I (HIGH SENSITIVITY)    EKG: EKG Interpretation Date/Time:  Wednesday December 11 2023 16:37:20 EDT Ventricular Rate:  123 PR Interval:  128 QRS Duration:  87 QT  Interval:  315 QTC Calculation: 451 R Axis:   -15  Text Interpretation: Sinus tachycardia Borderline left axis deviation Abnormal T, consider ischemia, lateral leads Confirmed by Neysa Clap [phone]) on 12/11/2023 5:54:39 PM  Radiology: [facility] Chest Portable 1 View Result Date: 12/11/2023 CLINICAL DATA:  Shortness of breath. EXAM: PORTABLE CHEST 1 VIEW COMPARISON:  November 18, 2023 FINDINGS: The heart size and mediastinal contours are within normal limits. Moderate to marked severity infiltrates are seen throughout both lungs, right greater than left, with mildly aerated lung noted within the left upper lobe. No pleural effusion or pneumothorax is identified. The visualized skeletal structures are unremarkable. IMPRESSION: Moderate to marked severity bilateral infiltrates, right greater than left. Sequelae associated with severe pulmonary edema cannot be excluded. Electronically Signed   By: Suzen Dials M.D.   On: 12/11/2023 16:47     .Critical Care  Performed by: Neysa Clap PARAS, DO Authorized by: Neysa Clap PARAS, DO   Critical care provider statement:    Critical care time (minutes):  60   Critical care was necessary to treat or prevent imminent or life-threatening deterioration of the following conditions:  Respiratory failure and circulatory failure   Critical care was time spent personally by me on the following activities:  Development of treatment plan with patient or surrogate, discussions with consultants, evaluation of patient's response to treatment, examination of patient, ordering and review of laboratory studies, ordering and review of radiographic studies, ordering and performing treatments and interventions, pulse oximetry,  re-evaluation of patient's condition and review of old charts   Care discussed with: admitting provider      Medications Ordered in the ED  albuterol  (PROVENTIL ) (2.5 MG/3ML) 0.083% nebulizer solution (0 mg  Hold 12/11/23 1721)  sodium chloride  0.9 % bolus 500 mL (0 mLs Intravenous Stopped 12/11/23 1644)  ondansetron  (ZOFRAN ) injection 4 mg (4 mg Intravenous Given 12/11/23 1623)  tranexamic acid (CYKLOKAPRON) 1000 MG/10ML nebulizer solution 500 mg (500 mg Nebulization Given 12/11/23 1554)  albuterol  (PROVENTIL ) (2.5 MG/3ML) 0.083% nebulizer solution 10 mg (10 mg Nebulization Given 12/11/23 1626)  ipratropium (ATROVENT) nebulizer solution 1 mg (1 mg Nebulization Given 12/11/23 1623)  furosemide  (LASIX ) injection 60 mg (60 mg Intravenous Given 12/11/23 1716)    Clinical Course as of 12/11/23 1755  Wed Dec 11, 2023  1621 Patient's severe respiratory distress.  Small-volume frank hemoptysis noted.  Difficult time obtaining access.  Nebulized TXA given which has seemingly improved patient's hemoptysis however continues to have increased work of breathing.  It does appear that she was treated for pneumonia last month and had some response to bronchodilators.  Will trial breathing treatment, however her symptoms may be secondary to pulmonary edema which would need more positive pressure then high flow nasal cannula. [TY]  1646 Patient small amounts of hemoptysis seems to have improved.  Is still coughing.  Chest x-ray appears to be pulmonary edema, does have diffuse B-lines on bedside ultrasound.  Will trial BiPAP at this time [TY]  1655 From chart review of recent hospitalization: eptic shock secondary to left lower lobe pneumonia Acute respiratory failure - Shock physiology has resolved.  Currently on Rocephin  and azithromycin , to complete total 5-day course.  Cultures remain negative to date, now weaned down to room air.  MRSA/RVP, urine strep and Legionella are negative.  Echocardiogram shows  preserved EF.  Continue bronchodilators, I-S and flutter valve - Initiated on stress dose steroids with plan to slowly wean down to daily prednisone  5 mg - On midodrine  5 mg  3 times daily, will adjust as needed.    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  Required Lasix , seen by nephrology team here.  Continue bicarb, no need for dialysis.  Continue immunosuppressants -Has a patent right upper extremity AV graft.  Ultrasound renal transplant with Doppler overall unremarkable   Chronic congestive heart failure with preserved EF 55% Coronary artery disease Hyperlipidemia - Continue aspirin , statin, Plavix  - Norvasc  on hold   Essential hypertension - Norvasc  currently on hold.  IV as needed   Diabetes mellitus type 2, A1c 8.6 - Sliding scale and Accu-Cheks.  Adjust long-acting as necessary   Anemia of chronic disease - Continue to monitor hemoglobin closely  [TY]  1707 Patient appears more comfortable on BiPAP.  Work of breathing improved.  Saturating well. [TY]  1707 CBC with Differential(!) Hemoglobin stable compared to prior. [TY]  1708 DG Chest Portable 1 View IMPRESSION: Moderate to marked severity bilateral infiltrates, right greater than left. Sequelae associated with severe pulmonary edema cannot be excluded.   Electronically Signed   By: Suzen Dials M.D.   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  Per chart review recently hospitalized for multifocal pneumonia.  EMS reported blood  pressure in the 70s systolic and only receiving 2 hours of dialysis treatment.  This is patient's first dialysis treatment, reportedly failed kidney transplant.  Husband notes that she has had more shortness of breath for the past few days as well as a cough.  Hemoptysis today.  Frequent coughing with small amount of blood-tinged sputum.  Nebulized TXA with improvement.  Patient with significant respiratory distress was on high flow nasal cannula, trialed breathing treatment as she had multifocal pneumonia and was on bronchodilator/had some response to treatment then.  However chest x-ray does appear to be more pulmonary edema, bedside ultrasound diffuse B-lines.  Given that symptoms started while at dialysis pulmonary edema fits more of the clinical picture.  Her hemoptysis improved to the point where I felt comfortable placing her on BiPAP.  Work of breathing continues to improve.  Lasix  ordered as she reports that she does still make urine.  Her hemoglobin is stable.  Her renal function appears elevated, but down from several weeks ago.  Mildly low potassium.  Pulm/ICU consulted for admission given her numerous comorbidities and her hemoptysis. [TY]  1741 Case discussed with ICU will come evaluate patient for possible admission to the ICU service. [TY]    Clinical Course User Index [TY] Neysa Caron PARAS, DO                                 Medical Decision Making See ED course for MDM and disposition.  Amount and/or Complexity of Data Reviewed Independent Historian: EMS    Details: Reported initial blood pressure in the 70s systolic External Data Reviewed:     Details: Does not appear to be taking a blood thinner. Labs: ordered. Decision-making details documented in ED Course. Radiology: ordered and independent interpretation performed. Decision-making  details documented in ED Course.    Details: Chest x-ray with diffuse infiltrate, likely pulmonary edema.  Considered PE, however clinically  patient likely with pulmonary edema.  However is tachycardic.  She is anaphylactic to contrast.  Therefore will likely need V/Q inpatient.   ECG/medicine tests: ordered.    Details: Sinus tachycardia.  Risk Prescription drug management. Decision regarding hospitalization. Diagnosis or treatment significantly limited by social determinants of health. Risk Details: Poor health literacy       Final diagnoses:  None    ED Discharge Orders     None          Neysa Caron PARAS, DO 12/11/23 1755

## 2023-12-11 NOTE — H&P (Signed)
 NAME:  Morgan [name], MRN:  [id number], DOB:  1970/06/09, LOS: 0 ADMISSION DATE:  12/11/2023, CONSULTATION DATE:  12/11/23 REFERRING MD:  EDP, CHIEF COMPLAINT:   shortness of breaht  History of Present Illness:  Morgan [name] is a 53 y.o. 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  EMS was called and the patient required 15L non-breather en route.  In the ED, she was tachycardic and tachypneic and coughing up pink sputum and was placed on bipap.  CXR with R>L severe bilateral infiltrates vs pulmonary edema.  Labs significant for a troponin of 105,  K 3.2, creatinine 2.7 (baseline ~5.0), AST 54, ALT 90, BNP 739.   She was given Lasix  60mg  and PCCM consulted for admission  Pertinent  Medical History   has a past medical history of Acute respiratory failure with hypoxia (HCC) (10/09/2023), Anemia, CKD (chronic kidney disease) stage V requiring chronic dialysis (HCC) (05/08/2012), Constipation, Coronary artery disease, CVA (cerebral vascular accident) (HCC) (05/12/2021), Diabetes mellitus without complication (HCC), Diabetic retinopathy (HCC), ESRD on hemodialysis (HCC) (06/05/2014), H/O detached retina repair, History of CVA (cerebrovascular accident), Hyperlipidemia, Hypertension, Insulin  overdose (05/08/2012), Kidney transplanted, Pneumonia, PVD (peripheral vascular disease), S/P drug eluting coronary stent placement (08/24/2021), and Suicide attempt (HCC) (05/08/2012).   Significant Hospital Events: Including procedures, antibiotic start and stop dates in addition to other pertinent events   10/15 admit with pulmonary edema on bipap   Interim History / Subjective:  As above   Objective    Blood pressure (!)  115/95, pulse (!) 117, temperature (!) 97 F (36.1 C), temperature source Temporal, resp. rate (!) 27, height 5' 2 (1.575 m), weight 83.6 kg, SpO2 100%.    Vent Mode: PSV;CPAP FiO2 (%):  [60 %-70 %] 60 % PEEP:  [8 cmH20] 8 cmH20 Pressure Support:  [8 cmH20] 8 cmH20  No intake or output data in the 24 hours ending 12/11/23 1747 Filed Weights   12/11/23 1553  Weight: 83.6 kg    General: obese, chronically ill appearing female  HEENT: MM pink/moist, sclera anicteric Neuro: interactive, nods appropriately, non-focal CV: regular rate and rhythm, normal s1s2, no m/r/g PULM:  breathing comfortably on bipap, rhonchi throughout GI: soft, non-tender, non-distended Extremities: warm/dry, 2-3 pitting edema from feet to flank Skin: no rashes or lesions  Labs and imaging in chart reviewed  Resolved problem list   Assessment and Plan   Acute Hypoxic Respiratory Failure secondary to acute pulmonary edema  History of HFpEF HTN, CAD, HL Last echo 10/2023 60-65%, normal RV. Reported hemoptysis likely due to pulmonary edema. Grossly volume overloaded on exam with pitting edema to flank.  -admit to ICU for close monitoring, continue Bipap  -does make some urine so will give 120 mg IV Lasix  and discuss dialysis tonight with nephrology -ABG, lactic acid  -continue ASA and statin. 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  -nephrology consult  -monitor electrolytes -IV diuresis per above and plan for iHD tonight  -continue home prograf   and prednisone . Will hold MMF per nephrology   Troponemia Has elevated troponin (~80-300) at baseline in the setting of ESRD. ECG (personally read) sinus  tachycardia, non-specific T wave changes. No complaint of chest pain.  -low suspicion for ACS  Hypotension -continue home midodrine , can increase if needed  Type 2 DM -continue CBG monitoring, SSI  Anemia of Chronic Disease  Hgb on admission 10.5. No active signs of bleeding. -continue to monitor CBC  VTE ppx: SQH Nutrition: NPO given respiratory failure  Labs   CBC: Recent Labs  Lab 12/11/23 1641 12/11/23 1646  WBC 8.6  --   NEUTROABS 7.3  --   HGB 10.0* 10.5*  HCT 32.9* 31.0*  MCV 90.6  --   PLT 282  --     Basic Metabolic Panel: Recent Labs  Lab 12/11/23 1641 12/11/23 1646  NA 143 144  K 3.2* 3.2*  CL 102 101  CO2 27  --   GLUCOSE 104* 102*  BUN 25* 25*  CREATININE 2.70* 2.70*  CALCIUM  8.2*  --    GFR: Estimated Creatinine Clearance: 23.9 mL/min (A) (by C-G formula based on SCr of 2.7 mg/dL (H)). Recent Labs  Lab 12/11/23 1641  WBC 8.6    Liver Function Tests: Recent Labs  Lab 12/11/23 1641  AST 54*  ALT 90*  ALKPHOS 245*  BILITOT 1.3*  PROT 6.2*  ALBUMIN  3.0*   No results for input(s): LIPASE, AMYLASE in the last 168 hours. No results for input(s): AMMONIA in the last 168 hours.  ABG    Component Value Date/Time   HCO3 19.0 (L) 10/19/2023 1845   TCO2 28 12/11/2023 1646   ACIDBASEDEF 6.0 (H) 10/19/2023 1845   O2SAT 83 10/19/2023 1845     Coagulation Profile: Recent Labs  Lab 12/11/23 1641  INR 1.1    Cardiac Enzymes: No results for input(s): CKTOTAL, CKMB, CKMBINDEX, TROPONINI in the last 168 hours.  HbA1C: Hgb A1c MFr Bld  Date/Time Value Ref Range Status  10/20/2023 09:08 AM 8.6 (H) 4.8 - 5.6 % Final    Comment:    (NOTE) Diagnosis of Diabetes The following HbA1c ranges recommended by the American Diabetes Association (ADA) may be used as an aid in the diagnosis of diabetes mellitus.  Hemoglobin             Suggested A1C NGSP%              Diagnosis  <5.7                   Non Diabetic  5.7-6.4                 Pre-Diabetic  >6.4                   Diabetic  <7.0                   Glycemic control for                       adults with diabetes.    09/09/2021 02:51 AM 12.4 (H) 4.8 - 5.6 % Final    Comment:    (NOTE) Pre diabetes:          5.7%-6.4%  Diabetes:              >6.4%  Glycemic control for   <7.0% adults with diabetes     CBG: No results for input(s): GLUCAP in the last 168  hours.  Review of Systems:   Review of Systems  Constitutional:  Negative for weight loss.  Respiratory:  Positive for shortness of breath.   Cardiovascular:  Positive for orthopnea, leg swelling and PND. Negative for chest pain.  Gastrointestinal:  Positive for abdominal pain.   Past Medical History:  She,  has a past medical history of Acute respiratory failure with hypoxia (HCC) (10/09/2023), Anemia, CKD (chronic kidney disease) stage V requiring chronic dialysis (HCC) (05/08/2012), Constipation, Coronary artery disease, CVA (cerebral vascular accident) (HCC) (05/12/2021), Diabetes mellitus without complication (HCC), Diabetic retinopathy (HCC), ESRD on hemodialysis (HCC) (06/05/2014), H/O detached retina repair, History of CVA (cerebrovascular accident), Hyperlipidemia, Hypertension, Insulin  overdose (05/08/2012), Kidney transplanted, Pneumonia, PVD (peripheral vascular disease), S/P drug eluting coronary stent placement (08/24/2021), and Suicide attempt (HCC) (05/08/2012).   Surgical History:   Past Surgical History:  Procedure Laterality Date   ARTERIOVENOUS GRAFT PLACEMENT Left 07/28/2010   AV FISTULA PLACEMENT Right 11/25/2015   Procedure: INSERTION OF ARTERIOVENOUS (AV) GORE-TEX GRAFT RIGHT  ARM USING 4-7 MM X 45 CM STRETCH GRAFT.;  Surgeon: Lonni GORMAN Blade, MD;  Location: [facility] OR;  Service: Vascular;  Laterality: Right;   basilic vein transposition Left 02/26/2009   CARDIAC CATHETERIZATION     CESAREAN SECTION     CHOLECYSTECTOMY N/A 10/08/2014   Procedure: LAPAROSCOPIC  CHOLECYSTECTOMY ;  Surgeon: Debby Shipper, MD;  Location: [facility] OR;  Service: General;  Laterality: N/A;   EYE SURGERY     retinal detachment   KIDNEY TRANSPLANT  04/28/2016   REVISION OF ARTERIOVENOUS GORETEX GRAFT Left 05/13/2015   Procedure: REVISION OF ARTERIOVENOUS GORETEX GRAFT;  Surgeon: Lonni GORMAN Blade, MD;  Location: [facility] OR;  Service: Vascular;  Laterality: Left;     Social History:   reports that she has never smoked. She has never used smokeless tobacco. She reports that she does not drink alcohol and does not use drugs.   Family History:  Her family history includes Diabetes in her maternal grandmother; Gout in her maternal uncle.   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   Latex Itching   Nsaids Other (See Comments)    Hx kidney transplant   Tylenol  [Acetaminophen ] Other (See Comments)    Patient states she can not take due to kidney   Shellfish Allergy Other (See Comments)    Hx kidney transplant - told not to consume   Lactose Nausea Only   Misc. Sulfonamide Containing Compounds Itching and Other (See Comments)    Hx kidney transplant     Home Medications  Prior to Admission medications   Medication Sig Start Date End Date Taking? Authorizing Provider  amLODipine  (NORVASC ) 5 MG tablet Take 5 mg by mouth daily. 01/03/18   [provider]  aspirin  EC 81 MG tablet Take 81 mg by mouth daily. Swallow whole.    [provider]  clopidogrel  (PLAVIX ) 75 MG tablet Take 75 mg by mouth daily.    [provider]  FARXIGA  10 MG TABS tablet Take 10 mg by mouth daily.  09/08/23   [provider]  Insulin  Aspart FlexPen (NOVOLOG )  100 UNIT/ML Inject 8-13 Units into the skin 3 (three) times daily with meals. Inject per sliding scale:  70-200    8 units 201-250  9 units 251-300  10 units 301-350  11 units 351-400  12 units 401+       13 units 10/23/21   [provider]  insulin  degludec (TRESIBA ) 200 UNIT/ML FlexTouch Pen Inject 15 Units into the skin 2 (two) times daily with a meal.    [provider]  levonorgestrel (MIRENA) 20 MCG/24HR IUD 1 each by Intrauterine route once.    [provider]  midodrine  (PROAMATINE ) 5 MG tablet Take 1 tablet (5 mg total) by mouth 3 (three) times daily with meals. 11/23/23   Amin, Sumayya, MD  MOUNJARO 7.5 MG/0.5ML Pen Inject 7.5 mg into the skin every Tuesday. 05/13/23   [provider]  multivitamin (RENA-VIT) TABS tablet Take 1 tablet by mouth at bedtime. 11/23/23   Caleen Qualia, MD  mycophenolate  (CELLCEPT ) 250 MG capsule Take 1 capsule (250 mg total) by mouth 2 (two) times daily. 11/23/23   Caleen Qualia, MD  pantoprazole  (PROTONIX ) 40 MG tablet Take 1 tablet (40 mg total) by mouth daily. 11/24/23   Amin, Sumayya, MD  predniSONE  (DELTASONE ) 10 MG tablet Please take 50 mg / 5 tablets for next 2 days, decrease 1 tablet every 3 days until you have finished this course and then resume home prednisone  11/23/23   Amin, Sumayya, MD  predniSONE  (DELTASONE ) 5 MG tablet Take 5 mg by mouth daily.    [provider]  rosuvastatin  (CRESTOR ) 20 MG tablet Take 1 tablet (20 mg total) by mouth daily. 09/11/21 02/26/24  Elgergawy, Brayton RAMAN, MD  sodium bicarbonate  650 MG tablet Take 2 tablets (1,300 mg total) by mouth 3 (three) times daily. 11/23/23   Caleen Qualia, MD  tacrolimus  (PROGRAF ) 1 MG capsule Take 3-4 mg by mouth in the morning and at bedtime. Take 3 capsules by mouth in the morning and 4 capsules  in the evening.    [provider]  torsemide (DEMADEX) 20 MG tablet Take 20 mg  by mouth daily. 10/22/23   [provider]     Critical care time: 30 minutes    The patient is critically ill with multiple organ system failure and requires high complexity decision making for assessment and support, frequent evaluation and titration of therapies, advanced monitoring, review of radiographic studies and interpretation of complex data.   Critical Care Time devoted to patient care services, exclusive of separately billable procedures, described in this note is 30 minutes.  Rexene LOISE Blush, PA-C [facility] 12/11/23 6:41 PM  Please see [email or web address] for pager details.  From 7A-7P if no response, please call [phone] After hours, please call ELink [phone]

## 2023-12-11 NOTE — Procedures (Signed)
 Central Venous Catheter Insertion Procedure Note  Morgan [name]  [id number]  1970-05-18  Date:12/11/23  Time:8:27 PM   Provider Performing:Lether Tesch   Procedure: Insertion of Non-tunneled Central Venous Catheter(36556)with US  guidance (23062)    Indication(s) Difficult access and Hemodialysis  Consent Risks of the procedure as well as the alternatives and risks of each were explained to the patient and/or caregiver.  Consent for the procedure was obtained and is signed in the bedside chart  Anesthesia Topical only with 1% lidocaine    Timeout Verified patient identification, verified procedure, site/side was marked, verified correct patient position, special equipment/implants available, medications/allergies/relevant history reviewed, required imaging and test results available.  Sterile Technique Maximal sterile technique including full sterile barrier drape, hand hygiene, sterile gown, sterile gloves, mask, hair covering, sterile ultrasound probe cover (if used).  Procedure Description Area of catheter insertion was cleaned with chlorhexidine  and draped in sterile fashion.   With real-time ultrasound guidance a HD catheter was placed into the right internal jugular vein.  Nonpulsatile blood flow and easy flushing noted in all ports.  The catheter was sutured in place and sterile dressing applied.  Complications/Tolerance None; patient tolerated the procedure well. Chest X-ray is ordered to verify placement for internal jugular or subclavian cannulation.  Chest x-ray is not ordered for femoral cannulation.  EBL Minimal  Specimen(s) None  POCUS IMAGE OF RIGHT internal jugular GUIDEWIRE     Tamela Stakes, MD  Attending Physician, Critical Care Medicine [facility] See Amion for pager If no response to pager, please call [phone] until 7pm After 7pm, Please call E-link [phone]

## 2023-12-11 NOTE — Progress Notes (Signed)
 Per MD Mohammed, ok to use HD catheter.   DG Chest obtained and in chart.

## 2023-12-11 NOTE — Progress Notes (Signed)
 Pt transported to 2m without complication. Attempted to give pt a break off of bipap and wear the HHFNC 60L 60% and pt was unable to tolerate more than 20 minutes.  ABG attempted x2 on LR and LB. While attempting LB, it was noticed that pt has a fistula on that arm. Stick was removed and RN made aware that pt needs restricted extremity bracelets placed.   ABG unable to be obtained at this time.

## 2023-12-11 NOTE — Progress Notes (Signed)
 eLink Physician-Brief Progress Note Patient Name: Morgan [name] DOB: 1970/10/30 MRN: [id number]   Date of Service  12/11/2023  HPI/Events of Note  Patient with ESRD-DD presenting from dialysis facility with pulmonary edema, acute hypoxemic respiratory failure and r/o sepsis. Work up and urgent dialysis in progress. She is on BIPAP.  eICU Interventions  New patient Evaluation.        Pauleen Goleman U Maribelle Hopple 12/11/2023, 8:26 PM

## 2023-12-11 NOTE — ED Notes (Addendum)
 Provider at bedside.. states to d/c fluids and advised to call respiratory to place patient on bipap

## 2023-12-11 NOTE — Progress Notes (Signed)
 RN received call from radiology. Per radiology hd catheter recommended to be pulled back 1 cm.   RN notified MD Mohammed. Per MD ok to continue use of catheter.

## 2023-12-11 NOTE — Progress Notes (Signed)
 Pt placed on HHFNC with WOB and increased O2 needs. Poor candidate for bipap due to continuously coughing up pink frothy sputum and nausea. ED MD at bedside. TXA administered.

## 2023-12-11 NOTE — Consult Note (Signed)
 ESRD Consult Note  Requesting provider: Fahid Alghanim Service requesting consult: CCM Reason for consult: ESRD, provision of dialysis Indication for acute dialysis?: End Stage Renal Disease  Outpatient dialysis unit: [facility] Outpatient dialysis prescription: Recent new start, minimal active orders  Assessment/Recommendations: Morgan [name] is a/an 53 y.o. female with a past medical history notable for ESRD on HD admitted with sepsis, hemoptysis, and acute hypoxic respiratory failure.   # ESRD: Urgent HD overnight for pulmonary edema.  Will do dry ultrafiltration given minimal needs for clearance.  Fairly low blood pressures likely related to sepsis.  Primary team will place trialysis catheter so CRRT can be used if needed.  # Volume/ hypertension: Needs to establish dry weight over the long-term.  Volume overloaded at this time.  Urgent dialysis with ultrafiltration as above.  Hold home blood pressure medications  # Anemia of Chronic Kidney Disease: Hemoglobin at goal without ESA  # Secondary Hyperparathyroidism/Hyperphosphatemia: Not on any outpatient medications.  Continue to monitor labs routinely  # Vascular access: AVG with no issues  # Sepsis: Initially no fevers but has had a elevated temperature in the ICU.  Mostly sounds like a virus but because of her immunosuppressed status recommend broad coverage at this time and further testing for viral and bacterial infections.  Defer to primary team.  Currently no pressors required but will monitor closely  # History of renal transplant: Now failed.  Still on full immunosuppression in the outpatient setting.  Because of infection we will hold mycophenolate .  Will also decrease tacrolimus  to 2mg  BID.  # Hypokalemia: Potassium 3.2.  Continue to monitor.  Replace as needed.  # Additional recommendations: - Dose all meds for creatinine clearance < 10 ml/min  - Unless absolutely necessary, no MRIs with gadolinium.  - Implement save  arm precautions.  Prefer needle sticks in the dorsum of the hands or wrists.  No blood pressure measurements in arm. - If blood transfusion is requested during hemodialysis sessions, please alert us  prior to the session.  - Use synthetic opioids (Fentanyl /Dilaudid ) if needed  Recommendations were discussed with the primary team.   History of Present Illness: Morgan [name] is a/an 53 y.o. female with a past medical history of ESRD who presents with hypotension and bloody sputum  The patient was on BiPAP so most of the history was obtained from the patient's husband.  Patient's husband states that he has not been feeling well for couple weeks.  Thought it was allergies but over the past few days his wife has also said that she has had some congestion and an cough.  She was recently hospitalized and had worsening kidney failure.  History of a transplant that is now failing.  Follows with Dr. Tobie in the outpatient setting.  Because of her worsening kidney failure she was set up to start dialysis today at [place].  She underwent a couple hours of dialysis and experiencing low blood pressures.  At home blood pressures have been mainly high but in the past when she was on dialysis she would experience low blood pressures as well.  She denies recent fevers, chest pain, shortness of breath, nausea, vomiting, diarrhea, dysuria, hematuria.  At the end of dialysis she experienced low blood pressures as well as some hemoptysis.  It sounds scant without significant bleeding.  In the emergency department she was significantly hypoxic requiring BiPAP.  Chest x-ray was concerning for pulmonary edema.  She did ultimately have some fevers.  Labs mostly consistent with chronic kidney  disease/ESRD.   Medications:  Current Facility-Administered Medications  Medication Dose Route Frequency Provider Last Rate Last Admin   albuterol  (PROVENTIL ) (2.5 MG/3ML) 0.083% nebulizer solution            [START ON  12/12/2023] aspirin  EC tablet 81 mg  81 mg Oral Daily Wilson, Tara N, PA-C       Chlorhexidine  Gluconate Cloth 2 % PADS 6 each  6 each Topical Daily Alghanim, Fahid, MD       [START ON 12/12/2023] Chlorhexidine  Gluconate Cloth 2 % PADS 6 each  6 each Topical Q0600 Macel Jayson PARAS, MD       docusate sodium  (COLACE) capsule 100 mg  100 mg Oral BID PRN Alghanim, Fahid, MD       furosemide  (LASIX ) injection 60 mg  60 mg Intravenous Once Wilson, Tara N, PA-C       heparin  injection 5,000 Units  5,000 Units Subcutaneous Q8H Alghanim, Fahid, MD       insulin  aspart (novoLOG ) injection 0-6 Units  0-6 Units Subcutaneous Q4H Wilson, Tara N, PA-C       [START ON 12/12/2023] midodrine  (PROAMATINE ) tablet 5 mg  5 mg Oral TID WC Wilson, Tara N, PA-C       ondansetron  (ZOFRAN ) injection 4 mg  4 mg Intravenous Q6H PRN Ogan, Okoronkwo U, MD       pantoprazole  (PROTONIX ) EC tablet 40 mg  40 mg Oral Daily Wilson, Tara N, PA-C       polyethylene glycol (MIRALAX  / GLYCOLAX ) packet 17 g  17 g Oral Daily PRN Alghanim, Fahid, MD       potassium chloride  10 mEq in 100 mL IVPB  10 mEq Intravenous Q1 Hr x 4 Wilson, Tara N, PA-C 100 mL/hr at 12/11/23 1824 10 mEq at 12/11/23 1824   predniSONE  (DELTASONE ) tablet 5 mg  5 mg Oral Daily Wilson, Tara N, PA-C       [START ON 12/12/2023] tacrolimus  (PROGRAF ) capsule 3 mg  3 mg Oral Daily Wilson, Tara N, PA-C       And   tacrolimus  (PROGRAF ) capsule 4 mg  4 mg Oral QHS Wilson, Tara N, PA-C         ALLERGIES Fluorescite [fluorescein]; Iodinated contrast media; Iohexol ; Egg solids, whole; Latex; Nsaids; Tylenol  [acetaminophen ]; Shellfish allergy; Lactose; and Misc. sulfonamide containing compounds  MEDICAL HISTORY Past Medical History:  Diagnosis Date   Acute respiratory failure with hypoxia (HCC) 10/09/2023   Anemia    low iron   CKD (chronic kidney disease) stage V requiring chronic dialysis (HCC) 05/08/2012   Constipation    when given iron   Coronary artery disease     CVA (cerebral vascular accident) (HCC) 05/12/2021   Diabetes mellitus without complication (HCC)    type 2   Diabetic retinopathy (HCC)    ESRD on hemodialysis (HCC) 06/05/2014   H/O detached retina repair    bilateral   History of CVA (cerebrovascular accident)    Hyperlipidemia    Hypertension    Insulin  overdose 05/08/2012   Kidney transplanted    Pneumonia    PVD (peripheral vascular disease)    S/P drug eluting coronary stent placement 08/24/2021   AT [facility]   Suicide attempt [facility]) 05/08/2012     SOCIAL HISTORY Social History   Socioeconomic History   Marital status: Married    Spouse name: Not on file   Number of children: Not on file   Years of education: Not on file  Highest education level: Not on file  Occupational History   Not on file  Tobacco Use   Smoking status: Never   Smokeless tobacco: Never  Vaping Use   Vaping status: Never Used  Substance and Sexual Activity   Alcohol use: No    Alcohol/week: 0.0 standard drinks of alcohol   Drug use: No   Sexual activity: Not on file  Other Topics Concern   Not on file  Social History Narrative   L handed   Social Drivers of Health   Financial Resource Strain: Low Risk  (09/18/2023)   Received from [facility]   Overall Financial Resource Strain (CARDIA)    Difficulty of Paying Living Expenses: Not very hard  Food Insecurity: No Food Insecurity (11/20/2023)   Hunger Vital Sign    Worried About Running Out of Food in the Last Year: Never true    Ran Out of Food in the Last Year: Never true  Transportation Needs: No Transportation Needs (11/20/2023)   PRAPARE - Administrator, Civil Service (Medical): No    Lack of Transportation (Non-Medical): No  Physical Activity: Insufficiently Active (06/21/2020)   Received from [facility]   Exercise Vital Sign    On average, how many days per week do you engage in moderate to strenuous exercise (like a  brisk walk)?: 3 days    On average, how many minutes do you engage in exercise at this level?: 30 min  Stress: Stress Concern Present (06/21/2020)   Received from [facility] of Occupational Health - Occupational Stress Questionnaire    Feeling of Stress : Rather much  Social Connections: Not on file  Intimate Partner Violence: Not At Risk (11/20/2023)   Humiliation, Afraid, Rape, and Kick questionnaire    Fear of Current or Ex-Partner: No    Emotionally Abused: No    Physically Abused: No    Sexually Abused: No     FAMILY HISTORY Family History  Problem Relation Age of Onset   Diabetes Maternal Grandmother    Gout Maternal Uncle      Review of Systems: 12 systems were reviewed and negative except per HPI  Physical Exam: Vitals:   12/11/23 1905 12/11/23 1915  BP: 98/67   Pulse: (!) 124   Resp: 15   Temp:    SpO2: 93% 100%   No intake/output data recorded. No intake or output data in the 24 hours ending 12/11/23 1940 General: Ill-appearing, sitting up in bed, no distress HEENT: anicteric sclera, MMM CV: Tachycardic, no murmurs, no lower extremity edema Lungs: bilateral chest rise, on BiPAP, mild increased work of breathing, coarse bilateral breath sounds Abd: soft, non-tender, non-distended Skin: no visible lesions or rashes Psych: alert, engaged, appropriate mood and affect Neuro: normal speech, no gross focal deficits   Test Results Reviewed Lab Results  Component Value Date   NA 144 12/11/2023   K 3.2 (L) 12/11/2023   CL 101 12/11/2023   CO2 27 12/11/2023   BUN 25 (H) 12/11/2023   CREATININE 2.70 (H) 12/11/2023   CALCIUM  8.2 (L) 12/11/2023   ALBUMIN  3.0 (L) 12/11/2023   PHOS 5.5 (H) 11/23/2023    I have reviewed relevant outside healthcare records

## 2023-12-11 NOTE — ED Notes (Signed)
 Confirmed with dr young to still start a bolus.. Advised to start at 250 mL instead.

## 2023-12-11 NOTE — Plan of Care (Signed)
 Seen and examined patient bedside.  She is tolerating BiPAP very well.  Chest x-ray reviewed showed pulmonary edema.  Husband mentions some URI-like symptoms at home.  Will check a respiratory pathogen panel.  I discussed with nephrologist Dr. Macel at bedside.  He indicated given her maps close to 29 he will try hemodialysis.  However recommended putting in a trialysis line in case we go CRRT route.  Patient also has difficulty access therefore I went for a right IJ dialysis line.  Patient tolerated procedure well.  Levophed  is ordered in case she becomes hypotensive during HD.  Tamela Stakes, MD  Attending Physician, Critical Care Medicine [facility] See Amion for pager If no response to pager, please call [phone] until 7pm After 7pm, Please call E-link [phone]

## 2023-12-12 ENCOUNTER — Inpatient Hospital Stay (HOSPITAL_COMMUNITY)

## 2023-12-12 DIAGNOSIS — R579 Shock, unspecified: Secondary | ICD-10-CM

## 2023-12-12 LAB — BASIC METABOLIC PANEL WITH GFR
Anion gap: 16 — ABNORMAL HIGH (ref 5–15)
BUN: 33 mg/dL — ABNORMAL HIGH (ref 6–20)
CO2: 23 mmol/L (ref 22–32)
Calcium: 8.7 mg/dL — ABNORMAL LOW (ref 8.9–10.3)
Chloride: 102 mmol/L (ref 98–111)
Creatinine, Ser: 3.64 mg/dL — ABNORMAL HIGH (ref 0.44–1.00)
GFR, Estimated: 14 mL/min — ABNORMAL LOW (ref >60)
Glucose, Bld: 132 mg/dL — ABNORMAL HIGH (ref 70–99)
Potassium: 4.3 mmol/L (ref 3.5–5.1)
Sodium: 141 mmol/L (ref 135–145)

## 2023-12-12 LAB — CBC
HCT: 29.7 % — ABNORMAL LOW (ref 36.0–46.0)
Hemoglobin: 9.1 g/dL — ABNORMAL LOW (ref 12.0–15.0)
MCH: 27.4 pg (ref 26.0–34.0)
MCHC: 30.6 g/dL (ref 30.0–36.0)
MCV: 89.5 fL (ref 80.0–100.0)
Platelets: 273 K/uL (ref 150–400)
RBC: 3.32 MIL/uL — ABNORMAL LOW (ref 3.87–5.11)
RDW: 15.7 % — ABNORMAL HIGH (ref 11.5–15.5)
WBC: 11.3 K/uL — ABNORMAL HIGH (ref 4.0–10.5)
nRBC: 0 % (ref 0.0–0.2)

## 2023-12-12 LAB — BLOOD GAS, VENOUS
Acid-base deficit: 1.3 mmol/L (ref 0.0–2.0)
Bicarbonate: 22.7 mmol/L (ref 20.0–28.0)
O2 Saturation: 95.9 %
Patient temperature: 36.3
pCO2, Ven: 34 mmHg — ABNORMAL LOW (ref 44–60)
pH, Ven: 7.43 (ref 7.25–7.43)
pO2, Ven: 62 mmHg — ABNORMAL HIGH (ref 32–45)

## 2023-12-12 LAB — GLUCOSE, CAPILLARY
Glucose-Capillary: 104 mg/dL — ABNORMAL HIGH (ref 70–99)
Glucose-Capillary: 116 mg/dL — ABNORMAL HIGH (ref 70–99)
Glucose-Capillary: 163 mg/dL — ABNORMAL HIGH (ref 70–99)
Glucose-Capillary: 169 mg/dL — ABNORMAL HIGH (ref 70–99)
Glucose-Capillary: 187 mg/dL — ABNORMAL HIGH (ref 70–99)
Glucose-Capillary: 190 mg/dL — ABNORMAL HIGH (ref 70–99)
Glucose-Capillary: 75 mg/dL (ref 70–99)
Glucose-Capillary: 76 mg/dL (ref 70–99)
Glucose-Capillary: 76 mg/dL (ref 70–99)

## 2023-12-12 LAB — MAGNESIUM: Magnesium: 1.8 mg/dL (ref 1.7–2.4)

## 2023-12-12 LAB — RESP PANEL BY RT-PCR (RSV, FLU A&B, COVID)  RVPGX2
Influenza A by PCR: NEGATIVE
Influenza B by PCR: NEGATIVE
Resp Syncytial Virus by PCR: NEGATIVE
SARS Coronavirus 2 by RT PCR: NEGATIVE

## 2023-12-12 MED ORDER — PANTOPRAZOLE SODIUM 40 MG IV SOLR
40.0000 mg | INTRAVENOUS | Status: DC
  Administered 2023-12-12 – 2023-12-13 (×2): 40 mg via INTRAVENOUS
  Filled 2023-12-12: qty 10

## 2023-12-12 MED ORDER — CHLORHEXIDINE GLUCONATE CLOTH 2 % EX PADS
6.0000 | MEDICATED_PAD | Freq: Every day | CUTANEOUS | Status: DC
  Administered 2023-12-12 – 2023-12-13 (×2): 6 via TOPICAL

## 2023-12-12 MED ORDER — ACETAMINOPHEN 650 MG RE SUPP: 650.0000 mg | RECTAL | Status: DC | PRN

## 2023-12-12 MED ORDER — SODIUM CHLORIDE 0.9 % IV SOLN
500.0000 mg | INTRAVENOUS | Status: DC
  Filled 2023-12-12: qty 5

## 2023-12-12 MED ORDER — AZITHROMYCIN 500 MG PO TABS
500.0000 mg | ORAL_TABLET | Freq: Every day | ORAL | Status: AC
Start: 2023-12-12 — End: ?
  Administered 2023-12-12 – 2023-12-14 (×3): 500 mg via ORAL
  Filled 2023-12-12 (×2): qty 1
  Filled 2023-12-12: qty 2

## 2023-12-12 MED ORDER — FUROSEMIDE 10 MG/ML IJ SOLN
80.0000 mg | Freq: Two times a day (BID) | INTRAMUSCULAR | Status: DC
  Administered 2023-12-12 – 2023-12-14 (×5): 80 mg via INTRAVENOUS
  Filled 2023-12-12 (×5): qty 8

## 2023-12-12 MED ORDER — SODIUM CHLORIDE 0.9 % IV SOLN
2.0000 g | INTRAVENOUS | Status: AC
  Administered 2023-12-12 – 2023-12-13 (×2): 2 g via INTRAVENOUS
  Filled 2023-12-12 (×2): qty 20

## 2023-12-12 MED ORDER — HEPARIN SODIUM (PORCINE) 1000 UNIT/ML IJ SOLN
INTRAMUSCULAR | Status: AC
Start: 2023-12-12 — End: 2023-12-12
  Filled 2023-12-12: qty 3

## 2023-12-12 MED ORDER — HYDROCORTISONE SOD SUC (PF) 100 MG IJ SOLR
100.0000 mg | Freq: Two times a day (BID) | INTRAMUSCULAR | Status: DC
  Administered 2023-12-12 (×2): 100 mg via INTRAVENOUS
  Filled 2023-12-12 (×2): qty 2
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
## 2023-12-12 NOTE — Progress Notes (Signed)
 Pt receives out-pt HD at [facility] [place] on MWF 11:00 am chair time. Will assist as needed.   Randine Mungo Dialysis Navigator [phone]

## 2023-12-12 NOTE — Progress Notes (Signed)
 Pt on BIPAP at this time. HHFNC on standby in the room.   12/12/23 1920  BiPAP/CPAP/SIPAP  BiPAP/CPAP/SIPAP Pt Type Adult  BiPAP/CPAP/SIPAP SERVO  Mask Type Full face mask  Dentures removed? Not applicable  Mask Size Medium  Set Rate 15 breaths/min  Respiratory Rate 22 breaths/min  IPAP 10 cmH20  EPAP 5 cmH2O  Pressure Support 5 cmH20  PEEP 5 cmH20  FiO2 (%) 50 %  Flow Rate 0 lpm  Minute Ventilation 11.1  Leak 28  Peak Inspiratory Pressure (PIP) 14  Tidal Volume (Vt) 549  Patient Home Machine No  Patient Home Mask No  Patient Home Tubing No  Auto Titrate No  Press High Alarm 40 cmH2O  Nasal massage performed Yes  CPAP/SIPAP surface wiped down Yes  Device Plugged into RED Power Outlet Yes  Oxygen Percent 50 %

## 2023-12-12 NOTE — Progress Notes (Addendum)
 [facility] Progress Note  Subjective:  Seen in ICU No further hemoptysis Had HD overnight w/ 1.6 L off O2 down from HFNC to 4 L [place] On levo gtt 15 micrograms/min  Vitals:   12/12/23 0915 12/12/23 0930 12/12/23 0945 12/12/23 1000  BP: (!) 92/53 98/61    Pulse: (!) 112 (!) 114 (!) 116 (!) 111  Resp: (!) 24 20 (!) 25 19  Temp:      TempSrc:      SpO2: 100% 97% 100% 100%  Weight:      Height:        Exam: General: chronically ill-appearing, alert, 4 L [place] O2 (off bipap) CV: RRR, no RG Lungs: coarse BS, no rales/ wheezing, wob ok Abd: soft, non-tender, non-distended, obese Ext: 1+ bilat LE edema Neuro: alert, OX3, interacts appropriately    RUA AVG+bruit / temp RIJ HD cath  Home bp meds: Norvasc  5mg  every day (paused) Midodrine  5mg  tid Demadex 20mg  every day (paused)   OP HD: MWF [facility] 2.5h (1st session)  B250  87.5kg  3K  AVG  Heparin  4000 1st/ last OP HD was 10/15, post wt 86kg No meds yet   Assessment/ Plan: Pulm edema/ resp distress: looks better today, wob better and off bipap. Will add IV Lasix  80 bid as still making good amts of urine. Next HD Friday.  Sepsis: temp ^ in ICU, getting IV abx for suspected infection ESRD: recently returned to HD after 8 yrs of renal transplant. Had urgent HD overnight for pulm edema w/ resp distress. 1.6 L removed, low BP's limited UF. Has temp cath R neck in case needs CRRT. Plan is for regular HD again Friday.  Volume: need to establish dry weight over the long-term. Some LE edema, not severe.  Vascular access: RUA AVG good function at outpt HD yest and overnight in ICU. No issues.  H/o renal transplant: 2018 to now. Still on full immunosuppression in the OP setting. Holding cellcept  short-term due to potential infection. Also we decreased tacrolimus  to 2mg  BID. Anemia esrd: Hemoglobin at goal without ESA.  Secondary Hyperparathyroidism: Not on any outpatient medications.  Continue to monitor labs routinely   Myer Fret MD  [facility] 12/12/2023, 10:15 AM  Recent Labs  Lab 12/11/23 1641 12/11/23 1646 12/12/23 0623  HGB 10.0* 10.5* 9.1*  ALBUMIN  3.0*  --   --   CALCIUM  8.2*  --  8.7*  CREATININE 2.70* 2.70* 3.64*  K 3.2* 3.2* 4.3   No results for input(s): IRON, TIBC, FERRITIN in the last 168 hours. Inpatient medications:  aspirin  EC  81 mg Oral Daily   azithromycin   500 mg Oral Daily   Chlorhexidine  Gluconate Cloth  6 each Topical Daily   Chlorhexidine  Gluconate Cloth  6 each Topical Q0600   furosemide   80 mg Intravenous Q12H   heparin   5,000 Units Subcutaneous Q8H   hydrocortisone  sod succinate (SOLU-CORTEF ) inj  100 mg Intravenous Q12H   insulin  aspart  0-6 Units Subcutaneous Q4H   midodrine   5 mg Oral TID WC   pantoprazole  (PROTONIX ) IV  40 mg Intravenous Q24H   tacrolimus   2 mg Oral BID    cefTRIAXone  (ROCEPHIN )  IV 2 g (12/12/23 0921)   norepinephrine  (LEVOPHED ) Adult infusion 14 mcg/min (12/12/23 0900)   acetaminophen , docusate sodium , ondansetron  (ZOFRAN ) IV, polyethylene glycol

## 2023-12-12 NOTE — Progress Notes (Signed)
 RN removed dexcom from right upper arm and left upper arm.   Per patient, ok to dispose from monitors.

## 2023-12-12 NOTE — Progress Notes (Signed)
 Pt admitted overnight for sob and pink sputum at dialysis center at her first dialysis session Hx of renal transplant in 2018- on cellcept , tacrolimus , 5 mg predisone at home- failed transplant' URI symptoms at home for a week Shock- likely septic shock Acute respiratory failure Chronic anemia  Underwent   - start ceftriaxone +azithromycin  - viral respi panel negative, send sputum and blood culture - start stress dose steroids - wean off oxygen as able to - keep MAP at 65 using vasopressors  Monitor in ICU.    Additional CC time 30 min  I have taken an interval history, reviewed the chart and examined the patient myself.  I have independently spent a total time of 30-minutes related to patient care.  Akaysha Cobern Pleas, M.D. [facility] Pulmonary/Critical Care Medicine 12/12/2023 9:01 AM

## 2023-12-12 NOTE — Progress Notes (Signed)
 Called [facility] who confirmed prior to admission regimen of anti-rejection therapies (last seen by them 10/2023).  Tacrolimus  4mg  PO QAM and 3mg  QPM (goal level 5-7)  MMF 500 mg PO QAM and 500 mg PO QPM Prednisone  5mg  PO daily  Sharyne Glatter, PharmD, BCCCP Critical Care Clinical Pharmacist 12/12/2023 2:57 PM

## 2023-12-13 ENCOUNTER — Inpatient Hospital Stay (HOSPITAL_COMMUNITY)

## 2023-12-13 LAB — HEPATITIS B SURFACE ANTIBODY, QUANTITATIVE: Hep B S AB Quant (Post): 23.2 m[IU]/mL

## 2023-12-13 LAB — GLUCOSE, CAPILLARY
Glucose-Capillary: 154 mg/dL — ABNORMAL HIGH (ref 70–99)
Glucose-Capillary: 149 mg/dL — ABNORMAL HIGH (ref 70–99)
Glucose-Capillary: 117 mg/dL — ABNORMAL HIGH (ref 70–99)
Glucose-Capillary: 226 mg/dL — ABNORMAL HIGH (ref 70–99)
Glucose-Capillary: 368 mg/dL — ABNORMAL HIGH (ref 70–99)

## 2023-12-13 LAB — BASIC METABOLIC PANEL WITH GFR
Anion gap: 16 — ABNORMAL HIGH (ref 5–15)
BUN: 47 mg/dL — ABNORMAL HIGH (ref 6–20)
CO2: 24 mmol/L (ref 22–32)
Calcium: 9.2 mg/dL (ref 8.9–10.3)
Chloride: 101 mmol/L (ref 98–111)
Creatinine, Ser: 4.82 mg/dL — ABNORMAL HIGH (ref 0.44–1.00)
GFR, Estimated: 10 mL/min — ABNORMAL LOW (ref >60)
Glucose, Bld: 171 mg/dL — ABNORMAL HIGH (ref 70–99)
Potassium: 4.2 mmol/L (ref 3.5–5.1)
Sodium: 141 mmol/L (ref 135–145)

## 2023-12-13 LAB — CBC
HCT: 23.9 % — ABNORMAL LOW (ref 36.0–46.0)
Hemoglobin: 7.4 g/dL — ABNORMAL LOW (ref 12.0–15.0)
MCH: 27.2 pg (ref 26.0–34.0)
MCHC: 31 g/dL (ref 30.0–36.0)
MCV: 87.9 fL (ref 80.0–100.0)
Platelets: 220 K/uL (ref 150–400)
RBC: 2.72 MIL/uL — ABNORMAL LOW (ref 3.87–5.11)
RDW: 15.2 % (ref 11.5–15.5)
WBC: 4.2 K/uL (ref 4.0–10.5)
nRBC: 0 % (ref 0.0–0.2)

## 2023-12-13 LAB — MAGNESIUM: Magnesium: 1.8 mg/dL (ref 1.7–2.4)

## 2023-12-13 MED ORDER — PREDNISONE 20 MG PO TABS
20.0000 mg | ORAL_TABLET | Freq: Every day | ORAL | Status: AC
  Administered 2023-12-14: 20 mg via ORAL
  Filled 2023-12-13: qty 1

## 2023-12-13 MED ORDER — LIDOCAINE-PRILOCAINE 2.5-2.5 % EX CREA: 1.0000 | TOPICAL_CREAM | CUTANEOUS | Status: DC | PRN

## 2023-12-13 MED ORDER — ACETAMINOPHEN 500 MG PO TABS
1000.0000 mg | ORAL_TABLET | Freq: Three times a day (TID) | ORAL | Status: DC | PRN
  Filled 2023-12-13: qty 2

## 2023-12-13 MED ORDER — CEFUROXIME AXETIL 250 MG PO TABS
500.0000 mg | ORAL_TABLET | Freq: Every day | ORAL | Status: DC
  Filled 2023-12-13: qty 2

## 2023-12-13 MED ORDER — ALBUMIN HUMAN 25 % IV SOLN: 25.0000 g | Freq: Once | INTRAVENOUS | Status: DC

## 2023-12-13 MED ORDER — ANTICOAGULANT SODIUM CITRATE 4% (200MG/5ML) IV SOLN: 5.0000 mL | Status: DC | PRN

## 2023-12-13 MED ORDER — HYDROCORTISONE SOD SUC (PF) 100 MG IJ SOLR
100.0000 mg | Freq: Every day | INTRAMUSCULAR | Status: AC
  Administered 2023-12-13: 100 mg via INTRAVENOUS
  Filled 2023-12-13: qty 2

## 2023-12-13 MED ORDER — MAGNESIUM SULFATE 2 GM/50ML IV SOLN
2.0000 g | Freq: Once | INTRAVENOUS | Status: AC
  Administered 2023-12-13: 2 g via INTRAVENOUS
  Filled 2023-12-13: qty 50

## 2023-12-13 MED ORDER — HEPARIN SODIUM (PORCINE) 1000 UNIT/ML DIALYSIS: 1500.0000 [IU] | INTRAMUSCULAR | Status: DC | PRN

## 2023-12-13 MED ORDER — PREDNISONE 5 MG PO TABS: 5.0000 mg | ORAL_TABLET | Freq: Every day | ORAL | Status: DC

## 2023-12-13 MED ORDER — PENTAFLUOROPROP-TETRAFLUOROETH EX AERO: 1.0000 | INHALATION_SPRAY | CUTANEOUS | Status: DC | PRN

## 2023-12-13 MED ORDER — ALBUMIN HUMAN 25 % IV SOLN
INTRAVENOUS | Status: AC
  Administered 2023-12-13: 12.5 g
  Filled 2023-12-13: qty 100

## 2023-12-13 MED ORDER — HEPARIN SODIUM (PORCINE) 1000 UNIT/ML DIALYSIS
3500.0000 [IU] | Freq: Once | INTRAMUSCULAR | Status: AC
  Administered 2023-12-13: 3500 [IU] via INTRAVENOUS_CENTRAL

## 2023-12-13 MED ORDER — HEPARIN SODIUM (PORCINE) 1000 UNIT/ML IJ SOLN
INTRAMUSCULAR | Status: AC
  Filled 2023-12-13: qty 10

## 2023-12-13 MED ORDER — PREDNISONE 10 MG PO TABS: 10.0000 mg | ORAL_TABLET | Freq: Every day | ORAL | Status: DC

## 2023-12-13 MED ORDER — ALTEPLASE 2 MG IJ SOLR: 2.0000 mg | Freq: Once | INTRAMUSCULAR | Status: DC | PRN

## 2023-12-13 MED ORDER — HEPARIN SODIUM (PORCINE) 1000 UNIT/ML DIALYSIS
1000.0000 [IU] | INTRAMUSCULAR | Status: DC | PRN
  Administered 2023-12-13: 2400 [IU]

## 2023-12-13 NOTE — Progress Notes (Signed)
   12/13/23 1235  Vitals  Temp 97.9 F (36.6 C)  Pulse Rate 91  Resp 16  BP (!) 108/46  SpO2 97 %  O2 Device Nasal Cannula  Weight 80.1 kg  Type of Weight Post-Dialysis  Oxygen Therapy  O2 Flow Rate (L/min) 2 L/min  Patient Activity (if Appropriate) In bed  Pulse Oximetry Type Continuous  Oximetry Probe Site Changed No  Post Treatment  Dialyzer Clearance Lightly streaked  Hemodialysis Intake (mL) 0 mL  Liters Processed 64  Fluid Removed (mL) 2500 mL  Tolerated HD Treatment Yes   Pt tx done at bedside--15m13 Alert and oriented.  Informed consent signed and in chart.   TX duration:3.0  Patient tolerated well.  Alert, without acute distress.  Hand-off given to patient's nurse.   Access used: RIJTLC--temp Access issues: no complications  Total UF removed: 2500 Medication(s) given: Albumin  25% iv x 1 at the onset of the tx [facility]

## 2023-12-13 NOTE — Plan of Care (Signed)
  Problem: Education: Goal: Ability to describe self-care measures that may prevent or decrease complications (Diabetes Survival Skills Education) will improve Outcome: Progressing Goal: Individualized Educational Video(s) Outcome: Progressing   Problem: Coping: Goal: Ability to adjust to condition or change in health will improve Outcome: Progressing   Problem: Fluid Volume: Goal: Ability to maintain a balanced intake and output will improve Outcome: Progressing   Problem: Health Behavior/Discharge Planning: Goal: Ability to identify and utilize available resources and services will improve Outcome: Progressing Goal: Ability to manage health-related needs will improve Outcome: Progressing   Problem: Metabolic: Goal: Ability to maintain appropriate glucose levels will improve Outcome: Progressing   Problem: Nutritional: Goal: Maintenance of adequate nutrition will improve Outcome: Progressing Goal: Progress toward achieving an optimal weight will improve Outcome: Progressing   Problem: Skin Integrity: Goal: Risk for impaired skin integrity will decrease Outcome: Progressing   Problem: Education: Goal: Knowledge of General Education information will improve Description: Including pain rating scale, medication(s)/side effects and non-pharmacologic comfort measures Outcome: Progressing   Problem: Clinical Measurements: Goal: Ability to maintain clinical measurements within normal limits will improve Outcome: Progressing Goal: Will remain free from infection Outcome: Progressing Goal: Diagnostic test results will improve Outcome: Progressing Goal: Respiratory complications will improve Outcome: Progressing Goal: Cardiovascular complication will be avoided Outcome: Progressing   Problem: Nutrition: Goal: Adequate nutrition will be maintained Outcome: Progressing   Problem: Safety: Goal: Ability to remain free from injury will improve Outcome: Progressing

## 2023-12-13 NOTE — Progress Notes (Signed)
 PT Cancellation Note  Patient Details Name: Morgan [name] MRN: [id number] DOB: 03/07/1970   Cancelled Treatment:    Reason Eval/Treat Not Completed: Patient at procedure or test/unavailable (HD)   Irelyn Perfecto B Davinia Riccardi 12/13/2023, 10:17 AM Lenoard SQUIBB, PT Acute Rehabilitation Services Office: [phone]

## 2023-12-13 NOTE — H&P (Addendum)
 NAME:  AKARI DEFELICE, MRN:  [id number], DOB:  1970-05-21, LOS: 2 ADMISSION DATE:  12/11/2023, CONSULTATION DATE:  12/13/23 REFERRING MD:  EDP, CHIEF COMPLAINT:   shortness of breaht  History of Present Illness:  Remi Rester is a 53 y.o. 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  EMS was called and the patient required 15L non-breather en route.  In the ED, she was tachycardic and tachypneic and coughing up pink sputum and was placed on bipap.  CXR with R>L severe bilateral infiltrates vs pulmonary edema.  Labs significant for a troponin of 105,  K 3.2, creatinine 2.7 (baseline ~5.0), AST 54, ALT 90, BNP 739.   She was given Lasix  60mg  and PCCM consulted for admission  Pertinent  Medical History   has a past medical history of Acute respiratory failure with hypoxia (HCC) (10/09/2023), Anemia, CKD (chronic kidney disease) stage V requiring chronic dialysis (HCC) (05/08/2012), Constipation, Coronary artery disease, CVA (cerebral vascular accident) (HCC) (05/12/2021), Diabetes mellitus without complication (HCC), Diabetic retinopathy (HCC), ESRD on hemodialysis (HCC) (06/05/2014), H/O detached retina repair, History of CVA (cerebrovascular accident), Hyperlipidemia, Hypertension, Insulin  overdose (05/08/2012), Kidney transplanted, Pneumonia, PVD (peripheral vascular disease), S/P drug eluting coronary stent placement (08/24/2021), and Suicide attempt (HCC) (05/08/2012).   Significant Hospital Events: Including procedures, antibiotic start and stop dates in addition to other pertinent events   10/15 admit with pulmonary edema on bipap  HD 10/16 and 10/17  Interim History / Subjective:  Remained in BIPAP at night On 2  L [place] this morning No complains, concerned she has mold at home  Objective    Blood pressure (!) 111/55, pulse 91, temperature (!) 97.5 F (36.4 C), resp. rate 18, height 5' 2 (1.575 m), weight 82.6 kg, SpO2 100%.    Vent Mode: PCV;BIPAP FiO2 (%):  [50 %] 50 % Set Rate:  [15 bmp] 15 bmp PEEP:  [5 cmH20] 5 cmH20 Pressure Support:  [5 cmH20] 5 cmH20   Intake/Output Summary (Last 24 hours) at 12/13/2023 1105 Last data filed at 12/13/2023 0500 Gross per 24 hour  Intake 63.63 ml  Output 700 ml  Net -636.37 ml   Filed Weights   12/12/23 0500 12/13/23 0500 12/13/23 0856  Weight: 82.4 kg 82.6 kg 82.6 kg    General: obese, chronically ill appearing female  HEENT: MM pink/moist, sclera anicteric Neuro: interactive, nods appropriately, non-focal CV: regular rate and rhythm, normal s1s2, no m/r/g PULM:  breathing comfortably on [place] GI: soft, non-tender, non-distended Extremities: warm/dry, 2-3 pitting edema from feet to flank Skin: no rashes or lesions  Labs and imaging in chart reviewed  Resolved problem list   Assessment and Plan   Acute respiratory failure from volume overload state and ? CAP Hx of renal transplant in 2018- on cellcept , tacrolimus , 5 mg predisone at home- failed transplant' URI symptoms at home for a week Shock- likely septic shock- resolved Chronic anemia  - wean off oxygen - d/c HD catheter - switch antibiotics to oral form - wean down steroids - tacrolimus  and cellcept  as per nephrology Transfer out of ICU today  Reports allergies at home, will benefit from antihistaminics and atrovent nasal spray      Labs   CBC: Recent Labs  Lab 12/11/23 1641 12/11/23 1646  12/12/23 0623 12/13/23 0452  WBC 8.6  --  11.3* 4.2  NEUTROABS 7.3  --   --   --   HGB 10.0* 10.5* 9.1* 7.4*  HCT 32.9* 31.0* 29.7* 23.9*  MCV 90.6  --  89.5 87.9  PLT 282  --  273 220    Basic Metabolic Panel: Recent Labs  Lab 12/11/23 1641 12/11/23 1646 12/12/23 0623  12/13/23 0452  NA 143 144 141 141  K 3.2* 3.2* 4.3 4.2  CL 102 101 102 101  CO2 27  --  23 24  GLUCOSE 104* 102* 132* 171*  BUN 25* 25* 33* 47*  CREATININE 2.70* 2.70* 3.64* 4.82*  CALCIUM  8.2*  --  8.7* 9.2  MG  --   --  1.8 1.8   GFR: Estimated Creatinine Clearance: 13.3 mL/min (A) (by C-G formula based on SCr of 4.82 mg/dL (H)). Recent Labs  Lab 12/11/23 1641 12/11/23 1833 12/12/23 0623 12/13/23 0452  WBC 8.6  --  11.3* 4.2  LATICACIDVEN  --  0.9  --   --     Liver Function Tests: Recent Labs  Lab 12/11/23 1641  AST 54*  ALT 90*  ALKPHOS 245*  BILITOT 1.3*  PROT 6.2*  ALBUMIN  3.0*   No results for input(s): LIPASE, AMYLASE in the last 168 hours. No results for input(s): AMMONIA in the last 168 hours.  ABG    Component Value Date/Time   HCO3 22.7 12/12/2023 1251   TCO2 28 12/11/2023 1646   ACIDBASEDEF 1.3 12/12/2023 1251   O2SAT 95.9 12/12/2023 1251     Coagulation Profile: Recent Labs  Lab 12/11/23 1641  INR 1.1    Cardiac Enzymes: No results for input(s): CKTOTAL, CKMB, CKMBINDEX, TROPONINI in the last 168 hours.  HbA1C: Hgb A1c MFr Bld  Date/Time Value Ref Range Status  10/20/2023 09:08 AM 8.6 (H) 4.8 - 5.6 % Final    Comment:    (NOTE) Diagnosis of Diabetes The following HbA1c ranges recommended by the American Diabetes Association (ADA) may be used as an aid in the diagnosis of diabetes mellitus.  Hemoglobin             Suggested A1C NGSP%              Diagnosis  <5.7                   Non Diabetic  5.7-6.4                Pre-Diabetic  >6.4                   Diabetic  <7.0                   Glycemic control for                       adults with diabetes.    09/09/2021 02:51 AM 12.4 (H) 4.8 - 5.6 % Final    Comment:    (NOTE) Pre diabetes:          5.7%-6.4%  Diabetes:              >6.4%  Glycemic control for   <7.0% adults with diabetes     CBG: Recent Labs  Lab 12/12/23 1544 12/12/23 2005  12/12/23 2345 12/13/23 0359 12/13/23 0738  GLUCAP 169* 190* 163* 154* 149*    Review of Systems:   Negative except mentioned in interim hx  Past Medical History:  She,  has a past medical history of Acute respiratory failure with hypoxia (HCC) (10/09/2023), Anemia, CKD (chronic kidney disease) stage V requiring chronic dialysis (HCC) (05/08/2012), Constipation, Coronary artery disease, CVA (cerebral vascular accident) (HCC) (05/12/2021), Diabetes mellitus without complication (HCC), Diabetic retinopathy (HCC), ESRD on hemodialysis (HCC) (06/05/2014), H/O detached retina repair, History of CVA (cerebrovascular accident), Hyperlipidemia, Hypertension, Insulin  overdose (05/08/2012), Kidney transplanted, Pneumonia, PVD (peripheral vascular disease), S/P drug eluting coronary stent placement (08/24/2021), and Suicide attempt (HCC) (05/08/2012).   Surgical History:   Past Surgical History:  Procedure Laterality Date   ARTERIOVENOUS GRAFT PLACEMENT Left 07/28/2010   AV FISTULA PLACEMENT Right 11/25/2015   Procedure: INSERTION OF ARTERIOVENOUS (AV) GORE-TEX GRAFT RIGHT  ARM USING 4-7 MM X 45 CM STRETCH GRAFT.;  Surgeon: Lonni GORMAN Blade, MD;  Location[facility] OR;  Service: Vascular;  Laterality: Right;   basilic vein transposition Left 02/26/2009   CARDIAC CATHETERIZATION     CESAREAN SECTION     CHOLECYSTECTOMY N/A 10/08/2014   Procedure: LAPAROSCOPIC CHOLECYSTECTOMY ;  Surgeon: Debby Shipper, MD;  Location: [facility] OR;  Service: General;  Laterality: N/A;   EYE SURGERY     retinal detachment   KIDNEY TRANSPLANT  04/28/2016   REVISION OF ARTERIOVENOUS GORETEX GRAFT Left 05/13/2015   Procedure: REVISION OF ARTERIOVENOUS GORETEX GRAFT;  Surgeon: Lonni GORMAN Blade, MD;  Location: [facility] OR;  Service: Vascular;  Laterality: Left;     Social History:   reports that she has never smoked. She has never used smokeless tobacco. She reports that she does not drink alcohol and does not use drugs.    Family History:  Her family history includes Diabetes in her maternal grandmother; Gout in her maternal uncle.   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 Unable to tolerate any egg products.   Latex Itching   Nsaids Other (See Comments)    Hx kidney transplant   Tylenol  [Acetaminophen ] Other (See Comments)    Patient states she can not take due to kidney   Lanthanum Itching   Shellfish Allergy Other (See Comments)    Hx kidney transplant - told not to consume   Lactose Nausea Only   Misc. Sulfonamide Containing Compounds Itching and Other (See Comments)    Hx kidney transplant     Home Medications  Prior to Admission medications   Medication Sig Start Date End Date Taking? Authorizing Provider  amLODipine  (NORVASC ) 5 MG tablet Take 5 mg by mouth daily. 01/03/18   [provider]  aspirin  EC 81 MG tablet Take 81 mg by mouth daily. Swallow whole.    [provider]  clopidogrel  (PLAVIX ) 75 MG tablet Take 75 mg by mouth daily.    [provider]  FARXIGA  10 MG TABS tablet Take 10 mg by mouth daily. 09/08/23   [provider]  Insulin  Aspart FlexPen (NOVOLOG ) 100 UNIT/ML Inject 8-13 Units into the skin 3 (three) times daily with meals. Inject per sliding scale:  70-200    8 units 201-250  9 units 251-300  10 units 301-350  11 units 351-400  12 units 401+  13 units 10/23/21   [provider]  insulin  degludec (TRESIBA ) 200 UNIT/ML FlexTouch Pen Inject 15 Units into the skin 2 (two) times daily with a meal.    [provider]  levonorgestrel (MIRENA) 20 MCG/24HR IUD 1 each by  Intrauterine route once.    [provider]  midodrine  (PROAMATINE ) 5 MG tablet Take 1 tablet (5 mg total) by mouth 3 (three) times daily with meals. 11/23/23   Amin, Sumayya, MD  MOUNJARO 7.5 MG/0.5ML Pen Inject 7.5 mg into the skin every Tuesday. 05/13/23   [provider]  multivitamin (RENA-VIT) TABS tablet Take 1 tablet by mouth at bedtime. 11/23/23   Caleen Qualia, MD  mycophenolate  (CELLCEPT ) 250 MG capsule Take 1 capsule (250 mg total) by mouth 2 (two) times daily. 11/23/23   Caleen Qualia, MD  pantoprazole  (PROTONIX ) 40 MG tablet Take 1 tablet (40 mg total) by mouth daily. 11/24/23   Amin, Sumayya, MD  predniSONE  (DELTASONE ) 10 MG tablet Please take 50 mg / 5 tablets for next 2 days, decrease 1 tablet every 3 days until you have finished this course and then resume home prednisone  11/23/23   Amin, Sumayya, MD  predniSONE  (DELTASONE ) 5 MG tablet Take 5 mg by mouth daily.    [provider]  rosuvastatin  (CRESTOR ) 20 MG tablet Take 1 tablet (20 mg total) by mouth daily. 09/11/21 02/26/24  Elgergawy, Brayton RAMAN, MD  sodium bicarbonate  650 MG tablet Take 2 tablets (1,300 mg total) by mouth 3 (three) times daily. 11/23/23   Caleen Qualia, MD  tacrolimus  (PROGRAF ) 1 MG capsule Take 3-4 mg by mouth in the morning and at bedtime. Take 3 capsules by mouth in the morning and 4 capsules  in the evening.    [provider]  torsemide (DEMADEX) 20 MG tablet Take 20 mg by mouth daily. 10/22/23   [provider]     Critical care time: 30 minutes    The patient is critically ill with multiple organ system failure and requires high complexity decision making for assessment and support, frequent evaluation and titration of therapies, advanced monitoring, review of radiographic studies and interpretation of complex data.   Critical Care Time devoted to patient care services, exclusive of separately billable procedures, described in this note is 31 minutes.  Shatyra Becka Pleas,  MD [facility] 12/13/23 11:05 AM  Please see [email or web address] for pager details.  From 7A-7P if no response, please call [phone] After hours, please call ELink [phone]

## 2023-12-13 NOTE — Evaluation (Signed)
 Physical Therapy Evaluation Patient Details Name: Morgan [name] MRN: [id number] DOB: 09/09/1970 Today's Date: 12/13/2023  History of Present Illness  53 y/o F adm 12/11/23 with respiratory distress and hemoptysis from HD. PMHx: ESRD on HD MWF, renal transplant 2018, HTN, HLD anemia of chronic illness, DM-2, CAD, CHF, chronic metabolic acidosis, CVA  Clinical Impression  Pt pleasant and eager to get OOB. Pt with inconsistent pleth throughout due to nail polish without SOB and SPO2 >90% when presenting with accurate wave form. Pt with edema LB limiting mobility at home and with decreased edema able to mobilize with less assist. Pt needing cues for safety and sequence as well as RW. Pt with decreased strength, transfers, gait and function who will benefit from acute therapy to maximize independence. OPPT recommended.         If plan is discharge home, recommend the following: A little help with walking and/or transfers;A little help with bathing/dressing/bathroom;Assistance with cooking/housework;Assistance with feeding;Direct supervision/assist for medications management;Direct supervision/assist for financial management;Assist for transportation;Help with stairs or ramp for entrance;Supervision due to cognitive status   Can travel by private vehicle        Equipment Recommendations None recommended by PT  Recommendations for Other Services       Functional Status Assessment Patient has had a recent decline in their functional status and demonstrates the ability to make significant improvements in function in a reasonable and predictable amount of time.     Precautions / Restrictions Precautions Precautions: Fall      Mobility  Bed Mobility Overal bed mobility: Needs Assistance Bed Mobility: Supine to Sit           General bed mobility comments: supervision for safety and assist for lines    Transfers Overall transfer level: Needs assistance   Transfers: Sit  to/from Stand Sit to Stand: Contact guard assist           General transfer comment: cues for hand placement, safety and sequence    Ambulation/Gait Ambulation/Gait assistance: Contact guard assist Gait Distance (Feet): 150 Feet Assistive device: Rolling walker (2 wheels) Gait Pattern/deviations: Step-through pattern, Decreased stride length, Trunk flexed   Gait velocity interpretation: 1.31 - 2.62 ft/sec, indicative of limited community ambulator   General Gait Details: cues for posture, proximity to RW and direction  Stairs            Wheelchair Mobility     Tilt Bed    Modified Rankin (Stroke Patients Only)       Balance Overall balance assessment: Mild deficits observed, not formally tested                                           Pertinent Vitals/Pain Pain Assessment Pain Assessment: No/denies pain    Home Living Family/patient expects to be discharged to:: Private residence Living Arrangements: Spouse/significant other Available Help at Discharge: Family;Available PRN/intermittently Type of Home: House Home Access: Stairs to enter Entrance Stairs-Rails: None Entrance Stairs-Number of Steps: 3   Home Layout: Two level;Able to live on main level with bedroom/bathroom Home Equipment: Rolling Walker (2 wheels);Grab bars - tub/shower;Shower seat      Prior Function Prior Level of Function : Independent/Modified Independent;Driving;Working/employed             Mobility Comments: last 2 weeks needing assist to get in bed, shower and using RW, was using cane PRN before that  ADLs Comments: mod I, only showers when her husband is home for safety     Extremity/Trunk Assessment   Upper Extremity Assessment Upper Extremity Assessment: Generalized weakness    Lower Extremity Assessment Lower Extremity Assessment: Generalized weakness    Cervical / Trunk Assessment Cervical / Trunk Assessment: Normal  Communication    Communication Communication: No apparent difficulties    Cognition Arousal: Alert Behavior During Therapy: WFL for tasks assessed/performed   PT - Cognitive impairments: Safety/Judgement, Memory                       PT - Cognition Comments: pt with repetitive statements at times and slightly tangential, pleasant and answering questions appropriately Following commands: Intact       Cueing Cueing Techniques: Verbal cues     General Comments      Exercises     Assessment/Plan    PT Assessment Patient needs continued PT services  PT Problem List Decreased strength;Decreased activity tolerance;Decreased balance;Decreased mobility;Decreased coordination;Decreased cognition;Decreased safety awareness;Decreased knowledge of use of DME       PT Treatment Interventions DME instruction;Gait training;Stair training;Functional mobility training;Therapeutic activities;Therapeutic exercise;Balance training;Cognitive remediation;Patient/family education    PT Goals (Current goals can be found in the Care Plan section)  Acute Rehab PT Goals Patient Stated Goal: 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 PT Goal Formulation: With patient Time For Goal Achievement: 12/27/23 Potential to Achieve Goals: Good    Frequency Min 2X/week     Co-evaluation               AM-PAC PT 6 Clicks Mobility  Outcome Measure Help needed turning from your back to your side while in a flat bed without using bedrails?: A Little Help needed moving from lying on your back to sitting on the side of a flat bed without using bedrails?: A Little Help needed moving to and from a bed to a chair (including a wheelchair)?: A Little Help needed standing up from a chair using your arms (e.g., wheelchair or bedside chair)?: A Little Help needed to walk in hospital room?: A Little Help needed climbing 3-5 steps with a railing? : A Lot 6 Click Score: 17    End of Session   Activity Tolerance: Patient  tolerated treatment well Patient left: in chair;with call bell/phone within reach;with chair alarm set Nurse Communication: Mobility status PT Visit Diagnosis: Unsteadiness on feet (R26.81);Other abnormalities of gait and mobility (R26.89);Muscle weakness (generalized) (M62.81)    Time: 8694-8663 PT Time Calculation (min) (ACUTE ONLY): 31 min   Charges:   PT Evaluation $PT Eval Moderate Complexity: 1 Mod PT Treatments $Gait Training: 8-22 mins PT General Charges $$ ACUTE PT VISIT: 1 Visit         Lenoard SQUIBB, PT Acute Rehabilitation Services Office: [phone]   Meriam Chojnowski B Trevar Boehringer 12/13/2023, 2:02 PM

## 2023-12-13 NOTE — Progress Notes (Signed)
 [facility] Progress Note  Subjective:  Seen in ICU For iHD today UOP yest 1000 cc, today 700 cc so far Wts stable at 82.6kg   Vitals:   12/13/23 0945 12/13/23 1000 12/13/23 1015 12/13/23 1030  BP: (!) 122/54 (!) 124/54 (!) 119/47 (!) 108/45  Pulse: 94 93 90 91  Resp: 20 19 10 14   Temp:      TempSrc:      SpO2: 100% 98% 100% 95%  Weight:      Height:        Exam: General: chronically ill-appearing, alert 2 L [place] earlier, on RA now  CV: RRR, no RG Lungs: coarse BS, no rales/ wheezing, wob ok Abd: soft, non-tender, non-distended, obese Ext: 1+ bilat LE edema Neuro: alert, OX3   RUA AVG+bruit / temp RIJ HD cath  Home bp meds: Norvasc  5mg  every day (paused) Midodrine  5mg  tid Demadex 20mg  every day (paused)   OP HD: MWF NW 2.5h (1st session)  B250  87.5kg  3K  AVG  Heparin  4000 1st/ last OP HD was 10/15, post wt 86kg No meds yet CXR 10/15- diffuse pulm edema CXR 10/16 - 50% better bilat pulm edema   Assessment/ Plan: Pulm edema/ resp distress: looks better today, wob better and off bipap. Cont IV Lasix  80 bid as still making good amts of urine. CXR 10/16 showed improvement in pulm edema. HD today, max UF as tolerated.   Sepsis: temp ^ in ICU, getting IV abx for suspected infection ESRD: recently returned to HD after 8 yrs of renal transplant. Had HD here Wed night. Next HD today.  Volume: minimal pitting edema, 5 kg under dry wt.  Hypotension: chronic issue, cont midodrine  5 mg tid, can ^ as needed. SBP's 95-125 today.  Vascular access: temp cath R internal jugular placed in case needs CRRT. R AVG working well.  H/o renal transplant: 2018 to now. Holding cellcept  short-term due to potential infection. Also we decreased tacrolimus  to 2mg  BID. Anemia esrd: Hemoglobin at goal without ESA.  Secondary Hyperparathyroidism: Not on any outpatient medications.     Myer Fret MD  [facility] 12/13/2023, 10:50 AM  Recent Labs  Lab 12/11/23 1641 12/11/23 1646  12/12/23 0623 12/13/23 0452  HGB 10.0*   < > 9.1* 7.4*  ALBUMIN  3.0*  --   --   --   CALCIUM  8.2*  --  8.7* 9.2  CREATININE 2.70*   < > 3.64* 4.82*  K 3.2*   < > 4.3 4.2   < > = values in this interval not displayed.   No results for input(s): IRON, TIBC, FERRITIN in the last 168 hours. Inpatient medications:  aspirin  EC  81 mg Oral Daily   azithromycin   500 mg Oral Daily   [START ON 12/14/2023] cefUROXime   500 mg Oral Q2000   Chlorhexidine  Gluconate Cloth  6 each Topical Daily   Chlorhexidine  Gluconate Cloth  6 each Topical Q0600   Chlorhexidine  Gluconate Cloth  6 each Topical Q0600   furosemide   80 mg Intravenous Q12H   heparin   5,000 Units Subcutaneous Q8H   hydrocortisone  sod succinate (SOLU-CORTEF ) inj  100 mg Intravenous Daily   Followed by   NOREEN ON 12/14/2023] predniSONE   20 mg Oral Q breakfast   Followed by   NOREEN ON 12/15/2023] predniSONE   10 mg Oral Q breakfast   Followed by   NOREEN ON 12/16/2023] predniSONE   5 mg Oral Q breakfast   insulin  aspart  0-6 Units Subcutaneous Q4H  midodrine   5 mg Oral TID WC   pantoprazole  (PROTONIX ) IV  40 mg Intravenous Q24H   tacrolimus   2 mg Oral BID    albumin  human     anticoagulant sodium citrate     cefTRIAXone  (ROCEPHIN )  IV Stopped (12/12/23 0951)   norepinephrine  (LEVOPHED ) Adult infusion Stopped (12/13/23 0126)   acetaminophen , alteplase , anticoagulant sodium citrate, docusate sodium , heparin , [START ON 12/14/2023] heparin , lidocaine  (PF), lidocaine -prilocaine , ondansetron  (ZOFRAN ) IV, pentafluoroprop-tetrafluoroeth, polyethylene glycol

## 2023-12-14 ENCOUNTER — Other Ambulatory Visit (HOSPITAL_COMMUNITY): Payer: Self-pay

## 2023-12-14 DIAGNOSIS — J9601 Acute respiratory failure with hypoxia: Secondary | ICD-10-CM

## 2023-12-14 LAB — BASIC METABOLIC PANEL WITH GFR
Anion gap: 19 — ABNORMAL HIGH (ref 5–15)
BUN: 47 mg/dL — ABNORMAL HIGH (ref 6–20)
CO2: 23 mmol/L (ref 22–32)
Calcium: 9.3 mg/dL (ref 8.9–10.3)
Chloride: 94 mmol/L — ABNORMAL LOW (ref 98–111)
Creatinine, Ser: 4.28 mg/dL — ABNORMAL HIGH (ref 0.44–1.00)
GFR, Estimated: 12 mL/min — ABNORMAL LOW (ref >60)
Glucose, Bld: 229 mg/dL — ABNORMAL HIGH (ref 70–99)
Potassium: 4.3 mmol/L (ref 3.5–5.1)
Sodium: 136 mmol/L (ref 135–145)

## 2023-12-14 LAB — CBC
HCT: 30.8 % — ABNORMAL LOW (ref 36.0–46.0)
Hemoglobin: 9.2 g/dL — ABNORMAL LOW (ref 12.0–15.0)
MCH: 26.6 pg (ref 26.0–34.0)
MCHC: 29.9 g/dL — ABNORMAL LOW (ref 30.0–36.0)
MCV: 89 fL (ref 80.0–100.0)
Platelets: 311 K/uL (ref 150–400)
RBC: 3.46 MIL/uL — ABNORMAL LOW (ref 3.87–5.11)
RDW: 15 % (ref 11.5–15.5)
WBC: 6.1 K/uL (ref 4.0–10.5)
nRBC: 0.5 % — ABNORMAL HIGH (ref 0.0–0.2)

## 2023-12-14 LAB — GLUCOSE, CAPILLARY
Glucose-Capillary: 228 mg/dL — ABNORMAL HIGH (ref 70–99)
Glucose-Capillary: 249 mg/dL — ABNORMAL HIGH (ref 70–99)
Glucose-Capillary: 350 mg/dL — ABNORMAL HIGH (ref 70–99)

## 2023-12-14 LAB — MAGNESIUM: Magnesium: 2.5 mg/dL — ABNORMAL HIGH (ref 1.7–2.4)

## 2023-12-14 MED ORDER — TACROLIMUS 0.5 MG PO CAPS
2.0000 mg | ORAL_CAPSULE | Freq: Two times a day (BID) | ORAL | 0 refills | Status: AC
  Filled 2023-12-14: qty 60, 8d supply, fill #0

## 2023-12-14 MED ORDER — CEFUROXIME AXETIL 500 MG PO TABS
500.0000 mg | ORAL_TABLET | Freq: Every day | ORAL | 0 refills | Status: AC
  Filled 2023-12-14: qty 3, 3d supply, fill #0

## 2023-12-14 MED ORDER — POLYETHYLENE GLYCOL 3350 17 GM/SCOOP PO POWD
17.0000 g | Freq: Every day | ORAL | 0 refills | Status: AC | PRN
  Filled 2023-12-14: qty 238, 14d supply, fill #0

## 2023-12-14 MED ORDER — CALCIUM CARBONATE ANTACID 500 MG PO CHEW: 1.0000 | CHEWABLE_TABLET | Freq: Three times a day (TID) | ORAL | Status: DC

## 2023-12-14 MED ADMIN — Pantoprazole Sodium EC Tab 40 MG (Base Equiv): 40 mg | ORAL | NDC 00904647461

## 2023-12-14 MED FILL — Pantoprazole Sodium EC Tab 40 MG (Base Equiv): 40.0000 mg | ORAL | Qty: 1 | Status: AC

## 2023-12-14 NOTE — Plan of Care (Signed)
  Problem: Fluid Volume: Goal: Ability to maintain a balanced intake and output will improve Outcome: Progressing   Problem: Metabolic: Goal: Ability to maintain appropriate glucose levels will improve Outcome: Progressing   Problem: Tissue Perfusion: Goal: Adequacy of tissue perfusion will improve Outcome: Progressing   Problem: Pain Managment: Goal: General experience of comfort will improve and/or be controlled Outcome: Progressing

## 2023-12-14 NOTE — Evaluation (Signed)
 Occupational Therapy Evaluation Patient Details Name: Morgan [name] MRN: [id number] DOB: 10-Jan-1971 Today's Date: 12/14/2023   History of Present Illness   53 y/o F adm 12/11/23 with respiratory distress and hemoptysis from HD. PMHx: ESRD on HD MWF, renal transplant 2018, HTN, HLD anemia of chronic illness, DM-2, CAD, CHF, chronic metabolic acidosis, CVA     Clinical Impressions Pt admitted based on above, and was seen based on problem list below. PTA pt was mostly ind with ADLs, husband completing IADLs d/t cog deficits. Today pt is at her functional baseline for ADLs. Pt completed LB dressing, standing ADLs, and simulated tub shower transfer with supervision to mod I. Educated pt and husband on benefits of using tub-transfer bench to complete tub transfers. 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 Pt would benefit from further acute OT services to educate regarding compensatory strategies for cognition, to regain independence.  No follow up OT needs.     If plan is discharge home, recommend the following:   A little help with walking and/or transfers;Assistance with cooking/housework     Functional Status Assessment   Patient has had a recent decline in their functional status and demonstrates the ability to make significant improvements in function in a reasonable and predictable amount of time.     Equipment Recommendations   None recommended by OT      Precautions/Restrictions   Precautions Precautions: Fall Recall of Precautions/Restrictions: Intact Restrictions Weight Bearing Restrictions Per Provider Order: No     Mobility Bed Mobility     General bed mobility comments: Received in reciner    Transfers Overall transfer level: Needs assistance Equipment used: Rolling walker (2 wheels) Transfers: Sit to/from Stand Sit to Stand: Supervision           General  transfer comment: S for safety, good hand placement with RW      Balance Overall balance assessment: Mild deficits observed, not formally tested     ADL either performed or assessed with clinical judgement   ADL Overall ADL's : At baseline       Functional mobility during ADLs: Supervision/safety General ADL Comments: At baseline for ADLs, completing LB dressing, standing ADLs, and shower transfers at mod I/supervision     Vision Baseline Vision/History: 5 Retinopathy Patient Visual Report: No change from baseline Vision Assessment?: No apparent visual deficits            Pertinent Vitals/Pain Pain Assessment Pain Assessment: No/denies pain     Extremity/Trunk Assessment Upper Extremity Assessment Upper Extremity Assessment: Generalized weakness   Lower Extremity Assessment Lower Extremity Assessment: Defer to PT evaluation   Cervical / Trunk Assessment Cervical / Trunk Assessment: Normal   Communication Communication Communication: No apparent difficulties   Cognition Arousal: Alert Behavior During Therapy: WFL for tasks assessed/performed Cognition: History of cognitive impairments       OT - Cognition Comments: Hx of brain fog, and mild executive functioning deficits from CVA     Following commands: Intact       Cueing  General Comments   Cueing Techniques: Verbal cues  VSS on RA           Home Living Family/patient expects to be discharged to:: Private residence Living Arrangements: Spouse/significant other Available Help at Discharge: Family;Available PRN/intermittently Type of Home: House Home Access: Stairs to enter [employer] of Steps: 3 Entrance Stairs-Rails: None Home Layout: Two level;Able to live on main level with bedroom/bathroom  Bathroom Shower/Tub: IT trainer: Standard Bathroom Accessibility: Yes How Accessible: Accessible via walker Home Equipment: Rolling Walker (2  wheels);Grab bars - tub/shower;Shower seat          Prior Functioning/Environment Prior Level of Function : Independent/Modified Independent;Driving;Working/employed             Mobility Comments: Reports since CVA walking with assistance from husband, cane and RW within the last 2 weeks ADLs Comments: mod I, supervision with showers, works as a Actor Problem List: Decreased strength;Decreased activity tolerance;Impaired balance (sitting and/or standing);Decreased safety awareness;Cardiopulmonary status limiting activity;Obesity   OT Treatment/Interventions: Self-care/ADL training;Therapeutic exercise;Energy conservation;DME and/or AE instruction;Therapeutic activities;Cognitive remediation/compensation;Balance training;Patient/family education      OT Goals(Current goals can be found in the care plan section)   Acute Rehab OT Goals Patient Stated Goal: To go home OT Goal Formulation: With patient Time For Goal Achievement: 12/28/23 Potential to Achieve Goals: Good   OT Frequency:  Min 1X/week       AM-PAC OT 6 Clicks Daily Activity     Outcome Measure Help from another person eating meals?: None Help from another person taking care of personal grooming?: None Help from another person toileting, which includes using toliet, bedpan, or urinal?: None Help from another person bathing (including washing, rinsing, drying)?: None Help from another person to put on and taking off regular upper body clothing?: None Help from another person to put on and taking off regular lower body clothing?: None 6 Click Score: 24   End of Session Equipment Utilized During Treatment: Rolling walker (2 wheels) Nurse Communication: Mobility status  Activity Tolerance: Patient tolerated treatment well Patient left: in chair;with call bell/phone within reach;with family/visitor present  OT Visit Diagnosis: Unsteadiness on feet (R26.81);Other abnormalities of gait and  mobility (R26.89);Muscle weakness (generalized) (M62.81)                Time: 9049-8984 OT Time Calculation (min): 25 min Charges:  OT General Charges $OT Visit: 1 Visit OT Evaluation $OT Eval Low Complexity: 1 Low  Adrianne BROCKS, OT  Acute Rehabilitation Services Office [phone] Secure chat preferred   Adrianne GORMAN Savers 12/14/2023, 10:28 AM

## 2023-12-14 NOTE — Discharge Summary (Signed)
 Physician Discharge Summary   Patient: Morgan [name] MRN: [id number] DOB: [birth year]  Admit date:     12/11/2023  Discharge date: 12/14/23  Discharge Physician: Nena Rebel   PCP: Orvis Glendia Ditch, MD   Recommendations at discharge:   Continue hemodialysis as scheduled by nephrology Follow-up with nephrology as scheduled Nephrologist advised to continue Norvasc , Demadex, midodrine , prednisone  5 mg, CellCept , decrease Prograf  to 2 mg twice daily.  Discharge Diagnoses: Principal Problem:   Acute hypoxic respiratory failure (HCC) Active Problems:   ESRD (end stage renal disease) (HCC)  Resolved Problems:   * No resolved hospital problems. [facility] Course: Morgan [name] is a 53 y.o. 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  EMS was called and the patient required 15L non-breather en route.   In the ED, she was tachycardic and tachypneic and coughing up pink sputum and was placed on bipap.  CXR with R>L severe bilateral infiltrates vs pulmonary edema.  Labs significant for a troponin of 105,  K 3.2, creatinine 2.7 (baseline ~5.0), AST 54, ALT 90, BNP 739.   She was given Lasix  60mg  and PCCM consulted for admission.  Patient was admitted to ICU, dialyzed.  She felt better she was transferred out of ICU to the medical floor.  Discussed with Dr.Schert, on-call nephrologist who advised that patient is clinically okay for discharge.  Patient was seen and examined at bedside.  She feels better and she expressed wishes to go home.  Husband was at bedside as well.  Patient will be discharged home 12/14/2023.  Assessment and Plan:  Acute hypoxemic respiratory failure from volume overload state and likely  community-acquired pneumonia.  Patient hide respiratory panel negative, sputum and blood cultures did not grow bacteria.  Patient was treated with ceftriaxone  and azithromycin , urgent hemodialysis. Hx of renal transplant in 2018- on cellcept , tacrolimus , 5 mg predisone at home- failed transplant' URI symptoms at home for a week Shock- likely septic shock- resolved Chronic anemia   - weaned off oxygen - d/c HD catheter - switch antibiotics to oral form for 3 days - wean down steroids to home dose 5 mg daily - tacrolimus  and cellcept  as per nephrology recommendation -Nephrologist advised to continue Demadex, Norvasc , midodrine .   Reports allergies at home, will benefit from antihistaminics and atrovent nasal spray       Pain control - [place]  Controlled Substance Reporting System database was reviewed. and patient was instructed, not to drive, operate heavy machinery, perform activities at heights, swimming or participation in water  activities or provide baby-sitting services while on Pain, Sleep and Anxiety Medications; until their outpatient Physician has advised to do so again. Also recommended to not to take more than prescribed Pain, Sleep and Anxiety Medications.  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 Unable to tolerate any egg products.  Latex Itching   Nsaids Other (See Comments)   Hx kidney transplant   Tylenol  [acetaminophen ] Other (See Comments)   Patient states she can not take due  to kidney   Lanthanum Itching   Shellfish Allergy Other (See Comments)   Hx kidney transplant - told not to consume   Lactose Nausea Only   Misc. Sulfonamide Containing Compounds Itching, Other (See Comments)   Hx kidney transplant        Medication List     TAKE these medications    amLODipine  5 MG tablet Commonly known as: NORVASC  Take 5 mg by mouth daily.   aspirin  EC 81 MG tablet Take 81 mg by mouth daily. Swallow whole.   cefUROXime  500 MG tablet Commonly known as: CEFTIN  Take 1 tablet (500 mg total) by mouth daily at 8 pm.   clopidogrel  75 MG tablet Commonly known as: PLAVIX  Take 75 mg by mouth daily.   Farxiga  10 MG Tabs tablet Generic drug: dapagliflozin  propanediol Take 10 mg by mouth daily.   Insulin  Aspart FlexPen 100 UNIT/ML Commonly known as: NOVOLOG  Inject 8-13 Units into the skin 3 (three) times daily with meals. Inject per sliding scale:  70-200    8 units 201-250  9 units 251-300  10 units 301-350  11 units 351-400  12 units 401+       13 units   insulin  degludec 200 UNIT/ML FlexTouch Pen Commonly known as: TRESIBA  Inject 15 Units into the skin 2 (two) times daily with a meal.   levonorgestrel 20 MCG/24HR IUD Commonly known as: MIRENA 1 each by Intrauterine route once.   lidocaine -prilocaine  cream Commonly known as: EMLA  SMARTSIG:1 Topical Daily   midodrine  5 MG tablet Commonly known as: PROAMATINE  Take 1 tablet (5 mg total) by mouth 3 (three) times daily with meals.   Mounjaro 7.5 MG/0.5ML Pen Generic drug: tirzepatide Inject 7.5 mg into the skin every Tuesday.   multivitamin Tabs tablet Take 1 tablet by mouth at bedtime.   mycophenolate  250 MG capsule Commonly known as: CELLCEPT  Take 1 capsule (250 mg total) by mouth 2 (two) times daily.   pantoprazole  40 MG tablet Commonly known as: PROTONIX  Take 1 tablet (40 mg total) by mouth daily.   polyethylene glycol powder 17 GM/SCOOP powder Commonly known as:  GLYCOLAX /MIRALAX  Take 17 g by mouth daily as needed for moderate constipation. Dissolve 1 capful (17g) in 4-8 ounces of liquid and take by mouth daily.   predniSONE  5 MG tablet Commonly known as: DELTASONE  Take 5 mg by mouth daily. What changed: Another medication with the same name was removed. Continue taking this medication, and follow the directions you see here.   rosuvastatin  20 MG tablet Commonly known as: CRESTOR  Take 1 tablet (20 mg total) by mouth daily.   sodium bicarbonate  650 MG tablet Take 2 tablets (1,300 mg total) by mouth 3 (three) times daily.   tacrolimus  0.5 MG capsule Commonly known as: PROGRAF  Take 4 capsules (2 mg total) by mouth 2 (two) times daily. What changed:  medication strength how much to take when to take this additional instructions   torsemide 20 MG tablet Commonly known as: DEMADEX Take 20 mg by mouth daily.        Discharge Exam: Filed Weights   12/13/23 1235 12/14/23 0515 12/14/23 1000  Weight: 80.1 kg 81.1 kg 81.1 kg   Constitutional: Alert, awake, calm, comfortable HEENT: Neck supple Respiratory: clear to auscultation bilaterally, no wheezing, no crackles. Normal respiratory effort. No accessory muscle use.  Cardiovascular: Regular rate and rhythm, no murmurs / rubs / gallops. No extremity edema. 2+ pedal pulses. No carotid bruits.  Abdomen: no tenderness, no masses palpated. No hepatosplenomegaly. Bowel sounds positive.  Musculoskeletal: no clubbing / cyanosis. No joint deformity upper and lower extremities. Good ROM, no contractures. Normal muscle tone.  Skin: no rashes, lesions, ulcers. No induration Neurologic: CN 2-12 grossly intact. Sensation intact, DTR normal. Strength 5/5 x all 4 extremities.  Psychiatric: Normal judgment and insight. Alert and oriented x 3. Normal mood.    Condition at discharge: good  The results of significant diagnostics from this hospitalization (including imaging, microbiology, ancillary and  laboratory) are listed below for reference.   Imaging Studies: DG CHEST PORT 1 VIEW Result Date: 12/13/2023 EXAM: 1 VIEW XRAY OF THE CHEST 12/13/2023 02:37:46 PM COMPARISON: Same day. CLINICAL HISTORY: Hypoxia. Per nurse, CXR needed s/p dialysis to r/o new infiltrates. FINDINGS: LINES, TUBES AND DEVICES: Right internal jugular dialysis catheter is unchanged stable. LUNGS AND PLEURA: No focal pulmonary opacity. No pulmonary edema. No pleural effusion. No pneumothorax. HEART AND MEDIASTINUM: Stable cardiomediastinal silhouette. BONES AND SOFT TISSUES: No acute osseous abnormality. IMPRESSION: 1. No acute cardiopulmonary process. Electronically signed by: Lynwood Seip MD 12/13/2023 03:31 PM EDT RP Workstation: [id number]   DG CHEST PORT 1 VIEW Result Date: 12/13/2023 EXAM: 1 VIEW(S) XRAY OF THE CHEST 12/13/2023 08:06:00 AM COMPARISON: 12/12/2023 CLINICAL HISTORY: Hypoxia FINDINGS: LINES, TUBES AND DEVICES: Right IJ CVC in place with tip at right atrium. Left axillary vascular stents noted. LUNGS AND PLEURA: Right greater than left interstitial and ground-glass opacities. No pulmonary edema. No pleural effusion. No pneumothorax. HEART AND MEDIASTINUM: Cardiomegaly, stable. No acute abnormality of the mediastinal silhouette. BONES AND SOFT TISSUES: No acute osseous abnormality. IMPRESSION: 1. Right greater than left interstitial opacities, which may reflect edema or infection. 2. Cardiomegaly, stable. Electronically signed by: Donnice Mania MD 12/13/2023 11:38 AM EDT RP Workstation: [id number]   DG CHEST PORT 1 VIEW Result Date: 12/12/2023 CLINICAL DATA:  Hypoxia. EXAM: PORTABLE CHEST 1 VIEW COMPARISON:  12/11/2023 FINDINGS: Right jugular dialysis catheter is stable with the tip near the superior cavoatrial junction. Patchy lung densities have slightly decreased. Findings may be associated with pulmonary edema. Blunting at the right costophrenic angle may represent a small right pleural effusion. Heart size is  upper limits of normal but stable. Evidence for a vascular stent and graft in the left axillary region. Trachea is midline. IMPRESSION: 1. Slightly decreased lung densities. Findings may represent pulmonary edema. 2. Probable small right pleural effusion. Electronically Signed   By: Juliene Balder M.D.   On: 12/12/2023 10:00   DG CHEST PORT 1 VIEW Result Date: 12/11/2023 EXAM: 1 VIEW(S) XRAY OF THE CHEST 12/11/2023 08:29:59 PM COMPARISON: None available. CLINICAL HISTORY: Encounter for insertion of tunneled central venous catheter (CVC) with port. FINDINGS: LINES, TUBES AND DEVICES: Interval replacement of a right internal jugular central venous catheter with tip overlying the right atrium. Consider retracting by 2 cm. Left axillary and brachial stents noted. LUNGS AND PLEURA: Persistent patchy airspace and interstitial opacities diffusely. No definite pleural effusion. No pneumothorax. HEART AND MEDIASTINUM: No acute abnormality of the cardiac and mediastinal silhouettes. BONES AND SOFT TISSUES: No acute osseous abnormality. IMPRESSION: 1. Right internal jugular central venous catheter with tip projecting over the right atrium; consider retracting by approximately 1 cm. 2. Persistent diffuse patchy airspace and interstitial opacities. 3. No definite pleural effusion. Electronically signed by: Morgane Naveau MD 12/11/2023 08:45 PM EDT RP Workstation: [id number]  DG Chest Portable 1 View Result Date: 12/11/2023 CLINICAL DATA:  Shortness of breath. EXAM: PORTABLE CHEST 1 VIEW COMPARISON:  November 18, 2023 FINDINGS: The heart size and mediastinal contours are within normal limits. Moderate to marked severity infiltrates are seen throughout both lungs, right greater than left, with mildly aerated lung noted within the left upper lobe. No pleural effusion or pneumothorax is identified. The visualized skeletal structures are unremarkable. IMPRESSION: Moderate to marked severity bilateral infiltrates, right greater  than left. Sequelae associated with severe pulmonary edema cannot be excluded. Electronically Signed   By: Suzen Dials M.D.   On: 12/11/2023 16:47   US  Renal Transplant w/Doppler Result Date: 11/19/2023 CLINICAL DATA:  Acute kidney injury and history of prior renal transplantation. EXAM: ULTRASOUND OF RENAL TRANSPLANT WITH RENAL DOPPLER ULTRASOUND TECHNIQUE: Ultrasound examination of the renal transplant was performed with gray-scale, color and duplex doppler evaluation. COMPARISON:  None Available. FINDINGS: Transplant kidney location: RLQ Transplant Kidney: Renal measurements: 9.6 x 6.8 x 5.4 cm = volume: . Normal in size and parenchymal echogenicity. No evidence of mass or hydronephrosis. No peri-transplant fluid collection seen. Color flow in the main renal artery:  Yes Color flow in the main renal vein:  Yes Duplex Doppler Evaluation: Main Renal Artery Velocity: 53 cm/sec Main Renal Artery Resistive Index: 0.85 Venous waveform in main renal vein:  Present Intrarenal resistive index in upper pole:  0.87 (normal 0.6-0.8; equivocal 0.8-0.9; abnormal >= 0.9) Intrarenal resistive index in lower pole: 0.77 (normal 0.6-0.8; equivocal 0.8-0.9; abnormal >= 0.9) Bladder: Decompressed by Foley catheter. Other findings:  None. IMPRESSION: 1. No evidence of transplant hydronephrosis or perinephric fluid collection. 2. Equivocal resistive indices in the main renal artery and intrarenal arteries. Electronically Signed   By: Marcey Moan M.D.   On: 11/19/2023 11:13   VAS US  LOWER EXTREMITY VENOUS (DVT) Result Date: 11/18/2023  Lower Venous DVT Study Patient Name:  ORLENE SALMONS  Date of Exam:   11/18/2023 Medical Rec #: [id number]                 Accession #:    [id number] Date of Birth: Jul 06, 1970                 Patient Gender: F Patient Age:   85 years Exam Location:  [facility] Procedure:      VAS US  LOWER EXTREMITY VENOUS (DVT) Referring Phys: MAUDE BANNER  --------------------------------------------------------------------------------  Indications: Acute respiratory failure & elevated D-dimer (0.75).  Comparison Study: No previous exams Performing Technologist: Jody Hill RVT, RDMS  Examination Guidelines: A complete evaluation includes B-mode imaging, spectral Doppler, color Doppler, and power Doppler as needed of all accessible portions of each vessel. Bilateral testing is considered an integral part of a complete examination. Limited examinations for reoccurring indications may be performed as noted. The reflux portion of the exam is performed with the patient in reverse Trendelenburg.  +--------+---------------+---------+-----------+----------+--------------------+ RIGHT   CompressibilityPhasicitySpontaneityPropertiesThrombus Aging       +--------+---------------+---------+-----------+----------+--------------------+ CFV     Full           Yes      Yes                                       +--------+---------------+---------+-----------+----------+--------------------+ SFJ     Full                                                              +--------+---------------+---------+-----------+----------+--------------------+  FV Prox Full           Yes      Yes                                       +--------+---------------+---------+-----------+----------+--------------------+ FV Mid  Full           No       Yes        pulsatile                      +--------+---------------+---------+-----------+----------+--------------------+ FV                     Yes      Yes                  patent by            Distal                                               color/doppler        +--------+---------------+---------+-----------+----------+--------------------+ PFV     Full                                                              +--------+---------------+---------+-----------+----------+--------------------+ POP      Full           Yes      Yes                                       +--------+---------------+---------+-----------+----------+--------------------+ PTV     Full                                                              +--------+---------------+---------+-----------+----------+--------------------+ PERO    Full                                                              +--------+---------------+---------+-----------+----------+--------------------+ Patient unable to tolerate compressions of distal fv  +---------+---------------+---------+-----------+----------+--------------+ LEFT     CompressibilityPhasicitySpontaneityPropertiesThrombus Aging +---------+---------------+---------+-----------+----------+--------------+ CFV      Full           No       Yes        pulsatile                +---------+---------------+---------+-----------+----------+--------------+ SFJ      Full                                                        +---------+---------------+---------+-----------+----------+--------------+  FV Prox  Full           No       Yes        pulsatile                +---------+---------------+---------+-----------+----------+--------------+ FV Mid   Full           Yes      Yes                                 +---------+---------------+---------+-----------+----------+--------------+ FV DistalFull           Yes      Yes                                 +---------+---------------+---------+-----------+----------+--------------+ PFV      Full                                                        +---------+---------------+---------+-----------+----------+--------------+ POP      Full           Yes      Yes                                 +---------+---------------+---------+-----------+----------+--------------+ PTV      Full                                                         +---------+---------------+---------+-----------+----------+--------------+ PERO     Full                                                        +---------+---------------+---------+-----------+----------+--------------+     Summary: BILATERAL: - No evidence of deep vein thrombosis seen in the lower extremities, bilaterally. -No evidence of popliteal cyst, bilaterally.   *See table(s) above for measurements and observations. Electronically signed by Debby Robertson on 11/18/2023 at 4:46:58 PM.    Final    ECHOCARDIOGRAM COMPLETE Result Date: 11/18/2023    ECHOCARDIOGRAM REPORT   Patient Name:   LUMMIE MONTIJO Date of Exam: 11/18/2023 Medical Rec #:  [id number]                Height:       62.0 in Accession #:    [id number]               Weight:       191.8 lb Date of Birth:  1970/07/26                BSA:          1.878 m Patient Age:    53 years                 BP:           110/53 mmHg Patient  Gender: F                        HR:           96 bpm. Exam Location:  Inpatient Procedure: 2D Echo, Cardiac Doppler, Color Doppler and Intracardiac            Opacification Agent (Both Spectral and Color Flow Doppler were            utilized during procedure). Indications:    Shock  History:        Patient has prior history of Echocardiogram examinations, most                 recent 10/10/2023. Signs/Symptoms:Bacteremia; Risk                 Factors:Dyslipidemia, Hypertension and Diabetes.  Sonographer:    Ellouise Mose RDCS Referring Phys: [id number] PETER E BABCOCK  Sonographer Comments: Technically difficult study due to poor echo windows and patient is obese. Image acquisition challenging due to patient body habitus. IMPRESSIONS  1. Left ventricular ejection fraction, by estimation, is 60 to 65%. The left ventricle has normal function. The left ventricle has no regional wall motion abnormalities. Left ventricular diastolic parameters were normal.  2. Right ventricular systolic function is normal. The right  ventricular size is normal. Tricuspid regurgitation signal is inadequate for assessing PA pressure.  3. Moderate pleural effusion in the left lateral region.  4. The mitral valve is normal in structure. No evidence of mitral valve regurgitation.  5. The aortic valve is normal in structure. Aortic valve regurgitation is not visualized.  6. The inferior vena cava is normal in size with greater than 50% respiratory variability, suggesting right atrial pressure of 3 mmHg. FINDINGS  Left Ventricle: Left ventricular ejection fraction, by estimation, is 60 to 65%. The left ventricle has normal function. The left ventricle has no regional wall motion abnormalities. Definity  contrast agent was given IV to delineate the left ventricular  endocardial borders. The left ventricular internal cavity size was normal in size. There is no left ventricular hypertrophy. Left ventricular diastolic parameters were normal. Right Ventricle: The right ventricular size is normal. No increase in right ventricular wall thickness. Right ventricular systolic function is normal. Tricuspid regurgitation signal is inadequate for assessing PA pressure. Left Atrium: Left atrial size was normal in size. Right Atrium: Right atrial size was normal in size. Pericardium: There is no evidence of pericardial effusion. Mitral Valve: The mitral valve is normal in structure. No evidence of mitral valve regurgitation. MV peak gradient, 9.5 mmHg. The mean mitral valve gradient is 4.0 mmHg. Tricuspid Valve: The tricuspid valve is normal in structure. Tricuspid valve regurgitation is trivial. Aortic Valve: The aortic valve is normal in structure. Aortic valve regurgitation is not visualized. Aortic valve mean gradient measures 4.0 mmHg. Aortic valve peak gradient measures 7.1 mmHg. Aortic valve area, by VTI measures 2.49 cm. Pulmonic Valve: The pulmonic valve was normal in structure. Pulmonic valve regurgitation is not visualized. Aorta: The aortic root and  ascending aorta are structurally normal, with no evidence of dilitation. Venous: The inferior vena cava is normal in size with greater than 50% respiratory variability, suggesting right atrial pressure of 3 mmHg. IAS/Shunts: No atrial level shunt detected by color flow Doppler. Additional Comments: There is a moderate pleural effusion in the left lateral region.  LEFT VENTRICLE PLAX 2D LVIDd:         4.50 cm  Diastology LVIDs:         3.40 cm      LV e' medial:    7.18 cm/s LV PW:         1.00 cm      LV E/e' medial:  20.8 LV IVS:        0.90 cm      LV e' lateral:   6.53 cm/s LVOT diam:     2.10 cm      LV E/e' lateral: 22.9 LV SV:         64 LV SV Index:   34 LVOT Area:     3.46 cm  LV Volumes (MOD) LV vol d, MOD A2C: 74.7 ml LV vol d, MOD A4C: 118.9 ml LV vol s, MOD A2C: 45.0 ml LV vol s, MOD A4C: 54.2 ml LV SV MOD A2C:     29.7 ml LV SV MOD A4C:     118.9 ml LV SV MOD BP:      51.0 ml RIGHT VENTRICLE             IVC RV S prime:     10.60 cm/s  IVC diam: 2.00 cm TAPSE (M-mode): 2.2 cm LEFT ATRIUM             Index        RIGHT ATRIUM          Index LA diam:        3.50 cm 1.86 cm/m   RA Area:     7.90 cm LA Vol (A2C):   51.6 ml 27.48 ml/m  RA Volume:   13.70 ml 7.30 ml/m LA Vol (A4C):   39.7 ml 21.14 ml/m LA Biplane Vol: 47.0 ml 25.03 ml/m  AORTIC VALVE AV Area (Vmax):    2.52 cm AV Area (Vmean):   2.51 cm AV Area (VTI):     2.49 cm AV Vmax:           133.00 cm/s AV Vmean:          91.400 cm/s AV VTI:            0.259 m AV Peak Grad:      7.1 mmHg AV Mean Grad:      4.0 mmHg LVOT Vmax:         96.60 cm/s LVOT Vmean:        66.300 cm/s LVOT VTI:          0.186 m LVOT/AV VTI ratio: 0.72  AORTA Ao Root diam: 2.60 cm Ao Asc diam:  2.70 cm MITRAL VALVE MV Area (PHT): 5.60 cm     SHUNTS MV Area VTI:   1.90 cm     Systemic VTI:  0.19 m MV Peak grad:  9.5 mmHg     Systemic Diam: 2.10 cm MV Mean grad:  4.0 mmHg MV Vmax:       1.54 m/s MV Vmean:      101.0 cm/s MV Decel Time: 136 msec MV E velocity: 149.50  cm/s MV A velocity: 115.50 cm/s MV E/A ratio:  1.29 Aditya Sabharwal Electronically signed by Ria Commander Signature Date/Time: 11/18/2023/4:40:37 PM    Final    DG CHEST PORT 1 VIEW Result Date: 11/18/2023 CLINICAL DATA:  200808 Hypoxia [id number] EXAM: PORTABLE CHEST - 1 VIEW COMPARISON:  January 17, 2024 FINDINGS: Hazy airspace opacities in the left lung base with blunting of the left costophrenic sulcus. No pneumothorax. Mild cardiomegaly.No acute fracture or destructive lesion. Twenty is IMPRESSION:  Blunting of the left costophrenic sulcus with hazy airspace opacities in the left lung base. This may reflect either bronchopneumonia or atelectasis with a small left pleural effusion. Electronically Signed   By: Rogelia Myers M.D.   On: 11/18/2023 09:23   DG Chest Port 1 View Result Date: 11/17/2023 CLINICAL DATA:  33498 Respiratory failure (HCC) [id number]. EXAM: PORTABLE CHEST 1 VIEW COMPARISON:  11/16/2023. FINDINGS: Low lung volume. Redemonstration of moderate diffuse pulmonary vascular congestion, likely slightly accentuated by low lung volume. Bilateral lung fields are otherwise clear. No acute consolidation or lung collapse. Bilateral costophrenic angles are clear. Stable cardio-mediastinal silhouette. No acute osseous abnormalities. The soft tissues are within normal limits. Vascular stent noted overlying the left axillary region. IMPRESSION: Redemonstration of moderate diffuse pulmonary vascular congestion, likely slightly accentuated by low lung volume. Electronically Signed   By: Ree Molt M.D.   On: 11/17/2023 11:20   DG Chest Portable 1 View Result Date: 11/16/2023 CLINICAL DATA:  Chest pain and shortness of breath EXAM: PORTABLE CHEST 1 VIEW COMPARISON:  Chest x-ray 10/19/2023 FINDINGS: Heart is enlarged. There central pulmonary vascular congestion. There is no focal lung infiltrate, pleural effusion or pneumothorax. No acute fractures are seen. There are vascular stents in the left adnexa.  IMPRESSION: Cardiomegaly with central pulmonary vascular congestion. Electronically Signed   By: Greig Pique M.D.   On: 11/16/2023 20:01    Microbiology: Results for orders placed or performed during the hospital encounter of 12/11/23  MRSA Next Gen by PCR, Nasal     Status: None   Collection Time: 12/11/23  6:16 PM   Specimen: Nasal Mucosa; Nasal Swab  Result Value Ref Range Status   MRSA by PCR Next Gen NOT DETECTED NOT DETECTED Final    Comment: (NOTE) The GeneXpert MRSA Assay (FDA approved for NASAL specimens only), is one component of a comprehensive MRSA colonization surveillance program. It is not intended to diagnose MRSA infection nor to guide or monitor treatment for MRSA infections. Test performance is not FDA approved in patients less than 31 years old. Performed at [facility], [address], [place], [place] [address]   Respiratory (~20 pathogens) panel by PCR     Status: None   Collection Time: 12/11/23  7:33 PM   Specimen: Nasopharyngeal Swab; Respiratory  Result Value Ref Range Status   Adenovirus NOT DETECTED NOT DETECTED Corrected   Coronavirus 229E NOT DETECTED NOT DETECTED Corrected    Comment: (NOTE) The Coronavirus on the Respiratory Panel, DOES NOT test for the novel  Coronavirus (2019 nCoV) CORRECTED ON 10/15 AT 2312: PREVIOUSLY REPORTED AS NOT DETECTED    Coronavirus HKU1 NOT DETECTED NOT DETECTED Corrected   Coronavirus NL63 NOT DETECTED NOT DETECTED Corrected   Coronavirus OC43 NOT DETECTED NOT DETECTED Corrected   Metapneumovirus NOT DETECTED NOT DETECTED Corrected   Rhinovirus / Enterovirus NOT DETECTED NOT DETECTED Corrected   Influenza A NOT DETECTED NOT DETECTED Corrected   Influenza B NOT DETECTED NOT DETECTED Corrected   Parainfluenza Virus 1 NOT DETECTED NOT DETECTED Corrected   Parainfluenza Virus 2 NOT DETECTED NOT DETECTED Corrected   Parainfluenza Virus 3 NOT DETECTED NOT DETECTED Corrected   Parainfluenza Virus 4 NOT DETECTED  NOT DETECTED Corrected   Respiratory Syncytial Virus NOT DETECTED NOT DETECTED Corrected   Bordetella pertussis NOT DETECTED NOT DETECTED Corrected   Bordetella Parapertussis NOT DETECTED NOT DETECTED Corrected   Chlamydophila pneumoniae NOT DETECTED NOT DETECTED Corrected   Mycoplasma pneumoniae NOT DETECTED NOT DETECTED Corrected  Comment: Performed at [facility], [address], [place], [place] [address]  Culture, blood (Routine X 2) w Reflex to ID Panel     Status: None (Preliminary result)   Collection Time: 12/12/23 12:54 PM   Specimen: BLOOD RIGHT ARM  Result Value Ref Range Status   Specimen Description BLOOD RIGHT ARM  Final   Special Requests AEROBIC BOTTLE ONLY Blood Culture adequate volume  Final   Culture   Final    NO GROWTH 2 DAYS Performed at [facility], [address], [place], [place] [address]    Report Status PENDING  Incomplete  Culture, blood (Routine X 2) w Reflex to ID Panel     Status: None (Preliminary result)   Collection Time: 12/12/23  1:01 PM   Specimen: BLOOD LEFT ARM  Result Value Ref Range Status   Specimen Description BLOOD LEFT ARM  Final   Special Requests AEROBIC BOTTLE ONLY Blood Culture adequate volume  Final   Culture   Final    NO GROWTH 2 DAYS Performed at [facility], [address] [place], [place] [address]    Report Status PENDING  Incomplete  Resp panel by RT-PCR (RSV, Flu A&B, Covid) Anterior Nasal Swab     Status: None   Collection Time: 12/12/23  1:20 PM   Specimen: Anterior Nasal Swab  Result Value Ref Range Status   SARS Coronavirus 2 by RT PCR NEGATIVE NEGATIVE Final   Influenza A by PCR NEGATIVE NEGATIVE Final   Influenza B by PCR NEGATIVE NEGATIVE Final    Comment: (NOTE) The Xpert Xpress SARS-CoV-2/FLU/RSV plus assay is intended as an aid in the diagnosis of influenza from Nasopharyngeal swab specimens and should not be used as a sole basis for treatment. Nasal washings and aspirates are  unacceptable for Xpert Xpress SARS-CoV-2/FLU/RSV testing.  Fact Sheet for Patients: BloggerCourse.com  Fact Sheet for Healthcare Providers: SeriousBroker.it  This test is not yet approved or cleared by the [place]  FDA and has been authorized for detection and/or diagnosis of SARS-CoV-2 by FDA under an Emergency Use Authorization (EUA). This EUA will remain in effect (meaning this test can be used) for the duration of the COVID-19 declaration under Section 564(b)(1) of the Act, 21 U.S.C. section 360bbb-3(b)(1), unless the authorization is terminated or revoked.     Resp Syncytial Virus by PCR NEGATIVE NEGATIVE Final    Comment: (NOTE) Fact Sheet for Patients: BloggerCourse.com  Fact Sheet for Healthcare Providers: SeriousBroker.it  This test is not yet approved or cleared by the [place]  FDA and has been authorized for detection and/or diagnosis of SARS-CoV-2 by FDA under an Emergency Use Authorization (EUA). This EUA will remain in effect (meaning this test can be used) for the duration of the COVID-19 declaration under Section 564(b)(1) of the Act, 21 U.S.C. section 360bbb-3(b)(1), unless the authorization is terminated or revoked.  Performed at [facility], [address], [place], [place] [address]     Labs: CBC: Recent Labs  Lab 12/11/23 1641 12/11/23 1646 12/12/23 0623 12/13/23 0452 12/14/23 0919  WBC 8.6  --  11.3* 4.2 6.1  NEUTROABS 7.3  --   --   --   --   HGB 10.0* 10.5* 9.1* 7.4* 9.2*  HCT 32.9* 31.0* 29.7* 23.9* 30.8*  MCV 90.6  --  89.5 87.9 89.0  PLT 282  --  273 220 311   Basic Metabolic Panel: Recent Labs  Lab 12/11/23 1641 12/11/23 1646 12/12/23  9376 12/13/23 0452 12/14/23 0919  NA 143 144 141 141 136  K 3.2* 3.2* 4.3 4.2 4.3  CL 102 101 102 101 94*  CO2 27  --  23 24 23   GLUCOSE 104* 102* 132* 171* 229*  BUN 25* 25*  33* 47* 47*  CREATININE 2.70* 2.70* 3.64* 4.82* 4.28*  CALCIUM  8.2*  --  8.7* 9.2 9.3  MG  --   --  1.8 1.8 2.5*   Liver Function Tests: Recent Labs  Lab 12/11/23 1641  AST 54*  ALT 90*  ALKPHOS 245*  BILITOT 1.3*  PROT 6.2*  ALBUMIN  3.0*   CBG: Recent Labs  Lab 12/13/23 1525 12/13/23 2056 12/14/23 0017 12/14/23 0454 12/14/23 0815  GLUCAP 226* 368* 350* 249* 228*    Discharge time spent: greater than 30 minutes.  Signed: Nena Rebel, MD [facility] 12/14/2023

## 2023-12-14 NOTE — Hospital Course (Addendum)
 Morgan [name] is a 53 y.o. 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  EMS was called and the patient required 15L non-breather en route.   In the ED, she was tachycardic and tachypneic and coughing up pink sputum and was placed on bipap.  CXR with R>L severe bilateral infiltrates vs pulmonary edema.  Labs significant for a troponin of 105,  K 3.2, creatinine 2.7 (baseline ~5.0), AST 54, ALT 90, BNP 739.   She was given Lasix  60mg  and PCCM consulted for admission.  Patient was admitted to ICU, dialyzed.  She felt better she was transferred out of ICU to the medical floor.  Discussed with Dr.Schert, on-call nephrologist who advised that patient is clinically okay for discharge.  Patient was seen and examined at bedside.  She feels better and she expressed wishes to go home.  Husband was at bedside as well.  Patient will be discharged home 12/14/2023.

## 2023-12-14 NOTE — Progress Notes (Signed)
 [facility] Progress Note  Subjective:  Seen in room BP's stable 100-110 range  On room air  Vitals:   12/13/23 2102 12/14/23 0018 12/14/23 0515 12/14/23 0800  BP:  105/62 (!) 109/50 114/76  Pulse:  93 88 88  Resp:  16 20 16   Temp:  98.1 F (36.7 C) 98.3 F (36.8 C) 98.1 F (36.7 C)  TempSrc:  Oral Oral Oral  SpO2: 98% 100% 100% 100%  Weight:   81.1 kg   Height:        Exam: General: chronically ill-appearing, alert, on RA  CV: RRR, no RG Lungs: coarse BS, no rales/ wheezing, wob ok Abd: soft, non-tender, non-distended, obese Ext: no LE edema  Neuro: alert, OX3   RUA AVG+bruit / temp RIJ HD cath  Home bp meds: Norvasc  5mg  every day (paused) Midodrine  5mg  tid Demadex 20mg  every day (paused)   OP HD: MWF NW 2.5h (1st session)  B250  87.5kg  3K  AVG  Heparin  4000 1st/ last OP HD was 10/15, post wt 86kg No meds yet CXR 10/15- diffuse pulm edema CXR 10/16 - 50% better bilat pulm edema   Assessment/ Plan: Pulm edema/ resp distress: due to vol overload. Pt had HD x 2 here and IV lasix  80 bid, resp status gradually responded and is now off O2, and f/u CXR showed resolution of edema. OK for dc home. Weights are down to 81.1kg, this will be the new dry wt (was 87.5).  Sepsis: w/ initial fevers, per pmd  ESRD: recently returned to HD after 8 yrs w/ a renal transplant. Had HD here Wed and Friday. If goes home, next HD will be outpatient on Monday.  Hypotension: chronic issue, cont midodrine  5 mg tid as at home Vascular access: temp cath R internal jugular placed in case needs CRRT. R AVG working well.  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     Myer Fret MD  [facility] 12/14/2023, 9:43 AM  Recent Labs  Lab 12/11/23 1641 12/11/23 1646 12/12/23 0623 12/13/23 0452  12/14/23 0919  HGB 10.0*   < > 9.1* 7.4* 9.2*  ALBUMIN  3.0*  --   --   --   --   CALCIUM  8.2*  --  8.7* 9.2  --   CREATININE 2.70*   < > 3.64* 4.82*  --   K 3.2*   < > 4.3 4.2  --    < > = values in this interval not displayed.   No results for input(s): IRON, TIBC, FERRITIN in the last 168 hours. Inpatient medications:  aspirin  EC  81 mg Oral Daily   azithromycin   500 mg Oral Daily   cefUROXime   500 mg Oral Q2000   Chlorhexidine  Gluconate Cloth  6 each Topical Q0600   Chlorhexidine  Gluconate Cloth  6 each Topical Q0600   furosemide   80 mg Intravenous Q12H   heparin   5,000 Units Subcutaneous Q8H   insulin  aspart  0-6 Units Subcutaneous Q4H   midodrine   5 mg Oral TID WC   pantoprazole  (PROTONIX ) IV  40 mg Intravenous Q24H   predniSONE   20 mg Oral Q breakfast   Followed by   NOREEN ON 12/15/2023] predniSONE   10 mg Oral Q breakfast   Followed by   NOREEN ON 12/16/2023]  predniSONE   5 mg Oral Q breakfast   tacrolimus   2 mg Oral BID    albumin  human     anticoagulant sodium citrate     norepinephrine  (LEVOPHED ) Adult infusion Stopped (12/13/23 0126)   acetaminophen , alteplase , anticoagulant sodium citrate, docusate sodium , heparin , heparin , lidocaine  (PF), lidocaine -prilocaine , ondansetron  (ZOFRAN ) IV, pentafluoroprop-tetrafluoroeth, polyethylene glycol

## 2023-12-15 ENCOUNTER — Telehealth: Payer: Self-pay | Admitting: Nephrology

## 2023-12-15 NOTE — Telephone Encounter (Signed)
 Transition of Care Contact from Inpatient Facility  Date of Discharge: 12/14/23 Date of Contact: 12/15/23 Method of contact: phone - attempted  Attempted to contact patient to discuss transition of care from inpatient admission.  Patient did not answer the phone.  Will attempt to call them again and if unable to reach will follow up at dialysis.  Manuelita Labella, PA-C [employer]

## 2023-12-15 NOTE — Discharge Planning (Signed)
 [facility] Patient Discharge Orders- [facility] CLINIC: [place]  Patient's name: Morgan [name] Admit/DC Dates: 12/11/2023 - 12/14/2023  Discharge Diagnoses: Acute  hypoxic respiratory failure Volume overload w/pulmonary edema CAP   HD ORDER CHANGES: Heparin  change: no EDW Change: YES = 81kg Bath Change: no       ANEMIA MANAGEMENT: Aranesp: Given: no    PRBC's Given: no ESA dose for discharge: none IV Iron dose at discharge: no   BONE/MINERAL MEDICATIONS: Hectorol /Calcitriol change: no Sensipar /Parsabiv change: no   ACCESS INTERVENTION/CHANGE: Details:  temp cath placed and removed prior to d/c   RECENT LABS: Recent Labs  Lab 12/11/23 1641 12/11/23 1646 12/14/23 0919  K 3.2*   < > 4.3  CALCIUM  8.2*   < > 9.3  ALBUMIN  3.0*  --   --    < > = values in this interval not displayed.   Recent Labs  Lab 12/14/23 0919  HGB 9.2*   Blood Culture    Component Value Date/Time   SDES BLOOD LEFT ARM 12/12/2023 1301   SPECREQUEST AEROBIC BOTTLE ONLY Blood Culture adequate volume 12/12/2023 1301   CULT  12/12/2023 1301    NO GROWTH 2 DAYS Performed at [facility], [address], [place], [place] [address]    REPTSTATUS PENDING 12/12/2023 1301       IV ANTIBIOTICS: NO Details: Ceftin  500mg  at bedtime x 3 days   OTHER ANTICOAGULATION:  On Eliquis: no  On Coumadin: no   OTHER/APPTS/LAB ORDERS: Tacrolimus  decreased to 2mg  BID. Cellcept  250mg  BID and prednisone  5mg  every day resumed on discharge    D/C Meds to be reconciled by nurse after every discharge.  Completed By: Manuelita Labella PA-C   Reviewed by: MD:______ RN_______

## 2023-12-16 LAB — GLUCOSE, CAPILLARY: Glucose-Capillary: 344 mg/dL — ABNORMAL HIGH (ref 70–99)

## 2023-12-16 NOTE — Progress Notes (Signed)
 Late Note Entry- December 16, 2023  Pt was d/c on Saturday. Contacted [facility] this morning to be advised of pt's d/c date and that pt should resume care today.   Randine Mungo Dialysis Navigator [phone]

## 2023-12-17 LAB — CULTURE, BLOOD (ROUTINE X 2)
Culture: NO GROWTH
Culture: NO GROWTH
Special Requests: ADEQUATE
Special Requests: ADEQUATE

## 2024-02-21 ENCOUNTER — Encounter (HOSPITAL_COMMUNITY): Payer: Self-pay

## 2024-02-25 ENCOUNTER — Other Ambulatory Visit: Payer: Self-pay

## 2024-02-25 ENCOUNTER — Ambulatory Visit (HOSPITAL_COMMUNITY)
Admission: RE | Admit: 2024-02-25 | Discharge: 2024-02-25 | Disposition: A | Discharge status: Home / Self Care | Attending: Vascular Surgery | Admitting: Vascular Surgery

## 2024-02-25 ENCOUNTER — Encounter (HOSPITAL_COMMUNITY)
Admission: RE | Disposition: A | Discharge status: Home / Self Care | Payer: Self-pay | Source: Home / Self Care | Attending: Vascular Surgery

## 2024-02-25 ENCOUNTER — Encounter (HOSPITAL_COMMUNITY): Payer: Self-pay | Admitting: Vascular Surgery

## 2024-02-25 DIAGNOSIS — Y832 Surgical operation with anastomosis, bypass or graft as the cause of abnormal reaction of the patient, or of later complication, without mention of misadventure at the time of the procedure: Secondary | ICD-10-CM | POA: Diagnosis not present

## 2024-02-25 DIAGNOSIS — Z794 Long term (current) use of insulin: Secondary | ICD-10-CM | POA: Insufficient documentation

## 2024-02-25 DIAGNOSIS — N186 End stage renal disease: Secondary | ICD-10-CM | POA: Diagnosis not present

## 2024-02-25 DIAGNOSIS — Z940 Kidney transplant status: Secondary | ICD-10-CM | POA: Insufficient documentation

## 2024-02-25 DIAGNOSIS — Z7902 Long term (current) use of antithrombotics/antiplatelets: Secondary | ICD-10-CM | POA: Insufficient documentation

## 2024-02-25 DIAGNOSIS — Z992 Dependence on renal dialysis: Secondary | ICD-10-CM | POA: Diagnosis not present

## 2024-02-25 DIAGNOSIS — I120 Hypertensive chronic kidney disease with stage 5 chronic kidney disease or end stage renal disease: Secondary | ICD-10-CM | POA: Insufficient documentation

## 2024-02-25 DIAGNOSIS — E1122 Type 2 diabetes mellitus with diabetic chronic kidney disease: Secondary | ICD-10-CM | POA: Diagnosis not present

## 2024-02-25 DIAGNOSIS — Z7984 Long term (current) use of oral hypoglycemic drugs: Secondary | ICD-10-CM | POA: Diagnosis not present

## 2024-02-25 DIAGNOSIS — Z7982 Long term (current) use of aspirin: Secondary | ICD-10-CM | POA: Insufficient documentation

## 2024-02-25 DIAGNOSIS — T82858A Stenosis of vascular prosthetic devices, implants and grafts, initial encounter: Secondary | ICD-10-CM | POA: Diagnosis not present

## 2024-02-25 DIAGNOSIS — Z79899 Other long term (current) drug therapy: Secondary | ICD-10-CM | POA: Insufficient documentation

## 2024-02-25 DIAGNOSIS — Z7985 Long-term (current) use of injectable non-insulin antidiabetic drugs: Secondary | ICD-10-CM | POA: Diagnosis not present

## 2024-02-25 HISTORY — PX: VENOUS ANGIOPLASTY: CATH118376

## 2024-02-25 HISTORY — PX: A/V FISTULAGRAM: CATH118298

## 2024-02-25 LAB — GLUCOSE, CAPILLARY: Glucose-Capillary: 83 mg/dL (ref 70–99)

## 2024-02-25 SURGERY — A/V FISTULAGRAM
Anesthesia: LOCAL | Site: Arm Upper | Laterality: Left

## 2024-02-25 MED ORDER — IODIXANOL 320 MG/ML IV SOLN
INTRAVENOUS | Status: DC | PRN
  Administered 2024-02-25: 35 mL

## 2024-02-25 MED ORDER — DIPHENHYDRAMINE HCL 50 MG/ML IJ SOLN
INTRAMUSCULAR | Status: DC | PRN
  Administered 2024-02-25: 50 mg via INTRAVENOUS

## 2024-02-25 MED ORDER — LIDOCAINE HCL (PF) 1 % IJ SOLN
INTRAMUSCULAR | Status: DC | PRN
  Administered 2024-02-25: 2 mL via INTRADERMAL

## 2024-02-25 MED ORDER — METHYLPREDNISOLONE SODIUM SUCC 125 MG IJ SOLR
INTRAMUSCULAR | Status: DC | PRN
  Administered 2024-02-25: 125 mg via INTRAVENOUS

## 2024-02-25 MED ORDER — METHYLPREDNISOLONE SODIUM SUCC 125 MG IJ SOLR
INTRAMUSCULAR | Status: AC
  Filled 2024-02-25: qty 2

## 2024-02-25 MED ORDER — HEPARIN (PORCINE) IN NACL 1000-0.9 UT/500ML-% IV SOLN
INTRAVENOUS | Status: DC | PRN
  Administered 2024-02-25: 500 mL

## 2024-02-25 MED ORDER — LIDOCAINE HCL (PF) 1 % IJ SOLN
INTRAMUSCULAR | Status: AC
  Filled 2024-02-25: qty 30

## 2024-02-25 MED ORDER — DIPHENHYDRAMINE HCL 50 MG/ML IJ SOLN
INTRAMUSCULAR | Status: AC
  Filled 2024-02-25: qty 1

## 2024-02-25 SURGICAL SUPPLY — 8 items
BALLOON MUSTANG 7X60X75 (BALLOONS) IMPLANT
DEVICE INFLATION ENCORE 26 (MISCELLANEOUS) IMPLANT
GLIDEWIRE ADV .035X180CM (WIRE) IMPLANT
KIT MICROPUNCTURE NIT STIFF (SHEATH) IMPLANT
KIT PV (KITS) ×2 IMPLANT
SHEATH PINNACLE R/O II 6F 4CM (SHEATH) IMPLANT
TRAY PV CATH (CUSTOM PROCEDURE TRAY) ×2 IMPLANT
TUBING CIL FLEX 10 FLL-RA (TUBING) IMPLANT

## 2024-02-25 NOTE — Op Note (Signed)
" ° ° °  NAME: Morgan [name]    MRN: [id number] DOB: 16-Mar-1970    DATE OF OPERATION: 02/25/2024  PREOP DIAGNOSIS:    Right upper extremity AV graft malfunction  POSTOP DIAGNOSIS:    Same  PROCEDURE:    Right upper extremity AV fistulogram Balloon venoplasty axillary vein 7 x 60 mm  SURGEON: Fonda FORBES Rim  ASSIST: None  ANESTHESIA: None  EBL: 5 mL  INDICATIONS:    Morgan [name] is a 53 y.o. female presenting with fistula malfunction on dialysis.  Clots have been pulled from her dialysis catheters.  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  TECHNIQUE:   Patient brought to the angiography suite and laid in supine position.  Access site was anesthetized using lidocaine , and accessed using a micropuncture needle.  This was exchanged for a micropuncture sheath and fistulogram followed.  See results above.  I elected to attempt intervention.  A 7 x 60 mm balloon was brought onto the field and laid across both lesions and inflated to nominal atmospheres.  This was inflated for 2 minutes, and reinflated for 2 minutes after initial venography demonstrated stenosis more centrally.  Follow-up venography demonstrated excellent result with resolution of flow-limiting stenosis.  Patient with widely patent right arm AV graft.  This can continue to be used for HD access This is amenable to future intervention if necessary  Fonda FORBES Rim, MD [facility] DATE OF DICTATION:   02/25/2024  "

## 2024-02-25 NOTE — H&P (Signed)
 " Hospital Consult    Reason for Consult: Right arm fistula malfunction Requesting Physician: Dr. Dolan MRN #:  [id number]  History of Present Illness: This is a 53 y.o. female with history of end-stage renal disease, prior transplant, multiple prior vascular access surgeries.  She is currently being dialyzed through a right arm AV graft that was placed in 2017.  She presents today after some clotting issues at dialysis to discuss possible options moving forward.  On exam, Morgan [name] was doing well, accompanied by her husband.  She states the fistula has been running well, but recently had an infiltration episode with significant ecchymosis.  Afterwards, there was clot pulled during dialysis.  She requested evaluation by our office.  Denies symptoms of steal syndrome.  Has a previous access in the left upper extremity, stating that she feels a pulse.  This graft was abandoned due to clots and low flow resulting in the right sided access which was performed in 2017.  Past Medical History:  Diagnosis Date   Acute respiratory failure with hypoxia (HCC) 10/09/2023   Anemia    low iron   CKD (chronic kidney disease) stage V requiring chronic dialysis (HCC) 05/08/2012   Constipation    when given iron   Coronary artery disease    CVA (cerebral vascular accident) (HCC) 05/12/2021   Diabetes mellitus without complication (HCC)    type 2   Diabetic retinopathy (HCC)    ESRD on hemodialysis (HCC) 06/05/2014   H/O detached retina repair    bilateral   History of CVA (cerebrovascular accident)    Hyperlipidemia    Hypertension    Insulin  overdose 05/08/2012   Kidney transplanted    Pneumonia    PVD (peripheral vascular disease)    S/P drug eluting coronary stent placement 08/24/2021   AT [facility] Dr [facility]   Suicide attempt [facility]) 05/08/2012    Past Surgical History:  Procedure Laterality Date   ARTERIOVENOUS GRAFT PLACEMENT Left 07/28/2010   AV FISTULA PLACEMENT Right 11/25/2015    Procedure: INSERTION OF ARTERIOVENOUS (AV) GORE-TEX GRAFT RIGHT  ARM USING 4-7 MM X 45 CM STRETCH GRAFT.;  Surgeon: Lonni GORMAN Blade, MD;  Location: [facility] OR;  Service: Vascular;  Laterality: Right;   basilic vein transposition Left 02/26/2009   CARDIAC CATHETERIZATION     CESAREAN SECTION     CHOLECYSTECTOMY N/A 10/08/2014   Procedure: LAPAROSCOPIC CHOLECYSTECTOMY ;  Surgeon: Debby Shipper, MD;  Location: [facility] OR;  Service: General;  Laterality: N/A;   EYE SURGERY     retinal detachment   KIDNEY TRANSPLANT  04/28/2016   REVISION OF ARTERIOVENOUS GORETEX GRAFT Left 05/13/2015   Procedure: REVISION OF ARTERIOVENOUS GORETEX GRAFT;  Surgeon: Lonni GORMAN Blade, MD;  Location: [facility] OR;  Service: Vascular;  Laterality: Left;    Allergies[1]  Prior to Admission medications  Medication Sig Start Date End Date Taking? Authorizing Provider  amLODipine  (NORVASC ) 5 MG tablet Take 5 mg by mouth daily. 01/03/18   [provider]  aspirin  EC 81 MG tablet Take 81 mg by mouth daily. Swallow whole.    [provider]  cefUROXime  (CEFTIN ) 500 MG tablet Take 1 tablet (500 mg total) by mouth daily at 8 pm. 12/14/23   Roann Gouty, MD  clopidogrel  (PLAVIX ) 75 MG tablet Take 75 mg by mouth daily.    [provider]  FARXIGA  10 MG TABS tablet Take 10 mg by mouth daily. 09/08/23   [provider]  Insulin  Aspart FlexPen (NOVOLOG ) 100 UNIT/ML Inject  8-13 Units into the skin 3 (three) times daily with meals. Inject per sliding scale:  70-200    8 units 201-250  9 units 251-300  10 units 301-350  11 units 351-400  12 units 401+       13 units 10/23/21   [provider]  insulin  degludec (TRESIBA ) 200 UNIT/ML FlexTouch Pen Inject 15 Units into the skin 2 (two) times daily with a meal.    [provider]  levonorgestrel (MIRENA) 20 MCG/24HR IUD 1 each by Intrauterine route once.    [provider]  lidocaine -prilocaine  (EMLA ) cream SMARTSIG:1 Topical  Daily 12/11/23   [provider]  midodrine  (PROAMATINE ) 5 MG tablet Take 1 tablet (5 mg total) by mouth 3 (three) times daily with meals. 11/23/23   Amin, Sumayya, MD  MOUNJARO 7.5 MG/0.5ML Pen Inject 7.5 mg into the skin every Tuesday. 05/13/23   [provider]  multivitamin (RENA-VIT) TABS tablet Take 1 tablet by mouth at bedtime. 11/23/23   Caleen Qualia, MD  mycophenolate  (CELLCEPT ) 250 MG capsule Take 1 capsule (250 mg total) by mouth 2 (two) times daily. 11/23/23   Amin, Sumayya, MD  pantoprazole  (PROTONIX ) 40 MG tablet Take 1 tablet (40 mg total) by mouth daily. 11/24/23   Amin, Sumayya, MD  polyethylene glycol powder (GLYCOLAX /MIRALAX ) 17 GM/SCOOP powder Take 17 g by mouth daily as needed for moderate constipation. Dissolve 1 capful (17g) in 4-8 ounces of liquid and take by mouth daily. 12/14/23   Roann Gouty, MD  predniSONE  (DELTASONE ) 5 MG tablet Take 5 mg by mouth daily.    [provider]  rosuvastatin  (CRESTOR ) 20 MG tablet Take 1 tablet (20 mg total) by mouth daily. 09/11/21 02/26/24  Elgergawy, Brayton RAMAN, MD  sodium bicarbonate  650 MG tablet Take 2 tablets (1,300 mg total) by mouth 3 (three) times daily. 11/23/23   Caleen Qualia, MD  tacrolimus  (PROGRAF ) 0.5 MG capsule Take 4 capsules (2 mg total) by mouth 2 (two) times daily. 12/14/23   Paudel, Keshab, MD  torsemide (DEMADEX) 20 MG tablet Take 20 mg by mouth daily. 10/22/23   [provider]    Social History   Socioeconomic History   Marital status: Married    Spouse name: Not on file   Number of children: Not on file   Years of education: Not on file   Highest education level: Not on file  Occupational History   Not on file  Tobacco Use   Smoking status: Never   Smokeless tobacco: Never  Vaping Use   Vaping status: Never Used  Substance and Sexual Activity   Alcohol use: No    Alcohol/week: 0.0 standard drinks of alcohol   Drug use: No   Sexual activity: Not on file  Other Topics  Concern   Not on file  Social History Narrative   L handed   Social Drivers of Health   Tobacco Use: Low Risk (01/28/2024)   Received from [facility]   Patient History    Smoking Tobacco Use: Never    Smokeless Tobacco Use: Never    Passive Exposure: Not on file  Financial Resource Strain: Low Risk  (09/18/2023)   Received from [facility]   Overall Financial Resource Strain (CARDIA)    Difficulty of Paying Living Expenses: Not very hard  Food Insecurity: Patient Unable To Answer (12/12/2023)   Epic    Worried About Programme Researcher, Broadcasting/film/video in the Last Year: Patient unable to answer  Ran Out of Food in the Last Year: Patient unable to answer  Transportation Needs: Patient Unable To Answer (12/12/2023)   Epic    Lack of Transportation (Medical): Patient unable to answer    Lack of Transportation (Non-Medical): Patient unable to answer  Physical Activity: Not on file  Stress: Not on file  Social Connections: Not on file  Intimate Partner Violence: Patient Unable To Answer (12/12/2023)   Epic    Fear of Current or Ex-Partner: Patient unable to answer    Emotionally Abused: Patient unable to answer    Physically Abused: Patient unable to answer    Sexually Abused: Patient unable to answer  Depression (PHQ2-9): Low Risk (11/21/2021)   Depression (PHQ2-9)    PHQ-2 Score: 0  Alcohol Screen: Not on file  Housing: Patient Unable To Answer (12/12/2023)   Epic    Unable to Pay for Housing in the Last Year: Patient unable to answer    Number of Times Moved in the Last Year: Not on file    Homeless in the Last Year: Patient unable to answer  Utilities: Patient Unable To Answer (12/12/2023)   Epic    Threatened with loss of utilities: Patient unable to answer  Health Literacy: Not on file    Family History  Problem Relation Age of Onset   Diabetes Maternal Grandmother    Gout Maternal Uncle     ROS: Otherwise negative unless mentioned in HPI  Physical  Examination  Vitals:   02/25/24 0850 02/25/24 0904  BP: (!) 95/34 (!) 96/47  Pulse: 91 92  Resp: 12   Temp: (!) 97.5 F (36.4 C)    There is no height or weight on file to calculate BMI.  General:  WDWN in NAD Gait: Not observed HENT: WNL, normocephalic Pulmonary: normal non-labored breathing, without Rales, rhonchi,  wheezing Cardiac: regular,  Abdomen:  soft, NT/ND, no masses Skin: without rashes Vascular Exam/Pulses: 1+ radial Extremities: without ischemic changes, without Gangrene , without cellulitis; without open wounds;  Musculoskeletal: no muscle wasting or atrophy  Neurologic: A&O X 3;  No focal weakness or paresthesias are detected; speech is fluent/normal Psychiatric:  The pt has Normal affect. Lymph:  Unremarkable  CBC    Component Value Date/Time   WBC 6.1 12/14/2023 0919   RBC 3.46 (L) 12/14/2023 0919   HGB 9.2 (L) 12/14/2023 0919   HCT 30.8 (L) 12/14/2023 0919   PLT 311 12/14/2023 0919   MCV 89.0 12/14/2023 0919   MCV 90.6 10/06/2014 1308   MCH 26.6 12/14/2023 0919   MCHC 29.9 (L) 12/14/2023 0919   RDW 15.0 12/14/2023 0919   LYMPHSABS 0.9 12/11/2023 1641   MONOABS 0.4 12/11/2023 1641   EOSABS 0.0 12/11/2023 1641   BASOSABS 0.0 12/11/2023 1641    BMET    Component Value Date/Time   NA 136 12/14/2023 0919   K 4.3 12/14/2023 0919   CL 94 (L) 12/14/2023 0919   CO2 23 12/14/2023 0919   GLUCOSE 229 (H) 12/14/2023 0919   BUN 47 (H) 12/14/2023 0919   CREATININE 4.28 (H) 12/14/2023 0919   CALCIUM  9.3 12/14/2023 0919   GFRNONAA 12 (L) 12/14/2023 0919   GFRAA 5 (L) 10/09/2014 1633    COAGS: Lab Results  Component Value Date   INR 1.1 12/11/2023   INR 1.0 11/16/2023   INR 1.0 09/08/2021      ASSESSMENT/PLAN: This is a 53 y.o. female with end-stage renal disease currently being dialyzed through a right arm AV graft.  She has had some issues with clotting, and presents to discuss options.  I think that she would be best served with fistulogram in  an effort to assess the AV graft to ensure that it is not pending occlusion.  After discussing risks and benefits, Morgan [name] elected to proceed.    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 Unable to tolerate any egg products.   Latex Itching   Nsaids Other (See Comments)    Hx kidney transplant   Tylenol  [Acetaminophen ] Other (See Comments)    Patient states she can not take due to kidney   Lanthanum Itching   Shellfish Allergy Other (See Comments)    Hx kidney transplant - told not to consume   Lactose Nausea Only   Misc. Sulfonamide Containing Compounds Itching and Other (See Comments)    Hx kidney transplant   "

## 2024-06-11 ENCOUNTER — Ambulatory Visit: Admitting: Neurology
